# Patient Record
Sex: Male | Born: 1945
Health system: Southern US, Community
[De-identification: ages and names within clinical notes are randomized; demographics above are authoritative.]

## PROBLEM LIST (undated history)

## (undated) DIAGNOSIS — J189 Pneumonia, unspecified organism: Secondary | ICD-10-CM

## (undated) DIAGNOSIS — J302 Other seasonal allergic rhinitis: Secondary | ICD-10-CM

## (undated) DIAGNOSIS — E785 Hyperlipidemia, unspecified: Secondary | ICD-10-CM

## (undated) DIAGNOSIS — R7303 Prediabetes: Secondary | ICD-10-CM

## (undated) DIAGNOSIS — E041 Nontoxic single thyroid nodule: Secondary | ICD-10-CM

## (undated) DIAGNOSIS — F1010 Alcohol abuse, uncomplicated: Secondary | ICD-10-CM

## (undated) DIAGNOSIS — E669 Obesity, unspecified: Secondary | ICD-10-CM

## (undated) DIAGNOSIS — I499 Cardiac arrhythmia, unspecified: Secondary | ICD-10-CM

## (undated) DIAGNOSIS — H9190 Unspecified hearing loss, unspecified ear: Secondary | ICD-10-CM

## (undated) DIAGNOSIS — M1990 Unspecified osteoarthritis, unspecified site: Secondary | ICD-10-CM

## (undated) DIAGNOSIS — T7840XA Allergy, unspecified, initial encounter: Secondary | ICD-10-CM

## (undated) DIAGNOSIS — H269 Unspecified cataract: Secondary | ICD-10-CM

## (undated) DIAGNOSIS — G4730 Sleep apnea, unspecified: Secondary | ICD-10-CM

## (undated) DIAGNOSIS — C801 Malignant (primary) neoplasm, unspecified: Secondary | ICD-10-CM

## (undated) DIAGNOSIS — R600 Localized edema: Secondary | ICD-10-CM

## (undated) DIAGNOSIS — K219 Gastro-esophageal reflux disease without esophagitis: Secondary | ICD-10-CM

## (undated) DIAGNOSIS — I10 Essential (primary) hypertension: Secondary | ICD-10-CM

## (undated) DIAGNOSIS — N400 Enlarged prostate without lower urinary tract symptoms: Secondary | ICD-10-CM

## (undated) DIAGNOSIS — Z9289 Personal history of other medical treatment: Secondary | ICD-10-CM

## (undated) DIAGNOSIS — K648 Other hemorrhoids: Secondary | ICD-10-CM

## (undated) DIAGNOSIS — M19011 Primary osteoarthritis, right shoulder: Secondary | ICD-10-CM

## (undated) DIAGNOSIS — D3613 Benign neoplasm of peripheral nerves and autonomic nervous system of lower limb, including hip: Secondary | ICD-10-CM

## (undated) DIAGNOSIS — E88819 Insulin resistance, unspecified: Secondary | ICD-10-CM

## (undated) DIAGNOSIS — M549 Dorsalgia, unspecified: Secondary | ICD-10-CM

## (undated) DIAGNOSIS — K5730 Diverticulosis of large intestine without perforation or abscess without bleeding: Secondary | ICD-10-CM

## (undated) DIAGNOSIS — Z8709 Personal history of other diseases of the respiratory system: Secondary | ICD-10-CM

## (undated) DIAGNOSIS — R634 Abnormal weight loss: Secondary | ICD-10-CM

## (undated) DIAGNOSIS — R251 Tremor, unspecified: Secondary | ICD-10-CM

## (undated) DIAGNOSIS — K635 Polyp of colon: Secondary | ICD-10-CM

## (undated) DIAGNOSIS — R002 Palpitations: Secondary | ICD-10-CM

## (undated) DIAGNOSIS — M169 Osteoarthritis of hip, unspecified: Secondary | ICD-10-CM

## (undated) DIAGNOSIS — M19112 Post-traumatic osteoarthritis, left shoulder: Secondary | ICD-10-CM

## (undated) DIAGNOSIS — E8881 Metabolic syndrome: Secondary | ICD-10-CM

## (undated) HISTORY — DX: Personal history of other medical treatment: Z92.89

## (undated) HISTORY — DX: Insulin resistance, unspecified: E88.819

## (undated) HISTORY — PX: COLONOSCOPY: SHX174

## (undated) HISTORY — DX: Obesity, unspecified: E66.9

## (undated) HISTORY — DX: Benign neoplasm of peripheral nerves and autonomic nervous system of lower limb, including hip: D36.13

## (undated) HISTORY — DX: Other hemorrhoids: K64.8

## (undated) HISTORY — DX: Alcohol abuse, uncomplicated: F10.10

## (undated) HISTORY — DX: Unspecified cataract: H26.9

## (undated) HISTORY — DX: Polyp of colon: K63.5

## (undated) HISTORY — DX: Dorsalgia, unspecified: M54.9

## (undated) HISTORY — DX: Allergy, unspecified, initial encounter: T78.40XA

## (undated) HISTORY — DX: Sleep apnea, unspecified: G47.30

## (undated) HISTORY — DX: Gastro-esophageal reflux disease without esophagitis: K21.9

## (undated) HISTORY — DX: Unspecified hearing loss, unspecified ear: H91.90

## (undated) HISTORY — DX: Metabolic syndrome: E88.81

## (undated) HISTORY — DX: Unspecified osteoarthritis, unspecified site: M19.90

## (undated) HISTORY — DX: Palpitations: R00.2

## (undated) HISTORY — DX: Hyperlipidemia, unspecified: E78.5

## (undated) HISTORY — DX: Diverticulosis of large intestine without perforation or abscess without bleeding: K57.30

## (undated) HISTORY — PX: JOINT REPLACEMENT: SHX530

## (undated) HISTORY — DX: Localized edema: R60.0

## (undated) HISTORY — PX: TONSILLECTOMY: SUR1361

## (undated) HISTORY — PX: POLYPECTOMY: SHX149

---

## 2004-09-10 ENCOUNTER — Encounter: Payer: Self-pay | Admitting: Family Medicine

## 2004-10-16 ENCOUNTER — Encounter: Payer: Self-pay | Admitting: Family Medicine

## 2004-10-29 ENCOUNTER — Encounter: Payer: Self-pay | Admitting: Family Medicine

## 2007-03-23 HISTORY — PX: SHOULDER SURGERY: SHX246

## 2007-12-18 ENCOUNTER — Encounter: Payer: Self-pay | Admitting: Family Medicine

## 2007-12-18 LAB — HM COLONOSCOPY

## 2008-02-07 LAB — HM COLONOSCOPY

## 2008-05-09 ENCOUNTER — Encounter: Payer: Self-pay | Admitting: Family Medicine

## 2009-09-04 ENCOUNTER — Ambulatory Visit: Payer: Self-pay | Admitting: Family Medicine

## 2009-09-04 DIAGNOSIS — H9190 Unspecified hearing loss, unspecified ear: Secondary | ICD-10-CM | POA: Insufficient documentation

## 2009-09-04 DIAGNOSIS — K648 Other hemorrhoids: Secondary | ICD-10-CM | POA: Insufficient documentation

## 2009-09-04 DIAGNOSIS — G4730 Sleep apnea, unspecified: Secondary | ICD-10-CM | POA: Insufficient documentation

## 2009-09-04 DIAGNOSIS — K5730 Diverticulosis of large intestine without perforation or abscess without bleeding: Secondary | ICD-10-CM | POA: Insufficient documentation

## 2009-09-04 DIAGNOSIS — M1990 Unspecified osteoarthritis, unspecified site: Secondary | ICD-10-CM | POA: Insufficient documentation

## 2009-09-04 DIAGNOSIS — J301 Allergic rhinitis due to pollen: Secondary | ICD-10-CM | POA: Insufficient documentation

## 2009-09-04 DIAGNOSIS — K219 Gastro-esophageal reflux disease without esophagitis: Secondary | ICD-10-CM | POA: Insufficient documentation

## 2009-09-04 LAB — CONVERTED CEMR LAB
Bilirubin Urine: NEGATIVE
Blood in Urine, dipstick: NEGATIVE
Glucose, Urine, Semiquant: NEGATIVE
Ketones, urine, test strip: NEGATIVE
Nitrite: NEGATIVE
Protein, U semiquant: NEGATIVE
Specific Gravity, Urine: <1.005
Urobilinogen, UA: NEGATIVE
WBC Urine, dipstick: NEGATIVE
pH: 7.5

## 2009-09-08 LAB — CONVERTED CEMR LAB
ALT: 17 units/L (ref 0–53)
AST: 17 units/L (ref 0–37)
Albumin: 4.5 g/dL (ref 3.5–5.2)
Alkaline Phosphatase: 72 units/L (ref 39–117)
BUN: 15 mg/dL (ref 6–23)
Basophils Absolute: 0 10*3/uL (ref 0.0–0.1)
Basophils Relative: 0.1 % (ref 0.0–3.0)
Bilirubin, Direct: 0.2 mg/dL (ref 0.0–0.3)
CO2: 30 meq/L (ref 19–32)
Calcium: 9.5 mg/dL (ref 8.4–10.5)
Chloride: 104 meq/L (ref 96–112)
Cholesterol: 126 mg/dL (ref 0–200)
Creatinine, Ser: 0.9 mg/dL (ref 0.4–1.5)
Eosinophils Absolute: 0.1 10*3/uL (ref 0.0–0.7)
Eosinophils Relative: 0.9 % (ref 0.0–5.0)
GFR calc non Af Amer: 87.12 mL/min (ref >60)
Glucose, Bld: 81 mg/dL (ref 70–99)
HCT: 45.1 % (ref 39.0–52.0)
HDL: 41.8 mg/dL (ref >39.00)
Hemoglobin: 15.3 g/dL (ref 13.0–17.0)
LDL Cholesterol: 72 mg/dL (ref 0–99)
Lymphocytes Relative: 26.5 % (ref 12.0–46.0)
Lymphs Abs: 1.9 10*3/uL (ref 0.7–4.0)
MCHC: 34 g/dL (ref 30.0–36.0)
MCV: 91.2 fL (ref 78.0–100.0)
Monocytes Absolute: 0.7 10*3/uL (ref 0.1–1.0)
Monocytes Relative: 9.1 % (ref 3.0–12.0)
Neutro Abs: 4.5 10*3/uL (ref 1.4–7.7)
Neutrophils Relative %: 63.4 % (ref 43.0–77.0)
PSA: 0.8 ng/mL (ref 0.10–4.00)
Platelets: 187 10*3/uL (ref 150.0–400.0)
Potassium: 4.1 meq/L (ref 3.5–5.1)
RBC: 4.95 M/uL (ref 4.22–5.81)
RDW: 14 % (ref 11.5–14.6)
Sodium: 142 meq/L (ref 135–145)
TSH: 0.53 microintl units/mL (ref 0.35–5.50)
Total Bilirubin: 1.1 mg/dL (ref 0.3–1.2)
Total CHOL/HDL Ratio: 3
Total Protein: 7.2 g/dL (ref 6.0–8.3)
Triglycerides: 61 mg/dL (ref 0.0–149.0)
VLDL: 12.2 mg/dL (ref 0.0–40.0)
WBC: 7.1 10*3/uL (ref 4.5–10.5)

## 2009-10-23 ENCOUNTER — Telehealth: Payer: Self-pay | Admitting: Family Medicine

## 2009-10-24 ENCOUNTER — Telehealth (INDEPENDENT_AMBULATORY_CARE_PROVIDER_SITE_OTHER): Payer: Self-pay | Admitting: *Deleted

## 2009-10-27 ENCOUNTER — Telehealth (INDEPENDENT_AMBULATORY_CARE_PROVIDER_SITE_OTHER): Payer: Self-pay | Admitting: *Deleted

## 2009-10-31 ENCOUNTER — Encounter: Payer: Self-pay | Admitting: Family Medicine

## 2010-04-21 NOTE — Progress Notes (Signed)
Summary: REFILL REQUEST  Phone Note Refill Request Call back at [phone] Message from:  Pharmacy on October 23, 2009 3:26 PM  Refills Requested: Medication #1:  MOBIC 15 MG TABS 1 by mouth once daily.   Dosage confirmed as above?Dosage Confirmed   Supply Requested: 3 months   Last Refilled: 06/18/2009  Medication #2:  LIPITOR 40 MG TABS 1 by mouth daily   Dosage confirmed as above?Dosage Confirmed   Supply Requested: 3 months   Last Refilled: 06/18/2009  Medication #3:  NEXIUM 40 MG CPDR 1 by mouth daily   Dosage confirmed as above?Dosage Confirmed   Supply Requested: 3 months   Last Refilled: 07/19/2009 [facility] [address] [place]   Next Appointment Scheduled: NONE Initial call taken by: Lavell Islam,  October 23, 2009 3:30 PM    Prescriptions: MOBIC 15 MG TABS (MELOXICAM) 1 by mouth once daily  #30 x 5   Entered by:   Shonna Chock CMA   Authorized by:   Loreen Freud DO   Signed by:   Shonna Chock CMA on 10/24/2009   Method used:   Electronically to        [facility]* (retail)       [address]       [place], [place]  [address]       Ph: [phone]       Fax: [phone]   RxID:   [id number] NEXIUM 40 MG CPDR (ESOMEPRAZOLE MAGNESIUM) 1 by mouth daily  #30 x 11   Entered by:   Shonna Chock CMA   Authorized by:   Loreen Freud DO   Signed by:   Shonna Chock CMA on 10/24/2009   Method used:   Electronically to        [facility]* (retail)       [address]       [place], [place]  [address]       Ph: [phone]       Fax: [phone]   RxID:   [id number] LIPITOR 40 MG TABS (ATORVASTATIN CALCIUM) 1 by mouth daily  #30 x 11   Entered by:   Shonna Chock CMA   Authorized by:   Loreen Freud DO   Signed by:   Shonna Chock CMA on 10/24/2009   Method used:   Electronically to        [facility]* (retail)       [address]       [place], [place]  [address]       Ph: [phone]       Fax: [phone]   RxID:    [id number]

## 2010-04-21 NOTE — Procedures (Signed)
Summary: Colonoscopy/[facility]  [facility]   Imported By: Lanelle Bal 09/18/2009 13:13:22  _____________________________________________________________________  External Attachment:    Type:   Image     Comment:   External Document

## 2010-04-21 NOTE — Progress Notes (Signed)
Summary: Prior Auth APPROVED NEXIUM  Phone Note Refill Request Message from:  Pharmacy on October 27, 2009 9:02 AM  Refills Requested: Medication #1:  NEXIUM 40 MG CPDR 1 by mouth daily   Dosage confirmed as above?Dosage Confirmed   Supply Requested: 3 months PRIOR AUTH NEEDED: [phone]  Initial call taken by: Lavell Islam,  October 27, 2009 9:15 AM  Follow-up for Phone Call        Before I initiate prior authorization, has the patient tried and failed Omeprazole? Lucious Groves CMA  October 27, 2009 4:35 PM   Additional Follow-up for Phone Call Additional follow up Details #1::        probably not---he was on nexium only---but double check with pt Additional Follow-up by: Loreen Freud DO,  October 27, 2009 4:46 PM    Additional Follow-up for Phone Call Additional follow up Details #2::    Left message on machine to call back to office. Lucious Groves CMA  October 28, 2009 11:07 AM   pt return call has tried several other med such as zantac, prilosec, prevacid.................Marland KitchenFelecia Deloach CMA  October 28, 2009 11:32 AM   PRIOR AUTH APPROVED 10-24-09 UNTIL 10-25-10. LEFT DETAIL MESSAGE WITH PHARMACY [place] PROCESS CLAIM.......................Marland KitchenFelecia Deloach CMA  October 29, 2009 8:25 AM

## 2010-04-21 NOTE — Medication Information (Signed)
Summary: Approval for Nexium/BCBS  Approval for Nexium/BCBS   Imported By: Lanelle Bal 11/14/2009 08:18:37  _____________________________________________________________________  External Attachment:    Type:   Image     Comment:   External Document

## 2010-04-21 NOTE — Letter (Signed)
Summary: Glendora Score MD  Glendora Score MD   Imported By: Lanelle Bal 09/18/2009 13:11:54  _____________________________________________________________________  External Attachment:    Type:   Image     Comment:   External Document

## 2010-04-21 NOTE — Assessment & Plan Note (Signed)
Summary: NEW TO ESTABLIS--BCBS/TRICARE--PH   Vital Signs:  Patient profile:   65 year old male Height:      70 inches Weight:      261.13 pounds BMI:     37.60 Pulse rate:   75 / minute Pulse rhythm:   regular BP sitting:   126 / 84  (left arm) Cuff size:   large  Vitals Entered By: Army Fossa CMA (September 04, 2009 10:01 AM)  History of Present Illness: Pt here to establish and have cpe and labs.  No complaints.   Preventive Screening-Counseling & Management  Alcohol-Tobacco     Alcohol drinks/day: <1     Alcohol type: beer     Smoking Status: never  Caffeine-Diet-Exercise     Caffeine use/day: 2-3     Diet Comments: needs work     Does Patient Exercise: no     Exercise Counseling: to improve exercise regimen  Hep-HIV-STD-Contraception     Dental Visit-last 6 months yes     Dental Care Counseling: not indicated; dental care within six months      Sexual History:  currently monogamous and married.        Drug Use:  no.    Current Medications (verified): 1)  Lipitor 40 Mg Tabs (Atorvastatin Calcium) .[name].. 1 By Mouth Daily 2)  Nexium 40 Mg Cpdr (Esomeprazole Magnesium) .[name].. 1 By Mouth Daily 3)  Aspir-Low 81 Mg Tbec (Aspirin) .[name].. 1 By Mouth Daily 4)  Mvi 5)  Eql Coq10 100 Mg Caps (Coenzyme Q10) .[name].. 1 By Mouth Daily 6)  Mobic 15 Mg Tabs (Meloxicam) .[name].. 1 By Mouth Once Daily  Allergies (verified): No Known Drug Allergies  Past History:  Family History: Last updated: 09/04/2009 Heart Disease Family History of Arthritis Family History Breast cancer 1st degree relative <50 Family History High cholesterol Family History Hypertension Family History of Prostate CA 1st degree relative 64 yo  Social History: Last updated: 09/04/2009 Retired-- [employer], [place] Married Never Smoked Alcohol use-yes Drug use-no Regular exercise-no  Risk Factors: Alcohol Use: <1 (09/04/2009) Caffeine Use: 2-3 (09/04/2009) Diet: needs work (09/04/2009) Exercise: no  (09/04/2009)  Risk Factors: Smoking Status: never (09/04/2009)  Past Medical History: Current Problems:  INTERNAL HEMORRHOIDS WITHOUT MENTION COMP (ICD-455.0) OSTEOARTHRITIS (ICD-715.90) FAMILY HISTORY BREAST CANCER 1ST DEGREE RELATIVE <50 (ICD-V16.3) GERD High Cholesterol  Polpys in Colon Osteoarthritis Diverticulosis, colon  Past Surgical History: Tonsillectomy Shoulder surgery, right  2009  Family History: Reviewed history and no changes required. Heart Disease Family History of Arthritis Family History Breast cancer 1st degree relative <50 Family History High cholesterol Family History Hypertension Family History of Prostate CA 1st degree relative 64 yo  Social History: Reviewed history and no changes required. 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 Eyes:  Denies blurring, discharge, double vision, eye irritation, eye pain, halos, itching, light sensitivity, red eye, vision loss-1 eye, and vision loss-both eyes;  + glasses -- optho-- q1y. ENT:  Denies decreased hearing, difficulty swallowing, ear discharge, earache, hoarseness, nasal congestion, nosebleeds, postnasal drainage, ringing in ears, sinus pressure, and sore throat. CV:  Denies bluish discoloration of lips or nails, chest pain  or discomfort, difficulty breathing at night, difficulty breathing while lying down, fainting, fatigue, leg cramps with exertion, lightheadness, near fainting, palpitations, shortness of breath with exertion, swelling of feet, swelling of hands, and weight gain. Resp:  Denies chest discomfort, chest pain with inspiration,  cough, coughing up blood, excessive snoring, hypersomnolence, morning headaches, pleuritic, shortness of breath, sputum productive, and wheezing. GU:  Denies decreased libido, discharge, dysuria, erectile dysfunction, genital sores, hematuria, incontinence, nocturia, urinary frequency, and urinary hesitancy. MS:  Complains of joint pain; denies joint redness, joint swelling, loss of strength, low back pain, mid back pain, muscle aches, muscle , cramps, muscle weakness, stiffness, and thoracic pain. Derm:  Denies changes in color of skin, changes in nail beds, dryness, excessive perspiration, flushing, hair loss, insect bite(s), itching, lesion(s), poor wound healing, and rash. Neuro:  Denies brief paralysis, difficulty with concentration, disturbances in coordination, falling down, headaches, inability to speak, memory loss, numbness, poor balance, seizures, sensation of room spinning, tingling, tremors, visual disturbances, and weakness. Psych:  Denies alternate hallucination ( auditory/visual), anxiety, depression, easily angered, easily tearful, irritability, mental problems, panic attacks, sense of great danger, suicidal thoughts/plans, thoughts of violence, unusual visions or sounds, and thoughts /plans of harming others. Endo:  Denies cold intolerance, excessive hunger, excessive thirst, excessive urination, heat intolerance, polyuria, and weight change. Heme:  Denies abnormal bruising, bleeding, enlarge lymph nodes, fevers, pallor, and skin discoloration. Allergy:  Denies hives or rash, itching eyes, persistent infections, seasonal allergies, and sneezing.  Physical Exam  General:  Well-developed,well-nourished,in no acute distress; alert,appropriate and cooperative throughout examination Head:  Normocephalic and atraumatic without obvious abnormalities. No apparent alopecia or balding. Eyes:  pupils equal, pupils round, pupils reactive to light, and no injection.   Ears:  External ear exam  shows no significant lesions or deformities.  Otoscopic examination reveals clear canals, tympanic membranes are intact bilaterally without bulging, retraction, inflammation or discharge. Hearing is grossly normal bilaterally. Nose:  External nasal examination shows no deformity or inflammation. Nasal mucosa are pink and moist without lesions or exudates. Mouth:  Oral mucosa and oropharynx without lesions or exudates.  Teeth in good repair. Neck:  No deformities, masses, or tenderness noted.no carotid bruits.   Chest Wall:  No deformities, masses, tenderness or gynecomastia noted. Lungs:  Normal respiratory effort, chest expands symmetrically. Lungs are clear to auscultation, no crackles or wheezes. Heart:  normal rate and no murmur.   Abdomen:  Bowel sounds positive,abdomen soft and non-tender without masses, organomegaly or hernias noted. Rectal:  No external abnormalities noted. Normal sphincter tone. No rectal masses or tenderness. heme neg brown stool Genitalia:  Testes bilaterally descended without nodularity, tenderness or masses. No scrotal masses or lesions. No penis lesions or urethral discharge. Prostate:  no nodules, no asymmetry, no induration, and 1+ enlarged.   Msk:  dec rom R shoulder othewise rom other ext normal Pulses:  R posterior tibial normal, R dorsalis pedis normal, R carotid normal, L popliteal normal, L posterior tibial normal, and L dorsalis pedis normal.   Extremities:  No clubbing, cyanosis, edema, or deformity noted with normal full range of motion of all joints.   Neurologic:  alert & oriented X3, cranial nerves II-XII intact, and gait normal.   Skin:  Intact without suspicious lesions or rashes Cervical Nodes:  No lymphadenopathy noted Psych:  Oriented X3, normally interactive, good eye contact, not anxious appearing, and not depressed appearing.     Impression & Recommendations:  Problem # 1:  PREVENTIVE HEALTH CARE (ICD-V70.0)  Orders: Venipuncture  (83151) TLB-Lipid Panel (80061-LIPID) TLB-BMP (Basic Metabolic Panel-BMET) (80048-METABOL) TLB-CBC Platelet - w/Differential (85025-CBCD) TLB-Hepatic/Liver Function Pnl (80076-HEPATIC) TLB-TSH (Thyroid Stimulating Hormone) (84443-TSH) TLB-PSA (Prostate Specific Antigen) (84153-PSA) UA Dipstick w/o Micro (manual) (76160) EKG w/ Interpretation (93000) UA Dipstick w/o Micro (manual) (73710)  Reviewed preventive care protocols, scheduled due services, and updated immunizations.  Problem # 2:  GERD (ICD-530.81)  His updated medication list for this problem includes:    Nexium 40 Mg Cpdr (Esomeprazole magnesium) .[name]... 1 by mouth daily  Orders: Venipuncture (62694) TLB-Lipid Panel (80061-LIPID) TLB-BMP (Basic Metabolic Panel-BMET) (80048-METABOL) TLB-CBC Platelet - w/Differential (85025-CBCD) TLB-Hepatic/Liver Function Pnl (80076-HEPATIC) TLB-TSH (Thyroid Stimulating Hormone) (84443-TSH) TLB-PSA (Prostate Specific Antigen) (84153-PSA)  Diagnostics Reviewed:  Discussed lifestyle modifications, diet, antacids/medications, and preventive measures. Handout provided.   Problem # 3:  SLEEP APNEA (ICD-780.57)  Orders: Venipuncture (85462) TLB-Lipid Panel (80061-LIPID) TLB-BMP (Basic Metabolic Panel-BMET) (80048-METABOL) TLB-CBC Platelet - w/Differential (85025-CBCD) TLB-Hepatic/Liver Function Pnl (80076-HEPATIC) TLB-TSH (Thyroid Stimulating Hormone) (84443-TSH) TLB-PSA (Prostate Specific Antigen) (84153-PSA)  CPAP (cm H20):   Mask Type:   Mask Size:   Problem # 4:  OSTEOARTHRITIS (ICD-715.90) offered PT ---pt wanted to wait His updated medication list for this problem includes:    Aspir-low 81 Mg Tbec (Aspirin) .[name]... 1 by mouth daily    Mobic 15 Mg Tabs (Meloxicam) .[name]... 1 by mouth once daily  Orders: Venipuncture (70350) TLB-Lipid Panel (80061-LIPID) TLB-BMP (Basic Metabolic Panel-BMET) (80048-METABOL) TLB-CBC Platelet - w/Differential (85025-CBCD) TLB-Hepatic/Liver Function  Pnl (80076-HEPATIC) TLB-TSH (Thyroid Stimulating Hormone) (84443-TSH) TLB-PSA (Prostate Specific Antigen) (84153-PSA)  Discussed use of medications, application of heat or cold, and exercises.   Complete Medication List: 1)  Lipitor 40 Mg Tabs (Atorvastatin calcium) .[name].. 1 by mouth daily 2)  Nexium 40 Mg Cpdr (Esomeprazole magnesium) .[name].. 1 by mouth daily 3)  Aspir-low 81 Mg Tbec (Aspirin) .[name].. 1 by mouth daily 4)  Mvi  5)  Eql Coq10 100 Mg Caps (Coenzyme q10) .[name].. 1 by mouth daily 6)  Mobic 15 Mg Tabs (Meloxicam) .[name].. 1 by mouth once daily   EKG  Procedure date:  09/04/2009  Findings:      Sinus bradycardia with rate of:  57bpm    Flu Vaccine Result Date:  01/01/2009 Flu Vaccine Result:  given Flu Vaccine Next Due:  1 yr TD Result Date:  05/01/2008 TD Result:  given TD Next Due:  10 yr Colonoscopy Result Date:  02/07/2008 Colonoscopy Result:  diverticulosis, hemrrhoids Colonoscopy Next Due:  5 yr  Laboratory Results   Urine Tests   Date/Time Reported: September 04, 2009 12:44 PM   Routine Urinalysis   Color: yellow Appearance: Clear Glucose: negative   (Normal Range: Negative) Bilirubin: negative   (Normal Range: Negative) Ketone: negative   (Normal Range: Negative) Spec. Gravity: <1.005   (Normal Range: 1.003-1.035) Blood: negative   (Normal Range: Negative) pH: 7.5   (Normal Range: 5.0-8.0) Protein: negative   (Normal Range: Negative) Urobilinogen: negative   (Normal Range: 0-1) Nitrite: negative   (Normal Range: Negative) Leukocyte Esterace: negative   (Normal Range: Negative)    Comments: Floydene Flock  September 04, 2009 12:44 PM'

## 2010-04-21 NOTE — Letter (Signed)
Summary: Records Dated 12-05-95 thru 11-19-07/[facility]  Records Dated 12-05-95 thru 11-19-07/[facility]   Imported By: Lanelle Bal 09/17/2009 11:10:54  _____________________________________________________________________  External Attachment:    Type:   Image     Comment:   External Document

## 2010-04-21 NOTE — Progress Notes (Signed)
Summary: refill for 90 day supply  Phone Note Refill Request Message from:  Fax from Pharmacy  Refills Requested: Medication #1:  MOBIC 15 MG TABS 1 by mouth once daily.  Medication #2:  LIPITOR 40 MG TABS 1 by mouth daily  Medication #3:  NEXIUM 40 MG CPDR 1 by mouth daily refill sent to [facility] - fax [phone] for 30 day supply ---- - patient wants 90 day supply  Initial call taken by: Okey Regal [place],  October 24, 2009 3:52 PM    Prescriptions: MOBIC 15 MG TABS (MELOXICAM) 1 by mouth once daily  #90 x 1   Entered by:   Jeremy Johann CMA   Authorized by:   Loreen Freud DO   Signed by:   Jeremy Johann CMA on 10/24/2009   Method used:   Re-Faxed to ...       [facility]* (retail)       [address]       [place], [place]  [address]       Ph: [phone]       Fax: [phone]   RxID:   [id number] NEXIUM 40 MG CPDR (ESOMEPRAZOLE MAGNESIUM) 1 by mouth daily  #90 x 4   Entered by:   Jeremy Johann CMA   Authorized by:   Loreen Freud DO   Signed by:   Jeremy Johann CMA on 10/24/2009   Method used:   Re-Faxed to ...       [facility]* (retail)       [address]       [place], [place]  [address]       Ph: [phone]       Fax: [phone]   RxID:   [id number] LIPITOR 40 MG TABS (ATORVASTATIN CALCIUM) 1 by mouth daily  #90 x 4   Entered by:   Jeremy Johann CMA   Authorized by:   Loreen Freud DO   Signed by:   Jeremy Johann CMA on 10/24/2009   Method used:   Re-Faxed to ...       [facility]* (retail)       [address]       [place], [place]  [address]       Ph: [phone]       Fax: [phone]   RxID:   [id number]

## 2010-04-21 NOTE — Procedures (Signed)
Summary: Colonoscopy/[facility]  Colonoscopy/[facility]   Imported By: Lanelle Bal 09/18/2009 13:15:43  _____________________________________________________________________  External Attachment:    Type:   Image     Comment:   External Document

## 2010-04-21 NOTE — Letter (Signed)
Summary: Glendora Score MD  Glendora Score MD   Imported By: Lanelle Bal 09/18/2009 13:12:33  _____________________________________________________________________  External Attachment:    Type:   Image     Comment:   External Document

## 2010-04-21 NOTE — Progress Notes (Signed)
Summary: Med & Problem List Brought by Patient  Med & Problem List Brought by Patient   Imported By: Lanelle Bal 09/09/2009 08:47:44  _____________________________________________________________________  External Attachment:    Type:   Image     Comment:   External Document

## 2010-07-22 ENCOUNTER — Other Ambulatory Visit: Payer: Self-pay

## 2010-07-22 NOTE — Telephone Encounter (Signed)
Last seen 09/04/2009 and filled  04/20/10 please advise    KP

## 2010-07-23 MED ORDER — MELOXICAM 15 MG PO TABS: 15.0000 mg | ORAL_TABLET | Freq: Every day | ORAL | Status: DC

## 2010-07-23 NOTE — Telephone Encounter (Signed)
Rx faxed.    KP 

## 2010-08-27 ENCOUNTER — Other Ambulatory Visit: Payer: Self-pay | Admitting: *Deleted

## 2010-08-27 NOTE — Telephone Encounter (Signed)
Verbal ok given and advised receptionist paperwork has not been received as of yet and we still need to have paperwork faxed as well so that Dr lowne can sign off on orders. 

## 2010-08-27 NOTE — Telephone Encounter (Signed)
Ok to give verbal 

## 2010-08-27 NOTE — Telephone Encounter (Signed)
Verbal Ok to fill med today, otherwise will wait until fax forms are completed and faxed back for Pt CMN- CPAP equipment.

## 2010-10-07 ENCOUNTER — Encounter: Payer: Self-pay | Admitting: Family Medicine

## 2010-10-08 ENCOUNTER — Encounter: Payer: Self-pay | Admitting: *Deleted

## 2010-10-08 ENCOUNTER — Ambulatory Visit (INDEPENDENT_AMBULATORY_CARE_PROVIDER_SITE_OTHER): Payer: Federal, State, Local not specified - PPO | Admitting: Family Medicine

## 2010-10-08 ENCOUNTER — Encounter: Payer: Self-pay | Admitting: Family Medicine

## 2010-10-08 VITALS — BP 124/82 | HR 54 | Temp 97.3°F | Ht 70.0 in | Wt 280.0 lb

## 2010-10-08 DIAGNOSIS — E785 Hyperlipidemia, unspecified: Secondary | ICD-10-CM

## 2010-10-08 DIAGNOSIS — Z0000 Encounter for general adult medical examination without abnormal findings: Secondary | ICD-10-CM

## 2010-10-08 DIAGNOSIS — Z01818 Encounter for other preprocedural examination: Secondary | ICD-10-CM | POA: Insufficient documentation

## 2010-10-08 DIAGNOSIS — M1990 Unspecified osteoarthritis, unspecified site: Secondary | ICD-10-CM

## 2010-10-08 DIAGNOSIS — K219 Gastro-esophageal reflux disease without esophagitis: Secondary | ICD-10-CM

## 2010-10-08 DIAGNOSIS — Z2911 Encounter for prophylactic immunotherapy for respiratory syncytial virus (RSV): Secondary | ICD-10-CM

## 2010-10-08 LAB — POCT URINALYSIS DIPSTICK
Bilirubin, UA: NEGATIVE
Blood, UA: NEGATIVE
Glucose, UA: NEGATIVE
Ketones, UA: NEGATIVE
Leukocytes, UA: NEGATIVE
Nitrite, UA: NEGATIVE
Protein, UA: NEGATIVE
Spec Grav, UA: 1.01
Urobilinogen, UA: 0.2
pH, UA: 6

## 2010-10-08 LAB — LIPID PANEL
Cholesterol: 131 mg/dL (ref 0–200)
HDL: 49.8 mg/dL (ref >39.00)
LDL Cholesterol: 63 mg/dL (ref 0–99)
Total CHOL/HDL Ratio: 3
Triglycerides: 93 mg/dL (ref 0.0–149.0)
VLDL: 18.6 mg/dL (ref 0.0–40.0)

## 2010-10-08 LAB — HEPATIC FUNCTION PANEL
ALT: 21 U/L (ref 0–53)
AST: 19 U/L (ref 0–37)
Albumin: 4.4 g/dL (ref 3.5–5.2)
Alkaline Phosphatase: 63 U/L (ref 39–117)
Bilirubin, Direct: 0.1 mg/dL (ref 0.0–0.3)
Total Bilirubin: 0.8 mg/dL (ref 0.3–1.2)
Total Protein: 7.3 g/dL (ref 6.0–8.3)

## 2010-10-08 LAB — CBC WITH DIFFERENTIAL/PLATELET
Basophils Absolute: 0 10*3/uL (ref 0.0–0.1)
Basophils Relative: 0.4 % (ref 0.0–3.0)
Eosinophils Absolute: 0.1 10*3/uL (ref 0.0–0.7)
Eosinophils Relative: 0.9 % (ref 0.0–5.0)
HCT: 44.1 % (ref 39.0–52.0)
Hemoglobin: 15 g/dL (ref 13.0–17.0)
Lymphocytes Relative: 27.1 % (ref 12.0–46.0)
Lymphs Abs: 1.9 10*3/uL (ref 0.7–4.0)
MCHC: 33.9 g/dL (ref 30.0–36.0)
MCV: 91.4 fl (ref 78.0–100.0)
Monocytes Absolute: 0.5 10*3/uL (ref 0.1–1.0)
Monocytes Relative: 7.9 % (ref 3.0–12.0)
Neutro Abs: 4.4 10*3/uL (ref 1.4–7.7)
Neutrophils Relative %: 63.7 % (ref 43.0–77.0)
Platelets: 185 10*3/uL (ref 150.0–400.0)
RBC: 4.83 Mil/uL (ref 4.22–5.81)
RDW: 13.7 % (ref 11.5–14.6)
WBC: 6.8 10*3/uL (ref 4.5–10.5)

## 2010-10-08 LAB — BASIC METABOLIC PANEL
BUN: 19 mg/dL (ref 6–23)
CO2: 30 mEq/L (ref 19–32)
Calcium: 9.3 mg/dL (ref 8.4–10.5)
Chloride: 103 mEq/L (ref 96–112)
Creatinine, Ser: 1.1 mg/dL (ref 0.4–1.5)
GFR: 75.47 mL/min (ref >60.00)
Glucose, Bld: 103 mg/dL — ABNORMAL HIGH (ref 70–99)
Potassium: 4.4 mEq/L (ref 3.5–5.1)
Sodium: 139 mEq/L (ref 135–145)

## 2010-10-08 LAB — TSH: TSH: 0.51 u[IU]/mL (ref 0.35–5.50)

## 2010-10-08 LAB — PSA: PSA: 0.76 ng/mL (ref 0.10–4.00)

## 2010-10-08 NOTE — Assessment & Plan Note (Signed)
con't mobic 

## 2010-10-08 NOTE — Assessment & Plan Note (Signed)
con't med Check labs 

## 2010-10-08 NOTE — Assessment & Plan Note (Signed)
ghm utd Check labs  

## 2010-10-08 NOTE — Progress Notes (Signed)
  Subjective:    Patient ID: Stone Spirito, male    DOB: 07-05-1945, 65 y.o.   MRN: [id number]  HPI Pt here for cpe and labs.  No complaints.     Review of Systems  Review of Systems  Constitutional: Negative for activity change, appetite change and fatigue.  HENT: Negative for hearing loss, congestion, tinnitus and ear discharge.   Eyes: Negative for visual disturbance (see optho q2y -- vision corrected to 20/20 with glasses).  Respiratory: Negative for cough, chest tightness and shortness of breath.   Cardiovascular: Negative for chest pain, palpitations and leg swelling.  Gastrointestinal: Negative for abdominal pain, diarrhea, constipation and abdominal distention.  Genitourinary: Negative for urgency, frequency, decreased urine volume and difficulty urinating.  Musculoskeletal: Negative for back pain, arthralgias and gait problem.  Skin: Negative for color change, pallor and rash.  Neurological: Negative for dizziness, light-headedness, numbness and headaches.  Hematological: Negative for adenopathy. Does not bruise/bleed easily.  Psychiatric/Behavioral: Negative for suicidal ideas, confusion, sleep disturbance, self-injury, dysphoric mood, decreased concentration and agitation.      Objective:   Physical Exam    BP 124/82  Pulse 54  Temp(Src) 97.3 F (36.3 C) (Oral)  Ht 5\' 10"  (1.778 m)  Wt 280 lb (127.007 kg)  BMI 40.18 kg/m2  SpO2 97% General appearance: alert, cooperative, appears stated age and no distress Head: Normocephalic, without obvious abnormality, atraumatic Eyes: conjunctivae/corneas clear. PERRL, EOM's intact. Fundi benign. Ears: normal TM's and external ear canals both ears Nose: Nares normal. Septum midline. Mucosa normal. No drainage or sinus tenderness. Throat: lips, mucosa, and tongue normal; teeth and gums normal Neck: no adenopathy, no carotid bruit, no JVD, supple, symmetrical, trachea midline and thyroid not enlarged, symmetric, no  tenderness/mass/nodules Lungs: clear to auscultation bilaterally Chest wall: no tenderness Heart: regular rate and rhythm, S1, S2 normal, no murmur, click, rub or gallop Abdomen: soft, non-tender; bowel sounds normal; no masses,  no organomegaly Male genitalia: normal, penis: no lesions or discharge. testes: no masses or tenderness. no hernias Rectal: normal tone, normal prostate, no masses or tenderness Extremities: extremities normal, atraumatic, no cyanosis or edema Pulses: 2+ and symmetric Skin: Skin color, texture, turgor normal. No rashes or lesions Lymph nodes: Cervical, supraclavicular, and axillary nodes normal. Neurologic: Alert and oriented X 3, normal strength and tone. Normal symmetric reflexes. Normal coordination and gait Psych--- no depression, no anxiety    Assessment & Plan:

## 2010-10-08 NOTE — Assessment & Plan Note (Signed)
Cont nexium Symptoms controlled

## 2010-10-08 NOTE — Patient Instructions (Signed)
Preventative Care for Adults, Male   MAINTAIN REGULAR HEALTH EXAMS   A routine yearly physical is a good way to check in with your primary care provider about your health and preventive screening. It is also an opportunity to share updates about your health and any concerns you have and receive a thorough all-over exam.   If you smoke or chew tobacco, find out from your caregiver how to quit. It can literally save your life, no matter how long you have been a tobacco user. If you do not use tobacco, never begin.   Maintain a healthy diet and normal weight. Increased weight leads to problems with blood pressure and diabetes. Decrease saturated fat in the diet and increase regular exercise. Get information about proper diet from your caregiver if necessary. Eat a variety of foods, including fruit, vegetables, animal or vegetable protein, such as meat, fish, chicken, and eggs, or beans, lentils, tofu, and grains, such as rice.   High blood pressure causes heart and blood vessel problems. Fat leaves deposits in your arteries that can block them. This causes heart disease and vessel disease elsewhere in your body. Check your blood pressure regularly and keep it within normal limits. Men over age 50 or those who have a family history of high blood pressure should have it checked at least every year.   Aerobic exercise helps maintain good heart health. 30 minutes of moderate-intensity exercise is recommended. For example, a brisk walk that increases your heart rate and breathing. This walk should be done on most days of the week. Persistent high blood pressure should be treated with medicine if weight loss and exercise do not work.   For many men aged 20 and older, having a cholesterol test of the blood every 5 years is recommended. If your cholesterol is found to be borderline high, or if you have heart disease or certain other medical conditions, then you may need to have it monitored more frequently.   Avoid smoking,  drinking alcohol in excess (more than two drinks per day) or use of street drugs. Do not share needles with anyone. Ask for professional help if you need assistance or instructions on stopping the use of alcohol, cigarettes, and/or drugs.   Maintain normal blood lipids and cholesterol by minimizing your intake of saturated fat. Eat a well rounded diet otherwise, with plenty of fruit and vegetables. The National Institutes of Health encourage men to eat 5-9 servings of fruit and vegetables each day. Your caregiver can help you keep your risk of heart disease or stroke at a lower level.   Ask your caregiver if you are in need of earlier testing because of: a strong family history of heart disease, or you have signs of elevated testosterone (male sex hormone) levels. This can predispose you to earlier heart disease. Ask if you should have a stress test if your history suggests this. A stress test is a test done on a treadmill that looks for heart disease. This test can find disease prior to there being a problem.   Diabetes screening assesses your blood sugar level after a fasting once every 3 years after age 45 if previous tests were normal.   Most routine colon cancer screening begins at the age of 50. On a yearly basis, doctors may provide special easy to use take-home tests to check for hidden blood in the stool. Sigmoidoscopy or colonoscopy can detect the earliest forms of colon cancer and is life saving. These test use a   small camera at the end of a tube to directly examine the colon. Speak to your caregiver about this at age 50, when routine screening begins (and is repeated every 5 years unless early forms of pre-cancerous polyps or small growths are found).   At the age of 50 men usually start screening for prostate cancer every year. Screening may begin at a younger age for those with higher risk. Those at higher risk include African-Americans or having a family history of prostate cancer. There are two types  of tests for prostate cancer:   Prostate-specific antigen (PSA) testing. Recent studies raise questions about prostate cancer using PSA and you should discuss this with your caregiver.   Digital rectal exam (in which your doctor’s lubricated and gloved finger feels for enlargement of the prostate through the anus).   Practice safe sex. Use condoms. Condoms are used for birth control and to help reduce the spread of sexually transmitted infections (or STIs). Unsafe sex is having an unprotected physical relationship with someone who is bisexual, homosexual, uses intravenous street drugs, or going with someone who has sexual relations with high-risk groups. Practicing safe sex helps you avoid getting an STI. Some of the STIs are gonorrhea (the clap), chlamydia, syphilis, trichimonas, herpes, HPV (human papiloma virus) and HIV (human immunodeficiency virus) which causes AIDS. The herpes, HIV and HPV are viral illnesses that have no cure. These can result in disability, cancer and death.   It is not safe for someone who has AIDS or is HIV positive to have unprotected sex with someone else who is positive. The reason for this is the fact that there are many different strains of HIV. If you have a strain that is readily treated with medications and then suddenly introduce a strain from a partner that has no further treatment options, you may suddenly have a strain of HIV that is untreatable. Even if you are both positive for HIV, it is still necessary to practice safe sex.   Use sunscreen with a SPF (or skin protection factor) of 15 or greater. Apply sunscreen liberally and repeatedly throughout the day. Being outside in the sun when your shadow caused by the sun is shorter than you are, means you are being exposed to sun at greater intensity. Lighter skinned people are at a greater risk of skin cancer. Don’t forget to also wear sunglasses in order to protect your eyes from too much damaging sunlight. Damaging sunlight can  accelerate cataract formation.   Once a month do a whole body skin exam or review, using a mirror to look at your back. Notify your caregivers of changes in moles, especially if there are changes in shapes, colors, a size larger than a pencil eraser, an irregular border, or development of new moles.   Keep carbon monoxide and smoke detectors in your home functioning at all times. Change the batteries every 6 months or use a model that plugs into the wall.   Do a monthly exam of your testicles. Gently roll each testicle between your thumb and fingers, feeling for any abnormal lumps. The best time to do this is after a hot shower or bath when the tissues are looser. Notify your caregivers of any lumps, tenderness or changes in size or shape immediately.   Stay up to date with your tetanus shots and other required immunizations. You should have a booster for tetanus every 10 years. Be sure to get your flu shot every year, since 5%-20% of the [place] population   comes down with the flu. The flu vaccine changes each year, so being vaccinated once is not enough. Get your shot in the fall, before the flu season peaks. The table below lists important vaccines to get. Other vaccines to consider include:   Hepatitis A virus (to prevent a form of infection of the liver by a virus acquired from food), Varicella Zoster (a virus that causes shingles).   Meningococcal (against bacteria which cause a form of meningitis).   Brush your teeth twice a day with fluoride toothpaste, and floss once a day. Good oral hygiene prevents tooth decay and gum disease. The problems can be painful, unattractive, and can cause other health problems. Visit your dentist for a routine oral and dental check up and preventive care every 6-12 months.   The Body Mass Index or BMI is a way of measuring how much of your body is fat. Having a BMI above 27 increases the risk of heart disease, diabetes, hypertension, stroke and other problems related to obesity.  Your caregiver can help determine your BMI and based on it develop an exercise and dietary program to help you achieve or maintain this important measurement at a healthful level.   Wear seat belts whenever in a vehicle, whether a passenger or driver, and even for very short drives of a few minutes.   If you bicycle, wear a helmet at all times.   Below is a summary of the most important preventative healthcare services that adult males should seek on a regular basis throughout their lives:   Preventative Care for Adult Males    Preventative Service  Ages 19-39  Ages 40-64  Ages 65 and over    Schedule of medical visits  Every 5 years  Every 5 years     Schedule of dental visits  Every 6-12 months  Every 6-12 months     Health risk assessment and lifestyle counseling  Every 3-5 years  Every 3-5 years  Every 3-5 years    Blood pressure check**  Every 2 years  Every 2 years  Every 2 years    Total cholesterol check including HDL**  Every 5 years beginning at age 35  Every 5 years  Every 5 years through age 75, then optional.    Flexible sigmoidoscopy or colonoscopy**   Every 5 years beginning at age 50  Every 5 years through age 80, then optional.    Prostate screening   Every year beginning at age 50  Every Year    Testicular exam  Monthly  Monthly  Monthly    FOBT (fecal occult blood test)   Every year beginning at age 50  Every year until 80, then optional.    Skin self-exam  Monthly  Monthly  Monthly    Tetanus-diphtheria (Td) immunization  Every 10 years  Every 10 years  Every 10 years    Influenza immunization**  Every year  Every year  Every year    Pneumococcal immunization**  Optional  Optional  Every 5 years    Hepatitis B immunization**  Series of 3 immunizations   (if not done previously, usually given at 0, 1 to 2, and 4 to 6 months)  Check with your caregiver if vaccination not previously given.  Check with your caregiver if vaccination not previously given.    **Family history and personal history of  risk and conditions may change your physician's recommendations.    Document Released: 05/04/2001 Document Re-Released: 06/02/2009   [employer]® Patient Information ©  2011 [employer].

## 2010-11-02 ENCOUNTER — Telehealth: Payer: Self-pay | Admitting: Family Medicine

## 2010-11-02 MED ORDER — ATORVASTATIN CALCIUM 40 MG PO TABS: 40.0000 mg | ORAL_TABLET | Freq: Every day | ORAL | Status: DC

## 2010-11-02 MED ORDER — MELOXICAM 15 MG PO TABS: 15.0000 mg | ORAL_TABLET | Freq: Every day | ORAL | Status: DC

## 2010-11-02 MED ORDER — ESOMEPRAZOLE MAGNESIUM 40 MG PO CPDR: 40.0000 mg | DELAYED_RELEASE_CAPSULE | Freq: Every day | ORAL | Status: DC

## 2010-11-02 NOTE — Telephone Encounter (Signed)
LOV 10/08/10 Last refill on mobic 07/22/10

## 2010-11-02 NOTE — Telephone Encounter (Signed)
Ok to refill mobic x1 Refill nexium for a year and lipitor for 6 months

## 2010-11-03 ENCOUNTER — Other Ambulatory Visit: Payer: Self-pay | Admitting: Family Medicine

## 2010-11-03 MED ORDER — ESOMEPRAZOLE MAGNESIUM 40 MG PO CPDR: 40.0000 mg | DELAYED_RELEASE_CAPSULE | Freq: Every day | ORAL | Status: DC

## 2010-11-03 MED ORDER — ATORVASTATIN CALCIUM 40 MG PO TABS: 40.0000 mg | ORAL_TABLET | Freq: Every day | ORAL | Status: DC

## 2010-11-03 NOTE — Telephone Encounter (Signed)
Rx resent to pharmacy

## 2010-11-04 NOTE — Telephone Encounter (Signed)
Candice from [facility] called to report that patient needs 90 day prescriptions for Lipitor and Nexium per his [employer] and these prescriptions were only for 30---patient requests quantity change to 90 and please resend prescription     thanks

## 2010-11-06 NOTE — Telephone Encounter (Signed)
Patient's wife called to check status of the requested 90 day prescriptions for Lipitor and Nexium--pt is out of meds--needs both prescriptions for 90 days to be called in to [facility]--please call wife=Angela to confirm that both prescriptions have been called in  Her cell is [phone]

## 2010-11-10 MED ORDER — ATORVASTATIN CALCIUM 40 MG PO TABS: 40.0000 mg | ORAL_TABLET | Freq: Every day | ORAL | Status: DC

## 2010-11-10 MED ORDER — ESOMEPRAZOLE MAGNESIUM 40 MG PO CPDR: 40.0000 mg | DELAYED_RELEASE_CAPSULE | Freq: Every day | ORAL | Status: DC

## 2010-11-10 NOTE — Telephone Encounter (Signed)
Addended by: Arnette Norris on: 11/10/2010 10:02 AM   Modules accepted: Orders

## 2010-11-10 NOTE — Telephone Encounter (Signed)
90 day supply faxed     KP

## 2010-11-10 NOTE — Telephone Encounter (Signed)
Patient and wife have been calling and checking on these 90 day prescriptions---[facility] has not received anything from us--please resend these two 90 day prescriptions  (have to be 90, not 30)

## 2011-02-02 ENCOUNTER — Other Ambulatory Visit: Payer: Self-pay | Admitting: Family Medicine

## 2011-02-02 NOTE — Telephone Encounter (Signed)
LAST OV 10-08-10, LAST FILLED 11-02-10,

## 2011-02-02 NOTE — Telephone Encounter (Signed)
Ok to refill x1  2 refills 

## 2011-02-03 MED ORDER — MELOXICAM 15 MG PO TABS: 15.0000 mg | ORAL_TABLET | Freq: Every day | ORAL | Status: DC

## 2011-02-03 NOTE — Telephone Encounter (Signed)
rx sent to pharmacy by e-script  

## 2011-02-04 ENCOUNTER — Telehealth: Payer: Self-pay | Admitting: *Deleted

## 2011-02-04 NOTE — Telephone Encounter (Signed)
Prior Auth Approved 12-05-10 until 02-04-12, pharmacy notified via fax

## 2011-04-14 ENCOUNTER — Other Ambulatory Visit: Payer: Self-pay | Admitting: Family Medicine

## 2011-04-14 DIAGNOSIS — E785 Hyperlipidemia, unspecified: Secondary | ICD-10-CM

## 2011-04-15 ENCOUNTER — Other Ambulatory Visit (INDEPENDENT_AMBULATORY_CARE_PROVIDER_SITE_OTHER): Payer: Federal, State, Local not specified - PPO

## 2011-04-15 DIAGNOSIS — E785 Hyperlipidemia, unspecified: Secondary | ICD-10-CM

## 2011-04-15 LAB — HEPATIC FUNCTION PANEL
ALT: 21 U/L (ref 0–53)
AST: 18 U/L (ref 0–37)
Albumin: 4.2 g/dL (ref 3.5–5.2)
Alkaline Phosphatase: 57 U/L (ref 39–117)
Bilirubin, Direct: 0.1 mg/dL (ref 0.0–0.3)
Total Bilirubin: 1 mg/dL (ref 0.3–1.2)
Total Protein: 7 g/dL (ref 6.0–8.3)

## 2011-04-15 LAB — LIPID PANEL
Cholesterol: 119 mg/dL (ref 0–200)
HDL: 40.1 mg/dL (ref >39.00)
LDL Cholesterol: 63 mg/dL (ref 0–99)
Total CHOL/HDL Ratio: 3
Triglycerides: 81 mg/dL (ref 0.0–149.0)
VLDL: 16.2 mg/dL (ref 0.0–40.0)

## 2011-04-15 LAB — BASIC METABOLIC PANEL
BUN: 14 mg/dL (ref 6–23)
CO2: 29 mEq/L (ref 19–32)
Calcium: 9.2 mg/dL (ref 8.4–10.5)
Chloride: 104 mEq/L (ref 96–112)
Creatinine, Ser: 1 mg/dL (ref 0.4–1.5)
GFR: 77.04 mL/min (ref >60.00)
Glucose, Bld: 95 mg/dL (ref 70–99)
Potassium: 4.6 mEq/L (ref 3.5–5.1)
Sodium: 139 mEq/L (ref 135–145)

## 2011-04-30 ENCOUNTER — Other Ambulatory Visit: Payer: Self-pay | Admitting: Family Medicine

## 2011-04-30 MED ORDER — ATORVASTATIN CALCIUM 40 MG PO TABS: 40.0000 mg | ORAL_TABLET | Freq: Every day | ORAL | Status: DC

## 2011-04-30 NOTE — Telephone Encounter (Signed)
Labs current--Rx faxed     KP     

## 2011-10-28 ENCOUNTER — Telehealth: Payer: Self-pay | Admitting: Family Medicine

## 2011-10-28 MED ORDER — MELOXICAM 15 MG PO TABS: 15.0000 mg | ORAL_TABLET | Freq: Every day | ORAL | Status: DC

## 2011-10-28 MED ORDER — ATORVASTATIN CALCIUM 40 MG PO TABS: 40.0000 mg | ORAL_TABLET | Freq: Every day | ORAL | Status: DC

## 2011-10-28 MED ORDER — ESOMEPRAZOLE MAGNESIUM 40 MG PO CPDR: 40.0000 mg | DELAYED_RELEASE_CAPSULE | Freq: Every day | ORAL | Status: DC

## 2011-10-28 NOTE — Telephone Encounter (Signed)
Please schedule a lab apt.      KP 

## 2011-10-28 NOTE — Telephone Encounter (Signed)
Coming Tuesday 8.13  at 8am

## 2011-10-28 NOTE — Telephone Encounter (Signed)
Pt is due for labs this month----hep lipid 272.4 --refill only until 12/2011 ov--- and only when lab appointment made

## 2011-10-28 NOTE — Telephone Encounter (Signed)
last seen 10/08/10 and filled 07/26/11. Please advise    KP

## 2011-10-28 NOTE — Telephone Encounter (Signed)
Refill: Atorvastatin calcium 40mg  tab #90. Take 1 tablet by mouth once daily. Qty 90. Last fill 07-26-11 Meloxicam 15mg  tab. Take 1 tablet by mouth once daily. Qty 90. Last fill 07-26-11 Nexium 40mg  cap. Take 1 capsule by mouth once daily. Qty 90. Last fill 07-26-11

## 2011-11-02 ENCOUNTER — Other Ambulatory Visit (INDEPENDENT_AMBULATORY_CARE_PROVIDER_SITE_OTHER): Payer: Medicare Other

## 2011-11-02 ENCOUNTER — Encounter: Payer: Self-pay | Admitting: *Deleted

## 2011-11-02 DIAGNOSIS — E785 Hyperlipidemia, unspecified: Secondary | ICD-10-CM | POA: Diagnosis not present

## 2011-11-02 LAB — HEPATIC FUNCTION PANEL
ALT: 21 U/L (ref 0–53)
AST: 17 U/L (ref 0–37)
Albumin: 4.3 g/dL (ref 3.5–5.2)
Alkaline Phosphatase: 50 U/L (ref 39–117)
Bilirubin, Direct: 0.1 mg/dL (ref 0.0–0.3)
Total Bilirubin: 0.9 mg/dL (ref 0.3–1.2)
Total Protein: 7.2 g/dL (ref 6.0–8.3)

## 2011-11-02 LAB — LIPID PANEL
Cholesterol: 140 mg/dL (ref 0–200)
HDL: 51 mg/dL (ref >39.00)
LDL Cholesterol: 68 mg/dL (ref 0–99)
Total CHOL/HDL Ratio: 3
Triglycerides: 106 mg/dL (ref 0.0–149.0)
VLDL: 21.2 mg/dL (ref 0.0–40.0)

## 2011-11-02 NOTE — Progress Notes (Signed)
Labs only

## 2011-12-24 ENCOUNTER — Ambulatory Visit (INDEPENDENT_AMBULATORY_CARE_PROVIDER_SITE_OTHER): Payer: Medicare Other | Admitting: Family Medicine

## 2011-12-24 ENCOUNTER — Encounter: Payer: Self-pay | Admitting: Family Medicine

## 2011-12-24 VITALS — BP 126/70 | HR 65 | Temp 98.2°F | Ht 69.0 in | Wt 280.8 lb

## 2011-12-24 DIAGNOSIS — Z139 Encounter for screening, unspecified: Secondary | ICD-10-CM | POA: Diagnosis not present

## 2011-12-24 DIAGNOSIS — Z0000 Encounter for general adult medical examination without abnormal findings: Secondary | ICD-10-CM

## 2011-12-24 DIAGNOSIS — Z23 Encounter for immunization: Secondary | ICD-10-CM

## 2011-12-24 DIAGNOSIS — E785 Hyperlipidemia, unspecified: Secondary | ICD-10-CM

## 2011-12-24 DIAGNOSIS — H9190 Unspecified hearing loss, unspecified ear: Secondary | ICD-10-CM | POA: Diagnosis not present

## 2011-12-24 DIAGNOSIS — H9319 Tinnitus, unspecified ear: Secondary | ICD-10-CM | POA: Diagnosis not present

## 2011-12-24 DIAGNOSIS — G4730 Sleep apnea, unspecified: Secondary | ICD-10-CM

## 2011-12-24 DIAGNOSIS — Z125 Encounter for screening for malignant neoplasm of prostate: Secondary | ICD-10-CM

## 2011-12-24 DIAGNOSIS — I10 Essential (primary) hypertension: Secondary | ICD-10-CM | POA: Diagnosis not present

## 2011-12-24 DIAGNOSIS — R3915 Urgency of urination: Secondary | ICD-10-CM

## 2011-12-24 DIAGNOSIS — R350 Frequency of micturition: Secondary | ICD-10-CM

## 2011-12-24 LAB — BASIC METABOLIC PANEL
BUN: 20 mg/dL (ref 6–23)
CO2: 29 mEq/L (ref 19–32)
Calcium: 9.2 mg/dL (ref 8.4–10.5)
Chloride: 101 mEq/L (ref 96–112)
Creatinine, Ser: 1 mg/dL (ref 0.4–1.5)
GFR: 76.88 mL/min (ref >60.00)
Glucose, Bld: 86 mg/dL (ref 70–99)
Potassium: 4 mEq/L (ref 3.5–5.1)
Sodium: 138 mEq/L (ref 135–145)

## 2011-12-24 LAB — PSA: PSA: 0.72 ng/mL (ref 0.10–4.00)

## 2011-12-24 LAB — POCT URINALYSIS DIPSTICK
Blood, UA: NEGATIVE
Glucose, UA: NEGATIVE
Leukocytes, UA: NEGATIVE
Nitrite, UA: NEGATIVE
Protein, UA: NEGATIVE
Spec Grav, UA: 1.02
Urobilinogen, UA: 0.2
pH, UA: 6

## 2011-12-24 LAB — CBC WITH DIFFERENTIAL/PLATELET
Basophils Absolute: 0 10*3/uL (ref 0.0–0.1)
Basophils Relative: 0.3 % (ref 0.0–3.0)
Eosinophils Absolute: 0.1 10*3/uL (ref 0.0–0.7)
Eosinophils Relative: 1 % (ref 0.0–5.0)
HCT: 47.6 % (ref 39.0–52.0)
Hemoglobin: 15.6 g/dL (ref 13.0–17.0)
Lymphocytes Relative: 25.9 % (ref 12.0–46.0)
Lymphs Abs: 2 10*3/uL (ref 0.7–4.0)
MCHC: 32.8 g/dL (ref 30.0–36.0)
MCV: 91.9 fl (ref 78.0–100.0)
Monocytes Absolute: 0.8 10*3/uL (ref 0.1–1.0)
Monocytes Relative: 10.8 % (ref 3.0–12.0)
Neutro Abs: 4.9 10*3/uL (ref 1.4–7.7)
Neutrophils Relative %: 62 % (ref 43.0–77.0)
Platelets: 210 10*3/uL (ref 150.0–400.0)
RBC: 5.19 Mil/uL (ref 4.22–5.81)
RDW: 13.4 % (ref 11.5–14.6)
WBC: 7.9 10*3/uL (ref 4.5–10.5)

## 2011-12-24 NOTE — Assessment & Plan Note (Signed)
Check labs 

## 2011-12-24 NOTE — Patient Instructions (Signed)
Preventive Care for Adults, Male A healthy lifestyle and preventive care can promote health and wellness. Preventive health guidelines for men include the following key practices:  A routine yearly physical is a good way to check with your caregiver about your health and preventative screening. It is a chance to share any concerns and updates on your health, and to receive a thorough exam.  Visit your dentist for a routine exam and preventative care every 6 months. Brush your teeth twice a day and floss once a day. Good oral hygiene prevents tooth decay and gum disease.  The frequency of eye exams is based on your age, health, family medical history, use of contact lenses, and other factors. Follow your caregiver's recommendations for frequency of eye exams.  Eat a healthy diet. Foods like vegetables, fruits, whole grains, low-fat dairy products, and lean protein foods contain the nutrients you need without too many calories. Decrease your intake of foods high in solid fats, added sugars, and salt. Eat the right amount of calories for you.Get information about a proper diet from your caregiver, if necessary.  Regular physical exercise is one of the most important things you can do for your health. Most adults should get at least 150 minutes of moderate-intensity exercise (any activity that increases your heart rate and causes you to sweat) each week. In addition, most adults need muscle-strengthening exercises on 2 or more days a week.  Maintain a healthy weight. The body mass index (BMI) is a screening tool to identify possible weight problems. It provides an estimate of body fat based on height and weight. Your caregiver can help determine your BMI, and can help you achieve or maintain a healthy weight.For adults 20 years and older:  A BMI below 18.5 is considered underweight.  A BMI of 18.5 to 24.9 is normal.  A BMI of 25 to 29.9 is considered overweight.  A BMI of 30 and above is  considered obese.  Maintain normal blood lipids and cholesterol levels by exercising and minimizing your intake of saturated fat. Eat a balanced diet with plenty of fruit and vegetables. Blood tests for lipids and cholesterol should begin at age 20 and be repeated every 5 years. If your lipid or cholesterol levels are high, you are over 50, or you are a high risk for heart disease, you may need your cholesterol levels checked more frequently.Ongoing high lipid and cholesterol levels should be treated with medicines if diet and exercise are not effective.  If you smoke, find out from your caregiver how to quit. If you do not use tobacco, do not start.  If you choose to drink alcohol, do not exceed 2 drinks per day. One drink is considered to be 12 ounces (355 mL) of beer, 5 ounces (148 mL) of wine, or 1.5 ounces (44 mL) of liquor.  Avoid use of street drugs. Do not share needles with anyone. Ask for help if you need support or instructions about stopping the use of drugs.  High blood pressure causes heart disease and increases the risk of stroke. Your blood pressure should be checked at least every 1 to 2 years. Ongoing high blood pressure should be treated with medicines, if weight loss and exercise are not effective.  If you are 45 to 65 years old, ask your caregiver if you should take aspirin to prevent heart disease.  Diabetes screening involves taking a blood sample to check your fasting blood sugar level. This should be done once every 3 years,   after age 45, if you are within normal weight and without risk factors for diabetes. Testing should be considered at a younger age or be carried out more frequently if you are overweight and have at least 1 risk factor for diabetes.  Colorectal cancer can be detected and often prevented. Most routine colorectal cancer screening begins at the age of 50 and continues through age 75. However, your caregiver may recommend screening at an earlier age if you  have risk factors for colon cancer. On a yearly basis, your caregiver may provide home test kits to check for hidden blood in the stool. Use of a small camera at the end of a tube, to directly examine the colon (sigmoidoscopy or colonoscopy), can detect the earliest forms of colorectal cancer. Talk to your caregiver about this at age 50, when routine screening begins. Direct examination of the colon should be repeated every 5 to 10 years through age 75, unless early forms of pre-cancerous polyps or small growths are found.  Hepatitis C blood testing is recommended for all people born from 1945 through 1965 and any individual with known risks for hepatitis C.  Practice safe sex. Use condoms and avoid high-risk sexual practices to reduce the spread of sexually transmitted infections (STIs). STIs include gonorrhea, chlamydia, syphilis, trichomonas, herpes, HPV, and human immunodeficiency virus (HIV). Herpes, HIV, and HPV are viral illnesses that have no cure. They can result in disability, cancer, and death.  A one-time screening for abdominal aortic aneurysm (AAA) and surgical repair of large AAAs by sound wave imaging (ultrasonography) is recommended for ages 65 to 75 years who are current or former smokers.  Healthy men should no longer receive prostate-specific antigen (PSA) blood tests as part of routine cancer screening. Consult with your caregiver about prostate cancer screening.  Testicular cancer screening is not recommended for adult males who have no symptoms. Screening includes self-exam, caregiver exam, and other screening tests. Consult with your caregiver about any symptoms you have or any concerns you have about testicular cancer.  Use sunscreen with skin protection factor (SPF) of 30 or more. Apply sunscreen liberally and repeatedly throughout the day. You should seek shade when your shadow is shorter than you. Protect yourself by wearing long sleeves, pants, a wide-brimmed hat, and  sunglasses year round, whenever you are outdoors.  Once a month, do a whole body skin exam, using a mirror to look at the skin on your back. Notify your caregiver of new moles, moles that have irregular borders, moles that are larger than a pencil eraser, or moles that have changed in shape or color.  Stay current with required immunizations.  Influenza. You need a dose every fall (or winter). The composition of the flu vaccine changes each year, so being vaccinated once is not enough.  Pneumococcal polysaccharide. You need 1 to 2 doses if you smoke cigarettes or if you have certain chronic medical conditions. You need 1 dose at age 65 (or older) if you have never been vaccinated.  Tetanus, diphtheria, pertussis (Tdap, Td). Get 1 dose of Tdap vaccine if you are younger than age 65 years, are over 65 and have contact with an infant, are a healthcare worker, or simply want to be protected from whooping cough. After that, you need a Td booster dose every 10 years. Consult your caregiver if you have not had at least 3 tetanus and diphtheria-containing shots sometime in your life or have a deep or dirty wound.  HPV. This vaccine is recommended   for males 13 through 65 years of age. This vaccine may be given to men 22 through 65 years of age who have not completed the 3 dose series. It is recommended for men through age 26 who have sex with men or whose immune system is weakened because of HIV infection, other illness, or medications. The vaccine is given in 3 doses over 6 months.  Measles, mumps, rubella (MMR). You need at least 1 dose of MMR if you were born in 1957 or later. You may also need a 2nd dose.  Meningococcal. If you are age 19 to 21 years and a first-year college student living in a residence hall, or have one of several medical conditions, you need to get vaccinated against meningococcal disease. You may also need additional booster doses.  Zoster (shingles). If you are age 60 years or  older, you should get this vaccine.  Varicella (chickenpox). If you have never had chickenpox or you were vaccinated but received only 1 dose, talk to your caregiver to find out if you need this vaccine.  Hepatitis A. You need this vaccine if you have a specific risk factor for hepatitis A virus infection, or you simply wish to be protected from this disease. The vaccine is usually given as 2 doses, 6 to 18 months apart.  Hepatitis B. You need this vaccine if you have a specific risk factor for hepatitis B virus infection or you simply wish to be protected from this disease. The vaccine is given in 3 doses, usually over 6 months. Preventative Service / Frequency Ages 19 to 39  Blood pressure check.** / Every 1 to 2 years.  Lipid and cholesterol check.** / Every 5 years beginning at age 20.  Hepatitis C blood test.** / For any individual with known risks for hepatitis C.  Skin self-exam. / Monthly.  Influenza immunization.** / Every year.  Pneumococcal polysaccharide immunization.** / 1 to 2 doses if you smoke cigarettes or if you have certain chronic medical conditions.  Tetanus, diphtheria, pertussis (Tdap,Td) immunization. / A one-time dose of Tdap vaccine. After that, you need a Td booster dose every 10 years.  HPV immunization. / 3 doses over 6 months, if 26 and younger.  Measles, mumps, rubella (MMR) immunization. / You need at least 1 dose of MMR if you were born in 1957 or later. You may also need a 2nd dose.  Meningococcal immunization. / 1 dose if you are age 19 to 21 years and a first-year college student living in a residence hall, or have one of several medical conditions, you need to get vaccinated against meningococcal disease. You may also need additional booster doses.  Varicella immunization.** / Consult your caregiver.  Hepatitis A immunization.** / Consult your caregiver. 2 doses, 6 to 18 months apart.  Hepatitis B immunization.** / Consult your caregiver. 3 doses  usually over 6 months. Ages 40 to 64  Blood pressure check.** / Every 1 to 2 years.  Lipid and cholesterol check.** / Every 5 years beginning at age 20.  Fecal occult blood test (FOBT) of stool. / Every year beginning at age 50 and continuing until age 75. You may not have to do this test if you get colonoscopy every 10 years.  Flexible sigmoidoscopy** or colonoscopy.** / Every 5 years for a flexible sigmoidoscopy or every 10 years for a colonoscopy beginning at age 50 and continuing until age 75.  Hepatitis C blood test.** / For all people born from 1945 through 1965 and any   individual with known risks for hepatitis C.  Skin self-exam. / Monthly.  Influenza immunization.** / Every year.  Pneumococcal polysaccharide immunization.** / 1 to 2 doses if you smoke cigarettes or if you have certain chronic medical conditions.  Tetanus, diphtheria, pertussis (Tdap/Td) immunization.** / A one-time dose of Tdap vaccine. After that, you need a Td booster dose every 10 years.  Measles, mumps, rubella (MMR) immunization. / You need at least 1 dose of MMR if you were born in 1957 or later. You may also need a 2nd dose.  Varicella immunization.**/ Consult your caregiver.  Meningococcal immunization.** / Consult your caregiver.  Hepatitis A immunization.** / Consult your caregiver. 2 doses, 6 to 18 months apart.  Hepatitis B immunization.** / Consult your caregiver. 3 doses, usually over 6 months. Ages 65 and over  Blood pressure check.** / Every 1 to 2 years.  Lipid and cholesterol check.**/ Every 5 years beginning at age 20.  Fecal occult blood test (FOBT) of stool. / Every year beginning at age 50 and continuing until age 75. You may not have to do this test if you get colonoscopy every 10 years.  Flexible sigmoidoscopy** or colonoscopy.** / Every 5 years for a flexible sigmoidoscopy or every 10 years for a colonoscopy beginning at age 50 and continuing until age 75.  Hepatitis C blood  test.** / For all people born from 1945 through 1965 and any individual with known risks for hepatitis C.  Abdominal aortic aneurysm (AAA) screening.** / A one-time screening for ages 65 to 75 years who are current or former smokers.  Skin self-exam. / Monthly.  Influenza immunization.** / Every year.  Pneumococcal polysaccharide immunization.** / 1 dose at age 65 (or older) if you have never been vaccinated.  Tetanus, diphtheria, pertussis (Tdap, Td) immunization. / A one-time dose of Tdap vaccine if you are over 65 and have contact with an infant, are a healthcare worker, or simply want to be protected from whooping cough. After that, you need a Td booster dose every 10 years.  Varicella immunization. ** / Consult your caregiver.  Meningococcal immunization.** / Consult your caregiver.  Hepatitis A immunization. ** / Consult your caregiver. 2 doses, 6 to 18 months apart.  Hepatitis B immunization.** / Check with your caregiver. 3 doses, usually over 6 months. **Family history and personal history of risk and conditions may change your caregiver's recommendations. Document Released: 05/04/2001 Document Revised: 05/31/2011 Document Reviewed: 08/03/2010 Exit[employer] Patient Information 2013 [employer].  

## 2011-12-24 NOTE — Assessment & Plan Note (Signed)
Will put referral in for [facility] to get supplies for cpap

## 2011-12-24 NOTE — Assessment & Plan Note (Signed)
Refer to hearing clinic 

## 2011-12-24 NOTE — Progress Notes (Signed)
Subjective:    Eric [name] is a 66 y.o. male who presents for a welcome to Medicare exam.   Cardiac risk factors: advanced age (older than 75 for men, 54 for women), dyslipidemia, male gender, obesity (BMI >= 30 kg/m2) and sedentary lifestyle.  Depression Screen (Note: if answer to either of the following is "Yes", a more complete depression screening is indicated)  Q1: Over the past two weeks, have you felt down, depressed or hopeless? no Q2: Over the past two weeks, have you felt little interest or pleasure in doing things? no  Activities of Daily Living In your present state of health, do you have any difficulty performing the following activities?:  Preparing food and eating?: No Bathing yourself: No Getting dressed: No Using the toilet:No Moving around from place to place: No In the past year have you fallen or had a near fall?:No  Current exercise habits: yard work 2-3 days a week  Dietary issues discussed: pt trying to lose weight  Hearing difficulties: Yes Safe in current home environment: yes  The following portions of the patient's history were reviewed and updated as appropriate:  He  has a past medical history of Internal hemorrhoids without mention of complication; Osteoarthrosis, unspecified whether generalized or localized, unspecified site; Family history of malignant neoplasm of breast; GERD (gastroesophageal reflux disease); Hyperlipemia; Colon polyps; and Diverticulosis of colon. He  does not have any pertinent problems on file. He  has past surgical history that includes Shoulder surgery (2009) and Tonsillectomy. His family history includes Arthritis in his mother; Breast cancer in an unspecified family member; Cancer (age of onset:50) in his sister; Cancer (age of onset:60) in his mother; Cancer (age of onset:70) in his brother; Colon polyps in his mother; Heart disease in his father; Hyperlipidemia in his father and mother; Hypertension in his father; and  Prostate cancer in an unspecified family member. He  reports that he has never smoked. He does not have any smokeless tobacco history on file. He reports that he drinks alcohol. He reports that he does not use illicit drugs. He has a current medication list which includes the following prescription(s): aspirin, atorvastatin, coq10, esomeprazole, fluocinonide ointment, meloxicam, multiple vitamins-minerals, mupirocin cream, and trimethobenzamide. Current Outpatient Prescriptions on File Prior to Visit  Medication Sig Dispense Refill  . aspirin 81 MG EC tablet Take 81 mg by mouth daily.        [name] atorvastatin (LIPITOR) 40 MG tablet Take 1 tablet (40 mg total) by mouth daily.  90 tablet  0  . Coenzyme Q10 (COQ10) 100 MG CAPS Take 1 capsule by mouth daily.        [name] esomeprazole (NEXIUM) 40 MG capsule Take 1 capsule (40 mg total) by mouth daily.  90 capsule  3  . meloxicam (MOBIC) 15 MG tablet Take 1 tablet (15 mg total) by mouth daily.  90 tablet  0  . Multiple Vitamin (MULTIVITAMIN PO)         He  has no known allergies.. Review of Systems  Review of Systems  Constitutional: Negative for activity change, appetite change and fatigue.  HENT: Negative for hearing loss, congestion, tinnitus and ear discharge.  dentist--q58m Eyes: Negative for visual disturbance (see optho q2y -- vision corrected to 20/20 with glasses).  Respiratory: Negative for cough, chest tightness and shortness of breath.   Cardiovascular: Negative for chest pain, palpitations and leg swelling.  Gastrointestinal: Negative for abdominal pain, diarrhea, constipation and abdominal distention.  Genitourinary: Negative for urgency, frequency, decreased  urine volume and difficulty urinating.  Musculoskeletal: Negative for back pain, arthralgias and gait problem.  Skin: Negative for color change, pallor and rash.  Neurological: Negative for dizziness, light-headedness, numbness and headaches.  Hematological: Negative for adenopathy.  Does not bruise/bleed easily.  Psychiatric/Behavioral: Negative for suicidal ideas, confusion, sleep disturbance, self-injury, dysphoric mood, decreased concentration and agitation.  Pt is able to read and write and can do all ADLs No risk for falling No abuse/ violence in home      Objective:     Vision by Snellen chart: optho  Blood pressure 126/70, pulse 65, temperature 98.2 F (36.8 C), temperature source Oral, height 5\' 9"  (1.753 m), weight 280 lb 12.8 oz (127.37 kg), SpO2 96.00%. Body mass index is 41.47 kg/(m^2). BP 126/70  Pulse 65  Temp 98.2 F (36.8 C) (Oral)  Ht 5\' 9"  (1.753 m)  Wt 280 lb 12.8 oz (127.37 kg)  BMI 41.47 kg/m2  SpO2 96% General appearance: alert, cooperative, appears stated age and no distress Head: Normocephalic, without obvious abnormality, atraumatic Eyes: conjunctivae/corneas clear. PERRL, EOM's intact. Fundi benign. Ears: normal TM's and external ear canals both ears Nose: Nares normal. Septum midline. Mucosa normal. No drainage or sinus tenderness. Throat: lips, mucosa, and tongue normal; teeth and gums normal Neck: no adenopathy, no carotid bruit, no JVD, supple, symmetrical, trachea midline and thyroid not enlarged, symmetric, no tenderness/mass/nodules Back: symmetric, no curvature. ROM normal. No CVA tenderness. Lungs: clear to auscultation bilaterally Chest wall: no tenderness Heart: regular rate and rhythm, S1, S2 normal, no murmur, click, rub or gallop Abdomen: soft, non-tender; bowel sounds normal; no masses,  no organomegaly Male genitalia: normal Rectal: soft brown guaiac negative stool noted and enlarged prostate Extremities: extremities normal, atraumatic, no cyanosis or edema Pulses: 2+ and symmetric Skin: Skin color, texture, turgor normal. No rashes or lesions Lymph nodes: Cervical, supraclavicular, and axillary nodes normal. Neurologic: Grossly normal psych-- no axiety / depression    Assessment:    cpe     Plan:       During the course of the visit the patient was educated and counseled about appropriate screening and preventive services including:   Pneumococcal vaccine   Influenza vaccine  Td vaccine  Screening electrocardiogram  Prostate cancer screening  Colorectal cancer screening  Diabetes screening  Advanced directives: has an advanced directive - a copy HAS NOT been provided.  Patient Instructions (the written plan) was given to the patient.

## 2011-12-28 ENCOUNTER — Encounter: Payer: Self-pay | Admitting: Family Medicine

## 2011-12-29 ENCOUNTER — Encounter: Payer: Self-pay | Admitting: Family Medicine

## 2012-01-05 ENCOUNTER — Telehealth: Payer: Self-pay

## 2012-01-05 NOTE — Telephone Encounter (Signed)
Called left message pt called in yesterday checking on referral for [facility] for CPAP machine. I called just to advise pt I see it was faxed 12/29/11 and I need to call them to F/U on referral and I will call him back when I know something.   MW

## 2012-01-06 NOTE — Telephone Encounter (Signed)
information faxed 12/29/11-- I will refax         KP

## 2012-01-26 ENCOUNTER — Telehealth: Payer: Self-pay | Admitting: Family Medicine

## 2012-01-26 MED ORDER — MELOXICAM 15 MG PO TABS: 15.0000 mg | ORAL_TABLET | Freq: Every day | ORAL | Status: DC

## 2012-01-26 MED ORDER — ATORVASTATIN CALCIUM 40 MG PO TABS: 40.0000 mg | ORAL_TABLET | Freq: Every day | ORAL | Status: DC

## 2012-01-26 NOTE — Telephone Encounter (Signed)
Refill: Meloxicam 15 mg tab #90. Take 1 tablet by mouth once daily. Last fill 8.8.13  Atorvastatin calcium 40 mg tab #90. Take 1 tablet by mouth once daily. Last fill 10-28-11

## 2012-01-26 NOTE — Telephone Encounter (Signed)
Refill for 6 months. 

## 2012-01-26 NOTE — Telephone Encounter (Signed)
Last seen 12/24/11 and filled 10/28/11 # 90. Please advise     KP

## 2012-02-10 DIAGNOSIS — H903 Sensorineural hearing loss, bilateral: Secondary | ICD-10-CM | POA: Diagnosis not present

## 2012-02-10 DIAGNOSIS — H9319 Tinnitus, unspecified ear: Secondary | ICD-10-CM | POA: Diagnosis not present

## 2012-03-01 ENCOUNTER — Encounter: Payer: Self-pay | Admitting: Family Medicine

## 2012-03-14 ENCOUNTER — Telehealth: Payer: Self-pay | Admitting: Family Medicine

## 2012-03-14 MED ORDER — ESOMEPRAZOLE MAGNESIUM 40 MG PO CPDR: 40.0000 mg | DELAYED_RELEASE_CAPSULE | Freq: Every day | ORAL | Status: DC

## 2012-03-14 MED ORDER — ATORVASTATIN CALCIUM 40 MG PO TABS: 40.0000 mg | ORAL_TABLET | Freq: Every day | ORAL | Status: DC

## 2012-03-14 NOTE — Telephone Encounter (Signed)
nexium 40mg  caps  Atorvastatin 40mg  tabs

## 2012-03-14 NOTE — Telephone Encounter (Signed)
Rx sent 

## 2012-03-14 NOTE — Telephone Encounter (Signed)
New prescription request  meloxicam 15mg  tabs

## 2012-03-21 ENCOUNTER — Telehealth: Payer: Self-pay | Admitting: Family Medicine

## 2012-03-21 MED ORDER — MELOXICAM 15 MG PO TABS: 15.0000 mg | ORAL_TABLET | Freq: Every day | ORAL | Status: DC

## 2012-03-21 NOTE — Telephone Encounter (Signed)
Refill: Meloxicam 15 mg tabs. 90 day supply

## 2012-03-21 NOTE — Telephone Encounter (Signed)
Refill x1  3 refills 

## 2012-03-21 NOTE — Telephone Encounter (Signed)
Last seen 12/24/11 and 01/26/12 #90 with 1 refill. Please advise     KP

## 2012-03-21 NOTE — Telephone Encounter (Signed)
Faxed.   KP 

## 2012-04-10 ENCOUNTER — Other Ambulatory Visit: Payer: Self-pay | Admitting: *Deleted

## 2012-04-10 MED ORDER — MELOXICAM 15 MG PO TABS: 15.0000 mg | ORAL_TABLET | Freq: Every day | ORAL | Status: DC

## 2012-04-10 MED ORDER — ATORVASTATIN CALCIUM 40 MG PO TABS: 40.0000 mg | ORAL_TABLET | Freq: Every day | ORAL | Status: DC

## 2012-04-10 MED ORDER — ESOMEPRAZOLE MAGNESIUM 40 MG PO CPDR: 40.0000 mg | DELAYED_RELEASE_CAPSULE | Freq: Every day | ORAL | Status: DC

## 2012-06-01 ENCOUNTER — Other Ambulatory Visit: Payer: Self-pay | Admitting: Family Medicine

## 2012-06-01 DIAGNOSIS — G4730 Sleep apnea, unspecified: Secondary | ICD-10-CM

## 2012-06-13 ENCOUNTER — Other Ambulatory Visit: Payer: Self-pay | Admitting: Family Medicine

## 2012-06-13 DIAGNOSIS — E7800 Pure hypercholesterolemia, unspecified: Secondary | ICD-10-CM

## 2012-06-13 NOTE — Telephone Encounter (Signed)
Refill for lipitor sent to [facility] 

## 2012-06-26 ENCOUNTER — Other Ambulatory Visit: Payer: Self-pay | Admitting: *Deleted

## 2012-06-26 MED ORDER — MELOXICAM 15 MG PO TABS: 15.0000 mg | ORAL_TABLET | Freq: Every day | ORAL | Status: AC

## 2012-06-26 NOTE — Progress Notes (Signed)
[facility] that [facility] not have med. mobic is currently out of stock, Pt request Rx sent to local pharmacy. .Rx sent Pt aware

## 2012-06-29 ENCOUNTER — Other Ambulatory Visit (INDEPENDENT_AMBULATORY_CARE_PROVIDER_SITE_OTHER): Payer: Medicare Other

## 2012-06-29 DIAGNOSIS — E785 Hyperlipidemia, unspecified: Secondary | ICD-10-CM | POA: Diagnosis not present

## 2012-06-29 LAB — HEPATIC FUNCTION PANEL
ALT: 36 U/L (ref 0–53)
AST: 22 U/L (ref 0–37)
Albumin: 4 g/dL (ref 3.5–5.2)
Alkaline Phosphatase: 57 U/L (ref 39–117)
Bilirubin, Direct: 0.1 mg/dL (ref 0.0–0.3)
Total Bilirubin: 0.9 mg/dL (ref 0.3–1.2)
Total Protein: 7 g/dL (ref 6.0–8.3)

## 2012-06-29 LAB — LIPID PANEL
Cholesterol: 144 mg/dL (ref 0–200)
HDL: 40.2 mg/dL (ref >39.00)
LDL Cholesterol: 81 mg/dL (ref 0–99)
Total CHOL/HDL Ratio: 4
Triglycerides: 113 mg/dL (ref 0.0–149.0)
VLDL: 22.6 mg/dL (ref 0.0–40.0)

## 2012-07-10 DIAGNOSIS — H524 Presbyopia: Secondary | ICD-10-CM | POA: Diagnosis not present

## 2012-07-10 DIAGNOSIS — H5200 Hypermetropia, unspecified eye: Secondary | ICD-10-CM | POA: Diagnosis not present

## 2012-07-10 DIAGNOSIS — H2510 Age-related nuclear cataract, unspecified eye: Secondary | ICD-10-CM | POA: Diagnosis not present

## 2012-08-20 ENCOUNTER — Other Ambulatory Visit: Payer: Self-pay | Admitting: Family Medicine

## 2012-12-22 ENCOUNTER — Telehealth: Payer: Self-pay

## 2012-12-22 NOTE — Addendum Note (Signed)
Addended by: Wende Mott on: 12/22/2012 09:56 AM   Modules accepted: Orders

## 2012-12-22 NOTE — Telephone Encounter (Signed)
HM UTD WE flu vaccine and 2nd PNA if applicable  Heart palpations x 3-4 months/interested in halter monitor (referrral). Neg for chest pain or SOB. Chest pain protocol given Last ekg 12/2011 Last  PSA 12/2011 Left hip pain x 3 months. Would like referral for x-ray Meds reconciled, pharmacy and allergies verified

## 2012-12-25 ENCOUNTER — Encounter: Payer: Self-pay | Admitting: Family Medicine

## 2012-12-25 ENCOUNTER — Ambulatory Visit (INDEPENDENT_AMBULATORY_CARE_PROVIDER_SITE_OTHER): Payer: Medicare Other | Admitting: Family Medicine

## 2012-12-25 VITALS — BP 140/82 | HR 58 | Temp 99.0°F | Ht 69.0 in | Wt 291.2 lb

## 2012-12-25 DIAGNOSIS — Z860100 Personal history of colon polyps, unspecified: Secondary | ICD-10-CM

## 2012-12-25 DIAGNOSIS — R002 Palpitations: Secondary | ICD-10-CM | POA: Diagnosis not present

## 2012-12-25 DIAGNOSIS — M25559 Pain in unspecified hip: Secondary | ICD-10-CM

## 2012-12-25 DIAGNOSIS — E669 Obesity, unspecified: Secondary | ICD-10-CM | POA: Insufficient documentation

## 2012-12-25 DIAGNOSIS — E66813 Obesity, class 3: Secondary | ICD-10-CM | POA: Insufficient documentation

## 2012-12-25 DIAGNOSIS — R350 Frequency of micturition: Secondary | ICD-10-CM | POA: Diagnosis not present

## 2012-12-25 DIAGNOSIS — Z0000 Encounter for general adult medical examination without abnormal findings: Secondary | ICD-10-CM

## 2012-12-25 DIAGNOSIS — M25552 Pain in left hip: Secondary | ICD-10-CM | POA: Insufficient documentation

## 2012-12-25 DIAGNOSIS — J209 Acute bronchitis, unspecified: Secondary | ICD-10-CM | POA: Diagnosis not present

## 2012-12-25 DIAGNOSIS — E785 Hyperlipidemia, unspecified: Secondary | ICD-10-CM

## 2012-12-25 LAB — LIPID PANEL
Cholesterol: 130 mg/dL (ref 0–200)
HDL: 43.9 mg/dL (ref >39.00)
LDL Cholesterol: 68 mg/dL (ref 0–99)
Total CHOL/HDL Ratio: 3
Triglycerides: 92 mg/dL (ref 0.0–149.0)
VLDL: 18.4 mg/dL (ref 0.0–40.0)

## 2012-12-25 LAB — CBC WITH DIFFERENTIAL/PLATELET
Basophils Absolute: 0 10*3/uL (ref 0.0–0.1)
Basophils Relative: 0.5 % (ref 0.0–3.0)
Eosinophils Absolute: 0.2 10*3/uL (ref 0.0–0.7)
Eosinophils Relative: 2.6 % (ref 0.0–5.0)
HCT: 44.3 % (ref 39.0–52.0)
Hemoglobin: 15 g/dL (ref 13.0–17.0)
Lymphocytes Relative: 22.4 % (ref 12.0–46.0)
Lymphs Abs: 1.3 10*3/uL (ref 0.7–4.0)
MCHC: 33.7 g/dL (ref 30.0–36.0)
MCV: 89.5 fl (ref 78.0–100.0)
Monocytes Absolute: 0.8 10*3/uL (ref 0.1–1.0)
Monocytes Relative: 12.7 % — ABNORMAL HIGH (ref 3.0–12.0)
Neutro Abs: 3.7 10*3/uL (ref 1.4–7.7)
Neutrophils Relative %: 61.8 % (ref 43.0–77.0)
Platelets: 188 10*3/uL (ref 150.0–400.0)
RBC: 4.96 Mil/uL (ref 4.22–5.81)
RDW: 13.7 % (ref 11.5–14.6)
WBC: 6 10*3/uL (ref 4.5–10.5)

## 2012-12-25 LAB — PSA: PSA: 0.82 ng/mL (ref 0.10–4.00)

## 2012-12-25 LAB — POCT URINALYSIS DIPSTICK
Bilirubin, UA: NEGATIVE
Blood, UA: NEGATIVE
Glucose, UA: NEGATIVE
Ketones, UA: NEGATIVE
Leukocytes, UA: NEGATIVE
Nitrite, UA: NEGATIVE
Protein, UA: NEGATIVE
Spec Grav, UA: 1.01
Urobilinogen, UA: 0.2
pH, UA: 6.5

## 2012-12-25 LAB — HEPATIC FUNCTION PANEL
ALT: 21 U/L (ref 0–53)
AST: 19 U/L (ref 0–37)
Albumin: 3.9 g/dL (ref 3.5–5.2)
Alkaline Phosphatase: 46 U/L (ref 39–117)
Bilirubin, Direct: 0.1 mg/dL (ref 0.0–0.3)
Total Bilirubin: 0.6 mg/dL (ref 0.3–1.2)
Total Protein: 7.1 g/dL (ref 6.0–8.3)

## 2012-12-25 LAB — MICROALBUMIN / CREATININE URINE RATIO
Creatinine,U: 129.8 mg/dL
Microalb Creat Ratio: 1.2 mg/g (ref 0.0–30.0)
Microalb, Ur: 1.5 mg/dL (ref 0.0–1.9)

## 2012-12-25 LAB — BASIC METABOLIC PANEL
BUN: 13 mg/dL (ref 6–23)
CO2: 26 mEq/L (ref 19–32)
Calcium: 8.9 mg/dL (ref 8.4–10.5)
Chloride: 104 mEq/L (ref 96–112)
Creatinine, Ser: 1 mg/dL (ref 0.4–1.5)
GFR: 75.79 mL/min (ref >60.00)
Glucose, Bld: 92 mg/dL (ref 70–99)
Potassium: 4.1 mEq/L (ref 3.5–5.1)
Sodium: 137 mEq/L (ref 135–145)

## 2012-12-25 MED ORDER — AZITHROMYCIN 250 MG PO TABS
ORAL_TABLET | ORAL | Status: DC
Start: 2012-12-25 — End: 2013-02-01

## 2012-12-25 NOTE — Assessment & Plan Note (Signed)
Check xray 

## 2012-12-25 NOTE — Assessment & Plan Note (Signed)
z pack per orders otc cough meds prn rto prn

## 2012-12-25 NOTE — Progress Notes (Signed)
Subjective:    Patient ID: Eric [name], male    DOB: 07-02-1945, 67 y.o.   MRN: [id number]  HPI    Review of Systems     Objective:   Physical Exam        Assessment & Plan:   Subjective:    Eric [name] is a 67 y.o. male who presents for Medicare Initial preventive examination.   Preventive Screening-Counseling & Management  Tobacco History  Smoking status  . Never Smoker   Smokeless tobacco  . Not on file    Problems Prior to Visit 1. L hip pain-- x several months-- improved slightly 2 palpitations worsening-- had event monitor in 80s   Current Problems (verified) Patient Active Problem List   Diagnosis Date Noted  . Urgency of urination 12/24/2011  . Urinary frequency 12/24/2011  . Preventative health care 10/08/2010  . Hyperlipidemia 10/08/2010  . DECREASED HEARING 09/04/2009  . INTERNAL HEMORRHOIDS WITHOUT MENTION COMP 09/04/2009  . ALLERGIC RHINITIS, SEASONAL 09/04/2009  . GERD 09/04/2009  . DIVERTICULOSIS, COLON 09/04/2009  . OSTEOARTHRITIS 09/04/2009  . SLEEP APNEA 09/04/2009    Medications Prior to Visit Current Outpatient Prescriptions on File Prior to Visit  Medication Sig Dispense Refill  . aspirin 81 MG EC tablet Take 81 mg by mouth daily.        [name] atorvastatin (LIPITOR) 40 MG tablet TAKE 1 TABLET DAILY  90 tablet  2  . Coenzyme Q-10 200 MG CAPS Take 1 each by mouth daily.      . fluocinonide ointment (LIDEX) 0.05 % Apply 1 application topically 2 (two) times daily.      . meloxicam (MOBIC) 15 MG tablet Take 1 tablet (15 mg total) by mouth daily.  90 tablet  1  . Multiple Vitamin (MULTIVITAMIN PO)        . mupirocin cream (BACTROBAN) 2 % Apply topically 3 (three) times daily.      [name] NEXIUM 40 MG capsule TAKE 1 CAPSULE DAILY  90 capsule  1  . trimethobenzamide (TIGAN) 300 MG capsule Take 300 mg by mouth 3 (three) times daily.       No current facility-administered medications on file prior to visit.    Current Medications  (verified) Current Outpatient Prescriptions  Medication Sig Dispense Refill  . aspirin 81 MG EC tablet Take 81 mg by mouth daily.        [name] atorvastatin (LIPITOR) 40 MG tablet TAKE 1 TABLET DAILY  90 tablet  2  . Coenzyme Q-10 200 MG CAPS Take 1 each by mouth daily.      . fluocinonide ointment (LIDEX) 0.05 % Apply 1 application topically 2 (two) times daily.      . meloxicam (MOBIC) 15 MG tablet Take 1 tablet (15 mg total) by mouth daily.  90 tablet  1  . Multiple Vitamin (MULTIVITAMIN PO)        . mupirocin cream (BACTROBAN) 2 % Apply topically 3 (three) times daily.      [name] NEXIUM 40 MG capsule TAKE 1 CAPSULE DAILY  90 capsule  1  . trimethobenzamide (TIGAN) 300 MG capsule Take 300 mg by mouth 3 (three) times daily.       No current facility-administered medications for this visit.     Allergies (verified) Review of patient's allergies indicates no known allergies.   PAST HISTORY  Family History Family History  Problem Relation Age of Onset  . Breast cancer    . Prostate cancer    . Arthritis Mother   .  Cancer Mother 87    breast  . Hyperlipidemia Mother   . Colon polyps Mother   . Heart disease Father     CAD--passed away 2011/08/17  . Hyperlipidemia Father   . Hypertension Father   . Cancer Sister 65    breast  . Cancer Brother 55    prostate    Social History History  Substance Use Topics  . Smoking status: Never Smoker   . Smokeless tobacco: Not on file  . Alcohol Use: 0.0 oz/week     Comment: rare----12 a year    Are there smokers in your home (other than you)?  No  Risk Factors Current exercise habits: The patient does not participate in regular exercise at present.  Dietary issues discussed: na   Cardiac risk factors: advanced age (older than 28 for men, 18 for women), dyslipidemia, male gender, obesity (BMI >= 30 kg/m2) and sedentary lifestyle.  Depression Screen (Note: if answer to either of the following is "Yes", a more complete depression screening is  indicated)   Q1: Over the past two weeks, have you felt down, depressed or hopeless? No  Q2: Over the past two weeks, have you felt little interest or pleasure in doing things? No  Have you lost interest or pleasure in daily life? No  Do you often feel hopeless? No  Do you cry easily over simple problems? No  Activities of Daily Living In your present state of health, do you have any difficulty performing the following activities?:  Driving? No Managing money?  No Feeding yourself? No Getting from bed to chair? No Climbing a flight of stairs? No Preparing food and eating?: No Bathing or showering? No Getting dressed: No Getting to the toilet? No Using the toilet:No Moving around from place to place: No In the past year have you fallen or had a near fall?:No   Are you sexually active?  Yes  Do you have more than one partner?  No  Hearing Difficulties: No Do you often ask people to speak up or repeat themselves? No Do you experience ringing or noises in your ears? No Do you have difficulty understanding soft or whispered voices? No   Do you feel that you have a problem with memory? No  Do you often misplace items? No  Do you feel safe at home?  No  Cognitive Testing  Alert? Yes  Normal Appearance?Yes  Oriented to person? Yes  Place? Yes   Time? Yes  Recall of three objects?  Yes  Can perform simple calculations? Yes  Displays appropriate judgment?Yes  Can read the correct time from a watch face?Yes   Advanced Directives have been discussed with the patient? Yes   List the Names of Other Physician/Practitioners you currently use: 1.  opth--[facility] 2.  Dentist--[facility]  Indicate any recent Medical Services you may have received from other than [facility] providers in the past year (date may be approximate).  Immunization History  Administered Date(s) Administered  . Influenza Split 12/24/2011  . Influenza Whole 01/01/2009  . Pneumococcal Polysaccharide 12/24/2011  . Td 05/01/2008   . Zoster 10/08/2010    Screening Tests Health Maintenance  Topic Date Due  . Influenza Vaccine  10/20/2012  . Colonoscopy  02/06/2018  . Tetanus/tdap  05/01/2018  . Pneumococcal Polysaccharide Vaccine Age 20 And Over  Completed  . Zostavax  Completed    All answers were reviewed with the patient and necessary referrals were made:  Loreen Freud, DO   12/25/2012  History reviewed:  He  has a past medical history of Internal hemorrhoids without mention of complication; Osteoarthrosis, unspecified whether generalized or localized, unspecified site; Family history of malignant neoplasm of breast; GERD (gastroesophageal reflux disease); Hyperlipemia; Colon polyps; and Diverticulosis of colon. He  does not have any pertinent problems on file. He  has past surgical history that includes Shoulder surgery (2009) and Tonsillectomy. His family history includes Arthritis in his mother; Breast cancer in an other family member; Cancer (age of onset: 85) in his sister; Cancer (age of onset: 87) in his mother; Cancer (age of onset: 61) in his brother; Colon polyps in his mother; Heart disease in his father; Hyperlipidemia in his father and mother; Hypertension in his father; Prostate cancer in an other family member. He  reports that he has never smoked. He does not have any smokeless tobacco history on file. He reports that  drinks alcohol. He reports that he does not use illicit drugs. He has a current medication list which includes the following prescription(s): aspirin, atorvastatin, coenzyme q-10, fluocinonide ointment, meloxicam, multiple vitamins-minerals, mupirocin cream, nexium, and trimethobenzamide. Current Outpatient Prescriptions on File Prior to Visit  Medication Sig Dispense Refill  . aspirin 81 MG EC tablet Take 81 mg by mouth daily.        [name] atorvastatin (LIPITOR) 40 MG tablet TAKE 1 TABLET DAILY  90 tablet  2  . Coenzyme Q-10 200 MG CAPS Take 1 each by mouth daily.      . fluocinonide  ointment (LIDEX) 0.05 % Apply 1 application topically 2 (two) times daily.      . meloxicam (MOBIC) 15 MG tablet Take 1 tablet (15 mg total) by mouth daily.  90 tablet  1  . Multiple Vitamin (MULTIVITAMIN PO)        . mupirocin cream (BACTROBAN) 2 % Apply topically 3 (three) times daily.      [name] NEXIUM 40 MG capsule TAKE 1 CAPSULE DAILY  90 capsule  1  . trimethobenzamide (TIGAN) 300 MG capsule Take 300 mg by mouth 3 (three) times daily.       No current facility-administered medications on file prior to visit.   He has No Known Allergies.   Review of Systems  Review of Systems  Constitutional: Negative for activity change, appetite change and fatigue.  HENT: Negative for congestion, tinnitus and ear discharge.  + hearing loss---b/l hearing aids Eyes: Negative for visual disturbance (see optho q1y -- vision corrected to 20/20 with glasses).  Respiratory: Negative for cough, chest tightness and shortness of breath.   Cardiovascular: Negative for chest pain, palpitations and leg swelling.  Gastrointestinal: Negative for abdominal pain, diarrhea, constipation and abdominal distention.  Genitourinary: Negative for urgency, frequency, decreased urine volume and difficulty urinating.  Musculoskeletal: Negative for back pain, arthralgias and gait problem.  Skin: Negative for color change, pallor and rash.  Neurological: Negative for dizziness, light-headedness, numbness and headaches.  Hematological: Negative for adenopathy. Does not bruise/bleed easily.  Psychiatric/Behavioral: Negative for suicidal ideas, confusion, sleep disturbance, self-injury, dysphoric mood, decreased concentration and agitation.  Pt is able to read and write and can do all ADLs No risk for falling No abuse/ violence in home     Objective:     Vision by Snellen chart: opth Blood pressure 140/82, pulse 58, temperature 99 F (37.2 C), temperature source Oral, height 5\' 9"  (1.753 m), weight 291 lb 3.2 oz (132.087  kg), SpO2 97.00%. Body mass index is 42.98 kg/(m^2).  BP 140/82  Pulse  58  Temp(Src) 99 F (37.2 C) (Oral)  Ht 5\' 9"  (1.753 m)  Wt 291 lb 3.2 oz (132.087 kg)  BMI 42.98 kg/m2  SpO2 97% General appearance: alert, cooperative, appears stated age, no distress and morbidly obese Head: Normocephalic, without obvious abnormality, atraumatic Eyes: negative findings: lids and lashes normal, conjunctivae and sclerae normal and pupils equal, round, reactive to light and accomodation Ears: normal TM's and external ear canals both ears Nose: Nares normal. Septum midline. Mucosa normal. No drainage or sinus tenderness. Throat: lips, mucosa, and tongue normal; teeth and gums normal Neck: no adenopathy, no carotid bruit, no JVD, supple, symmetrical, trachea midline and thyroid not enlarged, symmetric, no tenderness/mass/nodules Back: symmetric, no curvature. ROM normal. No CVA tenderness. Lungs: rhonchi posterior - right Chest wall: no tenderness Heart: S1, S2 normal + ?murmur 1-2/6 Abdomen: soft, non-tender; bowel sounds normal; no masses,  no organomegaly Male genitalia: normal Rectal: normal tone, normal prostate, no masses or tenderness and soft brown guaiac negative stool noted Extremities: extremities normal, atraumatic, no cyanosis or edema Pulses: 2+ and symmetric Skin: Skin color, texture, turgor normal. No rashes or lesions Lymph nodes: Cervical, supraclavicular, and axillary nodes normal. Neurologic: Alert and oriented X 3, normal strength and tone. Normal symmetric reflexes. Normal coordination and gait Psych-- no depression, no anxiety    EKG-- nsr   Assessment:     cpe     Plan:     During the course of the visit the patient was educated and counseled about appropriate screening and preventive services including:    Pneumococcal vaccine   Influenza vaccine  Screening electrocardiogram  Prostate cancer screening  Colorectal cancer screening  Diabetes  screening  Glaucoma screening  Nutrition counseling   Advanced directives: has an advanced directive - a copy HAS NOT been provided.  Diet review for nutrition referral? Yes ____  Not Indicated _x___   Patient Instructions (the written plan) was given to the patient.  Medicare Attestation I have personally reviewed: The patient's medical and social history Their use of alcohol, tobacco or illicit drugs Their current medications and supplements The patient's functional ability including ADLs,fall risks, home safety risks, cognitive, and hearing and visual impairment Diet and physical activities Evidence for depression or mood disorders  The patient's weight, height, BMI, and visual acuity have been recorded in the chart.  I have made referrals, counseling, and provided education to the patient based on review of the above and I have provided the patient with a written personalized care plan for preventive services.     Loreen Freud, DO   12/25/2012

## 2012-12-25 NOTE — Assessment & Plan Note (Signed)
ekg normal Cardio referral Event monitor and 2d echo

## 2012-12-25 NOTE — Progress Notes (Signed)
  Subjective:    Patient ID: Eric [name], male    DOB: November 12, 1945, 67 y.o.   MRN: [id number]  HPI    Review of Systems [facility]ist-[facility]-- Tepedino     Objective:   Physical Exam        Assessment & Plan:

## 2012-12-25 NOTE — Assessment & Plan Note (Signed)
Check labs con't meds 

## 2012-12-25 NOTE — Patient Instructions (Addendum)
Preventive Care for Adults, Male  A healthy lifestyle and preventive care can promote health and wellness. Preventive health guidelines for men include the following key practices:  · A routine yearly physical is a good way to check with your caregiver about your health and preventative screening. It is a chance to share any concerns and updates on your health, and to receive a thorough exam.  · Visit your dentist for a routine exam and preventative care every 6 months. Brush your teeth twice a day and floss once a day. Good oral hygiene prevents tooth decay and gum disease.  · The frequency of eye exams is based on your age, health, family medical history, use of contact lenses, and other factors. Follow your caregiver's recommendations for frequency of eye exams.  · Eat a healthy diet. Foods like vegetables, fruits, whole grains, low-fat dairy products, and lean protein foods contain the nutrients you need without too many calories. Decrease your intake of foods high in solid fats, added sugars, and salt. Eat the right amount of calories for you. Get information about a proper diet from your caregiver, if necessary.  · Regular physical exercise is one of the most important things you can do for your health. Most adults should get at least 150 minutes of moderate-intensity exercise (any activity that increases your heart rate and causes you to sweat) each week. In addition, most adults need muscle-strengthening exercises on 2 or more days a week.  · Maintain a healthy weight. The body mass index (BMI) is a screening tool to identify possible weight problems. It provides an estimate of body fat based on height and weight. Your caregiver can help determine your BMI, and can help you achieve or maintain a healthy weight. For adults 20 years and older:  · A BMI below 18.5 is considered underweight.  · A BMI of 18.5 to 24.9 is normal.  · A BMI of 25 to 29.9 is considered overweight.  · A BMI of 30 and above is  considered obese.  · Maintain normal blood lipids and cholesterol levels by exercising and minimizing your intake of saturated fat. Eat a balanced diet with plenty of fruit and vegetables. Blood tests for lipids and cholesterol should begin at age 20 and be repeated every 5 years. If your lipid or cholesterol levels are high, you are over 50, or you are a high risk for heart disease, you may need your cholesterol levels checked more frequently. Ongoing high lipid and cholesterol levels should be treated with medicines if diet and exercise are not effective.  · If you smoke, find out from your caregiver how to quit. If you do not use tobacco, do not start.  · If you choose to drink alcohol, do not exceed 2 drinks per day. One drink is considered to be 12 ounces (355 mL) of beer, 5 ounces (148 mL) of wine, or 1.5 ounces (44 mL) of liquor.  · Avoid use of street drugs. Do not share needles with anyone. Ask for help if you need support or instructions about stopping the use of drugs.  · High blood pressure causes heart disease and increases the risk of stroke. Your blood pressure should be checked at least every 1 to 2 years. Ongoing high blood pressure should be treated with medicines, if weight loss and exercise are not effective.  · If you are 45 to 66 years old, ask your caregiver if you should take aspirin to prevent heart disease.  · Diabetes screening involves taking   a blood sample to check your fasting blood sugar level. This should be done once every 3 years, after age 45, if you are within normal weight and without risk factors for diabetes. Testing should be considered at a younger age or be carried out more frequently if you are overweight and have at least 1 risk factor for diabetes.  · Colorectal cancer can be detected and often prevented. Most routine colorectal cancer screening begins at the age of 50 and continues through age 75. However, your caregiver may recommend screening at an earlier age if you  have risk factors for colon cancer. On a yearly basis, your caregiver may provide home test kits to check for hidden blood in the stool. Use of a small camera at the end of a tube, to directly examine the colon (sigmoidoscopy or colonoscopy), can detect the earliest forms of colorectal cancer. Talk to your caregiver about this at age 50, when routine screening begins.  Direct examination of the colon should be repeated every 5 to 10 years through age 75, unless early forms of pre-cancerous polyps or small growths are found.  · Hepatitis C blood testing is recommended for all people born from 1945 through 1965 and any individual with known risks for hepatitis C.  · Practice safe sex. Use condoms and avoid high-risk sexual practices to reduce the spread of sexually transmitted infections (STIs). STIs include gonorrhea, chlamydia, syphilis, trichomonas, herpes, HPV, and human immunodeficiency virus (HIV). Herpes, HIV, and HPV are viral illnesses that have no cure. They can result in disability, cancer, and death.  · A one-time screening for abdominal aortic aneurysm (AAA) and surgical repair of large AAAs by sound wave imaging (ultrasonography) is recommended for ages 65 to 75 years who are current or former smokers.  · Healthy men should no longer receive prostate-specific antigen (PSA) blood tests as part of routine cancer screening. Consult with your caregiver about prostate cancer screening.  · Testicular cancer screening is not recommended for adult males who have no symptoms. Screening includes self-exam, caregiver exam, and other screening tests. Consult with your caregiver about any symptoms you have or any concerns you have about testicular cancer.  · Use sunscreen with skin protection factor (SPF) of 30 or more. Apply sunscreen liberally and repeatedly throughout the day. You should seek shade when your shadow is shorter than you. Protect yourself by wearing long sleeves, pants, a wide-brimmed hat, and  sunglasses year round, whenever you are outdoors.  · Once a month, do a whole body skin exam, using a mirror to look at the skin on your back. Notify your caregiver of new moles, moles that have irregular borders, moles that are larger than a pencil eraser, or moles that have changed in shape or color.  · Stay current with required immunizations.  · Influenza. You need a dose every fall (or winter). The composition of the flu vaccine changes each year, so being vaccinated once is not enough.  · Pneumococcal polysaccharide. You need 1 to 2 doses if you smoke cigarettes or if you have certain chronic medical conditions. You need 1 dose at age 65 (or older) if you have never been vaccinated.  · Tetanus, diphtheria, pertussis (Tdap, Td). Get 1 dose of Tdap vaccine if you are younger than age 65 years, are over 65 and have contact with an infant, are a healthcare worker, or simply want to be protected from whooping cough. After that, you need a Td booster dose every 10 years. Consult your   caregiver if you have not had at least 3 tetanus and diphtheria-containing shots sometime in your life or have a deep or dirty wound.  · HPV. This vaccine is recommended for males 13 through 66 years of age. This vaccine may be given to men 22 through 66 years of age who have not completed the 3 dose series. It is recommended for men through age 26 who have sex with men or whose immune system is weakened because of HIV infection, other illness, or medications. The vaccine is given in 3 doses over 6 months.  · Measles, mumps, rubella (MMR). You need at least 1 dose of MMR if you were born in 1957 or later. You may also need a 2nd dose.  · Meningococcal. If you are age 19 to 21 years and a first-year college student living in a residence hall, or have one of several medical conditions, you need to get vaccinated against meningococcal disease. You may also need additional booster doses.  · Zoster (shingles). If you are age 60 years or  older, you should get this vaccine.  · Varicella (chickenpox). If you have never had chickenpox or you were vaccinated but received only 1 dose, talk to your caregiver to find out if you need this vaccine.  · Hepatitis A. You need this vaccine if you have a specific risk factor for hepatitis A virus infection, or you simply wish to be protected from this disease. The vaccine is usually given as 2 doses, 6 to 18 months apart.  · Hepatitis B. You need this vaccine if you have a specific risk factor for hepatitis B virus infection or you simply wish to be protected from this disease. The vaccine is given in 3 doses, usually over 6 months.  Preventative Service / Frequency  Ages 19 to 39  · Blood pressure check.** / Every 1 to 2 years.  · Lipid and cholesterol check.** / Every 5 years beginning at age 20.  · Hepatitis C blood test.** / For any individual with known risks for hepatitis C.  · Skin self-exam. / Monthly.  · Influenza immunization.** / Every year.  · Pneumococcal polysaccharide immunization.** / 1 to 2 doses if you smoke cigarettes or if you have certain chronic medical conditions.  · Tetanus, diphtheria, pertussis (Tdap,Td) immunization. / A one-time dose of Tdap vaccine. After that, you need a Td booster dose every 10 years.  · HPV immunization. / 3 doses over 6 months, if 26 and younger.  · Measles, mumps, rubella (MMR) immunization. / You need at least 1 dose of MMR if you were born in 1957 or later. You may also need a 2nd dose.  · Meningococcal immunization. / 1 dose if you are age 19 to 21 years and a first-year college student living in a residence hall, or have one of several medical conditions, you need to get vaccinated against meningococcal disease. You may also need additional booster doses.  · Varicella immunization.** / Consult your caregiver.  · Hepatitis A immunization.** / Consult your caregiver. 2 doses, 6 to 18 months apart.  · Hepatitis B immunization.** / Consult your caregiver. 3 doses  usually over 6 months.  Ages 40 to 64  · Blood pressure check.** / Every 1 to 2 years.  · Lipid and cholesterol check.** / Every 5 years beginning at age 20.  · Fecal occult blood test (FOBT) of stool. / Every year beginning at age 50 and continuing until age 75. You may not have   to do this test if you get colonoscopy every 10 years.  · Flexible sigmoidoscopy** or colonoscopy.** / Every 5 years for a flexible sigmoidoscopy or every 10 years for a colonoscopy beginning at age 50 and continuing until age 75.  · Hepatitis C blood test.** / For all people born from 1945 through 1965 and any individual with known risks for hepatitis C.  · Skin self-exam. / Monthly.  · Influenza immunization.** / Every year.  · Pneumococcal polysaccharide immunization.** / 1 to 2 doses if you smoke cigarettes or if you have certain chronic medical conditions.  · Tetanus, diphtheria, pertussis (Tdap/Td) immunization.** / A one-time dose of Tdap vaccine. After that, you need a Td booster dose every 10 years.  · Measles, mumps, rubella (MMR) immunization.  / You need at least 1 dose of MMR if you were born in 1957 or later. You may also need a 2nd dose.  · Varicella immunization.**/ Consult your caregiver.  · Meningococcal immunization.** / Consult your caregiver.  · Hepatitis A immunization.** / Consult your caregiver. 2 doses, 6 to 18 months apart.  · Hepatitis B immunization.** / Consult your caregiver. 3 doses, usually over 6 months.  Ages 65 and over  · Blood pressure check.** / Every 1 to 2 years.  · Lipid and cholesterol check.**/ Every 5 years beginning at age 20.  · Fecal occult blood test (FOBT) of stool. / Every year beginning at age 50 and continuing until age 75. You may not have to do this test if you get colonoscopy every 10 years.  · Flexible sigmoidoscopy** or colonoscopy.** / Every 5 years for a flexible sigmoidoscopy or every 10 years for a colonoscopy beginning at age 50 and continuing until age 75.  · Hepatitis C blood  test.** / For all people born from 1945 through 1965 and any individual with known risks for hepatitis C.  · Abdominal aortic aneurysm (AAA) screening.** / A one-time screening for ages 65 to 75 years who are current or former smokers.  · Skin self-exam. / Monthly.  · Influenza immunization.** / Every year.  · Pneumococcal polysaccharide immunization.** / 1 dose at age 65 (or older) if you have never been vaccinated.  · Tetanus, diphtheria, pertussis (Tdap, Td) immunization. / A one-time dose of Tdap vaccine if you are over 65 and have contact with an infant, are a healthcare worker, or simply want to be protected from whooping cough. After that, you need a Td booster dose every 10 years.  · Varicella immunization. ** / Consult your caregiver.  · Meningococcal immunization.** / Consult your caregiver.  · Hepatitis A immunization. ** / Consult your caregiver. 2 doses, 6 to 18 months apart.  · Hepatitis B immunization.** / Check with your caregiver. 3 doses, usually over 6 months.  **Family history and personal history of risk and conditions may change your caregiver's recommendations.  Document Released: 05/04/2001 Document Revised: 05/31/2011 Document Reviewed: 08/03/2010  [employer]® Patient Information ©2014 [employer].

## 2012-12-28 ENCOUNTER — Encounter: Payer: Self-pay | Admitting: Family Medicine

## 2012-12-28 ENCOUNTER — Encounter: Payer: Self-pay | Admitting: Internal Medicine

## 2013-01-01 ENCOUNTER — Ambulatory Visit (HOSPITAL_COMMUNITY)
Admission: RE | Admit: 2013-01-01 | Discharge: 2013-01-01 | Disposition: A | Discharge status: Home / Self Care | Payer: Medicare Other | Source: Ambulatory Visit | Attending: Family Medicine | Admitting: Family Medicine

## 2013-01-01 DIAGNOSIS — I517 Cardiomegaly: Secondary | ICD-10-CM | POA: Insufficient documentation

## 2013-01-01 DIAGNOSIS — R002 Palpitations: Secondary | ICD-10-CM

## 2013-01-01 NOTE — Progress Notes (Signed)
*  PRELIMINARY RESULTS* Echocardiogram 2D Echocardiogram has been performed.  Eric [name] 01/01/2013, 9:40 AM

## 2013-01-03 ENCOUNTER — Encounter (INDEPENDENT_AMBULATORY_CARE_PROVIDER_SITE_OTHER): Payer: Medicare Other

## 2013-01-03 ENCOUNTER — Encounter: Payer: Self-pay | Admitting: *Deleted

## 2013-01-03 ENCOUNTER — Encounter: Payer: Self-pay | Admitting: Family Medicine

## 2013-01-03 DIAGNOSIS — R002 Palpitations: Secondary | ICD-10-CM

## 2013-01-03 NOTE — Progress Notes (Signed)
Patient ID: Eric [name], male   DOB: 1945/05/05, 67 y.o.   MRN: [id number] E-Cardio verite 21 day cardiac event monitor applied to patient.

## 2013-01-08 ENCOUNTER — Encounter: Payer: Self-pay | Admitting: Family Medicine

## 2013-01-08 ENCOUNTER — Other Ambulatory Visit: Payer: Self-pay | Admitting: Family Medicine

## 2013-01-08 ENCOUNTER — Ambulatory Visit (HOSPITAL_BASED_OUTPATIENT_CLINIC_OR_DEPARTMENT_OTHER)
Admission: RE | Admit: 2013-01-08 | Discharge: 2013-01-08 | Disposition: A | Discharge status: Home / Self Care | Payer: Medicare Other | Source: Ambulatory Visit | Attending: Family Medicine | Admitting: Family Medicine

## 2013-01-08 DIAGNOSIS — M48061 Spinal stenosis, lumbar region without neurogenic claudication: Secondary | ICD-10-CM

## 2013-01-08 DIAGNOSIS — M169 Osteoarthritis of hip, unspecified: Secondary | ICD-10-CM | POA: Insufficient documentation

## 2013-01-08 DIAGNOSIS — M1610 Unilateral primary osteoarthritis, unspecified hip: Secondary | ICD-10-CM | POA: Diagnosis not present

## 2013-01-08 DIAGNOSIS — M25552 Pain in left hip: Secondary | ICD-10-CM

## 2013-01-08 DIAGNOSIS — M545 Low back pain, unspecified: Secondary | ICD-10-CM | POA: Diagnosis not present

## 2013-01-08 DIAGNOSIS — M25559 Pain in unspecified hip: Secondary | ICD-10-CM | POA: Insufficient documentation

## 2013-01-11 ENCOUNTER — Ambulatory Visit: Payer: Medicare Other | Admitting: Cardiology

## 2013-01-12 ENCOUNTER — Other Ambulatory Visit: Payer: Self-pay | Admitting: Family Medicine

## 2013-01-16 DIAGNOSIS — Z23 Encounter for immunization: Secondary | ICD-10-CM | POA: Diagnosis not present

## 2013-01-25 ENCOUNTER — Encounter: Payer: Self-pay | Admitting: Family Medicine

## 2013-01-25 ENCOUNTER — Other Ambulatory Visit: Payer: Self-pay

## 2013-02-01 ENCOUNTER — Ambulatory Visit (INDEPENDENT_AMBULATORY_CARE_PROVIDER_SITE_OTHER): Payer: Medicare Other | Admitting: Cardiology

## 2013-02-01 ENCOUNTER — Encounter: Payer: Self-pay | Admitting: Cardiology

## 2013-02-01 VITALS — BP 166/82 | HR 86 | Ht 70.0 in | Wt 291.0 lb

## 2013-02-01 DIAGNOSIS — R002 Palpitations: Secondary | ICD-10-CM | POA: Diagnosis not present

## 2013-02-01 NOTE — Progress Notes (Signed)
HPI  The patient presents for evaluation of palpitations. He has no prior cardiac history. About 2 or 3 months ago he was having palpitations he thought any trigeminal pattern.  He thought it might be medication related as he was taking some Zyrtec. He stop this and his caffeine. However, the palpitations persisted until he eventually started to go away by themselves though he still having them sporadically. Is having a couple times per month but not in the same regularity or frequency. He did not describe sustained rapid heart rates. He hasn't been having any chest pressure, neck or arm discomfort. He hasn't had any associated presyncope or syncope. Describe any PND or orthopnea. He has an exercise but he's been able to do some hunting without bringing on any symptoms. He did have an echocardiogram which I reviewed. This demonstrated no significant abnormalities.  He had mild LVH. I reviewed an event monitor which demonstrated some rare PACs but no other arrhythmias.  No Known Allergies  Current Outpatient Prescriptions  Medication Sig Dispense Refill  . aspirin 81 MG EC tablet Take 81 mg by mouth daily.        [name] atorvastatin (LIPITOR) 40 MG tablet TAKE 1 TABLET DAILY  90 tablet  2  . Coenzyme Q-10 200 MG CAPS Take 1 each by mouth daily.      . fluocinonide ointment (LIDEX) 0.05 % Apply 1 application topically as needed.       . meloxicam (MOBIC) 15 MG tablet Take 1 tablet (15 mg total) by mouth daily.  90 tablet  1  . Multiple Vitamin (MULTIVITAMIN PO)        . mupirocin cream (BACTROBAN) 2 % Apply topically as needed.       [name] NEXIUM 40 MG capsule TAKE 1 CAPSULE DAILY  90 capsule  3  . trimethobenzamide (TIGAN) 300 MG capsule Take 300 mg by mouth as needed.        No current facility-administered medications for this visit.    Past Medical History  Diagnosis Date  . Internal hemorrhoids without mention of complication   . Osteoarthrosis, unspecified whether generalized or localized,  unspecified site   . Family history of malignant neoplasm of breast   . GERD (gastroesophageal reflux disease)   . Hyperlipemia   . Colon polyps   . Diverticulosis of colon     Past Surgical History  Procedure Laterality Date  . Shoulder surgery  08-03-2007    right  . Tonsillectomy      Family History  Problem Relation Age of Onset  . Breast cancer    . Prostate cancer    . Arthritis Mother   . Cancer Mother 95    breast  . Hyperlipidemia Mother   . Colon polyps Mother   . Heart disease Father     CAD--passed away 08/03/2011  . Hyperlipidemia Father   . Hypertension Father   . Cancer Sister 66    breast  . Cancer Brother 64    prostate    History   Social History  . Marital Status: Married    Spouse Name: N/A    Number of Children: N/A  . Years of Education: N/A   Occupational History  . retired--[employer]    Social History Main Topics  . Smoking status: Never Smoker   . Smokeless tobacco: Not on file  . Alcohol Use: 0.0 oz/week     Comment: rare----12 a year  . Drug Use: No  . Sexual  Activity: Yes    Partners: Female   Other Topics Concern  . Not on file   Social History Narrative  . No narrative on file    ROS: positive for decreased vision, hearing aids, cough, occasional sputum production, reflux, ankle swelling, joint pains. Otherwise as stated in the HPI and negative for all other systems.  PHYSICAL EXAM BP 166/82  Pulse 86  Ht 5\' 10"  (1.778 m)  Wt 291 lb (131.997 kg)  BMI 41.75 kg/m2  SpO2 94% GENERAL:  Well appearing HEENT:  Pupils equal round and reactive, fundi not visualized, oral mucosa unremarkable NECK:  No jugular venous distention, waveform within normal limits, carotid upstroke brisk and symmetric, no bruits, no thyromegaly LYMPHATICS:  No cervical, inguinal adenopathy LUNGS:  Clear to auscultation bilaterally BACK:  No CVA tenderness CHEST:  Unremarkable HEART:  PMI not displaced or sustained,S1 and S2 within normal  limits, no S3, no S4, no clicks, no rubs, NO murmurs ABD:  Flat, positive bowel sounds normal in frequency in pitch, no bruits, no rebound, no guarding, no midline pulsatile mass, no hepatomegaly, no splenomegaly, obese EXT:  2 plus pulses throughout, no edema, no cyanosis no clubbing SKIN:  No rashes no nodules NEURO:  Cranial nerves II through XII grossly intact, motor grossly intact throughout PSYCH:  Cognitively intact, oriented to person place and time  EKG:  Sinus rhythm, rate 60, axis within normal limits, intervals within normal limits, no acute ST-T wave changes.  12/25/12  ASSESSMENT AND PLAN  PACs:  We discussed these at length. There are no other sustained dysrhythmias. For now we will manage these without further medication changes. However they get worse we could consider calcium channel blocker or beta blocker. He let a know.  I would like to evaluate him with an exercise treadmill test to otherwise make sure he is a structurally normal heart. I will bring the patient back for a POET (Plain Old Exercise Test). This will allow me to screen for obstructive coronary disease, risk stratify and very importantly provide a prescription for exercise.  HTN:  The blood pressure is hight.  I have instructed the patient to record a blood pressure diary and recording this. This will be presented for my review and pending these results I will make further suggestions about changes in therapy for optimal blood pressure control.  OVERWEIGHT:  The patient understands the need to lose weight with diet and exercise. We have discussed specific strategies for this.

## 2013-02-01 NOTE — Patient Instructions (Signed)
The current medical regimen is effective;  continue present plan and medications.  Your physician has requested that you have an exercise tolerance test. For further information please visit [email or web address]. Please also follow instruction sheet, as given.   

## 2013-03-06 ENCOUNTER — Telehealth: Payer: Self-pay | Admitting: *Deleted

## 2013-03-06 NOTE — Telephone Encounter (Signed)
Dr Marina Goodell: pt scheduled for direct colonoscopy Monday 03/19/13.  Previous colonoscopy 2006 (tubular adenoma) and 2009 with Dr. Glendora Score in [place], [place].  Both reports are in EPIC under procedures.  Please review; pt okay for colon now?  Thanks, Olegario Messier

## 2013-03-06 NOTE — Telephone Encounter (Signed)
noted 

## 2013-03-06 NOTE — Telephone Encounter (Signed)
Keep followup as recommended. Thank you

## 2013-03-07 ENCOUNTER — Encounter: Payer: Self-pay | Admitting: Family Medicine

## 2013-03-07 ENCOUNTER — Ambulatory Visit (AMBULATORY_SURGERY_CENTER): Payer: Self-pay | Admitting: *Deleted

## 2013-03-07 VITALS — Ht 70.0 in | Wt 292.2 lb

## 2013-03-07 DIAGNOSIS — E7800 Pure hypercholesterolemia, unspecified: Secondary | ICD-10-CM

## 2013-03-07 DIAGNOSIS — Z86010 Personal history of colonic polyps: Secondary | ICD-10-CM

## 2013-03-07 MED ORDER — ATORVASTATIN CALCIUM 40 MG PO TABS: ORAL_TABLET | ORAL | Status: DC

## 2013-03-07 MED ORDER — MOVIPREP 100 G PO SOLR: ORAL | Status: DC

## 2013-03-07 NOTE — Progress Notes (Signed)
No allergies to eggs or soy. No problems with anesthesia.  

## 2013-03-13 ENCOUNTER — Ambulatory Visit (INDEPENDENT_AMBULATORY_CARE_PROVIDER_SITE_OTHER): Payer: Medicare Other | Admitting: Physician Assistant

## 2013-03-13 DIAGNOSIS — R002 Palpitations: Secondary | ICD-10-CM

## 2013-03-13 DIAGNOSIS — I491 Atrial premature depolarization: Secondary | ICD-10-CM

## 2013-03-13 DIAGNOSIS — R9439 Abnormal result of other cardiovascular function study: Secondary | ICD-10-CM

## 2013-03-13 NOTE — Progress Notes (Signed)
Exercise Treadmill Test  Pre-Exercise Testing Evaluation Rhythm: normal sinus  Rate: 62 bpm     Test  Exercise Tolerance Test Ordering MD: Angelina Sheriff, MD  Interpreting MD: Tereso Newcomer, PA-C  Unique Test No: 1  Treadmill:  1  Indication for ETT: palps  Contraindication to ETT: No   Stress Modality: exercise - treadmill  Cardiac Imaging Performed: non   Protocol: standard Bruce - maximal  Max BP:  198/47  Max MPHR (bpm):  154 85% MPR (bpm):  131  MPHR obtained (bpm):   % MPHR obtained:    Reached 85% MPHR (min:sec):  3:12 Total Exercise Time (min-sec):  5:00  Workload in METS:  7 Borg Scale: 14  Reason ETT Terminated:  patient's desire to stop    ST Segment Analysis At Rest: normal ST segments - no evidence of significant ST depression With Exercise: significant ischemic ST depression  Other Information Arrhythmia:  Yes Angina during ETT:  absent (0) Quality of ETT:  diagnostic  ETT Interpretation:  abnormal - evidence of ST depression consistent with ischemia  Comments: Fair exercise capacity. No chest pain. Normal BP response to exercise. There was 1-2 mm of down-sloping ST depression at peak exercise that persisted for 2-3 minutes into recovery. There was frequent PACs in recovery and 2-3 short runs of SVT. He had poor HR recovery in 1st minute post exercise at 2 mph on 2 % grade.   Recommendations: Will schedule Lexiscan Myoview. F/u with Dr. Rollene Rotunda as directed. Signed,  Tereso Newcomer, PA-C   03/13/2013 11:34 AM

## 2013-03-13 NOTE — Patient Instructions (Signed)
Your physician has requested that you have a lexiscan myoview. For further information please visit [email or web address]. Please follow instruction sheet, as given.   

## 2013-03-19 ENCOUNTER — Ambulatory Visit (AMBULATORY_SURGERY_CENTER): Payer: Medicare Other | Admitting: Internal Medicine

## 2013-03-19 ENCOUNTER — Encounter: Payer: Self-pay | Admitting: Internal Medicine

## 2013-03-19 VITALS — BP 123/75 | HR 48 | Temp 98.2°F | Resp 16 | Ht 70.0 in | Wt 292.0 lb

## 2013-03-19 DIAGNOSIS — Z86010 Personal history of colonic polyps: Secondary | ICD-10-CM

## 2013-03-19 DIAGNOSIS — D126 Benign neoplasm of colon, unspecified: Secondary | ICD-10-CM | POA: Diagnosis not present

## 2013-03-19 DIAGNOSIS — G4730 Sleep apnea, unspecified: Secondary | ICD-10-CM | POA: Diagnosis not present

## 2013-03-19 MED ORDER — SODIUM CHLORIDE 0.9 % IV SOLN: 500.0000 mL | INTRAVENOUS | Status: DC

## 2013-03-19 NOTE — Progress Notes (Signed)
Called to room to assist during endoscopic procedure.  Patient ID and intended procedure confirmed with present staff. Received instructions for my participation in the procedure from the performing physician.  

## 2013-03-19 NOTE — Progress Notes (Signed)
Procedure ends, to recovery, report given and VSS. 

## 2013-03-19 NOTE — Patient Instructions (Signed)
YOU HAD AN ENDOSCOPIC PROCEDURE TODAY AT THE [facility]: Refer to the procedure report that was given to you for any specific questions about what was found during the examination.  If the procedure report does not answer your questions, please call your gastroenterologist to clarify.  If you requested that your care partner not be given the details of your procedure findings, then the procedure report has been included in a sealed envelope for you to review at your convenience later.  YOU SHOULD EXPECT: Some feelings of bloating in the abdomen. Passage of more gas than usual.  Walking can help get rid of the air that was put into your GI tract during the procedure and reduce the bloating. If you had a lower endoscopy (such as a colonoscopy or flexible sigmoidoscopy) you may notice spotting of blood in your stool or on the toilet paper. If you underwent a bowel prep for your procedure, then you may not have a normal bowel movement for a few days.  DIET: Your first meal following the procedure should be a light meal and then it is ok to progress to your normal diet.  A half-sandwich or bowl of soup is an example of a good first meal.  Heavy or fried foods are harder to digest and may make you feel nauseous or bloated.  Likewise meals heavy in dairy and vegetables can cause extra gas to form and this can also increase the bloating.  Drink plenty of fluids but you should avoid alcoholic beverages for 24 hours.  ACTIVITY: Your care partner should take you home directly after the procedure.  You should plan to take it easy, moving slowly for the rest of the day.  You can resume normal activity the day after the procedure however you should NOT DRIVE or use heavy machinery for 24 hours (because of the sedation medicines used during the test).    SYMPTOMS TO REPORT IMMEDIATELY: A gastroenterologist can be reached at any hour.  During normal business hours, 8:30 AM to 5:00 PM Monday through Friday,  call [phone].  After hours and on weekends, please call the GI answering service at [phone] who will take a message and have the physician on call contact you.   Following lower endoscopy (colonoscopy or flexible sigmoidoscopy):  Excessive amounts of blood in the stool  Significant tenderness or worsening of abdominal pains  Swelling of the abdomen that is new, acute  Fever of 100F or higher    FOLLOW UP: If any biopsies were taken you will be contacted by phone or by letter within the next 1-3 weeks.  Call your gastroenterologist if you have not heard about the biopsies in 3 weeks.  Our staff will call the home number listed on your records the next business day following your procedure to check on you and address any questions or concerns that you may have at that time regarding the information given to you following your procedure. This is a courtesy call and so if there is no answer at the home number and we have not heard from you through the emergency physician on call, we will assume that you have returned to your regular daily activities without incident.  SIGNATURES/CONFIDENTIALITY: You and/or your care partner have signed paperwork which will be entered into your electronic medical record.  These signatures attest to the fact that that the information above on your After Visit Summary has been reviewed and is understood.  Full responsibility of the confidentiality   of this discharge information lies with you and/or your care-partner.     

## 2013-03-19 NOTE — Op Note (Signed)
[facility] [address] [place], [address]   COLONOSCOPY PROCEDURE REPORT  PATIENT: [name], Eric  MR#: [id number] BIRTHDATE: 06/21/1945 , 67  yrs. old GENDER: Male ENDOSCOPIST: Roxy Cedar, MD REFERRED [id number] Program Recall PROCEDURE DATE:  03/19/2013 PROCEDURE:   Colonoscopy with snare polypectomy x 1 First Screening Colonoscopy - Avg.  risk and is 50 yrs.  old or older - No.  Prior Negative Screening - Now for repeat screening. N/A  History of Adenoma - Now for follow-up colonoscopy & has been > or = to 3 yrs.  Yes hx of adenoma.  Has been 3 or more years since last colonoscopy.  Polyps Removed Today? Yes. ASA CLASS:   Class II INDICATIONS:Patient's personal history of adenomatous colon polyps. Prior exams elsewhere (2006 w/ TA; f/u 2009) MEDICATIONS: MAC sedation, administered by CRNA and propofol (Diprivan) 270mg  IV  DESCRIPTION OF PROCEDURE:   After the risks benefits and alternatives of the procedure were thoroughly explained, informed consent was obtained.  A digital rectal exam revealed no abnormalities of the rectum.   The LB [id number]  endoscope was introduced through the anus and advanced to the cecum, which was identified by both the appendix and ileocecal valve. No adverse events experienced.   The quality of the prep was excellent, using MoviPrep  The instrument was then slowly withdrawn as the colon was fully examined.  COLON FINDINGS: A single polyp, 7mm in size was found in the ascending colon.  A polypectomy was performed with a cold snare. The resection was complete and the polyp tissue was completely retrieved.   Moderate diverticulosis was noted in the sigmoid colon.   The colon mucosa was otherwise normal.  Retroflexed views revealed internal hemorrhoids. The time to cecum=3 minutes 18 seconds.  Withdrawal time=9 minutes 35 seconds.  The scope was withdrawn and the procedure completed. COMPLICATIONS: There  were no complications.  ENDOSCOPIC IMPRESSION: 1.   7mm Colon polyp was found; polypectomy was performed with a cold snare 2.   Moderate diverticulosis was noted in the sigmoid colon 3.   The colon mucosa was otherwise normal  RECOMMENDATIONS: 1. Follow up colonoscopy in 5 years   eSigned:  Roxy Cedar, MD 03/19/2013 8:45 AM   cc: Lelon Perla, DO and The Patient

## 2013-03-20 ENCOUNTER — Telehealth: Payer: Self-pay

## 2013-03-20 NOTE — Telephone Encounter (Signed)
  Follow up Call-  Call back number 03/19/2013  Post procedure Call Back phone  # [phone]  Permission to leave phone message Yes     Patient questions:  Do you have a fever, pain , or abdominal swelling? no Pain Score  0 *  Have you tolerated food without any problems? yes  Have you been able to return to your normal activities? yes  Do you have any questions about your discharge instructions: Diet   no Medications  no Follow up visit  no  Do you have questions or concerns about your Care? no  Actions: * If pain score is 4 or above: No action needed, pain <4.

## 2013-03-23 ENCOUNTER — Encounter: Payer: Self-pay | Admitting: Internal Medicine

## 2013-04-05 ENCOUNTER — Other Ambulatory Visit: Payer: Self-pay | Admitting: Family Medicine

## 2013-04-05 NOTE — Telephone Encounter (Signed)
Last seen 12/25/12 and filled 06/26/12 #90 with 1 refill. Please advise      KP

## 2013-04-23 ENCOUNTER — Encounter: Payer: Self-pay | Admitting: Cardiovascular Disease

## 2013-04-23 ENCOUNTER — Ambulatory Visit (HOSPITAL_COMMUNITY): Payer: Medicare Other | Attending: Cardiovascular Disease | Admitting: Radiology

## 2013-04-23 VITALS — BP 144/71 | HR 55 | Ht 70.0 in | Wt 286.0 lb

## 2013-04-23 DIAGNOSIS — E785 Hyperlipidemia, unspecified: Secondary | ICD-10-CM | POA: Diagnosis not present

## 2013-04-23 DIAGNOSIS — R0602 Shortness of breath: Secondary | ICD-10-CM

## 2013-04-23 DIAGNOSIS — R0609 Other forms of dyspnea: Secondary | ICD-10-CM | POA: Diagnosis not present

## 2013-04-23 DIAGNOSIS — R0989 Other specified symptoms and signs involving the circulatory and respiratory systems: Secondary | ICD-10-CM | POA: Diagnosis not present

## 2013-04-23 DIAGNOSIS — R002 Palpitations: Secondary | ICD-10-CM | POA: Insufficient documentation

## 2013-04-23 DIAGNOSIS — R9439 Abnormal result of other cardiovascular function study: Secondary | ICD-10-CM | POA: Diagnosis not present

## 2013-04-23 DIAGNOSIS — Z8249 Family history of ischemic heart disease and other diseases of the circulatory system: Secondary | ICD-10-CM | POA: Diagnosis not present

## 2013-04-23 MED ORDER — TECHNETIUM TC 99M SESTAMIBI GENERIC - CARDIOLITE
30.0000 | Freq: Once | INTRAVENOUS | Status: AC | PRN
  Administered 2013-04-23: 30 via INTRAVENOUS

## 2013-04-23 MED ORDER — REGADENOSON 0.4 MG/5ML IV SOLN
0.4000 mg | Freq: Once | INTRAVENOUS | Status: AC
  Administered 2013-04-23: 0.4 mg via INTRAVENOUS

## 2013-04-23 NOTE — Progress Notes (Signed)
[facility] [address] [place], [place] [address] [phone]    Cardiology Nuclear Med Study  Kham Zuckerman is a 68 y.o. male     MRN : [id number]     DOB: 09/18/1945  Procedure Date: 04/23/2013  Nuclear Med Background Indication for Stress Test:  Evaluation for Ischemia and 03-13-13 Abnormal ETT: 1-2 mm ST depression at peak exercise History:  No prior known history of CAD, 5-10 yrs ago (near [place], [place]): Myocardial Perfusion Imaging-Ok per patient, and 01-01-2013 Echo: EF=60% Cardiac Risk Factors: Family History - CAD and Lipids  Symptoms:  DOE and Palpitations   Nuclear Pre-Procedure Caffeine/Decaff Intake:  None NPO After: 7:00pm   Lungs:  clear O2 Sat: 98% on room air. IV 0.9% NS with Angio Cath:  22g  IV Site: R Wrist x 1, tolerated well IV Started by:  Irven Baltimore, RN  Chest Size (in):  56 Cup Size: n/a  Height: 5\' 10"  (1.778 m)  Weight:  286 lb (129.729 kg)  BMI:  Body mass index is 41.04 kg/(m^2). Tech Comments:  N/A    Nuclear Med Study 1 or 2 day study: 2 day  Stress Test Type:  Treadmill/Lexiscan  Reading MD: N/A  Order Authorizing Provider:  Minus Breeding, MD, and Richardson Dopp, [facility]  Resting Radionuclide: Technetium 3m Sestamibi  Resting Radionuclide Dose: 33.0 mCi 04/26/13  Stress Radionuclide:  Technetium 18m Sestamibi  Stress Radionuclide Dose: 33.0 mCi  04/23/13          Stress Protocol Rest HR: 55 Stress HR: 107  Rest BP: 144/71 Stress BP: 177/66  Exercise Time (min): 2:00 METS: n/a   Predicted Max HR: 154 bpm % Max HR: 69.48 bpm Rate Pressure Product: 18939   Dose of Adenosine (mg):  n/a Dose of Lexiscan: 0.4 mg  Dose of Atropine (mg): n/a Dose of Dobutamine: n/a mcg/kg/min (at max HR)  Stress Test Technologist: Irven Baltimore, RN  Nuclear Technologist:  Annye Rusk, CNMT     Rest Procedure:  Myocardial perfusion imaging was performed at rest 45 minutes following the intravenous administration of Technetium  43m Sestamibi. Rest ECG: Sinus bradycardia, rate 55bpm, no ST changes.   Stress Procedure:  The patient received IV Lexiscan 0.4 mg over 15-seconds with concurrent low level exercise and then Technetium 85m Sestamibi was injected at 30-seconds while the patient continued walking one more minute.  The patient complained of DOE, leg fatigue, but denied chest pain.Quantitative spect images were obtained after a 45-minute delay. Stress ECG: No significant change from baseline ECG  QPS Raw Data Images:  Moderate diaphragmatic and soft tissue attenuation noted.  Stress Images:  Normal homogeneous uptake in all areas of the myocardium. Rest Images:  Normal homogeneous uptake in all areas of the myocardium. Subtraction (SDS):  No evidence of ischemia. Transient Ischemic Dilatation (Normal <1.22):  1.11 Lung/Heart Ratio (Normal <0.45):  .31  Quantitative Gated Spect Images QGS EDV:  135 ml QGS ESV:  53 ml  Impression Exercise Capacity:  Lexiscan with low level exercise. BP Response:  Normal blood pressure response. Clinical Symptoms:  There is dyspnea. ECG Impression:  No significant ST segment change suggestive of ischemia. (No ST segment changes as seen previously according to history).  Comparison with Prior Nuclear Study: No images to compare  Overall Impression:  Low risk stress nuclear study no ischemia. .  LV Ejection Fraction: 61%.  LV Wall Motion:  NL LV Function; NL Wall Motion Upper normal LV chamber size (  157ml).  Sensitivity and specificity of study reduced by noted attenuation however this did not seem to greatly affect study interpretation.    Candee Furbish, MD

## 2013-04-26 ENCOUNTER — Ambulatory Visit (HOSPITAL_COMMUNITY): Payer: Medicare Other | Attending: Cardiology

## 2013-04-26 DIAGNOSIS — R0989 Other specified symptoms and signs involving the circulatory and respiratory systems: Secondary | ICD-10-CM

## 2013-04-26 MED ORDER — TECHNETIUM TC 99M SESTAMIBI GENERIC - CARDIOLITE
33.0000 | Freq: Once | INTRAVENOUS | Status: AC | PRN
  Administered 2013-04-26: 33 via INTRAVENOUS

## 2013-04-27 ENCOUNTER — Encounter: Payer: Self-pay | Admitting: Physician Assistant

## 2013-04-27 ENCOUNTER — Telehealth: Payer: Self-pay | Admitting: *Deleted

## 2013-04-27 NOTE — Telephone Encounter (Signed)
pt's wife notified about normal myoview and said she will let her husband know.

## 2013-04-28 ENCOUNTER — Encounter: Payer: Self-pay | Admitting: Family Medicine

## 2013-05-22 ENCOUNTER — Ambulatory Visit (HOSPITAL_BASED_OUTPATIENT_CLINIC_OR_DEPARTMENT_OTHER)
Admission: RE | Admit: 2013-05-22 | Discharge: 2013-05-22 | Disposition: A | Discharge status: Home / Self Care | Payer: Medicare Other | Source: Ambulatory Visit | Attending: Family Medicine | Admitting: Family Medicine

## 2013-05-22 DIAGNOSIS — M48061 Spinal stenosis, lumbar region without neurogenic claudication: Secondary | ICD-10-CM | POA: Diagnosis not present

## 2013-05-22 DIAGNOSIS — M47817 Spondylosis without myelopathy or radiculopathy, lumbosacral region: Secondary | ICD-10-CM | POA: Diagnosis not present

## 2013-05-22 DIAGNOSIS — M5137 Other intervertebral disc degeneration, lumbosacral region: Secondary | ICD-10-CM | POA: Diagnosis not present

## 2013-05-24 ENCOUNTER — Encounter: Payer: Self-pay | Admitting: *Deleted

## 2013-05-24 NOTE — Progress Notes (Signed)
Letter sent with MRI results and note from Dr. Etter Sjogren attached.

## 2013-07-01 ENCOUNTER — Encounter: Payer: Self-pay | Admitting: Family Medicine

## 2013-07-01 DIAGNOSIS — E7800 Pure hypercholesterolemia, unspecified: Secondary | ICD-10-CM

## 2013-07-01 DIAGNOSIS — E785 Hyperlipidemia, unspecified: Secondary | ICD-10-CM

## 2013-07-02 MED ORDER — ATORVASTATIN CALCIUM 40 MG PO TABS: ORAL_TABLET | ORAL | Status: DC

## 2013-07-11 ENCOUNTER — Other Ambulatory Visit (INDEPENDENT_AMBULATORY_CARE_PROVIDER_SITE_OTHER): Payer: Medicare Other

## 2013-07-11 DIAGNOSIS — E785 Hyperlipidemia, unspecified: Secondary | ICD-10-CM

## 2013-07-11 LAB — LIPID PANEL
Cholesterol: 133 mg/dL (ref 0–200)
HDL: 43.6 mg/dL (ref >39.00)
LDL Cholesterol: 72 mg/dL (ref 0–99)
Total CHOL/HDL Ratio: 3
Triglycerides: 88 mg/dL (ref 0.0–149.0)
VLDL: 17.6 mg/dL (ref 0.0–40.0)

## 2013-07-11 LAB — HEPATIC FUNCTION PANEL
ALT: 22 U/L (ref 0–53)
AST: 20 U/L (ref 0–37)
Albumin: 3.9 g/dL (ref 3.5–5.2)
Alkaline Phosphatase: 57 U/L (ref 39–117)
Bilirubin, Direct: 0.1 mg/dL (ref 0.0–0.3)
Total Bilirubin: 0.9 mg/dL (ref 0.3–1.2)
Total Protein: 6.8 g/dL (ref 6.0–8.3)

## 2013-07-24 DIAGNOSIS — H5200 Hypermetropia, unspecified eye: Secondary | ICD-10-CM | POA: Diagnosis not present

## 2013-07-24 DIAGNOSIS — H2510 Age-related nuclear cataract, unspecified eye: Secondary | ICD-10-CM | POA: Diagnosis not present

## 2013-07-24 DIAGNOSIS — H524 Presbyopia: Secondary | ICD-10-CM | POA: Diagnosis not present

## 2013-09-17 ENCOUNTER — Encounter: Payer: Self-pay | Admitting: Family Medicine

## 2013-11-22 ENCOUNTER — Other Ambulatory Visit: Payer: Self-pay | Admitting: Family Medicine

## 2013-11-26 DIAGNOSIS — Z23 Encounter for immunization: Secondary | ICD-10-CM | POA: Diagnosis not present

## 2013-11-29 ENCOUNTER — Encounter: Payer: Self-pay | Admitting: Family Medicine

## 2013-12-04 ENCOUNTER — Other Ambulatory Visit: Payer: Self-pay | Admitting: Family Medicine

## 2013-12-11 ENCOUNTER — Other Ambulatory Visit: Payer: Self-pay | Admitting: Family Medicine

## 2013-12-24 ENCOUNTER — Encounter: Payer: Self-pay | Admitting: Family Medicine

## 2013-12-26 ENCOUNTER — Encounter: Payer: Medicare Other | Admitting: Family Medicine

## 2014-02-03 ENCOUNTER — Other Ambulatory Visit: Payer: Self-pay | Admitting: Family Medicine

## 2014-02-11 ENCOUNTER — Ambulatory Visit (INDEPENDENT_AMBULATORY_CARE_PROVIDER_SITE_OTHER): Payer: Medicare Other | Admitting: Family Medicine

## 2014-02-11 ENCOUNTER — Other Ambulatory Visit (INDEPENDENT_AMBULATORY_CARE_PROVIDER_SITE_OTHER): Payer: Medicare Other

## 2014-02-11 ENCOUNTER — Encounter: Payer: Self-pay | Admitting: Family Medicine

## 2014-02-11 VITALS — BP 158/84 | HR 64 | Temp 98.3°F | Ht 70.0 in | Wt 293.4 lb

## 2014-02-11 DIAGNOSIS — B356 Tinea cruris: Secondary | ICD-10-CM | POA: Diagnosis not present

## 2014-02-11 DIAGNOSIS — M25511 Pain in right shoulder: Secondary | ICD-10-CM

## 2014-02-11 DIAGNOSIS — Z0000 Encounter for general adult medical examination without abnormal findings: Secondary | ICD-10-CM

## 2014-02-11 DIAGNOSIS — E785 Hyperlipidemia, unspecified: Secondary | ICD-10-CM

## 2014-02-11 DIAGNOSIS — K648 Other hemorrhoids: Secondary | ICD-10-CM | POA: Diagnosis not present

## 2014-02-11 DIAGNOSIS — R350 Frequency of micturition: Secondary | ICD-10-CM | POA: Diagnosis not present

## 2014-02-11 DIAGNOSIS — I10 Essential (primary) hypertension: Secondary | ICD-10-CM

## 2014-02-11 DIAGNOSIS — R110 Nausea: Secondary | ICD-10-CM | POA: Diagnosis not present

## 2014-02-11 DIAGNOSIS — Z23 Encounter for immunization: Secondary | ICD-10-CM | POA: Diagnosis not present

## 2014-02-11 LAB — LIPID PANEL
Cholesterol: 148 mg/dL (ref 0–200)
HDL: 45.2 mg/dL (ref >39.00)
LDL Cholesterol: 86 mg/dL (ref 0–99)
NonHDL: 102.8
Total CHOL/HDL Ratio: 3
Triglycerides: 86 mg/dL (ref 0.0–149.0)
VLDL: 17.2 mg/dL (ref 0.0–40.0)

## 2014-02-11 LAB — CBC WITH DIFFERENTIAL/PLATELET
Basophils Absolute: 0 10*3/uL (ref 0.0–0.1)
Basophils Relative: 0.5 % (ref 0.0–3.0)
Eosinophils Absolute: 0.1 10*3/uL (ref 0.0–0.7)
Eosinophils Relative: 0.8 % (ref 0.0–5.0)
HCT: 47.7 % (ref 39.0–52.0)
Hemoglobin: 15.4 g/dL (ref 13.0–17.0)
Lymphocytes Relative: 24.6 % (ref 12.0–46.0)
Lymphs Abs: 2 10*3/uL (ref 0.7–4.0)
MCHC: 32.4 g/dL (ref 30.0–36.0)
MCV: 91.9 fl (ref 78.0–100.0)
Monocytes Absolute: 0.8 10*3/uL (ref 0.1–1.0)
Monocytes Relative: 9.5 % (ref 3.0–12.0)
Neutro Abs: 5.2 10*3/uL (ref 1.4–7.7)
Neutrophils Relative %: 64.6 % (ref 43.0–77.0)
Platelets: 202 10*3/uL (ref 150.0–400.0)
RBC: 5.19 Mil/uL (ref 4.22–5.81)
RDW: 14 % (ref 11.5–15.5)
WBC: 8 10*3/uL (ref 4.0–10.5)

## 2014-02-11 LAB — POCT URINALYSIS DIPSTICK
Bilirubin, UA: NEGATIVE
Blood, UA: NEGATIVE
Glucose, UA: NEGATIVE
Ketones, UA: NEGATIVE
Leukocytes, UA: NEGATIVE
Nitrite, UA: NEGATIVE
Protein, UA: NEGATIVE
Spec Grav, UA: 1.025
Urobilinogen, UA: 2
pH, UA: 6

## 2014-02-11 LAB — BASIC METABOLIC PANEL
BUN: 17 mg/dL (ref 6–23)
CO2: 27 mEq/L (ref 19–32)
Calcium: 9.4 mg/dL (ref 8.4–10.5)
Chloride: 102 mEq/L (ref 96–112)
Creatinine, Ser: 1.1 mg/dL (ref 0.4–1.5)
GFR: 73.89 mL/min (ref >60.00)
Glucose, Bld: 96 mg/dL (ref 70–99)
Potassium: 4.1 mEq/L (ref 3.5–5.1)
Sodium: 142 mEq/L (ref 135–145)

## 2014-02-11 LAB — HEPATIC FUNCTION PANEL
ALT: 26 U/L (ref 0–53)
AST: 20 U/L (ref 0–37)
Albumin: 4.3 g/dL (ref 3.5–5.2)
Alkaline Phosphatase: 60 U/L (ref 39–117)
Bilirubin, Direct: 0.1 mg/dL (ref 0.0–0.3)
Total Bilirubin: 0.8 mg/dL (ref 0.2–1.2)
Total Protein: 6.9 g/dL (ref 6.0–8.3)

## 2014-02-11 LAB — PSA: PSA: 0.85 ng/mL (ref 0.10–4.00)

## 2014-02-11 MED ORDER — FLUOCINONIDE 0.05 % EX OINT: 1.0000 "application " | TOPICAL_OINTMENT | CUTANEOUS | Status: DC | PRN

## 2014-02-11 MED ORDER — FLUCONAZOLE 150 MG PO TABS: ORAL_TABLET | ORAL | Status: DC

## 2014-02-11 MED ORDER — TRIMETHOBENZAMIDE HCL 300 MG PO CAPS: 300.0000 mg | ORAL_CAPSULE | ORAL | Status: DC | PRN

## 2014-02-11 MED ORDER — LISINOPRIL 10 MG PO TABS: 10.0000 mg | ORAL_TABLET | Freq: Every day | ORAL | Status: DC

## 2014-02-11 MED ORDER — MUPIROCIN CALCIUM 2 % EX CREA: TOPICAL_CREAM | CUTANEOUS | Status: DC | PRN

## 2014-02-11 NOTE — Progress Notes (Signed)
Pre visit review using our clinic review tool, if applicable. No additional management support is needed unless otherwise documented below in the visit note. 

## 2014-02-11 NOTE — Progress Notes (Addendum)
Subjective:    Eric [name] is a 68 y.o. male who presents for Medicare Annual/Subsequent preventive examination.   Preventive Screening-Counseling & Management  Tobacco History  Smoking status  . Never Smoker   Smokeless tobacco  . Never Used    Problems Prior to Visit 1. none  Current Problems (verified) Patient Active Problem List   Diagnosis Date Noted  . Palpitations 12/25/2012  . Left hip pain 12/25/2012  . Severe obesity (BMI >= 40) 12/25/2012  . Acute bronchitis 12/25/2012  . Urgency of urination 12/24/2011  . Urinary frequency 12/24/2011  . Preventative health care 10/08/2010  . Hyperlipidemia 10/08/2010  . DECREASED HEARING 09/04/2009  . INTERNAL HEMORRHOIDS WITHOUT MENTION COMP 09/04/2009  . ALLERGIC RHINITIS, SEASONAL 09/04/2009  . GERD 09/04/2009  . DIVERTICULOSIS, COLON 09/04/2009  . OSTEOARTHRITIS 09/04/2009  . SLEEP APNEA 09/04/2009    Medications Prior to Visit Current Outpatient Prescriptions on File Prior to Visit  Medication Sig Dispense Refill  . aspirin 81 MG EC tablet Take 81 mg by mouth daily.      [name] atorvastatin (LIPITOR) 40 MG tablet TAKE 1 TABLET DAILY 90 tablet 1  . Coenzyme Q-10 200 MG CAPS Take 1 each by mouth daily.    . meloxicam (MOBIC) 15 MG tablet TAKE 1 TABLET DAILY 90 tablet 1  . Multiple Vitamin (MULTIVITAMIN PO)      . NEXIUM 40 MG capsule TAKE 1 CAPSULE DAILY 90 capsule 1   No current facility-administered medications on file prior to visit.    Current Medications (verified) Current Outpatient Prescriptions  Medication Sig Dispense Refill  . aspirin 81 MG EC tablet Take 81 mg by mouth daily.      [name] atorvastatin (LIPITOR) 40 MG tablet TAKE 1 TABLET DAILY 90 tablet 1  . Coenzyme Q-10 200 MG CAPS Take 1 each by mouth daily.    . fluocinonide ointment (LIDEX) 4.76 % Apply 1 application topically as needed. 30 g 1  . meloxicam (MOBIC) 15 MG tablet TAKE 1 TABLET DAILY 90 tablet 1  . Multiple Vitamin (MULTIVITAMIN PO)       . mupirocin cream (BACTROBAN) 2 % Apply topically as needed. 15 g 1  . NEXIUM 40 MG capsule TAKE 1 CAPSULE DAILY 90 capsule 1  . trimethobenzamide (TIGAN) 300 MG capsule Take 1 capsule (300 mg total) by mouth as needed. 20 capsule 0  . lisinopril (PRINIVIL,ZESTRIL) 10 MG tablet Take 1 tablet (10 mg total) by mouth daily. 90 tablet 3   No current facility-administered medications for this visit.     Allergies (verified) Review of patient's allergies indicates no known allergies.   PAST HISTORY  Family History Family History  Problem Relation Age of Onset  . Breast cancer    . Prostate cancer    . Arthritis Mother   . Cancer Mother 64    breast  . Hyperlipidemia Mother   . Colon polyps Mother 90  . Heart disease Father     CAD--passed away 07/14/2011 age 29  . Hyperlipidemia Father   . Hypertension Father   . Cancer Sister 50    breast  . Cancer Brother 43    prostate  . Colon cancer Neg Hx     Social History History  Substance Use Topics  . Smoking status: Never Smoker   . Smokeless tobacco: Never Used  . Alcohol Use: 0.0 oz/week     Comment: rare----12 a year    Are there smokers in your home (other than you)?  No  Risk Factors Current exercise habits: The patient does not participate in regular exercise at present.  Dietary issues discussed: yes-- secondary to weight gain   Cardiac risk factors: advanced age (older than 34 for men, 50 for women), dyslipidemia, hypertension, male gender, obesity (BMI >= 30 kg/m2) and sedentary lifestyle.  Depression Screen (Note: if answer to either of the following is "Yes", a more complete depression screening is indicated)   Q1: Over the past two weeks, have you felt down, depressed or hopeless? No  Q2: Over the past two weeks, have you felt little interest or pleasure in doing things? No  Have you lost interest or pleasure in daily life? No  Do you often feel hopeless? No  Do you cry easily over simple problems?  No  Activities of Daily Living In your present state of health, do you have any difficulty performing the following activities?:  Driving? No Managing money?  No Feeding yourself? No Getting from bed to chair? No Climbing a flight of stairs? No Preparing food and eating?: No Bathing or showering? No Getting dressed: No Getting to the toilet? No Using the toilet:No Moving around from place to place: No In the past year have you fallen or had a near fall?:No   Are you sexually active?  Yes  Do you have more than one partner?  No  Hearing Difficulties: No Do you often ask people to speak up or repeat themselves? No Do you experience ringing or noises in your ears? No Do you have difficulty understanding soft or whispered voices? No   Do you feel that you have a problem with memory? No  Do you often misplace items? No  Do you feel safe at home?  Yes  Cognitive Testing  Alert? Yes  Normal Appearance?Yes  Oriented to person? Yes  Place? Yes   Time? Yes  Recall of three objects?  Yes  Can perform simple calculations? Yes  Displays appropriate judgment?Yes  Can read the correct time from a watch face?Yes   Advanced Directives have been discussed with the patient? Yes   List the Names of Other Physician/Practitioners you currently use: 1.  GI-- [facility]  2  opth --  [facility] 3.  Dentist-- Chipper Oman any recent Medical Services you may have received from other than [facility] providers in the past year (date may be approximate).  Immunization History  Administered Date(s) Administered  . Influenza Split 12/24/2011  . Influenza Whole 01/01/2009  . Influenza, High Dose Seasonal PF 11/26/2013  . Influenza,inj,Quad PF,36+ Mos 01/15/2013  . Pneumococcal Polysaccharide-23 12/24/2011  . Td 05/01/2008  . Zoster 10/08/2010    Screening Tests Health Maintenance  Topic Date Due  . INFLUENZA VACCINE  10/21/2014  . COLONOSCOPY  03/19/2018  . TETANUS/TDAP  05/01/2018  .  PNEUMOCOCCAL POLYSACCHARIDE VACCINE AGE 29 AND OVER  Completed  . ZOSTAVAX  Completed    All answers were reviewed with the patient and necessary referrals were made:  Garnet Koyanagi, DO   02/11/2014   History reviewed:  He  has a past medical history of Internal hemorrhoids without mention of complication; Osteoarthrosis, unspecified whether generalized or localized, unspecified site; GERD (gastroesophageal reflux disease); Hyperlipemia; Colon polyps; Diverticulosis of colon; Sleep apnea; and cardiovascular stress test. He  does not have any pertinent problems on file. He  has past surgical history that includes Shoulder surgery (2009) and Tonsillectomy. His family history includes Arthritis in his mother; Breast cancer in an other family member; Cancer (age  of onset: 17) in his sister; Cancer (age of onset: 29) in his mother; Cancer (age of onset: 3) in his brother; Colon polyps (age of onset: 5) in his mother; Heart disease in his father; Hyperlipidemia in his father and mother; Hypertension in his father; Prostate cancer in an other family member. There is no history of Colon cancer. He  reports that he has never smoked. He has never used smokeless tobacco. He reports that he drinks alcohol. He reports that he does not use illicit drugs. He has a current medication list which includes the following prescription(s): aspirin, atorvastatin, coenzyme q-10, fluocinonide ointment, meloxicam, multiple vitamins-minerals, mupirocin cream, nexium, trimethobenzamide, and lisinopril. Current Outpatient Prescriptions on File Prior to Visit  Medication Sig Dispense Refill  . aspirin 81 MG EC tablet Take 81 mg by mouth daily.      [name] atorvastatin (LIPITOR) 40 MG tablet TAKE 1 TABLET DAILY 90 tablet 1  . Coenzyme Q-10 200 MG CAPS Take 1 each by mouth daily.    . meloxicam (MOBIC) 15 MG tablet TAKE 1 TABLET DAILY 90 tablet 1  . Multiple Vitamin (MULTIVITAMIN PO)      . NEXIUM 40 MG capsule TAKE 1 CAPSULE  DAILY 90 capsule 1   No current facility-administered medications on file prior to visit.   He has No Known Allergies.  Review of Systems Review of Systems  Constitutional: Negative for activity change, appetite change and fatigue.  HENT: Negative for hearing loss, congestion, tinnitus and ear discharge.  dentist q54m Eyes: Negative for visual disturbance (see optho q1y -- vision corrected to 20/20 with glasses).  Respiratory: Negative for cough, chest tightness and shortness of breath.   Cardiovascular: Negative for chest pain, palpitations and leg swelling.  Gastrointestinal: Negative for abdominal pain, diarrhea, constipation and abdominal distention.  Genitourinary: Negative for urgency, frequency, decreased urine volume and difficulty urinating.  Musculoskeletal: Negative for back pain, arthralgias and gait problem.  Skin: Negative for color change, pallor and rash.  Neurological: Negative for dizziness, light-headedness, numbness and headaches.  Hematological: Negative for adenopathy. Does not bruise/bleed easily.  Psychiatric/Behavioral: Negative for suicidal ideas, confusion, sleep disturbance, self-injury, dysphoric mood, decreased concentration and agitation.        Objective:     Vision by Snellen chart: opth Blood pressure 158/84, pulse 64, temperature 98.3 F (36.8 C), temperature source Oral, height 5\' 10"  (1.778 m), weight 293 lb 6.4 oz (133.085 kg), SpO2 97 %. Body mass index is 42.1 kg/(m^2).  BP 158/84 mmHg  Pulse 64  Temp(Src) 98.3 F (36.8 C) (Oral)  Ht 5\' 10"  (1.778 m)  Wt 293 lb 6.4 oz (133.085 kg)  BMI 42.10 kg/m2  SpO2 97% General appearance: alert, cooperative, appears stated age and no distress Head: Normocephalic, without obvious abnormality, atraumatic Eyes: negative findings: lids and lashes normal, conjunctivae and sclerae normal and pupils equal, round, reactive to light and accomodation Ears: normal TM's and external ear canals both  ears Nose: Nares normal. Septum midline. Mucosa normal. No drainage or sinus tenderness. Throat: lips, mucosa, and tongue normal; teeth and gums normal Neck: no adenopathy, no carotid bruit, no JVD, supple, symmetrical, trachea midline and thyroid not enlarged, symmetric, no tenderness/mass/nodules Back: symmetric, no curvature. ROM normal. No CVA tenderness. Lungs: clear to auscultation bilaterally Chest wall: no tenderness Heart: regular rate and rhythm, S1, S2 normal, no murmur, click, rub or gallop Abdomen: soft, non-tender; bowel sounds normal; no masses,  no organomegaly Male genitalia: normal, penis: no lesions or discharge. testes: no  masses or tenderness. no hernias Rectal: normal tone, normal prostate, no masses or tenderness and soft brown guaiac negative stool noted Extremities: extremities normal, atraumatic, no cyanosis or edema--- pain in R shoulder dec rom  Pulses: 2+ and symmetric Skin: Skin color, texture, turgor normal. No rashes or lesions Lymph nodes: Cervical, supraclavicular, and axillary nodes normal. Neurologic: Alert and oriented X 3, normal strength and tone. Normal symmetric reflexes. Normal coordination and gait Psych--no depression, no anxity      Assessment:     cpe      Plan:     During the course of the visit the patient was educated and counseled about appropriate screening and preventive services including:    Pneumococcal vaccine   Influenza vaccine  Prostate cancer screening  Colorectal cancer screening  Diabetes screening  Advanced directives: has an advanced directive - a copy HAS NOT been provided.  Diet review for nutrition referral? Yes ____  Not Indicated __x__   Patient Instructions (the written plan) was given to the patient.  Medicare Attestation I have personally reviewed: The patient's medical and social history Their use of alcohol, tobacco or illicit drugs Their current medications and supplements The patient's  functional ability including ADLs,fall risks, home safety risks, cognitive, and hearing and visual impairment Diet and physical activities Evidence for depression or mood disorders  The patient's weight, height, BMI, and visual acuity have been recorded in the chart.  I have made referrals, counseling, and provided education to the patient based on review of the above and I have provided the patient with a written personalized care plan for preventive services.     1. Hyperlipidemia Check labs, con't meds - Basic metabolic panel - CBC with Differential - Hepatic function panel - Lipid panel  2. Urinary frequency   - POCT urinalysis dipstick - PSA  3. Nausea without vomiting   - trimethobenzamide (TIGAN) 300 MG capsule; Take 1 capsule (300 mg total) by mouth as needed.  Dispense: 20 capsule; Refill: 0  4. Essential hypertension Start meds-- recheck 2-3 weeks - lisinopril (PRINIVIL,ZESTRIL) 10 MG tablet; Take 1 tablet (10 mg total) by mouth daily.  Dispense: 90 tablet; Refill: 3  5. Other hemorrhoids  - fluocinonide ointment (LIDEX) 0.05 %; Apply 1 application topically as needed.  Dispense: 30 g; Refill: 1  6. Tinea cruris  - fluconazole (DIFLUCAN) 150 MG tablet; 1 po qd , may repeat in 3 days prn  Dispense: 2 tablet; Refill: 2  7. Pain in joint, shoulder region, right  - Ambulatory referral to Sports Medicine  8. Medicare annual wellness visit, subsequent  9. Need for vaccination with 13-polyvalent pneumococcal conjugate vaccine  - Pneumococcal conjugate vaccine 13-valent IM   Garnet Koyanagi, DO   02/11/2014

## 2014-02-11 NOTE — Patient Instructions (Signed)
Preventive Care for Adults A healthy lifestyle and preventive care can promote health and wellness. Preventive health guidelines for men include the following key practices:  A routine yearly physical is a good way to check with your health care provider about your health and preventative screening. It is a chance to share any concerns and updates on your health and to receive a thorough exam.  Visit your dentist for a routine exam and preventative care every 6 months. Brush your teeth twice a day and floss once a day. Good oral hygiene prevents tooth decay and gum disease.  The frequency of eye exams is based on your age, health, family medical history, use of contact lenses, and other factors. Follow your health care provider's recommendations for frequency of eye exams.  Eat a healthy diet. Foods such as vegetables, fruits, whole grains, low-fat dairy products, and lean protein foods contain the nutrients you need without too many calories. Decrease your intake of foods high in solid fats, added sugars, and salt. Eat the right amount of calories for you.Get information about a proper diet from your health care provider, if necessary.  Regular physical exercise is one of the most important things you can do for your health. Most adults should get at least 150 minutes of moderate-intensity exercise (any activity that increases your heart rate and causes you to sweat) each week. In addition, most adults need muscle-strengthening exercises on 2 or more days a week.  Maintain a healthy weight. The body mass index (BMI) is a screening tool to identify possible weight problems. It provides an estimate of body fat based on height and weight. Your health care provider can find your BMI and can help you achieve or maintain a healthy weight.For adults 20 years and older:  A BMI below 18.5 is considered underweight.  A BMI of 18.5 to 24.9 is normal.  A BMI of 25 to 29.9 is considered overweight.  A BMI  of 30 and above is considered obese.  Maintain normal blood lipids and cholesterol levels by exercising and minimizing your intake of saturated fat. Eat a balanced diet with plenty of fruit and vegetables. Blood tests for lipids and cholesterol should begin at age 50 and be repeated every 5 years. If your lipid or cholesterol levels are high, you are over 50, or you are at high risk for heart disease, you may need your cholesterol levels checked more frequently.Ongoing high lipid and cholesterol levels should be treated with medicines if diet and exercise are not working.  If you smoke, find out from your health care provider how to quit. If you do not use tobacco, do not start.  Lung cancer screening is recommended for adults aged 73-80 years who are at high risk for developing lung cancer because of a history of smoking. A yearly low-dose CT scan of the lungs is recommended for people who have at least a 30-pack-year history of smoking and are a current smoker or have quit within the past 15 years. A pack year of smoking is smoking an average of 1 pack of cigarettes a day for 1 year (for example: 1 pack a day for 30 years or 2 packs a day for 15 years). Yearly screening should continue until the smoker has stopped smoking for at least 15 years. Yearly screening should be stopped for people who develop a health problem that would prevent them from having lung cancer treatment.  If you choose to drink alcohol, do not have more than  2 drinks per day. One drink is considered to be 12 ounces (355 mL) of beer, 5 ounces (148 mL) of wine, or 1.5 ounces (44 mL) of liquor.  Avoid use of street drugs. Do not share needles with anyone. Ask for help if you need support or instructions about stopping the use of drugs.  High blood pressure causes heart disease and increases the risk of stroke. Your blood pressure should be checked at least every 1-2 years. Ongoing high blood pressure should be treated with  medicines, if weight loss and exercise are not effective.  If you are 45-79 years old, ask your health care provider if you should take aspirin to prevent heart disease.  Diabetes screening involves taking a blood sample to check your fasting blood sugar level. This should be done once every 3 years, after age 45, if you are within normal weight and without risk factors for diabetes. Testing should be considered at a younger age or be carried out more frequently if you are overweight and have at least 1 risk factor for diabetes.  Colorectal cancer can be detected and often prevented. Most routine colorectal cancer screening begins at the age of 50 and continues through age 75. However, your health care provider may recommend screening at an earlier age if you have risk factors for colon cancer. On a yearly basis, your health care provider may provide home test kits to check for hidden blood in the stool. Use of a small camera at the end of a tube to directly examine the colon (sigmoidoscopy or colonoscopy) can detect the earliest forms of colorectal cancer. Talk to your health care provider about this at age 50, when routine screening begins. Direct exam of the colon should be repeated every 5-10 years through age 75, unless early forms of precancerous polyps or small growths are found.  People who are at an increased risk for hepatitis B should be screened for this virus. You are considered at high risk for hepatitis B if:  You were born in a country where hepatitis B occurs often. Talk with your health care provider about which countries are considered high risk.  Your parents were born in a high-risk country and you have not received a shot to protect against hepatitis B (hepatitis B vaccine).  You have HIV or AIDS.  You use needles to inject street drugs.  You live with, or have sex with, someone who has hepatitis B.  You are a man who has sex with other men (MSM).  You get hemodialysis  treatment.  You take certain medicines for conditions such as cancer, organ transplantation, and autoimmune conditions.  Hepatitis C blood testing is recommended for all people born from 1945 through 1965 and any individual with known risks for hepatitis C.  Practice safe sex. Use condoms and avoid high-risk sexual practices to reduce the spread of sexually transmitted infections (STIs). STIs include gonorrhea, chlamydia, syphilis, trichomonas, herpes, HPV, and human immunodeficiency virus (HIV). Herpes, HIV, and HPV are viral illnesses that have no cure. They can result in disability, cancer, and death.  If you are at risk of being infected with HIV, it is recommended that you take a prescription medicine daily to prevent HIV infection. This is called preexposure prophylaxis (PrEP). You are considered at risk if:  You are a man who has sex with other men (MSM) and have other risk factors.  You are a heterosexual man, are sexually active, and are at increased risk for HIV infection.    You take drugs by injection.  You are sexually active with a partner who has HIV.  Talk with your health care provider about whether you are at high risk of being infected with HIV. If you choose to begin PrEP, you should first be tested for HIV. You should then be tested every 3 months for as long as you are taking PrEP.  A one-time screening for abdominal aortic aneurysm (AAA) and surgical repair of large AAAs by ultrasound are recommended for men ages 32 to 67 years who are current or former smokers.  Healthy men should no longer receive prostate-specific antigen (PSA) blood tests as part of routine cancer screening. Talk with your health care provider about prostate cancer screening.  Testicular cancer screening is not recommended for adult males who have no symptoms. Screening includes self-exam, a health care provider exam, and other screening tests. Consult with your health care provider about any symptoms  you have or any concerns you have about testicular cancer.  Use sunscreen. Apply sunscreen liberally and repeatedly throughout the day. You should seek shade when your shadow is shorter than you. Protect yourself by wearing long sleeves, pants, a wide-brimmed hat, and sunglasses year round, whenever you are outdoors.  Once a month, do a whole-body skin exam, using a mirror to look at the skin on your back. Tell your health care provider about new moles, moles that have irregular borders, moles that are larger than a pencil eraser, or moles that have changed in shape or color.  Stay current with required vaccines (immunizations).  Influenza vaccine. All adults should be immunized every year.  Tetanus, diphtheria, and acellular pertussis (Td, Tdap) vaccine. An adult who has not previously received Tdap or who does not know his vaccine status should receive 1 dose of Tdap. This initial dose should be followed by tetanus and diphtheria toxoids (Td) booster doses every 10 years. Adults with an unknown or incomplete history of completing a 3-dose immunization series with Td-containing vaccines should begin or complete a primary immunization series including a Tdap dose. Adults should receive a Td booster every 10 years.  Varicella vaccine. An adult without evidence of immunity to varicella should receive 2 doses or a second dose if he has previously received 1 dose.  Human papillomavirus (HPV) vaccine. Males aged 68-21 years who have not received the vaccine previously should receive the 3-dose series. Males aged 22-26 years may be immunized. Immunization is recommended through the age of 6 years for any male who has sex with males and did not get any or all doses earlier. Immunization is recommended for any person with an immunocompromised condition through the age of 49 years if he did not get any or all doses earlier. During the 3-dose series, the second dose should be obtained 4-8 weeks after the first  dose. The third dose should be obtained 24 weeks after the first dose and 16 weeks after the second dose.  Zoster vaccine. One dose is recommended for adults aged 50 years or older unless certain conditions are present.  Measles, mumps, and rubella (MMR) vaccine. Adults born before [birth year] generally are considered immune to measles and mumps. Adults born in [birth year] or later should have 1 or more doses of MMR vaccine unless there is a contraindication to the vaccine or there is laboratory evidence of immunity to each of the three diseases. A routine second dose of MMR vaccine should be obtained at least 28 days after the first dose for students attending postsecondary  schools, health care workers, or international travelers. People who received inactivated measles vaccine or an unknown type of measles vaccine during 1963-1967 should receive 2 doses of MMR vaccine. People who received inactivated mumps vaccine or an unknown type of mumps vaccine before 1979 and are at high risk for mumps infection should consider immunization with 2 doses of MMR vaccine. Unvaccinated health care workers born before 1957 who lack laboratory evidence of measles, mumps, or rubella immunity or laboratory confirmation of disease should consider measles and mumps immunization with 2 doses of MMR vaccine or rubella immunization with 1 dose of MMR vaccine.  Pneumococcal 13-valent conjugate (PCV13) vaccine. When indicated, a person who is uncertain of his immunization history and has no record of immunization should receive the PCV13 vaccine. An adult aged 19 years or older who has certain medical conditions and has not been previously immunized should receive 1 dose of PCV13 vaccine. This PCV13 should be followed with a dose of pneumococcal polysaccharide (PPSV23) vaccine. The PPSV23 vaccine dose should be obtained at least 8 weeks after the dose of PCV13 vaccine. An adult aged 19 years or older who has certain medical conditions and  previously received 1 or more doses of PPSV23 vaccine should receive 1 dose of PCV13. The PCV13 vaccine dose should be obtained 1 or more years after the last PPSV23 vaccine dose.  Pneumococcal polysaccharide (PPSV23) vaccine. When PCV13 is also indicated, PCV13 should be obtained first. All adults aged 65 years and older should be immunized. An adult younger than age 65 years who has certain medical conditions should be immunized. Any person who resides in a nursing home or long-term care facility should be immunized. An adult smoker should be immunized. People with an immunocompromised condition and certain other conditions should receive both PCV13 and PPSV23 vaccines. People with human immunodeficiency virus (HIV) infection should be immunized as soon as possible after diagnosis. Immunization during chemotherapy or radiation therapy should be avoided. Routine use of PPSV23 vaccine is not recommended for American Indians, [place] Natives, or people younger than 65 years unless there are medical conditions that require PPSV23 vaccine. When indicated, people who have unknown immunization and have no record of immunization should receive PPSV23 vaccine. One-time revaccination 5 years after the first dose of PPSV23 is recommended for people aged 19-64 years who have chronic kidney failure, nephrotic syndrome, asplenia, or immunocompromised conditions. People who received 1-2 doses of PPSV23 before age 65 years should receive another dose of PPSV23 vaccine at age 65 years or later if at least 5 years have passed since the previous dose. Doses of PPSV23 are not needed for people immunized with PPSV23 at or after age 65 years.  Meningococcal vaccine. Adults with asplenia or persistent complement component deficiencies should receive 2 doses of quadrivalent meningococcal conjugate (MenACWY-D) vaccine. The doses should be obtained at least 2 months apart. Microbiologists working with certain meningococcal bacteria,  military recruits, people at risk during an outbreak, and people who travel to or live in countries with a high rate of meningitis should be immunized. A first-year college student up through age 21 years who is living in a residence hall should receive a dose if he did not receive a dose on or after his 16th birthday. Adults who have certain high-risk conditions should receive one or more doses of vaccine.  Hepatitis A vaccine. Adults who wish to be protected from this disease, have certain high-risk conditions, work with hepatitis A-infected animals, work in hepatitis A research labs, or   travel to or work in countries with a high rate of hepatitis A should be immunized. Adults who were previously unvaccinated and who anticipate close contact with an international adoptee during the first 60 days after arrival in the [place] from a country with a high rate of hepatitis A should be immunized.  Hepatitis B vaccine. Adults should be immunized if they wish to be protected from this disease, have certain high-risk conditions, may be exposed to blood or other infectious body fluids, are household contacts or sex partners of hepatitis B positive people, are clients or workers in certain care facilities, or travel to or work in countries with a high rate of hepatitis B.  Haemophilus influenzae type b (Hib) vaccine. A previously unvaccinated person with asplenia or sickle cell disease or having a scheduled splenectomy should receive 1 dose of Hib vaccine. Regardless of previous immunization, a recipient of a hematopoietic stem cell transplant should receive a 3-dose series 6-12 months after his successful transplant. Hib vaccine is not recommended for adults with HIV infection. Preventive Service / Frequency Ages 52 to 17  Blood pressure check.** / Every 1 to 2 years.  Lipid and cholesterol check.** / Every 5 years beginning at age 69.  Hepatitis C blood test.** / For any individual with known risks for  hepatitis C.  Skin self-exam. / Monthly.  Influenza vaccine. / Every year.  Tetanus, diphtheria, and acellular pertussis (Tdap, Td) vaccine.** / Consult your health care provider. 1 dose of Td every 10 years.  Varicella vaccine.** / Consult your health care provider.  HPV vaccine. / 3 doses over 6 months, if 72 or younger.  Measles, mumps, rubella (MMR) vaccine.** / You need at least 1 dose of MMR if you were born in 1957 or later. You may also need a second dose.  Pneumococcal 13-valent conjugate (PCV13) vaccine.** / Consult your health care provider.  Pneumococcal polysaccharide (PPSV23) vaccine.** / 1 to 2 doses if you smoke cigarettes or if you have certain conditions.  Meningococcal vaccine.** / 1 dose if you are age 35 to 60 years and a Market researcher living in a residence hall, or have one of several medical conditions. You may also need additional booster doses.  Hepatitis A vaccine.** / Consult your health care provider.  Hepatitis B vaccine.** / Consult your health care provider.  Haemophilus influenzae type b (Hib) vaccine.** / Consult your health care provider. Ages 35 to 8  Blood pressure check.** / Every 1 to 2 years.  Lipid and cholesterol check.** / Every 5 years beginning at age 57.  Lung cancer screening. / Every year if you are aged 44-80 years and have a 30-pack-year history of smoking and currently smoke or have quit within the past 15 years. Yearly screening is stopped once you have quit smoking for at least 15 years or develop a health problem that would prevent you from having lung cancer treatment.  Fecal occult blood test (FOBT) of stool. / Every year beginning at age 55 and continuing until age 73. You may not have to do this test if you get a colonoscopy every 10 years.  Flexible sigmoidoscopy** or colonoscopy.** / Every 5 years for a flexible sigmoidoscopy or every 10 years for a colonoscopy beginning at age 28 and continuing until age  1.  Hepatitis C blood test.** / For all people born from [birth year] through 1965 and any individual with known risks for hepatitis C.  Skin self-exam. / Monthly.  Influenza vaccine. / Every  year.  Tetanus, diphtheria, and acellular pertussis (Tdap/Td) vaccine.** / Consult your health care provider. 1 dose of Td every 10 years.  Varicella vaccine.** / Consult your health care provider.  Zoster vaccine.** / 1 dose for adults aged 53 years or older.  Measles, mumps, rubella (MMR) vaccine.** / You need at least 1 dose of MMR if you were born in 1957 or later. You may also need a second dose.  Pneumococcal 13-valent conjugate (PCV13) vaccine.** / Consult your health care provider.  Pneumococcal polysaccharide (PPSV23) vaccine.** / 1 to 2 doses if you smoke cigarettes or if you have certain conditions.  Meningococcal vaccine.** / Consult your health care provider.  Hepatitis A vaccine.** / Consult your health care provider.  Hepatitis B vaccine.** / Consult your health care provider.  Haemophilus influenzae type b (Hib) vaccine.** / Consult your health care provider. Ages 77 and over  Blood pressure check.** / Every 1 to 2 years.  Lipid and cholesterol check.**/ Every 5 years beginning at age 85.  Lung cancer screening. / Every year if you are aged 55-80 years and have a 30-pack-year history of smoking and currently smoke or have quit within the past 15 years. Yearly screening is stopped once you have quit smoking for at least 15 years or develop a health problem that would prevent you from having lung cancer treatment.  Fecal occult blood test (FOBT) of stool. / Every year beginning at age 33 and continuing until age 11. You may not have to do this test if you get a colonoscopy every 10 years.  Flexible sigmoidoscopy** or colonoscopy.** / Every 5 years for a flexible sigmoidoscopy or every 10 years for a colonoscopy beginning at age 28 and continuing until age 73.  Hepatitis C blood  test.** / For all people born from [birth year] through 1965 and any individual with known risks for hepatitis C.  Abdominal aortic aneurysm (AAA) screening.** / A one-time screening for ages 50 to 27 years who are current or former smokers.  Skin self-exam. / Monthly.  Influenza vaccine. / Every year.  Tetanus, diphtheria, and acellular pertussis (Tdap/Td) vaccine.** / 1 dose of Td every 10 years.  Varicella vaccine.** / Consult your health care provider.  Zoster vaccine.** / 1 dose for adults aged 34 years or older.  Pneumococcal 13-valent conjugate (PCV13) vaccine.** / Consult your health care provider.  Pneumococcal polysaccharide (PPSV23) vaccine.** / 1 dose for all adults aged 63 years and older.  Meningococcal vaccine.** / Consult your health care provider.  Hepatitis A vaccine.** / Consult your health care provider.  Hepatitis B vaccine.** / Consult your health care provider.  Haemophilus influenzae type b (Hib) vaccine.** / Consult your health care provider. **Family history and personal history of risk and conditions may change your health care provider's recommendations. Document Released: 05/04/2001 Document Revised: 03/13/2013 Document Reviewed: 08/03/2010 [facility] Patient Information 2015 [place], [place]. This information is not intended to replace advice given to you by your health care provider. Make sure you discuss any questions you have with your health care provider.

## 2014-02-19 ENCOUNTER — Ambulatory Visit (INDEPENDENT_AMBULATORY_CARE_PROVIDER_SITE_OTHER): Payer: Medicare Other | Admitting: Family Medicine

## 2014-02-19 ENCOUNTER — Encounter: Payer: Self-pay | Admitting: Family Medicine

## 2014-02-19 VITALS — BP 160/79 | HR 56 | Ht 70.0 in | Wt 295.0 lb

## 2014-02-19 DIAGNOSIS — M25512 Pain in left shoulder: Secondary | ICD-10-CM

## 2014-02-19 MED ORDER — METHYLPREDNISOLONE ACETATE 40 MG/ML IJ SUSP
40.0000 mg | Freq: Once | INTRAMUSCULAR | Status: AC
  Administered 2014-02-19: 40 mg via INTRA_ARTICULAR

## 2014-02-19 NOTE — Patient Instructions (Signed)
You have a frozen shoulder (adhesive capsulitis), a buildup of scar tissue that limits motion of the shoulder joint, likely underlying arthritis as well. Limit lifting and overhead activities as much as possible outside your usual range of motion exercises. Heat 15 minutes at a time 3-4 times a day may help with movement and stiffness. Tylenol and/or aleve as needed for pain and inflammation. Steroid injections in a series have been shown to help with pain and motion - can do 3 shots separated by 4-6 weeks. Codman exercises (pendulum, wall walking or table slides, arm circles) - do 3 sets of 10 daily of each of these (at most twice a day). Physical therapy for rotator cuff strengthening is a consideration once you are out of the painful phase Follow up in 6 weeks.

## 2014-02-20 DIAGNOSIS — M25512 Pain in left shoulder: Secondary | ICD-10-CM | POA: Insufficient documentation

## 2014-02-20 NOTE — Progress Notes (Signed)
PCP: Garnet Koyanagi, DO  Subjective:   HPI: Patient is a 68 y.o. male here for shoulder pain.  Patient reports he started to get left shoulder pain about 1 year ago. No new injuries or trauma he is aware of. In 1979 he recalls slipping on a mountain slope and subluxing this shoulder. Resolved then returned a year ago. Pain gets to 4/10 with activities. Pain superior and anterior. Popping with overhead motions also. Motion limited. Also has problems more with motion limitation and strength in right shoulder - is s/p rotator cuff surgery (?repair) and biceps tenodesis from his description 5 years ago - never completely recovered.  Past Medical History  Diagnosis Date  . Internal hemorrhoids without mention of complication   . Osteoarthrosis, unspecified whether generalized or localized, unspecified site   . GERD (gastroesophageal reflux disease)   . Hyperlipemia   . Colon polyps   . Diverticulosis of colon   . Sleep apnea     CPAP  . Hx of cardiovascular stress test     Lexiscan Myoview (04/2013):  No ischemia, EF 61%; low risk    Current Outpatient Prescriptions on File Prior to Visit  Medication Sig Dispense Refill  . aspirin 81 MG EC tablet Take 81 mg by mouth daily.      [name] atorvastatin (LIPITOR) 40 MG tablet TAKE 1 TABLET DAILY 90 tablet 1  . Coenzyme Q-10 200 MG CAPS Take 1 each by mouth daily.    . fluconazole (DIFLUCAN) 150 MG tablet 1 po qd , may repeat in 3 days prn 2 tablet 2  . fluocinonide ointment (LIDEX) 2.84 % Apply 1 application topically as needed. 30 g 1  . lisinopril (PRINIVIL,ZESTRIL) 10 MG tablet Take 1 tablet (10 mg total) by mouth daily. 90 tablet 3  . meloxicam (MOBIC) 15 MG tablet TAKE 1 TABLET DAILY 90 tablet 1  . Multiple Vitamin (MULTIVITAMIN PO)      . mupirocin cream (BACTROBAN) 2 % Apply topically as needed. 15 g 1  . NEXIUM 40 MG capsule TAKE 1 CAPSULE DAILY 90 capsule 1  . trimethobenzamide (TIGAN) 300 MG capsule Take 1 capsule (300 mg total) by  mouth as needed. 20 capsule 0   No current facility-administered medications on file prior to visit.    Past Surgical History  Procedure Laterality Date  . Shoulder surgery  2009    right  . Tonsillectomy      No Known Allergies  History   Social History  . Marital Status: Married    Spouse Name: N/A    Number of Children: 3  . Years of Education: N/A   Occupational History  . Retired--[employer], [employer]    Social History Main Topics  . Smoking status: Never Smoker   . Smokeless tobacco: Never Used  . Alcohol Use: 0.0 oz/week    0 Not specified per week     Comment: rare----12 a year  . Drug Use: No  . Sexual Activity:    Partners: Female   Other Topics Concern  . Not on file   Social History Narrative   Lives with wife and daughter and two grandchildren.     Family History  Problem Relation Age of Onset  . Breast cancer    . Prostate cancer    . Arthritis Mother   . Cancer Mother 103    breast  . Hyperlipidemia Mother   . Colon polyps Mother 81  . Heart disease Father     CAD--passed away  2013 age 31  . Hyperlipidemia Father   . Hypertension Father   . Cancer Sister 3    breast  . Cancer Brother 34    prostate  . Colon cancer Neg Hx     BP 160/79 mmHg  Pulse 56  Ht 5\' 10"  (1.778 m)  Wt 295 lb (133.811 kg)  BMI 42.33 kg/m2  Review of Systems: See HPI above.    Objective:  Physical Exam:  Gen: NAD  Left shoulder: No swelling, ecchymoses.  No gross deformity. Mild TTP supraspinatus, anterior shoulder.  No AC or other tenderness. ROM limited to 40 degrees ER, 100 degrees abduction and flexion. Mild pain with Wynonia Musty. Mild positive yergasons. Strength 5/5 with empty can and resisted internal/external rotation. Negative apprehension but limited motion. NV intact distally.  Right shoulder: No swelling, ecchymoses. No TTP. ROM limited to 40 degrees ER, 90 abduction and flexion but not painful. Negative [name],  Neers. Negative Speeds, Yergasons. Strength 4/5 with empty can and resisted internal/external rotation.  Minimal pain. Negative apprehension. NV intact distally.    Assessment & Plan:  1. Left shoulder pain - patient reports he had full motion a year ago and this has progressively worsened.  Suspect adhesive capsulitis with some underlying DJD.  Shown codman exercises to do daily.  Intraarticular injection given.  Consider PT in the future.  Heat to help with motion.  F/u in 6 weeks.  After informed written consent, patient was seated on exam table. Left shoulder was prepped with alcohol swab and utilizing posterior approach, patient's left glenohumeral space was injected with 3:1 marcaine: depomedrol. Patient tolerated the procedure well without immediate complications.

## 2014-02-20 NOTE — Assessment & Plan Note (Signed)
patient reports he had full motion a year ago and this has progressively worsened.  Suspect adhesive capsulitis with some underlying DJD.  Shown codman exercises to do daily.  Intraarticular injection given.  Consider PT in the future.  Heat to help with motion.  F/u in 6 weeks.  After informed written consent, patient was seated on exam table. Left shoulder was prepped with alcohol swab and utilizing posterior approach, patient's left glenohumeral space was injected with 3:1 marcaine: depomedrol. Patient tolerated the procedure well without immediate complications.

## 2014-02-25 ENCOUNTER — Encounter: Payer: Self-pay | Admitting: *Deleted

## 2014-03-08 ENCOUNTER — Ambulatory Visit (INDEPENDENT_AMBULATORY_CARE_PROVIDER_SITE_OTHER): Payer: Medicare Other | Admitting: Family Medicine

## 2014-03-08 ENCOUNTER — Encounter: Payer: Self-pay | Admitting: Family Medicine

## 2014-03-08 VITALS — BP 126/74 | HR 53 | Temp 98.2°F | Wt 294.4 lb

## 2014-03-08 DIAGNOSIS — B356 Tinea cruris: Secondary | ICD-10-CM | POA: Diagnosis not present

## 2014-03-08 DIAGNOSIS — I10 Essential (primary) hypertension: Secondary | ICD-10-CM

## 2014-03-08 MED ORDER — LISINOPRIL 10 MG PO TABS: 10.0000 mg | ORAL_TABLET | Freq: Every day | ORAL | Status: DC

## 2014-03-08 MED ORDER — NAFTIFINE HCL 1 % EX CREA: TOPICAL_CREAM | Freq: Every day | CUTANEOUS | Status: DC

## 2014-03-08 NOTE — Progress Notes (Signed)
   Subjective:    Patient here for follow-up of elevated blood pressure.  He is exercising and is adherent to a low-salt diet.  Blood pressure is well controlled at home. Cardiac symptoms: none. Patient denies: chest pain, chest pressure/discomfort, claudication, dyspnea, exertional chest pressure/discomfort, fatigue, irregular heart beat, lower extremity edema, near-syncope, orthopnea, palpitations, paroxysmal nocturnal dyspnea, syncope and tachypnea. Cardiovascular risk factors: advanced age (older than 19 for men, 50 for women), hypertension, male gender, obesity (BMI >= 30 kg/m2) and sedentary lifestyle. Use of agents associated with hypertension: none. History of target organ damage: none. Pt also c/o tinea rash in groin-- it improved with diflucan but came right back.    The following portions of the patient's history were reviewed and updated as appropriate: allergies, current medications, past family history, past medical history, past social history, past surgical history and problem list.  Review of Systems Pertinent items are noted in HPI.     Objective:    BP 126/74 mmHg  Pulse 53  Temp(Src) 98.2 F (36.8 C) (Oral)  Wt 294 lb 6.4 oz (133.539 kg)  SpO2 97% General appearance: alert, cooperative, appears stated age and no distress Throat: lips, mucosa, and tongue normal; teeth and gums normal Neck: no adenopathy, no carotid bruit, no JVD, supple, symmetrical, trachea midline and thyroid not enlarged, symmetric, no tenderness/mass/nodules Lungs: clear to auscultation bilaterally Heart: S1, S2 normal Extremities: extremities normal, atraumatic, no cyanosis or edema    Assessment:    Hypertension, normal blood pressure . Evidence of target organ damage: none.    Plan:    Medication: no change. Dietary sodium restriction. Regular aerobic exercise. Follow up: 6 months and as needed.    1. Tinea cruris  - naftifine (NAFTIN) 1 % cream; Apply topically daily.  Dispense: 90  g; Refill: 3  2. Essential hypertension  - lisinopril (PRINIVIL,ZESTRIL) 10 MG tablet; Take 1 tablet (10 mg total) by mouth daily.  Dispense: 90 tablet; Refill: 3

## 2014-03-08 NOTE — Progress Notes (Signed)
Pre visit review using our clinic review tool, if applicable. No additional management support is needed unless otherwise documented below in the visit note. 

## 2014-03-08 NOTE — Patient Instructions (Signed)

## 2014-04-02 ENCOUNTER — Ambulatory Visit (HOSPITAL_BASED_OUTPATIENT_CLINIC_OR_DEPARTMENT_OTHER)
Admission: RE | Admit: 2014-04-02 | Discharge: 2014-04-02 | Disposition: A | Discharge status: Home / Self Care | Payer: Medicare Other | Source: Ambulatory Visit | Attending: Family Medicine | Admitting: Family Medicine

## 2014-04-02 ENCOUNTER — Ambulatory Visit (INDEPENDENT_AMBULATORY_CARE_PROVIDER_SITE_OTHER): Payer: Medicare Other | Admitting: Family Medicine

## 2014-04-02 ENCOUNTER — Encounter: Payer: Self-pay | Admitting: Family Medicine

## 2014-04-02 VITALS — BP 149/74 | HR 63 | Ht 70.0 in | Wt 287.0 lb

## 2014-04-02 DIAGNOSIS — M79672 Pain in left foot: Secondary | ICD-10-CM

## 2014-04-02 DIAGNOSIS — G8929 Other chronic pain: Secondary | ICD-10-CM | POA: Insufficient documentation

## 2014-04-02 DIAGNOSIS — M25512 Pain in left shoulder: Secondary | ICD-10-CM | POA: Diagnosis not present

## 2014-04-02 DIAGNOSIS — M19012 Primary osteoarthritis, left shoulder: Secondary | ICD-10-CM | POA: Diagnosis not present

## 2014-04-02 NOTE — Patient Instructions (Addendum)
You have metatarsalgia of your left foot. Sports insoles with metatarsal pads are the primary treatment. You can try an injection between the metatarsal heads if pain worsens but I would generally avoid this. Icing as needed. Avoid barefoot walking, flat shoes as much as possible.  You have severe arthritis of the shoulder. Limit lifting and overhead activities as much as possible outside your usual range of motion exercises. Heat 15 minutes at a time 3-4 times a day may help with movement and stiffness. Continue your meloxicam. Consider repeat injection. Call me if you want to consult with the surgeon we were discussing. Follow up with me as needed.

## 2014-04-04 DIAGNOSIS — M79672 Pain in left foot: Secondary | ICD-10-CM | POA: Insufficient documentation

## 2014-04-04 NOTE — Assessment & Plan Note (Signed)
2/2 metatarsalgia.  Sports insoles provided today with metatarsal pad on left.  Consider injection if not improving.  Avoid barefoot walking, flat shoes.

## 2014-04-04 NOTE — Assessment & Plan Note (Signed)
Radiographs today unfortunately show advanced glenohumeral arthritis.  Doubt adhesive capsulitis is an issue with these findings.  No improvement with injection here - discussed possibly repeating this.  Continue meloxicam, heat, motion exercises.  Call me if he wants to consult with surgeon regarding possible replacement.

## 2014-04-04 NOTE — Progress Notes (Signed)
PCP: Garnet Koyanagi, DO  Subjective:   HPI: Patient is a 69 y.o. male here for shoulder pain.  02/19/14: Patient reports he started to get left shoulder pain about 1 year ago. No new injuries or trauma he is aware of. In 1979 he recalls slipping on a mountain slope and subluxing this shoulder. Resolved then returned a year ago. Pain gets to 4/10 with activities. Pain superior and anterior. Popping with overhead motions also. Motion limited. Also has problems more with motion limitation and strength in right shoulder - is s/p rotator cuff surgery (?repair) and biceps tenodesis from his description 5 years ago - never completely recovered.  04/02/14: Patient reports he's had very minimal improvement following last visit and injection. Still very limited motion. Home exercises have helped some. Pain worse when trying to reach up high. Also reporting pain in left foot around 2nd metatarsal head. Bothers mainly in his Sunday shoes.  Past Medical History  Diagnosis Date  . Internal hemorrhoids without mention of complication   . Osteoarthrosis, unspecified whether generalized or localized, unspecified site   . GERD (gastroesophageal reflux disease)   . Hyperlipemia   . Colon polyps   . Diverticulosis of colon   . Sleep apnea     CPAP  . Hx of cardiovascular stress test     Lexiscan Myoview (04/2013):  No ischemia, EF 61%; low risk    Current Outpatient Prescriptions on File Prior to Visit  Medication Sig Dispense Refill  . aspirin 81 MG EC tablet Take 81 mg by mouth daily.      [name] atorvastatin (LIPITOR) 40 MG tablet TAKE 1 TABLET DAILY 90 tablet 1  . Coenzyme Q-10 200 MG CAPS Take 1 each by mouth daily.    . fluocinonide ointment (LIDEX) 1.44 % Apply 1 application topically as needed. 30 g 1  . lisinopril (PRINIVIL,ZESTRIL) 10 MG tablet Take 1 tablet (10 mg total) by mouth daily. 90 tablet 3  . meloxicam (MOBIC) 15 MG tablet TAKE 1 TABLET DAILY 90 tablet 1  . Multiple Vitamin  (MULTIVITAMIN PO)      . mupirocin cream (BACTROBAN) 2 % Apply topically as needed. 15 g 1  . naftifine (NAFTIN) 1 % cream Apply topically daily. 90 g 3  . NEXIUM 40 MG capsule TAKE 1 CAPSULE DAILY 90 capsule 1  . trimethobenzamide (TIGAN) 300 MG capsule Take 1 capsule (300 mg total) by mouth as needed. 20 capsule 0   No current facility-administered medications on file prior to visit.    Past Surgical History  Procedure Laterality Date  . Shoulder surgery  2009    right  . Tonsillectomy      No Known Allergies  History   Social History  . Marital Status: Married    Spouse Name: N/A    Number of Children: 3  . Years of Education: N/A   Occupational History  . Retired--[employer], [employer]    Social History Main Topics  . Smoking status: Never Smoker   . Smokeless tobacco: Never Used  . Alcohol Use: 0.0 oz/week    0 Not specified per week     Comment: rare----12 a year  . Drug Use: No  . Sexual Activity:    Partners: Female   Other Topics Concern  . Not on file   Social History Narrative   Lives with wife and daughter and two grandchildren.     Family History  Problem Relation Age of Onset  . Breast cancer    .  Prostate cancer    . Arthritis Mother   . Cancer Mother 62    breast  . Hyperlipidemia Mother   . Colon polyps Mother 42  . Heart disease Father     CAD--passed away 07-20-2011 age 24  . Hyperlipidemia Father   . Hypertension Father   . Cancer Sister 34    breast  . Cancer Brother 24    prostate  . Colon cancer Neg Hx     BP 149/74 mmHg  Pulse 63  Ht 5\' 10"  (1.778 m)  Wt 287 lb (130.182 kg)  BMI 41.18 kg/m2  Review of Systems: See HPI above.    Objective:  Physical Exam:  Gen: NAD  Left shoulder: No swelling, ecchymoses.  No gross deformity. Mild TTP supraspinatus, anterior shoulder.  No AC or other tenderness. ROM limited to 40 degrees ER, 100 degrees abduction and flexion. Mild pain with Wynonia Musty. Mild positive  yergasons. Strength 5/5 with empty can and resisted internal/external rotation. Negative apprehension but limited motion. NV intact distally.  Right shoulder: No swelling, ecchymoses. No TTP. ROM limited to 40 degrees ER, 90 abduction and flexion but not painful. Negative [name], Neers. Negative Speeds, Yergasons. Strength 4/5 with empty can and resisted internal/external rotation.  Minimal pain. Negative apprehension. NV intact distally.  Left foot: Collapse of transverse arch but no callus. Pes planus. Mild tenderness plantar 2nd MT head.  No other tenderness of foot.    Assessment & Plan:  1. Left shoulder pain - Radiographs today unfortunately show advanced glenohumeral arthritis.  Doubt adhesive capsulitis is an issue with these findings.  No improvement with injection here - discussed possibly repeating this.  Continue meloxicam, heat, motion exercises.  Call me if he wants to consult with surgeon regarding possible replacement.    2. Left foot pain - 2/2 metatarsalgia.  Sports insoles provided today with metatarsal pad on left.  Consider injection if not improving.  Avoid barefoot walking, flat shoes.

## 2014-04-27 ENCOUNTER — Other Ambulatory Visit: Payer: Self-pay | Admitting: Family Medicine

## 2014-05-20 ENCOUNTER — Encounter: Payer: Self-pay | Admitting: Family Medicine

## 2014-05-20 ENCOUNTER — Other Ambulatory Visit: Payer: Self-pay

## 2014-05-20 MED ORDER — VALSARTAN 80 MG PO TABS: 80.0000 mg | ORAL_TABLET | Freq: Every day | ORAL | Status: DC

## 2014-05-20 NOTE — Telephone Encounter (Signed)
D/c lisinopril and start diovan 80 mg #30  1 po qd , 1 refills Ov 2-3 weeks

## 2014-05-20 NOTE — Telephone Encounter (Signed)
D/c lisinopril Start diovan 80 #30 1 po qd , 2 refills Ov 2-3 weeks

## 2014-06-07 ENCOUNTER — Ambulatory Visit (INDEPENDENT_AMBULATORY_CARE_PROVIDER_SITE_OTHER): Payer: Medicare Other | Admitting: Family Medicine

## 2014-06-07 ENCOUNTER — Encounter: Payer: Self-pay | Admitting: Family Medicine

## 2014-06-07 VITALS — BP 116/62 | HR 52 | Temp 98.2°F | Wt 287.0 lb

## 2014-06-07 DIAGNOSIS — E785 Hyperlipidemia, unspecified: Secondary | ICD-10-CM

## 2014-06-07 DIAGNOSIS — I10 Essential (primary) hypertension: Secondary | ICD-10-CM

## 2014-06-07 LAB — BASIC METABOLIC PANEL
BUN: 19 mg/dL (ref 6–23)
CO2: 30 mEq/L (ref 19–32)
Calcium: 8.9 mg/dL (ref 8.4–10.5)
Chloride: 105 mEq/L (ref 96–112)
Creatinine, Ser: 1.06 mg/dL (ref 0.40–1.50)
GFR: 73.82 mL/min (ref >60.00)
Glucose, Bld: 101 mg/dL — ABNORMAL HIGH (ref 70–99)
Potassium: 4.1 mEq/L (ref 3.5–5.1)
Sodium: 138 mEq/L (ref 135–145)

## 2014-06-07 LAB — HEPATIC FUNCTION PANEL
ALT: 28 U/L (ref 0–53)
AST: 24 U/L (ref 0–37)
Albumin: 4 g/dL (ref 3.5–5.2)
Alkaline Phosphatase: 55 U/L (ref 39–117)
Bilirubin, Direct: 0.2 mg/dL (ref 0.0–0.3)
Total Bilirubin: 0.7 mg/dL (ref 0.2–1.2)
Total Protein: 6.5 g/dL (ref 6.0–8.3)

## 2014-06-07 LAB — LIPID PANEL
Cholesterol: 125 mg/dL (ref 0–200)
HDL: 37.4 mg/dL — ABNORMAL LOW (ref >39.00)
LDL Cholesterol: 70 mg/dL (ref 0–99)
NonHDL: 87.6
Total CHOL/HDL Ratio: 3
Triglycerides: 89 mg/dL (ref 0.0–149.0)
VLDL: 17.8 mg/dL (ref 0.0–40.0)

## 2014-06-07 MED ORDER — VALSARTAN 80 MG PO TABS: 80.0000 mg | ORAL_TABLET | Freq: Every day | ORAL | Status: DC

## 2014-06-07 NOTE — Patient Instructions (Signed)

## 2014-06-07 NOTE — Progress Notes (Signed)
Pre visit review using our clinic review tool, if applicable. No additional management support is needed unless otherwise documented below in the visit note. 

## 2014-06-07 NOTE — Progress Notes (Signed)
Subjective:    Patient ID: Eric [name], male    DOB: 1946/01/06, 69 y.o.   MRN: [id number]  HPI  Patient here for f/u htn.    Past Medical History  Diagnosis Date  . Internal hemorrhoids without mention of complication   . Osteoarthrosis, unspecified whether generalized or localized, unspecified site   . GERD (gastroesophageal reflux disease)   . Hyperlipemia   . Colon polyps   . Diverticulosis of colon   . Sleep apnea     CPAP  . Hx of cardiovascular stress test     Lexiscan Myoview (04/2013):  No ischemia, EF 61%; low risk    Review of Systems  Constitutional: Negative.   HENT: Negative for congestion, ear pain, hearing loss, nosebleeds, postnasal drip, rhinorrhea, sinus pressure, sneezing and tinnitus.   Eyes: Negative for photophobia, discharge, itching and visual disturbance.  Respiratory: Negative.   Cardiovascular: Negative.   Gastrointestinal: Negative for abdominal pain, constipation, blood in stool, abdominal distention and anal bleeding.  Endocrine: Negative.   Genitourinary: Negative.   Musculoskeletal: Negative.   Skin: Negative.   Allergic/Immunologic: Negative.   Neurological: Negative for dizziness, weakness, light-headedness, numbness and headaches.  Psychiatric/Behavioral: Negative for suicidal ideas, confusion, sleep disturbance, dysphoric mood, decreased concentration and agitation. The patient is not nervous/anxious.     Current Outpatient Prescriptions on File Prior to Visit  Medication Sig Dispense Refill  . aspirin 81 MG EC tablet Take 81 mg by mouth daily.      [name] atorvastatin (LIPITOR) 40 MG tablet TAKE 1 TABLET DAILY 90 tablet 1  . Coenzyme Q-10 200 MG CAPS Take 1 each by mouth daily.    . fluocinonide ointment (LIDEX) 8.10 % Apply 1 application topically as needed. 30 g 1  . meloxicam (MOBIC) 15 MG tablet TAKE 1 TABLET DAILY 90 tablet 1  . Multiple Vitamin (MULTIVITAMIN PO)      . mupirocin cream (BACTROBAN) 2 % Apply topically as needed.  15 g 1  . naftifine (NAFTIN) 1 % cream Apply topically daily. 90 g 3  . NEXIUM 40 MG capsule TAKE 1 CAPSULE DAILY 90 capsule 3  . trimethobenzamide (TIGAN) 300 MG capsule Take 1 capsule (300 mg total) by mouth as needed. 20 capsule 0   No current facility-administered medications on file prior to visit.       Objective:    Physical Exam  Constitutional: He appears well-developed and well-nourished. No distress.  Cardiovascular: Normal rate, regular rhythm and normal heart sounds.   Pulmonary/Chest: Effort normal and breath sounds normal. No respiratory distress.  Psychiatric: He has a normal mood and affect. His behavior is normal. Judgment and thought content normal.    BP 116/62 mmHg  Pulse 52  Temp(Src) 98.2 F (36.8 C) (Oral)  Wt 287 lb (130.182 kg)  SpO2 99% Wt Readings from Last 3 Encounters:  06/07/14 287 lb (130.182 kg)  04/02/14 287 lb (130.182 kg)  03/08/14 294 lb 6.4 oz (133.539 kg)     Lab Results  Component Value Date   WBC 8.0 02/11/2014   HGB 15.4 02/11/2014   HCT 47.7 02/11/2014   PLT 202.0 02/11/2014   GLUCOSE 96 02/11/2014   CHOL 148 02/11/2014   TRIG 86.0 02/11/2014   HDL 45.20 02/11/2014   LDLCALC 86 02/11/2014   ALT 26 02/11/2014   AST 20 02/11/2014   NA 142 02/11/2014   K 4.1 02/11/2014   CL 102 02/11/2014   CREATININE 1.1 02/11/2014   BUN 17 02/11/2014  CO2 27 02/11/2014   TSH 0.51 10/08/2010   PSA 0.85 02/11/2014   MICROALBUR 1.5 12/25/2012       Assessment & Plan:   Problem List Items Addressed This Visit      Unprioritized   Hyperlipidemia   Relevant Medications   valsartan (DIOVAN) tablet   Other Relevant Orders   Hepatic function panel   Lipid panel    Other Visit Diagnoses    Essential hypertension    -  Primary    Relevant Medications    valsartan (DIOVAN) tablet    Other Relevant Orders    Basic metabolic panel       I am having Mr. Trautman maintain his aspirin, Multiple Vitamin (MULTIVITAMIN PO), Coenzyme  Q-10, meloxicam, atorvastatin, mupirocin cream, fluocinonide ointment, trimethobenzamide, naftifine, NEXIUM, and valsartan.  Meds ordered this encounter  Medications  . DISCONTD: valsartan (DIOVAN) 80 MG tablet    Sig: Take 1 tablet (80 mg total) by mouth daily.    Dispense:  30 tablet    Refill:  0  . valsartan (DIOVAN) 80 MG tablet    Sig: Take 1 tablet (80 mg total) by mouth daily.    Dispense:  90 tablet    Refill:  [place], DO

## 2014-06-26 ENCOUNTER — Other Ambulatory Visit: Payer: Self-pay | Admitting: Family Medicine

## 2014-07-30 DIAGNOSIS — H2513 Age-related nuclear cataract, bilateral: Secondary | ICD-10-CM | POA: Diagnosis not present

## 2014-07-30 DIAGNOSIS — H5310 Unspecified subjective visual disturbances: Secondary | ICD-10-CM | POA: Diagnosis not present

## 2014-09-02 ENCOUNTER — Encounter: Payer: Self-pay | Admitting: Family Medicine

## 2014-09-02 ENCOUNTER — Other Ambulatory Visit: Payer: Self-pay

## 2014-09-02 MED ORDER — MELOXICAM 15 MG PO TABS: 15.0000 mg | ORAL_TABLET | Freq: Every day | ORAL | Status: DC

## 2014-09-02 NOTE — Telephone Encounter (Signed)
Ok to refill mobic with 3 refills rto 6 months which is september

## 2014-09-09 ENCOUNTER — Ambulatory Visit: Payer: Medicare Other | Admitting: Family Medicine

## 2014-11-15 ENCOUNTER — Other Ambulatory Visit: Payer: Self-pay | Admitting: Family Medicine

## 2014-12-23 ENCOUNTER — Other Ambulatory Visit: Payer: Self-pay | Admitting: Family Medicine

## 2015-01-01 DIAGNOSIS — Z23 Encounter for immunization: Secondary | ICD-10-CM | POA: Diagnosis not present

## 2015-03-03 ENCOUNTER — Ambulatory Visit (INDEPENDENT_AMBULATORY_CARE_PROVIDER_SITE_OTHER): Payer: Medicare Other | Admitting: Family Medicine

## 2015-03-03 ENCOUNTER — Encounter: Payer: Self-pay | Admitting: Family Medicine

## 2015-03-03 VITALS — BP 108/68 | HR 52 | Temp 98.1°F | Ht 70.0 in | Wt 242.8 lb

## 2015-03-03 DIAGNOSIS — Z0000 Encounter for general adult medical examination without abnormal findings: Secondary | ICD-10-CM

## 2015-03-03 DIAGNOSIS — E785 Hyperlipidemia, unspecified: Secondary | ICD-10-CM | POA: Diagnosis not present

## 2015-03-03 DIAGNOSIS — B356 Tinea cruris: Secondary | ICD-10-CM

## 2015-03-03 DIAGNOSIS — I10 Essential (primary) hypertension: Secondary | ICD-10-CM

## 2015-03-03 DIAGNOSIS — N400 Enlarged prostate without lower urinary tract symptoms: Secondary | ICD-10-CM

## 2015-03-03 DIAGNOSIS — K648 Other hemorrhoids: Secondary | ICD-10-CM

## 2015-03-03 LAB — CBC WITH DIFFERENTIAL/PLATELET
Basophils Absolute: 0 10*3/uL (ref 0.0–0.1)
Basophils Relative: 0.5 % (ref 0.0–3.0)
Eosinophils Absolute: 0 10*3/uL (ref 0.0–0.7)
Eosinophils Relative: 0.4 % (ref 0.0–5.0)
HCT: 45.6 % (ref 39.0–52.0)
Hemoglobin: 15.2 g/dL (ref 13.0–17.0)
Lymphocytes Relative: 21.9 % (ref 12.0–46.0)
Lymphs Abs: 1.8 10*3/uL (ref 0.7–4.0)
MCHC: 33.4 g/dL (ref 30.0–36.0)
MCV: 91.3 fl (ref 78.0–100.0)
Monocytes Absolute: 0.6 10*3/uL (ref 0.1–1.0)
Monocytes Relative: 7.2 % (ref 3.0–12.0)
Neutro Abs: 5.8 10*3/uL (ref 1.4–7.7)
Neutrophils Relative %: 70 % (ref 43.0–77.0)
Platelets: 200 10*3/uL (ref 150.0–400.0)
RBC: 5 Mil/uL (ref 4.22–5.81)
RDW: 14.4 % (ref 11.5–15.5)
WBC: 8.3 10*3/uL (ref 4.0–10.5)

## 2015-03-03 LAB — LIPID PANEL
Cholesterol: 110 mg/dL (ref 0–200)
HDL: 37.9 mg/dL — ABNORMAL LOW (ref >39.00)
LDL Cholesterol: 58 mg/dL (ref 0–99)
NonHDL: 72.54
Total CHOL/HDL Ratio: 3
Triglycerides: 75 mg/dL (ref 0.0–149.0)
VLDL: 15 mg/dL (ref 0.0–40.0)

## 2015-03-03 LAB — COMPREHENSIVE METABOLIC PANEL
ALT: 18 U/L (ref 0–53)
AST: 13 U/L (ref 0–37)
Albumin: 4.4 g/dL (ref 3.5–5.2)
Alkaline Phosphatase: 68 U/L (ref 39–117)
BUN: 12 mg/dL (ref 6–23)
CO2: 30 mEq/L (ref 19–32)
Calcium: 9.4 mg/dL (ref 8.4–10.5)
Chloride: 104 mEq/L (ref 96–112)
Creatinine, Ser: 0.95 mg/dL (ref 0.40–1.50)
GFR: 83.58 mL/min (ref >60.00)
Glucose, Bld: 91 mg/dL (ref 70–99)
Potassium: 4 mEq/L (ref 3.5–5.1)
Sodium: 141 mEq/L (ref 135–145)
Total Bilirubin: 0.8 mg/dL (ref 0.2–1.2)
Total Protein: 6.5 g/dL (ref 6.0–8.3)

## 2015-03-03 LAB — PSA: PSA: 0.99 ng/mL (ref 0.10–4.00)

## 2015-03-03 MED ORDER — FLUOCINONIDE 0.05 % EX OINT: 1.0000 "application " | TOPICAL_OINTMENT | CUTANEOUS | Status: DC | PRN

## 2015-03-03 MED ORDER — VALSARTAN 80 MG PO TABS: 80.0000 mg | ORAL_TABLET | Freq: Every day | ORAL | Status: DC

## 2015-03-03 MED ORDER — ATORVASTATIN CALCIUM 40 MG PO TABS: 40.0000 mg | ORAL_TABLET | Freq: Every day | ORAL | Status: DC

## 2015-03-03 MED ORDER — MUPIROCIN CALCIUM 2 % EX CREA: TOPICAL_CREAM | CUTANEOUS | Status: DC | PRN

## 2015-03-03 NOTE — Progress Notes (Signed)
-[id number]  Subjective:   Eric [name] is a 69 y.o. male who presents for Medicare Annual/Subsequent preventive examination.  Review of Systems:   Review of Systems  Constitutional: Negative for activity change, appetite change and fatigue.  HENT: Negative for hearing loss, congestion, tinnitus and ear discharge.   Eyes: Negative for visual disturbance (see optho q1y -- vision corrected to 20/20 with glasses).  Respiratory: Negative for cough, chest tightness and shortness of breath.   Cardiovascular: Negative for chest pain, palpitations and leg swelling.  Gastrointestinal: Negative for abdominal pain, diarrhea, constipation and abdominal distention.  Genitourinary: Negative for urgency, frequency, decreased urine volume and difficulty urinating.  Musculoskeletal: Negative for back pain, arthralgias and gait problem.  Skin: Negative for color change, pallor and rash.  Neurological: Negative for dizziness, light-headedness, numbness and headaches.  Hematological: Negative for adenopathy. Does not bruise/bleed easily.  Psychiatric/Behavioral: Negative for suicidal ideas, confusion, sleep disturbance, self-injury, dysphoric mood, decreased concentration and agitation.  Pt is able to read and write and can do all ADLs No risk for falling No abuse/ violence in home          Objective:    Vitals: BP 108/68 mmHg  Pulse 52  Temp(Src) 98.1 F (36.7 C) (Oral)  Ht _0  (1.778 m)  Wt 242 lb 12.8 oz (110.133 kg)  BMI 34.84 kg/m2  SpO2 97% BP 108/68 mmHg  Pulse 52  Temp(Src) 98.1 F (36.7 C) (Oral)  Ht _1  (1.778 m)  Wt 242 lb 12.8 oz (110.133 kg)  BMI 34.84 kg/m2  SpO2 97% General appearance: alert, cooperative, appears stated age and no distress Head: Normocephalic, without obvious abnormality, atraumatic Eyes: conjunctivae/corneas clear. PERRL, EOM's intact. Fundi benign. Ears: normal TM's and external ear canals [name] ears Nose: Nares normal. Septum midline.  Mucosa normal. No drainage or sinus tenderness. Throat: lips, mucosa, and tongue normal; teeth and gums normal Neck: no adenopathy, no carotid bruit, no JVD, supple, symmetrical, trachea midline and thyroid not enlarged, symmetric, no tenderness/mass/nodules Back: symmetric, no curvature. ROM normal. No CVA tenderness. Lungs: clear to auscultation bilaterally Chest wall: no tenderness Heart: regular rate and rhythm, S1, S2 normal, no murmur, click, rub or gallop Abdomen: soft, non-tender; bowel sounds normal; no masses,  no organomegaly Male genitalia: normal Rectal: normal tone, normal prostate, no masses or tenderness Extremities: extremities normal, atraumatic, no cyanosis or edema Pulses: 2+ and symmetric Skin: Skin color, texture, turgor normal. No rashes or lesions Lymph nodes: Cervical, supraclavicular, and axillary nodes normal. Neurologic: Alert and oriented X 3, normal strength and tone. Normal symmetric reflexes. Normal coordination and gait Psych- no depression, no anxiety Tobacco History  Smoking status  . Never Smoker   Smokeless tobacco  . Never Used     Counseling given: Not Answered   Past Medical History  Diagnosis Date  . Internal hemorrhoids without mention of complication   . Osteoarthrosis, unspecified whether generalized or localized, unspecified site   . GERD (gastroesophageal reflux disease)   . Hyperlipemia   . Colon polyps   . Diverticulosis of colon   . Sleep apnea     CPAP  . Hx of cardiovascular stress test     Lexiscan Myoview (04/2013):  No ischemia, EF 61%; low risk   Past Surgical History  Procedure Laterality Date  . Shoulder surgery  2009    right  . Tonsillectomy     Family History  Problem Relation Age of Onset  . Breast cancer    . Prostate cancer    .  Arthritis Mother   . Cancer Mother 47    breast  . Hyperlipidemia Mother   . Colon polyps Mother 47  . Heart disease Father     CAD--passed away 06-03-2011 age 76  . Hyperlipidemia  Father   . Hypertension Father   . Cancer Sister 15    breast  . Cancer Brother 45    prostate  . Colon cancer Neg Hx    History  Sexual Activity  . Sexual Activity:  . Partners: Female    Outpatient Encounter Prescriptions as of 03/03/2015  Medication Sig  . aspirin 81 MG EC tablet Take 81 mg by mouth daily.    [name] atorvastatin (LIPITOR) 40 MG tablet Take 1 tablet (40 mg total) by mouth daily. Repeat labs are due now  . Coenzyme Q-10 200 MG CAPS Take 1 each by mouth daily.  . fluocinonide ointment (LIDEX) 2.59 % Apply 1 application topically as needed.  . meloxicam (MOBIC) 15 MG tablet Take 1 tablet (15 mg total) by mouth daily.  . Multiple Vitamin (MULTIVITAMIN PO)    . mupirocin cream (BACTROBAN) 2 % Apply topically as needed.  . naftifine (NAFTIN) 1 % cream Apply topically daily.  [name] NEXIUM 40 MG capsule TAKE 1 CAPSULE DAILY  . trimethobenzamide (TIGAN) 300 MG capsule Take 1 capsule (300 mg total) by mouth as needed.  . valsartan (DIOVAN) 80 MG tablet TAKE 1 TABLET DAILY  . [DISCONTINUED] fluocinonide ointment (LIDEX) 5.63 % Apply 1 application topically as needed.  . [DISCONTINUED] mupirocin cream (BACTROBAN) 2 % Apply topically as needed.   No facility-administered encounter medications on file as of 03/03/2015.    Activities of Daily Living In your present state of health, do you have any difficulty performing the following activities: 03/03/2015  Hearing? N  Vision? N  Difficulty concentrating or making decisions? N  Walking or climbing stairs? N  Dressing or bathing? N  Doing errands, shopping? N    Patient Care Team: Rosalita Chessman, DO as PCP - General   Assessment:    CPE Exercise Activities and Dietary recommendations--- walking 5-6 miles a week    Goals    None     Fall Risk Fall Risk  03/03/2015 02/11/2014 12/25/2012 12/24/2011  Falls in the past year? No No No -  Risk for fall due to : - - - Impaired balance/gait  Risk for fall due to (comments): -  - - Has Arthritis in his ankles using a cane sometime   Depression Screen PHQ 2/9 Scores 03/03/2015 02/11/2014 12/25/2012 12/24/2011  PHQ - 2 Score 0 0 0 0    Cognitive Testing MMSE 30/30  Immunization History  Administered Date(s) Administered  . Influenza Split 12/24/2011  . Influenza Whole 01/01/2009  . Influenza, High Dose Seasonal PF 11/26/2013, 01/01/2015  . Influenza,inj,Quad PF,36+ Mos 01/15/2013  . Pneumococcal Conjugate-13 02/11/2014  . Pneumococcal Polysaccharide-23 12/24/2011  . Td 05/01/2008  . Tdap 01/01/2015  . Zoster 10/08/2010   Screening Tests Health Maintenance  Topic Date Due  . Hepatitis C Screening  09-13-1945  . INFLUENZA VACCINE  10/21/2015  . COLONOSCOPY  03/19/2018  . TETANUS/TDAP  12/31/2024  . ZOSTAVAX  Completed  . PNA vac Low Risk Adult  Completed      Plan:    During the course of the visit the patient was educated and counseled about the following appropriate screening and preventive services:   Vaccines to include Pneumoccal, Influenza, Hepatitis B, Td, Zostavax, HCV  Electrocardiogram  Cardiovascular  Disease  Colorectal cancer screening  Diabetes screening  Prostate Cancer Screening  Glaucoma screening  Nutrition counseling   Smoking cessation counseling  Patient Instructions (the written plan) was given to the patient.  1. Tinea cruris  - mupirocin cream (BACTROBAN) 2 %; Apply topically as needed.  Dispense: 15 g; Refill: 1  2. Other hemorrhoids   - fluocinonide ointment (LIDEX) 0.05 %; Apply 1 application topically as needed.  Dispense: 30 g; Refill: 1  3. Hyperlipidemia Check labs - Comp Met (CMET) - CBC with Differential/Platelet - Lipid panel  4. Essential hypertension stable - Comp Met (CMET) - CBC with Differential/Platelet - Lipid panel - PSA  5. BPH (benign prostatic hypertrophy)  - PSA  6. Medicare annual wellness visit, subsequent   7. Routine history and physical examination of  adult    Eric Koyanagi, DO  03/03/2015

## 2015-03-03 NOTE — Progress Notes (Signed)
Pre visit review using our clinic review tool, if applicable. No additional management support is needed unless otherwise documented below in the visit note. 

## 2015-03-03 NOTE — Patient Instructions (Addendum)

## 2015-03-04 ENCOUNTER — Encounter: Payer: Self-pay | Admitting: Family Medicine

## 2015-03-05 ENCOUNTER — Other Ambulatory Visit (INDEPENDENT_AMBULATORY_CARE_PROVIDER_SITE_OTHER): Payer: Medicare Other

## 2015-03-05 DIAGNOSIS — Z8639 Personal history of other endocrine, nutritional and metabolic disease: Secondary | ICD-10-CM

## 2015-03-05 LAB — HEMOGLOBIN A1C: Hgb A1c MFr Bld: 5.5 % (ref 4.6–6.5)

## 2015-03-10 ENCOUNTER — Encounter: Payer: Self-pay | Admitting: Family Medicine

## 2015-03-19 ENCOUNTER — Encounter: Payer: Self-pay | Admitting: *Deleted

## 2015-03-21 ENCOUNTER — Other Ambulatory Visit: Payer: Self-pay | Admitting: Family Medicine

## 2015-04-04 ENCOUNTER — Other Ambulatory Visit: Payer: Self-pay | Admitting: Family Medicine

## 2015-05-12 ENCOUNTER — Other Ambulatory Visit: Payer: Self-pay | Admitting: Family Medicine

## 2015-08-07 DIAGNOSIS — H2513 Age-related nuclear cataract, bilateral: Secondary | ICD-10-CM | POA: Diagnosis not present

## 2015-08-07 DIAGNOSIS — H5203 Hypermetropia, bilateral: Secondary | ICD-10-CM | POA: Diagnosis not present

## 2015-08-24 ENCOUNTER — Encounter: Payer: Self-pay | Admitting: Family Medicine

## 2015-08-24 DIAGNOSIS — M545 Low back pain: Secondary | ICD-10-CM

## 2015-08-24 DIAGNOSIS — M25512 Pain in left shoulder: Principal | ICD-10-CM

## 2015-08-24 DIAGNOSIS — M25511 Pain in right shoulder: Secondary | ICD-10-CM

## 2015-08-25 NOTE — Telephone Encounter (Signed)
That's a lot for f/u office---  It would have to be a separated visit or we can go ahead and refer him to ortho for the 2 problems if he feels it may be needed ( or sports med)

## 2015-08-28 ENCOUNTER — Ambulatory Visit: Payer: Medicare Other | Admitting: Family Medicine

## 2015-09-01 ENCOUNTER — Ambulatory Visit (INDEPENDENT_AMBULATORY_CARE_PROVIDER_SITE_OTHER): Payer: Medicare Other | Admitting: Family Medicine

## 2015-09-01 ENCOUNTER — Encounter: Payer: Self-pay | Admitting: Family Medicine

## 2015-09-01 VITALS — BP 138/79 | HR 53 | Ht 70.0 in | Wt 210.0 lb

## 2015-09-01 VITALS — BP 108/68 | HR 46 | Temp 98.8°F | Wt 212.6 lb

## 2015-09-01 DIAGNOSIS — K219 Gastro-esophageal reflux disease without esophagitis: Secondary | ICD-10-CM

## 2015-09-01 DIAGNOSIS — N400 Enlarged prostate without lower urinary tract symptoms: Secondary | ICD-10-CM | POA: Diagnosis not present

## 2015-09-01 DIAGNOSIS — R350 Frequency of micturition: Secondary | ICD-10-CM

## 2015-09-01 DIAGNOSIS — I10 Essential (primary) hypertension: Secondary | ICD-10-CM

## 2015-09-01 DIAGNOSIS — M545 Low back pain, unspecified: Secondary | ICD-10-CM

## 2015-09-01 LAB — POC URINALSYSI DIPSTICK (AUTOMATED)
Bilirubin, UA: NEGATIVE
Blood, UA: NEGATIVE
Glucose, UA: NEGATIVE
Ketones, UA: NEGATIVE
Leukocytes, UA: NEGATIVE
Nitrite, UA: NEGATIVE
Protein, UA: NEGATIVE
Spec Grav, UA: 1.015
Urobilinogen, UA: 2
pH, UA: 6.5

## 2015-09-01 LAB — CBC WITH DIFFERENTIAL/PLATELET
Basophils Absolute: 0 10*3/uL (ref 0.0–0.1)
Basophils Relative: 0.3 % (ref 0.0–3.0)
Eosinophils Absolute: 0 10*3/uL (ref 0.0–0.7)
Eosinophils Relative: 0.6 % (ref 0.0–5.0)
HCT: 41 % (ref 39.0–52.0)
Hemoglobin: 13.6 g/dL (ref 13.0–17.0)
Lymphocytes Relative: 24.9 % (ref 12.0–46.0)
Lymphs Abs: 1.6 10*3/uL (ref 0.7–4.0)
MCHC: 33.3 g/dL (ref 30.0–36.0)
MCV: 92.8 fl (ref 78.0–100.0)
Monocytes Absolute: 0.6 10*3/uL (ref 0.1–1.0)
Monocytes Relative: 8.5 % (ref 3.0–12.0)
Neutro Abs: 4.3 10*3/uL (ref 1.4–7.7)
Neutrophils Relative %: 65.7 % (ref 43.0–77.0)
Platelets: 156 10*3/uL (ref 150.0–400.0)
RBC: 4.42 Mil/uL (ref 4.22–5.81)
RDW: 14.1 % (ref 11.5–15.5)
WBC: 6.5 10*3/uL (ref 4.0–10.5)

## 2015-09-01 LAB — COMPREHENSIVE METABOLIC PANEL
ALT: 15 U/L (ref 0–53)
AST: 13 U/L (ref 0–37)
Albumin: 4.3 g/dL (ref 3.5–5.2)
Alkaline Phosphatase: 62 U/L (ref 39–117)
BUN: 16 mg/dL (ref 6–23)
CO2: 28 mEq/L (ref 19–32)
Calcium: 9.4 mg/dL (ref 8.4–10.5)
Chloride: 105 mEq/L (ref 96–112)
Creatinine, Ser: 0.81 mg/dL (ref 0.40–1.50)
GFR: 100.32 mL/min (ref >60.00)
Glucose, Bld: 87 mg/dL (ref 70–99)
Potassium: 4.1 mEq/L (ref 3.5–5.1)
Sodium: 142 mEq/L (ref 135–145)
Total Bilirubin: 0.8 mg/dL (ref 0.2–1.2)
Total Protein: 6.5 g/dL (ref 6.0–8.3)

## 2015-09-01 LAB — LIPID PANEL
Cholesterol: 115 mg/dL (ref 0–200)
HDL: 42.8 mg/dL (ref >39.00)
LDL Cholesterol: 62 mg/dL (ref 0–99)
NonHDL: 72.62
Total CHOL/HDL Ratio: 3
Triglycerides: 53 mg/dL (ref 0.0–149.0)
VLDL: 10.6 mg/dL (ref 0.0–40.0)

## 2015-09-01 LAB — PSA: PSA: 0.79 ng/mL (ref 0.10–4.00)

## 2015-09-01 MED ORDER — TAMSULOSIN HCL 0.4 MG PO CAPS: 0.4000 mg | ORAL_CAPSULE | Freq: Every day | ORAL | Status: DC

## 2015-09-01 MED ORDER — VALSARTAN 80 MG PO TABS: 80.0000 mg | ORAL_TABLET | Freq: Every day | ORAL | Status: DC

## 2015-09-01 MED ORDER — PANTOPRAZOLE SODIUM 40 MG PO TBEC: 40.0000 mg | DELAYED_RELEASE_TABLET | Freq: Every day | ORAL | Status: DC

## 2015-09-01 NOTE — Patient Instructions (Signed)
Dr. Mardelle Matte is the shoulder specialist I would recommend. We will arrange for epidural injections for your low back at the L5-S1 levels bilaterally. Call me a week after this to let me know how you're doing. Consider doing physical therapy as well. Do home exercises as directed daily. Ibuprofen or tylenol just as needed. I don't think there's a need to repeat the imaging of your low back at this time.

## 2015-09-01 NOTE — Patient Instructions (Signed)
Hypertension Hypertension, commonly called high blood pressure, is when the force of blood pumping through your arteries is too strong. Your arteries are the blood vessels that carry blood from your heart throughout your body. A blood pressure reading consists of a higher number over a lower number, such as 110/72. The higher number (systolic) is the pressure inside your arteries when your heart pumps. The lower number (diastolic) is the pressure inside your arteries when your heart relaxes. Ideally you want your blood pressure below 120/80. Hypertension forces your heart to work harder to pump blood. Your arteries may become narrow or stiff. Having untreated or uncontrolled hypertension can cause heart attack, stroke, kidney disease, and other problems. RISK FACTORS Some risk factors for high blood pressure are controllable. Others are not.  Risk factors you cannot control include:   Race. You may be at higher risk if you are African American.  Age. Risk increases with age.  Gender. Men are at higher risk than women before age 45 years. After age 65, women are at higher risk than men. Risk factors you can control include:  Not getting enough exercise or physical activity.  Being overweight.  Getting too much fat, sugar, calories, or salt in your diet.  Drinking too much alcohol. SIGNS AND SYMPTOMS Hypertension does not usually cause signs or symptoms. Extremely high blood pressure (hypertensive crisis) may cause headache, anxiety, shortness of breath, and nosebleed. DIAGNOSIS To check if you have hypertension, your health care provider will measure your blood pressure while you are seated, with your arm held at the level of your heart. It should be measured at least twice using the same arm. Certain conditions can cause a difference in blood pressure between your right and left arms. A blood pressure reading that is higher than normal on one occasion does not mean that you need treatment. If  it is not clear whether you have high blood pressure, you may be asked to return on a different day to have your blood pressure checked again. Or, you may be asked to monitor your blood pressure at home for 1 or more weeks. TREATMENT Treating high blood pressure includes making lifestyle changes and possibly taking medicine. Living a healthy lifestyle can help lower high blood pressure. You may need to change some of your habits. Lifestyle changes may include:  Following the DASH diet. This diet is high in fruits, vegetables, and whole grains. It is low in salt, red meat, and added sugars.  Keep your sodium intake below 2,300 mg per day.  Getting at least 30-45 minutes of aerobic exercise at least 4 times per week.  Losing weight if necessary.  Not smoking.  Limiting alcoholic beverages.  Learning ways to reduce stress. Your health care provider may prescribe medicine if lifestyle changes are not enough to get your blood pressure under control, and if one of the following is true:  You are 18-59 years of age and your systolic blood pressure is above 140.  You are 60 years of age or older, and your systolic blood pressure is above 150.  Your diastolic blood pressure is above 90.  You have diabetes, and your systolic blood pressure is over 140 or your diastolic blood pressure is over 90.  You have kidney disease and your blood pressure is above 140/90.  You have heart disease and your blood pressure is above 140/90. Your personal target blood pressure may vary depending on your medical conditions, your age, and other factors. HOME CARE INSTRUCTIONS    Have your blood pressure rechecked as directed by your health care provider.   Take medicines only as directed by your health care provider. Follow the directions carefully. Blood pressure medicines must be taken as prescribed. The medicine does not work as well when you skip doses. Skipping doses also puts you at risk for  problems.  Do not smoke.   Monitor your blood pressure at home as directed by your health care provider. SEEK MEDICAL CARE IF:   You think you are having a reaction to medicines taken.  You have recurrent headaches or feel dizzy.  You have swelling in your ankles.  You have trouble with your vision. SEEK IMMEDIATE MEDICAL CARE IF:  You develop a severe headache or confusion.  You have unusual weakness, numbness, or feel faint.  You have severe chest or abdominal pain.  You vomit repeatedly.  You have trouble breathing. MAKE SURE YOU:   Understand these instructions.  Will watch your condition.  Will get help right away if you are not doing well or get worse.   This information is not intended to replace advice given to you by your health care provider. Make sure you discuss any questions you have with your health care provider.   Document Released: 03/08/2005 Document Revised: 07/23/2014 Document Reviewed: 12/29/2012 Elsevier Interactive Patient Education 2016 [employer]  

## 2015-09-01 NOTE — Progress Notes (Signed)
Pre visit review using our clinic review tool, if applicable. No additional management support is needed unless otherwise documented below in the visit note. 

## 2015-09-01 NOTE — Progress Notes (Signed)
Subjective:    Patient ID: Eric [name], male    DOB: 1945/12/18, 70 y.o.   MRN: [id number]  Chief Complaint  Patient presents with  . Hypertension    follow up  . Gastroesophageal Reflux    Needs to switch med's  . Urinary Frequency    and urgency x's several mos    HPI Patient is in today for f/u cholesterol, gerd and bph.   No complaints.  Except that he is ready to start something for bph---  See ov for cpe Past Medical History  Diagnosis Date  . Internal hemorrhoids without mention of complication   . Osteoarthrosis, unspecified whether generalized or localized, unspecified site   . GERD (gastroesophageal reflux disease)   . Hyperlipemia   . Colon polyps   . Diverticulosis of colon   . Sleep apnea     CPAP  . Hx of cardiovascular stress test     Lexiscan Myoview (04/2013):  No ischemia, EF 61%; low risk    Past Surgical History  Procedure Laterality Date  . Shoulder surgery  July 15, 2007    right  . Tonsillectomy      Family History  Problem Relation Age of Onset  . Breast cancer    . Prostate cancer    . Arthritis Mother   . Cancer Mother 39    breast  . Hyperlipidemia Mother   . Colon polyps Mother 49  . Heart disease Father     CAD--passed away 07/15/2011 age 65  . Hyperlipidemia Father   . Hypertension Father   . Cancer Sister 108    breast  . Cancer Brother 70    prostate  . Colon cancer Neg Hx     Social History   Social History  . Marital Status: Married    Spouse Name: N/A  . Number of Children: 3  . Years of Education: N/A   Occupational History  . Retired--[employer]    Social History Main Topics  . Smoking status: Never Smoker   . Smokeless tobacco: Never Used  . Alcohol Use: 0.0 oz/week    0 Standard drinks or equivalent per week     Comment: rare----12 a year  . Drug Use: No  . Sexual Activity:    Partners: Female   Other Topics Concern  . Not on file   Social History Narrative   Lives with wife and daughter and two  grandchildren.     Outpatient Prescriptions Prior to Visit  Medication Sig Dispense Refill  . aspirin 81 MG EC tablet Take 81 mg by mouth daily.      [name] atorvastatin (LIPITOR) 40 MG tablet Take 1 tablet (40 mg total) by mouth daily. 90 tablet 1  . Coenzyme Q-10 200 MG CAPS Take 1 each by mouth daily.    . fluocinonide ointment (LIDEX) [id number] % Apply 1 application topically as needed. 30 g 1  . meloxicam (MOBIC) 15 MG tablet TAKE 1 TABLET DAILY 90 tablet 1  . Multiple Vitamin (MULTIVITAMIN PO)      . mupirocin cream (BACTROBAN) 2 % Apply topically as needed. 15 g 1  . naftifine (NAFTIN) 1 % cream Apply topically daily. 90 g 3  . trimethobenzamide (TIGAN) 300 MG capsule Take 1 capsule (300 mg total) by mouth as needed. 20 capsule 0  . NEXIUM 40 MG capsule TAKE 1 CAPSULE DAILY 90 capsule 3  . valsartan (DIOVAN) 80 MG tablet TAKE 1 TABLET DAILY 90 tablet 1   No  facility-administered medications prior to visit.    Allergies  Allergen Reactions  . Lisinopril Cough    Review of Systems  Constitutional: Negative for fever, chills and malaise/fatigue.  HENT: Negative for congestion and hearing loss.   Eyes: Negative for discharge.  Respiratory: Negative for cough, sputum production and shortness of breath.   Cardiovascular: Negative for chest pain, palpitations and leg swelling.  Gastrointestinal: Negative for heartburn, nausea, vomiting, abdominal pain, diarrhea, constipation and blood in stool.  Genitourinary: Negative for dysuria, urgency, frequency and hematuria.  Musculoskeletal: Negative for myalgias, back pain and falls.  Skin: Negative for rash.  Neurological: Negative for dizziness, sensory change, loss of consciousness, weakness and headaches.  Endo/Heme/Allergies: Negative for environmental allergies. Does not bruise/bleed easily.  Psychiatric/Behavioral: Negative for depression and suicidal ideas. The patient is not nervous/anxious and does not have insomnia.          Objective:    Physical Exam  Constitutional: He is oriented to person, place, and time. Vital signs are normal. He appears well-developed and well-nourished. He is sleeping.  HENT:  Head: Normocephalic and atraumatic.  Mouth/Throat: Oropharynx is clear and moist.  Eyes: EOM are normal. Pupils are equal, round, and reactive to light.  Neck: Normal range of motion. Neck supple. No thyromegaly present.  Cardiovascular: Normal rate and regular rhythm.   No murmur heard. Pulmonary/Chest: Effort normal and breath sounds normal. No respiratory distress. He has no wheezes. He has no rales. He exhibits no tenderness.  Musculoskeletal: He exhibits no edema or tenderness.  Neurological: He is alert and oriented to person, place, and time.  Skin: Skin is warm and dry.  Psychiatric: He has a normal mood and affect. His behavior is normal. Judgment and thought content normal.  Nursing note and vitals reviewed.   BP 108/68 mmHg  Pulse 46  Temp(Src) 98.8 F (37.1 C) (Oral)  Wt 212 lb 9.6 oz (96.435 kg)  SpO2 99% Wt Readings from Last 3 Encounters:  09/01/15 210 lb (95.255 kg)  09/01/15 212 lb 9.6 oz (96.435 kg)  03/03/15 242 lb 12.8 oz (110.133 kg)     Lab Results  Component Value Date   WBC 8.3 03/03/2015   HGB 15.2 03/03/2015   HCT 45.6 03/03/2015   PLT 200.0 03/03/2015   GLUCOSE 91 03/03/2015   CHOL 110 03/03/2015   TRIG 75.0 03/03/2015   HDL 37.90* 03/03/2015   LDLCALC 58 03/03/2015   ALT 18 03/03/2015   AST 13 03/03/2015   NA 141 03/03/2015   K 4.0 03/03/2015   CL 104 03/03/2015   CREATININE 0.95 03/03/2015   BUN 12 03/03/2015   CO2 30 03/03/2015   TSH 0.51 10/08/2010   PSA 0.99 03/03/2015   HGBA1C 5.5 03/05/2015   MICROALBUR 1.5 12/25/2012    Lab Results  Component Value Date   TSH 0.51 10/08/2010   Lab Results  Component Value Date   WBC 8.3 03/03/2015   HGB 15.2 03/03/2015   HCT 45.6 03/03/2015   MCV 91.3 03/03/2015   PLT 200.0 03/03/2015   Lab Results   Component Value Date   NA 141 03/03/2015   K 4.0 03/03/2015   CO2 30 03/03/2015   GLUCOSE 91 03/03/2015   BUN 12 03/03/2015   CREATININE 0.95 03/03/2015   BILITOT 0.8 03/03/2015   ALKPHOS 68 03/03/2015   AST 13 03/03/2015   ALT 18 03/03/2015   PROT 6.5 03/03/2015   ALBUMIN 4.4 03/03/2015   CALCIUM 9.4 03/03/2015   GFR 83.58 03/03/2015  Lab Results  Component Value Date   CHOL 110 03/03/2015   Lab Results  Component Value Date   HDL 37.90* 03/03/2015   Lab Results  Component Value Date   LDLCALC 58 03/03/2015   Lab Results  Component Value Date   TRIG 75.0 03/03/2015   Lab Results  Component Value Date   CHOLHDL 3 03/03/2015   Lab Results  Component Value Date   HGBA1C 5.5 03/05/2015       Assessment & Plan:   Problem List Items Addressed This Visit    GERD   Relevant Medications   pantoprazole (PROTONIX) 40 MG tablet   Other Relevant Orders   POCT urinalysis dipstick   PSA   Lipid panel   CBC with Differential/Platelet   Comprehensive metabolic panel    Other Visit Diagnoses    BPH (benign prostatic hypertrophy)    -  Primary    Relevant Medications    tamsulosin (FLOMAX) 0.4 MG CAPS capsule    Other Relevant Orders    POCT urinalysis dipstick    PSA    Lipid panel    CBC with Differential/Platelet    Comprehensive metabolic panel    Urine frequency        Relevant Orders    POCT Urinalysis Dipstick (Automated) (Completed)    POCT urinalysis dipstick    Essential hypertension        Relevant Medications    valsartan (DIOVAN) 80 MG tablet    Other Relevant Orders    POCT urinalysis dipstick    PSA    Lipid panel    CBC with Differential/Platelet    Comprehensive metabolic panel       I have discontinued Mr. Rob Nidiffer. I have also changed his valsartan. Additionally, I am having him start on pantoprazole and tamsulosin. Lastly, I am having him maintain his aspirin, Multiple Vitamin (MULTIVITAMIN PO), Coenzyme Q-10,  trimethobenzamide, naftifine, mupirocin cream, fluocinonide ointment, atorvastatin, and meloxicam.  Meds ordered this encounter  Medications  . pantoprazole (PROTONIX) 40 MG tablet    Sig: Take 1 tablet (40 mg total) by mouth daily.    Dispense:  90 tablet    Refill:  3  . tamsulosin (FLOMAX) 0.4 MG CAPS capsule    Sig: Take 1 capsule (0.4 mg total) by mouth daily.    Dispense:  90 capsule    Refill:  3  . valsartan (DIOVAN) 80 MG tablet    Sig: Take 1 tablet (80 mg total) by mouth daily.    Dispense:  90 tablet    Refill:  1     [name], DO

## 2015-09-02 ENCOUNTER — Other Ambulatory Visit: Payer: Self-pay | Admitting: Family Medicine

## 2015-09-02 ENCOUNTER — Other Ambulatory Visit: Payer: Self-pay

## 2015-09-02 ENCOUNTER — Encounter: Payer: Self-pay | Admitting: Family Medicine

## 2015-09-02 DIAGNOSIS — G8929 Other chronic pain: Secondary | ICD-10-CM

## 2015-09-02 DIAGNOSIS — M545 Low back pain, unspecified: Secondary | ICD-10-CM

## 2015-09-02 MED ORDER — ATORVASTATIN CALCIUM 40 MG PO TABS: 20.0000 mg | ORAL_TABLET | Freq: Every day | ORAL | Status: DC

## 2015-09-03 DIAGNOSIS — M545 Low back pain, unspecified: Secondary | ICD-10-CM | POA: Insufficient documentation

## 2015-09-03 NOTE — Progress Notes (Signed)
PCP: [name], DO  Subjective:   HPI: Patient is a 70 y.o. male here for low back pain.  Patient reports his low back has been bothering him more recently. Pain is 3/10 level and dull but can be sharp with a lot of activities. Past year has been more constant and severe. Numbness into left toes. Pain primarily in center of low back. Worse with yardwork, specifically using the weedeater. MRI from 05/2013 showed mild-moderate L5-S1 foraminal stenosis bilaterally. No bowel/bladder dysfunction.  Past Medical History  Diagnosis Date  . Internal hemorrhoids without mention of complication   . Osteoarthrosis, unspecified whether generalized or localized, unspecified site   . GERD (gastroesophageal reflux disease)   . Hyperlipemia   . Colon polyps   . Diverticulosis of colon   . Sleep apnea     CPAP  . Hx of cardiovascular stress test     Lexiscan Myoview (04/2013):  No ischemia, EF 61%; low risk    Current Outpatient Prescriptions on File Prior to Visit  Medication Sig Dispense Refill  . aspirin 81 MG EC tablet Take 81 mg by mouth daily.      . Coenzyme Q-10 200 MG CAPS Take 1 each by mouth daily.    . fluocinonide ointment (LIDEX) [id number] % Apply 1 application topically as needed. 30 g 1  . meloxicam (MOBIC) 15 MG tablet TAKE 1 TABLET DAILY 90 tablet 1  . Multiple Vitamin (MULTIVITAMIN PO)      . mupirocin cream (BACTROBAN) 2 % Apply topically as needed. 15 g 1  . naftifine (NAFTIN) 1 % cream Apply topically daily. 90 g 3  . pantoprazole (PROTONIX) 40 MG tablet Take 1 tablet (40 mg total) by mouth daily. 90 tablet 3  . tamsulosin (FLOMAX) 0.4 MG CAPS capsule Take 1 capsule (0.4 mg total) by mouth daily. 90 capsule 3  . trimethobenzamide (TIGAN) 300 MG capsule Take 1 capsule (300 mg total) by mouth as needed. 20 capsule 0  . valsartan (DIOVAN) 80 MG tablet Take 1 tablet (80 mg total) by mouth daily. 90 tablet 1   No current facility-administered medications on file prior  to visit.    Past Surgical History  Procedure Laterality Date  . Shoulder surgery  2007/06/25    right  . Tonsillectomy      Allergies  Allergen Reactions  . Lisinopril Cough    Social History   Social History  . Marital Status: Married    Spouse Name: N/A  . Number of Children: 3  . Years of Education: N/A   Occupational History  . Retired--[employer]    Social History Main Topics  . Smoking status: Never Smoker   . Smokeless tobacco: Never Used  . Alcohol Use: 0.0 oz/week    0 Standard drinks or equivalent per week     Comment: rare----12 a year  . Drug Use: No  . Sexual Activity:    Partners: Female   Other Topics Concern  . Not on file   Social History Narrative   Lives with wife and daughter and two grandchildren.     Family History  Problem Relation Age of Onset  . Breast cancer    . Prostate cancer    . Arthritis Mother   . Cancer Mother 30    breast  . Hyperlipidemia Mother   . Colon polyps Mother 62  . Heart disease Father     CAD--passed away 06-25-2011 age 64  . Hyperlipidemia Father   .  Hypertension Father   . Cancer Sister 88    breast  . Cancer Brother 51    prostate  . Colon cancer Neg Hx     BP 138/79 mmHg  Pulse 53  Ht 5\' 10"  (1.778 m)  Wt 210 lb (95.255 kg)  BMI 30.13 kg/m2  Review of Systems: See HPI above.    Objective:  Physical Exam:  Gen: NAD, comfortable in exam room  Back: No gross deformity, scoliosis. TTP mild low lumbar in midline.  No other tenderness. FROM with pain on flexion > extension. Strength LEs 5/5 all muscle groups.   2+ MSRs in patellar and achilles tendons, equal bilaterally. Negative SLRs. Sensation intact to light touch bilaterally. Negative logroll bilateral hips Negative fabers and piriformis stretches.    Assessment & Plan:  1. Low back pain - symptoms consistent with his known degenerative disc disease, facet arthropathy with mild-mod foraminal stenosis at L5-S1.  We discussed  medications, physical therapy, ESIs.  He would like to try ESIs and call us in a week.  Ibuprofen, tylenol as needed.  Consider physical therapy.  Call us a week after injection to let us know how he's doing.

## 2015-09-03 NOTE — Assessment & Plan Note (Signed)
symptoms consistent with his known degenerative disc disease, facet arthropathy with mild-mod foraminal stenosis at L5-S1.  We discussed medications, physical therapy, ESIs.  He would like to try ESIs and call us in a week.  Ibuprofen, tylenol as needed.  Consider physical therapy.  Call us a week after injection to let us know how he's doing.

## 2015-09-10 ENCOUNTER — Other Ambulatory Visit: Payer: Self-pay | Admitting: Orthopedic Surgery

## 2015-09-10 DIAGNOSIS — M19012 Primary osteoarthritis, left shoulder: Secondary | ICD-10-CM | POA: Diagnosis not present

## 2015-09-10 DIAGNOSIS — M19011 Primary osteoarthritis, right shoulder: Secondary | ICD-10-CM

## 2015-09-15 ENCOUNTER — Ambulatory Visit
Admission: RE | Admit: 2015-09-15 | Discharge: 2015-09-15 | Disposition: A | Discharge status: Home / Self Care | Payer: Medicare Other | Source: Ambulatory Visit | Attending: Family Medicine | Admitting: Family Medicine

## 2015-09-15 DIAGNOSIS — G8929 Other chronic pain: Secondary | ICD-10-CM

## 2015-09-15 DIAGNOSIS — M545 Low back pain, unspecified: Secondary | ICD-10-CM

## 2015-09-15 DIAGNOSIS — M5126 Other intervertebral disc displacement, lumbar region: Secondary | ICD-10-CM | POA: Diagnosis not present

## 2015-09-15 MED ORDER — METHYLPREDNISOLONE ACETATE 40 MG/ML INJ SUSP (RADIOLOG
120.0000 mg | Freq: Once | INTRAMUSCULAR | Status: AC
  Administered 2015-09-15: 120 mg via EPIDURAL

## 2015-09-15 MED ORDER — IOPAMIDOL (ISOVUE-M 200) INJECTION 41%
1.0000 mL | Freq: Once | INTRAMUSCULAR | Status: AC
  Administered 2015-09-15: 1 mL via EPIDURAL

## 2015-09-15 NOTE — Discharge Instructions (Signed)

## 2015-09-17 ENCOUNTER — Other Ambulatory Visit: Payer: Self-pay | Admitting: Family Medicine

## 2015-09-17 ENCOUNTER — Other Ambulatory Visit: Payer: Self-pay | Admitting: Orthopedic Surgery

## 2015-09-18 ENCOUNTER — Ambulatory Visit
Admission: RE | Admit: 2015-09-18 | Discharge: 2015-09-18 | Disposition: A | Discharge status: Home / Self Care | Payer: Medicare Other | Source: Ambulatory Visit | Attending: Orthopedic Surgery | Admitting: Orthopedic Surgery

## 2015-09-18 DIAGNOSIS — M19012 Primary osteoarthritis, left shoulder: Principal | ICD-10-CM

## 2015-09-18 DIAGNOSIS — M19011 Primary osteoarthritis, right shoulder: Secondary | ICD-10-CM

## 2015-09-18 DIAGNOSIS — M25411 Effusion, right shoulder: Secondary | ICD-10-CM | POA: Diagnosis not present

## 2015-09-18 DIAGNOSIS — M25412 Effusion, left shoulder: Secondary | ICD-10-CM | POA: Diagnosis not present

## 2015-09-29 ENCOUNTER — Encounter: Payer: Self-pay | Admitting: Family Medicine

## 2015-09-30 ENCOUNTER — Encounter: Payer: Self-pay | Admitting: Family Medicine

## 2015-10-01 ENCOUNTER — Other Ambulatory Visit: Payer: Self-pay | Admitting: Family Medicine

## 2015-10-01 ENCOUNTER — Other Ambulatory Visit (HOSPITAL_COMMUNITY): Payer: Self-pay | Admitting: *Deleted

## 2015-10-01 DIAGNOSIS — E041 Nontoxic single thyroid nodule: Secondary | ICD-10-CM

## 2015-10-01 NOTE — Pre-Procedure Instructions (Addendum)
Eric [name]  10/01/2015      [facility] - [place], [place] - [address] [place] [address] Phone: [phone] Fax: [phone]  [facility] [place], [place] [address] [place] [address] Phone: [phone] Fax: [phone]    Your procedure is scheduled on Tuesday, October 14, 2015 at 7:30 AM.   Report to [facility] Entrance "A" Admitting Office at 5:30 AM.   Call this number if you have problems the morning of surgery: [phone]   Any questions prior to day of surgery, please call [phone] between 8 & 4 PM.   Remember:  Do not eat food or drink liquids after midnight Monday, 10/13/15.  Take these medicines the morning of surgery with A SIP OF WATER: Loratadine (Claritin), Tamsulosin (Flomax), Flonase - if needed  Stop Aspirin, NSAIDS (Ibuprofen, Aleve, etc.), Multivitamins and Fish Oil 7 days prior to surgery.   Do not wear jewelry.  Do not wear lotions, powders, or cologne.  You may wear deoderant.  Men may shave face and neck.  Do not bring valuables to the hospital.  [facility] is not responsible for any belongings or valuables.  Contacts, dentures or bridgework may not be worn into surgery.  Leave your suitcase in the car.  After surgery it may be brought to your room.  For patients admitted to the hospital, discharge time will be determined by your treatment team.  Special instructions:   [facility]- Preparing For Surgery  Before surgery, you can play an important role. Because skin is not sterile, your skin needs to be as free of germs as possible. You can reduce the number of germs on your skin by washing with CHG (chlorahexidine gluconate) Soap before surgery.  CHG is an antiseptic cleaner which kills germs and bonds with the skin to continue killing germs even after washing.  Please do not use if you have an allergy to CHG or antibacterial soaps.  If your skin becomes reddened/irritated stop using the CHG.  Do not shave (including legs and underarms) for at least 48 hours prior to first CHG shower. It is OK to shave your face.  Please follow these instructions carefully.   1. Shower the NIGHT BEFORE SURGERY and the MORNING OF SURGERY with CHG.   2. If you chose to wash your hair, wash your hair first as usual with your normal shampoo.  3. After you shampoo, rinse your hair and body thoroughly to remove the shampoo.  4. Use CHG as you would any other liquid soap. You can apply CHG directly to the skin and wash gently with a scrungie or a clean washcloth.   5. Apply the CHG Soap to your body ONLY FROM THE NECK DOWN.  Do not use on open wounds or open sores. Avoid contact with your eyes, ears, mouth and genitals (private parts). Wash genitals (private parts) with your normal soap.  6. Wash thoroughly, paying special attention to the area where your surgery will be performed.  7. Thoroughly rinse your body with warm water from the neck down.  8. DO NOT shower/wash with your normal soap after using and rinsing off the CHG Soap.  9. Pat yourself dry with a CLEAN TOWEL.   10. Wear CLEAN PAJAMAS   11. Place CLEAN SHEETS on your bed the night of your first shower and DO NOT SLEEP WITH PETS.  Day of Surgery: Do not apply any deodorants/lotions. Please wear clean clothes to the hospital/surgery center.       Please read over the following fact sheets that you were given. Pain Booklet, Coughing and Deep Breathing, MRSA Information and Surgical Site Infection Prevention

## 2015-10-02 ENCOUNTER — Encounter: Payer: Self-pay | Admitting: Family Medicine

## 2015-10-02 ENCOUNTER — Encounter (HOSPITAL_COMMUNITY): Payer: Self-pay

## 2015-10-02 ENCOUNTER — Encounter (HOSPITAL_COMMUNITY)
Admission: RE | Admit: 2015-10-02 | Discharge: 2015-10-02 | Disposition: A | Discharge status: Home / Self Care | Payer: Medicare Other | Source: Ambulatory Visit | Attending: Orthopedic Surgery | Admitting: Orthopedic Surgery

## 2015-10-02 ENCOUNTER — Other Ambulatory Visit: Payer: Self-pay | Admitting: Family Medicine

## 2015-10-02 DIAGNOSIS — K5790 Diverticulosis of intestine, part unspecified, without perforation or abscess without bleeding: Secondary | ICD-10-CM | POA: Diagnosis not present

## 2015-10-02 DIAGNOSIS — Z01812 Encounter for preprocedural laboratory examination: Secondary | ICD-10-CM | POA: Insufficient documentation

## 2015-10-02 DIAGNOSIS — E785 Hyperlipidemia, unspecified: Secondary | ICD-10-CM | POA: Insufficient documentation

## 2015-10-02 DIAGNOSIS — K219 Gastro-esophageal reflux disease without esophagitis: Secondary | ICD-10-CM | POA: Diagnosis not present

## 2015-10-02 DIAGNOSIS — Z01810 Encounter for preprocedural cardiovascular examination: Secondary | ICD-10-CM | POA: Insufficient documentation

## 2015-10-02 DIAGNOSIS — R002 Palpitations: Secondary | ICD-10-CM

## 2015-10-02 HISTORY — DX: Other seasonal allergic rhinitis: J30.2

## 2015-10-02 HISTORY — DX: Personal history of other diseases of the respiratory system: Z87.09

## 2015-10-02 HISTORY — DX: Cardiac arrhythmia, unspecified: I49.9

## 2015-10-02 HISTORY — DX: Abnormal weight loss: R63.4

## 2015-10-02 LAB — CBC
HCT: 43.6 % (ref 39.0–52.0)
Hemoglobin: 14 g/dL (ref 13.0–17.0)
MCH: 31 pg (ref 26.0–34.0)
MCHC: 32.1 g/dL (ref 30.0–36.0)
MCV: 96.5 fL (ref 78.0–100.0)
Platelets: 158 10*3/uL (ref 150–400)
RBC: 4.52 MIL/uL (ref 4.22–5.81)
RDW: 13.8 % (ref 11.5–15.5)
WBC: 6.8 10*3/uL (ref 4.0–10.5)

## 2015-10-02 LAB — BASIC METABOLIC PANEL
Anion gap: 8 (ref 5–15)
BUN: 14 mg/dL (ref 6–20)
CO2: 27 mmol/L (ref 22–32)
Calcium: 9.2 mg/dL (ref 8.9–10.3)
Chloride: 105 mmol/L (ref 101–111)
Creatinine, Ser: 0.92 mg/dL (ref 0.61–1.24)
GFR calc Af Amer: >60 mL/min (ref >60)
GFR calc non Af Amer: >60 mL/min (ref >60)
Glucose, Bld: 95 mg/dL (ref 65–99)
Potassium: 3.9 mmol/L (ref 3.5–5.1)
Sodium: 140 mmol/L (ref 135–145)

## 2015-10-02 LAB — SURGICAL PCR SCREEN
MRSA, PCR: NEGATIVE
Staphylococcus aureus: NEGATIVE

## 2015-10-02 NOTE — Progress Notes (Addendum)
PCP - Alvy Beal Cardiologist - Dr. Percival Spanish - saw him about 2+ years ago was told that he did not need to go back to see MD.    Chest x-ray - denies EKG - 10/02/15 Stress Test - 04/27/13 ECHO - 01/01/13 Cardiac Cath - denies  Patient reported he had palpations when he drank caffeine and was told to stop drinking it.  Will send to anesthesia for review for possible cardiology clearance.   Patient has sleep apnea wears CPAP settings at 15 instructed patient to bring mask and tubing with him day of surgery Patient denies shortness of breath, fever, cough and chest pain at PAT appointment

## 2015-10-03 ENCOUNTER — Ambulatory Visit: Payer: Medicare Other | Admitting: Family Medicine

## 2015-10-03 ENCOUNTER — Ambulatory Visit
Admission: RE | Admit: 2015-10-03 | Discharge: 2015-10-03 | Disposition: A | Discharge status: Home / Self Care | Payer: Medicare Other | Source: Ambulatory Visit | Attending: Family Medicine | Admitting: Family Medicine

## 2015-10-03 ENCOUNTER — Other Ambulatory Visit: Payer: Self-pay | Admitting: Family Medicine

## 2015-10-03 ENCOUNTER — Other Ambulatory Visit (INDEPENDENT_AMBULATORY_CARE_PROVIDER_SITE_OTHER): Payer: Medicare Other

## 2015-10-03 VITALS — HR 46

## 2015-10-03 DIAGNOSIS — R002 Palpitations: Secondary | ICD-10-CM

## 2015-10-03 DIAGNOSIS — E041 Nontoxic single thyroid nodule: Secondary | ICD-10-CM

## 2015-10-03 DIAGNOSIS — I10 Essential (primary) hypertension: Secondary | ICD-10-CM

## 2015-10-03 DIAGNOSIS — E042 Nontoxic multinodular goiter: Secondary | ICD-10-CM | POA: Diagnosis not present

## 2015-10-03 LAB — THYROID PANEL WITH TSH
Free Thyroxine Index: 2.3 (ref 1.4–3.8)
T3 Uptake: 33 % (ref 22–35)
T4, Total: 6.9 ug/dL (ref 4.5–12.0)
TSH: 0.62 mIU/L (ref 0.40–4.50)

## 2015-10-03 NOTE — Telephone Encounter (Signed)
I put an order in for labs

## 2015-10-03 NOTE — Progress Notes (Signed)
Pre visit review using our clinic review tool, if applicable. No additional management support is needed unless otherwise documented below in the visit note.  Pt here for lab appt and requested BP check, okay per Dr. Carollee Herter, but pt will need to schedule OV if BP is high.  Pt not taking valsartan, states he was instructed to stop this medication at last OV and to check BP periodically. BP today WNL at 130/78. Pt will follow-up w/ Dr. Etter Sjogren as scheduled.  Dorrene German, RN

## 2015-10-06 ENCOUNTER — Encounter: Payer: Self-pay | Admitting: Family Medicine

## 2015-10-06 ENCOUNTER — Telehealth: Payer: Self-pay | Admitting: *Deleted

## 2015-10-06 NOTE — Telephone Encounter (Signed)
Form along with most recent EKG forwarded to Dr. Carollee Herter

## 2015-10-06 NOTE — Progress Notes (Addendum)
Anesthesia Chart Review:  Patient is a 70 year old male scheduled for left shoulder total shoulder arthroplasty versus shoulder arthroplasty on 10/14/15 by Dr. Mardelle Matte.  History includes never smoker, GERD, HLD, OSA (CPAP), palpitations (PACs), diverticulosis, tonsillectomy. BMI is consistent with mild obesity.   PCP is Dr. Carollee Herter. He is not followed by a cardiologist routinely, but saw Dr. Percival Spanish in 01/2013 for evaluation of palpitations with improvement (but not resolution) off caffeine. 21 day event monitor showed PACs but no sustained arrhythmias. Echo was unremarkable. Nuclear stress test was low risk. No additional medication changes recommended unless palpitations become more bothersome and then would consider adding a calcium channel blocker or beta blocker.   Meds include ASA 81 mg, Lipitor, Nexium, Flonase, Claritin, Flomax, Tigan PRN.  PAT Vitals: BP 117/71, HR 51, RR 20, T 36.1C, O2 sat 100%.  10/02/15 EKG: SB at 56 bpm. Baseline wanderer.  04/23/13 Nuclear stress test: Overall Impression: Low risk stress nuclear study no ischemia. LV Ejection Fraction: 61%. LV Wall Motion: NL LV Function; NL Wall Motion Upper normal LV chamber size (146ml). Sensitivity and specificity of study reduced by noted attenuation however this did not seem to greatly affect study interpretation. (Ordered following 03/13/13 ETT that was abnormal.)  01/01/13 Echo: Study Conclusions - Left ventricle: Wall thickness was increased in a pattern of mild LVH. The estimated ejection fraction was 60%. Wall motion was normal; there were no regional wall motion abnormalities. - Left atrium: The atrium was mildly dilated. - Right ventricle: The cavity size was normal. Systolic function was normal. - Right atrium: The atrium was mildly dilated.  01/03/13-01/23/13 Event Monitor: 39-123 bpm. Average HR 56 bpm. SR with PACs.  Preoperative labs noted.    Patient with history of palpitations felt likely  related to PAC's. Cardiology work-up in 2014-2015 as outlined above. No additional testing ordered at that time. He denied SOB, fever, cough, CP at PAT. Vitals good, although mildly bradycardic (which is consistent with his average HR on event monitor in 2014). If no acute changes then I anticipate that he can proceed as planned.  George Hugh [facility] Short Stay Center/Anesthesiology Phone [phone] 10/07/2015 12:43 PM

## 2015-10-07 ENCOUNTER — Encounter: Payer: Self-pay | Admitting: Family Medicine

## 2015-10-07 NOTE — Telephone Encounter (Signed)
Patient would like to see Doctor C. Mammie Lorenzo?

## 2015-10-09 ENCOUNTER — Encounter: Payer: Self-pay | Admitting: Family Medicine

## 2015-10-09 ENCOUNTER — Ambulatory Visit (INDEPENDENT_AMBULATORY_CARE_PROVIDER_SITE_OTHER): Payer: Medicare Other | Admitting: Family Medicine

## 2015-10-09 VITALS — BP 123/76 | HR 59 | Temp 98.9°F | Ht 69.0 in | Wt 208.0 lb

## 2015-10-09 DIAGNOSIS — G4733 Obstructive sleep apnea (adult) (pediatric): Secondary | ICD-10-CM

## 2015-10-09 DIAGNOSIS — Z01818 Encounter for other preprocedural examination: Secondary | ICD-10-CM

## 2015-10-09 DIAGNOSIS — G4730 Sleep apnea, unspecified: Secondary | ICD-10-CM | POA: Diagnosis not present

## 2015-10-09 MED ORDER — NONFORMULARY OR COMPOUNDED ITEM: Status: DC

## 2015-10-09 NOTE — Progress Notes (Signed)
Pre visit review using our clinic review tool, if applicable. No additional management support is needed unless otherwise documented below in the visit note. 

## 2015-10-09 NOTE — Progress Notes (Signed)
Subjective:    Eric [name] is a 70 y.o. male who presents to the office today for a preoperative consultation at the request of surgeon DR Mardelle Matte who plans on performing L shoulder replacement on July 25. This consultation is requested for the specific conditions prompting preoperative evaluation (i.e. because of potential affect on operative risk): age, . Planned anesthesia: general. The patient has the following known anesthesia issues: none. Patients bleeding risk: no recent abnormal bleeding. Patient does not have objections to receiving blood products if needed.  The following portions of the patient's history were reviewed and updated as appropriate:  He  has a past medical history of Internal hemorrhoids without mention of complication; Osteoarthrosis, unspecified whether generalized or localized, unspecified site; GERD (gastroesophageal reflux disease); Hyperlipemia; Colon polyps; Diverticulosis of colon; Sleep apnea; cardiovascular stress test; History of hay fever; Seasonal allergies; Dysrhythmia; and Recent weight loss. He  does not have any pertinent problems on file. He  has past surgical history that includes Shoulder surgery (2009); Tonsillectomy; and Colonoscopy. His family history includes Arthritis in his mother; Cancer (age of onset: 79) in his sister; Cancer (age of onset: 80) in his mother; Cancer (age of onset: 31) in his brother; Colon polyps (age of onset: 65) in his mother; Heart disease in his father; Hyperlipidemia in his father and mother; Hypertension in his father. There is no history of Colon cancer. He  reports that he has never smoked. He has never used smokeless tobacco. He reports that he drinks alcohol. He reports that he does not use illicit drugs. He has a current medication list which includes the following prescription(s): aspirin, atorvastatin, coenzyme q-10, esomeprazole, fluticasone, loratadine, meloxicam, multiple vitamins-minerals, multivitamin,  tamsulosin, NONFORMULARY OR COMPOUNDED ITEM, pantoprazole, trimethobenzamide, and valsartan. Current Outpatient Prescriptions on File Prior to Visit  Medication Sig Dispense Refill  . aspirin 81 MG EC tablet Take 81 mg by mouth daily.      [name] atorvastatin (LIPITOR) 40 MG tablet TAKE 1 TABLET DAILY (Patient taking differently: TAKE 1/2 TABLET DAILY) 90 tablet 1  . Coenzyme Q-10 200 MG CAPS Take 1 each by mouth daily.    [name] esomeprazole (NEXIUM) 40 MG capsule Take 40 mg by mouth daily at 12 noon.    . fluticasone (FLONASE) 50 MCG/ACT nasal spray Place 1 spray into both nostrils daily as needed for allergies or rhinitis.    [name] loratadine (CLARITIN) 10 MG tablet Take 10 mg by mouth daily.    . meloxicam (MOBIC) 15 MG tablet TAKE 1 TABLET DAILY 90 tablet 1  . Multiple Vitamin (MULTIVITAMIN PO) Take 1 tablet by mouth daily.     . Multiple Vitamin (MULTIVITAMIN) tablet Take 1 tablet by mouth daily.    . tamsulosin (FLOMAX) 0.4 MG CAPS capsule Take 1 capsule (0.4 mg total) by mouth daily. 90 capsule 3  . pantoprazole (PROTONIX) 40 MG tablet Take 1 tablet (40 mg total) by mouth daily. (Patient not taking: Reported on 09/30/2015) 90 tablet 3  . trimethobenzamide (TIGAN) 300 MG capsule Take 1 capsule (300 mg total) by mouth as needed. (Patient not taking: Reported on 10/09/2015) 20 capsule 0  . valsartan (DIOVAN) 80 MG tablet Take 1 tablet (80 mg total) by mouth daily. (Patient not taking: Reported on 09/30/2015) 90 tablet 1   No current facility-administered medications on file prior to visit.   He is allergic to lisinopril..  Review of Systems Pertinent items are noted in HPI.    Objective:    BP 123/76  mmHg  Pulse 59  Temp(Src) 98.9 F (37.2 C) (Oral)  Ht 5\' 9"  (1.753 m)  Wt 208 lb (94.348 kg)  BMI 30.70 kg/m2  SpO2 96%   BP 123/76 mmHg  Pulse 59  Temp(Src) 98.9 F (37.2 C) (Oral)  Ht 5\' 9"  (1.753 m)  Wt 208 lb (94.348 kg)  BMI 30.70 kg/m2  SpO2 96% General appearance: alert,  cooperative, appears stated age and no distress Head: Normocephalic, without obvious abnormality, atraumatic Eyes: conjunctivae/corneas clear. PERRL, EOM's intact. Fundi benign. Ears: normal TM's and external ear canals both ears Nose: Nares normal. Septum midline. Mucosa normal. No drainage or sinus tenderness. Throat: lips, mucosa, and tongue normal; teeth and gums normal Neck: no adenopathy, no carotid bruit, no JVD, supple, symmetrical, trachea midline and thyroid not enlarged, symmetric, no tenderness/mass/nodules Back: symmetric, no curvature. ROM normal. No CVA tenderness. Lungs: clear to auscultation bilaterally Chest wall: no tenderness Heart: regular rate and rhythm, S1, S2 normal, no murmur, click, rub or gallop Abdomen: soft, non-tender; bowel sounds normal; no masses,  no organomegaly Extremities: extremities normal, atraumatic, no cyanosis or edema Pulses: 2+ and symmetric Skin: Skin color, texture, turgor normal. No rashes or lesions Lymph nodes: Cervical, supraclavicular, and axillary nodes normal. Neurologic: Alert and oriented X 3, normal strength and tone. Normal symmetric reflexes. Normal coordination and gait Predictors of intubation difficulty:  Morbid obesity? no  Anatomically abnormal facies? no  Prominent incisors? no  Receding mandible? no  Short, thick neck? no  Neck range of motion: normal  Cardiographics ECG: normal sinus rhythm, no blocks or conduction defects, no ischemic changes Echocardiogram: not done  Imaging Chest x-ray: not done   Lab Review  Lab on 10/03/2015  Component Date Value  . T4, Total 10/03/2015 6.9   . T3 Uptake 10/03/2015 33   . Free Thyroxine Index 10/03/2015 2.3   . TSH 10/03/2015 0.62   Hospital Outpatient Visit on 10/02/2015  Component Date Value  . MRSA, PCR 10/02/2015 NEGATIVE   . Staphylococcus aureus 10/02/2015 NEGATIVE   . Sodium 10/02/2015 140   . Potassium 10/02/2015 3.9   . Chloride 10/02/2015 105   . CO2  10/02/2015 27   . Glucose, Bld 10/02/2015 95   . BUN 10/02/2015 14   . Creatinine, Ser 10/02/2015 0.92   . Calcium 10/02/2015 9.2   . GFR calc non Af Amer 10/02/2015 >60   . GFR calc Af Amer 10/02/2015 >60   . Anion gap 10/02/2015 8   . WBC 10/02/2015 6.8   . RBC 10/02/2015 4.52   . Hemoglobin 10/02/2015 14.0   . HCT 10/02/2015 43.6   . MCV 10/02/2015 96.5   . [facility] 10/02/2015 31.0   . MCHC 10/02/2015 32.1   . RDW 10/02/2015 13.8   . Platelets 10/02/2015 158   Office Visit on 09/01/2015  Component Date Value  . Color, UA 09/01/2015 Yellow   . Clarity, UA 09/01/2015 Clear   . Glucose, UA 09/01/2015 Neg   . Bilirubin, UA 09/01/2015 Neg   . Ketones, UA 09/01/2015 Neg   . Spec Grav, UA 09/01/2015 1.015   . Blood, UA 09/01/2015 Neg   . pH, UA 09/01/2015 6.5   . Protein, UA 09/01/2015 Neg   . Urobilinogen, UA 09/01/2015 2.0   . Nitrite, UA 09/01/2015 Neg   . Leukocytes, UA 09/01/2015 Negative   . PSA 09/01/2015 0.79   . Cholesterol 09/01/2015 115   . Triglycerides 09/01/2015 53.0   . HDL 09/01/2015 42.80   . VLDL  09/01/2015 10.6   . LDL Cholesterol 09/01/2015 62   . Total CHOL/HDL Ratio 09/01/2015 3   . NonHDL 09/01/2015 72.62   . WBC 09/01/2015 6.5   . RBC 09/01/2015 4.42   . Hemoglobin 09/01/2015 13.6   . HCT 09/01/2015 41.0   . MCV 09/01/2015 92.8   . MCHC 09/01/2015 33.3   . RDW 09/01/2015 14.1   . Platelets 09/01/2015 156.0   . Neutrophils Relative % 09/01/2015 65.7   . Lymphocytes Relative 09/01/2015 24.9   . Monocytes Relative 09/01/2015 8.5   . Eosinophils Relative 09/01/2015 0.6   . Basophils Relative 09/01/2015 0.3   . Neutro Abs 09/01/2015 4.3   . Lymphs Abs 09/01/2015 1.6   . Monocytes Absolute 09/01/2015 0.6   . Eosinophils Absolute 09/01/2015 0.0   . Basophils Absolute 09/01/2015 0.0   . Sodium 09/01/2015 142   . Potassium 09/01/2015 4.1   . Chloride 09/01/2015 105   . CO2 09/01/2015 28   . Glucose, Bld 09/01/2015 87   . BUN 09/01/2015 16   .  Creatinine, Ser 09/01/2015 0.81   . Total Bilirubin 09/01/2015 0.8   . Alkaline Phosphatase 09/01/2015 62   . AST 09/01/2015 13   . ALT 09/01/2015 15   . Total Protein 09/01/2015 6.5   . Albumin 09/01/2015 4.3   . Calcium 09/01/2015 9.4   . GFR 09/01/2015 100.32       Assessment:      70 y.o. male with planned surgery as above.   Known risk factors for perioperative complications: None   Difficulty with intubation is not anticipated.  Cardiac Risk Estimation: low     Plan:    1. Preoperative workup as follows ECG, labs etc per surgical team. 2. Change in medication regimen before surgery: pt has already been given instructions . 3. Deep vein thrombosis prophylaxis postoperatively:regimen to be chosen by surgical team. 6. Surveillance for postoperative MI with ECG immediately postoperatively and on postoperative days 1 and 2 AND troponin levels 24 hours postoperatively and on day 4 or hospital discharge (whichever comes first): at the discretion of anesthesiologist. 7. Other measures: consult [facility] if needed

## 2015-10-10 ENCOUNTER — Telehealth: Payer: Self-pay

## 2015-10-10 NOTE — Telephone Encounter (Signed)
Please change order  

## 2015-10-10 NOTE — Telephone Encounter (Signed)
Tiffany with [employer] called to say if patient needs new CPAP machine they will have to have order that reads: CPAP and supplies. Will need pressure level, office notes with diagnosis. Says they also need demographics and insurance information.

## 2015-10-13 MED ORDER — CEFAZOLIN SODIUM-DEXTROSE 2-4 GM/100ML-% IV SOLN
2.0000 g | INTRAVENOUS | Status: AC
  Administered 2015-10-14: 2 g via INTRAVENOUS
  Filled 2015-10-13: qty 100

## 2015-10-13 NOTE — Anesthesia Preprocedure Evaluation (Signed)
Anesthesia Evaluation  Patient identified by MRN, date of birth, ID band Patient awake    Reviewed: Allergy & Precautions, NPO status , Patient's Chart, lab work & pertinent test results  History of Anesthesia Complications Negative for: history of anesthetic complications  Airway Mallampati: II  TM Distance: >3 FB Neck ROM: Full    Dental  (+) Dental Advisory Given   Pulmonary sleep apnea and Continuous Positive Airway Pressure Ventilation ,    breath sounds clear to auscultation       Cardiovascular hypertension, Pt. on medications (-) angina Rhythm:Regular Rate:Normal  '15 Myoview: EF 61%, no ischemia '14 ECHO: EF 60%, valves OK   Neuro/Psych negative neurological ROS  negative psych ROS   GI/Hepatic Neg liver ROS, GERD  Medicated and Controlled,  Endo/Other  Morbid obesity  Renal/GU negative Renal ROS     Musculoskeletal  (+) Arthritis , Osteoarthritis,    Abdominal (+) + obese,   Peds  Hematology negative hematology ROS (+)   Anesthesia Other Findings   Reproductive/Obstetrics                            Anesthesia Physical Anesthesia Plan  ASA: III  Anesthesia Plan: General   Post-op Pain Management: GA combined w/ Regional for post-op pain   Induction: Intravenous  Airway Management Planned: Oral ETT  Additional Equipment:   Intra-op Plan:   Post-operative Plan: Extubation in OR  Informed Consent: I have reviewed the patients History and Physical, chart, labs and discussed the procedure including the risks, benefits and alternatives for the proposed anesthesia with the patient or authorized representative who has indicated his/her understanding and acceptance.   Dental advisory given  Plan Discussed with: CRNA and Surgeon  Anesthesia Plan Comments: (Plan routine monitors, GETA with interscalene block for post op analgesia)        Anesthesia Quick  Evaluation

## 2015-10-14 ENCOUNTER — Encounter (HOSPITAL_COMMUNITY): Payer: Self-pay | Admitting: *Deleted

## 2015-10-14 ENCOUNTER — Inpatient Hospital Stay (HOSPITAL_COMMUNITY)
Admission: RE | Admit: 2015-10-14 | Discharge: 2015-10-15 | DRG: 483 | Disposition: A | Discharge status: Home / Self Care | Payer: Medicare Other | Source: Ambulatory Visit | Attending: Orthopedic Surgery | Admitting: Orthopedic Surgery

## 2015-10-14 ENCOUNTER — Inpatient Hospital Stay (HOSPITAL_COMMUNITY): Payer: Medicare Other

## 2015-10-14 ENCOUNTER — Inpatient Hospital Stay (HOSPITAL_COMMUNITY): Payer: Medicare Other | Admitting: Emergency Medicine

## 2015-10-14 ENCOUNTER — Encounter (HOSPITAL_COMMUNITY)
Admission: RE | Disposition: A | Discharge status: Home / Self Care | Payer: Self-pay | Source: Ambulatory Visit | Attending: Orthopedic Surgery

## 2015-10-14 ENCOUNTER — Inpatient Hospital Stay (HOSPITAL_COMMUNITY): Payer: Medicare Other | Admitting: Anesthesiology

## 2015-10-14 DIAGNOSIS — Z96619 Presence of unspecified artificial shoulder joint: Secondary | ICD-10-CM

## 2015-10-14 DIAGNOSIS — Z96612 Presence of left artificial shoulder joint: Secondary | ICD-10-CM

## 2015-10-14 DIAGNOSIS — G4730 Sleep apnea, unspecified: Secondary | ICD-10-CM | POA: Diagnosis not present

## 2015-10-14 DIAGNOSIS — Z79899 Other long term (current) drug therapy: Secondary | ICD-10-CM

## 2015-10-14 DIAGNOSIS — K219 Gastro-esophageal reflux disease without esophagitis: Secondary | ICD-10-CM | POA: Diagnosis present

## 2015-10-14 DIAGNOSIS — Z7982 Long term (current) use of aspirin: Secondary | ICD-10-CM | POA: Diagnosis not present

## 2015-10-14 DIAGNOSIS — G8918 Other acute postprocedural pain: Secondary | ICD-10-CM | POA: Diagnosis not present

## 2015-10-14 DIAGNOSIS — M19012 Primary osteoarthritis, left shoulder: Secondary | ICD-10-CM | POA: Diagnosis not present

## 2015-10-14 DIAGNOSIS — M25512 Pain in left shoulder: Secondary | ICD-10-CM | POA: Diagnosis not present

## 2015-10-14 DIAGNOSIS — Z471 Aftercare following joint replacement surgery: Secondary | ICD-10-CM | POA: Diagnosis not present

## 2015-10-14 DIAGNOSIS — I10 Essential (primary) hypertension: Secondary | ICD-10-CM | POA: Diagnosis not present

## 2015-10-14 DIAGNOSIS — E785 Hyperlipidemia, unspecified: Secondary | ICD-10-CM | POA: Diagnosis present

## 2015-10-14 DIAGNOSIS — M19112 Post-traumatic osteoarthritis, left shoulder: Principal | ICD-10-CM | POA: Diagnosis present

## 2015-10-14 DIAGNOSIS — Z6830 Body mass index (BMI) 30.0-30.9, adult: Secondary | ICD-10-CM | POA: Diagnosis not present

## 2015-10-14 HISTORY — DX: Post-traumatic osteoarthritis, left shoulder: M19.112

## 2015-10-14 HISTORY — PX: TOTAL SHOULDER ARTHROPLASTY: SHX126

## 2015-10-14 SURGERY — ARTHROPLASTY, SHOULDER, TOTAL
Anesthesia: General | Site: Shoulder | Laterality: Left

## 2015-10-14 MED ORDER — MULTIVITAMIN PO LIQD
Freq: Every day | ORAL | Status: DC
Start: 2015-10-14 — End: 2015-10-14

## 2015-10-14 MED ORDER — ONDANSETRON HCL 4 MG PO TABS: 4.0000 mg | ORAL_TABLET | Freq: Three times a day (TID) | ORAL | 0 refills | Status: DC | PRN

## 2015-10-14 MED ORDER — SENNA-DOCUSATE SODIUM 8.6-50 MG PO TABS: 2.0000 | ORAL_TABLET | Freq: Every day | ORAL | 1 refills | Status: DC

## 2015-10-14 MED ORDER — MAGNESIUM CITRATE PO SOLN: 1.0000 | Freq: Once | ORAL | Status: DC | PRN

## 2015-10-14 MED ORDER — SUGAMMADEX SODIUM 200 MG/2ML IV SOLN
INTRAVENOUS | Status: AC
  Filled 2015-10-14: qty 2

## 2015-10-14 MED ORDER — ONDANSETRON HCL 4 MG/2ML IJ SOLN
INTRAMUSCULAR | Status: DC | PRN
Start: 2015-10-14 — End: 2015-10-14
  Administered 2015-10-14: 4 mg via INTRAVENOUS

## 2015-10-14 MED ORDER — ROCURONIUM BROMIDE 50 MG/5ML IV SOLN
INTRAVENOUS | Status: AC
  Filled 2015-10-14: qty 1

## 2015-10-14 MED ORDER — PROPOFOL 10 MG/ML IV BOLUS
INTRAVENOUS | Status: AC
  Filled 2015-10-14: qty 20

## 2015-10-14 MED ORDER — COENZYME Q-10 200 MG PO CAPS: 1.0000 | ORAL_CAPSULE | Freq: Every day | ORAL | Status: DC

## 2015-10-14 MED ORDER — ALUM & MAG HYDROXIDE-SIMETH 200-200-20 MG/5ML PO SUSP: 30.0000 mL | ORAL | Status: DC | PRN

## 2015-10-14 MED ORDER — PHENOL 1.4 % MT LIQD: 1.0000 | OROMUCOSAL | Status: DC | PRN

## 2015-10-14 MED ORDER — MIDAZOLAM HCL 2 MG/2ML IJ SOLN
INTRAMUSCULAR | Status: AC
  Filled 2015-10-14: qty 2

## 2015-10-14 MED ORDER — LIDOCAINE 2% (20 MG/ML) 5 ML SYRINGE
INTRAMUSCULAR | Status: AC
  Filled 2015-10-14: qty 5

## 2015-10-14 MED ORDER — ACETAMINOPHEN 650 MG RE SUPP: 650.0000 mg | Freq: Four times a day (QID) | RECTAL | Status: DC | PRN

## 2015-10-14 MED ORDER — METHOCARBAMOL 500 MG PO TABS: 500.0000 mg | ORAL_TABLET | Freq: Four times a day (QID) | ORAL | Status: DC | PRN

## 2015-10-14 MED ORDER — PHENYLEPHRINE HCL 10 MG/ML IJ SOLN
INTRAMUSCULAR | Status: DC | PRN
  Administered 2015-10-14: 80 ug via INTRAVENOUS

## 2015-10-14 MED ORDER — HYDROCODONE-ACETAMINOPHEN 10-325 MG PO TABS: 1.0000 | ORAL_TABLET | Freq: Four times a day (QID) | ORAL | 0 refills | Status: DC | PRN

## 2015-10-14 MED ORDER — FENTANYL CITRATE (PF) 250 MCG/5ML IJ SOLN
INTRAMUSCULAR | Status: AC
  Filled 2015-10-14: qty 20

## 2015-10-14 MED ORDER — DOCUSATE SODIUM 100 MG PO CAPS
100.0000 mg | ORAL_CAPSULE | Freq: Two times a day (BID) | ORAL | Status: DC
  Administered 2015-10-14 – 2015-10-15 (×3): 100 mg via ORAL
  Filled 2015-10-14 (×3): qty 1

## 2015-10-14 MED ORDER — FENTANYL CITRATE (PF) 100 MCG/2ML IJ SOLN
INTRAMUSCULAR | Status: DC | PRN
  Administered 2015-10-14: 50 ug via INTRAVENOUS
  Administered 2015-10-14 (×2): 25 ug via INTRAVENOUS
  Administered 2015-10-14: 50 ug via INTRAVENOUS
  Administered 2015-10-14: 100 ug via INTRAVENOUS

## 2015-10-14 MED ORDER — MEPERIDINE HCL 25 MG/ML IJ SOLN: 6.2500 mg | INTRAMUSCULAR | Status: DC | PRN

## 2015-10-14 MED ORDER — PROPOFOL 10 MG/ML IV BOLUS
INTRAVENOUS | Status: DC | PRN
  Administered 2015-10-14: 200 mg via INTRAVENOUS

## 2015-10-14 MED ORDER — TRIMETHOBENZAMIDE HCL 300 MG PO CAPS
300.0000 mg | ORAL_CAPSULE | ORAL | Status: DC | PRN
Start: 2015-10-14 — End: 2015-10-15
  Filled 2015-10-14: qty 1

## 2015-10-14 MED ORDER — TAMSULOSIN HCL 0.4 MG PO CAPS
0.4000 mg | ORAL_CAPSULE | Freq: Every day | ORAL | Status: DC
  Administered 2015-10-14: 0.4 mg via ORAL

## 2015-10-14 MED ORDER — METHOCARBAMOL 1000 MG/10ML IJ SOLN
500.0000 mg | Freq: Four times a day (QID) | INTRAVENOUS | Status: DC | PRN
  Filled 2015-10-14: qty 5

## 2015-10-14 MED ORDER — 0.9 % SODIUM CHLORIDE (POUR BTL) OPTIME
TOPICAL | Status: DC | PRN
  Administered 2015-10-14: 1000 mL

## 2015-10-14 MED ORDER — ASPIRIN EC 81 MG PO TBEC
81.0000 mg | DELAYED_RELEASE_TABLET | Freq: Every day | ORAL | Status: DC
  Filled 2015-10-14 (×2): qty 1

## 2015-10-14 MED ORDER — PROMETHAZINE HCL 25 MG/ML IJ SOLN: 6.2500 mg | INTRAMUSCULAR | Status: DC | PRN

## 2015-10-14 MED ORDER — LORATADINE 10 MG PO TABS
10.0000 mg | ORAL_TABLET | Freq: Every day | ORAL | Status: DC
  Filled 2015-10-14 (×2): qty 1

## 2015-10-14 MED ORDER — HYDROMORPHONE HCL 1 MG/ML IJ SOLN
0.5000 mg | INTRAMUSCULAR | Status: DC | PRN
  Administered 2015-10-14: 0.5 mg via INTRAVENOUS
  Filled 2015-10-14: qty 1

## 2015-10-14 MED ORDER — LIDOCAINE HCL (CARDIAC) 20 MG/ML IV SOLN
INTRAVENOUS | Status: DC | PRN
  Administered 2015-10-14: 50 mg via INTRAVENOUS

## 2015-10-14 MED ORDER — BISACODYL 10 MG RE SUPP: 10.0000 mg | Freq: Every day | RECTAL | Status: DC | PRN

## 2015-10-14 MED ORDER — PANTOPRAZOLE SODIUM 40 MG PO TBEC: 40.0000 mg | DELAYED_RELEASE_TABLET | Freq: Every day | ORAL | Status: DC

## 2015-10-14 MED ORDER — ACETAMINOPHEN 325 MG PO TABS: 650.0000 mg | ORAL_TABLET | Freq: Four times a day (QID) | ORAL | Status: DC | PRN

## 2015-10-14 MED ORDER — ATORVASTATIN CALCIUM 40 MG PO TABS
40.0000 mg | ORAL_TABLET | Freq: Every day | ORAL | Status: DC
  Administered 2015-10-14: 40 mg via ORAL
  Filled 2015-10-14: qty 1

## 2015-10-14 MED ORDER — ROCURONIUM BROMIDE 100 MG/10ML IV SOLN
INTRAVENOUS | Status: DC | PRN
  Administered 2015-10-14: 10 mg via INTRAVENOUS
  Administered 2015-10-14: 50 mg via INTRAVENOUS
  Administered 2015-10-14: 20 mg via INTRAVENOUS

## 2015-10-14 MED ORDER — HYDROMORPHONE HCL 1 MG/ML IJ SOLN: 0.2500 mg | INTRAMUSCULAR | Status: DC | PRN

## 2015-10-14 MED ORDER — FLUTICASONE PROPIONATE 50 MCG/ACT NA SUSP
1.0000 | Freq: Every day | NASAL | Status: DC | PRN
  Filled 2015-10-14: qty 16

## 2015-10-14 MED ORDER — POLYETHYLENE GLYCOL 3350 17 G PO PACK: 17.0000 g | PACK | Freq: Every day | ORAL | Status: DC | PRN

## 2015-10-14 MED ORDER — ONDANSETRON HCL 4 MG/2ML IJ SOLN: 4.0000 mg | Freq: Four times a day (QID) | INTRAMUSCULAR | Status: DC | PRN

## 2015-10-14 MED ORDER — ONDANSETRON HCL 4 MG PO TABS: 4.0000 mg | ORAL_TABLET | Freq: Four times a day (QID) | ORAL | Status: DC | PRN

## 2015-10-14 MED ORDER — MIDAZOLAM HCL 2 MG/2ML IJ SOLN: 0.5000 mg | Freq: Once | INTRAMUSCULAR | Status: DC | PRN

## 2015-10-14 MED ORDER — ADULT MULTIVITAMIN W/MINERALS CH
1.0000 | ORAL_TABLET | Freq: Every day | ORAL | Status: DC
  Administered 2015-10-14: 1 via ORAL
  Filled 2015-10-14 (×3): qty 1

## 2015-10-14 MED ORDER — MENTHOL 3 MG MT LOZG: 1.0000 | LOZENGE | OROMUCOSAL | Status: DC | PRN

## 2015-10-14 MED ORDER — MIDAZOLAM HCL 5 MG/5ML IJ SOLN
INTRAMUSCULAR | Status: DC | PRN
  Administered 2015-10-14 (×2): 1 mg via INTRAVENOUS

## 2015-10-14 MED ORDER — PANTOPRAZOLE SODIUM 40 MG PO TBEC: 80.0000 mg | DELAYED_RELEASE_TABLET | Freq: Every day | ORAL | Status: DC

## 2015-10-14 MED ORDER — LACTATED RINGERS IV SOLN
INTRAVENOUS | Status: DC | PRN
  Administered 2015-10-14 (×2): via INTRAVENOUS

## 2015-10-14 MED ORDER — TAMSULOSIN HCL 0.4 MG PO CAPS
0.4000 mg | ORAL_CAPSULE | Freq: Every day | ORAL | Status: DC
  Administered 2015-10-14: 0.4 mg via ORAL
  Filled 2015-10-14 (×2): qty 1

## 2015-10-14 MED ORDER — DIPHENHYDRAMINE HCL 12.5 MG/5ML PO ELIX: 12.5000 mg | ORAL_SOLUTION | ORAL | Status: DC | PRN

## 2015-10-14 MED ORDER — SUGAMMADEX SODIUM 200 MG/2ML IV SOLN
INTRAVENOUS | Status: DC | PRN
  Administered 2015-10-14: 200 mg via INTRAVENOUS

## 2015-10-14 MED ORDER — POTASSIUM CHLORIDE IN NACL 20-0.45 MEQ/L-% IV SOLN
INTRAVENOUS | Status: DC
  Administered 2015-10-14: 14:00:00 via INTRAVENOUS
  Filled 2015-10-14 (×2): qty 1000

## 2015-10-14 MED ORDER — SENNA 8.6 MG PO TABS
1.0000 | ORAL_TABLET | Freq: Two times a day (BID) | ORAL | Status: DC
  Administered 2015-10-14 – 2015-10-15 (×3): 8.6 mg via ORAL
  Filled 2015-10-14 (×4): qty 1

## 2015-10-14 MED ORDER — METOCLOPRAMIDE HCL 5 MG/ML IJ SOLN: 5.0000 mg | Freq: Three times a day (TID) | INTRAMUSCULAR | Status: DC | PRN

## 2015-10-14 MED ORDER — CEFAZOLIN IN D5W 1 GM/50ML IV SOLN
1.0000 g | Freq: Four times a day (QID) | INTRAVENOUS | Status: AC
  Administered 2015-10-14 – 2015-10-15 (×3): 1 g via INTRAVENOUS
  Filled 2015-10-14 (×4): qty 50

## 2015-10-14 MED ORDER — PHENYLEPHRINE HCL 10 MG/ML IJ SOLN
INTRAVENOUS | Status: DC | PRN
  Administered 2015-10-14: 20 ug/min via INTRAVENOUS

## 2015-10-14 MED ORDER — BUPIVACAINE-EPINEPHRINE (PF) 0.5% -1:200000 IJ SOLN
INTRAMUSCULAR | Status: DC | PRN
  Administered 2015-10-14: 30 mL via PERINEURAL

## 2015-10-14 MED ORDER — BACLOFEN 10 MG PO TABS: 10.0000 mg | ORAL_TABLET | Freq: Three times a day (TID) | ORAL | 0 refills | Status: DC

## 2015-10-14 MED ORDER — ONDANSETRON HCL 4 MG/2ML IJ SOLN
INTRAMUSCULAR | Status: AC
  Filled 2015-10-14: qty 2

## 2015-10-14 MED ORDER — METOCLOPRAMIDE HCL 5 MG PO TABS
5.0000 mg | ORAL_TABLET | Freq: Three times a day (TID) | ORAL | Status: DC | PRN
  Administered 2015-10-15: 10 mg via ORAL
  Filled 2015-10-14: qty 2

## 2015-10-14 MED ORDER — HYDROCODONE-ACETAMINOPHEN 10-325 MG PO TABS
1.0000 | ORAL_TABLET | ORAL | Status: DC | PRN
  Administered 2015-10-14 – 2015-10-15 (×4): 2 via ORAL
  Filled 2015-10-14 (×4): qty 2

## 2015-10-14 SURGICAL SUPPLY — 62 items
BIT DRILL 5/64X5 DISP (BIT) ×2 IMPLANT
BLADE SAW SGTL MED 73X18.5 STR (BLADE) ×2 IMPLANT
CAPT SHLDR TOTAL 2 ×2 IMPLANT
CEMENT BONE DEPUY (Cement) ×2 IMPLANT
CLSR STERI-STRIP ANTIMIC 1/2X4 (GAUZE/BANDAGES/DRESSINGS) ×2 IMPLANT
COVER SURGICAL LIGHT HANDLE (MISCELLANEOUS) ×2 IMPLANT
DRAPE ORTHO SPLIT 77X108 STRL (DRAPES) ×2
DRAPE PROXIMA HALF (DRAPES) ×2 IMPLANT
DRAPE SURG ORHT 6 SPLT 77X108 (DRAPES) ×2 IMPLANT
DRAPE U-SHAPE 47X51 STRL (DRAPES) ×2 IMPLANT
DRSG MEPILEX BORDER 4X8 (GAUZE/BANDAGES/DRESSINGS) ×2 IMPLANT
DURAPREP 26ML APPLICATOR (WOUND CARE) ×4 IMPLANT
ELECT REM PT RETURN 9FT ADLT (ELECTROSURGICAL) ×2
ELECTRODE REM PT RTRN 9FT ADLT (ELECTROSURGICAL) ×1 IMPLANT
EVACUATOR 1/8 PVC DRAIN (DRAIN) IMPLANT
GLOVE BIOGEL PI IND STRL 8 (GLOVE) ×1 IMPLANT
GLOVE BIOGEL PI INDICATOR 8 (GLOVE) ×1
GLOVE BIOGEL PI ORTHO PRO SZ8 (GLOVE) ×1
GLOVE ORTHO TXT STRL SZ7.5 (GLOVE) ×2 IMPLANT
GLOVE PI ORTHO PRO STRL SZ8 (GLOVE) ×1 IMPLANT
GLOVE SURG ORTHO 8.0 STRL STRW (GLOVE) ×4 IMPLANT
GOWN STRL REUS W/ TWL LRG LVL3 (GOWN DISPOSABLE) ×1 IMPLANT
GOWN STRL REUS W/ TWL XL LVL3 (GOWN DISPOSABLE) ×1 IMPLANT
GOWN STRL REUS W/TWL 2XL LVL3 (GOWN DISPOSABLE) ×2 IMPLANT
GOWN STRL REUS W/TWL LRG LVL3 (GOWN DISPOSABLE) ×1
GOWN STRL REUS W/TWL XL LVL3 (GOWN DISPOSABLE) ×1
HANDPIECE INTERPULSE COAX TIP (DISPOSABLE)
HOOD PEEL AWAY FACE SHEILD DIS (HOOD) ×6 IMPLANT
KIT BASIN OR (CUSTOM PROCEDURE TRAY) ×2 IMPLANT
KIT ROOM TURNOVER OR (KITS) ×2 IMPLANT
MANIFOLD NEPTUNE II (INSTRUMENTS) ×2 IMPLANT
NEEDLE 1/2 CIR CATGUT .05X1.09 (NEEDLE) ×2 IMPLANT
NEEDLE HYPO 25GX1X1/2 BEV (NEEDLE) IMPLANT
NS IRRIG 1000ML POUR BTL (IV SOLUTION) ×2 IMPLANT
PACK SHOULDER (CUSTOM PROCEDURE TRAY) ×2 IMPLANT
PAD ARMBOARD 7.5X6 YLW CONV (MISCELLANEOUS) ×4 IMPLANT
PIN HUMERAL STMN 3.2MMX9IN (INSTRUMENTS) ×2 IMPLANT
PIN STEINMANN THREADED TIP (PIN) ×2 IMPLANT
SET HNDPC FAN SPRY TIP SCT (DISPOSABLE) IMPLANT
SLING ARM IMMOBILIZER LRG (SOFTGOODS) IMPLANT
SLING ARM IMMOBILIZER MED (SOFTGOODS) IMPLANT
SMARTMIX MINI TOWER (MISCELLANEOUS) ×2
SPONGE LAP 18X18 X RAY DECT (DISPOSABLE) ×2 IMPLANT
SUCTION FRAZIER HANDLE 10FR (MISCELLANEOUS) ×1
SUCTION TUBE FRAZIER 10FR DISP (MISCELLANEOUS) ×1 IMPLANT
SUPPORT WRAP ARM LG (MISCELLANEOUS) ×2 IMPLANT
SUT FIBERWIRE #2 38 REV NDL BL (SUTURE) ×12
SUT MAXBRAID (SUTURE) IMPLANT
SUT MNCRL AB 4-0 PS2 18 (SUTURE) IMPLANT
SUT VIC AB 0 CT1 27 (SUTURE) ×1
SUT VIC AB 0 CT1 27XBRD ANBCTR (SUTURE) ×1 IMPLANT
SUT VIC AB 2-0 CT1 27 (SUTURE) ×1
SUT VIC AB 2-0 CT1 TAPERPNT 27 (SUTURE) ×1 IMPLANT
SUT VIC AB 3-0 SH 8-18 (SUTURE) ×2 IMPLANT
SUTURE FIBERWR#2 38 REV NDL BL (SUTURE) ×6 IMPLANT
SYR CONTROL 10ML LL (SYRINGE) IMPLANT
TOWEL OR 17X24 6PK STRL BLUE (TOWEL DISPOSABLE) ×2 IMPLANT
TOWEL OR 17X26 10 PK STRL BLUE (TOWEL DISPOSABLE) ×2 IMPLANT
TOWER SMARTMIX MINI (MISCELLANEOUS) ×1 IMPLANT
TUBE CONNECTING 12X1/4 (SUCTIONS) ×2 IMPLANT
WATER STERILE IRR 1000ML POUR (IV SOLUTION) IMPLANT
YANKAUER SUCT BULB TIP NO VENT (SUCTIONS) ×2 IMPLANT

## 2015-10-14 NOTE — Anesthesia Procedure Notes (Addendum)
Anesthesia Regional Block:  Interscalene brachial plexus block  Pre-Anesthetic Checklist: ,, timeout performed, Correct Patient, Correct Site, Correct Laterality, Correct Procedure, Correct Position, site marked, Risks and benefits discussed,  Surgical consent,  Pre-op evaluation,  At surgeon's request and post-op pain management  Laterality: Left and Upper  Prep: chloraprep       Needles:   Needle Type: Stimulator Needle - 40     Needle Length: 5cm 5 cm Needle Gauge: 22 and 22 G    Additional Needles:  Procedures: nerve stimulator Interscalene brachial plexus block  Nerve Stimulator or Paresthesia:  Response: forearm twitch, 0.48 mA, 0.1 ms,   Additional Responses:   Narrative:  Start time: 10/14/2015 7:02 AM End time: 10/14/2015 7:08 AM Injection made incrementally with aspirations every 5 mL.  Performed by: Personally  Anesthesiologist: Glennon Mac, Lamonta Cypress  Additional Notes: Pt identified in Holding room.  Monitors applied. Working IV access confirmed. Sterile prep L neck.  #22ga PNS to forearm twitch at 0.65mA threshold.  30cc 0.5% Bupivacaine with 1:200k epi injected incrementally after negative test dose.  Patient asymptomatic, VSS, no heme aspirated, tolerated well.

## 2015-10-14 NOTE — Progress Notes (Signed)
Orthopedic Tech Progress Note Patient Details:  Eric [name] 01/02/1946 [id number]  Ortho Devices Type of Ortho Device: Shoulder immobilizer Ortho Device/Splint Interventions: Application   [place] Pink 10/14/2015, 12:08 PM

## 2015-10-14 NOTE — H&P (Signed)
PREOPERATIVE H&P  Chief Complaint: djd left shoulder  HPI: Eric [name] is a 70 y.o. male who presents for preoperative history and physical with a diagnosis of djd left shoulder. Symptoms are rated as moderate to severe, and have been worsening.  This is significantly impairing activities of daily living.  He has elected for surgical management. History of remote dislocations.    He has failed injections, activity modification, anti-inflammatories, and assistive devices.  Preoperative X-rays demonstrate end stage degenerative changes with osteophyte formation, loss of joint space, subchondral sclerosis.   Past Medical History:  Diagnosis Date  . Colon polyps   . Diverticulosis of colon   . Dysrhythmia    Palpations from caffiene  . GERD (gastroesophageal reflux disease)   . History of hay fever   . Hx of cardiovascular stress test    Lexiscan Myoview (04/2013):  No ischemia, EF 61%; low risk  . Hyperlipemia   . Internal hemorrhoids without mention of complication   . Osteoarthrosis, unspecified whether generalized or localized, unspecified site   . Recent weight loss    75lbs  . Seasonal allergies   . Sleep apnea    CPAP   Past Surgical History:  Procedure Laterality Date  . COLONOSCOPY    . SHOULDER SURGERY  07-03-2007   right  . TONSILLECTOMY     Social History   Social History  . Marital status: Married    Spouse name: N/A  . Number of children: 3  . Years of education: N/A   Occupational History  . Retired--[employer]    Social History Main Topics  . Smoking status: Never Smoker  . Smokeless tobacco: Never Used  . Alcohol use 0.0 oz/week     Comment: rare----12 a year  . Drug use: No  . Sexual activity: Yes    Partners: Female   Other Topics Concern  . None   Social History Narrative   Lives with wife and daughter and two grandchildren.    Family History  Problem Relation Age of Onset  . Arthritis Mother   . Cancer Mother 47    breast   . Hyperlipidemia Mother   . Colon polyps Mother 63  . Heart disease Father     CAD--passed away 2011/07/03 age 75  . Hyperlipidemia Father   . Hypertension Father   . Cancer Sister 37    breast  . Cancer Brother 27    prostate  . Breast cancer    . Prostate cancer    . Colon cancer Neg Hx    Allergies  Allergen Reactions  . Lisinopril Cough   Prior to Admission medications   Medication Sig Start Date End Date Taking? Authorizing Provider  aspirin 81 MG EC tablet Take 81 mg by mouth daily.     Yes Historical Provider, MD  atorvastatin (LIPITOR) 40 MG tablet TAKE 1 TABLET DAILY Patient taking differently: TAKE 1/2 TABLET DAILY 09/17/15  Yes Yvonne R Lowne Chase, DO  Coenzyme Q-10 200 MG CAPS Take 1 each by mouth daily.   Yes Historical Provider, MD  esomeprazole (NEXIUM) 40 MG capsule Take 40 mg by mouth daily at 12 noon.   Yes Historical Provider, MD  fluticasone (FLONASE) 50 MCG/ACT nasal spray Place 1 spray into both nostrils daily as needed for allergies or rhinitis.   Yes Historical Provider, MD  loratadine (CLARITIN) 10 MG tablet Take 10 mg by mouth daily.   Yes Historical Provider, MD  meloxicam (MOBIC) 15 MG tablet TAKE 1  TABLET DAILY 05/12/15  Yes Rosalita Chessman Chase, DO  Multiple Vitamin (MULTIVITAMIN PO) Take 1 tablet by mouth daily.    Yes Historical Provider, MD  Multiple Vitamin (MULTIVITAMIN) tablet Take 1 tablet by mouth daily.   Yes Historical Provider, MD  tamsulosin (FLOMAX) 0.4 MG CAPS capsule Take 1 capsule (0.4 mg total) by mouth daily. 09/01/15  Yes Rosalita Chessman Chase, DO  NONFORMULARY OR COMPOUNDED ITEM apap cpap maching--auto adjusting pressure  dx sleep apnea 10/09/15   Alferd Apa Lowne Chase, DO  pantoprazole (PROTONIX) 40 MG tablet Take 1 tablet (40 mg total) by mouth daily. Patient not taking: Reported on 09/30/2015 09/01/15   Rosalita Chessman Chase, DO  trimethobenzamide (TIGAN) 300 MG capsule Take 1 capsule (300 mg total) by mouth as needed. Patient not taking:  Reported on 10/09/2015 02/11/14   Alferd Apa Lowne Chase, DO  valsartan (DIOVAN) 80 MG tablet Take 1 tablet (80 mg total) by mouth daily. Patient not taking: Reported on 09/30/2015 09/01/15   Rosalita Chessman Chase, DO     Positive ROS: All other systems have been reviewed and were otherwise negative with the exception of those mentioned in the HPI and as above.  Physical Exam: General: Alert, no acute distress Cardiovascular: No pedal edema Respiratory: No cyanosis, no use of accessory musculature GI: No organomegaly, abdomen is soft and non-tender Skin: No lesions in the area of chief complaint Neurologic: Sensation intact distally Psychiatric: Patient is competent for consent with normal mood and affect Lymphatic: No axillary or cervical lymphadenopathy  MUSCULOSKELETAL: right shoulder AROM 0-110 with ER to 0 cuff intact.  Left shoulder AROM 0-80 degrees with crepitance and loss of ER to 0 at best.  Fair cuff strength.  Assessment: djd left shoulder   Plan: Plan for Procedure(s): LEFT TOTAL SHOULDER ARTHROPLASTY  The risks benefits and alternatives were discussed with the patient including but not limited to the risks of nonoperative treatment, versus surgical intervention including infection, bleeding, nerve injury,  blood clots, cardiopulmonary complications, morbidity, mortality, among others, and they were willing to proceed.   Johnny Bridge, MD Cell [phone]   10/14/2015 7:11 AM

## 2015-10-14 NOTE — Discharge Instructions (Signed)

## 2015-10-14 NOTE — Transfer of Care (Signed)
Immediate Anesthesia Transfer of Care Note  Patient: Lekendrick Dippold  Procedure(s) Performed: Procedure(s): LEFT TOTAL SHOULDER ARTHROPLASTY (Left)  Patient Location: PACU  Anesthesia Type:GA combined with regional for post-op pain  Level of Consciousness: awake, alert , oriented and patient cooperative  Airway & Oxygen Therapy: Patient Spontanous Breathing and Patient connected to nasal cannula oxygen  Post-op Assessment: Report given to RN and Post -op Vital signs reviewed and stable  Post vital signs: Reviewed and stable  Last Vitals:  Vitals:   10/14/15 0602 10/14/15 0954  BP: (!) 148/67 (P) 124/65  Pulse: (!) 50   Resp: 20   Temp: 36.7 C (P) 36.5 C    Last Pain:  Vitals:   10/14/15 0602  TempSrc: Oral  PainSc: 5          Complications: No apparent anesthesia complications

## 2015-10-14 NOTE — Anesthesia Procedure Notes (Signed)
Procedure Name: Intubation Date/Time: 10/14/2015 7:36 AM Performed by: Salli Quarry Jamylah Marinaccio Pre-anesthesia Checklist: Patient identified, Emergency Drugs available, Suction available and Patient being monitored Patient Re-evaluated:Patient Re-evaluated prior to inductionOxygen Delivery Method: Circle System Utilized Preoxygenation: Pre-oxygenation with 100% oxygen Intubation Type: IV induction Ventilation: Mask ventilation without difficulty Laryngoscope Size: Mac and 3 Grade View: Grade III Tube type: Oral Tube size: 7.5 mm Number of attempts: 1 Airway Equipment and Method: Stylet Placement Confirmation: ETT inserted through vocal cords under direct vision,  positive ETCO2 and breath sounds checked- equal and bilateral Secured at: 23 cm Tube secured with: Tape Dental Injury: Teeth and Oropharynx as per pre-operative assessment

## 2015-10-14 NOTE — Telephone Encounter (Signed)
Pt is currently admitted into the hospital for surgery.  It is likely that patient will need an appt.

## 2015-10-14 NOTE — Op Note (Addendum)
10/14/2015  9:26 AM  PATIENT:  Eric [name]    PRE-OPERATIVE DIAGNOSIS:  Left shoulder posttraumatic osteoarthrosis  POST-OPERATIVE DIAGNOSIS:  Same  PROCEDURE:  LEFT TOTAL SHOULDER ARTHROPLASTY  SURGEON:  Johnny Bridge, MD  PHYSICIAN ASSISTANT: Joya Gaskins, OPA-C, present and scrubbed throughout the case, critical for completion in a timely fashion, and for retraction, instrumentation, and closure.  ANESTHESIA:   General  PREOPERATIVE INDICATIONS:  Eric [name] is a  70 y.o. male with a diagnosis of left shoulder posttraumatic osteoarthrosis after multiple episodes of what sounds like dislocations self reduced, who failed conservative measures and elected for surgical management.    The risks benefits and alternatives were discussed with the patient preoperatively including but not limited to the risks of infection, bleeding, nerve injury, cardiopulmonary complications, the need for revision surgery, dislocation, loosening, incomplete relief of pain, among others, and the patient was willing to proceed.   OPERATIVE IMPLANTS: Biomet size 11 mini press-fit humeral stem, size 46+18 Versa-dial humeral head, set in the E position with increased coverage posteriorly, with a small cemented glenoid polyethylene 3 peg implant with a central regenerex noncemented post.   OPERATIVE FINDINGS: Advanced glenohumeral osteoarthritis involving the glenoid and the humeral head with substantial osteophyte formation inferiorly. The glenoid was extremely recessed with a fairly substantial amount of bone loss, although I was able to get a direct line with the central post, and in fact had containment within the vault with essentially all 3 pegs, although the central peg was bicortical just barely. The overall bone quality was relatively poor, and reaming was fairly soft. I was able to pass the FiberWire sutures through the lesser tuberosity without using any preparation.  The rotator cuff was  remarkably intact both superiorly, and anteriorly and posteriorly. The tendon may be somewhat thinned, but overall still intact.   OPERATIVE PROCEDURE: The patient was brought to the operating room and placed in the supine position. General anesthesia was administered. IV antibiotics were given.  The upper extremity was prepped and draped in usual sterile fashion. The patient was in a beachchair position with all bony prominences padded.   Time out was performed and a deltopectoral approach was carried out. The biceps tendon was tenodesed to the pectoralis tendon. The subscapularis was released, tagging it with a #2 FiberWire, leaving a cuff of tendon for repair.   The inferior osteophyte was removed, and release of the capsule off of the humeral side was completed. The head was dislocated, and I reamed sequentially. I placed the humeral cutting guide at 30 of retroversion, and then pinned this into place, and made my humeral neck cut. This was at the appropriate level.   I then placed deep retractors and exposed the glenoid. I excised the labrum circumferentially, taking care to protect the axillary nerve inferiorly. There was a fairly substantial anterior osteophytic rim that was labral calcification that I excised.  I then placed a guidewire into the center position, controlling appropriate version and inclination. I then reamed over the guidewire with the small reamer, and was satisfied with the preparation. I preserved the subchondral bone in order to maximize the strength and minimize the risk for subsequent subsidence.   I then drilled the central hole for the regenerex peg, and then placed the guide, and then drilled the 3 peripheral peg holes. I had excellent bony circumferential contact.   I then cleaned the glenoid, irrigated it copiously, and then dried it and cemented the prosthesis into place. Excellent seating was achieved. I  had full exposure. The cement cured, and then I turned my  attention to the humeral side.   I sequentially broached, up to the selected size, with the broach set at 30 of retroversion. I then placed the real stem. I trialed with multiple heads, and the above-named component was selected. Increased posterior coverage improved the coverage. The soft tissue tension was appropriate.   I then impacted the real humeral head into place, reduced the head, and irrigated copiously. Excellent stability and range of motion was achieved. I repaired the subscapularis with 4 #2 FiberWire, as well as the rotator interval, and irrigated copiously once more. The subcutaneous tissue was closed with Vicryl including the deltopectoral fascia.   The skin was closed with Steri-Strips and sterile gauze was applied. He had a preoperative nerve block. He tolerated the procedure well and there were no complications.

## 2015-10-15 ENCOUNTER — Encounter (HOSPITAL_COMMUNITY): Payer: Self-pay | Admitting: Orthopedic Surgery

## 2015-10-15 LAB — CBC
HCT: 38.6 % — ABNORMAL LOW (ref 39.0–52.0)
Hemoglobin: 12.7 g/dL — ABNORMAL LOW (ref 13.0–17.0)
MCH: 31.7 pg (ref 26.0–34.0)
MCHC: 32.9 g/dL (ref 30.0–36.0)
MCV: 96.3 fL (ref 78.0–100.0)
Platelets: 129 10*3/uL — ABNORMAL LOW (ref 150–400)
RBC: 4.01 MIL/uL — ABNORMAL LOW (ref 4.22–5.81)
RDW: 13.8 % (ref 11.5–15.5)
WBC: 8 10*3/uL (ref 4.0–10.5)

## 2015-10-15 LAB — BASIC METABOLIC PANEL
Anion gap: 8 (ref 5–15)
BUN: 12 mg/dL (ref 6–20)
CO2: 27 mmol/L (ref 22–32)
Calcium: 9 mg/dL (ref 8.9–10.3)
Chloride: 105 mmol/L (ref 101–111)
Creatinine, Ser: 0.9 mg/dL (ref 0.61–1.24)
GFR calc Af Amer: >60 mL/min (ref >60)
GFR calc non Af Amer: >60 mL/min (ref >60)
Glucose, Bld: 98 mg/dL (ref 65–99)
Potassium: 4.3 mmol/L (ref 3.5–5.1)
Sodium: 140 mmol/L (ref 135–145)

## 2015-10-15 NOTE — Care Management Important Message (Signed)
Important Message  Patient Details  Name: Eric [name] MRN: [id number] Date of Birth: November 23, 1945   Medicare Important Message Given:  Yes    Loann Quill 10/15/2015, 1:17 PM

## 2015-10-15 NOTE — Progress Notes (Signed)
PT Cancellation and Discharge Note  Patient Details Name: Eric [name] MRN: [id number] DOB: [birth year]   Cancelled Treatment:    Reason Eval/Treat Not Completed: PT screened, no needs identified, will sign off.  Spoke with OT who indicates pt is independent with mobility and that pt does not have any PT needs at this time.  Will sign off.     Catarina Hartshorn, [place] 10/15/2015, 11:50 AM

## 2015-10-15 NOTE — Evaluation (Signed)
Occupational Therapy Evaluation Patient Details Name: Eric [name] MRN: [id number] DOB: 08/17/1945 Today's Date: 10/15/2015    History of Present Illness L TSA   Clinical Impression   Pt at min A level with ADLs and Independent with mobility. Pt and his wife educated on sling wear, ADL techniques, positioning, L elbow/wrist/hand ROM exercises allowed at this time per MD. Written instructions left with pt and his wife. Pt's wife will be assisting him at home 24/7. All education completed and no further acute OT indicated at this time    Follow Up Recommendations  No OT follow up (progress rehab of L shoulder per MD when appropriate)    Equipment Recommendations  None recommended by OT    Recommendations for Other Services       Precautions / Restrictions Precautions Precautions: Shoulder Shoulder Interventions: Shoulder sling/immobilizer;Off for dressing/bathing/exercises Precaution Booklet Issued: Yes (comment) Precaution Comments: sling protocol education Required Braces or Orthoses: Sling Restrictions Weight Bearing Restrictions: Yes LUE Weight Bearing: Non weight bearing      Mobility Bed Mobility               General bed mobility comments: pt up in recliner  Transfers Overall transfer level: Independent                    Balance Overall balance assessment: No apparent balance deficits (not formally assessed)                                          ADL Overall ADL's : Needs assistance/impaired     Grooming: Wash/dry hands;Wash/dry face;Supervision/safety;Standing;With caregiver independent assisting   Upper Body Bathing: Minimal assitance;With caregiver independent assisting   Lower Body Bathing: Minimal assistance;With caregiver independent assisting   Upper Body Dressing : Minimal assistance;With caregiver independent assisting   Lower Body Dressing: Minimal assistance;With caregiver independent assisting   Toilet  Transfer: Independent;Ambulation   Toileting- Clothing Manipulation and Hygiene: Supervision/safety;With caregiver independent assisting   Tub/ Shower Transfer: 3 in Risk manager Details (indicate cue type and reason): pt has built in shower seat at home Functional mobility during ADLs: Independent General ADL Comments: Pt and wife educated on bathing and dressing techniques     Vision  no change from baseline              Pertinent Vitals/Pain Pain Assessment: 0-10 Pain Score: 6  Pain Location: R UE Pain Descriptors / Indicators: Burning Pain Intervention(s): Monitored during session;RN gave pain meds during session     Hand Dominance Right   Extremity/Trunk Assessment Upper Extremity Assessment Upper Extremity Assessment: LUE deficits/detail;Overall WFL for tasks assessed LUE Deficits / Details: L UE immobilized in sling LUE: Unable to fully assess due to immobilization       Cervical / Trunk Assessment Cervical / Trunk Assessment: Normal   Communication Communication Communication: No difficulties   Cognition Arousal/Alertness: Awake/alert Behavior During Therapy: WFL for tasks assessed/performed Overall Cognitive Status: Within Functional Limits for tasks assessed                     General Comments   Pt pleasant and cooperative    Exercises  L elbow/wris/hand ROM to tolerance 5 reps, 3 sets      Shoulder Instructions Shoulder Instructions Donning/doffing shirt without moving shoulder: Caregiver independent with task;Minimal assistance Method for sponge bathing under operated UE: Supervision/safety;Caregiver independent with  task Donning/doffing sling/immobilizer: Minimal assistance;Caregiver independent with task Correct positioning of sling/immobilizer: Supervision/safety;Caregiver independent with task ROM for elbow, wrist and digits of operated UE: Supervision/safety;Caregiver independent with task Sling wearing schedule  (on at all times/off for ADL's): Supervision/safety Proper positioning of operated UE when showering: Supervision/safety;Caregiver independent with task Positioning of UE while sleeping: Supervision/safety;Caregiver independent with task    Home Living Family/patient expects to be discharged to:: Private residence Living Arrangements: Spouse/significant other Available Help at Discharge: Family Type of Home: House Home Access: Stairs to enter Technical brewer of Steps: 1   Home Layout: One level     Bathroom Shower/Tub: Teacher, early years/pre: Handicapped height     Home Equipment: Shower seat - built in;Grab bars - toilet;Grab bars - tub/shower          Prior Functioning/Environment Level of Independence: Independent             OT Diagnosis: Acute pain   OT Problem List: Pain;Impaired UE functional use;Decreased range of motion   OT Treatment/Interventions:      OT Goals(Current goals can be found in the care plan section) Acute Rehab OT Goals Patient Stated Goal: go home today OT Goal Formulation: With patient/family       Barriers to D/C:  none                        End of Session Equipment Utilized During Treatment: Other (comment) (L UE sling)  Activity Tolerance: Patient tolerated treatment well Patient left: in chair;with call bell/phone within reach;with family/visitor present   Time: [id number] OT Time Calculation (min): 44 min Charges:  OT General Charges $OT Visit: 1 Procedure OT Evaluation $OT Eval Moderate Complexity: 1 Procedure OT Treatments $Self Care/Home Management : 8-22 mins $Therapeutic Activity: 8-22 mins $Therapeutic Exercise: 8-22 mins G-Codes:    Britt Bottom 10/15/2015, 10:30 AM

## 2015-10-15 NOTE — Progress Notes (Signed)
RT was called by charge RT at 0150 stating patient in 5N23 was very angry because he had been waiting hours for a CPAP and hadn't received one yet. RT called RN to verify and she stated she had called several times and no one came to place on a CPAP. RT never received a call from RN to place the patient on CPAP. RT checked worklist and order was not showing. Order start time was not until July 26th at 2200. RT set up CPAP for patient and explained to patient the mishap. Patient tolerating CPAP well at this time.

## 2015-10-15 NOTE — Progress Notes (Signed)
Discharge instructions given. Pt verbalized understanding and all questions were answered.  

## 2015-10-15 NOTE — Discharge Summary (Signed)
Physician Discharge Summary  Patient ID: Eric [name] MRN: [id number] DOB/AGE: 1946/01/31 70 y.o.  Admit date: 10/14/2015 Discharge date: 10/15/2015  Admission Diagnoses:  Post-traumatic arthrosis of left shoulder  Discharge Diagnoses:  Principal Problem:   Post-traumatic arthrosis of left shoulder Active Problems:   S/P shoulder replacement   Past Medical History:  Diagnosis Date  . Colon polyps   . Diverticulosis of colon   . Dysrhythmia    Palpations from caffiene  . GERD (gastroesophageal reflux disease)   . History of hay fever   . Hx of cardiovascular stress test    Lexiscan Myoview (04/2013):  No ischemia, EF 61%; low risk  . Hyperlipemia   . Internal hemorrhoids without mention of complication   . Osteoarthrosis, unspecified whether generalized or localized, unspecified site   . 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 Recent weight loss    75lbs  . Seasonal allergies   . Sleep apnea    CPAP    Surgeries: Procedure(s): LEFT TOTAL SHOULDER ARTHROPLASTY on 10/14/2015   Consultants (if any):   Discharged Condition: Improved  Hospital Course: Eric [name] is an 70 y.o. male who was admitted 10/14/2015 with a diagnosis of Post-traumatic arthrosis of left shoulder and went to the operating room on 10/14/2015 and underwent the above named procedures.    He was given perioperative antibiotics:  Anti-infectives    Start     Dose/Rate Route Frequency Ordered Stop   10/14/15 1400  ceFAZolin (ANCEF) IVPB 1 g/50 mL premix     1 g 100 mL/hr over 30 Minutes Intravenous Every 6 hours 10/14/15 1055 10/15/15 0430   10/14/15 0630  ceFAZolin (ANCEF) IVPB 2g/100 mL premix     2 g 200 mL/hr over 30 Minutes Intravenous To Short Stay 10/13/15 0945 10/14/15 0753    .  He was given sequential compression devices, early ambulation, for DVT prophylaxis.  He benefited maximally from the hospital stay and there were no complications.    Recent vital signs:   Vitals:   10/15/15 0200 10/15/15 0445  BP:  (!) 100/57  Pulse: 61 66  Resp: 16 17  Temp:  98.2 F (36.8 C)    Recent laboratory studies:  Lab Results  Component Value Date   HGB 12.7 (L) 10/15/2015   HGB 14.0 10/02/2015   HGB 13.6 09/01/2015   Lab Results  Component Value Date   WBC 8.0 10/15/2015   PLT 129 (L) 10/15/2015   No results found for: INR Lab Results  Component Value Date   NA 140 10/15/2015   K 4.3 10/15/2015   CL 105 10/15/2015   CO2 27 10/15/2015   BUN 12 10/15/2015   CREATININE 0.90 10/15/2015   GLUCOSE 98 10/15/2015    Discharge Medications:     Medication List    STOP taking these medications   meloxicam 15 MG tablet Commonly known as:  MOBIC     TAKE these medications   aspirin 81 MG EC tablet Take 81 mg by mouth daily.   atorvastatin 40 MG tablet Commonly known as:  LIPITOR TAKE 1 TABLET DAILY What changed:  See the new instructions.   baclofen 10 MG tablet Commonly known as:  LIORESAL Take 1 tablet (10 mg total) by mouth 3 (three) times daily. As needed for muscle spasm   Coenzyme Q-10 200 MG Caps Take 1 each by mouth daily.   esomeprazole 40 MG capsule Commonly known as:  NEXIUM Take 40 mg by mouth daily at 12 noon.  fluticasone 50 MCG/ACT nasal spray Commonly known as:  FLONASE Place 1 spray into both nostrils daily as needed for allergies or rhinitis.   HYDROcodone-acetaminophen 10-325 MG tablet Commonly known as:  NORCO Take 1-2 tablets by mouth every 6 (six) hours as needed.   loratadine 10 MG tablet Commonly known as:  CLARITIN Take 10 mg by mouth daily.   MULTIVITAMIN PO Take 1 tablet by mouth daily.   multivitamin tablet Take 1 tablet by mouth daily.   NONFORMULARY OR COMPOUNDED ITEM apap cpap maching--auto adjusting pressure  dx sleep apnea   ondansetron 4 MG tablet Commonly known as:  ZOFRAN Take 1 tablet (4 mg total) by mouth every 8 (eight) hours as needed for nausea or vomiting.   pantoprazole  40 MG tablet Commonly known as:  PROTONIX Take 1 tablet (40 mg total) by mouth daily.   sennosides-docusate sodium 8.6-50 MG tablet Commonly known as:  SENOKOT-S Take 2 tablets by mouth daily.   tamsulosin 0.4 MG Caps capsule Commonly known as:  FLOMAX Take 1 capsule (0.4 mg total) by mouth daily.   trimethobenzamide 300 MG capsule Commonly known as:  TIGAN Take 1 capsule (300 mg total) by mouth as needed.   valsartan 80 MG tablet Commonly known as:  DIOVAN Take 1 tablet (80 mg total) by mouth daily.       Diagnostic Studies: US Soft Tissue Head/neck  Result Date: 10/03/2015 CLINICAL DATA:  Thyroid nodules noted on CT EXAM: THYROID ULTRASOUND TECHNIQUE: Ultrasound examination of the thyroid gland and adjacent soft tissues was performed. COMPARISON:  None. FINDINGS: Right thyroid lobe Measurements: 5.5 x 2.7 x 2.5 cm. 2.2 x 3.0 x 2.0 cm heterogeneous mass in the lower pole. Adjacent medial 1.2 x 1.2 x 1.5 cm nodule. 0.9 cm upper pole complex cyst. Left thyroid lobe Measurements: 5.6 x 1.9 x 2.5 cm. 1.5 x 1.2 x 1.5 cm solid nodule with calcification. Smaller upper pole nodules measure 1.1 cm and 1.1 cm. Isthmus Thickness: 1.6 cm.  Complex lower isthmic nodule measures 1.1 cm new Lymphadenopathy None visualized. IMPRESSION: Multiple bilateral nodules are present. The dominant nodule is in the lower pole of the right lobe measuring 3.0 cm. Findings meet consensus criteria for biopsy. Ultrasound-guided fine needle aspiration should be considered, as per the consensus statement: Management of Thyroid Nodules Detected at [place]: Society of Radiologists in Ultrasound Consensus Conference Statement. Radiology 2005; [id number]. Electronically Signed   By: Jolaine Click M.D.   On: 10/03/2015 13:08   Dg Epidurography  Result Date: 09/15/2015 CLINICAL DATA:  Chronic low back pain. Bilateral L5 pars defects. Anterolisthesis and displacement of the L5-S1 lumbar disc. FLUOROSCOPY TIME:  87.67 uGy*m2  PROCEDURE: LUMBAR EPIDURAL INJECTION: An interlaminar approach was performed on the left at L5-S1. The overlying skin was cleansed and anesthetized. A 20 gauge spinal needle was advanced using loss-of-resistance technique. Injection of 2cc of Isovue-M 200 confirmed epidural placement. There was no evidence for intravascular or intrathecal spread of contrast. I then injected 120 mg of Depo-Medrol and 3ml of 1% lidocaine. The patient tolerated the procedure without evidence for complication. The patient was observed for 20 minutes prior to discharge in stable neurologic condition. IMPRESSION: Technically successful first interlaminar epidural steroid injection on the left at L5-S1. Electronically Signed   By: Marin Roberts M.D.   On: 09/15/2015 11:45   Ct Shoulder Left Wo Contrast  Result Date: 09/18/2015 CLINICAL DATA:  Bilateral shoulder pain with decreased range of motion for several years. Remote injury falling from  a mountain in 1978. History of AC joint separation on the right in 2009. EXAM: CT OF THE LEFT SHOULDER WITHOUT CONTRAST TECHNIQUE: Multidetector CT imaging was performed according to the standard protocol. Multiplanar CT image reconstructions were also generated. COMPARISON:  Radiographs 04/02/2014. FINDINGS: Bones: There is no evidence of acute fracture or dislocation. There is no evidence of humeral head avascular necrosis. Joint/cartilage: There is severe glenohumeral arthropathy with joint space loss, osteophytes and subchondral cyst formation. There is a small shoulder joint effusion. Several small loose bodies are present within the joint, primarily within the superior subscapularis recess. The acromion is type 1. There are mild acromioclavicular degenerative changes. Ligaments: Not applicable for exam/indication. Tendons/muscles: The subacromial space is moderately narrowed to 3.6 mm. There is generalized muscular atrophy surrounding the shoulder. No focal rotator cuff muscular  atrophy or gross rotator cuff tear is seen. Neurovascular/other soft tissues: No significant periarticular soft tissue abnormalities are seen. There is a 1.5 cm calcified thyroid nodule inferiorly on the left. Coronary artery atherosclerosis noted. IMPRESSION: 1. Severe osteoarthritis of the left shoulder with a small joint effusion and small intra-articular loose bodies. 2. The subacromial space of the left shoulder is moderately narrowed, predisposing the patient to rotator cuff impingement. However, no gross rotator cuff tear or focal muscular atrophy identified by CT. Electronically Signed   By: Richardean Sale M.D.   On: 09/18/2015 11:27   Ct Shoulder Right Wo Contrast  Result Date: 09/18/2015 CLINICAL DATA:  Bilateral shoulder pain with decreased range of motion for several years. Remote injury falling [place] a [place] in 1978. History of AC joint separation on the right in 2009. EXAM: CT OF THE RIGHT SHOULDER WITHOUT CONTRAST TECHNIQUE: Multidetector CT imaging was performed according to the standard protocol. Multiplanar CT image reconstructions were also generated. COMPARISON:  None. FINDINGS: Bones: There is no evidence of acute fracture or dislocation. There is flattening of the articular surface of the humeral head, probably related to long-standing arthropathy. No definite findings of avascular necrosis. Joint/cartilage: There is severe glenohumeral arthropathy with joint space loss, osteophytes and subchondral cyst formation. There is a focal osteophyte projecting superiorly from the humeral head, contributing to marked narrowing of the subacromial space and probable rotator cuff impingement. There is a small shoulder joint effusion. Several loose bodies are present both anteriorly and posteriorly. The acromion is type 1. There are mild acromioclavicular degenerative changes. Ligaments: Not applicable for exam/indication. Tendons/muscles: There is mild generalized muscular atrophy surrounding the  shoulder. No focal rotator cuff muscular atrophy or gross full-thickness tendon tear identified. As above, there is significant narrowing of the subacromial space which is exacerbated by superiorly projecting osteophytes from the humeral head. These likely contribute to rotator cuff impingement. Neurovascular/other soft tissues: No focal inflammatory changes are seen surrounding the shoulder. 1.5 cm calcified left thyroid nodule and 2.0 cm noncalcified right thyroid nodule are noted. There is coronary artery atherosclerosis. IMPRESSION: 1. Severe glenohumeral arthropathy with small joint effusion and multiple loose bodies. Given the advanced fairly symmetric nature of these findings in both shoulders, CPPD arthropathy is favored. 2. Significant narrowing of the subacromial space likely contributing to rotator cuff impingement. No focal rotator cuff muscular atrophy identified. 3. Bilateral thyroid nodules. Correlation with thyroid ultrasound recommended. 4. Coronary artery atherosclerosis. Electronically Signed   By: Richardean Sale M.D.   On: 09/18/2015 11:35   Dg Shoulder Left Port  Result Date: 10/14/2015 CLINICAL DATA:  Status poles left shoulder replacement. EXAM: LEFT SHOULDER - 1 VIEW COMPARISON:  Radiographs of April 02, 2014. FINDINGS: Status post left shoulder arthroplasty. Prosthesis appears to be well situated. Expected postoperative findings are seen in the surrounding soft tissues. No fracture or dislocation is noted. IMPRESSION: Status post left shoulder arthroplasty. Electronically Signed   By: Marijo Conception, M.D.   On: 10/14/2015 11:32   Disposition: home.    Follow-up Information    Rushawn Capshaw P, MD. Schedule an appointment as soon as possible for a visit in 2 weeks.   Specialty:  Orthopedic Surgery Contact information: [place] [address] [phone]            Signed: Johnny Bridge 10/15/2015, 8:58 AM

## 2015-10-16 ENCOUNTER — Encounter: Payer: Self-pay | Admitting: Family Medicine

## 2015-10-16 NOTE — Anesthesia Postprocedure Evaluation (Signed)
Anesthesia Post Note  Patient: Eric [name]  Procedure(s) Performed: Procedure(s) (LRB): LEFT TOTAL SHOULDER ARTHROPLASTY (Left)  Patient location during evaluation: PACU Anesthesia Type: General and Regional Level of consciousness: awake Pain management: pain level controlled Vital Signs Assessment: post-procedure vital signs reviewed and stable Respiratory status: spontaneous breathing Cardiovascular status: stable Postop Assessment: no signs of nausea or vomiting Anesthetic complications: no    Last Vitals:  Vitals:   10/15/15 0200 10/15/15 0445  BP:  (!) 100/57  Pulse: 61 66  Resp: 16 17  Temp:  36.8 C    Last Pain:  Vitals:   10/15/15 1116  TempSrc:   PainSc: 3                  Safir Michalec

## 2015-10-27 DIAGNOSIS — M19012 Primary osteoarthritis, left shoulder: Secondary | ICD-10-CM | POA: Diagnosis not present

## 2015-10-28 ENCOUNTER — Encounter: Payer: Self-pay | Admitting: Family Medicine

## 2015-10-28 DIAGNOSIS — E041 Nontoxic single thyroid nodule: Secondary | ICD-10-CM | POA: Diagnosis not present

## 2015-10-28 NOTE — Assessment & Plan Note (Signed)
Pt is on cpap and has been for years  Pt requesting rx for cpap supplies  15 cmh2o

## 2015-10-28 NOTE — Telephone Encounter (Signed)
I am working on the CPAP, the rest is a Pharmacist, hospital.    KP

## 2015-10-30 ENCOUNTER — Encounter: Payer: Self-pay | Admitting: Family Medicine

## 2015-10-30 DIAGNOSIS — E041 Nontoxic single thyroid nodule: Secondary | ICD-10-CM | POA: Diagnosis not present

## 2015-10-30 DIAGNOSIS — E042 Nontoxic multinodular goiter: Secondary | ICD-10-CM | POA: Diagnosis not present

## 2015-10-30 NOTE — Telephone Encounter (Signed)
Ok to send order. 

## 2015-11-04 NOTE — Telephone Encounter (Signed)
Was this taken care of?

## 2015-11-04 NOTE — Telephone Encounter (Signed)
No, I am asking you is it ok to change the orders, the sleep study in 2013 did not reflect what the patient is asking for. I can not change the orders w/o your approval.    KP

## 2015-11-04 NOTE — Telephone Encounter (Signed)
This was not specified on his last Sleep study, please advise if it ok to change.   KP

## 2015-11-04 NOTE — Telephone Encounter (Signed)
I think he needs the new machine and they are auto adjust---- if that is all hes asking for -- ok to do order

## 2015-11-04 NOTE — Telephone Encounter (Signed)
Is this taken care of now?

## 2015-11-08 ENCOUNTER — Other Ambulatory Visit: Payer: Self-pay | Admitting: Family Medicine

## 2015-11-10 ENCOUNTER — Telehealth: Payer: Self-pay | Admitting: *Deleted

## 2015-11-10 NOTE — Telephone Encounter (Signed)
Received Physician's Certification for Positive Airway Pressure Devices paperwork via fax from [facility].  Placed in folder for Dr. Carollee Herter to complete and sign.//AB/CMA

## 2015-11-17 NOTE — Telephone Encounter (Signed)
Received completed and signed [id number] Certification for Positive Airway Pressure Devices from Dr. Carollee Herter.  All forms faxed to [facility] at [phone]).  Confirmation received.//AB/CMA

## 2015-11-28 DIAGNOSIS — M19011 Primary osteoarthritis, right shoulder: Secondary | ICD-10-CM | POA: Diagnosis not present

## 2015-11-28 DIAGNOSIS — M19012 Primary osteoarthritis, left shoulder: Secondary | ICD-10-CM | POA: Diagnosis not present

## 2015-12-02 DIAGNOSIS — R293 Abnormal posture: Secondary | ICD-10-CM | POA: Diagnosis not present

## 2015-12-02 DIAGNOSIS — Z96612 Presence of left artificial shoulder joint: Secondary | ICD-10-CM | POA: Diagnosis not present

## 2015-12-02 DIAGNOSIS — M25612 Stiffness of left shoulder, not elsewhere classified: Secondary | ICD-10-CM | POA: Diagnosis not present

## 2015-12-02 DIAGNOSIS — Z7409 Other reduced mobility: Secondary | ICD-10-CM | POA: Diagnosis not present

## 2015-12-02 DIAGNOSIS — M25512 Pain in left shoulder: Secondary | ICD-10-CM | POA: Diagnosis not present

## 2015-12-02 DIAGNOSIS — Z471 Aftercare following joint replacement surgery: Secondary | ICD-10-CM | POA: Diagnosis not present

## 2015-12-03 DIAGNOSIS — M25512 Pain in left shoulder: Secondary | ICD-10-CM | POA: Diagnosis not present

## 2015-12-03 DIAGNOSIS — Z96612 Presence of left artificial shoulder joint: Secondary | ICD-10-CM | POA: Diagnosis not present

## 2015-12-03 DIAGNOSIS — Z471 Aftercare following joint replacement surgery: Secondary | ICD-10-CM | POA: Diagnosis not present

## 2015-12-03 DIAGNOSIS — Z7409 Other reduced mobility: Secondary | ICD-10-CM | POA: Diagnosis not present

## 2015-12-03 DIAGNOSIS — R293 Abnormal posture: Secondary | ICD-10-CM | POA: Diagnosis not present

## 2015-12-03 DIAGNOSIS — M25612 Stiffness of left shoulder, not elsewhere classified: Secondary | ICD-10-CM | POA: Diagnosis not present

## 2015-12-10 DIAGNOSIS — Z96612 Presence of left artificial shoulder joint: Secondary | ICD-10-CM | POA: Diagnosis not present

## 2015-12-10 DIAGNOSIS — R293 Abnormal posture: Secondary | ICD-10-CM | POA: Diagnosis not present

## 2015-12-10 DIAGNOSIS — M25612 Stiffness of left shoulder, not elsewhere classified: Secondary | ICD-10-CM | POA: Diagnosis not present

## 2015-12-10 DIAGNOSIS — M25512 Pain in left shoulder: Secondary | ICD-10-CM | POA: Diagnosis not present

## 2015-12-10 DIAGNOSIS — Z471 Aftercare following joint replacement surgery: Secondary | ICD-10-CM | POA: Diagnosis not present

## 2015-12-10 DIAGNOSIS — Z7409 Other reduced mobility: Secondary | ICD-10-CM | POA: Diagnosis not present

## 2015-12-15 ENCOUNTER — Encounter: Payer: Self-pay | Admitting: Family Medicine

## 2015-12-15 ENCOUNTER — Telehealth: Payer: Self-pay

## 2015-12-15 NOTE — Telephone Encounter (Signed)
66 Certification for Positive Airway Pressure Devices (PAP) Obstructive Sleep Apnea.  Ordered completed and signed by provider, then faxed back.  Fax confirmation received.

## 2015-12-16 DIAGNOSIS — M25512 Pain in left shoulder: Secondary | ICD-10-CM | POA: Diagnosis not present

## 2015-12-16 DIAGNOSIS — Z96612 Presence of left artificial shoulder joint: Secondary | ICD-10-CM | POA: Diagnosis not present

## 2015-12-16 DIAGNOSIS — Z7409 Other reduced mobility: Secondary | ICD-10-CM | POA: Diagnosis not present

## 2015-12-16 DIAGNOSIS — R293 Abnormal posture: Secondary | ICD-10-CM | POA: Diagnosis not present

## 2015-12-16 DIAGNOSIS — Z471 Aftercare following joint replacement surgery: Secondary | ICD-10-CM | POA: Diagnosis not present

## 2015-12-16 DIAGNOSIS — M25612 Stiffness of left shoulder, not elsewhere classified: Secondary | ICD-10-CM | POA: Diagnosis not present

## 2015-12-18 DIAGNOSIS — Z7409 Other reduced mobility: Secondary | ICD-10-CM | POA: Diagnosis not present

## 2015-12-18 DIAGNOSIS — Z471 Aftercare following joint replacement surgery: Secondary | ICD-10-CM | POA: Diagnosis not present

## 2015-12-18 DIAGNOSIS — M25612 Stiffness of left shoulder, not elsewhere classified: Secondary | ICD-10-CM | POA: Diagnosis not present

## 2015-12-18 DIAGNOSIS — M25512 Pain in left shoulder: Secondary | ICD-10-CM | POA: Diagnosis not present

## 2015-12-18 DIAGNOSIS — R293 Abnormal posture: Secondary | ICD-10-CM | POA: Diagnosis not present

## 2015-12-18 DIAGNOSIS — Z96612 Presence of left artificial shoulder joint: Secondary | ICD-10-CM | POA: Diagnosis not present

## 2015-12-22 DIAGNOSIS — Z471 Aftercare following joint replacement surgery: Secondary | ICD-10-CM | POA: Diagnosis not present

## 2015-12-22 DIAGNOSIS — M25512 Pain in left shoulder: Secondary | ICD-10-CM | POA: Diagnosis not present

## 2015-12-22 DIAGNOSIS — Z7409 Other reduced mobility: Secondary | ICD-10-CM | POA: Diagnosis not present

## 2015-12-22 DIAGNOSIS — M25612 Stiffness of left shoulder, not elsewhere classified: Secondary | ICD-10-CM | POA: Diagnosis not present

## 2015-12-22 DIAGNOSIS — Z96612 Presence of left artificial shoulder joint: Secondary | ICD-10-CM | POA: Diagnosis not present

## 2015-12-22 DIAGNOSIS — R293 Abnormal posture: Secondary | ICD-10-CM | POA: Diagnosis not present

## 2015-12-24 DIAGNOSIS — Z96612 Presence of left artificial shoulder joint: Secondary | ICD-10-CM | POA: Diagnosis not present

## 2015-12-24 DIAGNOSIS — M25612 Stiffness of left shoulder, not elsewhere classified: Secondary | ICD-10-CM | POA: Diagnosis not present

## 2015-12-24 DIAGNOSIS — Z471 Aftercare following joint replacement surgery: Secondary | ICD-10-CM | POA: Diagnosis not present

## 2015-12-24 DIAGNOSIS — Z7409 Other reduced mobility: Secondary | ICD-10-CM | POA: Diagnosis not present

## 2015-12-24 DIAGNOSIS — R293 Abnormal posture: Secondary | ICD-10-CM | POA: Diagnosis not present

## 2015-12-24 DIAGNOSIS — M25512 Pain in left shoulder: Secondary | ICD-10-CM | POA: Diagnosis not present

## 2015-12-29 DIAGNOSIS — M19011 Primary osteoarthritis, right shoulder: Secondary | ICD-10-CM | POA: Diagnosis not present

## 2015-12-30 DIAGNOSIS — M25612 Stiffness of left shoulder, not elsewhere classified: Secondary | ICD-10-CM | POA: Diagnosis not present

## 2015-12-30 DIAGNOSIS — Z471 Aftercare following joint replacement surgery: Secondary | ICD-10-CM | POA: Diagnosis not present

## 2015-12-30 DIAGNOSIS — Z96612 Presence of left artificial shoulder joint: Secondary | ICD-10-CM | POA: Diagnosis not present

## 2015-12-30 DIAGNOSIS — Z7409 Other reduced mobility: Secondary | ICD-10-CM | POA: Diagnosis not present

## 2015-12-30 DIAGNOSIS — M25512 Pain in left shoulder: Secondary | ICD-10-CM | POA: Diagnosis not present

## 2015-12-30 DIAGNOSIS — R293 Abnormal posture: Secondary | ICD-10-CM | POA: Diagnosis not present

## 2016-01-01 DIAGNOSIS — Z7409 Other reduced mobility: Secondary | ICD-10-CM | POA: Diagnosis not present

## 2016-01-01 DIAGNOSIS — Z96612 Presence of left artificial shoulder joint: Secondary | ICD-10-CM | POA: Diagnosis not present

## 2016-01-01 DIAGNOSIS — M25612 Stiffness of left shoulder, not elsewhere classified: Secondary | ICD-10-CM | POA: Diagnosis not present

## 2016-01-05 DIAGNOSIS — M25612 Stiffness of left shoulder, not elsewhere classified: Secondary | ICD-10-CM | POA: Diagnosis not present

## 2016-01-05 DIAGNOSIS — Z96612 Presence of left artificial shoulder joint: Secondary | ICD-10-CM | POA: Diagnosis not present

## 2016-01-05 DIAGNOSIS — Z7409 Other reduced mobility: Secondary | ICD-10-CM | POA: Diagnosis not present

## 2016-01-07 DIAGNOSIS — Z7409 Other reduced mobility: Secondary | ICD-10-CM | POA: Diagnosis not present

## 2016-01-07 DIAGNOSIS — M25612 Stiffness of left shoulder, not elsewhere classified: Secondary | ICD-10-CM | POA: Diagnosis not present

## 2016-01-07 DIAGNOSIS — Z96612 Presence of left artificial shoulder joint: Secondary | ICD-10-CM | POA: Diagnosis not present

## 2016-01-12 DIAGNOSIS — M25612 Stiffness of left shoulder, not elsewhere classified: Secondary | ICD-10-CM | POA: Diagnosis not present

## 2016-01-12 DIAGNOSIS — Z96612 Presence of left artificial shoulder joint: Secondary | ICD-10-CM | POA: Diagnosis not present

## 2016-01-12 DIAGNOSIS — Z7409 Other reduced mobility: Secondary | ICD-10-CM | POA: Diagnosis not present

## 2016-01-16 DIAGNOSIS — Z7409 Other reduced mobility: Secondary | ICD-10-CM | POA: Diagnosis not present

## 2016-01-16 DIAGNOSIS — M25612 Stiffness of left shoulder, not elsewhere classified: Secondary | ICD-10-CM | POA: Diagnosis not present

## 2016-01-16 DIAGNOSIS — Z96612 Presence of left artificial shoulder joint: Secondary | ICD-10-CM | POA: Diagnosis not present

## 2016-01-19 DIAGNOSIS — Z7409 Other reduced mobility: Secondary | ICD-10-CM | POA: Diagnosis not present

## 2016-01-19 DIAGNOSIS — Z96612 Presence of left artificial shoulder joint: Secondary | ICD-10-CM | POA: Diagnosis not present

## 2016-01-19 DIAGNOSIS — M25612 Stiffness of left shoulder, not elsewhere classified: Secondary | ICD-10-CM | POA: Diagnosis not present

## 2016-01-23 DIAGNOSIS — Z96612 Presence of left artificial shoulder joint: Secondary | ICD-10-CM | POA: Diagnosis not present

## 2016-01-23 DIAGNOSIS — M25612 Stiffness of left shoulder, not elsewhere classified: Secondary | ICD-10-CM | POA: Diagnosis not present

## 2016-01-23 DIAGNOSIS — Z7409 Other reduced mobility: Secondary | ICD-10-CM | POA: Diagnosis not present

## 2016-01-27 DIAGNOSIS — Z96612 Presence of left artificial shoulder joint: Secondary | ICD-10-CM | POA: Diagnosis not present

## 2016-01-27 DIAGNOSIS — Z7409 Other reduced mobility: Secondary | ICD-10-CM | POA: Diagnosis not present

## 2016-01-27 DIAGNOSIS — M25612 Stiffness of left shoulder, not elsewhere classified: Secondary | ICD-10-CM | POA: Diagnosis not present

## 2016-01-29 DIAGNOSIS — M25612 Stiffness of left shoulder, not elsewhere classified: Secondary | ICD-10-CM | POA: Diagnosis not present

## 2016-01-29 DIAGNOSIS — Z7409 Other reduced mobility: Secondary | ICD-10-CM | POA: Diagnosis not present

## 2016-01-29 DIAGNOSIS — Z96612 Presence of left artificial shoulder joint: Secondary | ICD-10-CM | POA: Diagnosis not present

## 2016-02-03 DIAGNOSIS — Z96612 Presence of left artificial shoulder joint: Secondary | ICD-10-CM | POA: Diagnosis not present

## 2016-02-03 DIAGNOSIS — M25612 Stiffness of left shoulder, not elsewhere classified: Secondary | ICD-10-CM | POA: Diagnosis not present

## 2016-02-03 DIAGNOSIS — Z471 Aftercare following joint replacement surgery: Secondary | ICD-10-CM | POA: Diagnosis not present

## 2016-02-03 DIAGNOSIS — Z7409 Other reduced mobility: Secondary | ICD-10-CM | POA: Diagnosis not present

## 2016-02-05 DIAGNOSIS — M25612 Stiffness of left shoulder, not elsewhere classified: Secondary | ICD-10-CM | POA: Diagnosis not present

## 2016-02-05 DIAGNOSIS — Z96612 Presence of left artificial shoulder joint: Secondary | ICD-10-CM | POA: Diagnosis not present

## 2016-02-05 DIAGNOSIS — Z471 Aftercare following joint replacement surgery: Secondary | ICD-10-CM | POA: Diagnosis not present

## 2016-02-05 DIAGNOSIS — Z7409 Other reduced mobility: Secondary | ICD-10-CM | POA: Diagnosis not present

## 2016-02-09 ENCOUNTER — Other Ambulatory Visit: Payer: Self-pay | Admitting: Family Medicine

## 2016-02-10 DIAGNOSIS — Z7409 Other reduced mobility: Secondary | ICD-10-CM | POA: Diagnosis not present

## 2016-02-10 DIAGNOSIS — Z471 Aftercare following joint replacement surgery: Secondary | ICD-10-CM | POA: Diagnosis not present

## 2016-02-10 DIAGNOSIS — Z96612 Presence of left artificial shoulder joint: Secondary | ICD-10-CM | POA: Diagnosis not present

## 2016-02-10 DIAGNOSIS — M25612 Stiffness of left shoulder, not elsewhere classified: Secondary | ICD-10-CM | POA: Diagnosis not present

## 2016-02-25 ENCOUNTER — Encounter: Payer: Self-pay | Admitting: Family Medicine

## 2016-02-25 NOTE — Telephone Encounter (Signed)
Denise--please see pt's mychart message and call him to schedule cpe. Thanks!

## 2016-03-04 ENCOUNTER — Encounter: Payer: Medicare Other | Admitting: Family Medicine

## 2016-03-31 DIAGNOSIS — M19012 Primary osteoarthritis, left shoulder: Secondary | ICD-10-CM | POA: Diagnosis not present

## 2016-03-31 DIAGNOSIS — M19011 Primary osteoarthritis, right shoulder: Secondary | ICD-10-CM | POA: Diagnosis not present

## 2016-04-13 ENCOUNTER — Encounter: Payer: Self-pay | Admitting: Family Medicine

## 2016-04-16 ENCOUNTER — Telehealth: Payer: Self-pay | Admitting: Family Medicine

## 2016-04-16 NOTE — Telephone Encounter (Signed)
Caller Name: Loma Messing Relation to pt: [employer]  Call back number: [phone] ext [phone] fax [phone]  Reason for call:  Checking on the status C-Pap form re fax to [phone]

## 2016-04-21 ENCOUNTER — Encounter: Payer: Self-pay | Admitting: Family Medicine

## 2016-04-22 NOTE — Progress Notes (Signed)
Pre visit review using our clinic review tool, if applicable. No additional management support is needed unless otherwise documented below in the visit note. 

## 2016-04-22 NOTE — Progress Notes (Signed)
Subjective:   Eric [name] is a 71 y.o. male who presents for Medicare Annual/Subsequent preventive examination.  Review of Systems:  No ROS.  Medicare Wellness Visit.  Sleep patterns: Sleeps 8 hrs per night. Feels "amazing". Uses cpap. Home Safety/Smoke Alarms:  Feels safe in home. Smoke alarms in place.  Living environment; residence and Firearm Safety: Lives in 1 story home with wife. Seat Belt Safety/Bike Helmet: Wears seat belt.   Counseling:   Eye Exam- Eye Doctor annually. Dental- [facility] [place] [facility] every 6 months.  Male:   CCS-  Last 03/19/13: Pre-cancerous polyp removed. Recall 5 yrs per report. PSA-  Lab Results  Component Value Date   PSA 0.79 09/01/2015   PSA 0.99 03/03/2015   PSA 0.85 02/11/2014        Objective:    Vitals: BP 118/70 (BP Location: Left Arm, Patient Position: Sitting, Cuff Size: Large)   Pulse (!) 52   Temp 97.9 F (36.6 C) (Oral)   Resp 16   Ht 5\' 9"  (1.753 m)   Wt 222 lb 12.8 oz (101.1 kg)   SpO2 97%   BMI 32.90 kg/m   Body mass index is 32.9 kg/m.  Tobacco History  Smoking Status  . Never Smoker  Smokeless Tobacco  . Never Used     Counseling given: Not Answered   Past Medical History:  Diagnosis Date  . Colon polyps   . Diverticulosis of colon   . Dysrhythmia    Palpations from caffiene  . GERD (gastroesophageal reflux disease)   . History of hay fever   . Hx of cardiovascular stress test    Lexiscan Myoview (04/2013):  No ischemia, EF 61%; low risk  . Hyperlipemia   . Internal hemorrhoids without mention of complication   . Osteoarthrosis, unspecified whether generalized or localized, unspecified site   . 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 Recent weight loss    75lbs  . Seasonal allergies   . Sleep apnea    CPAP   Past Surgical History:  Procedure Laterality Date  . COLONOSCOPY    . SHOULDER SURGERY  07-12-2007   right  . TONSILLECTOMY    . TOTAL SHOULDER ARTHROPLASTY Left 10/14/2015     Procedure: LEFT TOTAL SHOULDER ARTHROPLASTY;  Surgeon: Marchia Bond, MD;  Location: [facility];  Service: Orthopedics;  Laterality: Left;   Family History  Problem Relation Age of Onset  . Arthritis Mother   . Cancer Mother 53    breast  . Hyperlipidemia Mother   . Colon polyps Mother 21  . Heart disease Father     CAD--passed away 07/12/2011 age 62  . Hyperlipidemia Father   . Hypertension Father   . Cancer Sister 80    breast  . Cancer Brother 7    prostate  . Breast cancer    . Prostate cancer    . Colon cancer Neg Hx    History  Sexual Activity  . Sexual activity: Yes  . Partners: Female    Outpatient Encounter Prescriptions as of 04/23/2016  Medication Sig  . aspirin 81 MG EC tablet Take 81 mg by mouth daily.    [name] atorvastatin (LIPITOR) 40 MG tablet TAKE 1 TABLET DAILY (Patient taking differently: TAKE 1/2 TABLET DAILY)  . Coenzyme Q-10 200 MG CAPS Take 1 each by mouth daily.  . fluticasone (FLONASE) 50 MCG/ACT nasal spray Place 1 spray into both nostrils daily as needed for allergies or rhinitis.  [name] loratadine (CLARITIN) 10  MG tablet Take 10 mg by mouth daily.  . meloxicam (MOBIC) 15 MG tablet TAKE 1 TABLET DAILY  . Multiple Vitamin (MULTIVITAMIN PO) Take 1 tablet by mouth daily.   . Multiple Vitamin (MULTIVITAMIN) tablet Take 1 tablet by mouth daily.  . NONFORMULARY OR COMPOUNDED ITEM apap cpap maching--auto adjusting pressure  dx sleep apnea  . pantoprazole (PROTONIX) 40 MG tablet Take 1 tablet (40 mg total) by mouth daily.  . sennosides-docusate sodium (SENOKOT-S) 8.6-50 MG tablet Take 2 tablets by mouth daily.  . tamsulosin (FLOMAX) 0.4 MG CAPS capsule Take 1 capsule (0.4 mg total) by mouth daily.  [name] trimethobenzamide (TIGAN) 300 MG capsule Take 1 capsule (300 mg total) by mouth as needed.  . [DISCONTINUED] valsartan (DIOVAN) 80 MG tablet Take 1 tablet (80 mg total) by mouth daily.  . baclofen (LIORESAL) 10 MG tablet Take 1 tablet (10 mg total) by mouth 3 (three) times  daily. As needed for muscle spasm (Patient not taking: Reported on 04/23/2016)  . esomeprazole (NEXIUM) 40 MG capsule Take 40 mg by mouth daily at 12 noon.  [name] HYDROcodone-acetaminophen (NORCO) 10-325 MG tablet Take 1-2 tablets by mouth every 6 (six) hours as needed. (Patient not taking: Reported on 04/23/2016)  . ondansetron (ZOFRAN) 4 MG tablet Take 1 tablet (4 mg total) by mouth every 8 (eight) hours as needed for nausea or vomiting. (Patient not taking: Reported on 04/23/2016)   No facility-administered encounter medications on file as of 04/23/2016.     Activities of Daily Living In your present state of health, do you have any difficulty performing the following activities: 04/23/2016 04/23/2016  Hearing? Tempie Donning  Vision? N N  Difficulty concentrating or making decisions? N N  Walking or climbing stairs? N N  Dressing or bathing? N N  Doing errands, shopping? N N  Preparing Food and eating ? N -  Using the Toilet? N -  In the past six months, have you accidently leaked urine? N -  Do you have problems with loss of bowel control? N -  Managing your Medications? N -  Managing your Finances? N -  Housekeeping or managing your Housekeeping? N -  Some recent data might be hidden    Patient Care Team: [name], DO as PCP - General   Assessment:    Physical assessment deferred to PCP.  Exercise Activities and Dietary recommendations Current Exercise Habits: Home exercise routine, Type of exercise: walking, Time (Minutes): 30, Frequency (Times/Week): 5, Weekly Exercise (Minutes/Week): 150, Intensity: Moderate    Diet (meal preparation, eat out, water intake, caffeinated beverages, dairy products, fruits and vegetables): in general, a "healthy" diet  , on average, 3 meals per day     Goals    . Weight (lb) < 200 lb (90.7 kg)      Fall Risk Fall Risk  04/23/2016 04/23/2016 03/03/2015 02/11/2014 12/25/2012  Falls in the past year? No No No No No  Risk for fall due to : - - - - -  Risk  for fall due to (comments): - - - - -   Depression Screen PHQ 2/9 Scores 04/23/2016 04/23/2016 03/03/2015 02/11/2014  PHQ - 2 Score 0 0 0 0    Cognitive Function MMSE - Mini Mental State Exam 04/23/2016  Orientation to time 5  Orientation to Place 5  Registration 3  Attention/ Calculation 5  Recall 3  Language- name 2 objects 2  Language- repeat 1  Language- follow 3 step command 3  Language- read & follow direction 1  Write a sentence 1  Copy design 1  Total score 30        Immunization History  Administered Date(s) Administered  . Influenza Split 12/24/2011  . Influenza Whole 01/01/2009  . Influenza, High Dose Seasonal PF 11/26/2013, 01/01/2015  . Influenza,inj,Quad PF,36+ Mos 01/15/2013  . Influenza-Unspecified 12/15/2015  . Pneumococcal Conjugate-13 02/11/2014  . Pneumococcal Polysaccharide-23 12/24/2011  . Td 05/01/2008  . Tdap 01/01/2015  . Zoster 10/08/2010   Screening Tests Health Maintenance  Topic Date Due  . COLONOSCOPY  03/19/2018  . TETANUS/TDAP  12/31/2024  . INFLUENZA VACCINE  Completed  . ZOSTAVAX  Completed  . Hepatitis C Screening  Addressed  . PNA vac Low Risk Adult  Completed      Plan:      Continue to eat heart healthy diet (full of fruits, vegetables, whole grains, lean protein, water--limit salt, fat, and sugar intake) and increase physical activity as tolerated.  Continue doing brain stimulating activities (puzzles, reading, adult coloring books, staying active) to keep memory sharp.   During the course of the visit the patient was educated and counseled about the following appropriate screening and preventive services:   Vaccines to include Pneumoccal, Influenza, Hepatitis B, Td, Zostavax, HCV  Cardiovascular Disease  Colorectal cancer screening  Diabetes screening  Prostate Cancer Screening  Glaucoma screening  Nutrition counseling    Patient Instructions (the written plan) was given to the patient.    Naaman Plummer  [place], [place]  04/23/2016

## 2016-04-22 NOTE — Telephone Encounter (Signed)
Needs surgical clearance----  whats pt coming in for tomorrow?

## 2016-04-23 ENCOUNTER — Ambulatory Visit (INDEPENDENT_AMBULATORY_CARE_PROVIDER_SITE_OTHER): Payer: Medicare Other | Admitting: Family Medicine

## 2016-04-23 ENCOUNTER — Encounter: Payer: Self-pay | Admitting: Family Medicine

## 2016-04-23 ENCOUNTER — Telehealth: Payer: Self-pay

## 2016-04-23 VITALS — BP 118/70 | HR 52 | Temp 97.9°F | Resp 16 | Ht 69.0 in | Wt 222.8 lb

## 2016-04-23 DIAGNOSIS — Z125 Encounter for screening for malignant neoplasm of prostate: Secondary | ICD-10-CM

## 2016-04-23 DIAGNOSIS — Z01818 Encounter for other preprocedural examination: Secondary | ICD-10-CM

## 2016-04-23 DIAGNOSIS — E785 Hyperlipidemia, unspecified: Secondary | ICD-10-CM

## 2016-04-23 DIAGNOSIS — I10 Essential (primary) hypertension: Secondary | ICD-10-CM

## 2016-04-23 DIAGNOSIS — Z0000 Encounter for general adult medical examination without abnormal findings: Secondary | ICD-10-CM

## 2016-04-23 LAB — CBC
HCT: 46.8 % (ref 38.5–50.0)
Hemoglobin: 15.7 g/dL (ref 13.2–17.1)
MCH: 30.8 pg (ref 27.0–33.0)
MCHC: 33.5 g/dL (ref 32.0–36.0)
MCV: 91.9 fL (ref 80.0–100.0)
MPV: 10.2 fL (ref 7.5–12.5)
Platelets: 183 10*3/uL (ref 140–400)
RBC: 5.09 MIL/uL (ref 4.20–5.80)
RDW: 13.5 % (ref 11.0–15.0)
WBC: 6.1 10*3/uL (ref 3.8–10.8)

## 2016-04-23 LAB — LIPID PANEL
Cholesterol: 130 mg/dL (ref <200)
HDL: 57 mg/dL (ref >40)
LDL Cholesterol: 62 mg/dL (ref <100)
Total CHOL/HDL Ratio: 2.3 Ratio (ref <5.0)
Triglycerides: 56 mg/dL (ref <150)
VLDL: 11 mg/dL (ref <30)

## 2016-04-23 LAB — COMPREHENSIVE METABOLIC PANEL
ALT: 15 U/L (ref 9–46)
AST: 16 U/L (ref 10–35)
Albumin: 4.4 g/dL (ref 3.6–5.1)
Alkaline Phosphatase: 60 U/L (ref 40–115)
BUN: 15 mg/dL (ref 7–25)
CO2: 27 mmol/L (ref 20–31)
Calcium: 9.5 mg/dL (ref 8.6–10.3)
Chloride: 103 mmol/L (ref 98–110)
Creat: 0.93 mg/dL (ref 0.70–1.25)
Glucose, Bld: 81 mg/dL (ref 65–99)
Potassium: 4.7 mmol/L (ref 3.5–5.3)
Sodium: 140 mmol/L (ref 135–146)
Total Bilirubin: 1 mg/dL (ref 0.2–1.2)
Total Protein: 6.7 g/dL (ref 6.1–8.1)

## 2016-04-23 NOTE — Telephone Encounter (Signed)
Faxed completed Pre Operative Clearance, signed by provider. Confirmation received. LB

## 2016-04-23 NOTE — Patient Instructions (Addendum)
Go to Lab  Continue to eat heart healthy diet (full of fruits, vegetables, whole grains, lean protein, water--limit salt, fat, and sugar intake) and increase physical activity as tolerated.  Continue doing brain stimulating activities (puzzles, reading, adult coloring books, staying active) to keep memory sharp.   Preventive Care 15 Years and Older, Male Preventive care refers to lifestyle choices and visits with your health care provider that can promote health and wellness. What does preventive care include?  A yearly physical exam. This is also called an annual well check.  Dental exams once or twice a year.  Routine eye exams. Ask your health care provider how often you should have your eyes checked.  Personal lifestyle choices, including:  Daily care of your teeth and gums.  Regular physical activity.  Eating a healthy diet.  Avoiding tobacco and drug use.  Limiting alcohol use.  Practicing safe sex.  Taking low doses of aspirin every day.  Taking vitamin and mineral supplements as recommended by your health care provider. What happens during an annual well check? The services and screenings done by your health care provider during your annual well check will depend on your age, overall health, lifestyle risk factors, and family history of disease. Counseling  Your health care provider may ask you questions about your:  Alcohol use.  Tobacco use.  Drug use.  Emotional well-being.  Home and relationship well-being.  Sexual activity.  Eating habits.  History of falls.  Memory and ability to understand (cognition).  Work and work Statistician. Screening  You may have the following tests or measurements:  Height, weight, and BMI.  Blood pressure.  Lipid and cholesterol levels. These may be checked every 5 years, or more frequently if you are over 28 years old.  Skin check.  Lung cancer screening. You may have this screening every year starting at  age 11 if you have a 30-pack-year history of smoking and currently smoke or have quit within the past 15 years.  Fecal occult blood test (FOBT) of the stool. You may have this test every year starting at age 69.  Flexible sigmoidoscopy or colonoscopy. You may have a sigmoidoscopy every 5 years or a colonoscopy every 10 years starting at age 59.  Prostate cancer screening. Recommendations will vary depending on your family history and other risks.  Hepatitis C blood test.  Hepatitis B blood test.  Sexually transmitted disease (STD) testing.  Diabetes screening. This is done by checking your blood sugar (glucose) after you have not eaten for a while (fasting). You may have this done every 1-3 years.  Abdominal aortic aneurysm (AAA) screening. You may need this if you are a current or former smoker.  Osteoporosis. You may be screened starting at age 24 if you are at high risk. Talk with your health care provider about your test results, treatment options, and if necessary, the need for more tests. Vaccines  Your health care provider may recommend certain vaccines, such as:  Influenza vaccine. This is recommended every year.  Tetanus, diphtheria, and acellular pertussis (Tdap, Td) vaccine. You may need a Td booster every 10 years.  Varicella vaccine. You may need this if you have not been vaccinated.  Zoster vaccine. You may need this after age 69.  Measles, mumps, and rubella (MMR) vaccine. You may need at least one dose of MMR if you were born in 1957 or later. You may also need a second dose.  Pneumococcal 13-valent conjugate (PCV13) vaccine. One dose is recommended  after age 42.  Pneumococcal polysaccharide (PPSV23) vaccine. One dose is recommended after age 8.  Meningococcal vaccine. You may need this if you have certain conditions.  Hepatitis A vaccine. You may need this if you have certain conditions or if you travel or work in places where you may be exposed to hepatitis  A.  Hepatitis B vaccine. You may need this if you have certain conditions or if you travel or work in places where you may be exposed to hepatitis B.  Haemophilus influenzae type b (Hib) vaccine. You may need this if you have certain risk factors. Talk to your health care provider about which screenings and vaccines you need and how often you need them. This information is not intended to replace advice given to you by your health care provider. Make sure you discuss any questions you have with your health care provider. Document Released: 04/04/2015 Document Revised: 11/26/2015 Document Reviewed: 01/07/2015 Elsevier Interactive Patient Education  2017 [employer].

## 2016-04-23 NOTE — Progress Notes (Addendum)
Subjective:    Patient ID: Eric [name], male    DOB: 04-Mar-1946, 71 y.o.   MRN: [id number]   I acted as a Education administrator for Dr. Royden Purl, LPN   Chief Complaint  Patient presents with  . surgical clearance    pt report he is having surgery on right shoulder.    HPI Patient is in today for Pre Op Clearance.  He is having the R shoulder operated on by [facility].    Past Medical History:  Diagnosis Date  . Colon polyps   . Diverticulosis of colon   . Dysrhythmia    Palpations from caffiene  . GERD (gastroesophageal reflux disease)   . History of hay fever   . Hx of cardiovascular stress test    Lexiscan Myoview (04/2013):  No ischemia, EF 61%; low risk  . Hyperlipemia   . Internal hemorrhoids without mention of complication   . Osteoarthrosis, unspecified whether generalized or localized, unspecified site   . 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 Recent weight loss    75lbs  . Seasonal allergies   . Sleep apnea    CPAP    Past Surgical History:  Procedure Laterality Date  . COLONOSCOPY    . SHOULDER SURGERY  2007-06-29   right  . TONSILLECTOMY    . TOTAL SHOULDER ARTHROPLASTY Left 10/14/2015   Procedure: LEFT TOTAL SHOULDER ARTHROPLASTY;  Surgeon: Marchia Bond, MD;  Location: [facility];  Service: Orthopedics;  Laterality: Left;    Family History  Problem Relation Age of Onset  . Arthritis Mother   . Cancer Mother 67    breast  . Hyperlipidemia Mother   . Colon polyps Mother 36  . Heart disease Father     CAD--passed away 29-Jun-2011 age 77  . Hyperlipidemia Father   . Hypertension Father   . Cancer Sister 43    breast  . Cancer Brother 41    prostate  . Breast cancer    . Prostate cancer    . Colon cancer Neg Hx     Social History   Social History  . Marital status: Married    Spouse name: N/A  . Number of children: 3  . Years of education: N/A   Occupational History  . Retired--[employer]    Social History Main Topics  .  Smoking status: Never Smoker  . Smokeless tobacco: Never Used  . Alcohol use 0.0 oz/week     Comment: rare----12 a year  . Drug use: No  . Sexual activity: Yes    Partners: Female   Other Topics Concern  . Not on file   Social History Narrative   Lives with wife and daughter and two grandchildren.     Outpatient Medications Prior to Visit  Medication Sig Dispense Refill  . aspirin 81 MG EC tablet Take 81 mg by mouth daily.      [name] atorvastatin (LIPITOR) 40 MG tablet TAKE 1 TABLET DAILY (Patient taking differently: TAKE 1/2 TABLET DAILY) 90 tablet 1  . Coenzyme Q-10 200 MG CAPS Take 1 each by mouth daily.    . fluticasone (FLONASE) 50 MCG/ACT nasal spray Place 1 spray into both nostrils daily as needed for allergies or rhinitis.    [name] loratadine (CLARITIN) 10 MG tablet Take 10 mg by mouth daily.    . meloxicam (MOBIC) 15 MG tablet TAKE 1 TABLET DAILY 90 tablet 0  . Multiple Vitamin (MULTIVITAMIN PO) Take 1 tablet by mouth daily.     [name]  Multiple Vitamin (MULTIVITAMIN) tablet Take 1 tablet by mouth daily.    . NONFORMULARY OR COMPOUNDED ITEM apap cpap maching--auto adjusting pressure  dx sleep apnea 1 each 0  . pantoprazole (PROTONIX) 40 MG tablet Take 1 tablet (40 mg total) by mouth daily. 90 tablet 3  . sennosides-docusate sodium (SENOKOT-S) 8.6-50 MG tablet Take 2 tablets by mouth daily. 30 tablet 1  . tamsulosin (FLOMAX) 0.4 MG CAPS capsule Take 1 capsule (0.4 mg total) by mouth daily. 90 capsule 3  . trimethobenzamide (TIGAN) 300 MG capsule Take 1 capsule (300 mg total) by mouth as needed. 20 capsule 0  . valsartan (DIOVAN) 80 MG tablet Take 1 tablet (80 mg total) by mouth daily. 90 tablet 1  . baclofen (LIORESAL) 10 MG tablet Take 1 tablet (10 mg total) by mouth 3 (three) times daily. As needed for muscle spasm (Patient not taking: Reported on 04/23/2016) 50 tablet 0  . esomeprazole (NEXIUM) 40 MG capsule Take 40 mg by mouth daily at 12 noon.    [name] HYDROcodone-acetaminophen (NORCO)  10-325 MG tablet Take 1-2 tablets by mouth every 6 (six) hours as needed. (Patient not taking: Reported on 04/23/2016) 50 tablet 0  . ondansetron (ZOFRAN) 4 MG tablet Take 1 tablet (4 mg total) by mouth every 8 (eight) hours as needed for nausea or vomiting. (Patient not taking: Reported on 04/23/2016) 30 tablet 0   No facility-administered medications prior to visit.     Allergies  Allergen Reactions  . Lisinopril Cough    Review of Systems  Constitutional: Negative for fever and malaise/fatigue.  HENT: Negative for congestion.   Eyes: Negative for blurred vision.  Respiratory: Negative for cough and shortness of breath.   Cardiovascular: Negative for chest pain, palpitations and leg swelling.  Gastrointestinal: Negative for vomiting.  Genitourinary: Negative for dysuria, frequency and urgency.  Musculoskeletal: Negative for back pain.  Skin: Negative for rash.  Neurological: Negative for loss of consciousness and headaches.  Endo/Heme/Allergies: Negative for environmental allergies. Does not bruise/bleed easily.  Psychiatric/Behavioral: Negative for depression, memory loss, substance abuse and suicidal ideas.       Objective:    Physical Exam  Constitutional: He is oriented to person, place, and time. He appears well-developed and well-nourished. No distress.  HENT:  Head: Normocephalic and atraumatic.  Right Ear: External ear normal.  Left Ear: External ear normal.  Nose: Nose normal.  Mouth/Throat: Oropharynx is clear and moist. No oropharyngeal exudate.  Eyes: Conjunctivae and EOM are normal. Pupils are equal, round, and reactive to light. Right eye exhibits no discharge. Left eye exhibits no discharge.  Neck: Normal range of motion. Neck supple. No JVD present. No thyromegaly present.  Cardiovascular: Normal rate, regular rhythm and intact distal pulses.   No murmur heard. Pulmonary/Chest: Effort normal and breath sounds normal. No respiratory distress. He has no wheezes.  He has no rales. He exhibits no tenderness.  Abdominal: Soft. Bowel sounds are normal. He exhibits no distension and no mass. There is no tenderness. There is no rebound and no guarding.  Genitourinary: Prostate normal and penis normal.  Musculoskeletal: Normal range of motion. He exhibits no edema or tenderness.  Lymphadenopathy:    He has no cervical adenopathy.  Neurological: He is alert and oriented to person, place, and time. He displays normal reflexes. No cranial nerve deficit. He exhibits normal muscle tone.  Skin: Skin is warm and dry. No rash noted. He is not diaphoretic. No erythema.  Psychiatric: He has a normal mood and  affect. His behavior is normal. Judgment and thought content normal.  Nursing note and vitals reviewed.   BP 118/70 (BP Location: Left Arm, Patient Position: Sitting, Cuff Size: Large)   Pulse (!) 52   Temp 97.9 F (36.6 C) (Oral)   Resp 16   Ht 5\' 9"  (1.753 m)   Wt 222 lb 12.8 oz (101.1 kg)   SpO2 97%   BMI 32.90 kg/m  Wt Readings from Last 3 Encounters:  04/23/16 222 lb 12.8 oz (101.1 kg)  10/14/15 208 lb (94.3 kg)  10/09/15 208 lb (94.3 kg)     Lab Results  Component Value Date   WBC 6.1 04/23/2016   HGB 15.7 04/23/2016   HCT 46.8 04/23/2016   PLT 183 04/23/2016   GLUCOSE 98 10/15/2015   CHOL 115 09/01/2015   TRIG 53.0 09/01/2015   HDL 42.80 09/01/2015   LDLCALC 62 09/01/2015   ALT 15 09/01/2015   AST 13 09/01/2015   NA 140 10/15/2015   K 4.3 10/15/2015   CL 105 10/15/2015   CREATININE 0.90 10/15/2015   BUN 12 10/15/2015   CO2 27 10/15/2015   TSH 0.62 10/03/2015   PSA 0.79 09/01/2015   HGBA1C 5.5 03/05/2015   MICROALBUR 1.5 12/25/2012    Lab Results  Component Value Date   TSH 0.62 10/03/2015   Lab Results  Component Value Date   WBC 6.1 04/23/2016   HGB 15.7 04/23/2016   HCT 46.8 04/23/2016   MCV 91.9 04/23/2016   PLT 183 04/23/2016   Lab Results  Component Value Date   NA 140 10/15/2015   K 4.3 10/15/2015   CO2 27  10/15/2015   GLUCOSE 98 10/15/2015   BUN 12 10/15/2015   CREATININE 0.90 10/15/2015   BILITOT 0.8 09/01/2015   ALKPHOS 62 09/01/2015   AST 13 09/01/2015   ALT 15 09/01/2015   PROT 6.5 09/01/2015   ALBUMIN 4.3 09/01/2015   CALCIUM 9.0 10/15/2015   ANIONGAP 8 10/15/2015   GFR 100.32 09/01/2015   Lab Results  Component Value Date   CHOL 115 09/01/2015   Lab Results  Component Value Date   HDL 42.80 09/01/2015   Lab Results  Component Value Date   LDLCALC 62 09/01/2015   Lab Results  Component Value Date   TRIG 53.0 09/01/2015   Lab Results  Component Value Date   CHOLHDL 3 09/01/2015   Lab Results  Component Value Date   HGBA1C 5.5 03/05/2015       Assessment & Plan:   Problem List Items Addressed This Visit      Unprioritized   Hyperlipidemia   Relevant Orders   Lipid panel (Completed)   PSA   Pre-op evaluation - Primary    Pt cleared for surgery Form filled out and sent to ortho EKG-- no change Check labs Instructions per surgical team      Relevant Orders   EKG 12-Lead   PSA    Other Visit Diagnoses    Essential hypertension       Relevant Orders   Comprehensive metabolic panel (Completed)   CBC (Completed)   PSA   Prostate cancer screening       Relevant Orders   PSA   Encounter for Medicare annual wellness exam       Relevant Orders   PSA      I have discontinued Mr. Kostoff valsartan. I am also having him maintain his aspirin, Multiple Vitamin (MULTIVITAMIN PO), Coenzyme Q-10, trimethobenzamide, pantoprazole, tamsulosin, atorvastatin,  esomeprazole, loratadine, fluticasone, multivitamin, NONFORMULARY OR COMPOUNDED ITEM, ondansetron, baclofen, sennosides-docusate sodium, HYDROcodone-acetaminophen, and meloxicam.  No orders of the defined types were placed in this encounter.   CMA served as Education administrator during this visit. History, Physical and Plan performed by medical provider. Documentation and orders reviewed and attested to.   [name], DO

## 2016-04-23 NOTE — Progress Notes (Signed)
reviewed

## 2016-04-23 NOTE — Assessment & Plan Note (Addendum)
Pt cleared for surgery Form filled out and sent to ortho EKG-- no change Check labs Instructions per surgical team

## 2016-04-24 LAB — PSA: PSA: 0.9 ng/mL (ref <=4.0)

## 2016-04-28 NOTE — Telephone Encounter (Signed)
Advised pt at last ov for surgical clearance that provider recommend he has a sleep study done first. Pt state he understand and had no concerns at this time. LB

## 2016-04-30 DIAGNOSIS — E042 Nontoxic multinodular goiter: Secondary | ICD-10-CM | POA: Diagnosis not present

## 2016-04-30 DIAGNOSIS — E041 Nontoxic single thyroid nodule: Secondary | ICD-10-CM | POA: Diagnosis not present

## 2016-05-06 DIAGNOSIS — L570 Actinic keratosis: Secondary | ICD-10-CM | POA: Diagnosis not present

## 2016-05-07 DIAGNOSIS — E042 Nontoxic multinodular goiter: Secondary | ICD-10-CM | POA: Diagnosis not present

## 2016-05-10 ENCOUNTER — Other Ambulatory Visit: Payer: Self-pay | Admitting: Family Medicine

## 2016-05-13 NOTE — Pre-Procedure Instructions (Signed)
Grayer Szafran  05/13/2016      [facility] [place], [place] - [address] [place] [address] Phone: [phone] Fax: [phone]  [facility] DELIVERY - [place], [place] [address] [place] [address] Phone: [phone] Fax: [phone]    Your procedure is scheduled on  Tuesday  05/25/16  Report to [facility] Admitting at 530 A.M.  Call this number if you have problems the morning of surgery:  [phone]   Remember:  Do not eat food or drink liquids after midnight.  Take these medicines the morning of surgery with A SIP OF WATER  EYE DROPS, PATOPRAZOLE(PROTONIX), TAMSULOSIN (FLOMAX)    (STOP 7 DAYS PRIOR TO SURGERY-  ASPIRIN OR ASPIRIN PRODUCTS, IBUPROFEN/ ADVIL/ MOTRIN, CO Q 10, MELOXICAM/ MOBIC, MULTIVITAMIN, GOODY POWDERS, BC'S, HERBAL MEDICINES)    Do not wear jewelry, make-up or nail polish.  Do not wear lotions, powders, or perfumes, or deoderant.  Do not shave 48 hours prior to surgery.  Men may shave face and neck.  Do not bring valuables to the hospital.  [facility] is not responsible for any belongings or valuables.  Contacts, dentures or bridgework may not be worn into surgery.  Leave your suitcase in the car.  After surgery it may be brought to your room.  For patients admitted to the hospital, discharge time will be determined by your treatment team.  Patients discharged the day of surgery will not be allowed to drive home.   Name and phone number of your driver:    Special instructions:  [facility] - Preparing for Surgery  Before surgery, you can play an important role.  Because skin is not sterile, your skin needs to be as free of germs as possible.  You can reduce the number of germs on you skin by washing with CHG (chlorahexidine gluconate) soap before surgery.  CHG is an antiseptic cleaner which kills germs and bonds with the skin to continue killing germs  even after washing.  Please DO NOT use if you have an allergy to CHG or antibacterial soaps.  If your skin becomes reddened/irritated stop using the CHG and inform your nurse when you arrive at Short Stay.  Do not shave (including legs and underarms) for at least 48 hours prior to the first CHG shower.  You may shave your face.  Please follow these instructions carefully:   1.  Shower with CHG Soap the night before surgery and the                                morning of Surgery.  2.  If you choose to wash your hair, wash your hair first as usual with your       normal shampoo.  3.  After you shampoo, rinse your hair and body thoroughly to remove the                      Shampoo.  4.  Use CHG as you would any other liquid soap.  You can apply chg directly       to the skin and wash gently with scrungie or a clean washcloth.  5.  Apply the CHG Soap to your body ONLY FROM THE NECK DOWN.        Do not use on open  wounds or open sores.  Avoid contact with your eyes,       ears, mouth and genitals (private parts).  Wash genitals (private parts)       with your normal soap.  6.  Wash thoroughly, paying special attention to the area where your surgery        will be performed.  7.  Thoroughly rinse your body with warm water from the neck down.  8.  DO NOT shower/wash with your normal soap after using and rinsing off       the CHG Soap.  9.  Pat yourself dry with a clean towel.            10.  Wear clean pajamas.            11.  Place clean sheets on your bed the night of your first shower and do not        sleep with pets.  Day of Surgery  Do not apply any lotions/deoderants the morning of surgery.  Please wear clean clothes to the hospital/surgery center.    Please read over the following fact sheets that you were given. MRSA Information and Surgical Site Infection Prevention

## 2016-05-14 ENCOUNTER — Encounter (HOSPITAL_COMMUNITY)
Admission: RE | Admit: 2016-05-14 | Discharge: 2016-05-14 | Disposition: A | Discharge status: Home / Self Care | Payer: Medicare Other | Source: Ambulatory Visit | Attending: Orthopedic Surgery | Admitting: Orthopedic Surgery

## 2016-05-14 ENCOUNTER — Other Ambulatory Visit: Payer: Self-pay | Admitting: Orthopedic Surgery

## 2016-05-14 ENCOUNTER — Encounter (HOSPITAL_COMMUNITY): Payer: Self-pay

## 2016-05-14 DIAGNOSIS — Z01818 Encounter for other preprocedural examination: Secondary | ICD-10-CM | POA: Insufficient documentation

## 2016-05-14 HISTORY — DX: Essential (primary) hypertension: I10

## 2016-05-14 HISTORY — DX: Nontoxic single thyroid nodule: E04.1

## 2016-05-14 HISTORY — DX: Tremor, unspecified: R25.1

## 2016-05-14 LAB — BASIC METABOLIC PANEL
Anion gap: 7 (ref 5–15)
BUN: 12 mg/dL (ref 6–20)
CO2: 24 mmol/L (ref 22–32)
Calcium: 9.2 mg/dL (ref 8.9–10.3)
Chloride: 110 mmol/L (ref 101–111)
Creatinine, Ser: 0.91 mg/dL (ref 0.61–1.24)
GFR calc Af Amer: >60 mL/min (ref >60)
GFR calc non Af Amer: >60 mL/min (ref >60)
Glucose, Bld: 89 mg/dL (ref 65–99)
Potassium: 4.3 mmol/L (ref 3.5–5.1)
Sodium: 141 mmol/L (ref 135–145)

## 2016-05-14 LAB — CBC
HCT: 43.3 % (ref 39.0–52.0)
Hemoglobin: 14.5 g/dL (ref 13.0–17.0)
MCH: 30.6 pg (ref 26.0–34.0)
MCHC: 33.5 g/dL (ref 30.0–36.0)
MCV: 91.4 fL (ref 78.0–100.0)
Platelets: 159 10*3/uL (ref 150–400)
RBC: 4.74 MIL/uL (ref 4.22–5.81)
RDW: 12.9 % (ref 11.5–15.5)
WBC: 6.5 10*3/uL (ref 4.0–10.5)

## 2016-05-14 LAB — SURGICAL PCR SCREEN
MRSA, PCR: NEGATIVE
Staphylococcus aureus: NEGATIVE

## 2016-05-25 ENCOUNTER — Inpatient Hospital Stay (HOSPITAL_COMMUNITY)
Admission: RE | Admit: 2016-05-25 | Discharge: 2016-05-26 | DRG: 483 | Disposition: A | Discharge status: Home / Self Care | Payer: Medicare Other | Source: Ambulatory Visit | Attending: Orthopedic Surgery | Admitting: Orthopedic Surgery

## 2016-05-25 ENCOUNTER — Encounter (HOSPITAL_COMMUNITY)
Admission: RE | Disposition: A | Discharge status: Home / Self Care | Payer: Self-pay | Source: Ambulatory Visit | Attending: Orthopedic Surgery

## 2016-05-25 ENCOUNTER — Ambulatory Visit (HOSPITAL_COMMUNITY): Payer: Medicare Other | Admitting: Anesthesiology

## 2016-05-25 ENCOUNTER — Ambulatory Visit (HOSPITAL_COMMUNITY): Payer: Medicare Other

## 2016-05-25 ENCOUNTER — Encounter (HOSPITAL_COMMUNITY): Payer: Self-pay | Admitting: *Deleted

## 2016-05-25 DIAGNOSIS — Z7982 Long term (current) use of aspirin: Secondary | ICD-10-CM

## 2016-05-25 DIAGNOSIS — I10 Essential (primary) hypertension: Secondary | ICD-10-CM | POA: Diagnosis present

## 2016-05-25 DIAGNOSIS — E785 Hyperlipidemia, unspecified: Secondary | ICD-10-CM | POA: Diagnosis present

## 2016-05-25 DIAGNOSIS — K219 Gastro-esophageal reflux disease without esophagitis: Secondary | ICD-10-CM | POA: Diagnosis not present

## 2016-05-25 DIAGNOSIS — M19011 Primary osteoarthritis, right shoulder: Principal | ICD-10-CM | POA: Diagnosis present

## 2016-05-25 DIAGNOSIS — Z888 Allergy status to other drugs, medicaments and biological substances status: Secondary | ICD-10-CM

## 2016-05-25 DIAGNOSIS — G8918 Other acute postprocedural pain: Secondary | ICD-10-CM | POA: Diagnosis not present

## 2016-05-25 DIAGNOSIS — M24511 Contracture, right shoulder: Secondary | ICD-10-CM | POA: Diagnosis present

## 2016-05-25 DIAGNOSIS — Z8249 Family history of ischemic heart disease and other diseases of the circulatory system: Secondary | ICD-10-CM

## 2016-05-25 DIAGNOSIS — Z803 Family history of malignant neoplasm of breast: Secondary | ICD-10-CM

## 2016-05-25 DIAGNOSIS — Z8261 Family history of arthritis: Secondary | ICD-10-CM | POA: Diagnosis not present

## 2016-05-25 DIAGNOSIS — Z96611 Presence of right artificial shoulder joint: Secondary | ICD-10-CM | POA: Insufficient documentation

## 2016-05-25 DIAGNOSIS — G4730 Sleep apnea, unspecified: Secondary | ICD-10-CM | POA: Diagnosis present

## 2016-05-25 DIAGNOSIS — Z96619 Presence of unspecified artificial shoulder joint: Secondary | ICD-10-CM

## 2016-05-25 DIAGNOSIS — Z8042 Family history of malignant neoplasm of prostate: Secondary | ICD-10-CM | POA: Diagnosis not present

## 2016-05-25 HISTORY — PX: TOTAL SHOULDER ARTHROPLASTY: SHX126

## 2016-05-25 HISTORY — DX: Primary osteoarthritis, right shoulder: M19.011

## 2016-05-25 SURGERY — ARTHROPLASTY, SHOULDER, TOTAL
Anesthesia: Regional | Site: Shoulder | Laterality: Right

## 2016-05-25 MED ORDER — ADULT MULTIVITAMIN W/MINERALS CH
1.0000 | ORAL_TABLET | Freq: Every day | ORAL | Status: DC
  Administered 2016-05-25: 1 via ORAL
  Filled 2016-05-25 (×2): qty 1

## 2016-05-25 MED ORDER — HYDROMORPHONE HCL 1 MG/ML IJ SOLN: 0.2500 mg | INTRAMUSCULAR | Status: DC | PRN

## 2016-05-25 MED ORDER — ACETAMINOPHEN 650 MG RE SUPP: 650.0000 mg | Freq: Four times a day (QID) | RECTAL | Status: DC | PRN

## 2016-05-25 MED ORDER — NAPHAZOLINE-PHENIRAMINE 0.025-0.3 % OP SOLN
2.0000 [drp] | Freq: Three times a day (TID) | OPHTHALMIC | Status: DC | PRN
  Filled 2016-05-25: qty 5

## 2016-05-25 MED ORDER — ACETAMINOPHEN 325 MG PO TABS
650.0000 mg | ORAL_TABLET | Freq: Four times a day (QID) | ORAL | Status: DC | PRN
  Administered 2016-05-25: 650 mg via ORAL
  Filled 2016-05-25: qty 2

## 2016-05-25 MED ORDER — PROPOFOL 10 MG/ML IV BOLUS
INTRAVENOUS | Status: DC | PRN
Start: 2016-05-25 — End: 2016-05-25
  Administered 2016-05-25: 140 mg via INTRAVENOUS

## 2016-05-25 MED ORDER — ONDANSETRON HCL 4 MG PO TABS: 4.0000 mg | ORAL_TABLET | Freq: Three times a day (TID) | ORAL | 0 refills | Status: DC | PRN

## 2016-05-25 MED ORDER — SUGAMMADEX SODIUM 200 MG/2ML IV SOLN
INTRAVENOUS | Status: AC
  Filled 2016-05-25: qty 2

## 2016-05-25 MED ORDER — SENNA-DOCUSATE SODIUM 8.6-50 MG PO TABS: 2.0000 | ORAL_TABLET | Freq: Every day | ORAL | 1 refills | Status: DC

## 2016-05-25 MED ORDER — EPHEDRINE 5 MG/ML INJ
INTRAVENOUS | Status: AC
  Filled 2016-05-25: qty 10

## 2016-05-25 MED ORDER — COQ10 200 MG PO CAPS: 200.0000 mg | ORAL_CAPSULE | Freq: Every day | ORAL | Status: DC

## 2016-05-25 MED ORDER — ZOLPIDEM TARTRATE 5 MG PO TABS: 5.0000 mg | ORAL_TABLET | Freq: Every evening | ORAL | Status: DC | PRN

## 2016-05-25 MED ORDER — ATORVASTATIN CALCIUM 40 MG PO TABS
40.0000 mg | ORAL_TABLET | Freq: Every day | ORAL | Status: DC
  Administered 2016-05-25: 40 mg via ORAL
  Filled 2016-05-25 (×2): qty 1

## 2016-05-25 MED ORDER — FENTANYL CITRATE (PF) 100 MCG/2ML IJ SOLN
INTRAMUSCULAR | Status: AC
  Filled 2016-05-25: qty 2

## 2016-05-25 MED ORDER — MUPIROCIN 2 % EX OINT
1.0000 "application " | TOPICAL_OINTMENT | Freq: Two times a day (BID) | CUTANEOUS | Status: DC | PRN
  Filled 2016-05-25: qty 22

## 2016-05-25 MED ORDER — PROMETHAZINE HCL 25 MG/ML IJ SOLN: 6.2500 mg | INTRAMUSCULAR | Status: DC | PRN

## 2016-05-25 MED ORDER — ROCURONIUM BROMIDE 100 MG/10ML IV SOLN
INTRAVENOUS | Status: DC | PRN
  Administered 2016-05-25: 50 mg via INTRAVENOUS

## 2016-05-25 MED ORDER — BACLOFEN 10 MG PO TABS: 10.0000 mg | ORAL_TABLET | Freq: Three times a day (TID) | ORAL | 0 refills | Status: DC

## 2016-05-25 MED ORDER — ROCURONIUM BROMIDE 50 MG/5ML IV SOSY
PREFILLED_SYRINGE | INTRAVENOUS | Status: AC
  Filled 2016-05-25: qty 5

## 2016-05-25 MED ORDER — MEPERIDINE HCL 25 MG/ML IJ SOLN: 6.2500 mg | INTRAMUSCULAR | Status: DC | PRN

## 2016-05-25 MED ORDER — BISACODYL 10 MG RE SUPP: 10.0000 mg | Freq: Every day | RECTAL | Status: DC | PRN

## 2016-05-25 MED ORDER — HYDROCODONE-ACETAMINOPHEN 10-325 MG PO TABS: 1.0000 | ORAL_TABLET | Freq: Four times a day (QID) | ORAL | 0 refills | Status: DC | PRN

## 2016-05-25 MED ORDER — 0.9 % SODIUM CHLORIDE (POUR BTL) OPTIME
TOPICAL | Status: DC | PRN
  Administered 2016-05-25: 1000 mL

## 2016-05-25 MED ORDER — MAGNESIUM CITRATE PO SOLN: 1.0000 | Freq: Once | ORAL | Status: DC | PRN

## 2016-05-25 MED ORDER — PHENYLEPHRINE HCL 10 MG/ML IJ SOLN
INTRAVENOUS | Status: DC | PRN
  Administered 2016-05-25: 25 ug/min via INTRAVENOUS

## 2016-05-25 MED ORDER — MENTHOL 3 MG MT LOZG: 1.0000 | LOZENGE | OROMUCOSAL | Status: DC | PRN

## 2016-05-25 MED ORDER — FLUTICASONE PROPIONATE 50 MCG/ACT NA SUSP
1.0000 | Freq: Every day | NASAL | Status: DC | PRN
  Filled 2016-05-25: qty 16

## 2016-05-25 MED ORDER — POLYETHYLENE GLYCOL 3350 17 G PO PACK: 17.0000 g | PACK | Freq: Every day | ORAL | Status: DC | PRN

## 2016-05-25 MED ORDER — LACTATED RINGERS IV SOLN
INTRAVENOUS | Status: DC | PRN
  Administered 2016-05-25 (×2): via INTRAVENOUS

## 2016-05-25 MED ORDER — METOCLOPRAMIDE HCL 5 MG/ML IJ SOLN: 5.0000 mg | Freq: Three times a day (TID) | INTRAMUSCULAR | Status: DC | PRN

## 2016-05-25 MED ORDER — ONDANSETRON HCL 4 MG/2ML IJ SOLN
INTRAMUSCULAR | Status: AC
  Filled 2016-05-25: qty 2

## 2016-05-25 MED ORDER — LIDOCAINE HCL (CARDIAC) 20 MG/ML IV SOLN
INTRAVENOUS | Status: DC | PRN
Start: 2016-05-25 — End: 2016-05-25
  Administered 2016-05-25: 80 mg via INTRAVENOUS

## 2016-05-25 MED ORDER — ONDANSETRON HCL 4 MG PO TABS: 4.0000 mg | ORAL_TABLET | Freq: Four times a day (QID) | ORAL | Status: DC | PRN

## 2016-05-25 MED ORDER — PHENOL 1.4 % MT LIQD: 1.0000 | OROMUCOSAL | Status: DC | PRN

## 2016-05-25 MED ORDER — METOCLOPRAMIDE HCL 5 MG PO TABS: 5.0000 mg | ORAL_TABLET | Freq: Three times a day (TID) | ORAL | Status: DC | PRN

## 2016-05-25 MED ORDER — METHOCARBAMOL 1000 MG/10ML IJ SOLN
500.0000 mg | Freq: Four times a day (QID) | INTRAVENOUS | Status: DC | PRN
  Filled 2016-05-25: qty 5

## 2016-05-25 MED ORDER — METHOCARBAMOL 500 MG PO TABS
500.0000 mg | ORAL_TABLET | Freq: Four times a day (QID) | ORAL | Status: DC | PRN
  Administered 2016-05-25 – 2016-05-26 (×3): 500 mg via ORAL
  Filled 2016-05-25 (×3): qty 1

## 2016-05-25 MED ORDER — HYDROMORPHONE HCL 2 MG/ML IJ SOLN
0.5000 mg | INTRAMUSCULAR | Status: DC | PRN
  Administered 2016-05-26: 0.5 mg via INTRAVENOUS
  Filled 2016-05-25: qty 1

## 2016-05-25 MED ORDER — ONDANSETRON HCL 4 MG/2ML IJ SOLN: 4.0000 mg | Freq: Four times a day (QID) | INTRAMUSCULAR | Status: DC | PRN

## 2016-05-25 MED ORDER — PROPOFOL 10 MG/ML IV BOLUS
INTRAVENOUS | Status: AC
  Filled 2016-05-25: qty 20

## 2016-05-25 MED ORDER — TAMSULOSIN HCL 0.4 MG PO CAPS
0.4000 mg | ORAL_CAPSULE | Freq: Every day | ORAL | Status: DC
  Administered 2016-05-25: 0.4 mg via ORAL
  Filled 2016-05-25 (×2): qty 1

## 2016-05-25 MED ORDER — MIDAZOLAM HCL 5 MG/5ML IJ SOLN
INTRAMUSCULAR | Status: DC | PRN
  Administered 2016-05-25: 2 mg via INTRAVENOUS

## 2016-05-25 MED ORDER — SUGAMMADEX SODIUM 200 MG/2ML IV SOLN
INTRAVENOUS | Status: DC | PRN
  Administered 2016-05-25: 200 mg via INTRAVENOUS

## 2016-05-25 MED ORDER — SENNA 8.6 MG PO TABS
1.0000 | ORAL_TABLET | Freq: Two times a day (BID) | ORAL | Status: DC
  Administered 2016-05-25 – 2016-05-26 (×3): 8.6 mg via ORAL
  Filled 2016-05-25 (×3): qty 1

## 2016-05-25 MED ORDER — CEFAZOLIN SODIUM-DEXTROSE 2-4 GM/100ML-% IV SOLN
2.0000 g | INTRAVENOUS | Status: AC
  Administered 2016-05-25: 2 g via INTRAVENOUS
  Filled 2016-05-25: qty 100

## 2016-05-25 MED ORDER — ONDANSETRON HCL 4 MG/2ML IJ SOLN
INTRAMUSCULAR | Status: DC | PRN
Start: 2016-05-25 — End: 2016-05-25
  Administered 2016-05-25: 4 mg via INTRAVENOUS

## 2016-05-25 MED ORDER — MIDAZOLAM HCL 2 MG/2ML IJ SOLN
INTRAMUSCULAR | Status: AC
  Filled 2016-05-25: qty 2

## 2016-05-25 MED ORDER — LIDOCAINE 2% (20 MG/ML) 5 ML SYRINGE
INTRAMUSCULAR | Status: AC
  Filled 2016-05-25: qty 5

## 2016-05-25 MED ORDER — DIPHENHYDRAMINE HCL 12.5 MG/5ML PO ELIX: 12.5000 mg | ORAL_SOLUTION | ORAL | Status: DC | PRN

## 2016-05-25 MED ORDER — HYDROCODONE-ACETAMINOPHEN 10-325 MG PO TABS
1.0000 | ORAL_TABLET | Freq: Four times a day (QID) | ORAL | Status: DC | PRN
  Administered 2016-05-25 – 2016-05-26 (×2): 2 via ORAL
  Filled 2016-05-25 (×3): qty 2

## 2016-05-25 MED ORDER — OXYCODONE HCL 5 MG PO TABS: 5.0000 mg | ORAL_TABLET | Freq: Once | ORAL | Status: DC | PRN

## 2016-05-25 MED ORDER — GLYCOPYRROLATE 0.2 MG/ML IJ SOLN
INTRAMUSCULAR | Status: DC | PRN
  Administered 2016-05-25: 0.2 mg via INTRAVENOUS

## 2016-05-25 MED ORDER — FENTANYL CITRATE (PF) 100 MCG/2ML IJ SOLN
INTRAMUSCULAR | Status: DC | PRN
  Administered 2016-05-25 (×2): 50 ug via INTRAVENOUS

## 2016-05-25 MED ORDER — EPHEDRINE SULFATE-NACL 50-0.9 MG/10ML-% IV SOSY
PREFILLED_SYRINGE | INTRAVENOUS | Status: DC | PRN
  Administered 2016-05-25: 10 mg via INTRAVENOUS
  Administered 2016-05-25: 5 mg via INTRAVENOUS
  Administered 2016-05-25: 10 mg via INTRAVENOUS
  Administered 2016-05-25 (×2): 5 mg via INTRAVENOUS

## 2016-05-25 MED ORDER — ASPIRIN EC 81 MG PO TBEC
81.0000 mg | DELAYED_RELEASE_TABLET | Freq: Every day | ORAL | Status: DC
  Filled 2016-05-25: qty 1

## 2016-05-25 MED ORDER — OXYCODONE HCL 5 MG/5ML PO SOLN: 5.0000 mg | Freq: Once | ORAL | Status: DC | PRN

## 2016-05-25 MED ORDER — DOCUSATE SODIUM 100 MG PO CAPS
100.0000 mg | ORAL_CAPSULE | Freq: Two times a day (BID) | ORAL | Status: DC
  Administered 2016-05-25 – 2016-05-26 (×3): 100 mg via ORAL
  Filled 2016-05-25 (×3): qty 1

## 2016-05-25 MED ORDER — TRIMETHOBENZAMIDE HCL 300 MG PO CAPS: 300.0000 mg | ORAL_CAPSULE | Freq: Every day | ORAL | Status: DC | PRN

## 2016-05-25 MED ORDER — PANTOPRAZOLE SODIUM 40 MG PO TBEC
40.0000 mg | DELAYED_RELEASE_TABLET | Freq: Every day | ORAL | Status: DC
  Filled 2016-05-25 (×2): qty 1

## 2016-05-25 MED ORDER — POTASSIUM CHLORIDE IN NACL 20-0.45 MEQ/L-% IV SOLN
INTRAVENOUS | Status: DC
  Administered 2016-05-25: 75 mL/h via INTRAVENOUS
  Filled 2016-05-25 (×2): qty 1000

## 2016-05-25 MED ORDER — CEFAZOLIN IN D5W 1 GM/50ML IV SOLN
1.0000 g | Freq: Four times a day (QID) | INTRAVENOUS | Status: AC
  Administered 2016-05-25 – 2016-05-26 (×3): 1 g via INTRAVENOUS
  Filled 2016-05-25 (×3): qty 50

## 2016-05-25 MED ORDER — ALUM & MAG HYDROXIDE-SIMETH 200-200-20 MG/5ML PO SUSP: 30.0000 mL | ORAL | Status: DC | PRN

## 2016-05-25 MED ORDER — LORATADINE 10 MG PO TABS
10.0000 mg | ORAL_TABLET | Freq: Every day | ORAL | Status: DC
  Filled 2016-05-25 (×2): qty 1

## 2016-05-25 SURGICAL SUPPLY — 49 items
BLADE SAW SGTL MED 73X18.5 STR (BLADE) ×2 IMPLANT
CAPT SHLDR TOTAL 2 ×2 IMPLANT
CEMENT BONE DEPUY (Cement) ×2 IMPLANT
CLSR STERI-STRIP ANTIMIC 1/2X4 (GAUZE/BANDAGES/DRESSINGS) ×2 IMPLANT
COVER SURGICAL LIGHT HANDLE (MISCELLANEOUS) ×2 IMPLANT
DRAPE ORTHO SPLIT 77X108 STRL (DRAPES) ×2
DRAPE PROXIMA HALF (DRAPES) ×2 IMPLANT
DRAPE SURG ORHT 6 SPLT 77X108 (DRAPES) ×2 IMPLANT
DRAPE U-SHAPE 47X51 STRL (DRAPES) ×2 IMPLANT
DRSG MEPILEX BORDER 4X8 (GAUZE/BANDAGES/DRESSINGS) ×2 IMPLANT
DURAPREP 26ML APPLICATOR (WOUND CARE) ×2 IMPLANT
ELECT REM PT RETURN 9FT ADLT (ELECTROSURGICAL) ×2
ELECTRODE REM PT RTRN 9FT ADLT (ELECTROSURGICAL) ×1 IMPLANT
GLOVE BIOGEL PI ORTHO PRO SZ8 (GLOVE) ×2
GLOVE ORTHO TXT STRL SZ7.5 (GLOVE) ×2 IMPLANT
GLOVE PI ORTHO PRO STRL SZ8 (GLOVE) ×2 IMPLANT
GLOVE SURG ORTHO 8.0 STRL STRW (GLOVE) ×2 IMPLANT
GOWN STRL REUS W/ TWL XL LVL3 (GOWN DISPOSABLE) ×1 IMPLANT
GOWN STRL REUS W/TWL 2XL LVL3 (GOWN DISPOSABLE) ×2 IMPLANT
GOWN STRL REUS W/TWL XL LVL3 (GOWN DISPOSABLE) ×1
HANDPIECE INTERPULSE COAX TIP (DISPOSABLE) ×1
HOOD PEEL AWAY FACE SHEILD DIS (HOOD) ×4 IMPLANT
KIT BASIN OR (CUSTOM PROCEDURE TRAY) ×2 IMPLANT
KIT ROOM TURNOVER OR (KITS) ×2 IMPLANT
MANIFOLD NEPTUNE II (INSTRUMENTS) ×2 IMPLANT
NS IRRIG 1000ML POUR BTL (IV SOLUTION) ×2 IMPLANT
PACK SHOULDER (CUSTOM PROCEDURE TRAY) ×2 IMPLANT
PAD ARMBOARD 7.5X6 YLW CONV (MISCELLANEOUS) ×4 IMPLANT
PIN HUMERAL STMN 3.2MMX9IN (INSTRUMENTS) ×2 IMPLANT
SET HNDPC FAN SPRY TIP SCT (DISPOSABLE) ×1 IMPLANT
SLING ARM IMMOBILIZER LRG (SOFTGOODS) IMPLANT
SLING ARM IMMOBILIZER MED (SOFTGOODS) IMPLANT
SMARTMIX MINI TOWER (MISCELLANEOUS)
SUCTION FRAZIER HANDLE 10FR (MISCELLANEOUS) ×1
SUCTION TUBE FRAZIER 10FR DISP (MISCELLANEOUS) ×1 IMPLANT
SUPPORT WRAP ARM LG (MISCELLANEOUS) ×2 IMPLANT
SUT FIBERWIRE #2 38 REV NDL BL (SUTURE) ×14
SUT MNCRL AB 4-0 PS2 18 (SUTURE) IMPLANT
SUT VIC AB 0 CT1 27 (SUTURE) ×1
SUT VIC AB 0 CT1 27XBRD ANBCTR (SUTURE) ×1 IMPLANT
SUT VIC AB 2-0 CT1 27 (SUTURE) ×1
SUT VIC AB 2-0 CT1 TAPERPNT 27 (SUTURE) ×1 IMPLANT
SUT VIC AB 3-0 SH 8-18 (SUTURE) ×2 IMPLANT
SUTURE FIBERWR#2 38 REV NDL BL (SUTURE) ×7 IMPLANT
TOWEL OR 17X24 6PK STRL BLUE (TOWEL DISPOSABLE) ×2 IMPLANT
TOWEL OR 17X26 10 PK STRL BLUE (TOWEL DISPOSABLE) ×2 IMPLANT
TOWER SMARTMIX MINI (MISCELLANEOUS) IMPLANT
TUBE CONNECTING 12X1/4 (SUCTIONS) IMPLANT
YANKAUER SUCT BULB TIP NO VENT (SUCTIONS) ×2 IMPLANT

## 2016-05-25 NOTE — Anesthesia Procedure Notes (Signed)
Anesthesia Regional Block: Interscalene brachial plexus block   Pre-Anesthetic Checklist: ,, timeout performed, Correct Patient, Correct Site, Correct Laterality, Correct Procedure, Correct Position, site marked, Risks and benefits discussed, pre-op evaluation,  At surgeon's request and post-op pain management  Laterality: Right  Prep: Dura Prep       Needles:  Injection technique: Single-shot  Needle Type: Echogenic Needle     Needle Length: 9cm  Needle Gauge: 21     Additional Needles:   Procedures: ultrasound guided,,,,,,,,  Narrative:  Start time: 05/25/2016 7:10 AM End time: 05/25/2016 7:14 AM Injection made incrementally with aspirations every 5 mL. Anesthesiologist: Candida Peeling RAY

## 2016-05-25 NOTE — Discharge Instructions (Signed)

## 2016-05-25 NOTE — Anesthesia Postprocedure Evaluation (Signed)
Anesthesia Post Note  Patient: Eric [name]  Procedure(s) Performed: Procedure(s) (LRB): TOTAL SHOULDER ARTHROPLASTY (Right)  Patient location during evaluation: PACU Anesthesia Type: Regional and General Level of consciousness: awake and alert Pain management: pain level controlled Vital Signs Assessment: post-procedure vital signs reviewed and stable Respiratory status: spontaneous breathing, nonlabored ventilation, respiratory function stable and patient connected to nasal cannula oxygen Cardiovascular status: blood pressure returned to baseline and stable Postop Assessment: no signs of nausea or vomiting Anesthetic complications: no       Last Vitals:  Vitals:   05/25/16 1109 05/25/16 1143  BP: 134/69 (!) 148/85  Pulse: 81 93  Resp: 18   Temp:  36.3 C    Last Pain:  Vitals:   05/25/16 1143  TempSrc: Axillary  PainSc:                  Lynda Rainwater

## 2016-05-25 NOTE — Anesthesia Procedure Notes (Signed)
Procedure Name: Intubation Date/Time: 05/25/2016 7:42 AM Performed by: Kyung Rudd Pre-anesthesia Checklist: Patient identified, Emergency Drugs available, Suction available and Patient being monitored Patient Re-evaluated:Patient Re-evaluated prior to inductionOxygen Delivery Method: Circle system utilized Preoxygenation: Pre-oxygenation with 100% oxygen Intubation Type: IV induction Ventilation: Mask ventilation without difficulty Laryngoscope Size: Mac and 3 Grade View: Grade III Tube type: Oral Tube size: 7.5 mm Number of attempts: 1 Airway Equipment and Method: Stylet Placement Confirmation: ETT inserted through vocal cords under direct vision,  positive ETCO2 and breath sounds checked- equal and bilateral Secured at: 22 cm Tube secured with: Tape Dental Injury: Teeth and Oropharynx as per pre-operative assessment

## 2016-05-25 NOTE — H&P (Signed)
PREOPERATIVE H&P  Chief Complaint: OA RIGHT SHOULDER  HPI: Eric [name] is a 71 y.o. male who presents for preoperative history and physical with a diagnosis of OA RIGHT SHOULDER. Symptoms are rated as moderate to severe, and have been worsening.  This is significantly impairing activities of daily living.  He has elected for surgical management.   He has failed injections, activity modification, anti-inflammatories, and assistive devices.  Preoperative X-rays demonstrate end stage degenerative changes with osteophyte formation, loss of joint space, subchondral sclerosis.  Had the other side done and is happy with the results.    Past Medical History:  Diagnosis Date  . Colon polyps   . Diverticulosis of colon   . Dysrhythmia    Palpations from caffiene  . GERD (gastroesophageal reflux disease)   . History of hay fever   . Hx of cardiovascular stress test    Lexiscan Myoview (04/2013):  No ischemia, EF 61%; low risk  . Hyperlipemia   . Hypertension   . Internal hemorrhoids without mention of complication   . Osteoarthrosis, unspecified whether generalized or localized, unspecified site   . 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 Recent weight loss    75lbs  . Seasonal allergies   . Sleep apnea    CPAP  . Thyroid nodule   . Tremor of both hands    Past Surgical History:  Procedure Laterality Date  . COLONOSCOPY    . SHOULDER SURGERY  Jul 06, 2007   right  . TONSILLECTOMY    . TOTAL SHOULDER ARTHROPLASTY Left 10/14/2015   Procedure: LEFT TOTAL SHOULDER ARTHROPLASTY;  Surgeon: Marchia Bond, MD;  Location: [place];  Service: Orthopedics;  Laterality: Left;   Social History   Social History  . Marital status: Married    Spouse name: N/A  . Number of children: 3  . Years of education: N/A   Occupational History  . Retired--[employer]    Social History Main Topics  . Smoking status: Never Smoker  . Smokeless tobacco: Never Used  . Alcohol use  0.0 oz/week     Comment: rare----12 a year  . Drug use: No  . Sexual activity: Yes    Partners: Female   Other Topics Concern  . None   Social History Narrative   Lives with wife and daughter and two grandchildren.    Family History  Problem Relation Age of Onset  . Arthritis Mother   . Cancer Mother 98    breast  . Hyperlipidemia Mother   . Colon polyps Mother 20  . Heart disease Father     CAD--passed away Jul 06, 2011 age 41  . Hyperlipidemia Father   . Hypertension Father   . Cancer Sister 54    breast  . Cancer Brother 91    prostate  . Breast cancer    . Prostate cancer    . Colon cancer Neg Hx    Allergies  Allergen Reactions  . Lisinopril Cough   Prior to Admission medications   Medication Sig Start Date End Date Taking? Authorizing Provider  acetaminophen (TYLENOL) 500 MG tablet Take 1,000 mg by mouth every 6 (six) hours as needed (for pain.).   Yes Historical Provider, MD  aspirin 81 MG EC tablet Take 81 mg by mouth at bedtime.    Yes Historical Provider, MD  atorvastatin (LIPITOR) 40 MG tablet TAKE 1 TABLET DAILY Patient taking differently: TAKE 1 TABLET (40 MG ) BY MOUTH AT BEDTIME. 09/17/15  Yes [name],  DO  Coenzyme Q10 (COQ10) 200 MG CAPS Take 200 mg by mouth at bedtime.   Yes Historical Provider, MD  fluticasone (FLONASE) 50 MCG/ACT nasal spray Place 1 spray into both nostrils daily as needed for allergies or rhinitis.   Yes Historical Provider, MD  loratadine (CLARITIN) 10 MG tablet Take 10 mg by mouth daily.   Yes Historical Provider, MD  meloxicam (MOBIC) 15 MG tablet TAKE 1 TABLET DAILY 05/10/16  Yes Rosalita Chessman Chase, DO  Multiple Vitamin (MULTIVITAMIN WITH MINERALS) TABS tablet Take 1 tablet by mouth daily.   Yes Historical Provider, MD  Naphazoline-Pheniramine (ALLERGY EYE OP) Place 1-2 drops into both eyes 3 (three) times daily as needed (for dry/itching).   Yes Historical Provider, MD  NONFORMULARY OR COMPOUNDED ITEM apap cpap  maching--auto adjusting pressure  dx sleep apnea 10/09/15  Yes Yvonne R Lowne Chase, DO  pantoprazole (PROTONIX) 40 MG tablet Take 1 tablet (40 mg total) by mouth daily. 09/01/15  Yes Yvonne R Lowne Chase, DO  tamsulosin (FLOMAX) 0.4 MG CAPS capsule Take 1 capsule (0.4 mg total) by mouth daily. Patient taking differently: Take 0.4 mg by mouth at bedtime.  09/01/15  Yes Yvonne R Lowne Chase, DO  mupirocin ointment (BACTROBAN) 2 % Place 1 application into the nose 2 (two) times daily as needed (for skin tear/irritation.).    Historical Provider, MD  trimethobenzamide (TIGAN) 300 MG capsule Take 1 capsule (300 mg total) by mouth as needed. Patient taking differently: Take 300 mg by mouth daily as needed for nausea/vomiting.  02/11/14   Rosalita Chessman Chase, DO     Positive ROS: All other systems have been reviewed and were otherwise negative with the exception of those mentioned in the HPI and as above.  Physical Exam: General: Alert, no acute distress Cardiovascular: No pedal edema Respiratory: No cyanosis, no use of accessory musculature GI: No organomegaly, abdomen is soft and non-tender Skin: No lesions in the area of chief complaint Neurologic: Sensation intact distally Psychiatric: Patient is competent for consent with normal mood and affect Lymphatic: No axillary or cervical lymphadenopathy  MUSCULOSKELETAL: right shoulder AROM 0-120, ER 0, positive crepitance and painful arc.  Assessment: OA RIGHT SHOULDER   Plan: Plan for Procedure(s): TOTAL SHOULDER ARTHROPLASTY  The risks benefits and alternatives were discussed with the patient including but not limited to the risks of nonoperative treatment, versus surgical intervention including infection, bleeding, nerve injury,  blood clots, cardiopulmonary complications, morbidity, mortality, among others, and they were willing to proceed.   Johnny Bridge, MD Cell [phone]   05/25/2016 7:23 AM

## 2016-05-25 NOTE — Transfer of Care (Signed)
Immediate Anesthesia Transfer of Care Note  Patient: Eric [name]  Procedure(s) Performed: Procedure(s): TOTAL SHOULDER ARTHROPLASTY (Right)  Patient Location: PACU  Anesthesia Type:GA combined with regional for post-op pain  Level of Consciousness: awake, alert  and oriented  Airway & Oxygen Therapy: Patient Spontanous Breathing and Patient connected to nasal cannula oxygen  Post-op Assessment: Report given to RN, Post -op Vital signs reviewed and stable and Patient moving all extremities  Post vital signs: Reviewed and stable  Last Vitals:  Vitals:   05/25/16 0609 05/25/16 1025  BP: 137/66 (!) 118/48  Pulse: (!) 52 79  Resp: 20 (!) 24  Temp: 36.8 C     Last Pain:  Vitals:   05/25/16 0649  TempSrc:   PainSc: 3       Patients Stated Pain Goal: 3 ([id number])  Complications: No apparent anesthesia complications

## 2016-05-25 NOTE — Anesthesia Preprocedure Evaluation (Addendum)
Anesthesia Evaluation  Patient identified by MRN, date of birth, ID band Patient awake    Reviewed: Allergy & Precautions, NPO status , Patient's Chart, lab work & pertinent test results  Airway Mallampati: II  TM Distance: >3 FB Neck ROM: Full    Dental no notable dental hx. (+) Teeth Intact   Pulmonary neg pulmonary ROS,    Pulmonary exam normal breath sounds clear to auscultation       Cardiovascular hypertension, Pt. on medications negative cardio ROS Normal cardiovascular exam Rhythm:Regular Rate:Normal     Neuro/Psych negative neurological ROS  negative psych ROS   GI/Hepatic negative GI ROS, Neg liver ROS, GERD  Medicated and Controlled,  Endo/Other  negative endocrine ROS  Renal/GU negative Renal ROS  negative genitourinary   Musculoskeletal negative musculoskeletal ROS (+)   Abdominal   Peds negative pediatric ROS (+)  Hematology negative hematology ROS (+)   Anesthesia Other Findings   Reproductive/Obstetrics negative OB ROS                            Anesthesia Physical Anesthesia Plan  ASA: II  Anesthesia Plan: General and Regional   Post-op Pain Management: GA combined w/ Regional for post-op pain   Induction: Intravenous  Airway Management Planned: Oral ETT  Additional Equipment:   Intra-op Plan:   Post-operative Plan: Extubation in OR  Informed Consent: I have reviewed the patients History and Physical, chart, labs and discussed the procedure including the risks, benefits and alternatives for the proposed anesthesia with the patient or authorized representative who has indicated his/her understanding and acceptance.   Dental advisory given  Plan Discussed with: CRNA and Surgeon  Anesthesia Plan Comments:         Anesthesia Quick Evaluation

## 2016-05-25 NOTE — Progress Notes (Signed)
Orthopedic Tech Progress Note Patient Details:  Rashi Prevot [birth year] [id number] Patient already has sling. Patient ID: Quention Scura, male   DOB: April 07, 1945, 71 y.o.   MRN: [id number]   Braulio Bosch 05/25/2016, 2:25 PM

## 2016-05-25 NOTE — Progress Notes (Signed)
Pt has been awake and alert since arriving on unit. Sling in place to right shoulder with ice applied. Pt has taken meds without difficulty and tolerated meds well. No indication of discomfort, no verbalization of pain. Call bell at side while in chair.

## 2016-05-25 NOTE — Op Note (Signed)
05/25/2016  9:56 AM  PATIENT:  Eric [name]    PRE-OPERATIVE DIAGNOSIS:  RIGHT SHOULDER PRIMARY LOCALIZED OSTEOARTHRITIS  POST-OPERATIVE DIAGNOSIS:  Same  PROCEDURE:  TOTAL SHOULDER ARTHROPLASTY  SURGEON:  Johnny Bridge, MD  PHYSICIAN ASSISTANT: Joya Gaskins, OPA-C, present and scrubbed throughout the case, critical for completion in a timely fashion, and for retraction, instrumentation, and closure.  ANESTHESIA:   General with regional block  Estimated blood loss 200 mL:  PREOPERATIVE INDICATIONS:  Eric [name] is a  71 y.o. male with a diagnosis of OA RIGHT SHOULDER who failed conservative measures and elected for surgical management.  He had a previous contralateral total shoulder replacement and did very well with the results.  The risks benefits and alternatives were discussed with the patient preoperatively including but not limited to the risks of infection, bleeding, nerve injury, cardiopulmonary complications, the need for revision surgery, dislocation, loosening, incomplete relief of pain, among others, and the patient was willing to proceed.   OPERATIVE IMPLANTS: Biomet size 14 micro- press-fit humeral stem, size 46+18 Versa-dial humeral head, set in the D position with increased coverage posteriorly, with a medium cemented glenoid polyethylene 3 peg implant with a central regenerex noncemented post.   OPERATIVE FINDINGS: Advanced glenohumeral osteoarthritis involving the glenoid and the humeral head with substantial osteophyte formation inferiorly. He also had substantial osteophyte formation in the front of the glenoid as well as posteriorly.  The biceps tendon was not located within the groove, and never encountered during the operation. It was probably torn chronically, and had atrophied. He has had a previous shoulder operation and it is possible that was also removed at that time.  There was substantial internal rotation contracture, and the subscapularis was  extremely tight. This was both before and after the subscapularis tenotomy. I was able to get it repaired back to its original position, however the shoulder architecture was extremely contracted.  I never encountered the cephalic vein although I was clearly in the deltopectoral interval, again it's possible that he may have had some scarring from previous surgery.  The glenoid was extremely flat, and shallow, and the entire girdle was medialized well underneath the coracoid. I did in fact have endpoints on all of the pegs as well as the central regenerate exposed, all of which were contained within the vault remarkably.  The rotator cuff tendons were present, and I did not appreciate rotator cuff deficiency.   OPERATIVE PROCEDURE: The patient was brought to the operating room and placed in the supine position. General anesthesia was administered after regional block. IV antibiotics were given.  The upper extremity was prepped and draped in usual sterile fashion. The patient was in a beachchair position with all bony prominences padded.   Time out was performed and a deltopectoral approach was carried out.  The subscapularis was released, tagging it with a #2 FiberWire, leaving a cuff of tendon for repair.   The inferior osteophyte was removed, and release of the capsule off of the humeral side was completed. The head was extremely flat. The head was dislocated, and I reamed sequentially. I placed the humeral cutting guide at 30 of retroversion, and then pinned this into place, and made my humeral neck cut. This was at the appropriate level.   I then placed deep retractors and exposed the glenoid. I excised the labrum circumferentially, although most of the labrum was already gone. I did take care to protect the axillary nerve inferiorly.   I then placed a guidewire into the  center position, controlling appropriate version and inclination. I then reamed over the guidewire with the small reamer, and  was satisfied with the preparation. I removed a little bit more bone anteriorly, and tried to accommodate for the superior remaining bone which was causing an inferior tilt of the glenoid. I preserved the subchondral bone in order to maximize the strength and minimize the risk for subsequent subsidence. During the reaming process, an anterior osteophyte came loose, which was removed with a Coker. I still had good anterior coverage, with the trial prosthesis reaching directly to the edge of the anterior bone. I suspect there was also a large posterior ledge of osteophyte, which I left intact posteriorly, but thus made the face of the vault look like my center hole was more anterior, one in fact I suspect that was directly centered given the fact I had an endpoint, I was just dealing with large ledges of osteophyte on the front and back.  I then drilled the central hole for the regenerex peg, and then placed the guide, and then drilled the 3 peripheral peg holes. I had excellent bony circumferential contact.   I then cleaned the glenoid, irrigated it copiously, and then dried it and cemented the prosthesis into place. Excellent seating was achieved. I had full exposure. The cement cured, and then I turned my attention to the humeral side.   I sequentially broached, up to the selected size, with the broach set at 30 of retroversion. I then placed the real stem. I trialed with multiple heads, and the above-named component was selected. Increased posterior coverage improved the coverage. The soft tissue tension was appropriate.   I then impacted the real humeral head into place, reduced the head, and irrigated copiously. Excellent stability and range of motion was achieved. I repaired the subscapularis with 4 #2 FiberWire, as well as the rotator interval, and irrigated copiously once more. The subcutaneous tissue was closed with Vicryl including the deltopectoral fascia.   The skin was closed with Steri-Strips  and sterile gauze was applied. He had a preoperative nerve block. He tolerated the procedure well and there were no complications.

## 2016-05-25 NOTE — Progress Notes (Signed)
Alert and oriented 71 y.o. Male admitted to unit with left shoulder repair for osteoarthritis.  Hydrocolloid dressing to incision, Sling to shoulder for immobilization, ice to site for comfort. Pt oriented to room and call bell. Denies pain at this time. Wife present.

## 2016-05-26 ENCOUNTER — Encounter (HOSPITAL_COMMUNITY): Payer: Self-pay | Admitting: Orthopedic Surgery

## 2016-05-26 LAB — CBC
HCT: 41.4 % (ref 39.0–52.0)
Hemoglobin: 13.5 g/dL (ref 13.0–17.0)
MCH: 30.5 pg (ref 26.0–34.0)
MCHC: 32.6 g/dL (ref 30.0–36.0)
MCV: 93.5 fL (ref 78.0–100.0)
Platelets: 157 10*3/uL (ref 150–400)
RBC: 4.43 MIL/uL (ref 4.22–5.81)
RDW: 13.5 % (ref 11.5–15.5)
WBC: 9.5 10*3/uL (ref 4.0–10.5)

## 2016-05-26 LAB — BASIC METABOLIC PANEL
Anion gap: 8 (ref 5–15)
BUN: 17 mg/dL (ref 6–20)
CO2: 26 mmol/L (ref 22–32)
Calcium: 9 mg/dL (ref 8.9–10.3)
Chloride: 105 mmol/L (ref 101–111)
Creatinine, Ser: 0.89 mg/dL (ref 0.61–1.24)
GFR calc Af Amer: >60 mL/min (ref >60)
GFR calc non Af Amer: >60 mL/min (ref >60)
Glucose, Bld: 103 mg/dL — ABNORMAL HIGH (ref 65–99)
Potassium: 4.1 mmol/L (ref 3.5–5.1)
Sodium: 139 mmol/L (ref 135–145)

## 2016-05-26 NOTE — Progress Notes (Signed)
PT Cancellation Note  Patient Details Name: Eric [name] MRN: [id number] DOB: 18-Nov-1945   Cancelled Treatment:    Reason Eval/Treat Not Completed: PT screened, no needs identified, will sign off.  Per OT, pt without PT needs.    [place] 05/26/2016, 10:32 AM

## 2016-05-26 NOTE — Evaluation (Signed)
Occupational Therapy Evaluation and Discharge Patient Details Name: Eric [name] MRN: [id number] DOB: March 05, 1946 Today's Date: 05/26/2016    History of Present Illness Pt is s/p right total shoulder arthroplasty. Pt has a past medical history including Colon polyps; Dysrhythmia; Hyperlipemia; Hypertension; Osteoarthrosis, Post-traumatic arthrosis of left shoulder (10/14/2015); and Tremor of both hands. Pt  has a past surgical history that includes Shoulder surgery (2009); Tonsillectomy; Colonoscopy; and Total shoulder arthroplasty (Left, 10/14/2015).   Clinical Impression   PTA Pt independent in ADL and mobility (used a cane PRN). Pt currently mod A for ADL and mod I for mobility. Pt with recent L shoulder replacement in July of 2017 with good recall of precautions. Pt and wife fully assessed and education complete. No questions or concerns from Pt or wife at the end of session. Progress rehab of shoulder as ordered by the MD at follow up. OT to sign off thank you for this referral.     Follow Up Recommendations  No OT follow up;Supervision/Assistance - 24 hour (initially)    Equipment Recommendations  None recommended by OT    Recommendations for Other Services       Precautions / Restrictions Precautions Precautions: Shoulder Type of Shoulder Precautions: Conservative Protocol Shoulder Interventions: Shoulder sling/immobilizer;At all times;Off for dressing/bathing/exercises Precaution Booklet Issued: Yes (comment) Precaution Comments: OT shoulder Protocol Handout Reviewed in full Required Braces or Orthoses: Sling Restrictions Weight Bearing Restrictions: Yes RUE Weight Bearing: Non weight bearing Other Position/Activity Restrictions: No PROM or AROM at shoulder      Mobility Bed Mobility               General bed mobility comments: Pt sitting OOB in chair upon arrival  Transfers Overall transfer level: Modified independent                    Balance  Overall balance assessment: Modified Independent                                          ADL Overall ADL's : Needs assistance/impaired Eating/Feeding: Modified independent   Grooming: Supervision/safety;Standing   Upper Body Bathing: Supervision/ safety;Adhering to UE precautions Upper Body Bathing Details (indicate cue type and reason): Pt educated in method of how to wash under arm  Lower Body Bathing: Supervison/ safety   Upper Body Dressing : Maximal assistance;With caregiver independent assisting;Sitting Upper Body Dressing Details (indicate cue type and reason): educated in dressing operated arm first with no shoulder movement Lower Body Dressing: Minimal assistance;With caregiver independent assisting   Toilet Transfer: Modified Programmer, applications Details (indicate cue type and reason): no attempt to use RUE in transfer     Tub/ Shower Transfer: Walk-in shower;Supervision/safety;With caregiver independent assisting   Functional mobility during ADLs: Modified independent General ADL Comments: Please see shoulder section below for more details     Vision Baseline Vision/History: Wears glasses Wears Glasses: At all times Patient Visual Report: No change from baseline Vision Assessment?: No apparent visual deficits     Perception     Praxis      Pertinent Vitals/Pain Pain Assessment: 0-10 Pain Score: 2  Pain Location: R shoulder Pain Descriptors / Indicators: Discomfort;Sore Pain Intervention(s): Monitored during session;Premedicated before session;Repositioned     Hand Dominance Right   Extremity/Trunk Assessment Upper Extremity Assessment Upper Extremity Assessment: RUE deficits/detail RUE Deficits / Details: deficits in ROM and strength post op  RUE: Unable to fully assess due to immobilization   Lower Extremity Assessment Lower Extremity Assessment: Overall WFL for tasks assessed   Cervical / Trunk Assessment Cervical / Trunk  Assessment: Normal   Communication Communication Communication: No difficulties   Cognition Arousal/Alertness: Awake/alert Behavior During Therapy: WFL for tasks assessed/performed Overall Cognitive Status: Within Functional Limits for tasks assessed                     General Comments  Pt's wife in room during session    Exercises Exercises: Shoulder     Shoulder Instructions Shoulder Instructions Donning/doffing shirt without moving shoulder: Moderate assistance;Patient able to independently direct caregiver;Caregiver independent with task Method for sponge bathing under operated UE: Modified independent Donning/doffing sling/immobilizer: Moderate assistance;Caregiver independent with task;Patient able to independently direct caregiver Correct positioning of sling/immobilizer: Minimal assistance;Caregiver independent with task;Patient able to independently direct caregiver ROM for elbow, wrist and digits of operated UE: Supervision/safety Sling wearing schedule (on at all times/off for ADL's): Independent Proper positioning of operated UE when showering: Modified independent Positioning of UE while sleeping: Modified independent    Home Living Family/patient expects to be discharged to:: Private residence Living Arrangements: Spouse/significant other Available Help at Discharge: Family Type of Home: House Home Access: Stairs to enter Technical brewer of Steps: 1   Home Layout: One level     Bathroom Shower/Tub: Occupational psychologist: Handicapped height Bathroom Accessibility: Yes How Accessible: Accessible via wheelchair Home Equipment: Shower seat - built in;Grab bars - toilet;Grab bars - tub/shower;Hand held shower head   Additional Comments: Pt had L shoulder replaced in Fall 2017      Prior Functioning/Environment Level of Independence: Independent        Comments: driving, very active, retired PA, 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  Problem List: Decreased strength;Decreased activity tolerance;Decreased range of motion;Pain;Impaired UE functional use      OT Treatment/Interventions:      OT Goals(Current goals can be found in the care plan section) Acute Rehab OT Goals Patient Stated Goal: to be able to shoot his gun for deer hunting season OT Goal Formulation: With patient/family Time For Goal Achievement: 06/09/16 Potential to Achieve Goals: Good  OT Frequency:     Barriers to D/C:            Co-evaluation              End of Session Nurse Communication: Mobility status;Precautions;Weight bearing status  Activity Tolerance: Patient tolerated treatment well Patient left: in chair;with call bell/phone within reach;with family/visitor present (with PA in room)  OT Visit Diagnosis: Pain Pain - Right/Left: Right Pain - part of body: Shoulder                ADL either performed or assessed with clinical judgement  Time: 0831-0904 OT Time Calculation (min): 33 min Charges:  OT General Charges $OT Visit: 1 Procedure OT Evaluation $OT Eval Moderate Complexity: 1 Procedure OT Treatments $Therapeutic Exercise: 8-22 mins G-Codes:     Hulda Humphrey OTR/L [place] Prajna Vanderpool 05/26/2016, 9:27 AM

## 2016-05-26 NOTE — Discharge Summary (Signed)
Physician Discharge Summary  Patient ID: Eric [name] MRN: [id number] DOB/AGE: 06/19/1945 71 y.o.  Admit date: 05/25/2016 Discharge date: 05/26/2016  Admission Diagnoses:  Primary localized osteoarthrosis of right shoulder  Discharge Diagnoses:  Principal Problem:   Primary localized osteoarthrosis of right shoulder Active Problems:   S/P shoulder replacement   Past Medical History:  Diagnosis Date  . Colon polyps   . Diverticulosis of colon   . Dysrhythmia    Palpations from caffiene  . GERD (gastroesophageal reflux disease)   . History of hay fever   . Hx of cardiovascular stress test    Lexiscan Myoview (04/2013):  No ischemia, EF 61%; low risk  . Hyperlipemia   . Hypertension   . Internal hemorrhoids without mention of complication   . Osteoarthrosis, unspecified whether generalized or localized, unspecified site   . 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 Primary localized osteoarthrosis of right shoulder 05/25/2016  . Recent weight loss    75lbs  . Seasonal allergies   . Sleep apnea    CPAP  . Thyroid nodule   . Tremor of both hands     Surgeries: Procedure(s): TOTAL SHOULDER ARTHROPLASTY on 05/25/2016   Consultants (if any):   Discharged Condition: Improved  Hospital Course: Eric [name] is an 71 y.o. male who was admitted 05/25/2016 with a diagnosis of Primary localized osteoarthrosis of right shoulder and went to the operating room on 05/25/2016 and underwent the above named procedures.    He was given perioperative antibiotics:  Anti-infectives    Start     Dose/Rate Route Frequency Ordered Stop   05/25/16 1300  ceFAZolin (ANCEF) IVPB 1 g/50 mL premix     1 g 100 mL/hr over 30 Minutes Intravenous Every 6 hours 05/25/16 1136 05/26/16 0101   05/25/16 0520  ceFAZolin (ANCEF) IVPB 2g/100 mL premix     2 g 200 mL/hr over 30 Minutes Intravenous On call to O.R. 05/25/16 4098 05/25/16 0755    .  He was given sequential compression devices,  early ambulation  for DVT prophylaxis.  He benefited maximally from the hospital stay and there were no complications.    Recent vital signs:  Vitals:   05/26/16 0039 05/26/16 0412  BP: 112/60 128/60  Pulse: 61 (!) 57  Resp: 18 16  Temp: 99.4 F (37.4 C) 98.5 F (36.9 C)    Recent laboratory studies:  Lab Results  Component Value Date   HGB 14.5 05/14/2016   HGB 15.7 04/23/2016   HGB 12.7 (L) 10/15/2015   Lab Results  Component Value Date   WBC 6.5 05/14/2016   PLT 159 05/14/2016   No results found for: INR Lab Results  Component Value Date   NA 141 05/14/2016   K 4.3 05/14/2016   CL 110 05/14/2016   CO2 24 05/14/2016   BUN 12 05/14/2016   CREATININE 0.91 05/14/2016   GLUCOSE 89 05/14/2016    Discharge Medications:   Allergies as of 05/26/2016      Reactions   Lisinopril Cough      Medication List    STOP taking these medications   aspirin 81 MG EC tablet   meloxicam 15 MG tablet Commonly known as:  MOBIC     TAKE these medications   acetaminophen 500 MG tablet Commonly known as:  TYLENOL Take 1,000 mg by mouth every 6 (six) hours as needed (for pain.).   ALLERGY EYE OP Place 1-2 drops into both eyes 3 (three) times daily as needed (  for dry/itching).   atorvastatin 40 MG tablet Commonly known as:  LIPITOR TAKE 1 TABLET DAILY What changed:  See the new instructions.   baclofen 10 MG tablet Commonly known as:  LIORESAL Take 1 tablet (10 mg total) by mouth 3 (three) times daily. As needed for muscle spasm   CoQ10 200 MG Caps Take 200 mg by mouth at bedtime.   fluticasone 50 MCG/ACT nasal spray Commonly known as:  FLONASE Place 1 spray into both nostrils daily as needed for allergies or rhinitis.   HYDROcodone-acetaminophen 10-325 MG tablet Commonly known as:  NORCO Take 1-2 tablets by mouth every 6 (six) hours as needed.   loratadine 10 MG tablet Commonly known as:  CLARITIN Take 10 mg by mouth daily.   multivitamin with minerals Tabs  tablet Take 1 tablet by mouth daily.   mupirocin ointment 2 % Commonly known as:  BACTROBAN Place 1 application into the nose 2 (two) times daily as needed (for skin tear/irritation.).   NONFORMULARY OR COMPOUNDED ITEM apap cpap maching--auto adjusting pressure  dx sleep apnea   ondansetron 4 MG tablet Commonly known as:  ZOFRAN Take 1 tablet (4 mg total) by mouth every 8 (eight) hours as needed for nausea or vomiting.   pantoprazole 40 MG tablet Commonly known as:  PROTONIX Take 1 tablet (40 mg total) by mouth daily.   sennosides-docusate sodium 8.6-50 MG tablet Commonly known as:  SENOKOT-S Take 2 tablets by mouth daily.   tamsulosin 0.4 MG Caps capsule Commonly known as:  FLOMAX Take 1 capsule (0.4 mg total) by mouth daily. What changed:  when to take this       Diagnostic Studies: Dg Shoulder Right Port  Result Date: 05/25/2016 CLINICAL DATA:  Shoulder place EXAM: PORTABLE RIGHT SHOULDER COMPARISON:  CT 09/18/2015 FINDINGS: RIGHT shoulder arthroplasty.  No fracture or dislocation. IMPRESSION: No complication following shoulder arthroplasty. Electronically Signed   By: Suzy Bouchard M.D.   On: 05/25/2016 10:48    Disposition: 01-Home or Self Care    Follow-up Information    Sascha Palma P, MD. Schedule an appointment as soon as possible for a visit in 2 weeks.   Specialty:  Orthopedic Surgery Contact information: [place] [address] [phone]            Signed: Johnny Bridge 05/26/2016, 8:08 AM

## 2016-05-26 NOTE — Progress Notes (Signed)
Patient and patient's spouse both verbalized understanding of discharge instructions. Patient able to teach information back to nurse. Patient provided with copy of discharge paperwork. Patient's wife stated that ortho PA had already provided her with patient's paper prescription for pain medicine yesterday and that she had already dropped it off at the pharmacy. Patient's IV removed. Patient assisted in getting dressed and sling in place. Patient taken down in wheelchair to discharge home.

## 2016-06-07 DIAGNOSIS — M19011 Primary osteoarthritis, right shoulder: Secondary | ICD-10-CM | POA: Diagnosis not present

## 2016-06-20 ENCOUNTER — Other Ambulatory Visit: Payer: Self-pay | Admitting: Family Medicine

## 2016-07-05 DIAGNOSIS — M19011 Primary osteoarthritis, right shoulder: Secondary | ICD-10-CM | POA: Diagnosis not present

## 2016-07-06 DIAGNOSIS — R293 Abnormal posture: Secondary | ICD-10-CM | POA: Diagnosis not present

## 2016-07-06 DIAGNOSIS — Z96611 Presence of right artificial shoulder joint: Secondary | ICD-10-CM | POA: Diagnosis not present

## 2016-07-06 DIAGNOSIS — Z471 Aftercare following joint replacement surgery: Secondary | ICD-10-CM | POA: Diagnosis not present

## 2016-07-12 DIAGNOSIS — R293 Abnormal posture: Secondary | ICD-10-CM | POA: Diagnosis not present

## 2016-07-12 DIAGNOSIS — Z96611 Presence of right artificial shoulder joint: Secondary | ICD-10-CM | POA: Diagnosis not present

## 2016-07-12 DIAGNOSIS — Z471 Aftercare following joint replacement surgery: Secondary | ICD-10-CM | POA: Diagnosis not present

## 2016-07-14 DIAGNOSIS — Z471 Aftercare following joint replacement surgery: Secondary | ICD-10-CM | POA: Diagnosis not present

## 2016-07-14 DIAGNOSIS — R293 Abnormal posture: Secondary | ICD-10-CM | POA: Diagnosis not present

## 2016-07-14 DIAGNOSIS — Z96611 Presence of right artificial shoulder joint: Secondary | ICD-10-CM | POA: Diagnosis not present

## 2016-07-16 DIAGNOSIS — R293 Abnormal posture: Secondary | ICD-10-CM | POA: Diagnosis not present

## 2016-07-16 DIAGNOSIS — Z471 Aftercare following joint replacement surgery: Secondary | ICD-10-CM | POA: Diagnosis not present

## 2016-07-16 DIAGNOSIS — Z96611 Presence of right artificial shoulder joint: Secondary | ICD-10-CM | POA: Diagnosis not present

## 2016-07-19 DIAGNOSIS — R293 Abnormal posture: Secondary | ICD-10-CM | POA: Diagnosis not present

## 2016-07-19 DIAGNOSIS — Z96611 Presence of right artificial shoulder joint: Secondary | ICD-10-CM | POA: Diagnosis not present

## 2016-07-19 DIAGNOSIS — Z471 Aftercare following joint replacement surgery: Secondary | ICD-10-CM | POA: Diagnosis not present

## 2016-07-21 DIAGNOSIS — Z96611 Presence of right artificial shoulder joint: Secondary | ICD-10-CM | POA: Diagnosis not present

## 2016-07-21 DIAGNOSIS — R293 Abnormal posture: Secondary | ICD-10-CM | POA: Diagnosis not present

## 2016-07-21 DIAGNOSIS — Z471 Aftercare following joint replacement surgery: Secondary | ICD-10-CM | POA: Diagnosis not present

## 2016-07-23 DIAGNOSIS — Z96611 Presence of right artificial shoulder joint: Secondary | ICD-10-CM | POA: Diagnosis not present

## 2016-07-23 DIAGNOSIS — Z471 Aftercare following joint replacement surgery: Secondary | ICD-10-CM | POA: Diagnosis not present

## 2016-07-23 DIAGNOSIS — R293 Abnormal posture: Secondary | ICD-10-CM | POA: Diagnosis not present

## 2016-07-28 DIAGNOSIS — R293 Abnormal posture: Secondary | ICD-10-CM | POA: Diagnosis not present

## 2016-07-28 DIAGNOSIS — Z471 Aftercare following joint replacement surgery: Secondary | ICD-10-CM | POA: Diagnosis not present

## 2016-07-28 DIAGNOSIS — Z96611 Presence of right artificial shoulder joint: Secondary | ICD-10-CM | POA: Diagnosis not present

## 2016-07-30 DIAGNOSIS — Z96611 Presence of right artificial shoulder joint: Secondary | ICD-10-CM | POA: Diagnosis not present

## 2016-07-30 DIAGNOSIS — Z471 Aftercare following joint replacement surgery: Secondary | ICD-10-CM | POA: Diagnosis not present

## 2016-07-30 DIAGNOSIS — R293 Abnormal posture: Secondary | ICD-10-CM | POA: Diagnosis not present

## 2016-08-02 DIAGNOSIS — M19011 Primary osteoarthritis, right shoulder: Secondary | ICD-10-CM | POA: Diagnosis not present

## 2016-08-04 DIAGNOSIS — Z96611 Presence of right artificial shoulder joint: Secondary | ICD-10-CM | POA: Diagnosis not present

## 2016-08-04 DIAGNOSIS — R293 Abnormal posture: Secondary | ICD-10-CM | POA: Diagnosis not present

## 2016-08-04 DIAGNOSIS — Z471 Aftercare following joint replacement surgery: Secondary | ICD-10-CM | POA: Diagnosis not present

## 2016-08-06 DIAGNOSIS — R279 Unspecified lack of coordination: Secondary | ICD-10-CM | POA: Diagnosis not present

## 2016-08-06 DIAGNOSIS — Z96611 Presence of right artificial shoulder joint: Secondary | ICD-10-CM | POA: Diagnosis not present

## 2016-08-06 DIAGNOSIS — R6889 Other general symptoms and signs: Secondary | ICD-10-CM | POA: Diagnosis not present

## 2016-08-09 ENCOUNTER — Other Ambulatory Visit: Payer: Self-pay | Admitting: Family Medicine

## 2016-08-09 DIAGNOSIS — N400 Enlarged prostate without lower urinary tract symptoms: Secondary | ICD-10-CM

## 2016-08-11 DIAGNOSIS — Z96611 Presence of right artificial shoulder joint: Secondary | ICD-10-CM | POA: Diagnosis not present

## 2016-08-11 DIAGNOSIS — R279 Unspecified lack of coordination: Secondary | ICD-10-CM | POA: Diagnosis not present

## 2016-08-11 DIAGNOSIS — R6889 Other general symptoms and signs: Secondary | ICD-10-CM | POA: Diagnosis not present

## 2016-08-13 DIAGNOSIS — R6889 Other general symptoms and signs: Secondary | ICD-10-CM | POA: Diagnosis not present

## 2016-08-13 DIAGNOSIS — R279 Unspecified lack of coordination: Secondary | ICD-10-CM | POA: Diagnosis not present

## 2016-08-13 DIAGNOSIS — Z96611 Presence of right artificial shoulder joint: Secondary | ICD-10-CM | POA: Diagnosis not present

## 2016-08-17 DIAGNOSIS — H524 Presbyopia: Secondary | ICD-10-CM | POA: Diagnosis not present

## 2016-08-17 DIAGNOSIS — H5203 Hypermetropia, bilateral: Secondary | ICD-10-CM | POA: Diagnosis not present

## 2016-08-17 DIAGNOSIS — H2513 Age-related nuclear cataract, bilateral: Secondary | ICD-10-CM | POA: Diagnosis not present

## 2016-08-18 DIAGNOSIS — R279 Unspecified lack of coordination: Secondary | ICD-10-CM | POA: Diagnosis not present

## 2016-08-18 DIAGNOSIS — Z96611 Presence of right artificial shoulder joint: Secondary | ICD-10-CM | POA: Diagnosis not present

## 2016-08-18 DIAGNOSIS — R6889 Other general symptoms and signs: Secondary | ICD-10-CM | POA: Diagnosis not present

## 2016-08-20 DIAGNOSIS — Z96611 Presence of right artificial shoulder joint: Secondary | ICD-10-CM | POA: Diagnosis not present

## 2016-08-20 DIAGNOSIS — R279 Unspecified lack of coordination: Secondary | ICD-10-CM | POA: Diagnosis not present

## 2016-08-20 DIAGNOSIS — R6889 Other general symptoms and signs: Secondary | ICD-10-CM | POA: Diagnosis not present

## 2016-08-23 DIAGNOSIS — R279 Unspecified lack of coordination: Secondary | ICD-10-CM | POA: Diagnosis not present

## 2016-08-23 DIAGNOSIS — R6889 Other general symptoms and signs: Secondary | ICD-10-CM | POA: Diagnosis not present

## 2016-08-23 DIAGNOSIS — Z96611 Presence of right artificial shoulder joint: Secondary | ICD-10-CM | POA: Diagnosis not present

## 2016-08-27 DIAGNOSIS — R6889 Other general symptoms and signs: Secondary | ICD-10-CM | POA: Diagnosis not present

## 2016-08-27 DIAGNOSIS — Z96611 Presence of right artificial shoulder joint: Secondary | ICD-10-CM | POA: Diagnosis not present

## 2016-08-27 DIAGNOSIS — R279 Unspecified lack of coordination: Secondary | ICD-10-CM | POA: Diagnosis not present

## 2016-08-30 DIAGNOSIS — R279 Unspecified lack of coordination: Secondary | ICD-10-CM | POA: Diagnosis not present

## 2016-08-30 DIAGNOSIS — R6889 Other general symptoms and signs: Secondary | ICD-10-CM | POA: Diagnosis not present

## 2016-08-30 DIAGNOSIS — Z96611 Presence of right artificial shoulder joint: Secondary | ICD-10-CM | POA: Diagnosis not present

## 2016-08-31 ENCOUNTER — Telehealth: Payer: Self-pay | Admitting: *Deleted

## 2016-08-31 NOTE — Telephone Encounter (Signed)
Received Physician Orders from [facility] [x2, CPAP and Equipment], forwarded to provider/SLS 06/12

## 2016-09-01 ENCOUNTER — Telehealth: Payer: Self-pay | Admitting: *Deleted

## 2016-09-01 DIAGNOSIS — R6889 Other general symptoms and signs: Secondary | ICD-10-CM | POA: Diagnosis not present

## 2016-09-01 DIAGNOSIS — Z96611 Presence of right artificial shoulder joint: Secondary | ICD-10-CM | POA: Diagnosis not present

## 2016-09-01 DIAGNOSIS — R279 Unspecified lack of coordination: Secondary | ICD-10-CM | POA: Diagnosis not present

## 2016-09-01 NOTE — Telephone Encounter (Signed)
Received Physician Orders from [facility] Equipment], forwarded to provider/SLS 06/13

## 2016-09-06 DIAGNOSIS — R6889 Other general symptoms and signs: Secondary | ICD-10-CM | POA: Diagnosis not present

## 2016-09-06 DIAGNOSIS — Z471 Aftercare following joint replacement surgery: Secondary | ICD-10-CM | POA: Diagnosis not present

## 2016-09-06 DIAGNOSIS — Z96611 Presence of right artificial shoulder joint: Secondary | ICD-10-CM | POA: Diagnosis not present

## 2016-09-06 DIAGNOSIS — R279 Unspecified lack of coordination: Secondary | ICD-10-CM | POA: Diagnosis not present

## 2016-09-10 DIAGNOSIS — R6889 Other general symptoms and signs: Secondary | ICD-10-CM | POA: Diagnosis not present

## 2016-09-10 DIAGNOSIS — Z471 Aftercare following joint replacement surgery: Secondary | ICD-10-CM | POA: Diagnosis not present

## 2016-09-10 DIAGNOSIS — Z96611 Presence of right artificial shoulder joint: Secondary | ICD-10-CM | POA: Diagnosis not present

## 2016-09-10 DIAGNOSIS — R279 Unspecified lack of coordination: Secondary | ICD-10-CM | POA: Diagnosis not present

## 2016-09-28 ENCOUNTER — Other Ambulatory Visit: Payer: Self-pay | Admitting: Family Medicine

## 2016-09-28 DIAGNOSIS — K219 Gastro-esophageal reflux disease without esophagitis: Secondary | ICD-10-CM

## 2016-10-21 ENCOUNTER — Ambulatory Visit: Payer: Medicare Other | Admitting: Family Medicine

## 2016-11-09 ENCOUNTER — Other Ambulatory Visit: Payer: Self-pay | Admitting: Family Medicine

## 2016-11-09 NOTE — Telephone Encounter (Signed)
Sent rx to pharmacy. LB 

## 2016-11-11 ENCOUNTER — Ambulatory Visit: Payer: Medicare Other | Admitting: Family Medicine

## 2016-11-18 ENCOUNTER — Ambulatory Visit (INDEPENDENT_AMBULATORY_CARE_PROVIDER_SITE_OTHER): Payer: Medicare Other | Admitting: Family Medicine

## 2016-11-18 ENCOUNTER — Encounter: Payer: Self-pay | Admitting: Family Medicine

## 2016-11-18 VITALS — BP 124/78 | HR 56 | Ht 69.5 in | Wt 238.6 lb

## 2016-11-18 DIAGNOSIS — G8929 Other chronic pain: Secondary | ICD-10-CM | POA: Diagnosis not present

## 2016-11-18 DIAGNOSIS — I10 Essential (primary) hypertension: Secondary | ICD-10-CM | POA: Insufficient documentation

## 2016-11-18 DIAGNOSIS — R739 Hyperglycemia, unspecified: Secondary | ICD-10-CM | POA: Diagnosis not present

## 2016-11-18 DIAGNOSIS — M25552 Pain in left hip: Secondary | ICD-10-CM | POA: Diagnosis not present

## 2016-11-18 DIAGNOSIS — M545 Low back pain, unspecified: Secondary | ICD-10-CM

## 2016-11-18 DIAGNOSIS — Z23 Encounter for immunization: Secondary | ICD-10-CM

## 2016-11-18 DIAGNOSIS — E785 Hyperlipidemia, unspecified: Secondary | ICD-10-CM | POA: Diagnosis not present

## 2016-11-18 LAB — COMPREHENSIVE METABOLIC PANEL
ALT: 16 U/L (ref 0–53)
AST: 16 U/L (ref 0–37)
Albumin: 4.6 g/dL (ref 3.5–5.2)
Alkaline Phosphatase: 61 U/L (ref 39–117)
BUN: 21 mg/dL (ref 6–23)
CO2: 30 mEq/L (ref 19–32)
Calcium: 9.9 mg/dL (ref 8.4–10.5)
Chloride: 101 mEq/L (ref 96–112)
Creatinine, Ser: 0.98 mg/dL (ref 0.40–1.50)
GFR: 80.24 mL/min (ref >60.00)
Glucose, Bld: 94 mg/dL (ref 70–99)
Potassium: 4.4 mEq/L (ref 3.5–5.1)
Sodium: 138 mEq/L (ref 135–145)
Total Bilirubin: 1.1 mg/dL (ref 0.2–1.2)
Total Protein: 7.2 g/dL (ref 6.0–8.3)

## 2016-11-18 LAB — LIPID PANEL
Cholesterol: 137 mg/dL (ref 0–200)
HDL: 50.1 mg/dL (ref >39.00)
LDL Cholesterol: 74 mg/dL (ref 0–99)
NonHDL: 87.32
Total CHOL/HDL Ratio: 3
Triglycerides: 69 mg/dL (ref 0.0–149.0)
VLDL: 13.8 mg/dL (ref 0.0–40.0)

## 2016-11-18 LAB — HEMOGLOBIN A1C: Hgb A1c MFr Bld: 5.5 % (ref 4.6–6.5)

## 2016-11-18 NOTE — Progress Notes (Signed)
Patient ID: Eric [name], male    DOB: 1945/06/14  Age: 71 y.o. MRN: [id number]    Subjective:  Subjective  HPI Eric [name] presents for f/u cholesterol and bp.  Pt is recovering well from shoulder surgery.  He is seeing sport med today for hip pain.  No other complaints.   Review of Systems  Constitutional: Negative for appetite change, diaphoresis, fatigue and unexpected weight change.  Eyes: Negative for pain, redness and visual disturbance.  Respiratory: Negative for cough, chest tightness, shortness of breath and wheezing.   Cardiovascular: Negative for chest pain, palpitations and leg swelling.  Endocrine: Negative for cold intolerance, heat intolerance, polydipsia, polyphagia and polyuria.  Genitourinary: Negative for difficulty urinating, dysuria and frequency.  Neurological: Negative for dizziness, light-headedness, numbness and headaches.    History Past Medical History:  Diagnosis Date  . Colon polyps   . Diverticulosis of colon   . Dysrhythmia    Palpations from caffiene  . GERD (gastroesophageal reflux disease)   . History of hay fever   . Hx of cardiovascular stress test    Lexiscan Myoview (04/2013):  No ischemia, EF 61%; low risk  . Hyperlipemia   . Hypertension   . Internal hemorrhoids without mention of complication   . Osteoarthrosis, unspecified whether generalized or localized, unspecified site   . 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 Primary localized osteoarthrosis of right shoulder 05/25/2016  . Recent weight loss    75lbs  . Seasonal allergies   . Sleep apnea    CPAP  . Thyroid nodule   . Tremor of both hands     He has a past surgical history that includes Shoulder surgery (2009); Tonsillectomy; Colonoscopy; Total shoulder arthroplasty (Left, 10/14/2015); and Total shoulder arthroplasty (Right, 05/25/2016).   His family history includes Arthritis in his mother; Breast cancer in his unknown relative; Cancer (age of onset: 27) in  his sister; Cancer (age of onset: 59) in his mother; Cancer (age of onset: 77) in his brother; Colon polyps (age of onset: 3) in his mother; Heart disease in his father; Hyperlipidemia in his father and mother; Hypertension in his father; Prostate cancer in his unknown relative.He reports that he has never smoked. He has never used smokeless tobacco. He reports that he drinks alcohol. He reports that he does not use drugs.  Current Outpatient Prescriptions on File Prior to Visit  Medication Sig Dispense Refill  . acetaminophen (TYLENOL) 500 MG tablet Take 1,000 mg by mouth every 6 (six) hours as needed (for pain.).    [name] atorvastatin (LIPITOR) 40 MG tablet TAKE 1 TABLET DAILY 90 tablet 1  . baclofen (LIORESAL) 10 MG tablet Take 1 tablet (10 mg total) by mouth 3 (three) times daily. As needed for muscle spasm 50 tablet 0  . Coenzyme Q10 (COQ10) 200 MG CAPS Take 200 mg by mouth at bedtime.    . fluticasone (FLONASE) 50 MCG/ACT nasal spray Place 1 spray into both nostrils daily as needed for allergies or rhinitis.    [name] HYDROcodone-acetaminophen (NORCO) 10-325 MG tablet Take 1-2 tablets by mouth every 6 (six) hours as needed. 50 tablet 0  . loratadine (CLARITIN) 10 MG tablet Take 10 mg by mouth daily.    . meloxicam (MOBIC) 15 MG tablet TAKE 1 TABLET DAILY 90 tablet 0  . Multiple Vitamin (MULTIVITAMIN WITH MINERALS) TABS tablet Take 1 tablet by mouth daily.    . mupirocin ointment (BACTROBAN) 2 % Place 1 application into the nose 2 (two) times  daily as needed (for skin tear/irritation.).    Mable Fill (ALLERGY EYE OP) Place 1-2 drops into both eyes 3 (three) times daily as needed (for dry/itching).    . NONFORMULARY OR COMPOUNDED ITEM apap cpap maching--auto adjusting pressure  dx sleep apnea 1 each 0  . ondansetron (ZOFRAN) 4 MG tablet Take 1 tablet (4 mg total) by mouth every 8 (eight) hours as needed for nausea or vomiting. 30 tablet 0  . pantoprazole (PROTONIX) 40 MG tablet TAKE 1  TABLET DAILY 90 tablet 3  . sennosides-docusate sodium (SENOKOT-S) 8.6-50 MG tablet Take 2 tablets by mouth daily. 30 tablet 1  . tamsulosin (FLOMAX) 0.4 MG CAPS capsule TAKE 1 CAPSULE DAILY 90 capsule 3   No current facility-administered medications on file prior to visit.      Objective:  Objective  Physical Exam  Constitutional: He is oriented to person, place, and time. Vital signs are normal. He appears well-developed and well-nourished. He is sleeping. No distress.  HENT:  Head: Normocephalic and atraumatic.  Mouth/Throat: Oropharynx is clear and moist.  Eyes: Pupils are equal, round, and reactive to light. EOM are normal.  Neck: Normal range of motion. Neck supple. No thyromegaly present.  Cardiovascular: Normal rate, regular rhythm and normal heart sounds.   No murmur heard. Pulmonary/Chest: Effort normal and breath sounds normal. No respiratory distress. He has no wheezes. He has no rales. He exhibits no tenderness.  Musculoskeletal: He exhibits no edema or tenderness.  Neurological: He is alert and oriented to person, place, and time.  Skin: Skin is warm and dry.  Psychiatric: He has a normal mood and affect. His behavior is normal. Judgment and thought content normal.  Nursing note and vitals reviewed.  BP 124/78 (BP Location: Left Arm, Patient Position: Sitting, Cuff Size: Normal)   Pulse (!) 56   Ht 5' 9.5" (1.765 m)   Wt 238 lb 9.6 oz (108.2 kg)   SpO2 97%   BMI 34.73 kg/m  Wt Readings from Last 3 Encounters:  11/18/16 238 lb (108 kg)  11/18/16 238 lb 9.6 oz (108.2 kg)  05/25/16 229 lb (103.9 kg)     Lab Results  Component Value Date   WBC 9.5 05/26/2016   HGB 13.5 05/26/2016   HCT 41.4 05/26/2016   PLT 157 05/26/2016   GLUCOSE 94 11/18/2016   CHOL 137 11/18/2016   TRIG 69.0 11/18/2016   HDL 50.10 11/18/2016   LDLCALC 74 11/18/2016   ALT 16 11/18/2016   AST 16 11/18/2016   NA 138 11/18/2016   K 4.4 11/18/2016   CL 101 11/18/2016   CREATININE 0.98  11/18/2016   BUN 21 11/18/2016   CO2 30 11/18/2016   TSH 0.62 10/03/2015   PSA 0.9 04/23/2016   HGBA1C 5.5 11/18/2016   MICROALBUR 1.5 12/25/2012    No results found.   Assessment & Plan:  Plan  I am having Mr. Kray maintain his loratadine, fluticasone, NONFORMULARY OR COMPOUNDED ITEM, multivitamin with minerals, CoQ10, mupirocin ointment, Naphazoline-Pheniramine (ALLERGY EYE OP), acetaminophen, ondansetron, baclofen, sennosides-docusate sodium, HYDROcodone-acetaminophen, atorvastatin, tamsulosin, pantoprazole, and meloxicam.  No orders of the defined types were placed in this encounter.   Problem List Items Addressed This Visit      Unprioritized   Need for prophylactic vaccination and inoculation against influenza   Relevant Orders   Flu vaccine HIGH DOSE PF (Fluzone High dose) (Completed)   Essential hypertension - Primary    Well controlled, no changes to meds. Encouraged heart healthy diet such  as the DASH diet and exercise as tolerated.        Relevant Orders   Lipid panel (Completed)   Comprehensive metabolic panel (Completed)   Hyperglycemia    Watch simple sugars and starches  Check labs      Relevant Orders   Hemoglobin A1c (Completed)   Hyperlipidemia LDL goal <100    Encouraged heart healthy diet, increase exercise, avoid trans fats, consider a krill oil cap daily      Relevant Orders   Lipid panel (Completed)   Comprehensive metabolic panel (Completed)      Follow-up: Return in about 6 months (around 05/19/2017) for hypertension, hyperlipidemia.  [name], DO

## 2016-11-18 NOTE — Patient Instructions (Signed)
We will set you up for an injection of your left hip and an epidural - we will see if they can do both in the same visit or if they have to separate them - if they're separate we will arrange the hip first. We'll also check on the hip replacement physicians re: the questions we talked about. Your pain is due to arthritis. These are the different medications you can take for this: Tylenol 500mg  1-2 tabs three times a day for pain. Capsaicin, aspercreme, or biofreeze topically up to four times a day may also help with pain. Some supplements that may help for arthritis: Boswellia extract, curcumin, pycnogenol Mobic or aleve as needed. It's important that you continue to stay active. Consider physical therapy to strengthen muscles around the joint that hurts to take pressure off of the joint itself. Shoe inserts with good arch support may be helpful. Heat or ice 15 minutes at a time 3-4 times a day as needed to help with pain. Water aerobics and cycling with low resistance are the best two types of exercise for arthritis though any exercise is ok as long as it doesn't worsen the pain. Follow up with me in 1 month.

## 2016-11-18 NOTE — Patient Instructions (Signed)

## 2016-11-19 NOTE — Assessment & Plan Note (Signed)
believe he has two separate issues currently.  Flare up of known degenerative disc disease, facet arthropathy with mild-mod foraminal stenosis at L5-S1.  Also with flare of left hip arthropathy.  Reviewed mobic or aleve, tylenol, topical medications, supplements that may help.  He would like to try ESI for back and intraarticular cortisone injection for left hip.  F/u in 1 month.

## 2016-11-19 NOTE — Progress Notes (Signed)
PCP: [name], DO  Subjective:   HPI: Patient is a 71 y.o. male here for low back pain.  09/01/15: Patient reports his low back has been bothering him more recently. Pain is 3/10 level and dull but can be sharp with a lot of activities. Past year has been more constant and severe. Numbness into left toes. Pain primarily in center of low back. Worse with yardwork, specifically using the weedeater. MRI from 05/2013 showed mild-moderate L5-S1 foraminal stenosis bilaterally. No bowel/bladder dysfunction.  11/18/16: Patient reports he's had worsening back but also left hip/groin pain over past several months. Groin pain worse over past 6 months to 4/10, sharp. Causing him to limp with walking. Better with rest. Worse when he first gets up. Also reports low back pain radiating down left leg at 3/10 level. ESI had helped him though recalls felt sharp sciatica when needle was inserted to the area prior to shot. No bowel/bladder dysfunction. No skin changes.  Past Medical History:  Diagnosis Date  . Colon polyps   . Diverticulosis of colon   . Dysrhythmia    Palpations from caffiene  . GERD (gastroesophageal reflux disease)   . History of hay fever   . Hx of cardiovascular stress test    Lexiscan Myoview (04/2013):  No ischemia, EF 61%; low risk  . Hyperlipemia   . Hypertension   . Internal hemorrhoids without mention of complication   . Osteoarthrosis, unspecified whether generalized or localized, unspecified site   . 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 Primary localized osteoarthrosis of right shoulder 05/25/2016  . Recent weight loss    75lbs  . Seasonal allergies   . Sleep apnea    CPAP  . Thyroid nodule   . Tremor of both hands     Current Outpatient Prescriptions on File Prior to Visit  Medication Sig Dispense Refill  . acetaminophen (TYLENOL) 500 MG tablet Take 1,000 mg by mouth every 6 (six) hours as needed (for pain.).    [name]  atorvastatin (LIPITOR) 40 MG tablet TAKE 1 TABLET DAILY 90 tablet 1  . baclofen (LIORESAL) 10 MG tablet Take 1 tablet (10 mg total) by mouth 3 (three) times daily. As needed for muscle spasm 50 tablet 0  . Coenzyme Q10 (COQ10) 200 MG CAPS Take 200 mg by mouth at bedtime.    . fluticasone (FLONASE) 50 MCG/ACT nasal spray Place 1 spray into both nostrils daily as needed for allergies or rhinitis.    [name] HYDROcodone-acetaminophen (NORCO) 10-325 MG tablet Take 1-2 tablets by mouth every 6 (six) hours as needed. 50 tablet 0  . loratadine (CLARITIN) 10 MG tablet Take 10 mg by mouth daily.    . meloxicam (MOBIC) 15 MG tablet TAKE 1 TABLET DAILY 90 tablet 0  . Multiple Vitamin (MULTIVITAMIN WITH MINERALS) TABS tablet Take 1 tablet by mouth daily.    . mupirocin ointment (BACTROBAN) 2 % Place 1 application into the nose 2 (two) times daily as needed (for skin tear/irritation.).    Mable Fill (ALLERGY EYE OP) Place 1-2 drops into both eyes 3 (three) times daily as needed (for dry/itching).    . NONFORMULARY OR COMPOUNDED ITEM apap cpap maching--auto adjusting pressure  dx sleep apnea 1 each 0  . ondansetron (ZOFRAN) 4 MG tablet Take 1 tablet (4 mg total) by mouth every 8 (eight) hours as needed for nausea or vomiting. 30 tablet 0  . pantoprazole (PROTONIX) 40 MG tablet TAKE 1 TABLET DAILY 90 tablet 3  .  sennosides-docusate sodium (SENOKOT-S) 8.6-50 MG tablet Take 2 tablets by mouth daily. 30 tablet 1  . tamsulosin (FLOMAX) 0.4 MG CAPS capsule TAKE 1 CAPSULE DAILY 90 capsule 3   No current facility-administered medications on file prior to visit.     Past Surgical History:  Procedure Laterality Date  . COLONOSCOPY    . SHOULDER SURGERY  June 24, 2007   right  . TONSILLECTOMY    . TOTAL SHOULDER ARTHROPLASTY Left 10/14/2015   Procedure: LEFT TOTAL SHOULDER ARTHROPLASTY;  Surgeon: Marchia Bond, MD;  Location: [facility];  Service: Orthopedics;  Laterality: Left;  . TOTAL SHOULDER ARTHROPLASTY Right  05/25/2016   Procedure: TOTAL SHOULDER ARTHROPLASTY;  Surgeon: Marchia Bond, MD;  Location: [facility];  Service: Orthopedics;  Laterality: Right;    Allergies  Allergen Reactions  . Lisinopril Cough    Social History   Social History  . Marital status: Married    Spouse name: N/A  . Number of children: 3  . Years of education: N/A   Occupational History  . Retired--[employer]    Social History Main Topics  . Smoking status: Never Smoker  . Smokeless tobacco: Never Used  . Alcohol use 0.0 oz/week     Comment: rare----12 a year  . Drug use: No  . Sexual activity: Yes    Partners: Female   Other Topics Concern  . Not on file   Social History Narrative   Lives with wife and daughter and two grandchildren.     Family History  Problem Relation Age of Onset  . Arthritis Mother   . Cancer Mother 68       breast  . Hyperlipidemia Mother   . Colon polyps Mother 42  . Heart disease Father        CAD--passed away 06-24-2011 age 81  . Hyperlipidemia Father   . Hypertension Father   . Cancer Sister 51       breast  . Cancer Brother 43       prostate  . Breast cancer Unknown   . Prostate cancer Unknown   . Colon cancer Neg Hx     BP 134/76   Pulse (!) 51   Ht 5\' 10"  (1.778 m)   Wt 238 lb (108 kg)   BMI 34.15 kg/m   Review of Systems: See HPI above.    Objective:  Physical Exam:  Gen: NAD, comfortable in exam room  Back/left hip: No gross deformity, scoliosis. TTP mild low lumbar in midline.  No other tenderness. FROM with pain on flexion > extension. Strength LEs 5/5 all muscle groups.   2+ MSRs in patellar and achilles tendons, equal bilaterally. Negative SLRs. Sensation intact to light touch bilaterally. Limited IR on both hips but excellent ER - positive logroll on left. Negative fabers and piriformis stretches. Pain in groin on left fabers.    Assessment & Plan:  1. Low back pain/left hip pain - believe he has two separate issues currently.   Flare up of known degenerative disc disease, facet arthropathy with mild-mod foraminal stenosis at L5-S1.  Also with flare of left hip arthropathy.  Reviewed mobic or aleve, tylenol, topical medications, supplements that may help.  He would like to try ESI for back and intraarticular cortisone injection for left hip.  F/u in 1 month.

## 2016-11-21 NOTE — Assessment & Plan Note (Signed)
Encouraged heart healthy diet, increase exercise, avoid trans fats, consider a krill oil cap daily 

## 2016-11-21 NOTE — Assessment & Plan Note (Signed)
Well controlled, no changes to meds. Encouraged heart healthy diet such as the DASH diet and exercise as tolerated.  °

## 2016-11-21 NOTE — Assessment & Plan Note (Signed)
Watch simple sugars and starches Check labs  

## 2016-11-24 ENCOUNTER — Other Ambulatory Visit: Payer: Self-pay | Admitting: Family Medicine

## 2016-11-24 DIAGNOSIS — G8929 Other chronic pain: Secondary | ICD-10-CM

## 2016-11-24 DIAGNOSIS — M545 Low back pain: Principal | ICD-10-CM

## 2016-11-24 DIAGNOSIS — M25552 Pain in left hip: Secondary | ICD-10-CM

## 2016-11-30 ENCOUNTER — Other Ambulatory Visit: Payer: Self-pay | Admitting: Family Medicine

## 2016-12-02 ENCOUNTER — Other Ambulatory Visit: Payer: Medicare Other

## 2016-12-02 ENCOUNTER — Ambulatory Visit
Admission: RE | Admit: 2016-12-02 | Discharge: 2016-12-02 | Disposition: A | Discharge status: Home / Self Care | Payer: Medicare Other | Source: Ambulatory Visit | Attending: Family Medicine | Admitting: Family Medicine

## 2016-12-02 DIAGNOSIS — M25552 Pain in left hip: Secondary | ICD-10-CM

## 2016-12-02 MED ORDER — IOPAMIDOL (ISOVUE-M 200) INJECTION 41%
1.0000 mL | Freq: Once | INTRAMUSCULAR | Status: AC
  Administered 2016-12-02: 1 mL via INTRA_ARTICULAR

## 2016-12-02 MED ORDER — METHYLPREDNISOLONE ACETATE 40 MG/ML INJ SUSP (RADIOLOG
120.0000 mg | Freq: Once | INTRAMUSCULAR | Status: AC
  Administered 2016-12-02: 120 mg via INTRA_ARTICULAR

## 2016-12-16 ENCOUNTER — Other Ambulatory Visit: Payer: Self-pay | Admitting: Family Medicine

## 2016-12-16 ENCOUNTER — Ambulatory Visit
Admission: RE | Admit: 2016-12-16 | Discharge: 2016-12-16 | Disposition: A | Discharge status: Home / Self Care | Payer: Medicare Other | Source: Ambulatory Visit | Attending: Family Medicine | Admitting: Family Medicine

## 2016-12-16 DIAGNOSIS — M545 Low back pain: Principal | ICD-10-CM

## 2016-12-16 DIAGNOSIS — G8929 Other chronic pain: Secondary | ICD-10-CM

## 2016-12-16 MED ORDER — METHYLPREDNISOLONE ACETATE 40 MG/ML INJ SUSP (RADIOLOG
120.0000 mg | Freq: Once | INTRAMUSCULAR | Status: AC
  Administered 2016-12-16: 120 mg via EPIDURAL

## 2016-12-16 MED ORDER — IOPAMIDOL (ISOVUE-M 200) INJECTION 41%
1.0000 mL | Freq: Once | INTRAMUSCULAR | Status: AC
  Administered 2016-12-16: 1 mL via EPIDURAL

## 2016-12-20 ENCOUNTER — Ambulatory Visit (INDEPENDENT_AMBULATORY_CARE_PROVIDER_SITE_OTHER): Payer: Medicare Other | Admitting: Family Medicine

## 2016-12-20 ENCOUNTER — Encounter: Payer: Self-pay | Admitting: Family Medicine

## 2016-12-20 DIAGNOSIS — M25552 Pain in left hip: Secondary | ICD-10-CM | POA: Diagnosis not present

## 2016-12-20 DIAGNOSIS — G8929 Other chronic pain: Secondary | ICD-10-CM

## 2016-12-20 DIAGNOSIS — M545 Low back pain, unspecified: Secondary | ICD-10-CM

## 2016-12-20 NOTE — Assessment & Plan Note (Signed)
2/2 flare of DDD and facet arthropathy of low back with mild-mod stenosis at L5-S1, also with hip arthritis.  No improvement with hip injection though low back injection helped him.  Declined physical therapy for now.  Tylenol, topical medications, supplements, aleve or mobic.  Heat/ice.  F/u prn.

## 2016-12-20 NOTE — Patient Instructions (Signed)
I wouldn't repeat the hip injection again though in the future if you wanted we could set up the back injection(s). Your pain is due to arthritis. These are the different medications you can take for this: Tylenol 500mg  1-2 tabs three times a day for pain. Capsaicin, aspercreme, or biofreeze topically up to four times a day may also help with pain. Some supplements that may help for arthritis: Boswellia extract, curcumin, pycnogenol Mobic or aleve as needed. It's important that you continue to stay active. Consider physical therapy to strengthen muscles around the joint that hurts to take pressure off of the joint itself - let me know if you want to do this in the future. Shoe inserts with good arch support may be helpful. Heat or ice 15 minutes at a time 3-4 times a day as needed to help with pain. Water aerobics and cycling with low resistance are the best two types of exercise for arthritis though any exercise is ok as long as it doesn't worsen the pain. Follow up with me as needed.

## 2016-12-20 NOTE — Progress Notes (Signed)
PCP: [name], DO  Subjective:   HPI: Patient is a 71 y.o. male here for low back pain.  09/01/15: Patient reports his low back has been bothering him more recently. Pain is 3/10 level and dull but can be sharp with a lot of activities. Past year has been more constant and severe. Numbness into left toes. Pain primarily in center of low back. Worse with yardwork, specifically using the weedeater. MRI from 05/2013 showed mild-moderate L5-S1 foraminal stenosis bilaterally. No bowel/bladder dysfunction.  11/18/16: Patient reports he's had worsening back but also left hip/groin pain over past several months. Groin pain worse over past 6 months to 4/10, sharp. Causing him to limp with walking. Better with rest. Worse when he first gets up. Also reports low back pain radiating down left leg at 3/10 level. ESI had helped him though recalls felt sharp sciatica when needle was inserted to the area prior to shot. No bowel/bladder dysfunction. No skin changes.  10/1: Patient reports his back is improved compared to last visit. Pain up to 2/10, more dull. Hard to get started walking and getting up but eases after this. Feels hip did not improve with injection this time though, pain 3/10 level. No bowel/bladder dysfunction. No skin changes, numbness.  Past Medical History:  Diagnosis Date  . Colon polyps   . Diverticulosis of colon   . Dysrhythmia    Palpations from caffiene  . GERD (gastroesophageal reflux disease)   . History of hay fever   . Hx of cardiovascular stress test    Lexiscan Myoview (04/2013):  No ischemia, EF 61%; low risk  . Hyperlipemia   . Hypertension   . Internal hemorrhoids without mention of complication   . Osteoarthrosis, unspecified whether generalized or localized, unspecified site   . 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 Primary localized osteoarthrosis of right shoulder 05/25/2016  . Recent weight loss    75lbs  . Seasonal  allergies   . Sleep apnea    CPAP  . Thyroid nodule   . Tremor of both hands     Current Outpatient Prescriptions on File Prior to Visit  Medication Sig Dispense Refill  . acetaminophen (TYLENOL) 500 MG tablet Take 1,000 mg by mouth every 6 (six) hours as needed (for pain.).    [name] atorvastatin (LIPITOR) 40 MG tablet TAKE 1 TABLET DAILY 90 tablet 1  . baclofen (LIORESAL) 10 MG tablet Take 1 tablet (10 mg total) by mouth 3 (three) times daily. As needed for muscle spasm 50 tablet 0  . Coenzyme Q10 (COQ10) 200 MG CAPS Take 200 mg by mouth at bedtime.    . fluticasone (FLONASE) 50 MCG/ACT nasal spray Place 1 spray into both nostrils daily as needed for allergies or rhinitis.    [name] HYDROcodone-acetaminophen (NORCO) 10-325 MG tablet Take 1-2 tablets by mouth every 6 (six) hours as needed. 50 tablet 0  . loratadine (CLARITIN) 10 MG tablet Take 10 mg by mouth daily.    . meloxicam (MOBIC) 15 MG tablet TAKE 1 TABLET DAILY 90 tablet 0  . Multiple Vitamin (MULTIVITAMIN WITH MINERALS) TABS tablet Take 1 tablet by mouth daily.    . mupirocin ointment (BACTROBAN) 2 % Place 1 application into the nose 2 (two) times daily as needed (for skin tear/irritation.).    Mable Fill (ALLERGY EYE OP) Place 1-2 drops into both eyes 3 (three) times daily as needed (for dry/itching).    . NONFORMULARY OR COMPOUNDED ITEM apap cpap maching--auto adjusting  pressure  dx sleep apnea 1 each 0  . ondansetron (ZOFRAN) 4 MG tablet Take 1 tablet (4 mg total) by mouth every 8 (eight) hours as needed for nausea or vomiting. 30 tablet 0  . pantoprazole (PROTONIX) 40 MG tablet TAKE 1 TABLET DAILY 90 tablet 3  . sennosides-docusate sodium (SENOKOT-S) 8.6-50 MG tablet Take 2 tablets by mouth daily. 30 tablet 1  . tamsulosin (FLOMAX) 0.4 MG CAPS capsule TAKE 1 CAPSULE DAILY 90 capsule 3   No current facility-administered medications on file prior to visit.     Past Surgical History:  Procedure Laterality Date  .  COLONOSCOPY    . SHOULDER SURGERY  July 12, 2007   right  . TONSILLECTOMY    . TOTAL SHOULDER ARTHROPLASTY Left 10/14/2015   Procedure: LEFT TOTAL SHOULDER ARTHROPLASTY;  Surgeon: Marchia Bond, MD;  Location: [place];  Service: Orthopedics;  Laterality: Left;  . TOTAL SHOULDER ARTHROPLASTY Right 05/25/2016   Procedure: TOTAL SHOULDER ARTHROPLASTY;  Surgeon: Marchia Bond, MD;  Location: [facility];  Service: Orthopedics;  Laterality: Right;    Allergies  Allergen Reactions  . Lisinopril Cough    Social History   Social History  . Marital status: Married    Spouse name: N/A  . Number of children: 3  . Years of education: N/A   Occupational History  . Retired--[employer], [employer]    Social History Main Topics  . Smoking status: Never Smoker  . Smokeless tobacco: Never Used  . Alcohol use 0.0 oz/week     Comment: rare----12 a year  . Drug use: No  . Sexual activity: Yes    Partners: Female   Other Topics Concern  . Not on file   Social History Narrative   Lives with wife and daughter and two grandchildren.     Family History  Problem Relation Age of Onset  . Arthritis Mother   . Cancer Mother 43       breast  . Hyperlipidemia Mother   . Colon polyps Mother 78  . Heart disease Father        CAD--passed away 07-12-2011 age 37  . Hyperlipidemia Father   . Hypertension Father   . Cancer Sister 74       breast  . Cancer Brother 84       prostate  . Breast cancer Unknown   . Prostate cancer Unknown   . Colon cancer Neg Hx     BP 104/67   Pulse (!) 51   Ht 5\' 10"  (1.778 m)   Wt 235 lb (106.6 kg)   BMI 33.72 kg/m   Review of Systems: See HPI above.    Objective:  Physical Exam:  Gen: NAD, comfortable in exam room  Back/left hip: No gross deformity, scoliosis. TTP mildly low lumbar midline and left paraspinal region. FROM with mild pain on extension. Strength LEs 5/5 all muscle groups.   2+ MSRs in patellar and achilles tendons, equal bilaterally. Negative  SLRs. Sensation intact to light touch bilaterally. Positive logroll on left with 0 degree IR, 5 degrees IR on right and negative logroll.    Assessment & Plan:  1. Low back pain/left hip pain - 2/2 flare of DDD and facet arthropathy of low back with mild-mod stenosis at L5-S1, also with hip arthritis.  No improvement with hip injection though low back injection helped him.  Declined physical therapy for now.  Tylenol, topical medications, supplements, aleve or mobic.  Heat/ice.  F/u prn.

## 2017-02-08 ENCOUNTER — Other Ambulatory Visit: Payer: Self-pay | Admitting: Family Medicine

## 2017-02-15 DIAGNOSIS — M1612 Unilateral primary osteoarthritis, left hip: Secondary | ICD-10-CM | POA: Diagnosis not present

## 2017-03-01 ENCOUNTER — Encounter: Payer: Self-pay | Admitting: Family Medicine

## 2017-03-02 NOTE — Telephone Encounter (Signed)
Lets get hi on the schedule for clearance Merry Christmas!!

## 2017-03-08 ENCOUNTER — Encounter: Payer: Self-pay | Admitting: Family Medicine

## 2017-03-08 ENCOUNTER — Ambulatory Visit (INDEPENDENT_AMBULATORY_CARE_PROVIDER_SITE_OTHER): Payer: Medicare Other | Admitting: Family Medicine

## 2017-03-08 VITALS — BP 136/76 | HR 70 | Temp 97.8°F | Ht 68.0 in | Wt 257.0 lb

## 2017-03-08 DIAGNOSIS — I10 Essential (primary) hypertension: Secondary | ICD-10-CM

## 2017-03-08 DIAGNOSIS — E785 Hyperlipidemia, unspecified: Secondary | ICD-10-CM | POA: Diagnosis not present

## 2017-03-08 DIAGNOSIS — M25552 Pain in left hip: Secondary | ICD-10-CM

## 2017-03-08 DIAGNOSIS — Z23 Encounter for immunization: Secondary | ICD-10-CM | POA: Diagnosis not present

## 2017-03-08 DIAGNOSIS — R739 Hyperglycemia, unspecified: Secondary | ICD-10-CM

## 2017-03-08 LAB — COMPREHENSIVE METABOLIC PANEL
ALT: 19 U/L (ref 0–53)
AST: 15 U/L (ref 0–37)
Albumin: 4.3 g/dL (ref 3.5–5.2)
Alkaline Phosphatase: 56 U/L (ref 39–117)
BUN: 26 mg/dL — ABNORMAL HIGH (ref 6–23)
CO2: 31 mEq/L (ref 19–32)
Calcium: 9.3 mg/dL (ref 8.4–10.5)
Chloride: 106 mEq/L (ref 96–112)
Creatinine, Ser: 0.97 mg/dL (ref 0.40–1.50)
GFR: 81.12 mL/min (ref >60.00)
Glucose, Bld: 94 mg/dL (ref 70–99)
Potassium: 4.6 mEq/L (ref 3.5–5.1)
Sodium: 143 mEq/L (ref 135–145)
Total Bilirubin: 0.8 mg/dL (ref 0.2–1.2)
Total Protein: 6.8 g/dL (ref 6.0–8.3)

## 2017-03-08 LAB — LIPID PANEL
Cholesterol: 133 mg/dL (ref 0–200)
HDL: 58.9 mg/dL (ref >39.00)
LDL Cholesterol: 62 mg/dL (ref 0–99)
NonHDL: 73.63
Total CHOL/HDL Ratio: 2
Triglycerides: 56 mg/dL (ref 0.0–149.0)
VLDL: 11.2 mg/dL (ref 0.0–40.0)

## 2017-03-08 LAB — HEMOGLOBIN A1C: Hgb A1c MFr Bld: 5.6 % (ref 4.6–6.5)

## 2017-03-08 MED ORDER — ZOSTER VAC RECOMB ADJUVANTED 50 MCG/0.5ML IM SUSR: 0.5000 mL | Freq: Once | INTRAMUSCULAR | 0 refills | Status: AC

## 2017-03-08 NOTE — Assessment & Plan Note (Signed)
Hip replacement scheduled

## 2017-03-08 NOTE — Patient Instructions (Signed)
Hip Pain The hip is the joint between the upper legs and the lower pelvis. The bones, cartilage, tendons, and muscles of your hip joint support your body and allow you to move around. Hip pain can range from a minor ache to severe pain in one or both of your hips. The pain may be felt on the inside of the hip joint near the groin, or the outside near the buttocks and upper thigh. You may also have swelling or stiffness. Follow these instructions at home: Managing pain, stiffness, and swelling   If directed, apply ice to the injured area.  Put ice in a plastic bag.  Place a towel between your skin and the bag.  Leave the ice on for 20 minutes, 2-3 times a day  Sleep with a pillow between your legs on your most comfortable side.  Avoid any activities that cause pain. General instructions   Take over-the-counter and prescription medicines only as told by your health care provider.  Do any exercises as told by your health care provider.  Record the following:  How often you have hip pain.  The location of your pain.  What the pain feels like.  What makes the pain worse.  Keep all follow-up visits as told by your health care provider. This is important. Contact a health care provider if:  You cannot put weight on your leg.  Your pain or swelling continues or gets worse after one week.  It gets harder to walk.  You have a fever. Get help right away if:  You fall.  You have a sudden increase in pain and swelling in your hip.  Your hip is red or swollen or very tender to touch. Summary  Hip pain can range from a minor ache to severe pain in one or both of your hips.  The pain may be felt on the inside of the hip joint near the groin, or the outside near the buttocks and upper thigh.  Avoid any activities that cause pain.  Record how often you have hip pain, the location of the pain, what makes it worse and what it feels like. This information is not intended to  replace advice given to you by your health care provider. Make sure you discuss any questions you have with your health care provider. Document Released: 08/26/2009 Document Revised: 02/09/2016 Document Reviewed: 02/09/2016 Elsevier Interactive Patient Education  2017 Elsevier Inc.  

## 2017-03-08 NOTE — Assessment & Plan Note (Signed)
Well controlled, no changes to meds. Encouraged heart healthy diet such as the DASH diet and exercise as tolerated.  °

## 2017-03-08 NOTE — Progress Notes (Signed)
Subjective:  I acted as a Education administrator for [employer], [place]   Patient ID: Eric [name], male    DOB: 1946/02/27, 71 y.o.   MRN: [id number]  Chief Complaint  Patient presents with  . Follow-up    Pt needs a surgical clearance for surgery next month.   . Hip Pain    HPIL Patient is in today for pre  op clearance.  Pt is having R hip replacement with dr Mayer Camel in Jan.   No hx of problems with anesthesia  Patient Care Team: Carollee Herter, Alferd Apa, DO as PCP - General Barbaraann Barthel Sharyn Lull, MD as Consulting Physician (Sports Medicine) Marchia Bond, MD as Consulting Physician (Orthopedic Surgery)   Past Medical History:  Diagnosis Date  . Colon polyps   . Diverticulosis of colon   . Dysrhythmia    Palpations from caffiene  . GERD (gastroesophageal reflux disease)   . History of hay fever   . Hx of cardiovascular stress test    Lexiscan Myoview (04/2013):  No ischemia, EF 61%; low risk  . Hyperlipemia   . Hypertension   . Internal hemorrhoids without mention of complication   . Osteoarthrosis, unspecified whether generalized or localized, unspecified site   . 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 Primary localized osteoarthrosis of right shoulder 05/25/2016  . Recent weight loss    75lbs  . Seasonal allergies   . Sleep apnea    CPAP  . Thyroid nodule   . Tremor of both hands     Past Surgical History:  Procedure Laterality Date  . COLONOSCOPY    . SHOULDER SURGERY  Jun 29, 2007   right  . TONSILLECTOMY    . TOTAL SHOULDER ARTHROPLASTY Left 10/14/2015   Procedure: LEFT TOTAL SHOULDER ARTHROPLASTY;  Surgeon: Marchia Bond, MD;  Location: [facility];  Service: Orthopedics;  Laterality: Left;  . TOTAL SHOULDER ARTHROPLASTY Right 05/25/2016   Procedure: TOTAL SHOULDER ARTHROPLASTY;  Surgeon: Marchia Bond, MD;  Location: [facility];  Service: Orthopedics;  Laterality: Right;    Family History  Problem Relation Age of Onset  . Arthritis Mother   . Cancer Mother 17   breast  . Hyperlipidemia Mother   . Colon polyps Mother 48  . Heart disease Father        CAD--passed away 06/29/11 age 39  . Hyperlipidemia Father   . Hypertension Father   . Cancer Sister 68       breast  . Cancer Brother 53       prostate  . Breast cancer Unknown   . Prostate cancer Unknown   . Colon cancer Neg Hx     Social History   Socioeconomic History  . Marital status: Married    Spouse name: Not on file  . Number of children: 3  . Years of education: Not on file  . Highest education level: Not on file  Social Needs  . Financial resource strain: Not on file  . Food insecurity - worry: Not on file  . Food insecurity - inability: Not on file  . Transportation needs - medical: Not on file  . Transportation needs - non-medical: Not on file  Occupational History  . Occupation: Retired--[employer], [employer]  Tobacco Use  . Smoking status: Never Smoker  . Smokeless tobacco: Never Used  Substance and Sexual Activity  . Alcohol use: Yes    Alcohol/week: 0.0 oz    Comment: rare----12 a year  . Drug use: No  . Sexual activity:  Yes    Partners: Female  Other Topics Concern  . Not on file  Social History Narrative   Lives with wife and daughter and two grandchildren.     Outpatient Medications Prior to Visit  Medication Sig Dispense Refill  . acetaminophen (TYLENOL) 500 MG tablet Take 1,000 mg by mouth every 6 (six) hours as needed (for pain.).    [name] atorvastatin (LIPITOR) 40 MG tablet TAKE 1 TABLET DAILY 90 tablet 1  . baclofen (LIORESAL) 10 MG tablet Take 1 tablet (10 mg total) by mouth 3 (three) times daily. As needed for muscle spasm 50 tablet 0  . Coenzyme Q10 (COQ10) 200 MG CAPS Take 200 mg by mouth at bedtime.    . fluticasone (FLONASE) 50 MCG/ACT nasal spray Place 1 spray into both nostrils daily as needed for allergies or rhinitis.    [name] HYDROcodone-acetaminophen (NORCO) 10-325 MG tablet Take 1-2 tablets by mouth every 6 (six) hours as needed. 50 tablet 0  .  loratadine (CLARITIN) 10 MG tablet Take 10 mg by mouth daily.    . meloxicam (MOBIC) 15 MG tablet TAKE 1 TABLET DAILY 90 tablet 0  . Multiple Vitamin (MULTIVITAMIN WITH MINERALS) TABS tablet Take 1 tablet by mouth daily.    . mupirocin ointment (BACTROBAN) 2 % Place 1 application into the nose 2 (two) times daily as needed (for skin tear/irritation.).    Mable Fill (ALLERGY EYE OP) Place 1-2 drops into both eyes 3 (three) times daily as needed (for dry/itching).    . NONFORMULARY OR COMPOUNDED ITEM apap cpap maching--auto adjusting pressure  dx sleep apnea 1 each 0  . ondansetron (ZOFRAN) 4 MG tablet Take 1 tablet (4 mg total) by mouth every 8 (eight) hours as needed for nausea or vomiting. 30 tablet 0  . pantoprazole (PROTONIX) 40 MG tablet TAKE 1 TABLET DAILY 90 tablet 3  . sennosides-docusate sodium (SENOKOT-S) 8.6-50 MG tablet Take 2 tablets by mouth daily. 30 tablet 1  . tamsulosin (FLOMAX) 0.4 MG CAPS capsule TAKE 1 CAPSULE DAILY 90 capsule 3   No facility-administered medications prior to visit.     Allergies  Allergen Reactions  . Lisinopril Cough    Review of Systems  Constitutional: Negative for chills, fever and malaise/fatigue.  HENT: Negative for congestion and hearing loss.   Eyes: Negative for discharge.  Respiratory: Negative for cough, sputum production and shortness of breath.   Cardiovascular: Negative for chest pain, palpitations and leg swelling.  Gastrointestinal: Negative for abdominal pain, blood in stool, constipation, diarrhea, heartburn, nausea and vomiting.  Genitourinary: Negative for dysuria, frequency, hematuria and urgency.  Musculoskeletal: Positive for joint pain. Negative for back pain, falls and myalgias.  Skin: Negative for rash.  Neurological: Negative for dizziness, sensory change, loss of consciousness, weakness and headaches.  Endo/Heme/Allergies: Negative for environmental allergies. Does not bruise/bleed easily.    Psychiatric/Behavioral: Negative for depression and suicidal ideas. The patient is not nervous/anxious and does not have insomnia.        Objective:    Physical Exam  Constitutional: He is oriented to person, place, and time. Vital signs are normal. He appears well-developed and well-nourished. He is sleeping.  HENT:  Head: Normocephalic and atraumatic.  Mouth/Throat: Oropharynx is clear and moist.  Eyes: EOM are normal. Pupils are equal, round, and reactive to light.  Neck: Normal range of motion. Neck supple. No thyromegaly present.  Cardiovascular: Normal rate and regular rhythm.  No murmur heard. Pulmonary/Chest: Effort normal and breath sounds normal. No respiratory  distress. He has no wheezes. He has no rales. He exhibits no tenderness.  Musculoskeletal: He exhibits tenderness. He exhibits no edema.       Left hip: He exhibits decreased range of motion and tenderness.  Neurological: He is alert and oriented to person, place, and time.  Skin: Skin is warm and dry.  Psychiatric: He has a normal mood and affect. His behavior is normal. Judgment and thought content normal.  Nursing note and vitals reviewed.   BP 136/76   Pulse 70   Temp 97.8 F (36.6 C) (Oral)   Ht 5\' 8"  (1.727 m)   Wt 257 lb (116.6 kg)   SpO2 97%   BMI 39.08 kg/m  Wt Readings from Last 3 Encounters:  03/08/17 257 lb (116.6 kg)  12/20/16 235 lb (106.6 kg)  11/18/16 238 lb (108 kg)   BP Readings from Last 3 Encounters:  03/08/17 136/76  12/20/16 104/67  12/16/16 (!) 179/87     Immunization History  Administered Date(s) Administered  . Influenza Split 12/24/2011  . Influenza Whole 01/01/2009  . Influenza, High Dose Seasonal PF 11/26/2013, 01/01/2015, 11/18/2016  . Influenza,inj,Quad PF,6+ Mos 01/15/2013  . Influenza-Unspecified 12/15/2015  . Pneumococcal Conjugate-13 02/11/2014  . Pneumococcal Polysaccharide-23 12/24/2011  . Td 05/01/2008  . Tdap 01/01/2015  . Zoster 10/08/2010  . Zoster  Recombinat (Shingrix) 11/24/2016    Health Maintenance  Topic Date Due  . COLONOSCOPY  03/19/2018  . TETANUS/TDAP  12/31/2024  . INFLUENZA VACCINE  Completed  . Hepatitis C Screening  Completed  . PNA vac Low Risk Adult  Completed    Lab Results  Component Value Date   WBC 9.5 05/26/2016   HGB 13.5 05/26/2016   HCT 41.4 05/26/2016   PLT 157 05/26/2016   GLUCOSE 94 11/18/2016   CHOL 137 11/18/2016   TRIG 69.0 11/18/2016   HDL 50.10 11/18/2016   LDLCALC 74 11/18/2016   ALT 16 11/18/2016   AST 16 11/18/2016   NA 138 11/18/2016   K 4.4 11/18/2016   CL 101 11/18/2016   CREATININE 0.98 11/18/2016   BUN 21 11/18/2016   CO2 30 11/18/2016   TSH 0.62 10/03/2015   PSA 0.9 04/23/2016   HGBA1C 5.5 11/18/2016   MICROALBUR 1.5 12/25/2012    Lab Results  Component Value Date   TSH 0.62 10/03/2015   Lab Results  Component Value Date   WBC 9.5 05/26/2016   HGB 13.5 05/26/2016   HCT 41.4 05/26/2016   MCV 93.5 05/26/2016   PLT 157 05/26/2016   Lab Results  Component Value Date   NA 138 11/18/2016   K 4.4 11/18/2016   CO2 30 11/18/2016   GLUCOSE 94 11/18/2016   BUN 21 11/18/2016   CREATININE 0.98 11/18/2016   BILITOT 1.1 11/18/2016   ALKPHOS 61 11/18/2016   AST 16 11/18/2016   ALT 16 11/18/2016   PROT 7.2 11/18/2016   ALBUMIN 4.6 11/18/2016   CALCIUM 9.9 11/18/2016   ANIONGAP 8 05/26/2016   GFR 80.24 11/18/2016   Lab Results  Component Value Date   CHOL 137 11/18/2016   Lab Results  Component Value Date   HDL 50.10 11/18/2016   Lab Results  Component Value Date   LDLCALC 74 11/18/2016   Lab Results  Component Value Date   TRIG 69.0 11/18/2016   Lab Results  Component Value Date   CHOLHDL 3 11/18/2016   Lab Results  Component Value Date   HGBA1C 5.5 11/18/2016  Assessment & Plan:   Problem List Items Addressed This Visit      Unprioritized   Essential hypertension    Well controlled, no changes to meds. Encouraged heart healthy diet  such as the DASH diet and exercise as tolerated.       Relevant Orders   Lipid panel   Hemoglobin A1c   Comprehensive metabolic panel   Hyperglycemia    Check labs       Relevant Orders   Hemoglobin A1c   Comprehensive metabolic panel   Hyperlipidemia LDL goal <100    Tolerating statin, encouraged heart healthy diet, avoid trans fats, minimize simple carbs and saturated fats. Increase exercise as tolerated      Relevant Orders   Lipid panel   Hemoglobin A1c   Comprehensive metabolic panel   Left hip pain - Primary    Hip replacement scheduled        Other Visit Diagnoses    Need for shingles vaccine       Relevant Medications   Zoster Vaccine Adjuvanted [facility]) injection      I am having Toney Rakes start on Zoster Vaccine Adjuvanted. I am also having him maintain his loratadine, fluticasone, NONFORMULARY OR COMPOUNDED ITEM, multivitamin with minerals, CoQ10, mupirocin ointment, Naphazoline-Pheniramine (ALLERGY EYE OP), acetaminophen, ondansetron, baclofen, sennosides-docusate sodium, HYDROcodone-acetaminophen, tamsulosin, pantoprazole, atorvastatin, and meloxicam.  Meds ordered this encounter  Medications  . Zoster Vaccine Adjuvanted [facility]) injection    Sig: Inject 0.5 mLs into the muscle once for 1 dose.    Dispense:  0.5 mL    Refill:  0    CMA served as scribe during this visit. History, Physical and Plan performed by medical provider. Documentation and orders reviewed and attested to.   [name], DO

## 2017-03-08 NOTE — Assessment & Plan Note (Signed)
Check labs 

## 2017-03-08 NOTE — Assessment & Plan Note (Signed)
Tolerating statin, encouraged heart healthy diet, avoid trans fats, minimize simple carbs and saturated fats. Increase exercise as tolerated 

## 2017-03-14 ENCOUNTER — Encounter (HOSPITAL_COMMUNITY): Payer: Self-pay

## 2017-03-14 NOTE — Pre-Procedure Instructions (Addendum)
Eric [name]  03/14/2017      [facility], [place] - [address] [place] [address] Phone: [phone] Fax: [phone]    Your procedure is scheduled on Jan. 7  Report to [facility] Admitting at 8:20 A.M.  Call this number if you have problems the morning of surgery:  [phone]   Remember:  Do not eat food or drink liquids after midnight on Jan. 6   Take these medicines the morning of surgery with A SIP OF WATER : tylenol if needed, baclofen if needed, flonase if needed,hydrocodone if needed,clatitin if needed, eye drops, pantoprazole (protonix),flomas (tamsulosin)                           7 days prior to surgery STOP taking any Aspirin(unless otherwise instructed by your surgeon), Aleve, Naproxen, Ibuprofen, Motrin, Advil, Goody's, BC's, all herbal medications, fish oil, and all vitamins   Do not wear jewelry, make-up or nail polish.  Do not wear lotions, powders, or perfumes, or deodorant.  Do not shave 48 hours prior to surgery.  Men may shave face and neck.  Do not bring valuables to the hospital.  [facility] is not responsible for any belongings or valuables.  Contacts, dentures or bridgework may not be worn into surgery.  Leave your suitcase in the car.  After surgery it may be brought to your room.  For patients admitted to the hospital, discharge time will be determined by your treatment team.  Patients discharged the day of surgery will not be allowed to drive home.    Special instructions:  [place]- Preparing For Surgery  Before surgery, you can play an important role. Because skin is not sterile, your skin needs to be as free of germs as possible. You can reduce the number of germs on your skin by washing with CHG (chlorahexidine gluconate) Soap before surgery.  CHG is an antiseptic cleaner which kills germs and bonds with the skin to continue killing germs even after  washing.  Please do not use if you have an allergy to CHG or antibacterial soaps. If your skin becomes reddened/irritated stop using the CHG.  Do not shave (including legs and underarms) for at least 48 hours prior to first CHG shower. It is OK to shave your face.  Please follow these instructions carefully.   1. Shower the NIGHT BEFORE SURGERY and the MORNING OF SURGERY with CHG.   2. If you chose to wash your hair, wash your hair first as usual with your normal shampoo.  3. After you shampoo, rinse your hair and body thoroughly to remove the shampoo.  4. Use CHG as you would any other liquid soap. You can apply CHG directly to the skin and wash gently with a scrungie or a clean washcloth.   5. Apply the CHG Soap to your body ONLY FROM THE NECK DOWN.  Do not use on open wounds or open sores. Avoid contact with your eyes, ears, mouth and genitals (private parts). Wash Face and genitals (private parts)  with your normal soap.  6. Wash thoroughly, paying special attention to the area where your surgery will be performed.  7. Thoroughly rinse your body with warm water from the neck down.  8. DO NOT shower/wash with your normal soap after using and rinsing off the CHG Soap.  9. Pat yourself dry with a  CLEAN TOWEL.  10. Wear CLEAN PAJAMAS to bed the night before surgery, wear comfortable clothes the morning of surgery  11. Place CLEAN SHEETS on your bed the night of your first shower and DO NOT SLEEP WITH PETS.    Day of Surgery: Do not apply any deodorants/lotions. Please wear clean clothes to the hospital/surgery center.      Please read over the following fact sheets that you were given. Coughing and Deep Breathing, Total Joint Packet, MRSA Information and Surgical Site Infection Prevention

## 2017-03-14 NOTE — Pre-Procedure Instructions (Signed)
GIVANNI STARON  03/14/2017      [facility], [place] - [address] [place] [address] [place]  [address] Phone: [phone] Fax: [phone]    Your procedure is scheduled on Jan. 7  Report to [facility] Admitting at 8:20 A.M.  Call this number if you have problems the morning of surgery:  [phone]   Remember:  Do not eat food or drink liquids after midnight on Jan. 6   Take these medicines the morning of surgery with A SIP OF WATER : tylenol if needed, baclofen if needed, flonase if needed,hydrocodone if needed,clatitin if needed, eye drops, pantoprazole (protonix),flomas (tamsulosin)                           7 days prior to surgery STOP taking any Aspirin(unless otherwise instructed by your surgeon), Aleve, Naproxen, Ibuprofen, Motrin, Advil, Goody's, BC's, all herbal medications, fish oil, and all vitamins   Do not wear jewelry, make-up or nail polish.  Do not wear lotions, powders, or perfumes, or deodorant.  Do not shave 48 hours prior to surgery.  Men may shave face and neck.  Do not bring valuables to the hospital.  [facility] is not responsible for any belongings or valuables.  Contacts, dentures or bridgework may not be worn into surgery.  Leave your suitcase in the car.  After surgery it may be brought to your room.  For patients admitted to the hospital, discharge time will be determined by your treatment team.  Patients discharged the day of surgery will not be allowed to drive home.    Special instructions:  [facility]- Preparing For Surgery  Before surgery, you can play an important role. Because skin is not sterile, your skin needs to be as free of germs as possible. You can reduce the number of germs on your skin by washing with CHG (chlorahexidine gluconate) Soap before surgery.  CHG is an antiseptic cleaner which kills germs and bonds with the skin to continue killing germs even after  washing.  Please do not use if you have an allergy to CHG or antibacterial soaps. If your skin becomes reddened/irritated stop using the CHG.  Do not shave (including legs and underarms) for at least 48 hours prior to first CHG shower. It is OK to shave your face.  Please follow these instructions carefully.   1. Shower the NIGHT BEFORE SURGERY and the MORNING OF SURGERY with CHG.   2. If you chose to wash your hair, wash your hair first as usual with your normal shampoo.  3. After you shampoo, rinse your hair and body thoroughly to remove the shampoo.  4. Use CHG as you would any other liquid soap. You can apply CHG directly to the skin and wash gently with a scrungie or a clean washcloth.   5. Apply the CHG Soap to your body ONLY FROM THE NECK DOWN.  Do not use on open wounds or open sores. Avoid contact with your eyes, ears, mouth and genitals (private parts). Wash Face and genitals (private parts)  with your normal soap.  6. Wash thoroughly, paying special attention to the area where your surgery will be performed.  7. Thoroughly rinse your body with warm water from the neck down.  8. DO NOT shower/wash with your normal soap after using and rinsing off the CHG Soap.  9. Pat yourself dry with a  CLEAN TOWEL.  10. Wear CLEAN PAJAMAS to bed the night before surgery, wear comfortable clothes the morning of surgery  11. Place CLEAN SHEETS on your bed the night of your first shower and DO NOT SLEEP WITH PETS.    Day of Surgery: Do not apply any deodorants/lotions. Please wear clean clothes to the hospital/surgery center.      Please read over the following fact sheets that you were given. Coughing and Deep Breathing, Total Joint Packet, MRSA Information and Surgical Site Infection Prevention

## 2017-03-16 ENCOUNTER — Encounter (HOSPITAL_COMMUNITY)
Admission: RE | Admit: 2017-03-16 | Discharge: 2017-03-16 | Disposition: A | Discharge status: Home / Self Care | Payer: Medicare Other | Source: Ambulatory Visit | Attending: Orthopedic Surgery | Admitting: Orthopedic Surgery

## 2017-03-16 ENCOUNTER — Ambulatory Visit (HOSPITAL_COMMUNITY)
Admission: RE | Admit: 2017-03-16 | Discharge: 2017-03-16 | Disposition: A | Discharge status: Home / Self Care | Payer: Medicare Other | Source: Ambulatory Visit | Attending: Orthopedic Surgery | Admitting: Orthopedic Surgery

## 2017-03-16 ENCOUNTER — Encounter (HOSPITAL_COMMUNITY): Payer: Self-pay

## 2017-03-16 ENCOUNTER — Other Ambulatory Visit: Payer: Self-pay

## 2017-03-16 DIAGNOSIS — R739 Hyperglycemia, unspecified: Secondary | ICD-10-CM | POA: Diagnosis not present

## 2017-03-16 DIAGNOSIS — E785 Hyperlipidemia, unspecified: Secondary | ICD-10-CM | POA: Insufficient documentation

## 2017-03-16 DIAGNOSIS — Z79899 Other long term (current) drug therapy: Secondary | ICD-10-CM | POA: Insufficient documentation

## 2017-03-16 DIAGNOSIS — Z01818 Encounter for other preprocedural examination: Secondary | ICD-10-CM | POA: Diagnosis not present

## 2017-03-16 DIAGNOSIS — M25552 Pain in left hip: Secondary | ICD-10-CM | POA: Insufficient documentation

## 2017-03-16 DIAGNOSIS — I10 Essential (primary) hypertension: Secondary | ICD-10-CM | POA: Diagnosis not present

## 2017-03-16 DIAGNOSIS — Z7982 Long term (current) use of aspirin: Secondary | ICD-10-CM | POA: Diagnosis not present

## 2017-03-16 DIAGNOSIS — G4730 Sleep apnea, unspecified: Secondary | ICD-10-CM | POA: Diagnosis not present

## 2017-03-16 DIAGNOSIS — K219 Gastro-esophageal reflux disease without esophagitis: Secondary | ICD-10-CM | POA: Insufficient documentation

## 2017-03-16 LAB — PROTIME-INR
INR: 0.96
Prothrombin Time: 12.7 seconds (ref 11.4–15.2)

## 2017-03-16 LAB — CBC WITH DIFFERENTIAL/PLATELET
Basophils Absolute: 0 10*3/uL (ref 0.0–0.1)
Basophils Relative: 0 %
Eosinophils Absolute: 0.1 10*3/uL (ref 0.0–0.7)
Eosinophils Relative: 2 %
HCT: 45.8 % (ref 39.0–52.0)
Hemoglobin: 15.3 g/dL (ref 13.0–17.0)
Lymphocytes Relative: 26 %
Lymphs Abs: 1.9 10*3/uL (ref 0.7–4.0)
MCH: 31.4 pg (ref 26.0–34.0)
MCHC: 33.4 g/dL (ref 30.0–36.0)
MCV: 94 fL (ref 78.0–100.0)
Monocytes Absolute: 0.5 10*3/uL (ref 0.1–1.0)
Monocytes Relative: 7 %
Neutro Abs: 4.8 10*3/uL (ref 1.7–7.7)
Neutrophils Relative %: 65 %
Platelets: 177 10*3/uL (ref 150–400)
RBC: 4.87 MIL/uL (ref 4.22–5.81)
RDW: 12.9 % (ref 11.5–15.5)
WBC: 7.2 10*3/uL (ref 4.0–10.5)

## 2017-03-16 LAB — URINALYSIS, ROUTINE W REFLEX MICROSCOPIC
Bilirubin Urine: NEGATIVE
Glucose, UA: NEGATIVE mg/dL
Hgb urine dipstick: NEGATIVE
Ketones, ur: NEGATIVE mg/dL
Leukocytes, UA: NEGATIVE
Nitrite: NEGATIVE
Protein, ur: NEGATIVE mg/dL
Specific Gravity, Urine: 1.011 (ref 1.005–1.030)
pH: 7 (ref 5.0–8.0)

## 2017-03-16 LAB — BASIC METABOLIC PANEL
Anion gap: 9 (ref 5–15)
BUN: 21 mg/dL — ABNORMAL HIGH (ref 6–20)
CO2: 27 mmol/L (ref 22–32)
Calcium: 9.4 mg/dL (ref 8.9–10.3)
Chloride: 103 mmol/L (ref 101–111)
Creatinine, Ser: 1.04 mg/dL (ref 0.61–1.24)
GFR calc Af Amer: >60 mL/min (ref >60)
GFR calc non Af Amer: >60 mL/min (ref >60)
Glucose, Bld: 103 mg/dL — ABNORMAL HIGH (ref 65–99)
Potassium: 4.7 mmol/L (ref 3.5–5.1)
Sodium: 139 mmol/L (ref 135–145)

## 2017-03-16 LAB — ABO/RH: ABO/RH(D): A NEG

## 2017-03-16 LAB — APTT: aPTT: 30 seconds (ref 24–36)

## 2017-03-16 LAB — SURGICAL PCR SCREEN
MRSA, PCR: NEGATIVE
Staphylococcus aureus: NEGATIVE

## 2017-03-16 LAB — TYPE AND SCREEN
ABO/RH(D): A NEG
Antibody Screen: NEGATIVE

## 2017-03-16 NOTE — Progress Notes (Addendum)
PCP: Dr.Lown  Saw a cardiologist several yrs. Ago for palpitations,doesn't remember who, stated it was caffeine related.had stress test 2015, echo 2014 ( in epic under notes Dr. Marlou Porch  Ordered stress test)  Last sleep study >5 yrs.

## 2017-03-17 NOTE — Progress Notes (Signed)
Anesthesia Chart Review:  Pt is a 71 year old male scheduled for L total hip arthroplasty anterior approach on 03/28/2016 with Frederik Pear, M.D.  - PCP is Roma Schanz, DO  - Saw cardiologist Minus Breeding in 2014 for evaluation of palpitations with improvement (but not resolution) off caffeine. 21 day event monitor showed PACs but no sustained arrhythmias. Echo was unremarkable. Nuclear stress test was low risk. No additional medication changes recommended unless palpitations become more bothersome and then would consider adding a calcium channel blocker or beta blocker. Pt does not routinely see cardiology  - ENT is Valorie Roosevelt, MD who follows pt for nontoxic multinodular goiter (needle biopsy benign). Last office visit 05/07/16; 1 year f/u recommended (notes in care everywhere)    PMH includes: HTN, hyperlipidemia, palpitations, thyroid nodule, GERD. Never smoker. BMI 39. S/p R shoulder arthroplasty 05/25/16. S/p L shoulder arthroplasty 10/14/15  Medications include: ASA 81 mg, Lipitor, Protonix  BP (!) 156/77   Pulse (!) 52   Temp 36.6 C (Oral)   Resp 18   Ht 5\' 9"  (1.753 m)   Wt 264 lb (119.7 kg)   SpO2 98%   BMI 38.99 kg/m   Preoperative labs reviewed.    CXR 03/16/17: There is no active cardiopulmonary disease.  EKG 04/23/16: Sinus bradycardia (55 bpm)   04/23/13 Nuclear stress test: Overall Impression: Low risk stress nuclear study no ischemia. LV Ejection Fraction: 61%. LV Wall Motion: NL LV Function; NL Wall Motion Upper normal LV chamber size (173ml). Sensitivity and specificity of study reduced by noted attenuation however this did not seem to greatly affect study interpretation. (Ordered following 03/13/13 ETT that was abnormal.)  01/01/13 Echo: Study Conclusions - Left ventricle: Wall thickness was increased in a pattern of mild LVH. The estimated ejection fraction was 60%. Wall motion was normal; there were no regional wall motion abnormalities. - Left  atrium: The atrium was mildly dilated. - Right ventricle: The cavity size was normal. Systolic function was normal. - Right atrium: The atrium was mildly dilated.  01/03/13-01/23/13 Event Monitor: 39-123 bpm. Average HR 56 bpm. SR with PACs.  If no changes, I anticipate pt can proceed with surgery as scheduled.   Willeen Cass, FNP-BC [facility]/Anesthesiology Phone: [phone] 03/17/2017 9:45 AM

## 2017-03-25 DIAGNOSIS — M1612 Unilateral primary osteoarthritis, left hip: Secondary | ICD-10-CM | POA: Diagnosis present

## 2017-03-25 MED ORDER — TRANEXAMIC ACID 1000 MG/10ML IV SOLN
2000.0000 mg | INTRAVENOUS | Status: AC
  Administered 2017-03-28: 2000 mg via TOPICAL
  Filled 2017-03-25: qty 20

## 2017-03-25 MED ORDER — LACTATED RINGERS IV SOLN
INTRAVENOUS | Status: DC
  Administered 2017-03-28: 08:00:00 via INTRAVENOUS

## 2017-03-25 MED ORDER — DEXTROSE 5 % IV SOLN
3.0000 g | INTRAVENOUS | Status: AC
  Administered 2017-03-28: 3 g via INTRAVENOUS
  Administered 2017-03-28: 2 g via INTRAVENOUS
  Filled 2017-03-25: qty 3000

## 2017-03-25 NOTE — H&P (Signed)
TOTAL HIP ADMISSION H&P  Patient is admitted for left total hip arthroplasty.  Subjective:  Chief Complaint: left hip pain  HPI: Eric [name], 72 y.o. male, has a history of pain and functional disability in the left hip(s) due to arthritis and patient has failed non-surgical conservative treatments for greater than 12 weeks to include NSAID's and/or analgesics, flexibility and strengthening excercises, weight reduction as appropriate and activity modification.  Onset of symptoms was gradual starting 1 years ago with gradually worsening course since that time.The patient noted no past surgery on the left hip(s).  Patient currently rates pain in the left hip at 10 out of 10 with activity. Patient has night pain, worsening of pain with activity and weight bearing, trendelenberg gait, pain that interfers with activities of daily living and pain with passive range of motion. Patient has evidence of subchondral cysts, periarticular osteophytes and joint space narrowing by imaging studies. This condition presents safety issues increasing the risk of falls.  There is no current active infection.  Patient Active Problem List   Diagnosis Date Noted  . Essential hypertension 11/18/2016  . Hyperlipidemia LDL goal <100 11/18/2016  . Hyperglycemia 11/18/2016  . Need for prophylactic vaccination and inoculation against influenza 11/18/2016  . Primary localized osteoarthrosis of right shoulder 05/25/2016  . 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 S/P shoulder replacement 10/14/2015  . Low back pain 09/03/2015  . Left foot pain 04/04/2014  . Left shoulder pain 02/20/2014  . Palpitations 12/25/2012  . Left hip pain 12/25/2012  . Severe obesity (BMI >= 40) ([place]) 12/25/2012  . Acute bronchitis 12/25/2012  . Urgency of urination 12/24/2011  . Urinary frequency 12/24/2011  . Pre-op evaluation 10/08/2010  . Hyperlipidemia 10/08/2010  . DECREASED HEARING 09/04/2009  . INTERNAL  HEMORRHOIDS WITHOUT MENTION COMP 09/04/2009  . ALLERGIC RHINITIS, SEASONAL 09/04/2009  . GERD 09/04/2009  . DIVERTICULOSIS, COLON 09/04/2009  . OSTEOARTHRITIS 09/04/2009  . Sleep apnea 09/04/2009   Past Medical History:  Diagnosis Date  . Colon polyps   . Diverticulosis of colon   . Dysrhythmia    Palpations from caffiene  . GERD (gastroesophageal reflux disease)   . History of hay fever   . Hx of cardiovascular stress test    Lexiscan Myoview (04/2013):  No ischemia, EF 61%; low risk  . Hyperlipemia   . Hypertension   . Internal hemorrhoids without mention of complication   . Osteoarthrosis, unspecified whether generalized or localized, unspecified site   . 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 Primary localized osteoarthrosis of right shoulder 05/25/2016  . Recent weight loss    75lbs  . Seasonal allergies   . Sleep apnea    CPAP  . Thyroid nodule   . Tremor of both hands     Past Surgical History:  Procedure Laterality Date  . COLONOSCOPY    . SHOULDER SURGERY  2009   right  . TONSILLECTOMY    . TOTAL SHOULDER ARTHROPLASTY Left 10/14/2015   Procedure: LEFT TOTAL SHOULDER ARTHROPLASTY;  Surgeon: Marchia Bond, MD;  Location: [place];  Service: Orthopedics;  Laterality: Left;  . TOTAL SHOULDER ARTHROPLASTY Right 05/25/2016   Procedure: TOTAL SHOULDER ARTHROPLASTY;  Surgeon: Marchia Bond, MD;  Location: [place];  Service: Orthopedics;  Laterality: Right;    Current Facility-Administered Medications  Medication Dose Route Frequency Provider Last Rate Last Dose  . [START ON 03/28/2017] ceFAZolin (ANCEF) 3 g in dextrose 5 % 50 mL IVPB  3 g Intravenous To SS-Surg  Frederik Pear, MD      . Derrill Memo ON 03/28/2017] lactated ringers infusion   Intravenous Continuous Frederik Pear, MD      . Derrill Memo ON 03/28/2017] tranexamic acid (CYKLOKAPRON) 2,000 mg in sodium chloride 0.9 % 50 mL Topical Application  5,573 mg Topical To OR Frederik Pear, MD       Current Outpatient Medications   Medication Sig Dispense Refill Last Dose  . acetaminophen (TYLENOL) 500 MG tablet Take 1,000 mg by mouth every 6 (six) hours as needed for moderate pain or headache.    Taking  . aspirin EC 81 MG tablet Take 81 mg by mouth at bedtime.     [name] atorvastatin (LIPITOR) 40 MG tablet TAKE 1 TABLET DAILY (Patient taking differently: Take 40 mg by mouth daily) 90 tablet 1   . baclofen (LIORESAL) 10 MG tablet Take 1 tablet (10 mg total) by mouth 3 (three) times daily. As needed for muscle spasm (Patient taking differently: Take 10 mg by mouth 3 (three) times daily as needed for muscle spasms. ) 50 tablet 0 Taking  . capsaicin (ZOSTRIX) 0.025 % cream Apply 1 application topically daily as needed (for hip pain).     . Coenzyme Q10 (COQ10) 200 MG CAPS Take 200 mg by mouth at bedtime.   Taking  . fluocinonide ointment (LIDEX) 2.20 % Apply 1 application topically daily as needed (for bites).     . fluticasone (FLONASE) 50 MCG/ACT nasal spray Place 1 spray into both nostrils daily as needed for allergies or rhinitis.   Taking  . HYDROcodone-acetaminophen (NORCO) 10-325 MG tablet Take 1-2 tablets by mouth every 6 (six) hours as needed. (Patient taking differently: Take 1-2 tablets by mouth every 6 (six) hours as needed for moderate pain. ) 50 tablet 0 Taking  . loratadine (CLARITIN) 10 MG tablet Take 10 mg by mouth daily as needed for allergies.    Taking  . meloxicam (MOBIC) 15 MG tablet TAKE 1 TABLET DAILY (Patient taking differently: Take 15 mg by mouth daily) 90 tablet 0   . Multiple Vitamin (MULTIVITAMIN WITH MINERALS) TABS tablet Take 1 tablet by mouth daily.   Taking  . mupirocin ointment (BACTROBAN) 2 % Apply 1 application topically 2 (two) times daily as needed (for skin tear/irritation.).    Taking  . Naphazoline-Pheniramine (ALLERGY EYE OP) Place 1-2 drops into both eyes 3 (three) times daily as needed (for dry/itching).   Taking  . ondansetron (ZOFRAN) 4 MG tablet Take 1 tablet (4 mg total) by mouth every  8 (eight) hours as needed for nausea or vomiting. 30 tablet 0 Taking  . pantoprazole (PROTONIX) 40 MG tablet TAKE 1 TABLET DAILY (Patient taking differently: Take 40 mg by mouth daily) 90 tablet 3 Taking  . polyethylene glycol (MIRALAX / GLYCOLAX) packet Take 17 g by mouth daily.     . tamsulosin (FLOMAX) 0.4 MG CAPS capsule TAKE 1 CAPSULE DAILY (Patient taking differently: Take 0.4 mg by mouth daily) 90 capsule 3 Taking  . NONFORMULARY OR COMPOUNDED ITEM apap cpap maching--auto adjusting pressure  dx sleep apnea (Patient not taking: Reported on 03/09/2017) 1 each 0 Not Taking at Unknown time  . sennosides-docusate sodium (SENOKOT-S) 8.6-50 MG tablet Take 2 tablets by mouth daily. (Patient not taking: Reported on 03/09/2017) 30 tablet 1 Not Taking at Unknown time   Allergies  Allergen Reactions  . Lisinopril Cough    Social History   Tobacco Use  . Smoking status: Never Smoker  . Smokeless tobacco: Never Used  Substance Use Topics  . Alcohol use: Yes    Alcohol/week: 0.0 oz    Comment: rare----12 a year    Family History  Problem Relation Age of Onset  . Arthritis Mother   . Cancer Mother 83       breast  . Hyperlipidemia Mother   . Colon polyps Mother 31  . Heart disease Father        CAD--passed away 20-Jun-2011 age 76  . Hyperlipidemia Father   . Hypertension Father   . Cancer Sister 35       breast  . Cancer Brother 2       prostate  . Breast cancer Unknown   . Prostate cancer Unknown   . Colon cancer Neg Hx      Review of Systems  Constitutional: Negative.   HENT: Positive for hearing loss and tinnitus.   Eyes: Negative.   Respiratory: Negative.   Cardiovascular:       HTN  Gastrointestinal: Positive for heartburn.  Genitourinary: Positive for frequency.  Musculoskeletal: Positive for joint pain and myalgias.  Skin: Negative.   Neurological: Positive for tremors.  Endo/Heme/Allergies: Bruises/bleeds easily.  Psychiatric/Behavioral: Negative.      Objective:  Physical Exam  Constitutional: He is oriented to person, place, and time. He appears well-developed and well-nourished.  HENT:  Head: Normocephalic and atraumatic.  Eyes: Pupils are equal, round, and reactive to light.  Neck: Normal range of motion. Neck supple.  Cardiovascular: Intact distal pulses.  Respiratory: Effort normal.  Musculoskeletal: He exhibits tenderness.  Patient walks with a significant left-sided antalgic gait and uses a cane for ambulation.  He has significant discomfort with any attempts at internal rotation on the left.  On the right he has minimal discomfort with internal and external rotation.  He is neurovascularly intact distally.  No scrapes or abrasions on the skin  Neurological: He is alert and oriented to person, place, and time.  Skin: Skin is warm and dry.  Psychiatric: He has a normal mood and affect. His behavior is normal. Judgment and thought content normal.    Vital signs in last 24 hours:    Labs:   Estimated body mass index is 38.99 kg/m as calculated from the following:   Height as of 03/16/17: 5\' 9"  (1.753 m).   Weight as of 03/16/17: 119.7 kg (264 lb).   Imaging Review Plain radiographs demonstrate  AP pelvis and crosstable lateral show erosive end-stage arthritis on the left, bone-on-bone, subchondral cysts peripheral osteophytes.  On the right there is 1 mm of cartilage left.  Assessment/Plan:  End stage arthritis, left hip(s)  The patient history, physical examination, clinical judgement of the provider and imaging studies are consistent with end stage degenerative joint disease of the left hip(s) and total hip arthroplasty is deemed medically necessary. The treatment options including medical management, injection therapy, arthroscopy and arthroplasty were discussed at length. The risks and benefits of total hip arthroplasty were presented and reviewed. The risks due to aseptic loosening, infection, stiffness,  dislocation/subluxation,  thromboembolic complications and other imponderables were discussed.  The patient acknowledged the explanation, agreed to proceed with the plan and consent was signed. Patient is being admitted for inpatient treatment for surgery, pain control, PT, OT, prophylactic antibiotics, VTE prophylaxis, progressive ambulation and ADL's and discharge planning.The patient is planning to be discharged home with home health services

## 2017-03-28 ENCOUNTER — Inpatient Hospital Stay (HOSPITAL_COMMUNITY): Payer: Medicare Other | Admitting: Certified Registered Nurse Anesthetist

## 2017-03-28 ENCOUNTER — Encounter (HOSPITAL_COMMUNITY): Payer: Self-pay

## 2017-03-28 ENCOUNTER — Inpatient Hospital Stay (HOSPITAL_COMMUNITY): Payer: Medicare Other | Admitting: Emergency Medicine

## 2017-03-28 ENCOUNTER — Encounter (HOSPITAL_COMMUNITY)
Admission: RE | Disposition: A | Discharge status: Home / Self Care | Payer: Self-pay | Source: Ambulatory Visit | Attending: Orthopedic Surgery

## 2017-03-28 ENCOUNTER — Other Ambulatory Visit: Payer: Self-pay

## 2017-03-28 ENCOUNTER — Inpatient Hospital Stay (HOSPITAL_COMMUNITY): Payer: Medicare Other

## 2017-03-28 ENCOUNTER — Inpatient Hospital Stay (HOSPITAL_COMMUNITY)
Admission: RE | Admit: 2017-03-28 | Discharge: 2017-03-29 | DRG: 470 | Disposition: A | Discharge status: Home / Self Care | Payer: Medicare Other | Source: Ambulatory Visit | Attending: Orthopedic Surgery | Admitting: Orthopedic Surgery

## 2017-03-28 DIAGNOSIS — Z6838 Body mass index (BMI) 38.0-38.9, adult: Secondary | ICD-10-CM

## 2017-03-28 DIAGNOSIS — I10 Essential (primary) hypertension: Secondary | ICD-10-CM | POA: Diagnosis present

## 2017-03-28 DIAGNOSIS — N401 Enlarged prostate with lower urinary tract symptoms: Secondary | ICD-10-CM | POA: Diagnosis not present

## 2017-03-28 DIAGNOSIS — Z8371 Family history of colonic polyps: Secondary | ICD-10-CM | POA: Diagnosis not present

## 2017-03-28 DIAGNOSIS — H9319 Tinnitus, unspecified ear: Secondary | ICD-10-CM | POA: Diagnosis present

## 2017-03-28 DIAGNOSIS — Z8249 Family history of ischemic heart disease and other diseases of the circulatory system: Secondary | ICD-10-CM

## 2017-03-28 DIAGNOSIS — Z419 Encounter for procedure for purposes other than remedying health state, unspecified: Secondary | ICD-10-CM

## 2017-03-28 DIAGNOSIS — Z8042 Family history of malignant neoplasm of prostate: Secondary | ICD-10-CM

## 2017-03-28 DIAGNOSIS — Z79891 Long term (current) use of opiate analgesic: Secondary | ICD-10-CM

## 2017-03-28 DIAGNOSIS — G4730 Sleep apnea, unspecified: Secondary | ICD-10-CM | POA: Diagnosis present

## 2017-03-28 DIAGNOSIS — Z7982 Long term (current) use of aspirin: Secondary | ICD-10-CM | POA: Diagnosis not present

## 2017-03-28 DIAGNOSIS — M1612 Unilateral primary osteoarthritis, left hip: Secondary | ICD-10-CM | POA: Diagnosis not present

## 2017-03-28 DIAGNOSIS — R3915 Urgency of urination: Secondary | ICD-10-CM | POA: Diagnosis present

## 2017-03-28 DIAGNOSIS — Z8261 Family history of arthritis: Secondary | ICD-10-CM

## 2017-03-28 DIAGNOSIS — H9190 Unspecified hearing loss, unspecified ear: Secondary | ICD-10-CM | POA: Diagnosis present

## 2017-03-28 DIAGNOSIS — Z791 Long term (current) use of non-steroidal anti-inflammatories (NSAID): Secondary | ICD-10-CM

## 2017-03-28 DIAGNOSIS — Z803 Family history of malignant neoplasm of breast: Secondary | ICD-10-CM | POA: Diagnosis not present

## 2017-03-28 DIAGNOSIS — Z79899 Other long term (current) drug therapy: Secondary | ICD-10-CM | POA: Diagnosis not present

## 2017-03-28 DIAGNOSIS — K219 Gastro-esophageal reflux disease without esophagitis: Secondary | ICD-10-CM | POA: Diagnosis not present

## 2017-03-28 DIAGNOSIS — Z8349 Family history of other endocrine, nutritional and metabolic diseases: Secondary | ICD-10-CM | POA: Diagnosis not present

## 2017-03-28 DIAGNOSIS — Z888 Allergy status to other drugs, medicaments and biological substances status: Secondary | ICD-10-CM

## 2017-03-28 DIAGNOSIS — M25752 Osteophyte, left hip: Secondary | ICD-10-CM | POA: Diagnosis present

## 2017-03-28 DIAGNOSIS — Z96611 Presence of right artificial shoulder joint: Secondary | ICD-10-CM | POA: Diagnosis not present

## 2017-03-28 DIAGNOSIS — Z86010 Personal history of colonic polyps: Secondary | ICD-10-CM | POA: Diagnosis not present

## 2017-03-28 DIAGNOSIS — Z96612 Presence of left artificial shoulder joint: Secondary | ICD-10-CM | POA: Diagnosis not present

## 2017-03-28 DIAGNOSIS — E785 Hyperlipidemia, unspecified: Secondary | ICD-10-CM | POA: Diagnosis present

## 2017-03-28 DIAGNOSIS — D62 Acute posthemorrhagic anemia: Secondary | ICD-10-CM | POA: Diagnosis not present

## 2017-03-28 DIAGNOSIS — Z471 Aftercare following joint replacement surgery: Secondary | ICD-10-CM | POA: Diagnosis not present

## 2017-03-28 DIAGNOSIS — Z96642 Presence of left artificial hip joint: Secondary | ICD-10-CM | POA: Insufficient documentation

## 2017-03-28 DIAGNOSIS — R251 Tremor, unspecified: Secondary | ICD-10-CM | POA: Diagnosis present

## 2017-03-28 HISTORY — DX: Osteoarthritis of hip, unspecified: M16.9

## 2017-03-28 HISTORY — DX: Benign prostatic hyperplasia without lower urinary tract symptoms: N40.0

## 2017-03-28 HISTORY — PX: TOTAL HIP ARTHROPLASTY: SHX124

## 2017-03-28 LAB — GLUCOSE, CAPILLARY: Glucose-Capillary: 113 mg/dL — ABNORMAL HIGH (ref 65–99)

## 2017-03-28 SURGERY — ARTHROPLASTY, HIP, TOTAL, ANTERIOR APPROACH
Anesthesia: Monitor Anesthesia Care | Site: Hip | Laterality: Left

## 2017-03-28 MED ORDER — POLYETHYLENE GLYCOL 3350 17 G PO PACK: 17.0000 g | PACK | Freq: Every day | ORAL | Status: DC | PRN

## 2017-03-28 MED ORDER — ACETAMINOPHEN 325 MG PO TABS: 650.0000 mg | ORAL_TABLET | ORAL | Status: DC | PRN

## 2017-03-28 MED ORDER — DOCUSATE SODIUM 100 MG PO CAPS
100.0000 mg | ORAL_CAPSULE | Freq: Two times a day (BID) | ORAL | Status: DC
  Administered 2017-03-28 – 2017-03-29 (×2): 100 mg via ORAL
  Filled 2017-03-28 (×2): qty 1

## 2017-03-28 MED ORDER — MUPIROCIN 2 % EX OINT: 1.0000 "application " | TOPICAL_OINTMENT | Freq: Two times a day (BID) | CUTANEOUS | Status: DC | PRN

## 2017-03-28 MED ORDER — PANTOPRAZOLE SODIUM 40 MG PO TBEC
40.0000 mg | DELAYED_RELEASE_TABLET | Freq: Every day | ORAL | Status: DC
  Administered 2017-03-29: 40 mg via ORAL
  Filled 2017-03-28: qty 1

## 2017-03-28 MED ORDER — KCL IN DEXTROSE-NACL 20-5-0.45 MEQ/L-%-% IV SOLN
INTRAVENOUS | Status: DC
  Administered 2017-03-28 – 2017-03-29 (×2): via INTRAVENOUS
  Filled 2017-03-28 (×2): qty 1000

## 2017-03-28 MED ORDER — METOCLOPRAMIDE HCL 5 MG PO TABS: 5.0000 mg | ORAL_TABLET | Freq: Three times a day (TID) | ORAL | Status: DC | PRN

## 2017-03-28 MED ORDER — FENTANYL CITRATE (PF) 100 MCG/2ML IJ SOLN: 25.0000 ug | INTRAMUSCULAR | Status: DC | PRN

## 2017-03-28 MED ORDER — OXYCODONE HCL 5 MG PO TABS: 5.0000 mg | ORAL_TABLET | ORAL | Status: DC | PRN

## 2017-03-28 MED ORDER — EPHEDRINE SULFATE 50 MG/ML IJ SOLN
INTRAMUSCULAR | Status: DC | PRN
  Administered 2017-03-28: 5 mg via INTRAVENOUS
  Administered 2017-03-28: 10 mg via INTRAVENOUS

## 2017-03-28 MED ORDER — TRANEXAMIC ACID 1000 MG/10ML IV SOLN
1000.0000 mg | INTRAVENOUS | Status: AC
  Administered 2017-03-28 (×2): 1000 mg via INTRAVENOUS
  Filled 2017-03-28: qty 10

## 2017-03-28 MED ORDER — METOCLOPRAMIDE HCL 5 MG/ML IJ SOLN: 5.0000 mg | Freq: Three times a day (TID) | INTRAMUSCULAR | Status: DC | PRN

## 2017-03-28 MED ORDER — FLEET ENEMA 7-19 GM/118ML RE ENEM: 1.0000 | ENEMA | Freq: Once | RECTAL | Status: DC | PRN

## 2017-03-28 MED ORDER — TAMSULOSIN HCL 0.4 MG PO CAPS
0.4000 mg | ORAL_CAPSULE | Freq: Every day | ORAL | Status: DC
  Administered 2017-03-28 – 2017-03-29 (×2): 0.4 mg via ORAL
  Filled 2017-03-28 (×2): qty 1

## 2017-03-28 MED ORDER — HYDROMORPHONE HCL 1 MG/ML IJ SOLN
0.5000 mg | INTRAMUSCULAR | Status: DC | PRN
  Administered 2017-03-28 – 2017-03-29 (×2): 0.5 mg via INTRAVENOUS
  Filled 2017-03-28 (×2): qty 1

## 2017-03-28 MED ORDER — OXYCODONE HCL 5 MG PO TABS: 5.0000 mg | ORAL_TABLET | Freq: Once | ORAL | Status: DC | PRN

## 2017-03-28 MED ORDER — BUPIVACAINE-EPINEPHRINE (PF) 0.5% -1:200000 IJ SOLN
INTRAMUSCULAR | Status: DC | PRN
  Administered 2017-03-28: 50 mL

## 2017-03-28 MED ORDER — PHENYLEPHRINE HCL 10 MG/ML IJ SOLN
INTRAVENOUS | Status: DC | PRN
  Administered 2017-03-28: 20 ug/min via INTRAVENOUS

## 2017-03-28 MED ORDER — ACETAMINOPHEN 325 MG PO TABS: 325.0000 mg | ORAL_TABLET | ORAL | Status: DC | PRN

## 2017-03-28 MED ORDER — MENTHOL 3 MG MT LOZG: 1.0000 | LOZENGE | OROMUCOSAL | Status: DC | PRN

## 2017-03-28 MED ORDER — ALUMINUM HYDROXIDE GEL 320 MG/5ML PO SUSP
15.0000 mL | ORAL | Status: DC | PRN
  Filled 2017-03-28: qty 30

## 2017-03-28 MED ORDER — OXYCODONE HCL 5 MG/5ML PO SOLN: 5.0000 mg | Freq: Once | ORAL | Status: DC | PRN

## 2017-03-28 MED ORDER — MIDAZOLAM HCL 5 MG/5ML IJ SOLN
INTRAMUSCULAR | Status: DC | PRN
  Administered 2017-03-28: 2 mg via INTRAVENOUS

## 2017-03-28 MED ORDER — NAPHAZOLINE-PHENIRAMINE 0.025-0.3 % OP SOLN: 1.0000 [drp] | Freq: Three times a day (TID) | OPHTHALMIC | Status: DC | PRN

## 2017-03-28 MED ORDER — BISACODYL 5 MG PO TBEC: 5.0000 mg | DELAYED_RELEASE_TABLET | Freq: Every day | ORAL | Status: DC | PRN

## 2017-03-28 MED ORDER — FLUOCINONIDE 0.05 % EX OINT: 1.0000 "application " | TOPICAL_OINTMENT | Freq: Every day | CUTANEOUS | Status: DC | PRN

## 2017-03-28 MED ORDER — METHOCARBAMOL 1000 MG/10ML IJ SOLN
500.0000 mg | Freq: Four times a day (QID) | INTRAMUSCULAR | Status: DC | PRN
  Filled 2017-03-28: qty 5

## 2017-03-28 MED ORDER — OXYCODONE-ACETAMINOPHEN 5-325 MG PO TABS: 1.0000 | ORAL_TABLET | ORAL | 0 refills | Status: DC | PRN

## 2017-03-28 MED ORDER — ASPIRIN EC 325 MG PO TBEC: 325.0000 mg | DELAYED_RELEASE_TABLET | Freq: Two times a day (BID) | ORAL | 0 refills | Status: DC

## 2017-03-28 MED ORDER — BUPIVACAINE LIPOSOME 1.3 % IJ SUSP
20.0000 mL | Freq: Once | INTRAMUSCULAR | Status: DC
  Filled 2017-03-28: qty 20

## 2017-03-28 MED ORDER — FENTANYL CITRATE (PF) 250 MCG/5ML IJ SOLN
INTRAMUSCULAR | Status: AC
  Filled 2017-03-28: qty 5

## 2017-03-28 MED ORDER — ONDANSETRON HCL 4 MG PO TABS: 4.0000 mg | ORAL_TABLET | Freq: Four times a day (QID) | ORAL | Status: DC | PRN

## 2017-03-28 MED ORDER — ONDANSETRON HCL 4 MG/2ML IJ SOLN
INTRAMUSCULAR | Status: DC | PRN
  Administered 2017-03-28: 4 mg via INTRAVENOUS

## 2017-03-28 MED ORDER — PROPOFOL 500 MG/50ML IV EMUL
INTRAVENOUS | Status: DC | PRN
  Administered 2017-03-28: 50 ug/kg/min via INTRAVENOUS

## 2017-03-28 MED ORDER — OXYCODONE HCL 5 MG PO TABS
10.0000 mg | ORAL_TABLET | ORAL | Status: DC | PRN
  Administered 2017-03-28 – 2017-03-29 (×4): 10 mg via ORAL
  Filled 2017-03-28 (×4): qty 2

## 2017-03-28 MED ORDER — GABAPENTIN 300 MG PO CAPS
300.0000 mg | ORAL_CAPSULE | Freq: Three times a day (TID) | ORAL | Status: DC
  Administered 2017-03-28 – 2017-03-29 (×3): 300 mg via ORAL
  Filled 2017-03-28 (×3): qty 1

## 2017-03-28 MED ORDER — ACETAMINOPHEN 160 MG/5ML PO SOLN: 325.0000 mg | ORAL | Status: DC | PRN

## 2017-03-28 MED ORDER — ADULT MULTIVITAMIN W/MINERALS CH
1.0000 | ORAL_TABLET | Freq: Every day | ORAL | Status: DC
  Administered 2017-03-29: 1 via ORAL
  Filled 2017-03-28: qty 1

## 2017-03-28 MED ORDER — TRANEXAMIC ACID 1000 MG/10ML IV SOLN
1000.0000 mg | Freq: Once | INTRAVENOUS | Status: AC
  Administered 2017-03-28: 1000 mg via INTRAVENOUS
  Filled 2017-03-28: qty 10

## 2017-03-28 MED ORDER — DIPHENHYDRAMINE HCL 12.5 MG/5ML PO ELIX: 12.5000 mg | ORAL_SOLUTION | ORAL | Status: DC | PRN

## 2017-03-28 MED ORDER — BACLOFEN 10 MG PO TABS: 10.0000 mg | ORAL_TABLET | Freq: Three times a day (TID) | ORAL | Status: DC | PRN

## 2017-03-28 MED ORDER — ATORVASTATIN CALCIUM 40 MG PO TABS
40.0000 mg | ORAL_TABLET | Freq: Every day | ORAL | Status: DC
  Administered 2017-03-28 – 2017-03-29 (×2): 40 mg via ORAL
  Filled 2017-03-28 (×2): qty 1

## 2017-03-28 MED ORDER — 0.9 % SODIUM CHLORIDE (POUR BTL) OPTIME
TOPICAL | Status: DC | PRN
  Administered 2017-03-28: 1000 mL

## 2017-03-28 MED ORDER — METHOCARBAMOL 500 MG PO TABS
500.0000 mg | ORAL_TABLET | Freq: Four times a day (QID) | ORAL | Status: DC | PRN
  Administered 2017-03-28 – 2017-03-29 (×2): 500 mg via ORAL
  Filled 2017-03-28 (×2): qty 1

## 2017-03-28 MED ORDER — FLUTICASONE PROPIONATE 50 MCG/ACT NA SUSP: 1.0000 | Freq: Every day | NASAL | Status: DC | PRN

## 2017-03-28 MED ORDER — ACETAMINOPHEN 650 MG RE SUPP
650.0000 mg | RECTAL | Status: DC | PRN
Start: 2017-03-28 — End: 2017-03-29

## 2017-03-28 MED ORDER — CHLORHEXIDINE GLUCONATE 4 % EX LIQD: 60.0000 mL | Freq: Once | CUTANEOUS | Status: DC

## 2017-03-28 MED ORDER — BUPIVACAINE-EPINEPHRINE (PF) 0.5% -1:200000 IJ SOLN
INTRAMUSCULAR | Status: AC
  Filled 2017-03-28: qty 60

## 2017-03-28 MED ORDER — BUPIVACAINE LIPOSOME 1.3 % IJ SUSP
INTRAMUSCULAR | Status: DC | PRN
  Administered 2017-03-28: 20 mL

## 2017-03-28 MED ORDER — BUPIVACAINE IN DEXTROSE 0.75-8.25 % IT SOLN
INTRATHECAL | Status: DC | PRN
  Administered 2017-03-28: 2 mL via INTRATHECAL

## 2017-03-28 MED ORDER — ONDANSETRON HCL 4 MG/2ML IJ SOLN: 4.0000 mg | Freq: Four times a day (QID) | INTRAMUSCULAR | Status: DC | PRN

## 2017-03-28 MED ORDER — LORATADINE 10 MG PO TABS: 10.0000 mg | ORAL_TABLET | Freq: Every day | ORAL | Status: DC | PRN

## 2017-03-28 MED ORDER — ASPIRIN EC 325 MG PO TBEC
325.0000 mg | DELAYED_RELEASE_TABLET | Freq: Every day | ORAL | Status: DC
  Administered 2017-03-29: 325 mg via ORAL
  Filled 2017-03-28: qty 1

## 2017-03-28 MED ORDER — DEXAMETHASONE SODIUM PHOSPHATE 10 MG/ML IJ SOLN
10.0000 mg | Freq: Once | INTRAMUSCULAR | Status: AC
  Administered 2017-03-29: 10 mg via INTRAVENOUS
  Filled 2017-03-28: qty 1

## 2017-03-28 MED ORDER — MIDAZOLAM HCL 2 MG/2ML IJ SOLN
INTRAMUSCULAR | Status: AC
  Filled 2017-03-28: qty 2

## 2017-03-28 MED ORDER — PHENOL 1.4 % MT LIQD: 1.0000 | OROMUCOSAL | Status: DC | PRN

## 2017-03-28 MED ORDER — CAPSAICIN 0.025 % EX CREA: 1.0000 "application " | TOPICAL_CREAM | Freq: Every day | CUTANEOUS | Status: DC | PRN

## 2017-03-28 MED ORDER — POLYETHYLENE GLYCOL 3350 17 G PO PACK
17.0000 g | PACK | Freq: Every day | ORAL | Status: DC
  Administered 2017-03-29: 17 g via ORAL
  Filled 2017-03-28: qty 1

## 2017-03-28 MED ORDER — CELECOXIB 200 MG PO CAPS
200.0000 mg | ORAL_CAPSULE | Freq: Two times a day (BID) | ORAL | Status: DC
  Administered 2017-03-28 – 2017-03-29 (×3): 200 mg via ORAL
  Filled 2017-03-28 (×3): qty 1

## 2017-03-28 SURGICAL SUPPLY — 42 items
BAG DECANTER FOR FLEXI CONT (MISCELLANEOUS) ×2 IMPLANT
BLADE SAW SGTL 18X1.27X75 (BLADE) ×2 IMPLANT
CAPT HIP TOTAL 2 ×2 IMPLANT
COVER PERINEAL POST (MISCELLANEOUS) ×2 IMPLANT
COVER SURGICAL LIGHT HANDLE (MISCELLANEOUS) ×2 IMPLANT
DRAPE C-ARM 42X72 X-RAY (DRAPES) ×2 IMPLANT
DRAPE STERI IOBAN 125X83 (DRAPES) ×2 IMPLANT
DRAPE U-SHAPE 47X51 STRL (DRAPES) ×4 IMPLANT
DRSG AQUACEL AG ADV 3.5X10 (GAUZE/BANDAGES/DRESSINGS) ×2 IMPLANT
DURAPREP 26ML APPLICATOR (WOUND CARE) ×2 IMPLANT
ELECT BLADE 4.0 EZ CLEAN MEGAD (MISCELLANEOUS) ×2
ELECT REM PT RETURN 9FT ADLT (ELECTROSURGICAL) ×2
ELECTRODE BLDE 4.0 EZ CLN MEGD (MISCELLANEOUS) ×1 IMPLANT
ELECTRODE REM PT RTRN 9FT ADLT (ELECTROSURGICAL) ×1 IMPLANT
FACESHIELD WRAPAROUND (MASK) ×4 IMPLANT
GLOVE BIO SURGEON STRL SZ7.5 (GLOVE) ×2 IMPLANT
GLOVE BIO SURGEON STRL SZ8.5 (GLOVE) ×2 IMPLANT
GLOVE BIOGEL PI IND STRL 8 (GLOVE) ×1 IMPLANT
GLOVE BIOGEL PI IND STRL 9 (GLOVE) ×1 IMPLANT
GLOVE BIOGEL PI INDICATOR 8 (GLOVE) ×1
GLOVE BIOGEL PI INDICATOR 9 (GLOVE) ×1
GOWN STRL REUS W/ TWL LRG LVL3 (GOWN DISPOSABLE) ×1 IMPLANT
GOWN STRL REUS W/ TWL XL LVL3 (GOWN DISPOSABLE) ×2 IMPLANT
GOWN STRL REUS W/TWL LRG LVL3 (GOWN DISPOSABLE) ×1
GOWN STRL REUS W/TWL XL LVL3 (GOWN DISPOSABLE) ×2
KIT BASIN OR (CUSTOM PROCEDURE TRAY) ×2 IMPLANT
KIT ROOM TURNOVER OR (KITS) ×2 IMPLANT
MANIFOLD NEPTUNE II (INSTRUMENTS) ×2 IMPLANT
NEEDLE HYPO 22GX1.5 SAFETY (NEEDLE) ×4 IMPLANT
NS IRRIG 1000ML POUR BTL (IV SOLUTION) ×2 IMPLANT
PACK TOTAL JOINT (CUSTOM PROCEDURE TRAY) ×2 IMPLANT
PAD ARMBOARD 7.5X6 YLW CONV (MISCELLANEOUS) ×4 IMPLANT
SUT VIC AB 1 CTX 36 (SUTURE) ×1
SUT VIC AB 1 CTX36XBRD ANBCTR (SUTURE) ×1 IMPLANT
SUT VIC AB 2-0 CT1 27 (SUTURE) ×1
SUT VIC AB 2-0 CT1 TAPERPNT 27 (SUTURE) ×1 IMPLANT
SUT VIC AB 3-0 CT1 27 (SUTURE) ×1
SUT VIC AB 3-0 CT1 TAPERPNT 27 (SUTURE) ×1 IMPLANT
SYR CONTROL 10ML LL (SYRINGE) ×4 IMPLANT
TOWEL OR 17X24 6PK STRL BLUE (TOWEL DISPOSABLE) ×2 IMPLANT
TOWEL OR 17X26 10 PK STRL BLUE (TOWEL DISPOSABLE) ×2 IMPLANT
TRAY CATH 16FR W/PLASTIC CATH (SET/KITS/TRAYS/PACK) IMPLANT

## 2017-03-28 NOTE — Op Note (Signed)
OPERATIVE REPORT    DATE OF PROCEDURE:  03/28/2017       PREOPERATIVE DIAGNOSIS:  LEFT HIP OSTEOARTHRITIS                                                          POSTOPERATIVE DIAGNOSIS:  LEFT HIP OSTEOARTHRITIS                                                           PROCEDURE: Anterior L total hip arthroplasty using a 52 mm DePuy Pinnacle  Cup, [employer], 0-degree polyethylene liner, a +1 36 mm ceramic head, a 6 hi Depuy Triloc stem   SURGEON: [name]    ASSISTANT:   Kerry Hough. [employer]  (present throughout entire procedure and necessary for timely completion of the procedure)   ANESTHESIA: Spinal BLOOD LOSS: 400cc FLUID REPLACEMENT: 1600 crystalloid Antibiotic: 3gm ancef Tranexamic Acid: 1gm IV, 2gm Topical COMPLICATIONS: none    INDICATIONS FOR PROCEDURE: A 72 y.o. year-old With  LEFT HIP OSTEOARTHRITIS   for 3 years, x-rays show bone-on-bone arthritic changes, and osteophytes. Despite conservative measures with observation, anti-inflammatory medicine, narcotics, use of a cane, has severe unremitting pain and can ambulate only a few blocks before resting. Patient desires elective L total hip arthroplasty to decrease pain and increase function. The risks, benefits, and alternatives were discussed at length including but not limited to the risks of infection, bleeding, nerve injury, stiffness, blood clots, the need for revision surgery, cardiopulmonary complications, among others, and they were willing to proceed. Questions answered     PROCEDURE IN DETAIL: The patient was identified by armband,  received preoperative IV antibiotics in the holding area at [facility], taken to the operating room , appropriate anesthetic monitors  were attached and  anesthesia was induced with the patienton the gurney. The HANA boots were applied to the feet and he was then transferred to the HANA table with a peroneal post and support underneath the non-operative le,  which was locked in 5 lb traction. Theoperative lower extremity was then prepped and draped in the usual sterile fashion from just above the iliac crest to the knee. And a timeout procedure was performed. We then made a 13 cm incision along the interval at the leading edge of the tensor fascia lata of starting at 2 cm lateral to and 2 cm distal to the ASIS. Small bleeders in the skin and subcutaneous tissue identified and cauterized we dissected down to the fascia and made an incision in the fascia allowing [place] to elevate the fascia of the tensor muscle and exploited the interval between the rectus and the tensor fascia lata. A Hohmann retractor was then placed along the superior neck of the femur and a Cobra retractor along the inferior neck of the femur we teed the capsule starting out at the superior anterior aspect of the acetabulum going distally and made the T along the neck both leaflets of the T were tagged with #2 Ethibond suture. Cobra retractors were then placed along the inferior and superior neck allowing [place] to perform a standard neck cut  and removed the femoral head with a power corkscrew. We then placed a right angle Hohmann retractor along the anterior aspect of the acetabulum a spiked Cobra in the cotyloid notch and posteriorly a Muelller retractor. We then sequentially reamed up to a 51 mm basket reamer obtaining good coverage in all quadrants, verified by C-arm imaging. Under C-arm control with and hammered into place a 52 mm Pinnacle cup in 45 of abduction and 15 of anteversion. The cup seated nicely and required no supplemental screws. We then placed a central hole Eliminator and a 0 polyethylene liner. The foot was then externally rotated to 110, the HANA elevator was placed around the flare of the greater trochanter and the limb was extended and abducted delivering the proximal femur up into the wound. A medium Hohmann retractor was placed over the greater trochanter and a Mueller retractor  along the posterior femoral neck completing the exposure. We then performed releases superiorly and and inferiorly of the capsule going back to the pirformis fossa superiorly and to the lesser trochanter inferiorly. We then entered the proximal femur with the box cutting offset chisel followed by, a canal sounder, the chili pepper and broaching up to a 6 broach. This seated nicely and we reamed the calcar. A trial reduction was performed with a 1 mm 36 mm head.The limb lengths were excellent the hip was stable in 90 of external rotation. At this point the trial components removed and we hammered into place a # 6 hi  Offset Tri-Lock stem with Gryption coating. A + 1 x36 mm ceramic ball was then hammered into place the hip was reduced and final C-arm images obtained. The wound was thoroughly irrigated with normal saline solution. We repaired the ant capsule and the tensor fascia lot a with running 0 vicryl suture. the subcutaneous tissue was closed with 2-0 and 3-0 Vicryl suture followed by an Aquacil dressing. At this point the patient was awaken and transferred to hospital gurney without difficulty. The subcutaneous tissue with 0 and 2-0 undyed Vicryl suture and the skin with running  3-0 vicryl subcuticular suture. Aquacil dressing was applied. The patient was then unclamped, rolled supine, awaken extubated and taken to recovery room without difficulty in stable condition.   [name] 03/28/2017, 11:59 AM

## 2017-03-28 NOTE — Discharge Instructions (Signed)

## 2017-03-28 NOTE — Anesthesia Preprocedure Evaluation (Signed)
Anesthesia Evaluation  Patient identified by MRN, date of birth, ID band Patient awake    Reviewed: Allergy & Precautions, NPO status , Patient's Chart, lab work & pertinent test results  History of Anesthesia Complications Negative for: history of anesthetic complications  Airway Mallampati: II  TM Distance: >3 FB Neck ROM: Full    Dental no notable dental hx. (+) Teeth Intact   Pulmonary neg shortness of breath, sleep apnea and Continuous Positive Airway Pressure Ventilation , neg COPD, neg recent URI,    breath sounds clear to auscultation       Cardiovascular hypertension, Pt. on medications (-) angina(-) Past MI and (-) CHF negative cardio ROS Normal cardiovascular exam+ dysrhythmias  Rhythm:Regular Rate:Normal     Neuro/Psych negative neurological ROS  negative psych ROS   GI/Hepatic negative GI ROS, Neg liver ROS, GERD  Medicated and Controlled,  Endo/Other  Morbid obesity  Renal/GU negative Renal ROS     Musculoskeletal  (+) Arthritis ,   Abdominal   Peds  Hematology negative hematology ROS (+)   Anesthesia Other Findings   Reproductive/Obstetrics                             Anesthesia Physical Anesthesia Plan  ASA: III  Anesthesia Plan: MAC and Spinal   Post-op Pain Management:    Induction:   PONV Risk Score and Plan: 1 and Treatment may vary due to age or medical condition  Airway Management Planned: Nasal Cannula  Additional Equipment:   Intra-op Plan:   Post-operative Plan:   Informed Consent: I have reviewed the patients History and Physical, chart, labs and discussed the procedure including the risks, benefits and alternatives for the proposed anesthesia with the patient or authorized representative who has indicated his/her understanding and acceptance.   Dental advisory given  Plan Discussed with: CRNA and Surgeon  Anesthesia Plan Comments:          Anesthesia Quick Evaluation

## 2017-03-28 NOTE — Progress Notes (Signed)
Orthopedic Tech Progress Note Patient Details:  Eric [name] 1945/12/25 [id number]  Patient ID: Eric [name], male   DOB: 29-Dec-1945, 72 y.o.   MRN: [id number] Pt cant have ohf due to age restrictions  Karolee Stamps 03/28/2017, 7:32 PM

## 2017-03-28 NOTE — Anesthesia Procedure Notes (Signed)
Spinal  Patient location during procedure: OR Start time: 03/28/2017 10:31 AM End time: 03/28/2017 10:36 AM Staffing Anesthesiologist: Oleta Mouse, MD Preanesthetic Checklist Completed: patient identified, surgical consent, pre-op evaluation, timeout performed, IV checked, risks and benefits discussed and monitors and equipment checked Spinal Block Patient position: sitting Prep: site prepped and draped and DuraPrep Patient monitoring: heart rate, cardiac monitor, continuous pulse ox and blood pressure Approach: midline Location: L3-4 Injection technique: single-shot Needle Needle type: Pencan  Needle gauge: 24 G Needle length: 10 cm Assessment Sensory level: T6

## 2017-03-28 NOTE — Transfer of Care (Signed)
Immediate Anesthesia Transfer of Care Note  Patient: Eric [name]  Procedure(s) Performed: TOTAL HIP ARTHROPLASTY ANTERIOR APPROACH (Left Hip)  Patient Location: PACU  Anesthesia Type:Spinal  Level of Consciousness: awake, oriented, drowsy, patient cooperative and responds to stimulation  Airway & Oxygen Therapy: Patient Spontanous Breathing and Patient connected to face mask oxygen  Post-op Assessment: Report given to RN and Post -op Vital signs reviewed and stable  Post vital signs: Reviewed and stable  Last Vitals:  Vitals:   03/28/17 0759  BP: (!) 166/77  Pulse: 65  Resp: 18  Temp: 36.6 C  SpO2: 98%    Last Pain:  Vitals:   03/28/17 0816  TempSrc:   PainSc: 0-No pain      Patients Stated Pain Goal: 4 (43/15/40 0867)  Complications: No apparent anesthesia complications

## 2017-03-28 NOTE — Interval H&P Note (Signed)
History and Physical Interval Note:  03/28/2017 10:03 AM  Eric [name]  has presented today for surgery, with the diagnosis of LEFT HIP OSTEOARTHRITIS  The various methods of treatment have been discussed with the patient and family. After consideration of risks, benefits and other options for treatment, the patient has consented to  Procedure(s): TOTAL HIP ARTHROPLASTY ANTERIOR APPROACH (Left) as a surgical intervention .  The patient's history has been reviewed, patient examined, no change in status, stable for surgery.  I have reviewed the patient's chart and labs.  Questions were answered to the patient's satisfaction.     [name]

## 2017-03-28 NOTE — Evaluation (Signed)
Physical Therapy Evaluation Patient Details Name: Eric [name] MRN: [id number] DOB: [birth year] Today's Date: 03/28/2017   History of Present Illness  72yo male s/p L anterior hip replacement done 03/28/17; PMH HTN, B shoulder replacements, LBP, obesity, GERD  Clinical Impression  Patient received in bed, pleasant and willing to participate with skilled PT services this afternoon. Educated provided on precautions and WBAT status anterior L hip, then proceeded with functional mobility with patient generally able to perform all mobility with min guard this session. Able to gait train approximately 108f in his room before he began to feel dizzy and lightheaded, became pale; PT immediately assisted patient back to sitting at EOB, RN paged and assisted in getting patient back into bed. Discussed exercise program with spouse, however recommend PT review this program tomorrow. Patient left in bed with RN/CNA attending, all needs otherwise met.      Follow Up Recommendations DC plan and follow up therapy as arranged by surgeon    Equipment Recommendations  Rolling walker with 5" wheels(patient appears to have all necessary DME otherwise )    Recommendations for Other Services       Precautions / Restrictions Precautions Precautions: Anterior Hip Precaution Booklet Issued: Yes (comment) Precaution Comments: no L LE extension, L LE ER, L LE ABD per MD order; sheet reviewed and given to patient and his wife  Restrictions Weight Bearing Restrictions: Yes LLE Weight Bearing: Weight bearing as tolerated      Mobility  Bed Mobility Overal bed mobility: Needs Assistance Bed Mobility: Supine to Sit;Sit to Supine     Supine to sit: Min guard Sit to supine: Mod assist   General bed mobility comments: min guard, VC for mobility out of bed; required Mod assist for return to bed due to possible hypoglycemic symptoms  Transfers Overall transfer level: Needs assistance Equipment used: Rolling  walker (2 wheeled) Transfers: Sit to/from Stand Sit to Stand: Min guard         General transfer comment: VC for form and safety   Ambulation/Gait Ambulation/Gait assistance: Min guard Ambulation Distance (Feet): 15 Feet Assistive device: Rolling walker (2 wheeled) Gait Pattern/deviations: Step-to pattern;Decreased step length - left;Decreased step length - right;Decreased stride length;Decreased weight shift to left;Trunk flexed     General Gait Details: gait distance limited by possible hypoglycemic symptoms- virtually walked around the bed and sat down on other side and RN paged due to symptoms   Stairs            Wheelchair Mobility    Modified Rankin (Stroke Patients Only)       Balance Overall balance assessment: No apparent balance deficits (not formally assessed)                                           Pertinent Vitals/Pain Pain Assessment: No/denies pain    Home Living Family/patient expects to be discharged to:: Private residence Living Arrangements: Spouse/significant other Available Help at Discharge: Family Type of Home: House Home Access: Stairs to enter   ETechnical brewerof Steps: 1 Home Layout: One level Home Equipment: Shower seat - built in;Grab bars - toilet;Grab bars - tub/shower;Hand held shower head      Prior Function Level of Independence: Independent         Comments: driving, very active, retired [employer] 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, commissioning  Hand: Right    Extremity/Trunk Assessment   Upper Extremity Assessment Upper Extremity Assessment: Overall WFL for tasks assessed    Lower Extremity Assessment Lower Extremity Assessment: Overall WFL for tasks assessed    Cervical / Trunk Assessment Cervical / Trunk Assessment: Normal  Communication   Communication: No difficulties  Cognition Arousal/Alertness: Awake/alert Behavior During Therapy: WFL for tasks assessed/performed Overall  Cognitive Status: Within Functional Limits for tasks assessed                                        General Comments      Exercises     Assessment/Plan    PT Assessment Patient needs continued PT services  PT Problem List Decreased strength;Decreased mobility;Decreased coordination;Decreased balance;Pain;Decreased knowledge of precautions       PT Treatment Interventions DME instruction;Therapeutic activities;Therapeutic exercise;Gait training;Patient/family education;Stair training;Balance training;Neuromuscular re-education;Functional mobility training    PT Goals (Current goals can be found in the Care Plan section)  Acute Rehab PT Goals Patient Stated Goal: to go home  PT Goal Formulation: With patient/family Time For Goal Achievement: 04/04/17 Potential to Achieve Goals: Good    Frequency 7X/week   Barriers to discharge        Co-evaluation               AM-PAC PT "6 Clicks" Daily Activity  Outcome Measure Difficulty turning over in bed (including adjusting bedclothes, sheets and blankets)?: Unable Difficulty moving from lying on back to sitting on the side of the bed? : Unable Difficulty sitting down on and standing up from a chair with arms (e.g., wheelchair, bedside commode, etc,.)?: Unable Help needed moving to and from a bed to chair (including a wheelchair)?: A Little Help needed walking in hospital room?: A Little Help needed climbing 3-5 steps with a railing? : A Little 6 Click Score: 12    End of Session Equipment Utilized During Treatment: Gait belt Activity Tolerance: Patient tolerated treatment well;Other (comment)(tolerated well but limited by possible hypoglycemic symptoms ) Patient left: in bed;with nursing/sitter in room;with call bell/phone within reach;with family/visitor present Nurse Communication: Mobility status;Other (comment)(possible hypoglycemic symptoms with gait ) PT Visit Diagnosis: Muscle weakness (generalized)  (M62.81);Difficulty in walking, not elsewhere classified (R26.2);Pain Pain - Right/Left: Left Pain - part of body: Hip    Time: 1510-1530 PT Time Calculation (min) (ACUTE ONLY): 20 min   Charges:   PT Evaluation $PT Eval Low Complexity: 1 Low     PT G Codes:   PT G-Codes **NOT FOR INPATIENT CLASS** Functional Assessment Tool Used: AM-PAC 6 Clicks Basic Mobility;Clinical judgement    Deniece Ree PT, DPT, CBIS  Supplemental Physical Therapist [place]   Pager [phone]

## 2017-03-28 NOTE — Care Management Note (Signed)
Case Management Note  Patient Details  Name: Eric [name] MRN: [id number] Date of Birth: 1946/01/15  Subjective/Objective:                    Action/Plan:  Spoke to patient and wife at bedside .   Orders for home health PT . Patient states originally he was going to have home health but "it changed to OP PT'. Patient has appointment for OP PT Wednesday at 1515. Patient has all DME at home already. 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  Expected Discharge Date:                  Expected Discharge Plan:  Home/Self Care  In-House Referral:     Discharge planning Services  CM Consult  Post Acute Care Choice:  Durable Medical Equipment, Home Health Choice offered to:  Patient, Spouse  DME Arranged:  N/A DME Agency:  NA  HH Arranged:  NA HH Agency:  NA  Status of Service:  Completed, signed off  If discussed at [place] of Stay Meetings, dates discussed:    Additional Comments:  Marilu Favre, RN 03/28/2017, 4:47 PM

## 2017-03-28 NOTE — Anesthesia Postprocedure Evaluation (Signed)
Anesthesia Post Note  Patient: Eric [name]  Procedure(s) Performed: TOTAL HIP ARTHROPLASTY ANTERIOR APPROACH (Left Hip)     Patient location during evaluation: PACU Anesthesia Type: MAC and Spinal Level of consciousness: awake and alert Pain management: pain level controlled Vital Signs Assessment: post-procedure vital signs reviewed and stable Respiratory status: spontaneous breathing, nonlabored ventilation, respiratory function stable and patient connected to nasal cannula oxygen Cardiovascular status: stable and blood pressure returned to baseline Postop Assessment: no apparent nausea or vomiting and spinal receding Anesthetic complications: no    Last Vitals:  Vitals:   03/28/17 1400 03/28/17 1420  BP: 120/74 117/62  Pulse: 64 (!) 55  Resp: 16 18  Temp: 36.6 C (!) 36.3 C  SpO2: 100% 100%    Last Pain:  Vitals:   03/28/17 1420  TempSrc: Oral  PainSc:                  James Senn

## 2017-03-29 ENCOUNTER — Encounter: Payer: Self-pay | Admitting: Family Medicine

## 2017-03-29 ENCOUNTER — Encounter (HOSPITAL_COMMUNITY): Payer: Self-pay | Admitting: Orthopedic Surgery

## 2017-03-29 LAB — BASIC METABOLIC PANEL
Anion gap: 7 (ref 5–15)
BUN: 12 mg/dL (ref 6–20)
CO2: 27 mmol/L (ref 22–32)
Calcium: 8.7 mg/dL — ABNORMAL LOW (ref 8.9–10.3)
Chloride: 103 mmol/L (ref 101–111)
Creatinine, Ser: 1.02 mg/dL (ref 0.61–1.24)
GFR calc Af Amer: >60 mL/min (ref >60)
GFR calc non Af Amer: >60 mL/min (ref >60)
Glucose, Bld: 108 mg/dL — ABNORMAL HIGH (ref 65–99)
Potassium: 4.4 mmol/L (ref 3.5–5.1)
Sodium: 137 mmol/L (ref 135–145)

## 2017-03-29 LAB — CBC
HCT: 42.6 % (ref 39.0–52.0)
Hemoglobin: 13.6 g/dL (ref 13.0–17.0)
MCH: 30.2 pg (ref 26.0–34.0)
MCHC: 31.9 g/dL (ref 30.0–36.0)
MCV: 94.7 fL (ref 78.0–100.0)
Platelets: 177 10*3/uL (ref 150–400)
RBC: 4.5 MIL/uL (ref 4.22–5.81)
RDW: 12.8 % (ref 11.5–15.5)
WBC: 10.9 10*3/uL — ABNORMAL HIGH (ref 4.0–10.5)

## 2017-03-29 NOTE — Discharge Summary (Signed)
Patient ID: Eric [name] MRN: [id number] DOB/AGE: Dec 16, 1945 72 y.o.  Admit date: 03/28/2017 Discharge date: 03/29/2017  Admission Diagnoses:  Principal Problem:   Osteoarthritis of left hip Active Problems:   Primary osteoarthritis of left hip   Discharge Diagnoses:  Same  Past Medical History:  Diagnosis Date  . Colon polyps   . Diverticulosis of colon   . Dysrhythmia    Palpations from caffiene  . Enlarged prostate   . GERD (gastroesophageal reflux disease)   . Hip osteoarthritis    Left  . History of hay fever   . Hx of cardiovascular stress test    Lexiscan Myoview (04/2013):  No ischemia, EF 61%; low risk  . Hyperlipemia   . Hypertension   . Internal hemorrhoids without mention of complication   . Osteoarthrosis, unspecified whether generalized or localized, unspecified site   . 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 Primary localized osteoarthrosis of right shoulder 05/25/2016  . Recent weight loss    75lbs  . Seasonal allergies   . Sleep apnea    CPAP  . Thyroid nodule   . Tremor of both hands     Surgeries: Procedure(s): TOTAL HIP ARTHROPLASTY ANTERIOR APPROACH on 03/28/2017   Consultants:   Discharged Condition: Improved  Hospital Course: Eric [name] is an 72 y.o. male who was admitted 03/28/2017 for operative treatment ofOsteoarthritis of left hip. Patient has severe unremitting pain that affects sleep, daily activities, and work/hobbies. After pre-op clearance the patient was taken to the operating room on 03/28/2017 and underwent  Procedure(s): TOTAL HIP ARTHROPLASTY ANTERIOR APPROACH.    Patient was given perioperative antibiotics:  Anti-infectives (From admission, onward)   Start     Dose/Rate Route Frequency Ordered Stop   03/28/17 1000  ceFAZolin (ANCEF) 3 g in dextrose 5 % 50 mL IVPB     3 g 130 mL/hr over 30 Minutes Intravenous To [facility] 03/25/17 0938 03/28/17 1145       Patient was given sequential  compression devices, early ambulation, and chemoprophylaxis to prevent DVT.  Patient benefited maximally from hospital stay and there were no complications.    Recent vital signs:  Patient Vitals for the past 24 hrs:  BP Temp Temp src Pulse Resp SpO2  03/29/17 1003 129/60 99.8 F (37.7 C) Oral 61 - 94 %  03/29/17 0444 (!) 113/58 98.4 F (36.9 C) - (!) 58 17 97 %  03/28/17 2234 122/64 98.8 F (37.1 C) Oral 67 18 97 %  03/28/17 2000 - - - - 18 -  03/28/17 1420 117/62 (!) 97.4 F (36.3 C) Oral (!) 55 18 100 %  03/28/17 1400 120/74 97.8 F (36.6 C) - 64 16 100 %  03/28/17 1345 114/68 - - (!) 56 19 100 %  03/28/17 1330 121/64 - - 61 12 100 %  03/28/17 1315 100/63 - - 61 12 100 %     Recent laboratory studies:  Recent Labs    03/29/17 0851  WBC 10.9*  HGB 13.6  HCT 42.6  PLT 177  NA 137  K 4.4  CL 103  CO2 27  BUN 12  CREATININE 1.02  GLUCOSE 108*  CALCIUM 8.7*     Discharge Medications:   Allergies as of 03/29/2017      Reactions   Lisinopril Cough      Medication List    STOP taking these medications   acetaminophen 500 MG tablet Commonly known as:  TYLENOL   HYDROcodone-acetaminophen 10-325 MG  tablet Commonly known as:  NORCO   meloxicam 15 MG tablet Commonly known as:  MOBIC     TAKE these medications   ALLERGY EYE OP Place 1-2 drops into both eyes 3 (three) times daily as needed (for dry/itching).   aspirin EC 325 MG tablet Take 1 tablet (325 mg total) by mouth 2 (two) times daily. What changed:    medication strength  how much to take  when to take this   atorvastatin 40 MG tablet Commonly known as:  LIPITOR TAKE 1 TABLET DAILY What changed:    how much to take  how to take this  when to take this   baclofen 10 MG tablet Commonly known as:  LIORESAL Take 1 tablet (10 mg total) by mouth 3 (three) times daily. As needed for muscle spasm What changed:    when to take this  reasons to take this  additional instructions    capsaicin 0.025 % cream Commonly known as:  ZOSTRIX Apply 1 application topically daily as needed (for hip pain).   CoQ10 200 MG Caps Take 200 mg by mouth at bedtime.   fluocinonide ointment 0.05 % Commonly known as:  LIDEX Apply 1 application topically daily as needed (for bites).   fluticasone 50 MCG/ACT nasal spray Commonly known as:  FLONASE Place 1 spray into both nostrils daily as needed for allergies or rhinitis.   loratadine 10 MG tablet Commonly known as:  CLARITIN Take 10 mg by mouth daily as needed for allergies.   multivitamin with minerals Tabs tablet Take 1 tablet by mouth daily.   mupirocin ointment 2 % Commonly known as:  BACTROBAN Apply 1 application topically 2 (two) times daily as needed (for skin tear/irritation.).   NONFORMULARY OR COMPOUNDED ITEM apap cpap maching--auto adjusting pressure  dx sleep apnea   ondansetron 4 MG tablet Commonly known as:  ZOFRAN Take 1 tablet (4 mg total) by mouth every 8 (eight) hours as needed for nausea or vomiting.   oxyCODONE-acetaminophen 5-325 MG tablet Commonly known as:  ROXICET Take 1 tablet by mouth every 4 (four) hours as needed.   pantoprazole 40 MG tablet Commonly known as:  PROTONIX TAKE 1 TABLET DAILY What changed:    how much to take  how to take this  when to take this   polyethylene glycol packet Commonly known as:  MIRALAX / GLYCOLAX Take 17 g by mouth daily.   sennosides-docusate sodium 8.6-50 MG tablet Commonly known as:  SENOKOT-S Take 2 tablets by mouth daily.   tamsulosin 0.4 MG Caps capsule Commonly known as:  FLOMAX TAKE 1 CAPSULE DAILY What changed:    how much to take  how to take this  when to take this            Durable Medical Equipment  (From admission, onward)        Start     Ordered   03/28/17 1437  DME Walker rolling  Once    Question:  Patient needs a walker to treat with the following condition  Answer:  Status post total hip replacement, left    03/28/17 1436   03/28/17 1437  DME 3 n 1  Once     03/28/17 1436   03/28/17 1437  DME Bedside commode  Once    Question:  Patient needs a bedside commode to treat with the following condition  Answer:  Status post total hip replacement, left   03/28/17 1436      Diagnostic Studies:  Dg Chest 2 View  Result Date: 03/16/2017 CLINICAL DATA:  Preoperative examination prior to anterior approach hip arthroplasty. EXAM: CHEST  2 VIEW COMPARISON:  No recent studies in [facility] FINDINGS: The lungs are well-expanded and clear. The heart and pulmonary vascularity are normal. The mediastinum is normal in width. The trachea is midline. There is no pleural effusion. There are prosthetic shoulder joints bilaterally. IMPRESSION: There is no active cardiopulmonary disease. Electronically Signed   By: David  Martinique M.D.   On: 03/16/2017 09:07   Dg C-arm 1-60 Min  Result Date: 03/28/2017 CLINICAL DATA:  Total left hip replacement EXAM: DG C-ARM 61-120 MIN; OPERATIVE LEFT HIP WITH PELVIS COMPARISON:  None. FINDINGS: Changes of left hip replacement. Normal AP alignment. No hardware bony complicating feature. IMPRESSION: Left hip replacement.  No visible complicating feature. Electronically Signed   By: Rolm Baptise M.D.   On: 03/28/2017 12:00   Dg Hip Operative Unilat W Or W/o Pelvis Left  Result Date: 03/28/2017 CLINICAL DATA:  Total left hip replacement EXAM: DG C-ARM 61-120 MIN; OPERATIVE LEFT HIP WITH PELVIS COMPARISON:  None. FINDINGS: Changes of left hip replacement. Normal AP alignment. No hardware bony complicating feature. IMPRESSION: Left hip replacement.  No visible complicating feature. Electronically Signed   By: Rolm Baptise M.D.   On: 03/28/2017 12:00    Disposition: 01-Home or Self Care  Discharge Instructions    Call MD / Call 911   Complete by:  As directed    If you experience chest pain or shortness of breath, CALL 911 and be transported to the hospital emergency room.  If you develope a fever  above 101 F, pus (white drainage) or increased drainage or redness at the wound, or calf pain, call your surgeon's office.   Constipation Prevention   Complete by:  As directed    Drink plenty of fluids.  Prune juice may be helpful.  You may use a stool softener, such as Colace (over the counter) 100 mg twice a day.  Use MiraLax (over the counter) for constipation as needed.   Diet - low sodium heart healthy   Complete by:  As directed    Driving restrictions   Complete by:  As directed    No driving for 2 weeks   Follow the hip precautions as taught in Physical Therapy   Complete by:  As directed    Increase activity slowly as tolerated   Complete by:  As directed    Patient may shower   Complete by:  As directed    You may shower without a dressing once there is no drainage.  Do not wash over the wound.  If drainage remains, cover wound with plastic wrap and then shower.      Follow-up Information    Frederik Pear, MD Follow up in 2 week(s).   Specialty:  Orthopedic Surgery Contact information: [place] [address] [phone]            Signed: Joanell Rising 03/29/2017, 1:04 PM

## 2017-03-29 NOTE — Progress Notes (Signed)
Physical Therapy Treatment Patient Details Name: Eric [name] MRN: [id number] DOB: [birth year] Today's Date: 03/29/2017    History of Present Illness 72yo male s/p L anterior hip replacement done 03/28/17; PMH HTN, B shoulder replacements, LBP, obesity, GERD    PT Comments    Patient is progressing well toward mobility goals. Pt ambulating in hallway with wife. Pt tolerated L LE exercises well and HEP handout and ant hip precautions reviewed with pt and wife. Pt able to recall 3/3 precautions without cues.  Current plan remains appropriate.   Follow Up Recommendations  DC plan and follow up therapy as arranged by surgeon     Equipment Recommendations  Rolling walker with 5" wheels(patient appears to have all necessary DME otherwise )    Recommendations for Other Services       Precautions / Restrictions Precautions Precautions: Anterior Hip Precaution Booklet Issued: Yes (comment) Precaution Comments: ant hip precautions reveiwed with pt and wife; pt able to recall 3/3 without cues Restrictions Weight Bearing Restrictions: Yes LLE Weight Bearing: Weight bearing as tolerated    Mobility  Bed Mobility               General bed mobility comments: NA; pt ambulating in hallway with wife   Transfers Overall transfer level: Needs assistance Equipment used: Rolling walker (2 wheeled) Transfers: Sit to/from Stand Sit to Stand: Supervision         General transfer comment: supervision for safety; safe hand placement demonstrated  Ambulation/Gait Ambulation/Gait assistance: Supervision Ambulation Distance (Feet): 100 Feet Assistive device: Rolling walker (2 wheeled) Gait Pattern/deviations: Decreased stride length;Decreased weight shift to left;Trunk flexed;Step-through pattern     General Gait Details: cues for posture, proximity to RW, and maintaining hip precautions when turning; pt with steady gait    Stairs         General stair comments: pt with single  step to enter home; sequencing and technique reviewed with pt and his wife  Wheelchair Mobility    Modified Rankin (Stroke Patients Only)       Balance Overall balance assessment: No apparent balance deficits (not formally assessed)                                          Cognition Arousal/Alertness: Awake/alert Behavior During Therapy: WFL for tasks assessed/performed Overall Cognitive Status: Within Functional Limits for tasks assessed                                        Exercises Total Joint Exercises Ankle Circles/Pumps: AROM;Both;10 reps Quad Sets: AROM;Both;10 reps Short Arc Quad: AROM;Left;10 reps Heel Slides: AROM;Left;10 reps Long Arc Quad: AROM;Left;10 reps Knee Flexion: AROM;Left;10 reps;Standing Marching in Standing: AROM;Left;10 reps;Standing    General Comments        Pertinent Vitals/Pain Pain Assessment: Faces Faces Pain Scale: Hurts little more Pain Location: L hip with therex Pain Descriptors / Indicators: Grimacing;Sore Pain Intervention(s): Limited activity within patient's tolerance;Monitored during session;Premedicated before session;Repositioned    Home Living                      Prior Function            PT Goals (current goals can now be found in the care plan section) Acute Rehab PT Goals Patient  Stated Goal: to go home  PT Goal Formulation: With patient/family Time For Goal Achievement: 04/04/17 Potential to Achieve Goals: Good Progress towards PT goals: Progressing toward goals    Frequency    7X/week      PT Plan Current plan remains appropriate    Co-evaluation              AM-PAC PT "6 Clicks" Daily Activity  Outcome Measure  Difficulty turning over in bed (including adjusting bedclothes, sheets and blankets)?: A Lot Difficulty moving from lying on back to sitting on the side of the bed? : A Lot Difficulty sitting down on and standing up from a chair with arms  (e.g., wheelchair, bedside commode, etc,.)?: A Lot Help needed moving to and from a bed to chair (including a wheelchair)?: A Little Help needed walking in hospital room?: A Little Help needed climbing 3-5 steps with a railing? : A Little 6 Click Score: 15    End of Session Equipment Utilized During Treatment: Gait belt Activity Tolerance: Patient tolerated treatment well Patient left: with call bell/phone within reach;with family/visitor present;in chair Nurse Communication: Mobility status PT Visit Diagnosis: Muscle weakness (generalized) (M62.81);Difficulty in walking, not elsewhere classified (R26.2);Pain Pain - Right/Left: Left Pain - part of body: Hip     Time: 1056-1130 PT Time Calculation (min) (ACUTE ONLY): 34 min  Charges:  $Gait Training: 8-22 mins $Therapeutic Exercise: 8-22 mins                    G Codes:       Earney Navy, PTA Pager: [phone]     Darliss Cheney 03/29/2017, 11:44 AM

## 2017-03-29 NOTE — Progress Notes (Signed)
PATIENT ID: Eric [name]  MRN: [id number]  DOB/AGE:  71-Aug-1947 / 71 y.o.  1 Day Post-Op Procedure(s) (LRB): TOTAL HIP ARTHROPLASTY ANTERIOR APPROACH (Left)    PROGRESS NOTE Subjective: Patient is alert, oriented, no Nausea, no Vomiting, yes passing gas, . Taking PO well. Denies SOB, Chest or Calf Pain. Using Incentive Spirometer, PAS in place. Ambulate Has been walking in room with no difficulty Patient reports pain as  3/10  .    Objective: Vital signs in last 24 hours: Vitals:   03/28/17 1420 03/28/17 2000 03/28/17 2234 03/29/17 0444  BP: 117/62  122/64 (!) 113/58  Pulse: (!) 55  67 (!) 58  Resp: 18 18 18 17   Temp: (!) 97.4 F (36.3 C)  98.8 F (37.1 C) 98.4 F (36.9 C)  TempSrc: Oral  Oral   SpO2: 100%  97% 97%  Weight:      Height:          Intake/Output from previous day: I/O last 3 completed shifts: In: 2740.8 [P.O.:120; I.V.:2545.8; Other:75] Out: 6387 [Urine:1250; Blood:300]   Intake/Output this shift: No intake/output data recorded.   LABORATORY DATA: Recent Labs    03/28/17 1545  GLUCAP 113*    Examination: Neurologically intact ABD soft Neurovascular intact Sensation intact distally Intact pulses distally Dorsiflexion/Plantar flexion intact Incision: no drainage No cellulitis present Compartment soft} XR AP&Lat of hip shows well placed\fixed THA  Assessment:   1 Day Post-Op Procedure(s) (LRB): TOTAL HIP ARTHROPLASTY ANTERIOR APPROACH (Left) ADDITIONAL DIAGNOSIS:  Expected Acute Blood Loss Anemia, Hypertension and Sleep Apnea, Obesity  Plan: PT/OT WBAT, THA  DVT Prophylaxis: SCDx72 hrs, ASA 325 mg BID x 2 weeks  DISCHARGE PLAN: HomeComment today.  If patient meets all physical therapy goals  DISCHARGE NEEDS: HHPT, Walker and 3-in-1 comode seat

## 2017-03-29 NOTE — Progress Notes (Signed)
Pt is anxious to go home. He was ambulating to the hall with walker, steady gait.

## 2017-03-30 ENCOUNTER — Telehealth: Payer: Self-pay

## 2017-03-30 DIAGNOSIS — Z7409 Other reduced mobility: Secondary | ICD-10-CM | POA: Diagnosis not present

## 2017-03-30 DIAGNOSIS — Z96642 Presence of left artificial hip joint: Secondary | ICD-10-CM | POA: Diagnosis not present

## 2017-03-30 NOTE — Telephone Encounter (Signed)
03/30/17   TCM Hospital Follow Up  Note: Wife states they do not feel patient needs Hospital Follow Up with provider because they will have follow up with surgeon. States they will keep appointment in March for Physical however.  Transition Care Management Follow-up Telephone Call  ADMISSION DATE: 02/01/14   DISCHARGE DATE: 02/02/14  Transition Care Management Follow-up Telephone Call  ADMISSION DATE: 03/28/17  DISCHARGE DATE: 03/29/17   How have you been since you were released from the hospital?  Per wife patient was able to walk with cane today.   Do you understand why you were in the hospital? Yes   Do you understand the discharge instrcutions? Yes    Items Reviewed:  Medications reviewed: Yes   Allergies reviewed: Yes, Lisinopril   Dietary changes reviewed: Low sodium heart healthy   Referrals reviewed: Appointment with Surgeon for follow up. Refused hospital follow up with provider per wife.   Functional Questionnaire:   Activities of Daily Living (ADLs): Per wife patient can as of today perform all independently.  Any patient concerns? None at this time per wife   Confirmed importance and date/time of follow-up visits scheduled: Refused to schedule follow up with provider.   Confirmed with patient if condition begins to worsen call PCP or go to the ER.  Yes    Patient was given the office number and encouragred to call back with questions or concerns. Yes

## 2017-04-05 DIAGNOSIS — Z7409 Other reduced mobility: Secondary | ICD-10-CM | POA: Diagnosis not present

## 2017-04-05 DIAGNOSIS — Z96642 Presence of left artificial hip joint: Secondary | ICD-10-CM | POA: Diagnosis not present

## 2017-04-07 DIAGNOSIS — Z96642 Presence of left artificial hip joint: Secondary | ICD-10-CM | POA: Diagnosis not present

## 2017-04-07 DIAGNOSIS — Z7409 Other reduced mobility: Secondary | ICD-10-CM | POA: Diagnosis not present

## 2017-04-12 DIAGNOSIS — Z471 Aftercare following joint replacement surgery: Secondary | ICD-10-CM | POA: Diagnosis not present

## 2017-04-12 DIAGNOSIS — Z96642 Presence of left artificial hip joint: Secondary | ICD-10-CM | POA: Diagnosis not present

## 2017-04-12 DIAGNOSIS — M1612 Unilateral primary osteoarthritis, left hip: Secondary | ICD-10-CM | POA: Diagnosis not present

## 2017-04-13 DIAGNOSIS — Z7409 Other reduced mobility: Secondary | ICD-10-CM | POA: Diagnosis not present

## 2017-04-13 DIAGNOSIS — Z96642 Presence of left artificial hip joint: Secondary | ICD-10-CM | POA: Diagnosis not present

## 2017-04-15 DIAGNOSIS — Z7409 Other reduced mobility: Secondary | ICD-10-CM | POA: Diagnosis not present

## 2017-04-15 DIAGNOSIS — Z96642 Presence of left artificial hip joint: Secondary | ICD-10-CM | POA: Diagnosis not present

## 2017-04-19 DIAGNOSIS — Z7409 Other reduced mobility: Secondary | ICD-10-CM | POA: Diagnosis not present

## 2017-04-19 DIAGNOSIS — Z96642 Presence of left artificial hip joint: Secondary | ICD-10-CM | POA: Diagnosis not present

## 2017-04-26 DIAGNOSIS — Z96642 Presence of left artificial hip joint: Secondary | ICD-10-CM | POA: Diagnosis not present

## 2017-04-26 DIAGNOSIS — Z7409 Other reduced mobility: Secondary | ICD-10-CM | POA: Diagnosis not present

## 2017-04-29 DIAGNOSIS — Z96642 Presence of left artificial hip joint: Secondary | ICD-10-CM | POA: Diagnosis not present

## 2017-04-29 DIAGNOSIS — Z7409 Other reduced mobility: Secondary | ICD-10-CM | POA: Diagnosis not present

## 2017-05-03 DIAGNOSIS — Z7409 Other reduced mobility: Secondary | ICD-10-CM | POA: Diagnosis not present

## 2017-05-03 DIAGNOSIS — Z96642 Presence of left artificial hip joint: Secondary | ICD-10-CM | POA: Diagnosis not present

## 2017-05-06 DIAGNOSIS — Z96642 Presence of left artificial hip joint: Secondary | ICD-10-CM | POA: Diagnosis not present

## 2017-05-06 DIAGNOSIS — Z7409 Other reduced mobility: Secondary | ICD-10-CM | POA: Diagnosis not present

## 2017-05-09 ENCOUNTER — Other Ambulatory Visit: Payer: Self-pay | Admitting: Family Medicine

## 2017-05-09 DIAGNOSIS — Z96642 Presence of left artificial hip joint: Secondary | ICD-10-CM | POA: Diagnosis not present

## 2017-05-09 DIAGNOSIS — Z7409 Other reduced mobility: Secondary | ICD-10-CM | POA: Diagnosis not present

## 2017-05-10 DIAGNOSIS — M1612 Unilateral primary osteoarthritis, left hip: Secondary | ICD-10-CM | POA: Diagnosis not present

## 2017-05-10 DIAGNOSIS — Z471 Aftercare following joint replacement surgery: Secondary | ICD-10-CM | POA: Diagnosis not present

## 2017-05-10 DIAGNOSIS — Z96642 Presence of left artificial hip joint: Secondary | ICD-10-CM | POA: Diagnosis not present

## 2017-05-12 DIAGNOSIS — E041 Nontoxic single thyroid nodule: Secondary | ICD-10-CM | POA: Diagnosis not present

## 2017-05-13 DIAGNOSIS — Z96642 Presence of left artificial hip joint: Secondary | ICD-10-CM | POA: Diagnosis not present

## 2017-05-13 DIAGNOSIS — Z7409 Other reduced mobility: Secondary | ICD-10-CM | POA: Diagnosis not present

## 2017-05-17 ENCOUNTER — Encounter: Payer: Self-pay | Admitting: Family Medicine

## 2017-05-17 ENCOUNTER — Ambulatory Visit (INDEPENDENT_AMBULATORY_CARE_PROVIDER_SITE_OTHER): Payer: Medicare Other | Admitting: Family Medicine

## 2017-05-17 VITALS — BP 120/70 | HR 74 | Temp 98.0°F | Resp 16 | Ht 68.0 in | Wt 265.8 lb

## 2017-05-17 DIAGNOSIS — E785 Hyperlipidemia, unspecified: Secondary | ICD-10-CM

## 2017-05-17 DIAGNOSIS — Z96642 Presence of left artificial hip joint: Secondary | ICD-10-CM | POA: Diagnosis not present

## 2017-05-17 DIAGNOSIS — Z7409 Other reduced mobility: Secondary | ICD-10-CM | POA: Diagnosis not present

## 2017-05-17 DIAGNOSIS — I10 Essential (primary) hypertension: Secondary | ICD-10-CM | POA: Diagnosis not present

## 2017-05-17 MED ORDER — VALSARTAN 40 MG PO TABS: 40.0000 mg | ORAL_TABLET | Freq: Every day | ORAL | 1 refills | Status: DC

## 2017-05-17 NOTE — Assessment & Plan Note (Signed)
Tolerating statin, encouraged heart healthy diet, avoid trans fats, minimize simple carbs and saturated fats. Increase exercise as tolerated 

## 2017-05-17 NOTE — Assessment & Plan Note (Signed)
Well controlled, no changes to meds. Encouraged heart healthy diet such as the DASH diet and exercise as tolerated.  °

## 2017-05-17 NOTE — Progress Notes (Signed)
Patient ID: Eric [name], male   DOB: 07-22-1945, 72 y.o.   MRN: [id number]    Subjective:  I acted as a Education administrator for Dr. Carollee Herter.  Eric [name], Eric [name]   Patient ID: Eric [name], male    DOB: [birth year], 72 y.o.   MRN: [id number]  Chief Complaint  Patient presents with  . Hypertension  . Hyperlipidemia    HPI  Patient is in today for follow up cholesterol and blood pressure.  He started taking his bp meds again because he gained weight  Patient Care Team: Carollee Herter, Alferd Apa, DO as PCP - General Barbaraann Barthel Sharyn Lull, MD as Consulting Physician (Sports Medicine) Marchia Bond, MD as Consulting Physician (Orthopedic Surgery)   Past Medical History:  Diagnosis Date  . Colon polyps   . Diverticulosis of colon   . Dysrhythmia    Palpations from caffiene  . Enlarged prostate   . GERD (gastroesophageal reflux disease)   . Hip osteoarthritis    Left  . History of hay fever   . Hx of cardiovascular stress test    Lexiscan Myoview (04/2013):  No ischemia, EF 61%; low risk  . Hyperlipemia   . Hypertension   . Internal hemorrhoids without mention of complication   . Osteoarthrosis, unspecified whether generalized or localized, unspecified site   . 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 Primary localized osteoarthrosis of right shoulder 05/25/2016  . Recent weight loss    75lbs  . Seasonal allergies   . Sleep apnea    CPAP  . Thyroid nodule   . Tremor of both hands     Past Surgical History:  Procedure Laterality Date  . COLONOSCOPY    . SHOULDER SURGERY  07-14-07   right  . TONSILLECTOMY    . TOTAL HIP ARTHROPLASTY Left 03/28/2017  . TOTAL HIP ARTHROPLASTY Left 03/28/2017   Procedure: TOTAL HIP ARTHROPLASTY ANTERIOR APPROACH;  Surgeon: Frederik Pear, MD;  Location: [facility];  Service: Orthopedics;  Laterality: Left;  . TOTAL SHOULDER ARTHROPLASTY Left 10/14/2015   Procedure: LEFT TOTAL SHOULDER ARTHROPLASTY;  Surgeon: Marchia Bond, MD;  Location: [place];  Service:  Orthopedics;  Laterality: Left;  . TOTAL SHOULDER ARTHROPLASTY Right 05/25/2016   Procedure: TOTAL SHOULDER ARTHROPLASTY;  Surgeon: Marchia Bond, MD;  Location: [place];  Service: Orthopedics;  Laterality: Right;    Family History  Problem Relation Age of Onset  . Arthritis Mother   . Cancer Mother 74       breast  . Hyperlipidemia Mother   . Colon polyps Mother 71  . Heart disease Father        CAD--passed away 2011-07-14 age 60  . Hyperlipidemia Father   . Hypertension Father   . Cancer Sister 36       breast  . Cancer Brother 52       prostate  . Breast cancer Unknown   . Prostate cancer Unknown   . Colon cancer Neg Hx     Social History   Socioeconomic History  . Marital status: Married    Spouse name: Not on file  . Number of children: 3  . Years of education: Not on file  . Highest education level: Not on file  Social Needs  . Financial resource strain: Not on file  . Food insecurity - worry: Not on file  . Food insecurity - inability: Not on file  . Transportation needs - medical: Not on file  . Transportation needs - non-medical: Not on file  Occupational History  . Occupation: Retired--[employer]  Tobacco Use  . Smoking status: Never Smoker  . Smokeless tobacco: Never Used  Substance and Sexual Activity  . Alcohol use: Yes    Alcohol/week: 0.0 oz    Comment: rare----12 a year  . Drug use: No  . Sexual activity: Yes    Partners: Female  Other Topics Concern  . Not on file  Social History Narrative   Lives with wife and daughter and two grandchildren.     Outpatient Medications Prior to Visit  Medication Sig Dispense Refill  . atorvastatin (LIPITOR) 40 MG tablet TAKE 1 TABLET DAILY (Patient taking differently: Take 40 mg by mouth daily) 90 tablet 1  . capsaicin (ZOSTRIX) 0.025 % cream Apply 1 application topically daily as needed (for hip pain).    . Coenzyme Q10 (COQ10) 200 MG CAPS Take 200 mg by mouth at bedtime.    . cyclobenzaprine  (FLEXERIL) 10 MG tablet     . fluocinonide ointment (LIDEX) 6.31 % Apply 1 application topically daily as needed (for bites).    . fluticasone (FLONASE) 50 MCG/ACT nasal spray Place 1 spray into both nostrils daily as needed for allergies or rhinitis.    [name] loratadine (CLARITIN) 10 MG tablet Take 10 mg by mouth daily as needed for allergies.     . meloxicam (MOBIC) 15 MG tablet TAKE 1 TABLET DAILY 90 tablet 0  . Multiple Vitamin (MULTIVITAMIN WITH MINERALS) TABS tablet Take 1 tablet by mouth daily.    . mupirocin ointment (BACTROBAN) 2 % Apply 1 application topically 2 (two) times daily as needed (for skin tear/irritation.).     Mable Fill (ALLERGY EYE OP) Place 1-2 drops into both eyes 3 (three) times daily as needed (for dry/itching).    . NONFORMULARY OR COMPOUNDED ITEM apap cpap maching--auto adjusting pressure  dx sleep apnea 1 each 0  . ondansetron (ZOFRAN) 4 MG tablet Take 1 tablet (4 mg total) by mouth every 8 (eight) hours as needed for nausea or vomiting. 30 tablet 0  . oxyCODONE-acetaminophen (ROXICET) 5-325 MG tablet Take 1 tablet by mouth every 4 (four) hours as needed. 30 tablet 0  . pantoprazole (PROTONIX) 40 MG tablet TAKE 1 TABLET DAILY (Patient taking differently: Take 40 mg by mouth daily) 90 tablet 3  . polyethylene glycol (MIRALAX / GLYCOLAX) packet Take 17 g by mouth daily.    . sennosides-docusate sodium (SENOKOT-S) 8.6-50 MG tablet Take 2 tablets by mouth daily. 30 tablet 1  . tamsulosin (FLOMAX) 0.4 MG CAPS capsule TAKE 1 CAPSULE DAILY (Patient taking differently: Take 0.4 mg by mouth daily) 90 capsule 3  . aspirin EC 325 MG tablet Take 1 tablet (325 mg total) by mouth 2 (two) times daily. 30 tablet 0  . baclofen (LIORESAL) 10 MG tablet Take 1 tablet (10 mg total) by mouth 3 (three) times daily. As needed for muscle spasm (Patient taking differently: Take 10 mg by mouth 3 (three) times daily as needed for muscle spasms. ) 50 tablet 0  . valsartan (DIOVAN) 40 MG  tablet Take 1 tablet by mouth daily.     No facility-administered medications prior to visit.     Allergies  Allergen Reactions  . Lisinopril Cough    Review of Systems  Constitutional: Negative for fever and malaise/fatigue.  HENT: Negative for congestion.   Eyes: Negative for blurred vision.  Respiratory: Negative for cough and shortness of breath.   Cardiovascular: Negative for chest pain, palpitations and leg swelling.  Gastrointestinal: Negative for vomiting.  Musculoskeletal: Negative for back pain.  Skin: Negative for rash.  Neurological: Negative for loss of consciousness and headaches.       Objective:    Physical Exam  Constitutional: He is oriented to person, place, and time. Vital signs are normal. He appears well-developed and well-nourished. He is sleeping.  HENT:  Head: Normocephalic and atraumatic.  Mouth/Throat: Oropharynx is clear and moist.  Eyes: EOM are normal. Pupils are equal, round, and reactive to light.  Neck: Normal range of motion. Neck supple. No thyromegaly present.  Cardiovascular: Normal rate and regular rhythm.  No murmur heard. Pulmonary/Chest: Effort normal and breath sounds normal. No respiratory distress. He has no wheezes. He has no rales. He exhibits no tenderness.  Musculoskeletal: He exhibits edema. He exhibits no tenderness.  Tr pitting edema R LE +1 pitting edema LLE  Neurological: He is alert and oriented to person, place, and time.  Skin: Skin is warm and dry.  Psychiatric: He has a normal mood and affect. His behavior is normal. Judgment and thought content normal.  Nursing note and vitals reviewed.   BP 120/70 (BP Location: Left Arm, Cuff Size: Large)   Pulse 74   Temp 98 F (36.7 C) (Oral)   Resp 16   Ht 5\' 8"  (1.727 m)   Wt 265 lb 12.8 oz (120.6 kg)   SpO2 97%   BMI 40.41 kg/m  Wt Readings from Last 3 Encounters:  05/17/17 265 lb 12.8 oz (120.6 kg)  03/28/17 264 lb (119.7 kg)  03/16/17 264 lb (119.7 kg)   BP  Readings from Last 3 Encounters:  05/17/17 120/70  03/29/17 129/60  03/16/17 (!) 156/77     Immunization History  Administered Date(s) Administered  . Influenza Split 12/24/2011  . Influenza Whole 01/01/2009  . Influenza, High Dose Seasonal PF 11/26/2013, 01/01/2015, 11/18/2016  . Influenza,inj,Quad PF,6+ Mos 01/15/2013  . Influenza-Unspecified 12/15/2015  . Pneumococcal Conjugate-13 02/11/2014  . Pneumococcal Polysaccharide-23 12/24/2011  . Td 05/01/2008  . Tdap 01/01/2015  . Zoster 10/08/2010  . Zoster Recombinat (Shingrix) 11/24/2016, 03/10/2017    Health Maintenance  Topic Date Due  . COLONOSCOPY  03/19/2018  . TETANUS/TDAP  12/31/2024  . INFLUENZA VACCINE  Completed  . Hepatitis C Screening  Completed  . PNA vac Low Risk Adult  Completed    Lab Results  Component Value Date   WBC 10.9 (H) 03/29/2017   HGB 13.6 03/29/2017   HCT 42.6 03/29/2017   PLT 177 03/29/2017   GLUCOSE 108 (H) 03/29/2017   CHOL 133 03/08/2017   TRIG 56.0 03/08/2017   HDL 58.90 03/08/2017   LDLCALC 62 03/08/2017   ALT 19 03/08/2017   AST 15 03/08/2017   NA 137 03/29/2017   K 4.4 03/29/2017   CL 103 03/29/2017   CREATININE 1.02 03/29/2017   BUN 12 03/29/2017   CO2 27 03/29/2017   TSH 0.62 10/03/2015   PSA 0.9 04/23/2016   INR 0.96 03/16/2017   HGBA1C 5.6 03/08/2017   MICROALBUR 1.5 12/25/2012    Lab Results  Component Value Date   TSH 0.62 10/03/2015   Lab Results  Component Value Date   WBC 10.9 (H) 03/29/2017   HGB 13.6 03/29/2017   HCT 42.6 03/29/2017   MCV 94.7 03/29/2017   PLT 177 03/29/2017   Lab Results  Component Value Date   NA 137 03/29/2017   K 4.4 03/29/2017   CO2 27 03/29/2017   GLUCOSE 108 (H) 03/29/2017   BUN  12 03/29/2017   CREATININE 1.02 03/29/2017   BILITOT 0.8 03/08/2017   ALKPHOS 56 03/08/2017   AST 15 03/08/2017   ALT 19 03/08/2017   PROT 6.8 03/08/2017   ALBUMIN 4.3 03/08/2017   CALCIUM 8.7 (L) 03/29/2017   ANIONGAP 7 03/29/2017   GFR  81.12 03/08/2017   Lab Results  Component Value Date   CHOL 133 03/08/2017   Lab Results  Component Value Date   HDL 58.90 03/08/2017   Lab Results  Component Value Date   LDLCALC 62 03/08/2017   Lab Results  Component Value Date   TRIG 56.0 03/08/2017   Lab Results  Component Value Date   CHOLHDL 2 03/08/2017   Lab Results  Component Value Date   HGBA1C 5.6 03/08/2017         Assessment & Plan:   Problem List Items Addressed This Visit      Unprioritized   Essential hypertension - Primary    Well controlled, no changes to meds. Encouraged heart healthy diet such as the DASH diet and exercise as tolerated.       Relevant Medications   valsartan (DIOVAN) 40 MG tablet   Hyperlipidemia    Tolerating statin, encouraged heart healthy diet, avoid trans fats, minimize simple carbs and saturated fats. Increase exercise as tolerated      Relevant Medications   valsartan (DIOVAN) 40 MG tablet      I have discontinued [facility] "Mike"'s baclofen and aspirin EC. I have also changed his valsartan. Additionally, I am having him maintain his loratadine, fluticasone, NONFORMULARY OR COMPOUNDED ITEM, multivitamin with minerals, CoQ10, mupirocin ointment, Naphazoline-Pheniramine (ALLERGY EYE OP), ondansetron, sennosides-docusate sodium, tamsulosin, pantoprazole, atorvastatin, capsaicin, fluocinonide ointment, polyethylene glycol, oxyCODONE-acetaminophen, meloxicam, and cyclobenzaprine.  Meds ordered this encounter  Medications  . valsartan (DIOVAN) 40 MG tablet    Sig: Take 1 tablet (40 mg total) by mouth daily.    Dispense:  90 tablet    Refill:  1    CMA served as Education administrator during this visit. History, Physical and Plan performed by medical provider. Documentation and orders reviewed and attested to.  [name], DO

## 2017-05-17 NOTE — Patient Instructions (Signed)

## 2017-05-20 DIAGNOSIS — Z471 Aftercare following joint replacement surgery: Secondary | ICD-10-CM | POA: Diagnosis not present

## 2017-05-20 DIAGNOSIS — Z7409 Other reduced mobility: Secondary | ICD-10-CM | POA: Diagnosis not present

## 2017-05-20 DIAGNOSIS — Z96642 Presence of left artificial hip joint: Secondary | ICD-10-CM | POA: Diagnosis not present

## 2017-05-24 ENCOUNTER — Other Ambulatory Visit: Payer: Self-pay | Admitting: Family Medicine

## 2017-05-24 ENCOUNTER — Encounter: Payer: Self-pay | Admitting: Family Medicine

## 2017-05-24 DIAGNOSIS — Z471 Aftercare following joint replacement surgery: Secondary | ICD-10-CM | POA: Diagnosis not present

## 2017-05-24 DIAGNOSIS — Z7409 Other reduced mobility: Secondary | ICD-10-CM | POA: Diagnosis not present

## 2017-05-24 DIAGNOSIS — I10 Essential (primary) hypertension: Secondary | ICD-10-CM

## 2017-05-24 DIAGNOSIS — Z96642 Presence of left artificial hip joint: Secondary | ICD-10-CM | POA: Diagnosis not present

## 2017-05-24 MED ORDER — VALSARTAN 80 MG PO TABS: 80.0000 mg | ORAL_TABLET | Freq: Every day | ORAL | 3 refills | Status: DC

## 2017-05-27 DIAGNOSIS — Z96642 Presence of left artificial hip joint: Secondary | ICD-10-CM | POA: Diagnosis not present

## 2017-05-27 DIAGNOSIS — Z7409 Other reduced mobility: Secondary | ICD-10-CM | POA: Diagnosis not present

## 2017-05-27 DIAGNOSIS — Z471 Aftercare following joint replacement surgery: Secondary | ICD-10-CM | POA: Diagnosis not present

## 2017-05-29 ENCOUNTER — Other Ambulatory Visit: Payer: Self-pay | Admitting: Family Medicine

## 2017-05-30 DIAGNOSIS — M19012 Primary osteoarthritis, left shoulder: Secondary | ICD-10-CM | POA: Diagnosis not present

## 2017-05-30 DIAGNOSIS — M19011 Primary osteoarthritis, right shoulder: Secondary | ICD-10-CM | POA: Diagnosis not present

## 2017-05-31 DIAGNOSIS — Z96642 Presence of left artificial hip joint: Secondary | ICD-10-CM | POA: Diagnosis not present

## 2017-05-31 DIAGNOSIS — Z7409 Other reduced mobility: Secondary | ICD-10-CM | POA: Diagnosis not present

## 2017-06-03 DIAGNOSIS — Z7409 Other reduced mobility: Secondary | ICD-10-CM | POA: Diagnosis not present

## 2017-06-03 DIAGNOSIS — Z96642 Presence of left artificial hip joint: Secondary | ICD-10-CM | POA: Diagnosis not present

## 2017-06-03 DIAGNOSIS — Z471 Aftercare following joint replacement surgery: Secondary | ICD-10-CM | POA: Diagnosis not present

## 2017-06-06 DIAGNOSIS — Z471 Aftercare following joint replacement surgery: Secondary | ICD-10-CM | POA: Diagnosis not present

## 2017-06-06 DIAGNOSIS — Z7409 Other reduced mobility: Secondary | ICD-10-CM | POA: Diagnosis not present

## 2017-06-06 DIAGNOSIS — Z96642 Presence of left artificial hip joint: Secondary | ICD-10-CM | POA: Diagnosis not present

## 2017-06-10 DIAGNOSIS — Z7409 Other reduced mobility: Secondary | ICD-10-CM | POA: Diagnosis not present

## 2017-06-10 DIAGNOSIS — Z471 Aftercare following joint replacement surgery: Secondary | ICD-10-CM | POA: Diagnosis not present

## 2017-06-10 DIAGNOSIS — Z96642 Presence of left artificial hip joint: Secondary | ICD-10-CM | POA: Diagnosis not present

## 2017-08-04 ENCOUNTER — Other Ambulatory Visit: Payer: Self-pay | Admitting: Family Medicine

## 2017-08-04 DIAGNOSIS — N400 Enlarged prostate without lower urinary tract symptoms: Secondary | ICD-10-CM

## 2017-08-08 ENCOUNTER — Other Ambulatory Visit: Payer: Self-pay | Admitting: Family Medicine

## 2017-08-19 DIAGNOSIS — H524 Presbyopia: Secondary | ICD-10-CM | POA: Diagnosis not present

## 2017-08-19 DIAGNOSIS — H2513 Age-related nuclear cataract, bilateral: Secondary | ICD-10-CM | POA: Diagnosis not present

## 2017-08-19 DIAGNOSIS — H5203 Hypermetropia, bilateral: Secondary | ICD-10-CM | POA: Diagnosis not present

## 2017-09-08 ENCOUNTER — Encounter: Payer: Self-pay | Admitting: Family Medicine

## 2017-09-08 ENCOUNTER — Ambulatory Visit (INDEPENDENT_AMBULATORY_CARE_PROVIDER_SITE_OTHER): Payer: Medicare Other | Admitting: Family Medicine

## 2017-09-08 VITALS — BP 138/68 | HR 74 | Temp 98.6°F | Resp 16 | Ht 68.0 in | Wt 276.6 lb

## 2017-09-08 DIAGNOSIS — N401 Enlarged prostate with lower urinary tract symptoms: Secondary | ICD-10-CM | POA: Diagnosis not present

## 2017-09-08 DIAGNOSIS — E785 Hyperlipidemia, unspecified: Secondary | ICD-10-CM | POA: Diagnosis not present

## 2017-09-08 DIAGNOSIS — R350 Frequency of micturition: Secondary | ICD-10-CM

## 2017-09-08 DIAGNOSIS — I10 Essential (primary) hypertension: Secondary | ICD-10-CM

## 2017-09-08 LAB — PSA: PSA: 1.12 ng/mL (ref 0.10–4.00)

## 2017-09-08 LAB — COMPREHENSIVE METABOLIC PANEL
ALT: 15 U/L (ref 0–53)
AST: 15 U/L (ref 0–37)
Albumin: 4.5 g/dL (ref 3.5–5.2)
Alkaline Phosphatase: 59 U/L (ref 39–117)
BUN: 17 mg/dL (ref 6–23)
CO2: 28 mEq/L (ref 19–32)
Calcium: 9.4 mg/dL (ref 8.4–10.5)
Chloride: 103 mEq/L (ref 96–112)
Creatinine, Ser: 1.02 mg/dL (ref 0.40–1.50)
GFR: 76.44 mL/min (ref >60.00)
Glucose, Bld: 90 mg/dL (ref 70–99)
Potassium: 4.5 mEq/L (ref 3.5–5.1)
Sodium: 140 mEq/L (ref 135–145)
Total Bilirubin: 0.9 mg/dL (ref 0.2–1.2)
Total Protein: 6.8 g/dL (ref 6.0–8.3)

## 2017-09-08 LAB — LIPID PANEL
Cholesterol: 151 mg/dL (ref 0–200)
HDL: 47.9 mg/dL (ref >39.00)
LDL Cholesterol: 74 mg/dL (ref 0–99)
NonHDL: 102.73
Total CHOL/HDL Ratio: 3
Triglycerides: 142 mg/dL (ref 0.0–149.0)
VLDL: 28.4 mg/dL (ref 0.0–40.0)

## 2017-09-08 NOTE — Patient Instructions (Signed)
DASH Eating Plan DASH stands for "Dietary Approaches to Stop Hypertension." The DASH eating plan is a healthy eating plan that has been shown to reduce high blood pressure (hypertension). It may also reduce your risk for type 2 diabetes, heart disease, and stroke. The DASH eating plan may also help with weight loss. What are tips for following this plan? General guidelines  Avoid eating more than 2,300 mg (milligrams) of salt (sodium) a day. If you have hypertension, you may need to reduce your sodium intake to 1,500 mg a day.  Limit alcohol intake to no more than 1 drink a day for nonpregnant women and 2 drinks a day for men. One drink equals 12 oz of beer, 5 oz of wine, or 1 oz of hard liquor.  Work with your health care provider to maintain a healthy body weight or to lose weight. Ask what an ideal weight is for you.  Get at least 30 minutes of exercise that causes your heart to beat faster (aerobic exercise) most days of the week. Activities may include walking, swimming, or biking.  Work with your health care provider or diet and nutrition specialist (dietitian) to adjust your eating plan to your individual calorie needs. Reading food labels  Check food labels for the amount of sodium per serving. Choose foods with less than 5 percent of the Daily Value of sodium. Generally, foods with less than 300 mg of sodium per serving fit into this eating plan.  To find whole grains, look for the word "whole" as the first word in the ingredient list. Shopping  Buy products labeled as "low-sodium" or "no salt added."  Buy fresh foods. Avoid canned foods and premade or frozen meals. Cooking  Avoid adding salt when cooking. Use salt-free seasonings or herbs instead of table salt or sea salt. Check with your health care provider or pharmacist before using salt substitutes.  Do not fry foods. Cook foods using healthy methods such as baking, boiling, grilling, and broiling instead.  Cook with  heart-healthy oils, such as olive, canola, soybean, or sunflower oil. Meal planning   Eat a balanced diet that includes: ? 5 or more servings of fruits and vegetables each day. At each meal, try to fill half of your plate with fruits and vegetables. ? Up to 6-8 servings of whole grains each day. ? Less than 6 oz of lean meat, poultry, or fish each day. A 3-oz serving of meat is about the same size as a deck of cards. One egg equals 1 oz. ? 2 servings of low-fat dairy each day. ? A serving of nuts, seeds, or beans 5 times each week. ? Heart-healthy fats. Healthy fats called Omega-3 fatty acids are found in foods such as flaxseeds and coldwater fish, like sardines, salmon, and mackerel.  Limit how much you eat of the following: ? Canned or prepackaged foods. ? Food that is high in trans fat, such as fried foods. ? Food that is high in saturated fat, such as fatty meat. ? Sweets, desserts, sugary drinks, and other foods with added sugar. ? Full-fat dairy products.  Do not salt foods before eating.  Try to eat at least 2 vegetarian meals each week.  Eat more home-cooked food and less restaurant, buffet, and fast food.  When eating at a restaurant, ask that your food be prepared with less salt or no salt, if possible. What foods are recommended? The items listed may not be a complete list. Talk with your dietitian about what   dietary choices are best for you. Grains Whole-grain or whole-wheat bread. Whole-grain or whole-wheat pasta. Brown rice. Oatmeal. Quinoa. Bulgur. Whole-grain and low-sodium cereals. Pita bread. Low-fat, low-sodium crackers. Whole-wheat flour tortillas. Vegetables Fresh or frozen vegetables (raw, steamed, roasted, or grilled). Low-sodium or reduced-sodium tomato and vegetable juice. Low-sodium or reduced-sodium tomato sauce and tomato paste. Low-sodium or reduced-sodium canned vegetables. Fruits All fresh, dried, or frozen fruit. Canned fruit in natural juice (without  added sugar). Meat and other protein foods Skinless chicken or turkey. Ground chicken or turkey. Pork with fat trimmed off. Fish and seafood. Egg whites. Dried beans, peas, or lentils. Unsalted nuts, nut butters, and seeds. Unsalted canned beans. Lean cuts of beef with fat trimmed off. Low-sodium, lean deli meat. Dairy Low-fat (1%) or fat-free (skim) milk. Fat-free, low-fat, or reduced-fat cheeses. Nonfat, low-sodium ricotta or cottage cheese. Low-fat or nonfat yogurt. Low-fat, low-sodium cheese. Fats and oils Soft margarine without trans fats. Vegetable oil. Low-fat, reduced-fat, or light mayonnaise and salad dressings (reduced-sodium). Canola, safflower, olive, soybean, and sunflower oils. Avocado. Seasoning and other foods Herbs. Spices. Seasoning mixes without salt. Unsalted popcorn and pretzels. Fat-free sweets. What foods are not recommended? The items listed may not be a complete list. Talk with your dietitian about what dietary choices are best for you. Grains Baked goods made with fat, such as croissants, muffins, or some breads. Dry pasta or rice meal packs. Vegetables Creamed or fried vegetables. Vegetables in a cheese sauce. Regular canned vegetables (not low-sodium or reduced-sodium). Regular canned tomato sauce and paste (not low-sodium or reduced-sodium). Regular tomato and vegetable juice (not low-sodium or reduced-sodium). Pickles. Olives. Fruits Canned fruit in a light or heavy syrup. Fried fruit. Fruit in cream or butter sauce. Meat and other protein foods Fatty cuts of meat. Ribs. Fried meat. Bacon. Sausage. Bologna and other processed lunch meats. Salami. Fatback. Hotdogs. Bratwurst. Salted nuts and seeds. Canned beans with added salt. Canned or smoked fish. Whole eggs or egg yolks. Chicken or turkey with skin. Dairy Whole or 2% milk, cream, and half-and-half. Whole or full-fat cream cheese. Whole-fat or sweetened yogurt. Full-fat cheese. Nondairy creamers. Whipped toppings.  Processed cheese and cheese spreads. Fats and oils Butter. Stick margarine. Lard. Shortening. Ghee. Bacon fat. Tropical oils, such as coconut, palm kernel, or palm oil. Seasoning and other foods Salted popcorn and pretzels. Onion salt, garlic salt, seasoned salt, table salt, and sea salt. Worcestershire sauce. Tartar sauce. Barbecue sauce. Teriyaki sauce. Soy sauce, including reduced-sodium. Steak sauce. Canned and packaged gravies. Fish sauce. Oyster sauce. Cocktail sauce. Horseradish that you find on the shelf. Ketchup. Mustard. Meat flavorings and tenderizers. Bouillon cubes. Hot sauce and Tabasco sauce. Premade or packaged marinades. Premade or packaged taco seasonings. Relishes. Regular salad dressings. Where to find more information:  National Heart, Lung, and Blood Institute: www.nhlbi.nih.gov  American Heart Association: www.heart.org Summary  The DASH eating plan is a healthy eating plan that has been shown to reduce high blood pressure (hypertension). It may also reduce your risk for type 2 diabetes, heart disease, and stroke.  With the DASH eating plan, you should limit salt (sodium) intake to 2,300 mg a day. If you have hypertension, you may need to reduce your sodium intake to 1,500 mg a day.  When on the DASH eating plan, aim to eat more fresh fruits and vegetables, whole grains, lean proteins, low-fat dairy, and heart-healthy fats.  Work with your health care provider or diet and nutrition specialist (dietitian) to adjust your eating plan to your individual   calorie needs. This information is not intended to replace advice given to you by your health care provider. Make sure you discuss any questions you have with your health care provider. Document Released: 02/25/2011 Document Revised: 03/01/2016 Document Reviewed: 03/01/2016 Elsevier Interactive Patient Education  2018 [employer]  

## 2017-09-08 NOTE — Assessment & Plan Note (Signed)
Tolerating statin, encouraged heart healthy diet, avoid trans fats, minimize simple carbs and saturated fats. Increase exercise as tolerated 

## 2017-09-08 NOTE — Assessment & Plan Note (Signed)
Well controlled, no changes to meds. Encouraged heart healthy diet such as the DASH diet and exercise as tolerated.  °

## 2017-09-08 NOTE — Progress Notes (Signed)
Patient ID: Eric [name], male   DOB: 13-Mar-1946, 72 y.o.   MRN: [id number]     Subjective:  I acted as a Education administrator for Dr. Carollee Herter.  Eric [name], Eric [name]   Patient ID: Eric [name], male    DOB: 03-05-1946, 72 y.o.   MRN: [id number]  Chief Complaint  Patient presents with  . Hypertension  . Hyperlipidemia    HPI  Patient is in today for follow up blood pressure and cholesterol.  No complaints.   Patient Care Team: Carollee Herter, Alferd Apa, DO as PCP - General Barbaraann Barthel Sharyn Lull, MD as Consulting Physician (Sports Medicine) Marchia Bond, MD as Consulting Physician (Orthopedic Surgery)   Past Medical History:  Diagnosis Date  . Colon polyps   . Diverticulosis of colon   . Dysrhythmia    Palpations from caffiene  . Enlarged prostate   . GERD (gastroesophageal reflux disease)   . Hip osteoarthritis    Left  . History of hay fever   . Hx of cardiovascular stress test    Lexiscan Myoview (04/2013):  No ischemia, EF 61%; low risk  . Hyperlipemia   . Hypertension   . Internal hemorrhoids without mention of complication   . Osteoarthrosis, unspecified whether generalized or localized, unspecified site   . 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 Primary localized osteoarthrosis of right shoulder 05/25/2016  . Recent weight loss    75lbs  . Seasonal allergies   . Sleep apnea    CPAP  . Thyroid nodule   . Tremor of both hands     Past Surgical History:  Procedure Laterality Date  . COLONOSCOPY    . SHOULDER SURGERY  July 13, 2007   right  . TONSILLECTOMY    . TOTAL HIP ARTHROPLASTY Left 03/28/2017  . TOTAL HIP ARTHROPLASTY Left 03/28/2017   Procedure: TOTAL HIP ARTHROPLASTY ANTERIOR APPROACH;  Surgeon: Frederik Pear, MD;  Location: [place];  Service: Orthopedics;  Laterality: Left;  . TOTAL SHOULDER ARTHROPLASTY Left 10/14/2015   Procedure: LEFT TOTAL SHOULDER ARTHROPLASTY;  Surgeon: Marchia Bond, MD;  Location: [place] [facility];  Service: Orthopedics;  Laterality: Left;  . TOTAL  SHOULDER ARTHROPLASTY Right 05/25/2016   Procedure: TOTAL SHOULDER ARTHROPLASTY;  Surgeon: Marchia Bond, MD;  Location: [place];  Service: Orthopedics;  Laterality: Right;    Family History  Problem Relation Age of Onset  . Arthritis Mother   . Cancer Mother 109       breast  . Hyperlipidemia Mother   . Colon polyps Mother 71  . Heart disease Father        CAD--passed away 2011-07-13 age 32  . Hyperlipidemia Father   . Hypertension Father   . Cancer Sister 68       breast  . Cancer Brother 40       prostate  . Breast cancer Unknown   . Prostate cancer Unknown   . Colon cancer Neg Hx     Social History   Socioeconomic History  . Marital status: Married    Spouse name: Not on file  . Number of children: 3  . Years of education: Not on file  . Highest education level: Not on file  Occupational History  . Occupation: Retired--[employer]  Social Needs  . Financial resource strain: Not on file  . Food insecurity:    Worry: Not on file    Inability: Not on file  . Transportation needs:    Medical: Not on file    Non-medical: Not  on file  Tobacco Use  . Smoking status: Never Smoker  . Smokeless tobacco: Never Used  Substance and Sexual Activity  . Alcohol use: Yes    Alcohol/week: 0.0 oz    Comment: rare----12 a year  . Drug use: No  . Sexual activity: Yes    Partners: Female  Lifestyle  . Physical activity:    Days per week: Not on file    Minutes per session: Not on file  . Stress: Not on file  Relationships  . Social connections:    Talks on phone: Not on file    Gets together: Not on file    Attends religious service: Not on file    Active member of club or organization: Not on file    Attends meetings of clubs or organizations: Not on file    Relationship status: Not on file  . Intimate partner violence:    Fear of current or ex partner: Not on file    Emotionally abused: Not on file    Physically abused: Not on file    Forced sexual activity: Not  on file  Other Topics Concern  . Not on file  Social History Narrative   Lives with wife and daughter and two grandchildren.     Outpatient Medications Prior to Visit  Medication Sig Dispense Refill  . atorvastatin (LIPITOR) 40 MG tablet TAKE 1 TABLET DAILY 90 tablet 1  . capsaicin (ZOSTRIX) 0.025 % cream Apply 1 application topically daily as needed (for hip pain).    . Coenzyme Q10 (COQ10) 200 MG CAPS Take 200 mg by mouth at bedtime.    . fluocinonide ointment (LIDEX) 8.82 % Apply 1 application topically daily as needed (for bites).    . fluticasone (FLONASE) 50 MCG/ACT nasal spray Place 1 spray into both nostrils daily as needed for allergies or rhinitis.    [name] loratadine (CLARITIN) 10 MG tablet Take 10 mg by mouth daily as needed for allergies.     . meloxicam (MOBIC) 15 MG tablet TAKE 1 TABLET DAILY 90 tablet 0  . Multiple Vitamin (MULTIVITAMIN WITH MINERALS) TABS tablet Take 1 tablet by mouth daily.    . mupirocin ointment (BACTROBAN) 2 % Apply 1 application topically 2 (two) times daily as needed (for skin tear/irritation.).     Mable Fill (ALLERGY EYE OP) Place 1-2 drops into both eyes 3 (three) times daily as needed (for dry/itching).    . NONFORMULARY OR COMPOUNDED ITEM apap cpap maching--auto adjusting pressure  dx sleep apnea 1 each 0  . ondansetron (ZOFRAN) 4 MG tablet Take 1 tablet (4 mg total) by mouth every 8 (eight) hours as needed for nausea or vomiting. 30 tablet 0  . oxyCODONE-acetaminophen (ROXICET) 5-325 MG tablet Take 1 tablet by mouth every 4 (four) hours as needed. 30 tablet 0  . pantoprazole (PROTONIX) 40 MG tablet TAKE 1 TABLET DAILY (Patient taking differently: Take 40 mg by mouth daily) 90 tablet 3  . polyethylene glycol (MIRALAX / GLYCOLAX) packet Take 17 g by mouth daily.    . sennosides-docusate sodium (SENOKOT-S) 8.6-50 MG tablet Take 2 tablets by mouth daily. 30 tablet 1  . tamsulosin (FLOMAX) 0.4 MG CAPS capsule Take 0.4 mg by mouth daily 90  capsule 3  . valsartan (DIOVAN) 80 MG tablet Take 1 tablet (80 mg total) by mouth daily. 90 tablet 3  . cyclobenzaprine (FLEXERIL) 10 MG tablet      No facility-administered medications prior to visit.     Allergies  Allergen Reactions  . Lisinopril Cough    Review of Systems  Constitutional: Negative for fever and malaise/fatigue.  HENT: Negative for congestion.   Eyes: Negative for blurred vision.  Respiratory: Negative for cough and shortness of breath.   Cardiovascular: Negative for chest pain, palpitations and leg swelling.  Gastrointestinal: Negative for vomiting.  Musculoskeletal: Negative for back pain.  Skin: Negative for rash.  Neurological: Negative for loss of consciousness and headaches.       Objective:    Physical Exam  Constitutional: He is oriented to person, place, and time. Vital signs are normal. He appears well-developed and well-nourished. He is sleeping.  HENT:  Head: Normocephalic and atraumatic.  Mouth/Throat: Oropharynx is clear and moist.  Eyes: Pupils are equal, round, and reactive to light. EOM are normal.  Neck: Normal range of motion. Neck supple. No thyromegaly present.  Cardiovascular: Normal rate and regular rhythm.  No murmur heard. Pulmonary/Chest: Effort normal and breath sounds normal. No respiratory distress. He has no wheezes. He has no rales. He exhibits no tenderness.  Musculoskeletal: He exhibits no edema or tenderness.  Neurological: He is alert and oriented to person, place, and time.  Skin: Skin is warm and dry.  Psychiatric: He has a normal mood and affect. His behavior is normal. Judgment and thought content normal.  Nursing note and vitals reviewed.   BP 138/68 (BP Location: Right Arm, Cuff Size: Large)   Pulse 74   Temp 98.6 F (37 C) (Oral)   Resp 16   Ht 5\' 8"  (1.727 m)   Wt 276 lb 9.6 oz (125.5 kg)   SpO2 97%   BMI 42.06 kg/m  Wt Readings from Last 3 Encounters:  09/08/17 276 lb 9.6 oz (125.5 kg)  05/17/17  265 lb 12.8 oz (120.6 kg)  03/28/17 264 lb (119.7 kg)   BP Readings from Last 3 Encounters:  09/08/17 138/68  05/17/17 120/70  03/29/17 129/60     Immunization History  Administered Date(s) Administered  . Influenza Split 12/24/2011  . Influenza Whole 01/01/2009  . Influenza, High Dose Seasonal PF 11/26/2013, 01/01/2015, 11/18/2016  . Influenza,inj,Quad PF,6+ Mos 01/15/2013  . Influenza-Unspecified 12/15/2015  . Pneumococcal Conjugate-13 02/11/2014  . Pneumococcal Polysaccharide-23 12/24/2011  . Td 05/01/2008  . Tdap 01/01/2015  . Zoster 10/08/2010  . Zoster Recombinat (Shingrix) 11/24/2016, 03/10/2017    Health Maintenance  Topic Date Due  . INFLUENZA VACCINE  10/20/2017  . COLONOSCOPY  03/19/2018  . TETANUS/TDAP  12/31/2024  . Hepatitis C Screening  Completed  . PNA vac Low Risk Adult  Completed    Lab Results  Component Value Date   WBC 10.9 (H) 03/29/2017   HGB 13.6 03/29/2017   HCT 42.6 03/29/2017   PLT 177 03/29/2017   GLUCOSE 90 09/08/2017   CHOL 151 09/08/2017   TRIG 142.0 09/08/2017   HDL 47.90 09/08/2017   LDLCALC 74 09/08/2017   ALT 15 09/08/2017   AST 15 09/08/2017   NA 140 09/08/2017   K 4.5 09/08/2017   CL 103 09/08/2017   CREATININE 1.02 09/08/2017   BUN 17 09/08/2017   CO2 28 09/08/2017   TSH 0.62 10/03/2015   PSA 1.12 09/08/2017   INR 0.96 03/16/2017   HGBA1C 5.6 03/08/2017   MICROALBUR 1.5 12/25/2012    Lab Results  Component Value Date   TSH 0.62 10/03/2015   Lab Results  Component Value Date   WBC 10.9 (H) 03/29/2017   HGB 13.6 03/29/2017   HCT 42.6 03/29/2017  MCV 94.7 03/29/2017   PLT 177 03/29/2017   Lab Results  Component Value Date   NA 140 09/08/2017   K 4.5 09/08/2017   CO2 28 09/08/2017   GLUCOSE 90 09/08/2017   BUN 17 09/08/2017   CREATININE 1.02 09/08/2017   BILITOT 0.9 09/08/2017   ALKPHOS 59 09/08/2017   AST 15 09/08/2017   ALT 15 09/08/2017   PROT 6.8 09/08/2017   ALBUMIN 4.5 09/08/2017   CALCIUM 9.4  09/08/2017   ANIONGAP 7 03/29/2017   GFR 76.44 09/08/2017   Lab Results  Component Value Date   CHOL 151 09/08/2017   Lab Results  Component Value Date   HDL 47.90 09/08/2017   Lab Results  Component Value Date   LDLCALC 74 09/08/2017   Lab Results  Component Value Date   TRIG 142.0 09/08/2017   Lab Results  Component Value Date   CHOLHDL 3 09/08/2017   Lab Results  Component Value Date   HGBA1C 5.6 03/08/2017         Assessment & Plan:   Problem List Items Addressed This Visit      Unprioritized   Essential hypertension    Well controlled, no changes to meds. Encouraged heart healthy diet such as the DASH diet and exercise as tolerated.       Relevant Orders   Comprehensive metabolic panel (Completed)   Lipid panel (Completed)   Hyperlipidemia LDL goal <100 - Primary    Tolerating statin, encouraged heart healthy diet, avoid trans fats, minimize simple carbs and saturated fats. Increase exercise as tolerated      Relevant Orders   Comprehensive metabolic panel (Completed)   Lipid panel (Completed)    Other Visit Diagnoses    Benign prostatic hyperplasia with urinary frequency       Relevant Orders   PSA (Completed)      I have discontinued Eric L. [place] "Mike"'s cyclobenzaprine. I am also having him maintain his loratadine, fluticasone, NONFORMULARY OR COMPOUNDED ITEM, multivitamin with minerals, CoQ10, mupirocin ointment, Naphazoline-Pheniramine (ALLERGY EYE OP), ondansetron, sennosides-docusate sodium, pantoprazole, capsaicin, fluocinonide ointment, polyethylene glycol, oxyCODONE-acetaminophen, valsartan, atorvastatin, tamsulosin, and meloxicam.  No orders of the defined types were placed in this encounter.   CMA served as Education administrator during this visit. History, Physical and Plan performed by medical provider. Documentation and orders reviewed and attested to.  [name], DO

## 2017-09-27 ENCOUNTER — Other Ambulatory Visit: Payer: Self-pay | Admitting: Family Medicine

## 2017-09-27 DIAGNOSIS — K219 Gastro-esophageal reflux disease without esophagitis: Secondary | ICD-10-CM

## 2017-10-20 ENCOUNTER — Other Ambulatory Visit: Payer: Self-pay

## 2017-11-03 DIAGNOSIS — M1612 Unilateral primary osteoarthritis, left hip: Secondary | ICD-10-CM | POA: Diagnosis not present

## 2017-11-06 ENCOUNTER — Other Ambulatory Visit: Payer: Self-pay | Admitting: Family Medicine

## 2017-11-23 IMAGING — CT CT SHOULDER*R* W/O CM
3 series · 8 of 14 positions shown, 9 images · non-contrast
Comparison: None.

CLINICAL DATA: Bilateral shoulder pain with decreased range of
motion for several years. Remote injury falling from a [place] in
9223. History of AC joint separation on the right in 5663.

EXAM:
CT OF THE RIGHT SHOULDER WITHOUT CONTRAST
TECHNIQUE: Multidetector CT imaging was performed according to the standard
protocol. Multiplanar CT image reconstructions were also generated.

[Series 5: shoulder soft · axial · 0.53mm/px · z∈[-158,-90]mm · 2 of 82 slices shown, 3 images]
[im 28/82  soft-tissue]
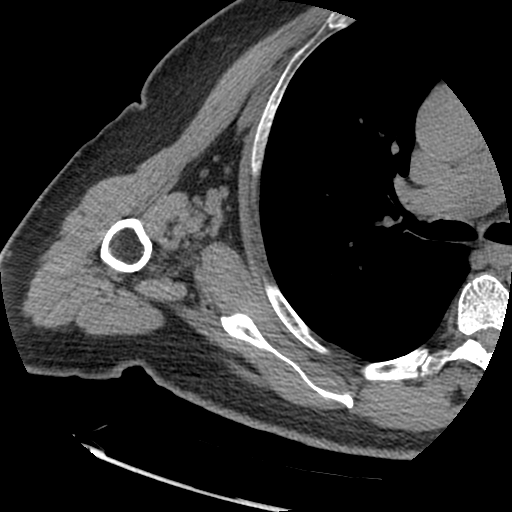
[im 28/82  bone]
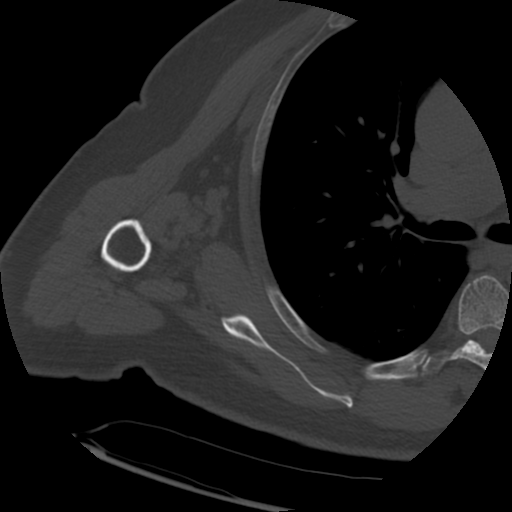
[im 55/82  bone]
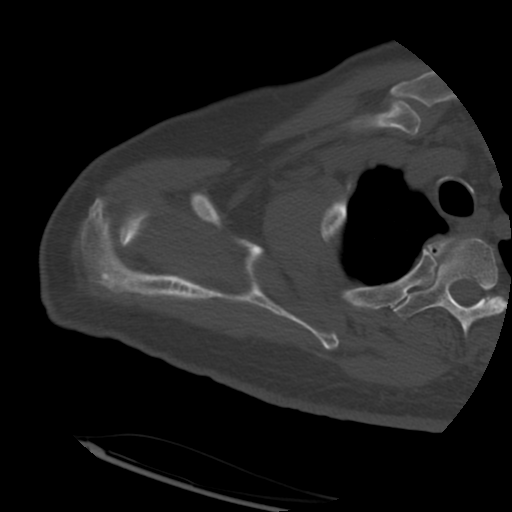

[Series 300: sag soft · sagittal · 0.53mm/px · 3 of 114 slices shown]
[im 29/114  soft-tissue]
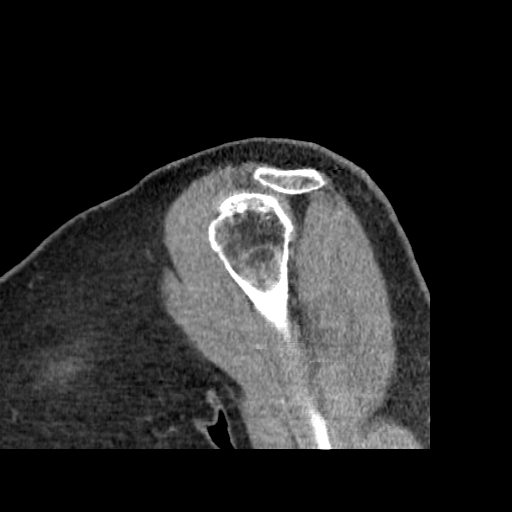
[im 57/114  soft-tissue]
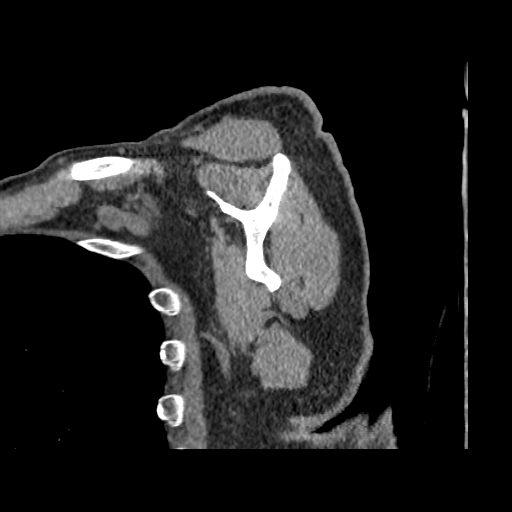
[im 85/114  soft-tissue]
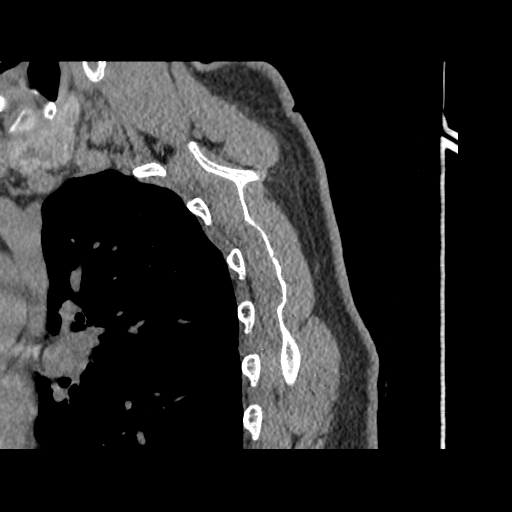

[Series 301: cor soft · oblique · 0.53mm/px · 3 of 100 slices shown]
[im 25/100  soft-tissue]
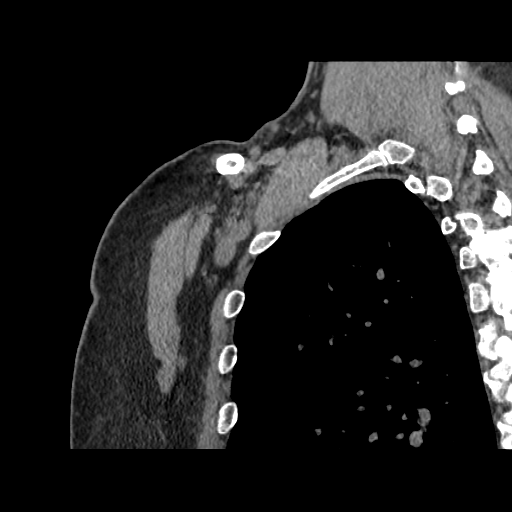
[im 50/100  soft-tissue]
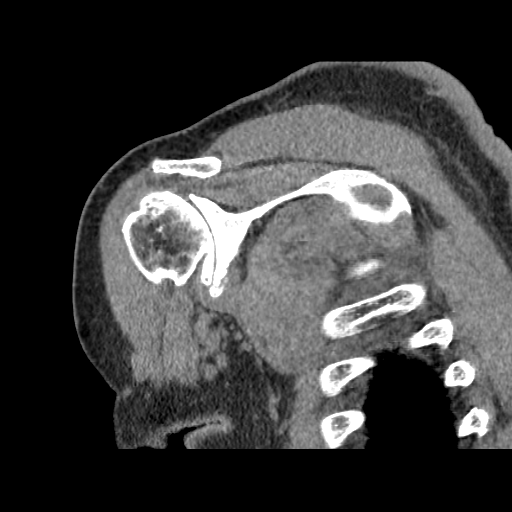
[im 75/100  soft-tissue]
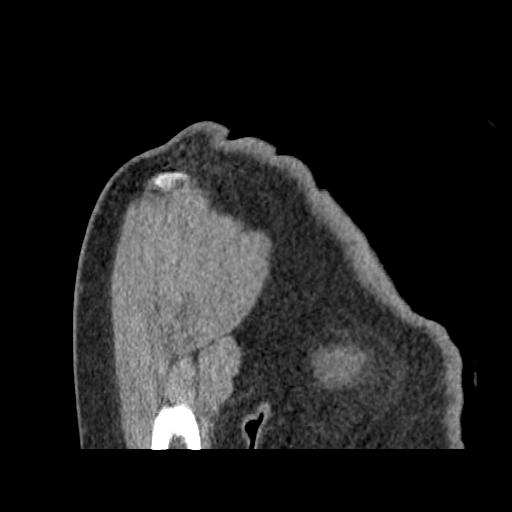

[8 of 14 positions shown; findings below may reference images not displayed]

FINDINGS: Bones: There is no evidence of acute fracture or dislocation. There
is flattening of the articular surface of the humeral head, probably
related to long-standing arthropathy. No definite findings of
avascular necrosis.

Joint/cartilage: There is severe glenohumeral arthropathy with joint
space loss, osteophytes and subchondral cyst formation. There is a
focal osteophyte projecting superiorly from the humeral head,
contributing to marked narrowing of the subacromial space and
probable rotator cuff impingement. There is a small shoulder joint
effusion. Several loose bodies are present both anteriorly and
posteriorly. The acromion is type 1. There are mild
acromioclavicular degenerative changes.

Ligaments: Not applicable for exam/indication.

Tendons/muscles: There is mild generalized muscular atrophy
surrounding the shoulder. No focal rotator cuff muscular atrophy or
gross full-thickness tendon tear identified. As above, there is
significant narrowing of the subacromial space which is exacerbated
by superiorly projecting osteophytes from the humeral head. These
likely contribute to rotator cuff impingement.

Neurovascular/other soft tissues: No focal inflammatory changes are
seen surrounding the shoulder. 1.5 cm calcified left thyroid nodule
and 2.0 cm noncalcified right thyroid nodule are noted. There is
coronary artery atherosclerosis.
IMPRESSION: 1. Severe glenohumeral arthropathy with small joint effusion and
multiple loose bodies. Given the advanced fairly symmetric nature of
these findings in both shoulders, CPPD arthropathy is favored.
2. Significant narrowing of the subacromial space likely
contributing to rotator cuff impingement. No focal rotator cuff
muscular atrophy identified.
3. Bilateral thyroid nodules. Correlation with thyroid ultrasound
recommended.
4. Coronary artery atherosclerosis.

## 2017-12-01 ENCOUNTER — Encounter (INDEPENDENT_AMBULATORY_CARE_PROVIDER_SITE_OTHER): Payer: Self-pay

## 2017-12-14 ENCOUNTER — Ambulatory Visit (INDEPENDENT_AMBULATORY_CARE_PROVIDER_SITE_OTHER): Payer: Medicare Other | Admitting: Family Medicine

## 2017-12-14 ENCOUNTER — Encounter (INDEPENDENT_AMBULATORY_CARE_PROVIDER_SITE_OTHER): Payer: Self-pay | Admitting: Family Medicine

## 2017-12-14 VITALS — BP 147/81 | HR 47 | Temp 97.8°F | Ht 68.0 in | Wt 288.0 lb

## 2017-12-14 DIAGNOSIS — Z0289 Encounter for other administrative examinations: Secondary | ICD-10-CM

## 2017-12-14 DIAGNOSIS — Z1331 Encounter for screening for depression: Secondary | ICD-10-CM

## 2017-12-14 DIAGNOSIS — R5383 Other fatigue: Secondary | ICD-10-CM | POA: Diagnosis not present

## 2017-12-14 DIAGNOSIS — R739 Hyperglycemia, unspecified: Secondary | ICD-10-CM | POA: Diagnosis not present

## 2017-12-14 DIAGNOSIS — E7849 Other hyperlipidemia: Secondary | ICD-10-CM | POA: Diagnosis not present

## 2017-12-14 DIAGNOSIS — Z6841 Body Mass Index (BMI) 40.0 and over, adult: Secondary | ICD-10-CM

## 2017-12-14 DIAGNOSIS — E559 Vitamin D deficiency, unspecified: Secondary | ICD-10-CM | POA: Diagnosis not present

## 2017-12-14 DIAGNOSIS — R0602 Shortness of breath: Secondary | ICD-10-CM

## 2017-12-14 DIAGNOSIS — I10 Essential (primary) hypertension: Secondary | ICD-10-CM

## 2017-12-14 NOTE — Progress Notes (Signed)
Office: [phone]  /  Fax: [phone]   Dear Dr. Carollee [name],   Thank you for referring Eric [name] to our clinic. The following note includes my evaluation and treatment recommendations.  HPI:   Chief Complaint: OBESITY    Eric [name] has been referred by Eric Held, DO for consultation regarding his obesity and obesity related comorbidities.    [name] is a 72 y.o. male who presents on 12/14/2017 for obesity evaluation and treatment. Current BMI is Body mass index is 43.79 kg/m.Eric [name] has been struggling with his weight for many years and has been unsuccessful in either losing weight, maintaining weight loss, or reaching his healthy weight goal. Eric [name] needs to lose weight, in order to have hip replacement surgery.     Eric [name] attended our information session and states he is currently in the action stage of change and ready to dedicate time achieving and maintaining a healthier weight. Eric [name] is interested in becoming our patient and working on intensive lifestyle modifications including (but not limited to) diet, exercise and weight loss.    Eric [name] states his family eats meals together he thinks his family will eat healthier with  him he struggles with family and or coworkers weight loss sabotage his desired weight loss is 105 lbs he has been heavy most of  his life he started gaining weight at age 73 his heaviest weight ever was 285 lbs. he has significant food cravings issues  he is frequently drinking liquids with calories he frequently eats larger portions than normal  he has binge eating behaviors he struggles with emotional eating    Fatigue Eric [name] feels his energy is lower than it should be. This has worsened with weight gain and has not worsened recently. Eric [name] admits to daytime somnolence and denies waking up still tired. Patient is at risk for obstructive sleep apnea. Patent has a history of symptoms of  daytime fatigue and hypertension. Patient generally gets 6 or 7 hours of sleep per night, and states they generally have restful sleep. Snoring is present. Apneic episodes are present. Epworth Sleepiness Score is 13  Dyspnea on exertion Eric [name] notes increasing shortness of breath with exercising and seems to be worsening over time with weight gain. He notes getting out of breath sooner with activity than he used to. This has not gotten worse recently. Eric [name] denies orthopnea.  Hypertension Eric [name] is a 72 y.o. male with hypertension. His blood pressure is elevated today at 147/81 and he is currently on medications.  Eric [name] denies chest pain. He is working weight loss to help control his blood pressure with the goal of decreasing his risk of heart attack and stroke. Eric [name] blood pressure is not currently controlled.  Hyperlipidemia Eric [name] has hyperlipidemia and is on statin currently. He is attempting to improve his cholesterol levels with intensive lifestyle modification including a low saturated fat diet, exercise and weight loss. He denies any chest pain or myalgias.  Vitamin D deficiency Eric [name] has a diagnosis of vitamin D deficiency. Eric [name] is not currently taking vit D and he admits to fatigue, but he denies nausea, vomiting or muscle weakness.  Hyperglycemia Eric [name] has a history of elevated fasting blood sugars in Epic. He admits to polyphagia.  Depression Screen Eric [name]'s Food and Mood (modified PHQ-9) score was  Depression screen PHQ 2/9 12/14/2017  Decreased Interest 3  Down, Depressed, Hopeless 3  PHQ - 2 Score 6  Altered sleeping 3  Tired, decreased energy  3  Change in appetite 3  Feeling bad or failure about yourself  3  Trouble concentrating 0  Moving slowly or fidgety/restless 1  Suicidal thoughts 0  PHQ-9 Score 19  Difficult doing work/chores Somewhat difficult    ALLERGIES: Allergies  Allergen Reactions  . Lisinopril Cough     MEDICATIONS: Current Outpatient Medications on File Prior to Visit  Medication Sig Dispense Refill  . aspirin EC 81 MG tablet Take 81 mg by mouth daily.    [name] atorvastatin (LIPITOR) 40 MG tablet TAKE 1 TABLET DAILY 90 tablet 1  . Coenzyme Q10 (COQ10) 200 MG CAPS Take 200 mg by mouth at bedtime.    [name] loratadine (CLARITIN) 10 MG tablet Take 10 mg by mouth daily as needed for allergies.     . meloxicam (MOBIC) 15 MG tablet TAKE 1 TABLET DAILY 90 tablet 4  . Multiple Vitamin (MULTIVITAMIN WITH MINERALS) TABS tablet Take 1 tablet by mouth daily.    . NONFORMULARY OR COMPOUNDED ITEM apap cpap maching--auto adjusting pressure  dx sleep apnea 1 each 0  . pantoprazole (PROTONIX) 40 MG tablet TAKE 1 TABLET DAILY 90 tablet 3  . tamsulosin (FLOMAX) 0.4 MG CAPS capsule Take 0.4 mg by mouth daily 90 capsule 3  . valsartan (DIOVAN) 80 MG tablet Take 1 tablet (80 mg total) by mouth daily. 90 tablet 3   No current facility-administered medications on file prior to visit.     PAST MEDICAL HISTORY: Past Medical History:  Diagnosis Date  . Alcohol abuse   . Back pain   . Colon polyps   . Diverticulosis of colon   . DJD (degenerative joint disease)   . Dysrhythmia    Palpations from caffiene  . Enlarged prostate   . GERD (gastroesophageal reflux disease)   . Hearing loss   . Hip osteoarthritis    Left  . History of hay fever   . Hx of cardiovascular stress test    Lexiscan Myoview (04/2013):  No ischemia, EF 61%; low risk  . Hyperlipemia   . Hypertension   . Internal hemorrhoids without mention of complication   . Leg edema   . Neuroma of foot    left  . Obesity   . Osteoarthrosis, unspecified whether generalized or localized, unspecified site   . Palpitations   . 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 Primary localized osteoarthrosis of right shoulder 05/25/2016  . Recent weight loss    75lbs  . Seasonal allergies   . Sleep apnea    CPAP  . Thyroid nodule   . Tremor  of both hands     PAST SURGICAL HISTORY: Past Surgical History:  Procedure Laterality Date  . COLONOSCOPY    . SHOULDER SURGERY  2009   right  . TONSILLECTOMY    . TOTAL HIP ARTHROPLASTY Left 03/28/2017  . TOTAL HIP ARTHROPLASTY Left 03/28/2017   Procedure: TOTAL HIP ARTHROPLASTY ANTERIOR APPROACH;  Surgeon: Frederik Pear, MD;  Location: [facility];  Service: Orthopedics;  Laterality: Left;  . TOTAL SHOULDER ARTHROPLASTY Left 10/14/2015   Procedure: LEFT TOTAL SHOULDER ARTHROPLASTY;  Surgeon: Marchia Bond, MD;  Location: [facility];  Service: Orthopedics;  Laterality: Left;  . TOTAL SHOULDER ARTHROPLASTY Right 05/25/2016   Procedure: TOTAL SHOULDER ARTHROPLASTY;  Surgeon: Marchia Bond, MD;  Location: [facility];  Service: Orthopedics;  Laterality: Right;    SOCIAL HISTORY: Social History   Tobacco Use  . Smoking status: Never Smoker  . Smokeless tobacco: Never Used  Substance  Use Topics  . Alcohol use: Yes    Alcohol/week: 0.0 standard drinks    Comment: rare----12 a year  . Drug use: No    FAMILY HISTORY: Family History  Problem Relation Age of Onset  . Arthritis Mother   . Cancer Mother 71       breast  . Hyperlipidemia Mother   . Colon polyps Mother 70  . Hypertension Mother   . Heart disease Mother   . Depression Mother   . Anxiety disorder Mother   . Obesity Mother   . Heart disease Father        CAD--passed away 07/11/11 age 17  . Hyperlipidemia Father   . Hypertension Father   . Obesity Father   . Diabetes Father   . Depression Father   . Sleep apnea Father   . Cancer Sister 58       breast  . Cancer Brother 37       prostate  . Breast cancer Unknown   . Prostate cancer Unknown   . Colon cancer Neg Hx     ROS: Review of Systems  Constitutional: Positive for malaise/fatigue.  HENT: Positive for hearing loss and tinnitus.        + Hay Fever  Eyes:       + Wear Glasses or Contacts  Respiratory: Positive for shortness of breath and wheezing.   Cardiovascular:  Negative for chest pain and orthopnea.       + Shortness of Breath with Activity + Leg Cramping  Gastrointestinal: Positive for heartburn. Negative for nausea and vomiting.  Genitourinary: Positive for frequency.  Musculoskeletal: Positive for back pain. Negative for myalgias.       + Muscle or Joint Pain Negative for muscle weakness  Skin: Positive for itching and rash.       + Dryness   Neurological: Positive for tremors and weakness.  Endo/Heme/Allergies: Bruises/bleeds easily.       Positive for polyphagia  Psychiatric/Behavioral: Positive for depression.    PHYSICAL EXAM: Blood pressure (!) 147/81, pulse (!) 47, temperature 97.8 F (36.6 C), temperature source Oral, height 5\' 8"  (1.727 m), weight 288 lb (130.6 kg), SpO2 98 %. Body mass index is 43.79 kg/m. Physical Exam  Constitutional: He is oriented to person, place, and time. He appears well-developed and well-nourished.  HENT:  Head: Normocephalic and atraumatic.  Nose: Nose normal.  Eyes: EOM are normal. No scleral icterus.  Neck: Normal range of motion. Neck supple. No thyromegaly present.  Cardiovascular: Regular rhythm. Bradycardia present.  Pulmonary/Chest: Effort normal. No respiratory distress.  Abdominal: Soft. There is no tenderness.  + Obesity  Musculoskeletal: Normal range of motion.  Range of Motion normal in all 4 extremities  Neurological: He is alert and oriented to person, place, and time. Coordination normal.  Skin: Skin is warm and dry.  + brown discoloration left anterior lower leg  Psychiatric: He has a normal mood and affect. His behavior is normal.  Vitals reviewed.   RECENT LABS AND TESTS: BMET    Component Value Date/Time   NA 140 09/08/2017 1040   K 4.5 09/08/2017 1040   CL 103 09/08/2017 1040   CO2 28 09/08/2017 1040   GLUCOSE 90 09/08/2017 1040   BUN 17 09/08/2017 1040   CREATININE 1.02 09/08/2017 1040   CREATININE 0.93 04/23/2016 1633   CALCIUM 9.4 09/08/2017 1040   GFRNONAA  >60 03/29/2017 0851   GFRAA >60 03/29/2017 0851   Lab Results  Component Value Date  HGBA1C 5.6 03/08/2017   No results found for: INSULIN CBC    Component Value Date/Time   WBC 10.9 (H) 03/29/2017 0851   RBC 4.50 03/29/2017 0851   HGB 13.6 03/29/2017 0851   HCT 42.6 03/29/2017 0851   PLT 177 03/29/2017 0851   MCV 94.7 03/29/2017 0851   MCH 30.2 03/29/2017 0851   MCHC 31.9 03/29/2017 0851   RDW 12.8 03/29/2017 0851   LYMPHSABS 1.9 03/16/2017 0839   MONOABS 0.5 03/16/2017 0839   EOSABS 0.1 03/16/2017 0839   BASOSABS 0.0 03/16/2017 0839   Iron/TIBC/Ferritin/ %Sat No results found for: IRON, TIBC, FERRITIN, IRONPCTSAT Lipid Panel     Component Value Date/Time   CHOL 151 09/08/2017 1040   TRIG 142.0 09/08/2017 1040   HDL 47.90 09/08/2017 1040   CHOLHDL 3 09/08/2017 1040   VLDL 28.4 09/08/2017 1040   LDLCALC 74 09/08/2017 1040   Hepatic Function Panel     Component Value Date/Time   PROT 6.8 09/08/2017 1040   ALBUMIN 4.5 09/08/2017 1040   AST 15 09/08/2017 1040   ALT 15 09/08/2017 1040   ALKPHOS 59 09/08/2017 1040   BILITOT 0.9 09/08/2017 1040   BILIDIR 0.2 06/07/2014 0909      Component Value Date/Time   TSH 0.62 10/03/2015 1230   TSH 0.51 10/08/2010 0859   TSH 0.53 09/04/2009 1047    ECG  shows NSR with a rate of 51 BPM INDIRECT CALORIMETER done today shows a VO2 of 235 and a REE of 1632.  His calculated basal metabolic rate is 1610 thus his basal metabolic rate is worse than expected.    ASSESSMENT AND PLAN: Other fatigue - Plan: EKG 12-Lead, CBC With Differential, T3, T4, free, TSH  Shortness of breath on exertion - Plan: CBC With Differential  Essential hypertension - Plan: Comprehensive metabolic panel  Other hyperlipidemia - Plan: Lipid Panel With LDL/HDL Ratio  Vitamin D deficiency - Plan: VITAMIN D 25 Hydroxy (Vit-D Deficiency, Fractures)  Hyperglycemia - Plan: Comprehensive metabolic panel, Hemoglobin A1c, Insulin, random  Depression  screening  Class 3 severe obesity with serious comorbidity and body mass index (BMI) of 40.0 to 44.9 in adult, unspecified obesity type (HCC)  PLAN: Fatigue Eric [name] was informed that his fatigue may be related to obesity, depression or many other causes. Labs will be ordered, and in the meanwhile Hillis has agreed to work on diet, exercise and weight loss to help with fatigue. Proper sleep hygiene was discussed including the need for 7-8 hours of quality sleep each night. A sleep study was not ordered based on symptoms and Epworth score.  Dyspnea on exertion Eric [name]'s shortness of breath appears to be obesity related and exercise induced. He has agreed to work on weight loss and gradually increase exercise to treat his exercise induced shortness of breath. If Duriel follows our instructions and loses weight without improvement of his shortness of breath, we will plan to refer to pulmonology. We will monitor this condition regularly. Hulon agrees to this plan.  Hypertension We discussed sodium restriction, working on healthy weight loss, and a regular exercise program as the means to achieve improved blood pressure control. Eric [name] agreed with this plan and agreed to follow up as directed. We will recheck his blood pressure in 2 weeks and will continue to monitor his blood pressure as well as his progress with the above lifestyle modifications. He will continue his medications as prescribed and will watch for signs of hypotension as he continues his lifestyle modifications. We  will check labs today.  Hyperlipidemia Eric [name] was informed of the American Heart Association Guidelines emphasizing intensive lifestyle modifications as the first line treatment for hyperlipidemia. We discussed many lifestyle modifications today in depth. Dover will start diet and work on decreasing saturated fats such as fatty red meat, butter and many fried foods. He will also increase vegetables and lean protein in his  diet and work on exercise and weight loss efforts. We will check labs and Riot will continue his medications as prescribed.  Vitamin D Deficiency Eric [name] was informed that low vitamin D levels contributes to fatigue and are associated with obesity, breast, and colon cancer. We will check labs and will follow up for routine testing of vitamin D, at least 2-3 times per year. Eric [name] agrees to follow up with our clinic in 2 weeks.  Hyperglycemia Fasting labs will be obtained and results with be discussed with Eric [name] in 2 weeks at his follow up visit. In the meanwhile Eric [name] was started on a lower simple carbohydrate diet and will work on weight loss efforts.  Depression Screen Eric [name] had a strongly positive depression screening. Depression is commonly associated with obesity and often results in emotional eating behaviors. We will monitor this closely and work on CBT to help improve the non-hunger eating patterns. Referral to Psychology may be required if no improvement is seen as he continues in our clinic.  Obesity Eric [name] is currently in the action stage of change and his goal is to continue with weight loss efforts. I recommend Eric [name] begin the structured treatment plan as follows:  He has agreed to follow the Category 3 plan Eric [name] has been instructed to eventually work up to a goal of 150 minutes of combined cardio and strengthening exercise per week for weight loss and overall health benefits. We discussed the following Behavioral Modification Strategies today: increasing lean protein intake, decreasing simple carbohydrates , decrease eating out and work on meal planning and easy cooking plans   He was informed of the importance of frequent follow up visits to maximize his success with intensive lifestyle modifications for his multiple health conditions. He was informed we would discuss his lab results at his next visit unless there is a critical issue that needs to be addressed  sooner. Benjiman agreed to keep his next visit at the agreed upon time to discuss these results.    OBESITY BEHAVIORAL INTERVENTION VISIT  Today's visit was # 1   Starting weight: 288 lbs Starting date: 12/14/17 Today's weight : 288 lbs  Today's date: 12/14/2017 Total lbs lost to date: 0 At least 15 minutes were spent on discussing the following behavioral intervention visit.   ASK: We discussed the diagnosis of obesity with Eric [name] today and Gerik agreed to give [place] permission to discuss obesity behavioral modification therapy today.  ASSESS: Quantel has the diagnosis of obesity and his BMI today is 52.8 Bach is in the action stage of change   ADVISE: Makhai was educated on the multiple health risks of obesity as well as the benefit of weight loss to improve his health. He was advised of the need for long term treatment and the importance of lifestyle modifications to improve his current health and to decrease his risk of future health problems.  AGREE: Multiple dietary modification options and treatment options were discussed and  Jaime agreed to follow the recommendations documented in the above note.  ARRANGE: Thuan was educated on the importance of frequent visits to treat obesity as outlined per  CMS and USPSTF guidelines and agreed to schedule his next follow up appointment today.  I, Doreene Nest, am acting as transcriptionist for [name], MD  I have reviewed the above documentation for accuracy and completeness, and I agree with the above. -[name], MD

## 2017-12-15 ENCOUNTER — Encounter (INDEPENDENT_AMBULATORY_CARE_PROVIDER_SITE_OTHER): Payer: Self-pay | Admitting: Family Medicine

## 2017-12-15 DIAGNOSIS — L940 Localized scleroderma [morphea]: Secondary | ICD-10-CM | POA: Diagnosis not present

## 2017-12-15 DIAGNOSIS — L821 Other seborrheic keratosis: Secondary | ICD-10-CM | POA: Diagnosis not present

## 2017-12-15 DIAGNOSIS — L738 Other specified follicular disorders: Secondary | ICD-10-CM | POA: Diagnosis not present

## 2017-12-15 DIAGNOSIS — D485 Neoplasm of uncertain behavior of skin: Secondary | ICD-10-CM | POA: Diagnosis not present

## 2017-12-15 DIAGNOSIS — L570 Actinic keratosis: Secondary | ICD-10-CM | POA: Diagnosis not present

## 2017-12-15 LAB — LIPID PANEL WITH LDL/HDL RATIO
Cholesterol, Total: 150 mg/dL (ref 100–199)
HDL: 52 mg/dL (ref >39)
LDL Calculated: 78 mg/dL (ref 0–99)
LDl/HDL Ratio: 1.5 ratio (ref 0.0–3.6)
Triglycerides: 98 mg/dL (ref 0–149)
VLDL Cholesterol Cal: 20 mg/dL (ref 5–40)

## 2017-12-15 LAB — CBC WITH DIFFERENTIAL
Basophils Absolute: 0 10*3/uL (ref 0.0–0.2)
Basos: 0 %
EOS (ABSOLUTE): 0.1 10*3/uL (ref 0.0–0.4)
Eos: 1 %
Hematocrit: 45.1 % (ref 37.5–51.0)
Hemoglobin: 15.5 g/dL (ref 13.0–17.7)
Immature Grans (Abs): 0 10*3/uL (ref 0.0–0.1)
Immature Granulocytes: 0 %
Lymphocytes Absolute: 1.7 10*3/uL (ref 0.7–3.1)
Lymphs: 25 %
MCH: 30.8 pg (ref 26.6–33.0)
MCHC: 34.4 g/dL (ref 31.5–35.7)
MCV: 90 fL (ref 79–97)
Monocytes Absolute: 0.7 10*3/uL (ref 0.1–0.9)
Monocytes: 10 %
Neutrophils Absolute: 4.3 10*3/uL (ref 1.4–7.0)
Neutrophils: 64 %
RBC: 5.04 x10E6/uL (ref 4.14–5.80)
RDW: 13 % (ref 12.3–15.4)
WBC: 6.8 10*3/uL (ref 3.4–10.8)

## 2017-12-15 LAB — T3: T3, Total: 127 ng/dL (ref 71–180)

## 2017-12-15 LAB — COMPREHENSIVE METABOLIC PANEL
ALT: 16 IU/L (ref 0–44)
AST: 14 IU/L (ref 0–40)
Albumin/Globulin Ratio: 2.3 — ABNORMAL HIGH (ref 1.2–2.2)
Albumin: 4.5 g/dL (ref 3.5–4.8)
Alkaline Phosphatase: 63 IU/L (ref 39–117)
BUN/Creatinine Ratio: 18 (ref 10–24)
BUN: 19 mg/dL (ref 8–27)
Bilirubin Total: 0.6 mg/dL (ref 0.0–1.2)
CO2: 22 mmol/L (ref 20–29)
Calcium: 9.4 mg/dL (ref 8.6–10.2)
Chloride: 103 mmol/L (ref 96–106)
Creatinine, Ser: 1.06 mg/dL (ref 0.76–1.27)
GFR calc Af Amer: 81 mL/min/{1.73_m2} (ref >59)
GFR calc non Af Amer: 70 mL/min/{1.73_m2} (ref >59)
Globulin, Total: 2 g/dL (ref 1.5–4.5)
Glucose: 87 mg/dL (ref 65–99)
Potassium: 4.5 mmol/L (ref 3.5–5.2)
Sodium: 141 mmol/L (ref 134–144)
Total Protein: 6.5 g/dL (ref 6.0–8.5)

## 2017-12-15 LAB — TSH: TSH: 1.06 u[IU]/mL (ref 0.450–4.500)

## 2017-12-15 LAB — HEMOGLOBIN A1C
Est. average glucose Bld gHb Est-mCnc: 117 mg/dL
Hgb A1c MFr Bld: 5.7 % — ABNORMAL HIGH (ref 4.8–5.6)

## 2017-12-15 LAB — VITAMIN D 25 HYDROXY (VIT D DEFICIENCY, FRACTURES): Vit D, 25-Hydroxy: 23.2 ng/mL — ABNORMAL LOW (ref 30.0–100.0)

## 2017-12-15 LAB — INSULIN, RANDOM: INSULIN: 21.3 u[IU]/mL (ref 2.6–24.9)

## 2017-12-15 LAB — T4, FREE: Free T4: 1.53 ng/dL (ref 0.82–1.77)

## 2017-12-19 ENCOUNTER — Encounter: Payer: Self-pay | Admitting: Family Medicine

## 2017-12-20 DIAGNOSIS — L940 Localized scleroderma [morphea]: Secondary | ICD-10-CM | POA: Diagnosis not present

## 2017-12-25 ENCOUNTER — Other Ambulatory Visit: Payer: Self-pay | Admitting: Family Medicine

## 2017-12-28 ENCOUNTER — Encounter (INDEPENDENT_AMBULATORY_CARE_PROVIDER_SITE_OTHER): Payer: Self-pay | Admitting: Family Medicine

## 2017-12-28 ENCOUNTER — Ambulatory Visit (INDEPENDENT_AMBULATORY_CARE_PROVIDER_SITE_OTHER): Payer: Medicare Other | Admitting: Family Medicine

## 2017-12-28 VITALS — BP 120/82 | HR 54 | Temp 98.4°F | Ht 68.0 in | Wt 277.0 lb

## 2017-12-28 DIAGNOSIS — E559 Vitamin D deficiency, unspecified: Secondary | ICD-10-CM

## 2017-12-28 DIAGNOSIS — R7303 Prediabetes: Secondary | ICD-10-CM

## 2017-12-28 DIAGNOSIS — Z6841 Body Mass Index (BMI) 40.0 and over, adult: Secondary | ICD-10-CM | POA: Diagnosis not present

## 2017-12-28 MED ORDER — VITAMIN D (ERGOCALCIFEROL) 1.25 MG (50000 UNIT) PO CAPS: 50000.0000 [IU] | ORAL_CAPSULE | ORAL | 0 refills | Status: DC

## 2017-12-28 MED ORDER — METFORMIN HCL 500 MG PO TABS: 500.0000 mg | ORAL_TABLET | Freq: Every day | ORAL | 0 refills | Status: DC

## 2018-01-02 NOTE — Progress Notes (Signed)
Office: [phone]  /  Fax: [phone]   HPI:   Chief Complaint: OBESITY Eric [name] is here to discuss his progress with his obesity treatment plan. He is on the Category 3 plan and is following his eating plan approximately 95 % of the time. He states he is exercising 0 minutes 0 times per week. Eric [name] did very well with diet and weight loss. He was bored at times, but hunger improved with diet. He is going on a hunting trip with his friend and would like to discuss strategies to do better with diet.  His weight is 277 lb (125.6 kg) today and has had a weight loss of 11 pounds over a period of 2 weeks since his last visit. He has lost 11 lbs since starting treatment with [place].  Vitamin D Deficiency Eric [name] has a new diagnosis of vitamin D deficiency. He is not on Vit D, he notes fatigue and denies nausea, vomiting or muscle weakness.  Pre-Diabetes Eric [name] has a new diagnosis of pre-diabetes based on his elevated Hgb A1c at 5.7 and was informed this puts him at greater risk of developing diabetes. He is not on metformin and notes polyphagia but this has improved with diet and exercise.   ALLERGIES: Allergies  Allergen Reactions  . Lisinopril Cough    MEDICATIONS: Current Outpatient Medications on File Prior to Visit  Medication Sig Dispense Refill  . aspirin EC 81 MG tablet Take 81 mg by mouth daily.    [name] atorvastatin (LIPITOR) 40 MG tablet TAKE 1 TABLET DAILY 90 tablet 4  . Coenzyme Q10 (COQ10) 200 MG CAPS Take 200 mg by mouth at bedtime.    [name] loratadine (CLARITIN) 10 MG tablet Take 10 mg by mouth daily as needed for allergies.     . meloxicam (MOBIC) 15 MG tablet TAKE 1 TABLET DAILY 90 tablet 4  . Multiple Vitamin (MULTIVITAMIN WITH MINERALS) TABS tablet Take 1 tablet by mouth daily.    . NONFORMULARY OR COMPOUNDED ITEM apap cpap maching--auto adjusting pressure  dx sleep apnea 1 each 0  . pantoprazole (PROTONIX) 40 MG tablet TAKE 1 TABLET DAILY 90 tablet 3  . tamsulosin  (FLOMAX) 0.4 MG CAPS capsule Take 0.4 mg by mouth daily 90 capsule 3  . valsartan (DIOVAN) 80 MG tablet Take 1 tablet (80 mg total) by mouth daily. 90 tablet 3   No current facility-administered medications on file prior to visit.     PAST MEDICAL HISTORY: Past Medical History:  Diagnosis Date  . Alcohol abuse   . Back pain   . Colon polyps   . Diverticulosis of colon   . DJD (degenerative joint disease)   . Dysrhythmia    Palpations from caffiene  . Enlarged prostate   . GERD (gastroesophageal reflux disease)   . Hearing loss   . Hip osteoarthritis    Left  . History of hay fever   . Hx of cardiovascular stress test    Lexiscan Myoview (04/2013):  No ischemia, EF 61%; low risk  . Hyperlipemia   . Hypertension   . Internal hemorrhoids without mention of complication   . Leg edema   . Neuroma of foot    left  . Obesity   . Osteoarthrosis, unspecified whether generalized or localized, unspecified site   . Palpitations   . 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 Primary localized osteoarthrosis of right shoulder 05/25/2016  . Recent weight loss    75lbs  . Seasonal allergies   .  Sleep apnea    CPAP  . Thyroid nodule   . Tremor of both hands     PAST SURGICAL HISTORY: Past Surgical History:  Procedure Laterality Date  . COLONOSCOPY    . SHOULDER SURGERY  2007-07-08   right  . TONSILLECTOMY    . TOTAL HIP ARTHROPLASTY Left 03/28/2017  . TOTAL HIP ARTHROPLASTY Left 03/28/2017   Procedure: TOTAL HIP ARTHROPLASTY ANTERIOR APPROACH;  Surgeon: Frederik Pear, MD;  Location: [facility];  Service: Orthopedics;  Laterality: Left;  . TOTAL SHOULDER ARTHROPLASTY Left 10/14/2015   Procedure: LEFT TOTAL SHOULDER ARTHROPLASTY;  Surgeon: Marchia Bond, MD;  Location: [place];  Service: Orthopedics;  Laterality: Left;  . TOTAL SHOULDER ARTHROPLASTY Right 05/25/2016   Procedure: TOTAL SHOULDER ARTHROPLASTY;  Surgeon: Marchia Bond, MD;  Location: [place];  Service: Orthopedics;  Laterality:  Right;    SOCIAL HISTORY: Social History   Tobacco Use  . Smoking status: Never Smoker  . Smokeless tobacco: Never Used  Substance Use Topics  . Alcohol use: Yes    Alcohol/week: 0.0 standard drinks    Comment: rare----12 a year  . Drug use: No    FAMILY HISTORY: Family History  Problem Relation Age of Onset  . Arthritis Mother   . Cancer Mother 44       breast  . Hyperlipidemia Mother   . Colon polyps Mother 38  . Hypertension Mother   . Heart disease Mother   . Depression Mother   . Anxiety disorder Mother   . Obesity Mother   . Heart disease Father        CAD--passed away 07/08/2011 age 14  . Hyperlipidemia Father   . Hypertension Father   . Obesity Father   . Diabetes Father   . Depression Father   . Sleep apnea Father   . Cancer Sister 37       breast  . Cancer Brother 49       prostate  . Breast cancer Unknown   . Prostate cancer Unknown   . Colon cancer Neg Hx     ROS: Review of Systems  Constitutional: Positive for malaise/fatigue and weight loss.  Gastrointestinal: Negative for nausea and vomiting.  Musculoskeletal:       Negative muscle weakness  Endo/Heme/Allergies:       Positive polyphagia    PHYSICAL EXAM: Blood pressure 120/82, pulse (!) 54, temperature 98.4 F (36.9 C), temperature source Oral, height 5\' 8"  (1.727 m), weight 277 lb (125.6 kg), SpO2 98 %. Body mass index is 42.12 kg/m. Physical Exam  Constitutional: He is oriented to person, place, and time. He appears well-developed and well-nourished.  Cardiovascular: Normal rate.  Pulmonary/Chest: Effort normal.  Musculoskeletal: Normal range of motion.  Neurological: He is oriented to person, place, and time.  Skin: Skin is warm and dry.  Psychiatric: He has a normal mood and affect. His behavior is normal.  Vitals reviewed.   RECENT LABS AND TESTS: BMET    Component Value Date/Time   NA 141 12/14/2017 1030   K 4.5 12/14/2017 1030   CL 103 12/14/2017 1030   CO2 22 12/14/2017  1030   GLUCOSE 87 12/14/2017 1030   GLUCOSE 90 09/08/2017 1040   BUN 19 12/14/2017 1030   CREATININE 1.06 12/14/2017 1030   CREATININE 0.93 04/23/2016 1633   CALCIUM 9.4 12/14/2017 1030   GFRNONAA 70 12/14/2017 1030   GFRAA 81 12/14/2017 1030   Lab Results  Component Value Date   HGBA1C 5.7 (H) 12/14/2017  HGBA1C 5.6 03/08/2017   HGBA1C 5.5 11/18/2016   HGBA1C 5.5 03/05/2015   Lab Results  Component Value Date   INSULIN 21.3 12/14/2017   CBC    Component Value Date/Time   WBC 6.8 12/14/2017 1030   WBC 10.9 (H) 03/29/2017 0851   RBC 5.04 12/14/2017 1030   RBC 4.50 03/29/2017 0851   HGB 15.5 12/14/2017 1030   HCT 45.1 12/14/2017 1030   PLT 177 03/29/2017 0851   MCV 90 12/14/2017 1030   MCH 30.8 12/14/2017 1030   MCH 30.2 03/29/2017 0851   MCHC 34.4 12/14/2017 1030   MCHC 31.9 03/29/2017 0851   RDW 13.0 12/14/2017 1030   LYMPHSABS 1.7 12/14/2017 1030   MONOABS 0.5 03/16/2017 0839   EOSABS 0.1 12/14/2017 1030   BASOSABS 0.0 12/14/2017 1030   Iron/TIBC/Ferritin/ %Sat No results found for: IRON, TIBC, FERRITIN, IRONPCTSAT Lipid Panel     Component Value Date/Time   CHOL 150 12/14/2017 1030   TRIG 98 12/14/2017 1030   HDL 52 12/14/2017 1030   CHOLHDL 3 09/08/2017 1040   VLDL 28.4 09/08/2017 1040   LDLCALC 78 12/14/2017 1030   Hepatic Function Panel     Component Value Date/Time   PROT 6.5 12/14/2017 1030   ALBUMIN 4.5 12/14/2017 1030   AST 14 12/14/2017 1030   ALT 16 12/14/2017 1030   ALKPHOS 63 12/14/2017 1030   BILITOT 0.6 12/14/2017 1030   BILIDIR 0.2 06/07/2014 0909      Component Value Date/Time   TSH 1.060 12/14/2017 1030   TSH 0.62 10/03/2015 1230   TSH 0.51 10/08/2010 0859  Results for Eric [name], Eric [name] "Eric [name]" (MRN [id number]) as of 01/02/2018 10:13  Ref. Range 12/14/2017 10:30  Vitamin D, 25-Hydroxy Latest Ref Range: 30.0 - 100.0 ng/mL 23.2 ([name])    ASSESSMENT AND PLAN: Vitamin D deficiency - Plan: Vitamin D, Ergocalciferol, (DRISDOL) 50000  units CAPS capsule  Prediabetes - Plan: metFORMIN (GLUCOPHAGE) 500 MG tablet  Class 3 severe obesity with serious comorbidity and body mass index (BMI) of 40.0 to 44.9 in adult, unspecified obesity type ([place])  PLAN:  Vitamin D Deficiency Eric [name] was informed that low vitamin D levels contributes to fatigue and are associated with obesity, breast, and colon cancer. Eric [name] agrees to start prescription Vit D @50 ,000 IU every week #4 with no refills. He will follow up for routine testing of vitamin D, at least 2-3 times per year. He was informed of the risk of over-replacement of vitamin D and agrees to not increase his dose unless he discusses this with [place] first. Eric [name] agrees to follow up with our clinic in 2 weeks.  Pre-Diabetes Eric [name] will continue to work on weight loss, exercise, and decreasing simple carbohydrates in his diet to help decrease the risk of diabetes. We dicussed metformin including benefits and risks. He was informed that eating too many simple carbohydrates or too many calories at one sitting increases the likelihood of GI side effects. Eric [name] agrees to start metformin 500 mg q AM #30 with no refills. Eric [name] agrees to follow up with our clinic in 2 weeks as directed to monitor his progress.  Obesity Eric [name] is currently in the action stage of change. As such, his goal is to continue with weight loss efforts He has agreed to follow the Category 3 plan Eric [name] has been instructed to work up to a goal of 150 minutes of combined cardio and strengthening exercise per week for weight loss and overall health benefits. We discussed the following Behavioral  Modification Strategies today: increasing lean protein intake, decreasing simple carbohydrates, travel eating strategies, ways to avoid boredom eating, keeping healthy foods in the home, and better snacking choices   Eric [name] has agreed to follow up with our clinic in 2 weeks. He was informed of the importance of frequent follow  up visits to maximize his success with intensive lifestyle modifications for his multiple health conditions.   OBESITY BEHAVIORAL INTERVENTION VISIT  Today's visit was # 2   Starting weight: 288 lbs Starting date: 12/14/17 Today's weight : 277 lbs Today's date: 12/28/2017 Total lbs lost to date: 11 At least 15 minutes were spent on discussing the following behavioral intervention visit.   ASK: We discussed the diagnosis of obesity with Eric [name] today and Eric [name] agreed to give [place] permission to discuss obesity behavioral modification therapy today.  ASSESS: Eric [name] has the diagnosis of obesity and his BMI today is 42.13 Eric [name] is in the action stage of change   ADVISE: Eric [name] was educated on the multiple health risks of obesity as well as the benefit of weight loss to improve his health. He was advised of the need for long term treatment and the importance of lifestyle modifications to improve his current health and to decrease his risk of future health problems.  AGREE: Multiple dietary modification options and treatment options were discussed and  Eric [name] agreed to follow the recommendations documented in the above note.  ARRANGE: Eric [name] was educated on the importance of frequent visits to treat obesity as outlined per CMS and USPSTF guidelines and agreed to schedule his next follow up appointment today.  I, Eric [name], am acting as transcriptionist for [name], MD  I have reviewed the above documentation for accuracy and completeness, and I agree with the above. -[name], MD

## 2018-01-19 ENCOUNTER — Ambulatory Visit (INDEPENDENT_AMBULATORY_CARE_PROVIDER_SITE_OTHER): Payer: Medicare Other | Admitting: Family Medicine

## 2018-01-19 VITALS — BP 119/76 | HR 60 | Temp 98.1°F | Ht 68.0 in | Wt 270.0 lb

## 2018-01-19 DIAGNOSIS — R7303 Prediabetes: Secondary | ICD-10-CM

## 2018-01-19 DIAGNOSIS — E559 Vitamin D deficiency, unspecified: Secondary | ICD-10-CM

## 2018-01-19 DIAGNOSIS — Z6841 Body Mass Index (BMI) 40.0 and over, adult: Secondary | ICD-10-CM | POA: Diagnosis not present

## 2018-01-19 MED ORDER — METFORMIN HCL 500 MG PO TABS: 500.0000 mg | ORAL_TABLET | Freq: Every day | ORAL | 0 refills | Status: DC

## 2018-01-19 MED ORDER — VITAMIN D (ERGOCALCIFEROL) 1.25 MG (50000 UNIT) PO CAPS: 50000.0000 [IU] | ORAL_CAPSULE | ORAL | 0 refills | Status: DC

## 2018-01-23 ENCOUNTER — Encounter (INDEPENDENT_AMBULATORY_CARE_PROVIDER_SITE_OTHER): Payer: Self-pay | Admitting: Family Medicine

## 2018-01-23 NOTE — Progress Notes (Signed)
Office: [phone]  /  Fax: [phone]   HPI:   Chief Complaint: OBESITY Eric [name] is here to discuss his progress with his obesity treatment plan. He is on the Category 3 plan and is following his eating plan approximately 98 % of the time. He states he is exercising 0 minutes 0 times per week. Eric [name] continues to do well with weight loss. He sometimes misses his fruit at lunch, but eats all his other food well including vegetables.  His weight is 270 lb (122.5 kg) today and has had a weight loss of 7 pounds over a period of 3 weeks since his last visit. He has lost 18 lbs since starting treatment with [place].  Pre-Diabetes Eric [name] has a diagnosis of pre-diabetes based on his elevated Hgb A1c and was informed this puts him at greater risk of developing diabetes. He is stable on metformin and doing well with diet and exercise to decrease risk of diabetes. He denies nausea, vomiting, or hypoglycemia.  Vitamin D Deficiency Eric [name] has a diagnosis of vitamin D deficiency. He is stable on prescription Vit D, but not yet at goal. He denies nausea, vomiting or muscle weakness.  ALLERGIES: Allergies  Allergen Reactions  . Lisinopril Cough    MEDICATIONS: Current Outpatient Medications on File Prior to Visit  Medication Sig Dispense Refill  . aspirin EC 81 MG tablet Take 81 mg by mouth daily.    [name] atorvastatin (LIPITOR) 40 MG tablet TAKE 1 TABLET DAILY 90 tablet 4  . Coenzyme Q10 (COQ10) 200 MG CAPS Take 200 mg by mouth at bedtime.    [name] loratadine (CLARITIN) 10 MG tablet Take 10 mg by mouth daily as needed for allergies.     . meloxicam (MOBIC) 15 MG tablet TAKE 1 TABLET DAILY 90 tablet 4  . Multiple Vitamin (MULTIVITAMIN WITH MINERALS) TABS tablet Take 1 tablet by mouth daily.    . NONFORMULARY OR COMPOUNDED ITEM apap cpap maching--auto adjusting pressure  dx sleep apnea 1 each 0  . pantoprazole (PROTONIX) 40 MG tablet TAKE 1 TABLET DAILY 90 tablet 3  . tamsulosin (FLOMAX) 0.4 MG CAPS  capsule Take 0.4 mg by mouth daily 90 capsule 3  . valsartan (DIOVAN) 80 MG tablet Take 1 tablet (80 mg total) by mouth daily. 90 tablet 3   No current facility-administered medications on file prior to visit.     PAST MEDICAL HISTORY: Past Medical History:  Diagnosis Date  . Alcohol abuse   . Back pain   . Colon polyps   . Diverticulosis of colon   . DJD (degenerative joint disease)   . Dysrhythmia    Palpations from caffiene  . Enlarged prostate   . GERD (gastroesophageal reflux disease)   . Hearing loss   . Hip osteoarthritis    Left  . History of hay fever   . Hx of cardiovascular stress test    Lexiscan Myoview (04/2013):  No ischemia, EF 61%; low risk  . Hyperlipemia   . Hypertension   . Internal hemorrhoids without mention of complication   . Leg edema   . Neuroma of foot    left  . Obesity   . Osteoarthrosis, unspecified whether generalized or localized, unspecified site   . Palpitations   . 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 Primary localized osteoarthrosis of right shoulder 05/25/2016  . Recent weight loss    75lbs  . Seasonal allergies   . Sleep apnea    CPAP  . Thyroid nodule   .  Tremor of both hands     PAST SURGICAL HISTORY: Past Surgical History:  Procedure Laterality Date  . COLONOSCOPY    . SHOULDER SURGERY  07-13-2007   right  . TONSILLECTOMY    . TOTAL HIP ARTHROPLASTY Left 03/28/2017  . TOTAL HIP ARTHROPLASTY Left 03/28/2017   Procedure: TOTAL HIP ARTHROPLASTY ANTERIOR APPROACH;  Surgeon: Frederik Pear, MD;  Location: [facility];  Service: Orthopedics;  Laterality: Left;  . TOTAL SHOULDER ARTHROPLASTY Left 10/14/2015   Procedure: LEFT TOTAL SHOULDER ARTHROPLASTY;  Surgeon: Marchia Bond, MD;  Location: [facility];  Service: Orthopedics;  Laterality: Left;  . TOTAL SHOULDER ARTHROPLASTY Right 05/25/2016   Procedure: TOTAL SHOULDER ARTHROPLASTY;  Surgeon: Marchia Bond, MD;  Location: [facility];  Service: Orthopedics;  Laterality: Right;    SOCIAL  HISTORY: Social History   Tobacco Use  . Smoking status: Never Smoker  . Smokeless tobacco: Never Used  Substance Use Topics  . Alcohol use: Yes    Alcohol/week: 0.0 standard drinks    Comment: rare----12 a year  . Drug use: No    FAMILY HISTORY: Family History  Problem Relation Age of Onset  . Arthritis Mother   . Cancer Mother 39       breast  . Hyperlipidemia Mother   . Colon polyps Mother 59  . Hypertension Mother   . Heart disease Mother   . Depression Mother   . Anxiety disorder Mother   . Obesity Mother   . Heart disease Father        CAD--passed away 07/13/11 age 86  . Hyperlipidemia Father   . Hypertension Father   . Obesity Father   . Diabetes Father   . Depression Father   . Sleep apnea Father   . Cancer Sister 57       breast  . Cancer Brother 63       prostate  . Breast cancer Unknown   . Prostate cancer Unknown   . Colon cancer Neg Hx     ROS: Review of Systems  Constitutional: Positive for weight loss.  Gastrointestinal: Negative for nausea and vomiting.  Musculoskeletal:       Negative muscle weakness  Endo/Heme/Allergies:       Negative hypoglycemia    PHYSICAL EXAM: Blood pressure 119/76, pulse 60, temperature 98.1 F (36.7 C), temperature source Oral, height 5\' 8"  (1.727 m), weight 270 lb (122.5 kg), SpO2 98 %. Body mass index is 41.05 kg/m. Physical Exam  Constitutional: He is oriented to person, place, and time. He appears well-developed and well-nourished.  Cardiovascular: Normal rate.  Pulmonary/Chest: Effort normal.  Musculoskeletal: Normal range of motion.  Neurological: He is oriented to person, place, and time.  Skin: Skin is warm and dry.  Psychiatric: He has a normal mood and affect. His behavior is normal.  Vitals reviewed.   RECENT LABS AND TESTS: BMET    Component Value Date/Time   NA 141 12/14/2017 1030   K 4.5 12/14/2017 1030   CL 103 12/14/2017 1030   CO2 22 12/14/2017 1030   GLUCOSE 87 12/14/2017 1030    GLUCOSE 90 09/08/2017 1040   BUN 19 12/14/2017 1030   CREATININE 1.06 12/14/2017 1030   CREATININE 0.93 04/23/2016 1633   CALCIUM 9.4 12/14/2017 1030   GFRNONAA 70 12/14/2017 1030   GFRAA 81 12/14/2017 1030   Lab Results  Component Value Date   HGBA1C 5.7 (H) 12/14/2017   HGBA1C 5.6 03/08/2017   HGBA1C 5.5 11/18/2016   HGBA1C 5.5 03/05/2015  Lab Results  Component Value Date   INSULIN 21.3 12/14/2017   CBC    Component Value Date/Time   WBC 6.8 12/14/2017 1030   WBC 10.9 (H) 03/29/2017 0851   RBC 5.04 12/14/2017 1030   RBC 4.50 03/29/2017 0851   HGB 15.5 12/14/2017 1030   HCT 45.1 12/14/2017 1030   PLT 177 03/29/2017 0851   MCV 90 12/14/2017 1030   MCH 30.8 12/14/2017 1030   MCH 30.2 03/29/2017 0851   MCHC 34.4 12/14/2017 1030   MCHC 31.9 03/29/2017 0851   RDW 13.0 12/14/2017 1030   LYMPHSABS 1.7 12/14/2017 1030   MONOABS 0.5 03/16/2017 0839   EOSABS 0.1 12/14/2017 1030   BASOSABS 0.0 12/14/2017 1030   Iron/TIBC/Ferritin/ %Sat No results found for: IRON, TIBC, FERRITIN, IRONPCTSAT Lipid Panel     Component Value Date/Time   CHOL 150 12/14/2017 1030   TRIG 98 12/14/2017 1030   HDL 52 12/14/2017 1030   CHOLHDL 3 09/08/2017 1040   VLDL 28.4 09/08/2017 1040   LDLCALC 78 12/14/2017 1030   Hepatic Function Panel     Component Value Date/Time   PROT 6.5 12/14/2017 1030   ALBUMIN 4.5 12/14/2017 1030   AST 14 12/14/2017 1030   ALT 16 12/14/2017 1030   ALKPHOS 63 12/14/2017 1030   BILITOT 0.6 12/14/2017 1030   BILIDIR 0.2 06/07/2014 0909      Component Value Date/Time   TSH 1.060 12/14/2017 1030   TSH 0.62 10/03/2015 1230   TSH 0.51 10/08/2010 0859  Results for Brownley, Shahzaib L "MIKE" (MRN [id number]) as of 01/23/2018 12:35  Ref. Range 12/14/2017 10:30  Vitamin D, 25-Hydroxy Latest Ref Range: 30.0 - 100.0 ng/mL 23.2 (L)    ASSESSMENT AND PLAN: Prediabetes - Plan: metFORMIN (GLUCOPHAGE) 500 MG tablet  Vitamin D deficiency - Plan: Vitamin D,  Ergocalciferol, (DRISDOL) 50000 units CAPS capsule  Class 3 severe obesity with serious comorbidity and body mass index (BMI) of 40.0 to 44.9 in adult, unspecified obesity type Eric [name])  PLAN:  Pre-Diabetes Eric [name] will continue to work on weight loss, diet, exercise, and decreasing simple carbohydrates in his diet to help decrease the risk of diabetes. We dicussed metformin including benefits and risks. He was informed that eating too many simple carbohydrates or too many calories at one sitting increases the likelihood of GI side effects. Nikoli agrees to continue taking metformin 500 mg q AM #30 and we will refill for 1 month. Addam agrees to follow up with our clinic in 3 weeks as directed to monitor his progress.  Vitamin D Deficiency Eric [name] was informed that low vitamin D levels contributes to fatigue and are associated with obesity, breast, and colon cancer. Eric [name] agrees to continue taking prescription Vit D @50 ,000 IU every week #4 and we will refill for 1 month. He will follow up for routine testing of vitamin D, at least 2-3 times per year. He was informed of the risk of over-replacement of vitamin D and agrees to not increase his dose unless he discusses this with [place] first. Eric [name] agrees to follow up with our clinic in 3 weeks.  Obesity Eric [name] is currently in the action stage of change. As such, his goal is to continue with weight loss efforts He has agreed to follow the Category 3 plan Eric [name] has been instructed to work up to a goal of 150 minutes of combined cardio and strengthening exercise per week for weight loss and overall health benefits. We discussed the following Behavioral Modification Strategies today: increase  H20 intake   Eric [name] has agreed to follow up with our clinic in 3 weeks. He was informed of the importance of frequent follow up visits to maximize his success with intensive lifestyle modifications for his multiple health conditions.   OBESITY BEHAVIORAL  INTERVENTION VISIT  Today's visit was # 3   Starting weight: 288 lbs Starting date: 12/14/17 Today's weight : 270 lbs  Today's date: 01/19/2018 Total lbs lost to date: 18 At least 15 minutes were spent on discussing the following behavioral intervention visit.   ASK: We discussed the diagnosis of obesity with Eric [name] today and Eric [name] agreed to give [place] permission to discuss obesity behavioral modification therapy today.  ASSESS: Eric [name] has the diagnosis of obesity and his BMI today is 41.06 Eric [name] is in the action stage of change   ADVISE: Eric [name] was educated on the multiple health risks of obesity as well as the benefit of weight loss to improve his health. He was advised of the need for long term treatment and the importance of lifestyle modifications to improve his current health and to decrease his risk of future health problems.  AGREE: Multiple dietary modification options and treatment options were discussed and  Eric [name] agreed to follow the recommendations documented in the above note.  ARRANGE: Fines was educated on the importance of frequent visits to treat obesity as outlined per CMS and USPSTF guidelines and agreed to schedule his next follow up appointment today.  I, Trixie Dredge, am acting as transcriptionist for Eric Nip, MD  I have reviewed the above documentation for accuracy and completeness, and I agree with the above. -Eric Nip, MD

## 2018-02-09 ENCOUNTER — Ambulatory Visit (INDEPENDENT_AMBULATORY_CARE_PROVIDER_SITE_OTHER): Payer: Medicare Other | Admitting: Family Medicine

## 2018-02-09 VITALS — BP 123/79 | HR 52 | Temp 97.8°F | Ht 68.0 in | Wt 264.0 lb

## 2018-02-09 DIAGNOSIS — R7303 Prediabetes: Secondary | ICD-10-CM

## 2018-02-09 DIAGNOSIS — Z6841 Body Mass Index (BMI) 40.0 and over, adult: Secondary | ICD-10-CM | POA: Diagnosis not present

## 2018-02-09 DIAGNOSIS — E559 Vitamin D deficiency, unspecified: Secondary | ICD-10-CM | POA: Diagnosis not present

## 2018-02-09 MED ORDER — VITAMIN D (ERGOCALCIFEROL) 1.25 MG (50000 UNIT) PO CAPS: 50000.0000 [IU] | ORAL_CAPSULE | ORAL | 0 refills | Status: DC

## 2018-02-09 MED ORDER — METFORMIN HCL 500 MG PO TABS: 500.0000 mg | ORAL_TABLET | Freq: Every day | ORAL | 0 refills | Status: DC

## 2018-02-14 NOTE — Progress Notes (Signed)
Office: [phone]  /  Fax: [phone]   HPI:   Chief Complaint: OBESITY Eric [name] is here to discuss his progress with his obesity treatment plan. He is on the Category 3 plan and is following his eating plan approximately 95 % of the time. He states he is walking for 20-30 minutes 1 time per week. Eric [name] continues to do well with diet prescription. He states his hunger is controlled. He is doing well avoiding temptations.  His weight is 264 lb (119.7 kg) today and has had a weight loss of 6 pounds over a period of 3 weeks since his last visit. He has lost 24 lbs since starting treatment with [place].  Pre-Diabetes Eric [name] has a diagnosis of pre-diabetes based on his elevated Hgb A1c and was informed this puts him at greater risk of developing diabetes. He is stable on metformin and continues to work on diet and exercise to decrease risk of diabetes. He denies nausea, vomiting, or hypoglycemia.  Vitamin D Deficiency Eric [name] has a diagnosis of vitamin D deficiency. He is stable on prescription Vit D and denies nausea, vomiting or muscle weakness.  ALLERGIES: Allergies  Allergen Reactions  . Lisinopril Cough    MEDICATIONS: Current Outpatient Medications on File Prior to Visit  Medication Sig Dispense Refill  . aspirin EC 81 MG tablet Take 81 mg by mouth daily.    [name] atorvastatin (LIPITOR) 40 MG tablet TAKE 1 TABLET DAILY 90 tablet 4  . Coenzyme Q10 (COQ10) 200 MG CAPS Take 200 mg by mouth at bedtime.    [name] loratadine (CLARITIN) 10 MG tablet Take 10 mg by mouth daily as needed for allergies.     . meloxicam (MOBIC) 15 MG tablet TAKE 1 TABLET DAILY 90 tablet 4  . Multiple Vitamin (MULTIVITAMIN WITH MINERALS) TABS tablet Take 1 tablet by mouth daily.    . NONFORMULARY OR COMPOUNDED ITEM apap cpap maching--auto adjusting pressure  dx sleep apnea 1 each 0  . pantoprazole (PROTONIX) 40 MG tablet TAKE 1 TABLET DAILY 90 tablet 3  . tamsulosin (FLOMAX) 0.4 MG CAPS capsule Take 0.4 mg by mouth  daily 90 capsule 3  . valsartan (DIOVAN) 80 MG tablet Take 1 tablet (80 mg total) by mouth daily. 90 tablet 3   No current facility-administered medications on file prior to visit.     PAST MEDICAL HISTORY: Past Medical History:  Diagnosis Date  . Alcohol abuse   . Back pain   . Colon polyps   . Diverticulosis of colon   . DJD (degenerative joint disease)   . Dysrhythmia    Palpations from caffiene  . Enlarged prostate   . GERD (gastroesophageal reflux disease)   . Hearing loss   . Hip osteoarthritis    Left  . History of hay fever   . Hx of cardiovascular stress test    Lexiscan Myoview (04/2013):  No ischemia, EF 61%; low risk  . Hyperlipemia   . Hypertension   . Internal hemorrhoids without mention of complication   . Leg edema   . Neuroma of foot    left  . Obesity   . Osteoarthrosis, unspecified whether generalized or localized, unspecified site   . Palpitations   . 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 Primary localized osteoarthrosis of right shoulder 05/25/2016  . Recent weight loss    75lbs  . Seasonal allergies   . Sleep apnea    CPAP  . Thyroid nodule   . Tremor of both hands  PAST SURGICAL HISTORY: Past Surgical History:  Procedure Laterality Date  . COLONOSCOPY    . SHOULDER SURGERY  2007-07-13   right  . TONSILLECTOMY    . TOTAL HIP ARTHROPLASTY Left 03/28/2017  . TOTAL HIP ARTHROPLASTY Left 03/28/2017   Procedure: TOTAL HIP ARTHROPLASTY ANTERIOR APPROACH;  Surgeon: Frederik Pear, MD;  Location: [place];  Service: Orthopedics;  Laterality: Left;  . TOTAL SHOULDER ARTHROPLASTY Left 10/14/2015   Procedure: LEFT TOTAL SHOULDER ARTHROPLASTY;  Surgeon: Marchia Bond, MD;  Location: [place];  Service: Orthopedics;  Laterality: Left;  . TOTAL SHOULDER ARTHROPLASTY Right 05/25/2016   Procedure: TOTAL SHOULDER ARTHROPLASTY;  Surgeon: Marchia Bond, MD;  Location: [place];  Service: Orthopedics;  Laterality: Right;    SOCIAL HISTORY: Social History    Tobacco Use  . Smoking status: Never Smoker  . Smokeless tobacco: Never Used  Substance Use Topics  . Alcohol use: Yes    Alcohol/week: 0.0 standard drinks    Comment: rare----12 a year  . Drug use: No    FAMILY HISTORY: Family History  Problem Relation Age of Onset  . Arthritis Mother   . Cancer Mother 58       breast  . Hyperlipidemia Mother   . Colon polyps Mother 65  . Hypertension Mother   . Heart disease Mother   . Depression Mother   . Anxiety disorder Mother   . Obesity Mother   . Heart disease Father        CAD--passed away 07/13/11 age 80  . Hyperlipidemia Father   . Hypertension Father   . Obesity Father   . Diabetes Father   . Depression Father   . Sleep apnea Father   . Cancer Sister 25       breast  . Cancer Brother 38       prostate  . Breast cancer Unknown   . Prostate cancer Unknown   . Colon cancer Neg Hx     ROS: Review of Systems  Constitutional: Positive for weight loss.  Gastrointestinal: Negative for nausea and vomiting.  Musculoskeletal:       Negative muscle weakness  Endo/Heme/Allergies:       Negative hypoglycemia    PHYSICAL EXAM: Blood pressure 123/79, pulse (!) 52, temperature 97.8 F (36.6 C), temperature source Oral, height 5\' 8"  (1.727 m), weight 264 lb (119.7 kg), SpO2 97 %. Body mass index is 40.14 kg/m. Physical Exam  Constitutional: He is oriented to person, place, and time. He appears well-developed and well-nourished.  Cardiovascular: Normal rate.  Pulmonary/Chest: Effort normal.  Musculoskeletal: Normal range of motion.  Neurological: He is oriented to person, place, and time.  Skin: Skin is warm and dry.  Psychiatric: He has a normal mood and affect. His behavior is normal.  Vitals reviewed.   RECENT LABS AND TESTS: BMET    Component Value Date/Time   NA 141 12/14/2017 1030   K 4.5 12/14/2017 1030   CL 103 12/14/2017 1030   CO2 22 12/14/2017 1030   GLUCOSE 87 12/14/2017 1030   GLUCOSE 90 09/08/2017  1040   BUN 19 12/14/2017 1030   CREATININE 1.06 12/14/2017 1030   CREATININE 0.93 04/23/2016 1633   CALCIUM 9.4 12/14/2017 1030   GFRNONAA 70 12/14/2017 1030   GFRAA 81 12/14/2017 1030   Lab Results  Component Value Date   HGBA1C 5.7 (H) 12/14/2017   HGBA1C 5.6 03/08/2017   HGBA1C 5.5 11/18/2016   HGBA1C 5.5 03/05/2015   Lab Results  Component Value Date  INSULIN 21.3 12/14/2017   CBC    Component Value Date/Time   WBC 6.8 12/14/2017 1030   WBC 10.9 (H) 03/29/2017 0851   RBC 5.04 12/14/2017 1030   RBC 4.50 03/29/2017 0851   HGB 15.5 12/14/2017 1030   HCT 45.1 12/14/2017 1030   PLT 177 03/29/2017 0851   MCV 90 12/14/2017 1030   MCH 30.8 12/14/2017 1030   MCH 30.2 03/29/2017 0851   MCHC 34.4 12/14/2017 1030   MCHC 31.9 03/29/2017 0851   RDW 13.0 12/14/2017 1030   LYMPHSABS 1.7 12/14/2017 1030   MONOABS 0.5 03/16/2017 0839   EOSABS 0.1 12/14/2017 1030   BASOSABS 0.0 12/14/2017 1030   Iron/TIBC/Ferritin/ %Sat No results found for: IRON, TIBC, FERRITIN, IRONPCTSAT Lipid Panel     Component Value Date/Time   CHOL 150 12/14/2017 1030   TRIG 98 12/14/2017 1030   HDL 52 12/14/2017 1030   CHOLHDL 3 09/08/2017 1040   VLDL 28.4 09/08/2017 1040   LDLCALC 78 12/14/2017 1030   Hepatic Function Panel     Component Value Date/Time   PROT 6.5 12/14/2017 1030   ALBUMIN 4.5 12/14/2017 1030   AST 14 12/14/2017 1030   ALT 16 12/14/2017 1030   ALKPHOS 63 12/14/2017 1030   BILITOT 0.6 12/14/2017 1030   BILIDIR 0.2 06/07/2014 0909      Component Value Date/Time   TSH 1.060 12/14/2017 1030   TSH 0.62 10/03/2015 1230   TSH 0.51 10/08/2010 0859  Results for Karnik, Aston L "MIKE" (MRN [id number]) as of 02/14/2018 12:25  Ref. Range 12/14/2017 10:30  Vitamin D, 25-Hydroxy Latest Ref Range: 30.0 - 100.0 ng/mL 23.2 (L)    ASSESSMENT AND PLAN: Prediabetes - Plan: metFORMIN (GLUCOPHAGE) 500 MG tablet  Vitamin D deficiency - Plan: Vitamin D, Ergocalciferol, (DRISDOL) 1.25  MG (50000 UT) CAPS capsule  Class 3 severe obesity with serious comorbidity and body mass index (BMI) of 40.0 to 44.9 in adult, unspecified obesity type [facility])  PLAN:  Pre-Diabetes Eric [name] will continue to work on weight loss, exercise, and decreasing simple carbohydrates in his diet to help decrease the risk of diabetes. We dicussed metformin including benefits and risks. He was informed that eating too many simple carbohydrates or too many calories at one sitting increases the likelihood of GI side effects. Eric [name] agrees to continue taking metformin 500 mg q AM #30 and we will refill for 1 month. Eric [name] agrees to follow up with our clinic in 3 weeks as directed to monitor his progress.  Vitamin D Deficiency Eric [name] was informed that low vitamin D levels contributes to fatigue and are associated with obesity, breast, and colon cancer. Eric [name] agrees to continue taking prescription Vit D @50 ,000 IU every week #4 and we will refill for 1 month. He will follow up for routine testing of vitamin D, at least 2-3 times per year. He was informed of the risk of over-replacement of vitamin D and agrees to not increase his dose unless he discusses this with [place] first. Eric [name] agrees to follow up with our clinic in 3 weeks.  Obesity Eric [name] is currently in the action stage of change. As such, his goal is to continue with weight loss efforts He has agreed to follow the Category 3 plan Eric [name] has been instructed to work up to a goal of 150 minutes of combined cardio and strengthening exercise per week for weight loss and overall health benefits. We discussed the following Behavioral Modification Strategies today: increasing lean protein intake, decreasing simple carbohydrates ,  work on meal planning and easy cooking plans and holiday eating strategies    Eric [name] has agreed to follow up with our clinic in 3 weeks. He was informed of the importance of frequent follow up visits to maximize his success with  intensive lifestyle modifications for his multiple health conditions.   OBESITY BEHAVIORAL INTERVENTION VISIT  Today's visit was # 4   Starting weight: 288 lbs Starting date: 12/14/17 Today's weight : 264 lbs Today's date: 02/09/2018 Total lbs lost to date: 24 At least 15 minutes were spent on discussing the following behavioral intervention visit.   ASK: We discussed the diagnosis of obesity with Eric [name] today and Eric [name] agreed to give [place] permission to discuss obesity behavioral modification therapy today.  ASSESS: Eric [name] has the diagnosis of obesity and his BMI today is 40.15 Eric [name] is in the action stage of change   ADVISE: Eric [name] was educated on the multiple health risks of obesity as well as the benefit of weight loss to improve his health. He was advised of the need for long term treatment and the importance of lifestyle modifications to improve his current health and to decrease his risk of future health problems.  AGREE: Multiple dietary modification options and treatment options were discussed and  Eric [name] agreed to follow the recommendations documented in the above note.  ARRANGE: Eric [name] was educated on the importance of frequent visits to treat obesity as outlined per CMS and USPSTF guidelines and agreed to schedule his next follow up appointment today.  I, Trixie Dredge, am acting as transcriptionist for [name], MD  I have reviewed the above documentation for accuracy and completeness, and I agree with the above. -[name], MD

## 2018-02-23 ENCOUNTER — Encounter (INDEPENDENT_AMBULATORY_CARE_PROVIDER_SITE_OTHER): Payer: Self-pay | Admitting: Family Medicine

## 2018-02-27 ENCOUNTER — Encounter: Payer: Self-pay | Admitting: Internal Medicine

## 2018-03-02 ENCOUNTER — Ambulatory Visit (INDEPENDENT_AMBULATORY_CARE_PROVIDER_SITE_OTHER): Payer: Medicare Other | Admitting: Family Medicine

## 2018-03-02 ENCOUNTER — Encounter (INDEPENDENT_AMBULATORY_CARE_PROVIDER_SITE_OTHER): Payer: Self-pay | Admitting: Family Medicine

## 2018-03-02 VITALS — BP 125/84 | HR 57 | Temp 98.1°F | Ht 68.0 in | Wt 261.0 lb

## 2018-03-02 DIAGNOSIS — E559 Vitamin D deficiency, unspecified: Secondary | ICD-10-CM | POA: Diagnosis not present

## 2018-03-02 DIAGNOSIS — Z6839 Body mass index (BMI) 39.0-39.9, adult: Secondary | ICD-10-CM | POA: Diagnosis not present

## 2018-03-02 DIAGNOSIS — R7303 Prediabetes: Secondary | ICD-10-CM

## 2018-03-02 MED ORDER — METFORMIN HCL 500 MG PO TABS: 500.0000 mg | ORAL_TABLET | Freq: Every day | ORAL | 0 refills | Status: DC

## 2018-03-02 MED ORDER — VITAMIN D (ERGOCALCIFEROL) 1.25 MG (50000 UNIT) PO CAPS: 50000.0000 [IU] | ORAL_CAPSULE | ORAL | 0 refills | Status: DC

## 2018-03-06 NOTE — Progress Notes (Signed)
Office: [phone]  /  Fax: [phone]   HPI:   Chief Complaint: OBESITY Eric [name] is here to discuss his progress with his obesity treatment plan. He is on the Category 3 plan and is following his eating plan approximately 85 % of the time. He states he is walking around the neighborhood. Eric [name] continues to do well with weight loss even over Thanksgiving. He did well with portion control to celebration foods and notes hunger is controlled. He is getting some support from his wife.  His weight is 261 lb (118.4 kg) today and has had a weight loss of 3 pounds over a period of 3 weeks since his last visit. He has lost 27 lbs since starting treatment with [place].  Vitamin D deficiency Eric [name] has a diagnosis of vitamin D deficiency. He is stable taking vit D, but is not yet at goal. He will be getting labs done soon with Dr. Etter Sjogren. Husein denies nausea, vomiting, or muscle weakness.  Pre-Diabetes Eric [name] has a diagnosis of pre-diabetes based on his elevated Hgb A1c and was informed this puts him at greater risk of developing diabetes. He is stable on metformin and doing well with his diet prescription. He continues to work on diet and exercise to decrease risk of diabetes. He denies nausea, vomiting, or hypoglycemia.  ALLERGIES: Allergies  Allergen Reactions  . Lisinopril Cough    MEDICATIONS: Current Outpatient Medications on File Prior to Visit  Medication Sig Dispense Refill  . aspirin EC 81 MG tablet Take 81 mg by mouth daily.    [name] atorvastatin (LIPITOR) 40 MG tablet TAKE 1 TABLET DAILY 90 tablet 4  . Coenzyme Q10 (COQ10) 200 MG CAPS Take 200 mg by mouth at bedtime.    [name] loratadine (CLARITIN) 10 MG tablet Take 10 mg by mouth daily as needed for allergies.     . meloxicam (MOBIC) 15 MG tablet TAKE 1 TABLET DAILY 90 tablet 4  . Multiple Vitamin (MULTIVITAMIN WITH MINERALS) TABS tablet Take 1 tablet by mouth daily.    . NONFORMULARY OR COMPOUNDED ITEM apap cpap maching--auto  adjusting pressure  dx sleep apnea 1 each 0  . pantoprazole (PROTONIX) 40 MG tablet TAKE 1 TABLET DAILY 90 tablet 3  . tamsulosin (FLOMAX) 0.4 MG CAPS capsule Take 0.4 mg by mouth daily 90 capsule 3  . valsartan (DIOVAN) 80 MG tablet Take 1 tablet (80 mg total) by mouth daily. 90 tablet 3   No current facility-administered medications on file prior to visit.     PAST MEDICAL HISTORY: Past Medical History:  Diagnosis Date  . Alcohol abuse   . Back pain   . Colon polyps   . Diverticulosis of colon   . DJD (degenerative joint disease)   . Dysrhythmia    Palpations from caffiene  . Enlarged prostate   . GERD (gastroesophageal reflux disease)   . Hearing loss   . Hip osteoarthritis    Left  . History of hay fever   . Hx of cardiovascular stress test    Lexiscan Myoview (04/2013):  No ischemia, EF 61%; low risk  . Hyperlipemia   . Hypertension   . Internal hemorrhoids without mention of complication   . Leg edema   . Neuroma of foot    left  . Obesity   . Osteoarthrosis, unspecified whether generalized or localized, unspecified site   . Palpitations   . 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 Primary localized osteoarthrosis of right shoulder 05/25/2016  . Recent  weight loss    75lbs  . Seasonal allergies   . Sleep apnea    CPAP  . Thyroid nodule   . Tremor of both hands     PAST SURGICAL HISTORY: Past Surgical History:  Procedure Laterality Date  . COLONOSCOPY    . SHOULDER SURGERY  22-Jun-2007   right  . TONSILLECTOMY    . TOTAL HIP ARTHROPLASTY Left 03/28/2017  . TOTAL HIP ARTHROPLASTY Left 03/28/2017   Procedure: TOTAL HIP ARTHROPLASTY ANTERIOR APPROACH;  Surgeon: Frederik Pear, MD;  Location: [place];  Service: Orthopedics;  Laterality: Left;  . TOTAL SHOULDER ARTHROPLASTY Left 10/14/2015   Procedure: LEFT TOTAL SHOULDER ARTHROPLASTY;  Surgeon: Marchia Bond, MD;  Location: [place];  Service: Orthopedics;  Laterality: Left;  . TOTAL SHOULDER ARTHROPLASTY Right  05/25/2016   Procedure: TOTAL SHOULDER ARTHROPLASTY;  Surgeon: Marchia Bond, MD;  Location: [place];  Service: Orthopedics;  Laterality: Right;    SOCIAL HISTORY: Social History   Tobacco Use  . Smoking status: Never Smoker  . Smokeless tobacco: Never Used  Substance Use Topics  . Alcohol use: Yes    Alcohol/week: 0.0 standard drinks    Comment: rare----12 a year  . Drug use: No    FAMILY HISTORY: Family History  Problem Relation Age of Onset  . Arthritis Mother   . Cancer Mother 96       breast  . Hyperlipidemia Mother   . Colon polyps Mother 12  . Hypertension Mother   . Heart disease Mother   . Depression Mother   . Anxiety disorder Mother   . Obesity Mother   . Heart disease Father        CAD--passed away 2011-06-22 age 73  . Hyperlipidemia Father   . Hypertension Father   . Obesity Father   . Diabetes Father   . Depression Father   . Sleep apnea Father   . Cancer Sister 31       breast  . Cancer Brother 55       prostate  . Breast cancer Unknown   . Prostate cancer Unknown   . Colon cancer Neg Hx    ROS: Review of Systems  Gastrointestinal: Negative for nausea and vomiting.  Musculoskeletal:       Negative for muscle weakness.  Endo/Heme/Allergies:       Negative for hypoglycemia.   PHYSICAL EXAM: Blood pressure 125/84, pulse (!) 57, temperature 98.1 F (36.7 C), temperature source Oral, height 5\' 8"  (1.727 m), weight 261 lb (118.4 kg), SpO2 97 %. Body mass index is 39.68 kg/m. Physical Exam Vitals signs reviewed.  Constitutional:      Appearance: Normal appearance. He is obese.  Cardiovascular:     Rate and Rhythm: Normal rate.  Pulmonary:     Effort: Pulmonary effort is normal.  Musculoskeletal: Normal range of motion.  Skin:    General: Skin is warm and dry.  Neurological:     Mental Status: He is alert and oriented to person, place, and time.  Psychiatric:        Mood and Affect: Mood normal.        Behavior: Behavior normal.    RECENT LABS  AND TESTS: BMET    Component Value Date/Time   NA 141 12/14/2017 1030   K 4.5 12/14/2017 1030   CL 103 12/14/2017 1030   CO2 22 12/14/2017 1030   GLUCOSE 87 12/14/2017 1030   GLUCOSE 90 09/08/2017 1040   BUN 19 12/14/2017 1030   CREATININE  1.06 12/14/2017 1030   CREATININE 0.93 04/23/2016 1633   CALCIUM 9.4 12/14/2017 1030   GFRNONAA 70 12/14/2017 1030   GFRAA 81 12/14/2017 1030   Lab Results  Component Value Date   HGBA1C 5.7 (H) 12/14/2017   HGBA1C 5.6 03/08/2017   HGBA1C 5.5 11/18/2016   HGBA1C 5.5 03/05/2015   Lab Results  Component Value Date   INSULIN 21.3 12/14/2017   CBC    Component Value Date/Time   WBC 6.8 12/14/2017 1030   WBC 10.9 (H) 03/29/2017 0851   RBC 5.04 12/14/2017 1030   RBC 4.50 03/29/2017 0851   HGB 15.5 12/14/2017 1030   HCT 45.1 12/14/2017 1030   PLT 177 03/29/2017 0851   MCV 90 12/14/2017 1030   MCH 30.8 12/14/2017 1030   MCH 30.2 03/29/2017 0851   MCHC 34.4 12/14/2017 1030   MCHC 31.9 03/29/2017 0851   RDW 13.0 12/14/2017 1030   LYMPHSABS 1.7 12/14/2017 1030   MONOABS 0.5 03/16/2017 0839   EOSABS 0.1 12/14/2017 1030   BASOSABS 0.0 12/14/2017 1030   Iron/TIBC/Ferritin/ %Sat No results found for: IRON, TIBC, FERRITIN, IRONPCTSAT Lipid Panel     Component Value Date/Time   CHOL 150 12/14/2017 1030   TRIG 98 12/14/2017 1030   HDL 52 12/14/2017 1030   CHOLHDL 3 09/08/2017 1040   VLDL 28.4 09/08/2017 1040   LDLCALC 78 12/14/2017 1030   Hepatic Function Panel     Component Value Date/Time   PROT 6.5 12/14/2017 1030   ALBUMIN 4.5 12/14/2017 1030   AST 14 12/14/2017 1030   ALT 16 12/14/2017 1030   ALKPHOS 63 12/14/2017 1030   BILITOT 0.6 12/14/2017 1030   BILIDIR 0.2 06/07/2014 0909      Component Value Date/Time   TSH 1.060 12/14/2017 1030   TSH 0.62 10/03/2015 1230   TSH 0.51 10/08/2010 0859   Results for Eric [name], Kay L "MIKE" (MRN [id number]) as of 03/06/2018 09:35  Ref. Range 12/14/2017 10:30  Vitamin D,  25-Hydroxy Latest Ref Range: 30.0 - 100.0 ng/mL 23.2 (L)   ASSESSMENT AND PLAN: Vitamin D deficiency - Plan: Vitamin D, Ergocalciferol, (DRISDOL) 1.25 MG (50000 UT) CAPS capsule  Prediabetes - Plan: metFORMIN (GLUCOPHAGE) 500 MG tablet  Class 2 severe obesity with serious comorbidity and body mass index (BMI) of 39.0 to 39.9 in adult, unspecified obesity type [facility])   Results for Eric [name], General L "MIKE" (MRN [id number]) as of 03/06/2018 09:35  Ref. Range 12/14/2017 10:30  Vitamin D, 25-Hydroxy Latest Ref Range: 30.0 - 100.0 ng/mL 23.2 (L)   PLAN:  Vitamin D Deficiency Eric [name] was informed that low vitamin D levels contributes to fatigue and are associated with obesity, breast, and colon cancer. He agrees to continue to take prescription Vit D @50 ,000 IU every week #4 with no refills and will follow up for routine testing of vitamin D, at least 2-3 times per year. He was informed of the risk of over-replacement of vitamin D and agrees to not increase his dose unless he discusses this with [place] first. Eric [name] agrees to follow up in 4 weeks.  Pre-Diabetes Eric [name] will continue to work on weight loss, exercise, and decreasing simple carbohydrates in his diet to help decrease the risk of diabetes. He was informed that eating too many simple carbohydrates or too many calories at one sitting increases the likelihood of GI side effects. Eric [name] agreed to continue taking metformin 500mg  qAM #30 with no refills and a prescription was written today. Eric [name] agreed to follow up with  [place] as directed to monitor his progress.  Obesity Eric [name] is currently in the action stage of change. As such, his goal is to continue with weight loss efforts. He has agreed to follow the Category 3 plan. Eric [name] has been instructed to work up to a goal of 150 minutes of combined cardio and strengthening exercise per week for weight loss and overall health benefits. We discussed the following Behavioral Modification  Strategies today: increasing lean protein intake, decreasing simple carbohydrates, work on meal planning and easy cooking plans, holiday eating strategies, and celebration eating strategies.  Eric [name] has agreed to follow up with our clinic in 4 weeks. He was informed of the importance of frequent follow up visits to maximize his success with intensive lifestyle modifications for his multiple health conditions.   OBESITY BEHAVIORAL INTERVENTION VISIT  Today's visit was # 5   Starting weight: 288 lbs Starting date: 12/14/17 Today's weight : Weight: 261 lb (118.4 kg)  Today's date: 03/02/2018 Total lbs lost to date: 27 At least 15 minutes were spent on discussing the following behavioral intervention visit.  ASK: We discussed the diagnosis of obesity with Eric [name] today and Eric [name] agreed to give [place] permission to discuss obesity behavioral modification therapy today.  ASSESS: Eric [name] has the diagnosis of obesity and his BMI today is 39.6. Eric [name] is in the action stage of change.   ADVISE: Eric [name] was educated on the multiple health risks of obesity as well as the benefit of weight loss to improve his health. He was advised of the need for long term treatment and the importance of lifestyle modifications to improve his current health and to decrease his risk of future health problems.  AGREE: Multiple dietary modification options and treatment options were discussed and Eric [name] agreed to follow the recommendations documented in the above note.  ARRANGE: Eric [name] was educated on the importance of frequent visits to treat obesity as outlined per CMS and USPSTF guidelines and agreed to schedule his next follow up appointment today.  I, Marcille Blanco, am acting as transcriptionist for Starlyn Skeans, MD  I have reviewed the above documentation for accuracy and completeness, and I agree with the above. -[name], MD

## 2018-03-09 ENCOUNTER — Encounter: Payer: Self-pay | Admitting: Family Medicine

## 2018-03-09 ENCOUNTER — Ambulatory Visit (INDEPENDENT_AMBULATORY_CARE_PROVIDER_SITE_OTHER): Payer: Medicare Other | Admitting: Family Medicine

## 2018-03-09 VITALS — BP 108/60 | HR 50 | Temp 98.0°F | Resp 16 | Ht 68.0 in | Wt 264.6 lb

## 2018-03-09 DIAGNOSIS — E8881 Metabolic syndrome: Secondary | ICD-10-CM | POA: Diagnosis not present

## 2018-03-09 DIAGNOSIS — R972 Elevated prostate specific antigen [PSA]: Secondary | ICD-10-CM | POA: Diagnosis not present

## 2018-03-09 DIAGNOSIS — E559 Vitamin D deficiency, unspecified: Secondary | ICD-10-CM

## 2018-03-09 DIAGNOSIS — E785 Hyperlipidemia, unspecified: Secondary | ICD-10-CM

## 2018-03-09 DIAGNOSIS — R739 Hyperglycemia, unspecified: Secondary | ICD-10-CM

## 2018-03-09 DIAGNOSIS — I10 Essential (primary) hypertension: Secondary | ICD-10-CM | POA: Diagnosis not present

## 2018-03-09 LAB — COMPREHENSIVE METABOLIC PANEL
ALT: 13 U/L (ref 0–53)
AST: 12 U/L (ref 0–37)
Albumin: 4.3 g/dL (ref 3.5–5.2)
Alkaline Phosphatase: 58 U/L (ref 39–117)
BUN: 26 mg/dL — ABNORMAL HIGH (ref 6–23)
CO2: 27 mEq/L (ref 19–32)
Calcium: 9.4 mg/dL (ref 8.4–10.5)
Chloride: 105 mEq/L (ref 96–112)
Creatinine, Ser: 1.06 mg/dL (ref 0.40–1.50)
GFR: 73.02 mL/min (ref >60.00)
Glucose, Bld: 97 mg/dL (ref 70–99)
Potassium: 4.6 mEq/L (ref 3.5–5.1)
Sodium: 141 mEq/L (ref 135–145)
Total Bilirubin: 0.7 mg/dL (ref 0.2–1.2)
Total Protein: 6.6 g/dL (ref 6.0–8.3)

## 2018-03-09 LAB — LIPID PANEL
Cholesterol: 115 mg/dL (ref 0–200)
HDL: 36.3 mg/dL — ABNORMAL LOW (ref >39.00)
LDL Cholesterol: 62 mg/dL (ref 0–99)
NonHDL: 79.07
Total CHOL/HDL Ratio: 3
Triglycerides: 85 mg/dL (ref 0.0–149.0)
VLDL: 17 mg/dL (ref 0.0–40.0)

## 2018-03-09 LAB — VITAMIN D 25 HYDROXY (VIT D DEFICIENCY, FRACTURES): VITD: 46.86 ng/mL (ref 30.00–100.00)

## 2018-03-09 LAB — HEMOGLOBIN A1C: Hgb A1c MFr Bld: 5.4 % (ref 4.6–6.5)

## 2018-03-09 LAB — PSA: PSA: 1.08 ng/mL (ref 0.10–4.00)

## 2018-03-09 NOTE — Assessment & Plan Note (Signed)
Well controlled, no changes to meds. Encouraged heart healthy diet such as the DASH diet and exercise as tolerated.  °

## 2018-03-09 NOTE — Patient Instructions (Signed)
DASH Eating Plan  DASH stands for "Dietary Approaches to Stop Hypertension." The DASH eating plan is a healthy eating plan that has been shown to reduce high blood pressure (hypertension). It may also reduce your risk for type 2 diabetes, heart disease, and stroke. The DASH eating plan may also help with weight loss.  What are tips for following this plan?    General guidelines   Avoid eating more than 2,300 mg (milligrams) of salt (sodium) a day. If you have hypertension, you may need to reduce your sodium intake to 1,500 mg a day.   Limit alcohol intake to no more than 1 drink a day for nonpregnant women and 2 drinks a day for men. One drink equals 12 oz of beer, 5 oz of wine, or 1 oz of hard liquor.   Work with your health care provider to maintain a healthy body weight or to lose weight. Ask what an ideal weight is for you.   Get at least 30 minutes of exercise that causes your heart to beat faster (aerobic exercise) most days of the week. Activities may include walking, swimming, or biking.   Work with your health care provider or diet and nutrition specialist (dietitian) to adjust your eating plan to your individual calorie needs.  Reading food labels     Check food labels for the amount of sodium per serving. Choose foods with less than 5 percent of the Daily Value of sodium. Generally, foods with less than 300 mg of sodium per serving fit into this eating plan.   To find whole grains, look for the word "whole" as the first word in the ingredient list.  Shopping   Buy products labeled as "low-sodium" or "no salt added."   Buy fresh foods. Avoid canned foods and premade or frozen meals.  Cooking   Avoid adding salt when cooking. Use salt-free seasonings or herbs instead of table salt or sea salt. Check with your health care provider or pharmacist before using salt substitutes.   Do not fry foods. Cook foods using healthy methods such as baking, boiling, grilling, and broiling instead.   Cook with  heart-healthy oils, such as olive, canola, soybean, or sunflower oil.  Meal planning   Eat a balanced diet that includes:  ? 5 or more servings of fruits and vegetables each day. At each meal, try to fill half of your plate with fruits and vegetables.  ? Up to 6-8 servings of whole grains each day.  ? Less than 6 oz of lean meat, poultry, or fish each day. A 3-oz serving of meat is about the same size as a deck of cards. One egg equals 1 oz.  ? 2 servings of low-fat dairy each day.  ? A serving of nuts, seeds, or beans 5 times each week.  ? Heart-healthy fats. Healthy fats called Omega-3 fatty acids are found in foods such as flaxseeds and coldwater fish, like sardines, salmon, and mackerel.   Limit how much you eat of the following:  ? Canned or prepackaged foods.  ? Food that is high in trans fat, such as fried foods.  ? Food that is high in saturated fat, such as fatty meat.  ? Sweets, desserts, sugary drinks, and other foods with added sugar.  ? Full-fat dairy products.   Do not salt foods before eating.   Try to eat at least 2 vegetarian meals each week.   Eat more home-cooked food and less restaurant, buffet, and fast food.     When eating at a restaurant, ask that your food be prepared with less salt or no salt, if possible.  What foods are recommended?  The items listed may not be a complete list. Talk with your dietitian about what dietary choices are best for you.  Grains  Whole-grain or whole-wheat bread. Whole-grain or whole-wheat pasta. Brown rice. Oatmeal. Quinoa. Bulgur. Whole-grain and low-sodium cereals. Pita bread. Low-fat, low-sodium crackers. Whole-wheat flour tortillas.  Vegetables  Fresh or frozen vegetables (raw, steamed, roasted, or grilled). Low-sodium or reduced-sodium tomato and vegetable juice. Low-sodium or reduced-sodium tomato sauce and tomato paste. Low-sodium or reduced-sodium canned vegetables.  Fruits  All fresh, dried, or frozen fruit. Canned fruit in natural juice (without  added sugar).  Meat and other protein foods  Skinless chicken or turkey. Ground chicken or turkey. Pork with fat trimmed off. Fish and seafood. Egg whites. Dried beans, peas, or lentils. Unsalted nuts, nut butters, and seeds. Unsalted canned beans. Lean cuts of beef with fat trimmed off. Low-sodium, lean deli meat.  Dairy  Low-fat (1%) or fat-free (skim) milk. Fat-free, low-fat, or reduced-fat cheeses. Nonfat, low-sodium ricotta or cottage cheese. Low-fat or nonfat yogurt. Low-fat, low-sodium cheese.  Fats and oils  Soft margarine without trans fats. Vegetable oil. Low-fat, reduced-fat, or light mayonnaise and salad dressings (reduced-sodium). Canola, safflower, olive, soybean, and sunflower oils. Avocado.  Seasoning and other foods  Herbs. Spices. Seasoning mixes without salt. Unsalted popcorn and pretzels. Fat-free sweets.  What foods are not recommended?  The items listed may not be a complete list. Talk with your dietitian about what dietary choices are best for you.  Grains  Baked goods made with fat, such as croissants, muffins, or some breads. Dry pasta or rice meal packs.  Vegetables  Creamed or fried vegetables. Vegetables in a cheese sauce. Regular canned vegetables (not low-sodium or reduced-sodium). Regular canned tomato sauce and paste (not low-sodium or reduced-sodium). Regular tomato and vegetable juice (not low-sodium or reduced-sodium). Pickles. Olives.  Fruits  Canned fruit in a light or heavy syrup. Fried fruit. Fruit in cream or butter sauce.  Meat and other protein foods  Fatty cuts of meat. Ribs. Fried meat. Bacon. Sausage. Bologna and other processed lunch meats. Salami. Fatback. Hotdogs. Bratwurst. Salted nuts and seeds. Canned beans with added salt. Canned or smoked fish. Whole eggs or egg yolks. Chicken or turkey with skin.  Dairy  Whole or 2% milk, cream, and half-and-half. Whole or full-fat cream cheese. Whole-fat or sweetened yogurt. Full-fat cheese. Nondairy creamers. Whipped toppings.  Processed cheese and cheese spreads.  Fats and oils  Butter. Stick margarine. Lard. Shortening. Ghee. Bacon fat. Tropical oils, such as coconut, palm kernel, or palm oil.  Seasoning and other foods  Salted popcorn and pretzels. Onion salt, garlic salt, seasoned salt, table salt, and sea salt. Worcestershire sauce. Tartar sauce. Barbecue sauce. Teriyaki sauce. Soy sauce, including reduced-sodium. Steak sauce. Canned and packaged gravies. Fish sauce. Oyster sauce. Cocktail sauce. Horseradish that you find on the shelf. Ketchup. Mustard. Meat flavorings and tenderizers. Bouillon cubes. Hot sauce and Tabasco sauce. Premade or packaged marinades. Premade or packaged taco seasonings. Relishes. Regular salad dressings.  Where to find more information:   National Heart, Lung, and Blood Institute: www.nhlbi.nih.gov   American Heart Association: www.heart.org  Summary   The DASH eating plan is a healthy eating plan that has been shown to reduce high blood pressure (hypertension). It may also reduce your risk for type 2 diabetes, heart disease, and stroke.   With the   DASH eating plan, you should limit salt (sodium) intake to 2,300 mg a day. If you have hypertension, you may need to reduce your sodium intake to 1,500 mg a day.   When on the DASH eating plan, aim to eat more fresh fruits and vegetables, whole grains, lean proteins, low-fat dairy, and heart-healthy fats.   Work with your health care provider or diet and nutrition specialist (dietitian) to adjust your eating plan to your individual calorie needs.  This information is not intended to replace advice given to you by your health care provider. Make sure you discuss any questions you have with your health care provider.  Document Released: 02/25/2011 Document Revised: 03/01/2016 Document Reviewed: 03/01/2016  Elsevier Interactive Patient Education  2019 [employer] Inc[employer]

## 2018-03-09 NOTE — Progress Notes (Signed)
Patient ID: KYLIE GROS, male    DOB: [birth year]  Age: 72 y.o. MRN: [id number]    Subjective:  Subjective  HPI ALANDO COLLERAN presents for f/u bp and cholesterol     No complaints .  Review of Systems  Constitutional: Negative for appetite change, diaphoresis, fatigue and unexpected weight change.  Eyes: Negative for pain, redness and visual disturbance.  Respiratory: Negative for cough, chest tightness, shortness of breath and wheezing.   Cardiovascular: Negative for chest pain, palpitations and leg swelling.  Endocrine: Negative for cold intolerance, heat intolerance, polydipsia, polyphagia and polyuria.  Genitourinary: Negative for difficulty urinating, dysuria and frequency.  Neurological: Negative for dizziness, light-headedness, numbness and headaches.    History Past Medical History:  Diagnosis Date  . Alcohol abuse   . Back pain   . Colon polyps   . Diverticulosis of colon   . DJD (degenerative joint disease)   . Dysrhythmia    Palpations from caffiene  . Enlarged prostate   . GERD (gastroesophageal reflux disease)   . Hearing loss   . Hip osteoarthritis    Left  . History of hay fever   . Hx of cardiovascular stress test    Lexiscan Myoview (04/2013):  No ischemia, EF 61%; low risk  . Hyperlipemia   . Hypertension   . Internal hemorrhoids without mention of complication   . Leg edema   . Neuroma of foot    left  . Obesity   . Osteoarthrosis, unspecified whether generalized or localized, unspecified site   . Palpitations   . 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 Primary localized osteoarthrosis of right shoulder 05/25/2016  . Recent weight loss    75lbs  . Seasonal allergies   . Sleep apnea    CPAP  . Thyroid nodule   . Tremor of both hands     He has a past surgical history that includes Shoulder surgery (2009); Tonsillectomy; Colonoscopy; Total shoulder arthroplasty (Left, 10/14/2015); Total shoulder arthroplasty (Right,  05/25/2016); Total hip arthroplasty (Left, 03/28/2017); and Total hip arthroplasty (Left, 03/28/2017).   His family history includes Anxiety disorder in his mother; Arthritis in his mother; Breast cancer in his unknown relative; Cancer (age of onset: 59) in his sister; Cancer (age of onset: 74) in his mother; Cancer (age of onset: 57) in his brother; Colon polyps (age of onset: 28) in his mother; Depression in his father and mother; Diabetes in his father; Heart disease in his father and mother; Hyperlipidemia in his father and mother; Hypertension in his father and mother; Obesity in his father and mother; Prostate cancer in his unknown relative; Sleep apnea in his father.He reports that he has never smoked. He has never used smokeless tobacco. He reports current alcohol use. He reports that he does not use drugs.  Current Outpatient Medications on File Prior to Visit  Medication Sig Dispense Refill  . aspirin EC 81 MG tablet Take 81 mg by mouth daily.    [name] atorvastatin (LIPITOR) 40 MG tablet TAKE 1 TABLET DAILY 90 tablet 4  . Coenzyme Q10 (COQ10) 200 MG CAPS Take 200 mg by mouth at bedtime.    [name] loratadine (CLARITIN) 10 MG tablet Take 10 mg by mouth daily as needed for allergies.     . meloxicam (MOBIC) 15 MG tablet TAKE 1 TABLET DAILY 90 tablet 4  . metFORMIN (GLUCOPHAGE) 500 MG tablet Take 1 tablet (500 mg total) by mouth daily with breakfast. 30 tablet 0  . Multiple  Vitamin (MULTIVITAMIN WITH MINERALS) TABS tablet Take 1 tablet by mouth daily.    . NONFORMULARY OR COMPOUNDED ITEM apap cpap maching--auto adjusting pressure  dx sleep apnea 1 each 0  . pantoprazole (PROTONIX) 40 MG tablet TAKE 1 TABLET DAILY 90 tablet 3  . tamsulosin (FLOMAX) 0.4 MG CAPS capsule Take 0.4 mg by mouth daily 90 capsule 3  . valsartan (DIOVAN) 80 MG tablet Take 1 tablet (80 mg total) by mouth daily. 90 tablet 3  . Vitamin D, Ergocalciferol, (DRISDOL) 1.25 MG (50000 UT) CAPS capsule Take 1 capsule (50,000 Units total) by  mouth every 7 (seven) days. 4 capsule 0   No current facility-administered medications on file prior to visit.      Objective:  Objective  Physical Exam Vitals signs and nursing note reviewed.  Constitutional:      General: He is sleeping.     Appearance: He is well-developed.  HENT:     Head: Normocephalic and atraumatic.  Eyes:     Pupils: Pupils are equal, round, and reactive to light.  Neck:     Musculoskeletal: Normal range of motion and neck supple.     Thyroid: No thyromegaly.  Cardiovascular:     Rate and Rhythm: Normal rate and regular rhythm.     Heart sounds: No murmur.  Pulmonary:     Effort: Pulmonary effort is normal. No respiratory distress.     Breath sounds: Normal breath sounds. No wheezing or rales.  Chest:     Chest wall: No tenderness.  Musculoskeletal:        General: No tenderness.  Skin:    General: Skin is warm and dry.  Neurological:     Mental Status: He is oriented to person, place, and time.  Psychiatric:        Behavior: Behavior normal.        Thought Content: Thought content normal.        Judgment: Judgment normal.    BP 108/60 (BP Location: Left Arm, Cuff Size: Large)   Pulse (!) 50   Temp 98 F (36.7 C) (Oral)   Resp 16   Ht 5\' 8"  (1.727 m)   Wt 264 lb 9.6 oz (120 kg)   SpO2 96%   BMI 40.23 kg/m  Wt Readings from Last 3 Encounters:  03/09/18 264 lb 9.6 oz (120 kg)  03/02/18 261 lb (118.4 kg)  02/09/18 264 lb (119.7 kg)     Lab Results  Component Value Date   WBC 6.8 12/14/2017   HGB 15.5 12/14/2017   HCT 45.1 12/14/2017   PLT 177 03/29/2017   GLUCOSE 87 12/14/2017   CHOL 150 12/14/2017   TRIG 98 12/14/2017   HDL 52 12/14/2017   LDLCALC 78 12/14/2017   ALT 16 12/14/2017   AST 14 12/14/2017   NA 141 12/14/2017   K 4.5 12/14/2017   CL 103 12/14/2017   CREATININE 1.06 12/14/2017   BUN 19 12/14/2017   CO2 22 12/14/2017   TSH 1.060 12/14/2017   PSA 1.12 09/08/2017   INR 0.96 03/16/2017   HGBA1C 5.7 (H)  12/14/2017   MICROALBUR 1.5 12/25/2012    Dg Chest 2 View  Result Date: 03/16/2017 CLINICAL DATA:  Preoperative examination prior to anterior approach hip arthroplasty. EXAM: CHEST  2 VIEW COMPARISON:  No recent studies in [facility] FINDINGS: The lungs are well-expanded and clear. The heart and pulmonary vascularity are normal. The mediastinum is normal in width. The trachea is midline. There is no pleural  effusion. There are prosthetic shoulder joints bilaterally. IMPRESSION: There is no active cardiopulmonary disease. Electronically Signed   By: David  Martinique M.D.   On: 03/16/2017 09:07     Assessment & Plan:  Plan  I am having Harvel L. Pennwyn "Ronalee Belts" maintain his loratadine, NONFORMULARY OR COMPOUNDED ITEM, multivitamin with minerals, CoQ10, valsartan, tamsulosin, pantoprazole, meloxicam, aspirin EC, atorvastatin, Vitamin D (Ergocalciferol), and metFORMIN.  No orders of the defined types were placed in this encounter.   Problem List Items Addressed This Visit      Unprioritized   Essential hypertension    Well controlled, no changes to meds. Encouraged heart healthy diet such as the DASH diet and exercise as tolerated.       Relevant Orders   Comprehensive metabolic panel   Hyperlipidemia - Primary   Relevant Orders   Lipid panel   Comprehensive metabolic panel   Hyperlipidemia LDL goal <100    Tolerating statin, encouraged heart healthy diet, avoid trans fats, minimize simple carbs and saturated fats. Increase exercise as tolerated       Other Visit Diagnoses    Vitamin D deficiency       Relevant Orders   Comprehensive metabolic panel   Vitamin D (25 hydroxy)   Insulin resistance       Relevant Orders   Insulin, random   Hemoglobin A1c   Elevated PSA       Relevant Orders   PSA   Hyperglycemia       Relevant Orders   Hemoglobin A1c      Follow-up: No follow-ups on file.  [name], DO

## 2018-03-09 NOTE — Assessment & Plan Note (Signed)
Tolerating statin, encouraged heart healthy diet, avoid trans fats, minimize simple carbs and saturated fats. Increase exercise as tolerated 

## 2018-03-10 LAB — INSULIN, RANDOM: Insulin: 14.4 u[IU]/mL (ref 2.0–19.6)

## 2018-03-23 ENCOUNTER — Ambulatory Visit (AMBULATORY_SURGERY_CENTER): Payer: Self-pay | Admitting: *Deleted

## 2018-03-23 VITALS — Ht 68.0 in | Wt 264.0 lb

## 2018-03-23 DIAGNOSIS — Z86010 Personal history of colonic polyps: Secondary | ICD-10-CM

## 2018-03-23 MED ORDER — PEG-KCL-NACL-NASULF-NA ASC-C 140 G PO SOLR: 1.0000 | ORAL | 0 refills | Status: DC

## 2018-03-23 NOTE — Progress Notes (Signed)
No egg or soy allergy known to patient  No issues with past sedation with any surgeries  or procedures, no intubation problems  No diet pills per patient No home 02 use per patient  No blood thinners per patient  Pt denies issues with constipation except on diet or pain meds for surgery  not chronic at all  No A fib or A flutter  EMMI video sent to pt's e mail -- pt declined  Plenvu sample- Lot [id number]  Exp 09/2018 as directed

## 2018-03-24 ENCOUNTER — Telehealth: Payer: Self-pay | Admitting: Internal Medicine

## 2018-03-24 NOTE — Telephone Encounter (Signed)
Pt was seen for pre opt 03/23/18.  Pt has questions regarding his antibiotics before procedure.  Please CB.

## 2018-03-24 NOTE — Telephone Encounter (Signed)
Pt asking if he needs an antibiotic before his colon as he has joint replacements- Informed colon is low risk, and no longer requires antibiotics pre colon. Pt verbalized understanding

## 2018-04-03 ENCOUNTER — Ambulatory Visit (AMBULATORY_SURGERY_CENTER): Payer: Medicare Other | Admitting: Internal Medicine

## 2018-04-03 ENCOUNTER — Encounter: Payer: Self-pay | Admitting: Family Medicine

## 2018-04-03 ENCOUNTER — Encounter: Payer: Self-pay | Admitting: Internal Medicine

## 2018-04-03 VITALS — BP 126/68 | HR 64 | Temp 97.5°F | Resp 14 | Ht 68.0 in | Wt 264.0 lb

## 2018-04-03 DIAGNOSIS — Z86010 Personal history of colonic polyps: Secondary | ICD-10-CM

## 2018-04-03 DIAGNOSIS — R002 Palpitations: Secondary | ICD-10-CM | POA: Diagnosis not present

## 2018-04-03 DIAGNOSIS — I10 Essential (primary) hypertension: Secondary | ICD-10-CM | POA: Diagnosis not present

## 2018-04-03 DIAGNOSIS — G4733 Obstructive sleep apnea (adult) (pediatric): Secondary | ICD-10-CM | POA: Diagnosis not present

## 2018-04-03 DIAGNOSIS — E669 Obesity, unspecified: Secondary | ICD-10-CM | POA: Diagnosis not present

## 2018-04-03 MED ORDER — SODIUM CHLORIDE 0.9 % IV SOLN: 500.0000 mL | Freq: Once | INTRAVENOUS | Status: DC

## 2018-04-03 NOTE — Op Note (Signed)
[place] Patient Name: Eric [name] Procedure Date: 04/03/2018 8:01 AM MRN: [id number] Endoscopist: Docia Chuck. Henrene Pastor , MD Age: 73 Referring MD:  Date of Birth: May 08, 1945 Gender: Male Account #: [id number] Procedure:                Colonoscopy Indications:              High risk colon cancer surveillance: Personal                            history of multiple (3 or more) adenomas. Previous                            examinations elsewhere and here 2006, 2009, 2014 Medicines:                Monitored Anesthesia Care Procedure:                Pre-Anesthesia Assessment:                           - Prior to the procedure, a History and Physical                            was performed, and patient medications and                            allergies were reviewed. The patient's tolerance of                            previous anesthesia was also reviewed. The risks                            and benefits of the procedure and the sedation                            options and risks were discussed with the patient.                            All questions were answered, and informed consent                            was obtained. Prior Anticoagulants: The patient has                            taken no previous anticoagulant or antiplatelet                            agents. ASA Grade Assessment: II - A patient with                            mild systemic disease. After reviewing the risks                            and benefits, the patient was deemed in  satisfactory condition to undergo the procedure.                           After obtaining informed consent, the colonoscope                            was passed under direct vision. Throughout the                            procedure, the patient's blood pressure, pulse, and                            oxygen saturations were monitored continuously. The                            Colonoscope was  introduced through the anus and                            advanced to the the cecum, identified by                            appendiceal orifice and ileocecal valve. The                            ileocecal valve, appendiceal orifice, and rectum                            were photographed. The quality of the bowel                            preparation was good. The colonoscopy was performed                            without difficulty. The patient tolerated the                            procedure well. The bowel preparation used was                            SUPREP. Scope In: 8:16:07 AM Scope Out: 8:32:48 AM Scope Withdrawal Time: 0 hours 12 minutes 9 seconds  Total Procedure Duration: 0 hours 16 minutes 41 seconds  Findings:                 Multiple diverticula were found in the left colon.                           Internal hemorrhoids were found during retroflexion.                           The exam was otherwise without abnormality on                            direct and retroflexion views. Complications:            No immediate  complications. Estimated blood loss:                            None. Estimated Blood Loss:     Estimated blood loss: none. Impression:               - Diverticulosis in the left colon.                           - Internal hemorrhoids.                           - The examination was otherwise normal on direct                            and retroflexion views.                           - No specimens collected. Recommendation:           - Repeat colonoscopy in 5 years for surveillance.                           - Patient has a contact number available for                            emergencies. The signs and symptoms of potential                            delayed complications were discussed with the                            patient. Return to normal activities tomorrow.                            Written discharge instructions were provided to  the                            patient.                           - Resume previous diet.                           - Continue present medications. Docia Chuck. Henrene Pastor, MD 04/03/2018 8:39:09 AM This report has been signed electronically.

## 2018-04-03 NOTE — Progress Notes (Signed)
Pt's states no medical or surgical changes since previsit or office visit. 

## 2018-04-03 NOTE — Patient Instructions (Signed)
Impression/Recommendations:  Diverticulosis handout given to patient. Hemorrhoid handout given to patient.  Repeat colonoscopy in 5 years for surveillance.  Resume previous diet. Continue present medications.  YOU HAD AN ENDOSCOPIC PROCEDURE TODAY AT [facility]:   Refer to the procedure report that was given to you for any specific questions about what was found during the examination.  If the procedure report does not answer your questions, please call your gastroenterologist to clarify.  If you requested that your care partner not be given the details of your procedure findings, then the procedure report has been included in a sealed envelope for you to review at your convenience later.  YOU SHOULD EXPECT: Some feelings of bloating in the abdomen. Passage of more gas than usual.  Walking can help get rid of the air that was put into your GI tract during the procedure and reduce the bloating. If you had a lower endoscopy (such as a colonoscopy or flexible sigmoidoscopy) you may notice spotting of blood in your stool or on the toilet paper. If you underwent a bowel prep for your procedure, you may not have a normal bowel movement for a few days.  Please Note:  You might notice some irritation and congestion in your nose or some drainage.  This is from the oxygen used during your procedure.  There is no need for concern and it should clear up in a day or so.  SYMPTOMS TO REPORT IMMEDIATELY:   Following lower endoscopy (colonoscopy or flexible sigmoidoscopy):  Excessive amounts of blood in the stool  Significant tenderness or worsening of abdominal pains  Swelling of the abdomen that is new, acute  Fever of 100F or higher  For urgent or emergent issues, a gastroenterologist can be reached at any hour by calling [phone].   DIET:  We do recommend a small meal at first, but then you may proceed to your regular diet.  Drink plenty of fluids but you should avoid  alcoholic beverages for 24 hours.  ACTIVITY:  You should plan to take it easy for the rest of today and you should NOT DRIVE or use heavy machinery until tomorrow (because of the sedation medicines used during the test).    FOLLOW UP: Our staff will call the number listed on your records the next business day following your procedure to check on you and address any questions or concerns that you may have regarding the information given to you following your procedure. If we do not reach you, we will leave a message.  However, if you are feeling well and you are not experiencing any problems, there is no need to return our call.  We will assume that you have returned to your regular daily activities without incident.  If any biopsies were taken you will be contacted by phone or by letter within the next 1-3 weeks.  Please call us at [phone] if you have not heard about the biopsies in 3 weeks.    SIGNATURES/CONFIDENTIALITY: You and/or your care partner have signed paperwork which will be entered into your electronic medical record.  These signatures attest to the fact that that the information above on your After Visit Summary has been reviewed and is understood.  Full responsibility of the confidentiality of this discharge information lies with you and/or your care-partner.

## 2018-04-03 NOTE — Progress Notes (Signed)
PT taken to PACU. Monitors in place. VSS. Report given to RN. 

## 2018-04-04 ENCOUNTER — Telehealth: Payer: Self-pay | Admitting: *Deleted

## 2018-04-04 NOTE — Telephone Encounter (Signed)
  Follow up Call-  Call back number 04/03/2018  Post procedure Call Back phone  # [phone]  Permission to leave phone message Yes  Some recent data might be hidden     Patient questions:  Do you have a fever, pain , or abdominal swelling? No. Pain Score  0 *  Have you tolerated food without any problems? Yes.    Have you been able to return to your normal activities? Yes.    Do you have any questions about your discharge instructions: Diet   No. Medications  No. Follow up visit  No.  Do you have questions or concerns about your Care? No.  Actions: * If pain score is 4 or above: No action needed, pain <4.

## 2018-04-05 DIAGNOSIS — M1611 Unilateral primary osteoarthritis, right hip: Secondary | ICD-10-CM | POA: Diagnosis not present

## 2018-04-06 ENCOUNTER — Encounter (INDEPENDENT_AMBULATORY_CARE_PROVIDER_SITE_OTHER): Payer: Self-pay | Admitting: Family Medicine

## 2018-04-06 ENCOUNTER — Ambulatory Visit (INDEPENDENT_AMBULATORY_CARE_PROVIDER_SITE_OTHER): Payer: Medicare Other | Admitting: Family Medicine

## 2018-04-06 VITALS — BP 113/72 | HR 50 | Temp 97.8°F | Ht 68.0 in | Wt 255.0 lb

## 2018-04-06 DIAGNOSIS — R7303 Prediabetes: Secondary | ICD-10-CM

## 2018-04-06 DIAGNOSIS — Z6838 Body mass index (BMI) 38.0-38.9, adult: Secondary | ICD-10-CM | POA: Diagnosis not present

## 2018-04-06 DIAGNOSIS — E559 Vitamin D deficiency, unspecified: Secondary | ICD-10-CM | POA: Diagnosis not present

## 2018-04-06 MED ORDER — VITAMIN D (ERGOCALCIFEROL) 1.25 MG (50000 UNIT) PO CAPS: 50000.0000 [IU] | ORAL_CAPSULE | ORAL | 0 refills | Status: DC

## 2018-04-10 NOTE — Progress Notes (Signed)
Office: [phone]  /  Fax: [phone]   HPI:   Chief Complaint: OBESITY Eric [name] is here to discuss his progress with his obesity treatment plan. He is on the Category 3 plan and is following his eating plan approximately 90 % of the time. He states he walked a mile last week. Eric [name] states he indulged over the holidays but got right back on track and is doing very well. His hunger is controlled. He is happy with his plan but his wife is getting bored and she would like to look at other dinner options.  His weight is 255 lb (115.7 kg) today and has had a weight loss of 6 pounds over a period of 5 weeks since his last visit. He has lost 33 lbs since starting treatment with [place].  Vitamin D Deficiency Eric [name] has a diagnosis of vitamin D deficiency. He is stable on prescription Vit D and level is almost at goal. He denies nausea, vomiting or muscle weakness.  Pre-Diabetes Eric [name] has a diagnosis of pre-diabetes based on his elevated Hgb A1c and was informed this puts him at greater risk of developing diabetes. His A1c and fasting insulin have greatly improved with diet and metformin. He denies nausea, vomiting, or hypoglycemia, and he notes decreased polyphagia. He continues to work on diet and exercise to decrease risk of diabetes.  ASSESSMENT AND PLAN:  Vitamin D deficiency - Plan: Vitamin D, Ergocalciferol, (DRISDOL) 1.25 MG (50000 UT) CAPS capsule  Prediabetes  Class 2 severe obesity with serious comorbidity and body mass index (BMI) of 38.0 to 38.9 in adult, unspecified obesity type (Eric [name])  PLAN:  Vitamin D Deficiency Eric [name] was informed that low vitamin D levels contributes to fatigue and are associated with obesity, breast, and colon cancer. Eric [name] agrees to continue taking prescription Vit D @50 ,000 IU every week #4 and we will refill for 1 month. He will follow up for routine testing of vitamin D, at least 2-3 times per year. He was informed of the risk of over-replacement  of vitamin D and agrees to not increase his dose unless he discusses this with [place] first. We will recheck labs in 2 months. Eric [name] agrees to follow up with our clinic in 3 to 4 weeks.  Pre-Diabetes Eric [name] will continue to work on weight loss, diet, exercise, and decreasing simple carbohydrates in his diet to help decrease the risk of diabetes. We dicussed metformin including benefits and risks. He was informed that eating too many simple carbohydrates or too many calories at one sitting increases the likelihood of GI side effects. Eric [name] agrees to continue taking metformin, and he agrees to follow up with our clinic in 3 to 4 weeks as directed to monitor his progress.  Obesity Eric [name] is currently in the action stage of change. As such, his goal is to continue with weight loss efforts He has agreed to keep a food journal with 400-600 calories and 40 grams of protein at supper daily and follow the Category 3 plan Eric [name] has been instructed to work up to a goal of 150 minutes of combined cardio and strengthening exercise per week for weight loss and overall health benefits. We discussed the following Behavioral Modification Strategies today: increasing lean protein intake, decreasing simple carbohydrates  and work on meal planning and easy cooking plans   Eric [name] has agreed to follow up with our clinic in 3 to 4 weeks. He was informed of the importance of frequent follow up visits to maximize his success with intensive lifestyle  modifications for his multiple health conditions.  ALLERGIES: Allergies  Allergen Reactions  . Lisinopril Cough    MEDICATIONS: Current Outpatient Medications on File Prior to Visit  Medication Sig Dispense Refill  . aspirin EC 81 MG tablet Take 81 mg by mouth daily.    [name] atorvastatin (LIPITOR) 40 MG tablet TAKE 1 TABLET DAILY 90 tablet 4  . clobetasol cream (TEMOVATE) 0.05 %     . Coenzyme Q10 (COQ10) 200 MG CAPS Take 200 mg by mouth at bedtime.    .  fluocinonide ointment (LIDEX) 8.46 % Apply 1 application topically daily as needed (for bites).    . fluticasone (FLONASE) 50 MCG/ACT nasal spray Place into the nose.    . loratadine (CLARITIN) 10 MG tablet Take 10 mg by mouth daily as needed for allergies.     . meloxicam (MOBIC) 15 MG tablet TAKE 1 TABLET DAILY 90 tablet 4  . metFORMIN (GLUCOPHAGE) 500 MG tablet Take 1 tablet (500 mg total) by mouth daily with breakfast. 30 tablet 0  . mupirocin ointment (BACTROBAN) 2 % Apply 1 application topically 2 (two) times daily as needed (for skin tear/irritation.).    . Naphazoline-Pheniramine 0.027-0.315 % SOLN Apply to eye.    . NONFORMULARY OR COMPOUNDED ITEM apap cpap maching--auto adjusting pressure  dx sleep apnea 1 each 0  . ondansetron (ZOFRAN) 4 MG tablet Take by mouth.    . pantoprazole (PROTONIX) 40 MG tablet TAKE 1 TABLET DAILY 90 tablet 3  . senna-docusate (SENOKOT-S) 8.6-50 MG tablet Take by mouth.    . tamsulosin (FLOMAX) 0.4 MG CAPS capsule Take 0.4 mg by mouth daily 90 capsule 3  . valsartan (DIOVAN) 80 MG tablet Take 1 tablet (80 mg total) by mouth daily. 90 tablet 3   No current facility-administered medications on file prior to visit.     PAST MEDICAL HISTORY: Past Medical History:  Diagnosis Date  . Alcohol abuse   . Allergy   . Back pain   . Cataract    forming  . Colon polyps   . Diverticulosis of colon   . DJD (degenerative joint disease)   . Dysrhythmia    Palpations from caffiene  . Enlarged prostate   . GERD (gastroesophageal reflux disease)   . Hearing loss   . Hip osteoarthritis    Left  . History of hay fever   . Hx of cardiovascular stress test    Lexiscan Myoview (04/2013):  No ischemia, EF 61%; low risk  . Hyperlipemia   . Hypertension   . Insulin resistance    om metfromin   . Internal hemorrhoids without mention of complication   . Leg edema   . Neuroma of foot    left  . Obesity   . Osteoarthrosis, unspecified whether generalized or  localized, unspecified site   . Palpitations   . 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 Primary localized osteoarthrosis of right shoulder 05/25/2016  . Recent weight loss    75lbs  . Seasonal allergies   . Sleep apnea    CPAP  . Thyroid nodule    calcified thyroid nodules- bx negative   . Tremor of both hands     PAST SURGICAL HISTORY: Past Surgical History:  Procedure Laterality Date  . COLONOSCOPY    . POLYPECTOMY    . SHOULDER SURGERY  2009   right  . TONSILLECTOMY    . TOTAL HIP ARTHROPLASTY Left 03/28/2017   Procedure: TOTAL HIP ARTHROPLASTY ANTERIOR APPROACH;  Surgeon:  Frederik Pear, MD;  Location: [place] [facility];  Service: Orthopedics;  Laterality: Left;  . TOTAL SHOULDER ARTHROPLASTY Left 10/14/2015   Procedure: LEFT TOTAL SHOULDER ARTHROPLASTY;  Surgeon: Marchia Bond, MD;  Location: [facility];  Service: Orthopedics;  Laterality: Left;  . TOTAL SHOULDER ARTHROPLASTY Right 05/25/2016   Procedure: TOTAL SHOULDER ARTHROPLASTY;  Surgeon: Marchia Bond, MD;  Location: [facility];  Service: Orthopedics;  Laterality: Right;    SOCIAL HISTORY: Social History   Tobacco Use  . Smoking status: Never Smoker  . Smokeless tobacco: Never Used  Substance Use Topics  . Alcohol use: Yes    Alcohol/week: 0.0 standard drinks    Comment: rare----12 a year  . Drug use: No    FAMILY HISTORY: Family History  Problem Relation Age of Onset  . Arthritis Mother   . Cancer Mother 39       breast  . Hyperlipidemia Mother   . Colon polyps Mother 60  . Hypertension Mother   . Heart disease Mother   . Depression Mother   . Anxiety disorder Mother   . Obesity Mother   . Breast cancer Mother   . Heart disease Father        CAD--passed away Jun 19, 2011 age 36  . Hyperlipidemia Father   . Hypertension Father   . Obesity Father   . Diabetes Father   . Depression Father   . Sleep apnea Father   . Cancer Sister 76       breast  . Breast cancer Sister   . Cancer Brother 64       prostate  .  Prostate cancer Brother   . Breast cancer Other   . Prostate cancer Other   . Colon cancer Neg Hx   . Esophageal cancer Neg Hx   . Rectal cancer Neg Hx   . Stomach cancer Neg Hx     ROS: Review of Systems  Constitutional: Positive for weight loss.  Gastrointestinal: Negative for nausea and vomiting.  Musculoskeletal:       Negative muscle weakness  Endo/Heme/Allergies:       Negative hypoglycemia Negative polyphagia    PHYSICAL EXAM: Blood pressure 113/72, pulse (!) 50, temperature 97.8 F (36.6 C), temperature source Oral, height 5\' 8"  (1.727 m), weight 255 lb (115.7 kg), SpO2 98 %. Body mass index is 38.77 kg/m. Physical Exam Vitals signs reviewed.  Constitutional:      Appearance: Normal appearance. He is obese.  Cardiovascular:     Rate and Rhythm: Normal rate.     Pulses: Normal pulses.  Pulmonary:     Effort: Pulmonary effort is normal.     Breath sounds: Normal breath sounds.  Musculoskeletal: Normal range of motion.  Skin:    General: Skin is warm and dry.  Neurological:     Mental Status: He is alert and oriented to person, place, and time.  Psychiatric:        Mood and Affect: Mood normal.        Behavior: Behavior normal.     RECENT LABS AND TESTS: BMET    Component Value Date/Time   NA 141 03/09/2018 0859   NA 141 12/14/2017 1030   K 4.6 03/09/2018 0859   CL 105 03/09/2018 0859   CO2 27 03/09/2018 0859   GLUCOSE 97 03/09/2018 0859   BUN 26 (H) 03/09/2018 0859   BUN 19 12/14/2017 1030   CREATININE 1.06 03/09/2018 0859   CREATININE 0.93 04/23/2016 1633   CALCIUM 9.4 03/09/2018 0859  GFRNONAA 70 12/14/2017 1030   GFRAA 81 12/14/2017 1030   Lab Results  Component Value Date   HGBA1C 5.4 03/09/2018   HGBA1C 5.7 (H) 12/14/2017   HGBA1C 5.6 03/08/2017   HGBA1C 5.5 11/18/2016   HGBA1C 5.5 03/05/2015   Lab Results  Component Value Date   INSULIN 21.3 12/14/2017   CBC    Component Value Date/Time   WBC 6.8 12/14/2017 1030   WBC 10.9  (H) 03/29/2017 0851   RBC 5.04 12/14/2017 1030   RBC 4.50 03/29/2017 0851   HGB 15.5 12/14/2017 1030   HCT 45.1 12/14/2017 1030   PLT 177 03/29/2017 0851   MCV 90 12/14/2017 1030   MCH 30.8 12/14/2017 1030   MCH 30.2 03/29/2017 0851   MCHC 34.4 12/14/2017 1030   MCHC 31.9 03/29/2017 0851   RDW 13.0 12/14/2017 1030   LYMPHSABS 1.7 12/14/2017 1030   MONOABS 0.5 03/16/2017 0839   EOSABS 0.1 12/14/2017 1030   BASOSABS 0.0 12/14/2017 1030   Iron/TIBC/Ferritin/ %Sat No results found for: IRON, TIBC, FERRITIN, IRONPCTSAT Lipid Panel     Component Value Date/Time   CHOL 115 03/09/2018 0859   CHOL 150 12/14/2017 1030   TRIG 85.0 03/09/2018 0859   HDL 36.30 (L) 03/09/2018 0859   HDL 52 12/14/2017 1030   CHOLHDL 3 03/09/2018 0859   VLDL 17.0 03/09/2018 0859   LDLCALC 62 03/09/2018 0859   LDLCALC 78 12/14/2017 1030   Hepatic Function Panel     Component Value Date/Time   PROT 6.6 03/09/2018 0859   PROT 6.5 12/14/2017 1030   ALBUMIN 4.3 03/09/2018 0859   ALBUMIN 4.5 12/14/2017 1030   AST 12 03/09/2018 0859   ALT 13 03/09/2018 0859   ALKPHOS 58 03/09/2018 0859   BILITOT 0.7 03/09/2018 0859   BILITOT 0.6 12/14/2017 1030   BILIDIR 0.2 06/07/2014 0909      Component Value Date/Time   TSH 1.060 12/14/2017 1030   TSH 0.62 10/03/2015 1230   TSH 0.51 10/08/2010 0859      OBESITY BEHAVIORAL INTERVENTION VISIT  Today's visit was # 6   Starting weight: 288 lbs Starting date: 12/14/17 Today's weight : 255 lbs  Today's date: 04/06/2018 Total lbs lost to date: 33 At least 15 minutes were spent on discussing the following behavioral intervention visit.   ASK: We discussed the diagnosis of obesity with Eric [name] today and Eric [name] agreed to give [place] permission to discuss obesity behavioral modification therapy today.  ASSESS: Eric [name] has the diagnosis of obesity and his BMI today is 38.78 Eric [name] is in the action stage of change   ADVISE: Eric [name] was educated on the  multiple health risks of obesity as well as the benefit of weight loss to improve his health. He was advised of the need for long term treatment and the importance of lifestyle modifications to improve his current health and to decrease his risk of future health problems.  AGREE: Multiple dietary modification options and treatment options were discussed and  Eric [name] agreed to follow the recommendations documented in the above note.  ARRANGE: Eric [name] was educated on the importance of frequent visits to treat obesity as outlined per CMS and USPSTF guidelines and agreed to schedule his next follow up appointment today.  I, Trixie Dredge, am acting as transcriptionist for [name], MD  I have reviewed the above documentation for accuracy and completeness, and I agree with the above. -[name], MD

## 2018-05-04 ENCOUNTER — Encounter (INDEPENDENT_AMBULATORY_CARE_PROVIDER_SITE_OTHER): Payer: Self-pay | Admitting: Physician Assistant

## 2018-05-04 ENCOUNTER — Ambulatory Visit (INDEPENDENT_AMBULATORY_CARE_PROVIDER_SITE_OTHER): Payer: Medicare Other | Admitting: Physician Assistant

## 2018-05-04 VITALS — BP 134/76 | HR 70 | Ht 68.0 in | Wt 252.0 lb

## 2018-05-04 DIAGNOSIS — R7303 Prediabetes: Secondary | ICD-10-CM

## 2018-05-04 DIAGNOSIS — E559 Vitamin D deficiency, unspecified: Secondary | ICD-10-CM

## 2018-05-04 DIAGNOSIS — Z6838 Body mass index (BMI) 38.0-38.9, adult: Secondary | ICD-10-CM | POA: Diagnosis not present

## 2018-05-04 MED ORDER — METFORMIN HCL 500 MG PO TABS: 500.0000 mg | ORAL_TABLET | Freq: Every day | ORAL | 0 refills | Status: DC

## 2018-05-04 MED ORDER — VITAMIN D (ERGOCALCIFEROL) 1.25 MG (50000 UNIT) PO CAPS: 50000.0000 [IU] | ORAL_CAPSULE | ORAL | 0 refills | Status: DC

## 2018-05-04 NOTE — Progress Notes (Signed)
Office: [phone]  /  Fax: [phone]   HPI:   Chief Complaint: OBESITY Eric [name] is here to discuss his progress with his obesity treatment plan. He is on the Category 3 plan and is following his eating plan approximately 90-95% of the time. He states he is walking 16-30 minutes 2 times per week. Eric [name] did very well with weight loss. He reports that he is slightly bored with lunch. He is traveling to [place] for a fishing trip in 2 weeks. His weight is 252 lb (114.3 kg) today and has had a weight loss of 3 pounds over a period of 4 weeks since his last visit. He has lost 36 lbs since starting treatment with [place].  Vitamin D deficiency Eric [name] has a diagnosis of Vitamin D deficiency. He is currently taking prescription Vit D and denies nausea, vomiting or muscle weakness.  Pre-Diabetes Eric [name] has a diagnosis of prediabetes based on his elevated Hgb A1c and was informed this puts him at greater risk of developing diabetes. He is taking metformin currently and continues to work on diet and exercise to decrease risk of diabetes. He denies nausea, vomiting, diarrhea, polyphagia, or hypoglycemia.  ASSESSMENT AND PLAN:  Vitamin D deficiency - Plan: Vitamin D, Ergocalciferol, (DRISDOL) 1.25 MG (50000 UT) CAPS capsule  Prediabetes - Plan: metFORMIN (GLUCOPHAGE) 500 MG tablet  Class 2 severe obesity with serious comorbidity and body mass index (BMI) of 38.0 to 38.9 in adult, unspecified obesity type ([place])  PLAN:  Vitamin D Deficiency Eric [name] was informed that low Vitamin D levels contributes to fatigue and are associated with obesity, breast, and colon cancer. He agrees to continue to take prescription Vit D @ 50,000 IU every week #4 with no refills and will follow-up for routine testing of Vitamin D, at least 2-3 times per year. He was informed of the risk of over-replacement of Vitamin D and agrees to not increase his dose unless he discusses this with [place] first. Eric [name] agrees to  follow-up with our clinic in 4 weeks.  Pre-Diabetes Eric [name] will continue to work on weight loss, exercise, and decreasing simple carbohydrates in his diet to help decrease the risk of diabetes. We dicussed metformin including benefits and risks. He was informed that eating too many simple carbohydrates or too many calories at one sitting increases the likelihood of GI side effects. Eric [name] is on metformin for now and a prescription was written today for #30 with no refills. Eric [name] agreed to follow-up with [place] as directed to monitor his progress. Eric [name] agrees to follow-up with our clinic in 4 weeks.  Obesity Eric [name] is currently in the action stage of change. As such, his goal is to continue with weight loss efforts. He has agreed to follow the Category 3 plan. Eric [name] has been instructed to work up to a goal of 150 minutes of combined cardio and strengthening exercise per week for weight loss and overall health benefits. We discussed the following Behavioral Modification Strategies today: work on meal planning and easy cooking plans and keeping healthy foods in the home.  Eric [name] has agreed to follow-up with our clinic in 4 weeks. He was informed of the importance of frequent follow up visits to maximize his success with intensive lifestyle modifications for his multiple health conditions.  ALLERGIES: Allergies  Allergen Reactions  . Lisinopril Cough    MEDICATIONS: Current Outpatient Medications on File Prior to Visit  Medication Sig Dispense Refill  . aspirin EC 81 MG tablet Take 81 mg by mouth daily.    [name]  atorvastatin (LIPITOR) 40 MG tablet TAKE 1 TABLET DAILY 90 tablet 4  . clobetasol cream (TEMOVATE) 0.05 %     . Coenzyme Q10 (COQ10) 200 MG CAPS Take 200 mg by mouth at bedtime.    . fluocinonide ointment (LIDEX) 0.25 % Apply 1 application topically daily as needed (for bites).    . fluticasone (FLONASE) 50 MCG/ACT nasal spray Place into the nose.    . loratadine (CLARITIN) 10 MG  tablet Take 10 mg by mouth daily as needed for allergies.     . meloxicam (MOBIC) 15 MG tablet TAKE 1 TABLET DAILY 90 tablet 4  . mupirocin ointment (BACTROBAN) 2 % Apply 1 application topically 2 (two) times daily as needed (for skin tear/irritation.).    . Naphazoline-Pheniramine 0.027-0.315 % SOLN Apply to eye.    . NONFORMULARY OR COMPOUNDED ITEM apap cpap maching--auto adjusting pressure  dx sleep apnea 1 each 0  . ondansetron (ZOFRAN) 4 MG tablet Take by mouth.    . pantoprazole (PROTONIX) 40 MG tablet TAKE 1 TABLET DAILY 90 tablet 3  . senna-docusate (SENOKOT-S) 8.6-50 MG tablet Take by mouth.    . tamsulosin (FLOMAX) 0.4 MG CAPS capsule Take 0.4 mg by mouth daily 90 capsule 3  . valsartan (DIOVAN) 80 MG tablet Take 1 tablet (80 mg total) by mouth daily. 90 tablet 3   No current facility-administered medications on file prior to visit.     PAST MEDICAL HISTORY: Past Medical History:  Diagnosis Date  . Alcohol abuse   . Allergy   . Back pain   . Cataract    forming  . Colon polyps   . Diverticulosis of colon   . DJD (degenerative joint disease)   . Dysrhythmia    Palpations from caffiene  . Enlarged prostate   . GERD (gastroesophageal reflux disease)   . Hearing loss   . Hip osteoarthritis    Left  . History of hay fever   . Hx of cardiovascular stress test    Lexiscan Myoview (04/2013):  No ischemia, EF 61%; low risk  . Hyperlipemia   . Hypertension   . Insulin resistance    om metfromin   . Internal hemorrhoids without mention of complication   . Leg edema   . Neuroma of foot    left  . Obesity   . Osteoarthrosis, unspecified whether generalized or localized, unspecified site   . Palpitations   . 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 Primary localized osteoarthrosis of right shoulder 05/25/2016  . Recent weight loss    75lbs  . Seasonal allergies   . Sleep apnea    CPAP  . Thyroid nodule    calcified thyroid nodules- bx negative   . Tremor  of both hands     PAST SURGICAL HISTORY: Past Surgical History:  Procedure Laterality Date  . COLONOSCOPY    . POLYPECTOMY    . SHOULDER SURGERY  2009   right  . TONSILLECTOMY    . TOTAL HIP ARTHROPLASTY Left 03/28/2017   Procedure: TOTAL HIP ARTHROPLASTY ANTERIOR APPROACH;  Surgeon: Frederik Pear, MD;  Location: [facility];  Service: Orthopedics;  Laterality: Left;  . TOTAL SHOULDER ARTHROPLASTY Left 10/14/2015   Procedure: LEFT TOTAL SHOULDER ARTHROPLASTY;  Surgeon: Marchia Bond, MD;  Location: [facility];  Service: Orthopedics;  Laterality: Left;  . TOTAL SHOULDER ARTHROPLASTY Right 05/25/2016   Procedure: TOTAL SHOULDER ARTHROPLASTY;  Surgeon: Marchia Bond, MD;  Location: [facility];  Service: Orthopedics;  Laterality: Right;  SOCIAL HISTORY: Social History   Tobacco Use  . Smoking status: Never Smoker  . Smokeless tobacco: Never Used  Substance Use Topics  . Alcohol use: Yes    Alcohol/week: 0.0 standard drinks    Comment: rare----12 a year  . Drug use: No    FAMILY HISTORY: Family History  Problem Relation Age of Onset  . Arthritis Mother   . Cancer Mother 44       breast  . Hyperlipidemia Mother   . Colon polyps Mother 58  . Hypertension Mother   . Heart disease Mother   . Depression Mother   . Anxiety disorder Mother   . Obesity Mother   . Breast cancer Mother   . Heart disease Father        CAD--passed away Jul 06, 2011 age 11  . Hyperlipidemia Father   . Hypertension Father   . Obesity Father   . Diabetes Father   . Depression Father   . Sleep apnea Father   . Cancer Sister 80       breast  . Breast cancer Sister   . Cancer Brother 7       prostate  . Prostate cancer Brother   . Breast cancer Other   . Prostate cancer Other   . Colon cancer Neg Hx   . Esophageal cancer Neg Hx   . Rectal cancer Neg Hx   . Stomach cancer Neg Hx    ROS: Review of Systems  Constitutional: Positive for weight loss.  Gastrointestinal: Negative for diarrhea, nausea and vomiting.    Musculoskeletal:       Negative for muscle weakness.  Endo/Heme/Allergies:       Negative for polyphagia. Negative for hypoglycemia.   PHYSICAL EXAM: Blood pressure 134/76, pulse 70, height 5\' 8"  (1.727 m), weight 252 lb (114.3 kg), SpO2 98 %. Body mass index is 38.32 kg/m. Physical Exam Vitals signs reviewed.  Constitutional:      Appearance: Normal appearance. He is obese.  Cardiovascular:     Rate and Rhythm: Normal rate.     Pulses: Normal pulses.  Pulmonary:     Effort: Pulmonary effort is normal.     Breath sounds: Normal breath sounds.  Musculoskeletal: Normal range of motion.  Skin:    General: Skin is warm and dry.  Neurological:     Mental Status: He is alert and oriented to person, place, and time.  Psychiatric:        Behavior: Behavior normal.   RECENT LABS AND TESTS: BMET    Component Value Date/Time   NA 141 03/09/2018 0859   NA 141 12/14/2017 1030   K 4.6 03/09/2018 0859   CL 105 03/09/2018 0859   CO2 27 03/09/2018 0859   GLUCOSE 97 03/09/2018 0859   BUN 26 (H) 03/09/2018 0859   BUN 19 12/14/2017 1030   CREATININE 1.06 03/09/2018 0859   CREATININE 0.93 04/23/2016 1633   CALCIUM 9.4 03/09/2018 0859   GFRNONAA 70 12/14/2017 1030   GFRAA 81 12/14/2017 1030   Lab Results  Component Value Date   HGBA1C 5.4 03/09/2018   HGBA1C 5.7 (H) 12/14/2017   HGBA1C 5.6 03/08/2017   HGBA1C 5.5 11/18/2016   HGBA1C 5.5 03/05/2015   Lab Results  Component Value Date   INSULIN 21.3 12/14/2017   CBC    Component Value Date/Time   WBC 6.8 12/14/2017 1030   WBC 10.9 (H) 03/29/2017 0851   RBC 5.04 12/14/2017 1030   RBC 4.50 03/29/2017 0851  HGB 15.5 12/14/2017 1030   HCT 45.1 12/14/2017 1030   PLT 177 03/29/2017 0851   MCV 90 12/14/2017 1030   MCH 30.8 12/14/2017 1030   MCH 30.2 03/29/2017 0851   MCHC 34.4 12/14/2017 1030   MCHC 31.9 03/29/2017 0851   RDW 13.0 12/14/2017 1030   LYMPHSABS 1.7 12/14/2017 1030   MONOABS 0.5 03/16/2017 0839   EOSABS  0.1 12/14/2017 1030   BASOSABS 0.0 12/14/2017 1030   Iron/TIBC/Ferritin/ %Sat No results found for: IRON, TIBC, FERRITIN, IRONPCTSAT Lipid Panel     Component Value Date/Time   CHOL 115 03/09/2018 0859   CHOL 150 12/14/2017 1030   TRIG 85.0 03/09/2018 0859   HDL 36.30 (L) 03/09/2018 0859   HDL 52 12/14/2017 1030   CHOLHDL 3 03/09/2018 0859   VLDL 17.0 03/09/2018 0859   LDLCALC 62 03/09/2018 0859   LDLCALC 78 12/14/2017 1030   Hepatic Function Panel     Component Value Date/Time   PROT 6.6 03/09/2018 0859   PROT 6.5 12/14/2017 1030   ALBUMIN 4.3 03/09/2018 0859   ALBUMIN 4.5 12/14/2017 1030   AST 12 03/09/2018 0859   ALT 13 03/09/2018 0859   ALKPHOS 58 03/09/2018 0859   BILITOT 0.7 03/09/2018 0859   BILITOT 0.6 12/14/2017 1030   BILIDIR 0.2 06/07/2014 0909      Component Value Date/Time   TSH 1.060 12/14/2017 1030   TSH 0.62 10/03/2015 1230   TSH 0.51 10/08/2010 0859   Results for Steward, Luther L "MIKE" (MRN [id number]) as of 05/04/2018 12:11  Ref. Range 12/14/2017 10:30  Vitamin D, 25-Hydroxy Latest Ref Range: 30.0 - 100.0 ng/mL 23.2 (L)   OBESITY BEHAVIORAL INTERVENTION VISIT  Today's visit was #7  Starting weight: 288 lbs  Starting date: 12/14/2017 Today's weight: 252 lbs Today's date: 05/04/2018 Total lbs lost to date: 36 At least 15 minutes were spent on discussing the following behavioral intervention visit.  ASK: We discussed the diagnosis of obesity with [name] today and Waris agreed to give [place] permission to discuss obesity behavioral modification therapy today.  ASSESS: Nile has the diagnosis of obesity and his BMI today is 38.32. Andre is in the action stage of change.   ADVISE: Kendrik was educated on the multiple health risks of obesity as well as the benefit of weight loss to improve his health. He was advised of the need for long term treatment and the importance of lifestyle modifications to improve his current health and  to decrease his risk of future health problems.  AGREE: Multiple dietary modification options and treatment options were discussed and  Rama agreed to follow the recommendations documented in the above note.  ARRANGE: English was educated on the importance of frequent visits to treat obesity as outlined per CMS and USPSTF guidelines and agreed to schedule his next follow up appointment today.  Eric [name], am acting as transcriptionist for Abby Potash, PA-C I, Abby Potash, PA-C have reviewed above note and agree with its content

## 2018-05-26 DIAGNOSIS — M19012 Primary osteoarthritis, left shoulder: Secondary | ICD-10-CM | POA: Diagnosis not present

## 2018-05-26 DIAGNOSIS — M19011 Primary osteoarthritis, right shoulder: Secondary | ICD-10-CM | POA: Diagnosis not present

## 2018-06-06 ENCOUNTER — Encounter (INDEPENDENT_AMBULATORY_CARE_PROVIDER_SITE_OTHER): Payer: Self-pay | Admitting: Family Medicine

## 2018-06-06 ENCOUNTER — Ambulatory Visit (INDEPENDENT_AMBULATORY_CARE_PROVIDER_SITE_OTHER): Payer: Medicare Other | Admitting: Family Medicine

## 2018-06-06 ENCOUNTER — Other Ambulatory Visit: Payer: Self-pay

## 2018-06-06 VITALS — BP 128/71 | HR 52 | Ht 68.0 in | Wt 247.0 lb

## 2018-06-06 DIAGNOSIS — Z6835 Body mass index (BMI) 35.0-35.9, adult: Secondary | ICD-10-CM | POA: Diagnosis not present

## 2018-06-06 DIAGNOSIS — Z6837 Body mass index (BMI) 37.0-37.9, adult: Secondary | ICD-10-CM

## 2018-06-06 DIAGNOSIS — R7303 Prediabetes: Secondary | ICD-10-CM

## 2018-06-06 DIAGNOSIS — E559 Vitamin D deficiency, unspecified: Secondary | ICD-10-CM

## 2018-06-06 DIAGNOSIS — E66812 Obesity, class 2: Secondary | ICD-10-CM

## 2018-06-06 MED ORDER — VITAMIN D (ERGOCALCIFEROL) 1.25 MG (50000 UNIT) PO CAPS: 50000.0000 [IU] | ORAL_CAPSULE | ORAL | 0 refills | Status: DC

## 2018-06-06 MED ORDER — METFORMIN HCL 500 MG PO TABS: 500.0000 mg | ORAL_TABLET | Freq: Every day | ORAL | 0 refills | Status: DC

## 2018-06-06 NOTE — Progress Notes (Signed)
Office: [phone]  /  Fax: [phone]   HPI:   Chief Complaint: OBESITY Eric [name] is here to discuss his progress with his obesity treatment plan. He is on the Category 3 plan and is following his eating plan approximately 80 % of the time. He states he is doing more yard work. Eric [name] continues to do well with weight loss on the Category 3 diet. His hunger is controlled. He deviated some while on vacation, but was still mindful.  His weight is 247 lb (112 kg) today and has had a weight loss of 5 pounds over a period of 5 weeks since his last visit. He has lost 41 lbs since starting treatment with [place].  Pre-Diabetes Eric [name] has a diagnosis of pre-diabetes based on his elevated Hgb A1c and was informed this puts him at greater risk of developing diabetes. He is stable on metformin and diet. He continues to work on diet and exercise to decrease risk of diabetes. He denies nausea, vomiting, or hypoglycemia.   Vitamin D Deficiency Eric [name] has a diagnosis of vitamin D deficiency. He is currently stable on vit D. Eric [name] denies nausea, vomiting, or muscle weakness.  ASSESSMENT AND PLAN:  Vitamin D deficiency - Plan: Vitamin D, Ergocalciferol, (DRISDOL) 1.25 MG (50000 UT) CAPS capsule  Prediabetes - Plan: metFORMIN (GLUCOPHAGE) 500 MG tablet  Class 2 severe obesity with serious comorbidity and body mass index (BMI) of 37.0 to 37.9 in adult, unspecified obesity type Eric [name])  PLAN:  Pre-Diabetes Eric [name] will continue to work on weight loss, exercise, and decreasing simple carbohydrates in his diet to help decrease the risk of diabetes. He was informed that eating too many simple carbohydrates or too many calories at one sitting increases the likelihood of GI side effects. Eric [name] agreed to continue his diet, exercise, and metformin 500 mg qAM #30 with no refills and a prescription was written today. Eric [name] agreed to follow up with [place] as directed to monitor his progress.   Vitamin D  Deficiency Eric [name] was informed that low vitamin D levels contribute to fatigue and are associated with obesity, breast, and colon cancer. Eric [name] agrees to continue to take prescription Vit D @50 ,000 IU every week #4 with no refills and will follow up for routine testing of vitamin D, at least 2-3 times per year. He was informed of the risk of over-replacement of vitamin D and agrees to not increase his dose unless he discusses this with [place] first. Eric [name] agrees to follow up in 4 weeks as directed.  Obesity Eric [name] is currently in the action stage of change. As such, his goal is to continue with weight loss efforts. He has agreed to follow the Category 3 plan. Eric [name] has been instructed to work up to a goal of 150 minutes of combined cardio and strengthening exercise per week for weight loss and overall health benefits. We discussed the following Behavioral Modification Stratagies today: increasing lean protein intake, decreasing simple carbohydrates, work on meal planning and easy cooking plans, emotional eating strategies, ways to avoid boredom eating, keeping healthy foods in the home, better snacking choices, and ways to avoid night time snacking.  Eric [name] has agreed to follow up with our clinic in 3 to 4 weeks. He was informed of the importance of frequent follow up visits to maximize his success with intensive lifestyle modifications for his multiple health conditions.  ALLERGIES: Allergies  Allergen Reactions  . Lisinopril Cough    MEDICATIONS: Current Outpatient Medications on File Prior to Visit  Medication Sig  Dispense Refill  . aspirin EC 81 MG tablet Take 81 mg by mouth daily.    [name] atorvastatin (LIPITOR) 40 MG tablet TAKE 1 TABLET DAILY 90 tablet 4  . clobetasol cream (TEMOVATE) 0.05 %     . Coenzyme Q10 (COQ10) 200 MG CAPS Take 200 mg by mouth at bedtime.    . fluocinonide ointment (LIDEX) 8.14 % Apply 1 application topically daily as needed (for bites).    . fluticasone  (FLONASE) 50 MCG/ACT nasal spray Place into the nose.    . loratadine (CLARITIN) 10 MG tablet Take 10 mg by mouth daily as needed for allergies.     . meloxicam (MOBIC) 15 MG tablet TAKE 1 TABLET DAILY 90 tablet 4  . mupirocin ointment (BACTROBAN) 2 % Apply 1 application topically 2 (two) times daily as needed (for skin tear/irritation.).    . Naphazoline-Pheniramine 0.027-0.315 % SOLN Apply to eye.    . NONFORMULARY OR COMPOUNDED ITEM apap cpap maching--auto adjusting pressure  dx sleep apnea 1 each 0  . ondansetron (ZOFRAN) 4 MG tablet Take by mouth.    . pantoprazole (PROTONIX) 40 MG tablet TAKE 1 TABLET DAILY 90 tablet 3  . senna-docusate (SENOKOT-S) 8.6-50 MG tablet Take by mouth.    . tamsulosin (FLOMAX) 0.4 MG CAPS capsule Take 0.4 mg by mouth daily 90 capsule 3  . valsartan (DIOVAN) 80 MG tablet Take 1 tablet (80 mg total) by mouth daily. 90 tablet 3   No current facility-administered medications on file prior to visit.     PAST MEDICAL HISTORY: Past Medical History:  Diagnosis Date  . Alcohol abuse   . Allergy   . Back pain   . Cataract    forming  . Colon polyps   . Diverticulosis of colon   . DJD (degenerative joint disease)   . Dysrhythmia    Palpations from caffiene  . Enlarged prostate   . GERD (gastroesophageal reflux disease)   . Hearing loss   . Hip osteoarthritis    Left  . History of hay fever   . Hx of cardiovascular stress test    Lexiscan Myoview (04/2013):  No ischemia, EF 61%; low risk  . Hyperlipemia   . Hypertension   . Insulin resistance    om metfromin   . Internal hemorrhoids without mention of complication   . Leg edema   . Neuroma of foot    left  . Obesity   . Osteoarthrosis, unspecified whether generalized or localized, unspecified site   . Palpitations   . 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 Primary localized osteoarthrosis of right shoulder 05/25/2016  . Recent weight loss    75lbs  . Seasonal allergies   . Sleep  apnea    CPAP  . Thyroid nodule    calcified thyroid nodules- bx negative   . Tremor of both hands     PAST SURGICAL HISTORY: Past Surgical History:  Procedure Laterality Date  . COLONOSCOPY    . POLYPECTOMY    . SHOULDER SURGERY  2009   right  . TONSILLECTOMY    . TOTAL HIP ARTHROPLASTY Left 03/28/2017   Procedure: TOTAL HIP ARTHROPLASTY ANTERIOR APPROACH;  Surgeon: Frederik Pear, MD;  Location: [facility];  Service: Orthopedics;  Laterality: Left;  . TOTAL SHOULDER ARTHROPLASTY Left 10/14/2015   Procedure: LEFT TOTAL SHOULDER ARTHROPLASTY;  Surgeon: Marchia Bond, MD;  Location: [facility];  Service: Orthopedics;  Laterality: Left;  . TOTAL SHOULDER ARTHROPLASTY Right 05/25/2016   Procedure:  TOTAL SHOULDER ARTHROPLASTY;  Surgeon: Marchia Bond, MD;  Location: [facility];  Service: Orthopedics;  Laterality: Right;    SOCIAL HISTORY: Social History   Tobacco Use  . Smoking status: Never Smoker  . Smokeless tobacco: Never Used  Substance Use Topics  . Alcohol use: Yes    Alcohol/week: 0.0 standard drinks    Comment: rare----12 a year  . Drug use: No    FAMILY HISTORY: Family History  Problem Relation Age of Onset  . Arthritis Mother   . Cancer Mother 11       breast  . Hyperlipidemia Mother   . Colon polyps Mother 22  . Hypertension Mother   . Heart disease Mother   . Depression Mother   . Anxiety disorder Mother   . Obesity Mother   . Breast cancer Mother   . Heart disease Father        CAD--passed away 2011/06/24 age 62  . Hyperlipidemia Father   . Hypertension Father   . Obesity Father   . Diabetes Father   . Depression Father   . Sleep apnea Father   . Cancer Sister 62       breast  . Breast cancer Sister   . Cancer Brother 18       prostate  . Prostate cancer Brother   . Breast cancer Other   . Prostate cancer Other   . Colon cancer Neg Hx   . Esophageal cancer Neg Hx   . Rectal cancer Neg Hx   . Stomach cancer Neg Hx    ROS: Review of Systems  Gastrointestinal:  Negative for nausea and vomiting.  Musculoskeletal:       Negative for muscle weakness.  Endo/Heme/Allergies:       Negative for hypoglycemia.   PHYSICAL EXAM: Blood pressure 128/71, pulse (!) 52, height 5\' 8"  (1.727 m), weight 247 lb (112 kg), SpO2 99 %. Body mass index is 37.56 kg/m. Physical Exam Vitals signs reviewed.  Constitutional:      Appearance: Normal appearance. He is obese.  Cardiovascular:     Rate and Rhythm: Normal rate.  Pulmonary:     Effort: Pulmonary effort is normal.  Musculoskeletal: Normal range of motion.  Skin:    General: Skin is warm and dry.  Neurological:     Mental Status: He is alert and oriented to person, place, and time.  Psychiatric:        Mood and Affect: Mood normal.        Behavior: Behavior normal.    RECENT LABS AND TESTS: BMET    Component Value Date/Time   NA 141 03/09/2018 0859   NA 141 12/14/2017 1030   K 4.6 03/09/2018 0859   CL 105 03/09/2018 0859   CO2 27 03/09/2018 0859   GLUCOSE 97 03/09/2018 0859   BUN 26 (H) 03/09/2018 0859   BUN 19 12/14/2017 1030   CREATININE 1.06 03/09/2018 0859   CREATININE 0.93 04/23/2016 1633   CALCIUM 9.4 03/09/2018 0859   GFRNONAA 70 12/14/2017 1030   GFRAA 81 12/14/2017 1030   Lab Results  Component Value Date   HGBA1C 5.4 03/09/2018   HGBA1C 5.7 (H) 12/14/2017   HGBA1C 5.6 03/08/2017   HGBA1C 5.5 11/18/2016   HGBA1C 5.5 03/05/2015   Lab Results  Component Value Date   INSULIN 21.3 12/14/2017   CBC    Component Value Date/Time   WBC 6.8 12/14/2017 1030   WBC 10.9 (H) 03/29/2017 0851   RBC 5.04 12/14/2017 1030  RBC 4.50 03/29/2017 0851   HGB 15.5 12/14/2017 1030   HCT 45.1 12/14/2017 1030   PLT 177 03/29/2017 0851   MCV 90 12/14/2017 1030   MCH 30.8 12/14/2017 1030   MCH 30.2 03/29/2017 0851   MCHC 34.4 12/14/2017 1030   MCHC 31.9 03/29/2017 0851   RDW 13.0 12/14/2017 1030   LYMPHSABS 1.7 12/14/2017 1030   MONOABS 0.5 03/16/2017 0839   EOSABS 0.1 12/14/2017 1030    BASOSABS 0.0 12/14/2017 1030   Iron/TIBC/Ferritin/ %Sat No results found for: IRON, TIBC, FERRITIN, IRONPCTSAT Lipid Panel     Component Value Date/Time   CHOL 115 03/09/2018 0859   CHOL 150 12/14/2017 1030   TRIG 85.0 03/09/2018 0859   HDL 36.30 (L) 03/09/2018 0859   HDL 52 12/14/2017 1030   CHOLHDL 3 03/09/2018 0859   VLDL 17.0 03/09/2018 0859   LDLCALC 62 03/09/2018 0859   LDLCALC 78 12/14/2017 1030   Hepatic Function Panel     Component Value Date/Time   PROT 6.6 03/09/2018 0859   PROT 6.5 12/14/2017 1030   ALBUMIN 4.3 03/09/2018 0859   ALBUMIN 4.5 12/14/2017 1030   AST 12 03/09/2018 0859   ALT 13 03/09/2018 0859   ALKPHOS 58 03/09/2018 0859   BILITOT 0.7 03/09/2018 0859   BILITOT 0.6 12/14/2017 1030   BILIDIR 0.2 06/07/2014 0909      Component Value Date/Time   TSH 1.060 12/14/2017 1030   TSH 0.62 10/03/2015 1230   TSH 0.51 10/08/2010 0859   Results for Miao, Wen L "MIKE" (MRN [id number]) as of 06/06/2018 12:53  Ref. Range 12/14/2017 10:30  Vitamin D, 25-Hydroxy Latest Ref Range: 30.0 - 100.0 ng/mL 23.2 (L)   OBESITY BEHAVIORAL INTERVENTION VISIT  Today's visit was # 8   Starting weight: 288 lbs Starting date: 12/14/17 Today's weight : Weight: 247 lb (112 kg)  Today's date: 06/06/2018 Total lbs lost to date: 41 At least 15 minutes were spent on discussing the following behavioral intervention visit.    06/06/2018  Height 5\' 8"  (1.727 m)  Weight 247 lb (112 kg)  BMI (Calculated) 37.56  BLOOD PRESSURE - SYSTOLIC 656  BLOOD PRESSURE - DIASTOLIC 71   Body Fat % 81.2 %  Total Body Water (lbs) 106.6 lbs   ASK: We discussed the diagnosis of obesity with [name] today and Ezana agreed to give [place] permission to discuss obesity behavioral modification therapy today.  ASSESS: Dontez has the diagnosis of obesity and his BMI today is 37.56. Chanse is in the action stage of change.   ADVISE: Lamarco was educated on the multiple health risks  of obesity as well as the benefit of weight loss to improve his health. He was advised of the need for long term treatment and the importance of lifestyle modifications to improve his current health and to decrease his risk of future health problems.  AGREE: Multiple dietary modification options and treatment options were discussed and Haruo agreed to follow the recommendations documented in the above note.  ARRANGE: Vibhav was educated on the importance of frequent visits to treat obesity as outlined per CMS and USPSTF guidelines and agreed to schedule his next follow up appointment today.  IMarcille Blanco, CMA, am acting as transcriptionist for Starlyn Skeans, MD  I have reviewed the above documentation for accuracy and completeness, and I agree with the above. -[name], MD

## 2018-06-13 ENCOUNTER — Encounter (INDEPENDENT_AMBULATORY_CARE_PROVIDER_SITE_OTHER): Payer: Self-pay

## 2018-06-17 ENCOUNTER — Other Ambulatory Visit: Payer: Self-pay | Admitting: Family Medicine

## 2018-06-17 DIAGNOSIS — I10 Essential (primary) hypertension: Secondary | ICD-10-CM

## 2018-06-28 ENCOUNTER — Encounter (INDEPENDENT_AMBULATORY_CARE_PROVIDER_SITE_OTHER): Payer: Self-pay | Admitting: Family Medicine

## 2018-07-04 ENCOUNTER — Encounter (INDEPENDENT_AMBULATORY_CARE_PROVIDER_SITE_OTHER): Payer: Self-pay | Admitting: Family Medicine

## 2018-07-04 ENCOUNTER — Other Ambulatory Visit: Payer: Self-pay

## 2018-07-04 ENCOUNTER — Ambulatory Visit (INDEPENDENT_AMBULATORY_CARE_PROVIDER_SITE_OTHER): Payer: Medicare Other | Admitting: Family Medicine

## 2018-07-04 DIAGNOSIS — R7303 Prediabetes: Secondary | ICD-10-CM

## 2018-07-04 DIAGNOSIS — E559 Vitamin D deficiency, unspecified: Secondary | ICD-10-CM

## 2018-07-04 DIAGNOSIS — Z6837 Body mass index (BMI) 37.0-37.9, adult: Secondary | ICD-10-CM | POA: Diagnosis not present

## 2018-07-04 MED ORDER — METFORMIN HCL 500 MG PO TABS: 500.0000 mg | ORAL_TABLET | Freq: Every day | ORAL | 0 refills | Status: DC

## 2018-07-04 MED ORDER — VITAMIN D (ERGOCALCIFEROL) 1.25 MG (50000 UNIT) PO CAPS: 50000.0000 [IU] | ORAL_CAPSULE | ORAL | 0 refills | Status: DC

## 2018-07-04 NOTE — Progress Notes (Signed)
Office: [phone]  /  Fax: [phone] TeleHealth Visit:  Eric [name] has verbally consented to this TeleHealth visit today. The patient is located at home, the provider is located at the [facility] office. The participants in this visit include the listed provider, patient, and his spouse [name]. The visit was conducted today via Face Time.  HPI:   Chief Complaint: OBESITY Eric [name] is here to discuss his progress with his obesity treatment plan. He is on the Category 3 plan and is following his eating plan approximately 90 % of the time. He states he is doing yard work and fishing. Lynk feels that he is doing well with weight loss on his Category 3 plan. He indulged over [employer], but has gotten back on track. He states that his hunger is controlled and he is still satisfied with his plan.  We were unable to weigh the patient today for this TeleHealth visit. He feels as if he has lost weight since his last visit. He has lost 41 lbs since starting treatment with [place].  Pre-Diabetes Eric [name] has a diagnosis of pre-diabetes based on his elevated Hgb A1c and was informed this puts him at greater risk of developing diabetes. He is stable on metformin currently and doing well on his diet overall. [name] continues to work on diet and exercise to decrease risk of diabetes. He denies nausea, vomiting, or hypoglycemia.   Vitamin D Deficiency Eric [name] has a diagnosis of vitamin D deficiency. He is currently stable on vit D, but is not yet at goal. [name] denies nausea, vomiting, or muscle weakness.  ASSESSMENT AND PLAN:  Prediabetes - Plan: metFORMIN (GLUCOPHAGE) 500 MG tablet  Vitamin D deficiency - Plan: Vitamin D, Ergocalciferol, (DRISDOL) 1.25 MG (50000 UT) CAPS capsule  Class 2 severe obesity with serious comorbidity and body mass index (BMI) of 37.0 to 37.9 in adult, unspecified obesity type [facility])  PLAN:  Pre-Diabetes [name] will continue to work on  weight loss, exercise, and decreasing simple carbohydrates in his diet to help decrease the risk of diabetes. He was informed that eating too many simple carbohydrates or too many calories at one sitting increases the likelihood of GI side effects. [name] agreed to continue metformin 500 mg qAM #30 with no refills and a prescription was written today. [name] agreed to follow up with [place] as directed to monitor his progress in 2 to 3 weeks.   Vitamin D Deficiency Eric [name] was informed that low vitamin D levels contribute to fatigue and are associated with obesity, breast, and colon cancer. Eric [name] agrees to continue to take prescription Vit D @50 ,000 IU every week #4 with no refills and will follow up for routine testing of vitamin D, at least 2-3 times per year. He was informed of the risk of over-replacement of vitamin D and agrees to not increase his dose unless he discusses this with [place] first. [name] agrees to follow up in 2 to 3 weeks as directed.  Obesity Eric [name] is currently in the action stage of change. As such, his goal is to continue with weight loss efforts. He has agreed to follow the Category 3 plan. Eric [name] has been instructed to work up to a goal of 150 minutes of combined cardio and strengthening exercise per week for weight loss and overall health benefits. We discussed the following Behavioral Modification Strategies today: work on meal planning and easy cooking plans, ways to avoid boredom eating, better snacking choices, and ways to avoid night time snacking.  Eric [name]  has agreed to follow up with our clinic in 2 to 3 weeks. He was informed of the importance of frequent follow up visits to maximize his success with intensive lifestyle modifications for his multiple health conditions.  ALLERGIES: Allergies  Allergen Reactions  . Lisinopril Cough    MEDICATIONS: Current Outpatient Medications on File Prior to Visit  Medication Sig Dispense Refill  . aspirin EC 81 MG tablet  Take 81 mg by mouth daily.    [name] atorvastatin (LIPITOR) 40 MG tablet TAKE 1 TABLET DAILY 90 tablet 4  . clobetasol cream (TEMOVATE) 0.05 %     . Coenzyme Q10 (COQ10) 200 MG CAPS Take 200 mg by mouth at bedtime.    . fluocinonide ointment (LIDEX) 3.66 % Apply 1 application topically daily as needed (for bites).    . fluticasone (FLONASE) 50 MCG/ACT nasal spray Place into the nose.    . loratadine (CLARITIN) 10 MG tablet Take 10 mg by mouth daily as needed for allergies.     . meloxicam (MOBIC) 15 MG tablet TAKE 1 TABLET DAILY 90 tablet 4  . metFORMIN (GLUCOPHAGE) 500 MG tablet Take 1 tablet (500 mg total) by mouth daily with breakfast. 30 tablet 0  . mupirocin ointment (BACTROBAN) 2 % Apply 1 application topically 2 (two) times daily as needed (for skin tear/irritation.).    . Naphazoline-Pheniramine 0.027-0.315 % SOLN Apply to eye.    . NONFORMULARY OR COMPOUNDED ITEM apap cpap maching--auto adjusting pressure  dx sleep apnea 1 each 0  . ondansetron (ZOFRAN) 4 MG tablet Take by mouth.    . pantoprazole (PROTONIX) 40 MG tablet TAKE 1 TABLET DAILY 90 tablet 3  . senna-docusate (SENOKOT-S) 8.6-50 MG tablet Take by mouth.    . tamsulosin (FLOMAX) 0.4 MG CAPS capsule Take 0.4 mg by mouth daily 90 capsule 3  . valsartan (DIOVAN) 80 MG tablet TAKE 1 TABLET DAILY 90 tablet 3  . Vitamin D, Ergocalciferol, (DRISDOL) 1.25 MG (50000 UT) CAPS capsule Take 1 capsule (50,000 Units total) by mouth every 7 (seven) days. 4 capsule 0   No current facility-administered medications on file prior to visit.     PAST MEDICAL HISTORY: Past Medical History:  Diagnosis Date  . Alcohol abuse   . Allergy   . Back pain   . Cataract    forming  . Colon polyps   . Diverticulosis of colon   . DJD (degenerative joint disease)   . Dysrhythmia    Palpations from caffiene  . Enlarged prostate   . GERD (gastroesophageal reflux disease)   . Hearing loss   . Hip osteoarthritis    Left  . History of hay fever   . Hx  of cardiovascular stress test    Lexiscan Myoview (04/2013):  No ischemia, EF 61%; low risk  . Hyperlipemia   . Hypertension   . Insulin resistance    om metfromin   . Internal hemorrhoids without mention of complication   . Leg edema   . Neuroma of foot    left  . Obesity   . Osteoarthrosis, unspecified whether generalized or localized, unspecified site   . Palpitations   . 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 Primary localized osteoarthrosis of right shoulder 05/25/2016  . Recent weight loss    75lbs  . Seasonal allergies   . Sleep apnea    CPAP  . Thyroid nodule    calcified thyroid nodules- bx negative   . Tremor of both hands  PAST SURGICAL HISTORY: Past Surgical History:  Procedure Laterality Date  . COLONOSCOPY    . POLYPECTOMY    . SHOULDER SURGERY  Jul 13, 2007   right  . TONSILLECTOMY    . TOTAL HIP ARTHROPLASTY Left 03/28/2017   Procedure: TOTAL HIP ARTHROPLASTY ANTERIOR APPROACH;  Surgeon: Frederik Pear, MD;  Location: [facility];  Service: Orthopedics;  Laterality: Left;  . TOTAL SHOULDER ARTHROPLASTY Left 10/14/2015   Procedure: LEFT TOTAL SHOULDER ARTHROPLASTY;  Surgeon: Marchia Bond, MD;  Location: [facility];  Service: Orthopedics;  Laterality: Left;  . TOTAL SHOULDER ARTHROPLASTY Right 05/25/2016   Procedure: TOTAL SHOULDER ARTHROPLASTY;  Surgeon: Marchia Bond, MD;  Location: [facility];  Service: Orthopedics;  Laterality: Right;    SOCIAL HISTORY: Social History   Tobacco Use  . Smoking status: Never Smoker  . Smokeless tobacco: Never Used  Substance Use Topics  . Alcohol use: Yes    Alcohol/week: 0.0 standard drinks    Comment: rare----12 a year  . Drug use: No    FAMILY HISTORY: Family History  Problem Relation Age of Onset  . Arthritis Mother   . Cancer Mother 34       breast  . Hyperlipidemia Mother   . Colon polyps Mother 21  . Hypertension Mother   . Heart disease Mother   . Depression Mother   . Anxiety disorder Mother   . Obesity  Mother   . Breast cancer Mother   . Heart disease Father        CAD--passed away 2011-07-13 age 35  . Hyperlipidemia Father   . Hypertension Father   . Obesity Father   . Diabetes Father   . Depression Father   . Sleep apnea Father   . Cancer Sister 65       breast  . Breast cancer Sister   . Cancer Brother 13       prostate  . Prostate cancer Brother   . Breast cancer Other   . Prostate cancer Other   . Colon cancer Neg Hx   . Esophageal cancer Neg Hx   . Rectal cancer Neg Hx   . Stomach cancer Neg Hx     ROS: Review of Systems  Gastrointestinal: Negative for nausea and vomiting.  Musculoskeletal:       Negative for muscle weakness.  Endo/Heme/Allergies:       Negative for hypoglycemia.    PHYSICAL EXAM: Pt in no acute distress  RECENT LABS AND TESTS: BMET    Component Value Date/Time   NA 141 03/09/2018 0859   NA 141 12/14/2017 1030   K 4.6 03/09/2018 0859   CL 105 03/09/2018 0859   CO2 27 03/09/2018 0859   GLUCOSE 97 03/09/2018 0859   BUN 26 (H) 03/09/2018 0859   BUN 19 12/14/2017 1030   CREATININE 1.06 03/09/2018 0859   CREATININE 0.93 04/23/2016 1633   CALCIUM 9.4 03/09/2018 0859   GFRNONAA 70 12/14/2017 1030   GFRAA 81 12/14/2017 1030   Lab Results  Component Value Date   HGBA1C 5.4 03/09/2018   HGBA1C 5.7 (H) 12/14/2017   HGBA1C 5.6 03/08/2017   HGBA1C 5.5 11/18/2016   HGBA1C 5.5 03/05/2015   Lab Results  Component Value Date   INSULIN 21.3 12/14/2017   CBC    Component Value Date/Time   WBC 6.8 12/14/2017 1030   WBC 10.9 (H) 03/29/2017 0851   RBC 5.04 12/14/2017 1030   RBC 4.50 03/29/2017 0851   HGB 15.5 12/14/2017 1030   HCT 45.1  12/14/2017 1030   PLT 177 03/29/2017 0851   MCV 90 12/14/2017 1030   MCH 30.8 12/14/2017 1030   MCH 30.2 03/29/2017 0851   MCHC 34.4 12/14/2017 1030   MCHC 31.9 03/29/2017 0851   RDW 13.0 12/14/2017 1030   LYMPHSABS 1.7 12/14/2017 1030   MONOABS 0.5 03/16/2017 0839   EOSABS 0.1 12/14/2017 1030   BASOSABS  0.0 12/14/2017 1030   Iron/TIBC/Ferritin/ %Sat No results found for: IRON, TIBC, FERRITIN, IRONPCTSAT Lipid Panel     Component Value Date/Time   CHOL 115 03/09/2018 0859   CHOL 150 12/14/2017 1030   TRIG 85.0 03/09/2018 0859   HDL 36.30 (L) 03/09/2018 0859   HDL 52 12/14/2017 1030   CHOLHDL 3 03/09/2018 0859   VLDL 17.0 03/09/2018 0859   LDLCALC 62 03/09/2018 0859   LDLCALC 78 12/14/2017 1030   Hepatic Function Panel     Component Value Date/Time   PROT 6.6 03/09/2018 0859   PROT 6.5 12/14/2017 1030   ALBUMIN 4.3 03/09/2018 0859   ALBUMIN 4.5 12/14/2017 1030   AST 12 03/09/2018 0859   ALT 13 03/09/2018 0859   ALKPHOS 58 03/09/2018 0859   BILITOT 0.7 03/09/2018 0859   BILITOT 0.6 12/14/2017 1030   BILIDIR 0.2 06/07/2014 0909      Component Value Date/Time   TSH 1.060 12/14/2017 1030   TSH 0.62 10/03/2015 1230   TSH 0.51 10/08/2010 0859    Results for Hazel, Miron L "MIKE" (MRN [id number]) as of 07/04/2018 09:32  Ref. Range 12/14/2017 10:30  Vitamin D, 25-Hydroxy Latest Ref Range: 30.0 - 100.0 ng/mL 23.2 (L)    I, Marcille Blanco, CMA, am acting as transcriptionist for Starlyn Skeans, MD I have reviewed the above documentation for accuracy and completeness, and I agree with the above. -[name], MD

## 2018-07-06 DIAGNOSIS — M1611 Unilateral primary osteoarthritis, right hip: Secondary | ICD-10-CM | POA: Diagnosis not present

## 2018-07-24 ENCOUNTER — Other Ambulatory Visit: Payer: Self-pay

## 2018-07-24 ENCOUNTER — Ambulatory Visit (INDEPENDENT_AMBULATORY_CARE_PROVIDER_SITE_OTHER): Payer: Medicare Other | Admitting: Family Medicine

## 2018-07-24 ENCOUNTER — Encounter (INDEPENDENT_AMBULATORY_CARE_PROVIDER_SITE_OTHER): Payer: Self-pay | Admitting: Family Medicine

## 2018-07-24 DIAGNOSIS — Z6837 Body mass index (BMI) 37.0-37.9, adult: Secondary | ICD-10-CM

## 2018-07-24 DIAGNOSIS — E559 Vitamin D deficiency, unspecified: Secondary | ICD-10-CM | POA: Diagnosis not present

## 2018-07-24 MED ORDER — VITAMIN D (ERGOCALCIFEROL) 1.25 MG (50000 UNIT) PO CAPS: 50000.0000 [IU] | ORAL_CAPSULE | ORAL | 0 refills | Status: DC

## 2018-07-25 NOTE — Progress Notes (Signed)
Office: [phone]  /  Fax: [phone] TeleHealth Visit:  Eric [name] has verbally consented to this TeleHealth visit today. The patient is located at home, the provider is located at the [facility] office. The participants in this visit include the listed provider,  Patient, and [name]. The visit was conducted today via [email or web address].  HPI:   Chief Complaint: OBESITY Eric [name] is here to discuss his progress with his obesity treatment plan. He is on the Category 3 plan and is following his eating plan approximately 80 to 85 % of the time. He states he is doing yard work and fishing. Eric [name] overindulged this last week, but has done well increasing lean protein and vegetables. He is fishing for activity and cleans and cooks his own fish. He is doing yard work, but walking is still painful for his hip.  We were unable to weigh the patient today for this TeleHealth visit. He feels as if he has maintained weight since his last visit. He has lost 41 lbs since starting treatment with [place].  Vitamin D Deficiency Eric [name] has a diagnosis of vitamin D deficiency. He is currently stable on vit D, but is not yet at goal. Suzanne denies nausea, vomiting, or muscle weakness.  ASSESSMENT AND PLAN:  Vitamin D deficiency - Plan: Vitamin D, Ergocalciferol, (DRISDOL) 1.25 MG (50000 UT) CAPS capsule  Class 2 severe obesity with serious comorbidity and body mass index (BMI) of 37.0 to 37.9 in adult, unspecified obesity type ([place])  PLAN:  Vitamin D Deficiency Eric [name] was informed that low vitamin D levels contribute to fatigue and are associated with obesity, breast, and colon cancer. Eric [name] agrees to continue to take prescription Vit D @50 ,000 IU every week #4 with no refills and will follow up for routine testing of vitamin D, at least 2-3 times per year. He was informed of the risk of over-replacement of vitamin D and agrees to not increase his dose unless he discusses this with [place] first.  Eric [name] agrees to follow up in 2 weeks as directed.  Obesity Jiaire is currently in the action stage of change. As such, his goal is to continue with weight loss efforts. He has agreed to follow the Category 3 plan. Eric [name] has been instructed to work up to a goal of 150 minutes of combined cardio and strengthening exercise per week for weight loss and overall health benefits. We discussed the following Behavioral Modification Strategies today: work on meal planning and easy cooking plans, keeping healthy foods in the home, and better snacking choices.  Eric [name] has agreed to follow up with our clinic in 2 weeks. He was informed of the importance of frequent follow up visits to maximize his success with intensive lifestyle modifications for his multiple health conditions.  ALLERGIES: Allergies  Allergen Reactions  . Lisinopril Cough    MEDICATIONS: Current Outpatient Medications on File Prior to Visit  Medication Sig Dispense Refill  . aspirin EC 81 MG tablet Take 81 mg by mouth daily.    [name] atorvastatin (LIPITOR) 40 MG tablet TAKE 1 TABLET DAILY 90 tablet 4  . clobetasol cream (TEMOVATE) 0.05 %     . Coenzyme Q10 (COQ10) 200 MG CAPS Take 200 mg by mouth at bedtime.    . fluocinonide ointment (LIDEX) 3.81 % Apply 1 application topically daily as needed (for bites).    . fluticasone (FLONASE) 50 MCG/ACT nasal spray Place into the nose.    . loratadine (CLARITIN) 10 MG tablet Take 10 mg by  mouth daily as needed for allergies.     . meloxicam (MOBIC) 15 MG tablet TAKE 1 TABLET DAILY 90 tablet 4  . metFORMIN (GLUCOPHAGE) 500 MG tablet Take 1 tablet (500 mg total) by mouth daily with breakfast. 30 tablet 0  . mupirocin ointment (BACTROBAN) 2 % Apply 1 application topically 2 (two) times daily as needed (for skin tear/irritation.).    . Naphazoline-Pheniramine 0.027-0.315 % SOLN Apply to eye.    . NONFORMULARY OR COMPOUNDED ITEM apap cpap maching--auto adjusting pressure  dx sleep apnea 1  each 0  . ondansetron (ZOFRAN) 4 MG tablet Take by mouth.    . pantoprazole (PROTONIX) 40 MG tablet TAKE 1 TABLET DAILY 90 tablet 3  . senna-docusate (SENOKOT-S) 8.6-50 MG tablet Take by mouth.    . tamsulosin (FLOMAX) 0.4 MG CAPS capsule Take 0.4 mg by mouth daily 90 capsule 3  . valsartan (DIOVAN) 80 MG tablet TAKE 1 TABLET DAILY 90 tablet 3   No current facility-administered medications on file prior to visit.     PAST MEDICAL HISTORY: Past Medical History:  Diagnosis Date  . Alcohol abuse   . Allergy   . Back pain   . Cataract    forming  . Colon polyps   . Diverticulosis of colon   . DJD (degenerative joint disease)   . Dysrhythmia    Palpations from caffiene  . Enlarged prostate   . GERD (gastroesophageal reflux disease)   . Hearing loss   . Hip osteoarthritis    Left  . History of hay fever   . Hx of cardiovascular stress test    Lexiscan Myoview (04/2013):  No ischemia, EF 61%; low risk  . Hyperlipemia   . Hypertension   . Insulin resistance    om metfromin   . Internal hemorrhoids without mention of complication   . Leg edema   . Neuroma of foot    left  . Obesity   . Osteoarthrosis, unspecified whether generalized or localized, unspecified site   . Palpitations   . 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 Primary localized osteoarthrosis of right shoulder 05/25/2016  . Recent weight loss    75lbs  . Seasonal allergies   . Sleep apnea    CPAP  . Thyroid nodule    calcified thyroid nodules- bx negative   . Tremor of both hands     PAST SURGICAL HISTORY: Past Surgical History:  Procedure Laterality Date  . COLONOSCOPY    . POLYPECTOMY    . SHOULDER SURGERY  2009   right  . TONSILLECTOMY    . TOTAL HIP ARTHROPLASTY Left 03/28/2017   Procedure: TOTAL HIP ARTHROPLASTY ANTERIOR APPROACH;  Surgeon: Frederik Pear, MD;  Location: [place];  Service: Orthopedics;  Laterality: Left;  . TOTAL SHOULDER ARTHROPLASTY Left 10/14/2015   Procedure: LEFT  TOTAL SHOULDER ARTHROPLASTY;  Surgeon: Marchia Bond, MD;  Location: [place];  Service: Orthopedics;  Laterality: Left;  . TOTAL SHOULDER ARTHROPLASTY Right 05/25/2016   Procedure: TOTAL SHOULDER ARTHROPLASTY;  Surgeon: Marchia Bond, MD;  Location: [place];  Service: Orthopedics;  Laterality: Right;    SOCIAL HISTORY: Social History   Tobacco Use  . Smoking status: Never Smoker  . Smokeless tobacco: Never Used  Substance Use Topics  . Alcohol use: Yes    Alcohol/week: 0.0 standard drinks    Comment: rare----12 a year  . Drug use: No    FAMILY HISTORY: Family History  Problem Relation Age of Onset  . Arthritis  Mother   . Cancer Mother 17       breast  . Hyperlipidemia Mother   . Colon polyps Mother 63  . Hypertension Mother   . Heart disease Mother   . Depression Mother   . Anxiety disorder Mother   . Obesity Mother   . Breast cancer Mother   . Heart disease Father        CAD--passed away 26-Jun-2011 age 65  . Hyperlipidemia Father   . Hypertension Father   . Obesity Father   . Diabetes Father   . Depression Father   . Sleep apnea Father   . Cancer Sister 30       breast  . Breast cancer Sister   . Cancer Brother 11       prostate  . Prostate cancer Brother   . Breast cancer Other   . Prostate cancer Other   . Colon cancer Neg Hx   . Esophageal cancer Neg Hx   . Rectal cancer Neg Hx   . Stomach cancer Neg Hx     ROS: Review of Systems  Gastrointestinal: Negative for nausea and vomiting.  Musculoskeletal:       Negative for muscle weakness.    PHYSICAL EXAM: Pt in no acute distress  RECENT LABS AND TESTS: BMET    Component Value Date/Time   NA 141 03/09/2018 0859   NA 141 12/14/2017 1030   K 4.6 03/09/2018 0859   CL 105 03/09/2018 0859   CO2 27 03/09/2018 0859   GLUCOSE 97 03/09/2018 0859   BUN 26 (H) 03/09/2018 0859   BUN 19 12/14/2017 1030   CREATININE 1.06 03/09/2018 0859   CREATININE 0.93 04/23/2016 1633   CALCIUM 9.4 03/09/2018 0859   GFRNONAA 70  12/14/2017 1030   GFRAA 81 12/14/2017 1030   Lab Results  Component Value Date   HGBA1C 5.4 03/09/2018   HGBA1C 5.7 (H) 12/14/2017   HGBA1C 5.6 03/08/2017   HGBA1C 5.5 11/18/2016   HGBA1C 5.5 03/05/2015   Lab Results  Component Value Date   INSULIN 21.3 12/14/2017   CBC    Component Value Date/Time   WBC 6.8 12/14/2017 1030   WBC 10.9 (H) 03/29/2017 0851   RBC 5.04 12/14/2017 1030   RBC 4.50 03/29/2017 0851   HGB 15.5 12/14/2017 1030   HCT 45.1 12/14/2017 1030   PLT 177 03/29/2017 0851   MCV 90 12/14/2017 1030   MCH 30.8 12/14/2017 1030   MCH 30.2 03/29/2017 0851   MCHC 34.4 12/14/2017 1030   MCHC 31.9 03/29/2017 0851   RDW 13.0 12/14/2017 1030   LYMPHSABS 1.7 12/14/2017 1030   MONOABS 0.5 03/16/2017 0839   EOSABS 0.1 12/14/2017 1030   BASOSABS 0.0 12/14/2017 1030   Iron/TIBC/Ferritin/ %Sat No results found for: IRON, TIBC, FERRITIN, IRONPCTSAT Lipid Panel     Component Value Date/Time   CHOL 115 03/09/2018 0859   CHOL 150 12/14/2017 1030   TRIG 85.0 03/09/2018 0859   HDL 36.30 (L) 03/09/2018 0859   HDL 52 12/14/2017 1030   CHOLHDL 3 03/09/2018 0859   VLDL 17.0 03/09/2018 0859   LDLCALC 62 03/09/2018 0859   LDLCALC 78 12/14/2017 1030   Hepatic Function Panel     Component Value Date/Time   PROT 6.6 03/09/2018 0859   PROT 6.5 12/14/2017 1030   ALBUMIN 4.3 03/09/2018 0859   ALBUMIN 4.5 12/14/2017 1030   AST 12 03/09/2018 0859   ALT 13 03/09/2018 0859   ALKPHOS 58 03/09/2018 0859  BILITOT 0.7 03/09/2018 0859   BILITOT 0.6 12/14/2017 1030   BILIDIR 0.2 06/07/2014 0909      Component Value Date/Time   TSH 1.060 12/14/2017 1030   TSH 0.62 10/03/2015 1230   TSH 0.51 10/08/2010 0859    Results for Dolata, Romyn L "MIKE" (MRN [id number]) as of 07/25/2018 09:06  Ref. Range 12/14/2017 10:30  Vitamin D, 25-Hydroxy Latest Ref Range: 30.0 - 100.0 ng/mL 23.2 (L)    I, Marcille Blanco, CMA, am acting as transcriptionist for Starlyn Skeans, MD I have  reviewed the above documentation for accuracy and completeness, and I agree with the above. -[name], MD

## 2018-07-31 ENCOUNTER — Other Ambulatory Visit: Payer: Self-pay | Admitting: Family Medicine

## 2018-07-31 DIAGNOSIS — N400 Enlarged prostate without lower urinary tract symptoms: Secondary | ICD-10-CM

## 2018-08-07 ENCOUNTER — Encounter (INDEPENDENT_AMBULATORY_CARE_PROVIDER_SITE_OTHER): Payer: Self-pay | Admitting: Family Medicine

## 2018-08-07 ENCOUNTER — Other Ambulatory Visit: Payer: Self-pay

## 2018-08-07 ENCOUNTER — Ambulatory Visit (INDEPENDENT_AMBULATORY_CARE_PROVIDER_SITE_OTHER): Payer: Medicare Other | Admitting: Family Medicine

## 2018-08-07 DIAGNOSIS — R7303 Prediabetes: Secondary | ICD-10-CM

## 2018-08-07 DIAGNOSIS — E559 Vitamin D deficiency, unspecified: Secondary | ICD-10-CM

## 2018-08-07 DIAGNOSIS — Z6837 Body mass index (BMI) 37.0-37.9, adult: Secondary | ICD-10-CM

## 2018-08-08 MED ORDER — VITAMIN D (ERGOCALCIFEROL) 1.25 MG (50000 UNIT) PO CAPS: 50000.0000 [IU] | ORAL_CAPSULE | ORAL | 0 refills | Status: DC

## 2018-08-08 MED ORDER — METFORMIN HCL 500 MG PO TABS: 500.0000 mg | ORAL_TABLET | Freq: Every day | ORAL | 0 refills | Status: DC

## 2018-08-08 NOTE — Progress Notes (Signed)
Office: [phone]  /  Fax: [phone] TeleHealth Visit:  Eric [name] has verbally consented to this TeleHealth visit today. The patient is located at home, the provider is located at the [employer] [facility] office. The participants in this visit include the listed provider and patient. The visit was conducted today via facetime.  HPI:   Chief Complaint: OBESITY Eric [name] is here to discuss his progress with his obesity treatment plan. He is on the  follow the Category 3 plan and is following his eating plan approximately 95 % of the time. He states he is exercising by doing yardwork and fishing.  Eric [name] continues to do well with his Category 3 plan. His hunger is controlled and he has lost another 3 lbs since his last visit. He has increased his daily activities and keeping busy. He conveys that he has not been doing any boredom eating.  We were unable to weigh the patient today for this TeleHealth visit. He feels as if he has lost weight since his last visit. He has lost 41 lbs since starting treatment with [place].  Pre-Diabetes Eric [name] has a diagnosis of prediabetes based on his elevated HgA1c and was informed this puts him at greater risk of developing diabetes. He is taking metformin currently and continues to work on diet and exercise to decrease risk of diabetes. He continues to do well on prescribed diet.  He denies nausea, vomiting, or hypoglycemia. He reports decreased polyphagia.   Vitamin D deficiency  Eric [name] has a diagnosis of vitamin D deficiency. He is currently taking vit D and denies nausea, vomiting or muscle weakness. Vitamin D level not yet at goal.    ASSESSMENT AND PLAN:  Prediabetes - Plan: metFORMIN (GLUCOPHAGE) 500 MG tablet  Vitamin D deficiency - Plan: Vitamin D, Ergocalciferol, (DRISDOL) 1.25 MG (50000 UT) CAPS capsule  Class 2 severe obesity with serious comorbidity and body mass index (BMI) of 37.0 to 37.9 in adult, unspecified obesity type  Eric [name])  PLAN: Pre-Diabetes Eric [name] will continue to work on weight loss, exercise, and decreasing simple carbohydrates in his diet to help decrease the risk of diabetes. We dicussed metformin including benefits and risks. He was informed that eating too many simple carbohydrates or too many calories at one sitting increases the likelihood of GI side effects. Eric [name] agrees to continue to take Metformin 500 mg daily #30 with no refills.  Eric [name] agreed to follow up with [place] as directed to monitor his progress.  Vitamin D Deficiency Eric [name] was informed that low vitamin D levels contributes to fatigue and are associated with obesity, breast, and colon cancer. He agrees to continue to take prescription Vit D @50 ,000 IU every week #4 with no refills and will follow up for routine testing of vitamin D, at least 2-3 times per year. He was informed of the risk of over-replacement of vitamin D and agrees to not increase his dose unless he discusses this with [place] first. Agrees to follow up with our clinic as directed.   Obesity Eric [name] is currently in the action stage of change. As such, his goal is to continue with weight loss efforts He has agreed to follow the Category 3 plan Eric [name] has been instructed to work up to a goal of 150 minutes of combined cardio and strengthening exercise per week for weight loss and overall health benefits. We discussed the following Behavioral Modification Stratagies today: work on meal planning and easy cooking plans, better snacking choices, and emotional eating strategies  Eric [name] has agreed to follow up with our clinic in 3 weeks. He was informed of the importance of frequent follow up visits to maximize his success with intensive lifestyle modifications for his multiple health conditions.  ALLERGIES: Allergies  Allergen Reactions  . Lisinopril Cough    MEDICATIONS: Current Outpatient Medications on File Prior to Visit  Medication Sig Dispense Refill  . aspirin  EC 81 MG tablet Take 81 mg by mouth daily.    [name] atorvastatin (LIPITOR) 40 MG tablet TAKE 1 TABLET DAILY 90 tablet 4  . clobetasol cream (TEMOVATE) 0.05 %     . Coenzyme Q10 (COQ10) 200 MG CAPS Take 200 mg by mouth at bedtime.    . fluocinonide ointment (LIDEX) 9.51 % Apply 1 application topically daily as needed (for bites).    . fluticasone (FLONASE) 50 MCG/ACT nasal spray Place into the nose.    . loratadine (CLARITIN) 10 MG tablet Take 10 mg by mouth daily as needed for allergies.     . meloxicam (MOBIC) 15 MG tablet TAKE 1 TABLET DAILY 90 tablet 4  . mupirocin ointment (BACTROBAN) 2 % Apply 1 application topically 2 (two) times daily as needed (for skin tear/irritation.).    . Naphazoline-Pheniramine 0.027-0.315 % SOLN Apply to eye.    . NONFORMULARY OR COMPOUNDED ITEM apap cpap maching--auto adjusting pressure  dx sleep apnea 1 each 0  . ondansetron (ZOFRAN) 4 MG tablet Take by mouth.    . pantoprazole (PROTONIX) 40 MG tablet TAKE 1 TABLET DAILY 90 tablet 3  . senna-docusate (SENOKOT-S) 8.6-50 MG tablet Take by mouth.    . tamsulosin (FLOMAX) 0.4 MG CAPS capsule TAKE 1 CAPSULE DAILY 90 capsule 3  . valsartan (DIOVAN) 80 MG tablet TAKE 1 TABLET DAILY 90 tablet 3   No current facility-administered medications on file prior to visit.     PAST MEDICAL HISTORY: Past Medical History:  Diagnosis Date  . Alcohol abuse   . Allergy   . Back pain   . Cataract    forming  . Colon polyps   . Diverticulosis of colon   . DJD (degenerative joint disease)   . Dysrhythmia    Palpations from caffiene  . Enlarged prostate   . GERD (gastroesophageal reflux disease)   . Hearing loss   . Hip osteoarthritis    Left  . History of hay fever   . Hx of cardiovascular stress test    Lexiscan Myoview (04/2013):  No ischemia, EF 61%; low risk  . Hyperlipemia   . Hypertension   . Insulin resistance    om metfromin   . Internal hemorrhoids without mention of complication   . Leg edema   . Neuroma  of foot    left  . Obesity   . Osteoarthrosis, unspecified whether generalized or localized, unspecified site   . Palpitations   . 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 Primary localized osteoarthrosis of right shoulder 05/25/2016  . Recent weight loss    75lbs  . Seasonal allergies   . Sleep apnea    CPAP  . Thyroid nodule    calcified thyroid nodules- bx negative   . Tremor of both hands     PAST SURGICAL HISTORY: Past Surgical History:  Procedure Laterality Date  . COLONOSCOPY    . POLYPECTOMY    . SHOULDER SURGERY  2009   right  . TONSILLECTOMY    . TOTAL HIP ARTHROPLASTY Left 03/28/2017   Procedure: TOTAL HIP ARTHROPLASTY ANTERIOR APPROACH;  Surgeon: Frederik Pear, MD;  Location: [place];  Service: Orthopedics;  Laterality: Left;  . TOTAL SHOULDER ARTHROPLASTY Left 10/14/2015   Procedure: LEFT TOTAL SHOULDER ARTHROPLASTY;  Surgeon: Marchia Bond, MD;  Location: [place];  Service: Orthopedics;  Laterality: Left;  . TOTAL SHOULDER ARTHROPLASTY Right 05/25/2016   Procedure: TOTAL SHOULDER ARTHROPLASTY;  Surgeon: Marchia Bond, MD;  Location: [place];  Service: Orthopedics;  Laterality: Right;    SOCIAL HISTORY: Social History   Tobacco Use  . Smoking status: Never Smoker  . Smokeless tobacco: Never Used  Substance Use Topics  . Alcohol use: Yes    Alcohol/week: 0.0 standard drinks    Comment: rare----12 a year  . Drug use: No    FAMILY HISTORY: Family History  Problem Relation Age of Onset  . Arthritis Mother   . Cancer Mother 63       breast  . Hyperlipidemia Mother   . Colon polyps Mother 38  . Hypertension Mother   . Heart disease Mother   . Depression Mother   . Anxiety disorder Mother   . Obesity Mother   . Breast cancer Mother   . Heart disease Father        CAD--passed away 07/04/11 age 59  . Hyperlipidemia Father   . Hypertension Father   . Obesity Father   . Diabetes Father   . Depression Father   . Sleep apnea Father   . Cancer Sister 51        breast  . Breast cancer Sister   . Cancer Brother 54       prostate  . Prostate cancer Brother   . Breast cancer Other   . Prostate cancer Other   . Colon cancer Neg Hx   . Esophageal cancer Neg Hx   . Rectal cancer Neg Hx   . Stomach cancer Neg Hx     ROS: Review of Systems  Gastrointestinal: Negative for nausea and vomiting.  Musculoskeletal:       Negative for muscle weakness  Endo/Heme/Allergies:       Negative for hypoglycemia Decreased polyphagia    PHYSICAL EXAM: Pt in no acute distress  RECENT LABS AND TESTS: BMET    Component Value Date/Time   NA 141 03/09/2018 0859   NA 141 12/14/2017 1030   K 4.6 03/09/2018 0859   CL 105 03/09/2018 0859   CO2 27 03/09/2018 0859   GLUCOSE 97 03/09/2018 0859   BUN 26 (H) 03/09/2018 0859   BUN 19 12/14/2017 1030   CREATININE 1.06 03/09/2018 0859   CREATININE 0.93 04/23/2016 1633   CALCIUM 9.4 03/09/2018 0859   GFRNONAA 70 12/14/2017 1030   GFRAA 81 12/14/2017 1030   Lab Results  Component Value Date   HGBA1C 5.4 03/09/2018   HGBA1C 5.7 (H) 12/14/2017   HGBA1C 5.6 03/08/2017   HGBA1C 5.5 11/18/2016   HGBA1C 5.5 03/05/2015   Lab Results  Component Value Date   INSULIN 21.3 12/14/2017   CBC    Component Value Date/Time   WBC 6.8 12/14/2017 1030   WBC 10.9 (H) 03/29/2017 0851   RBC 5.04 12/14/2017 1030   RBC 4.50 03/29/2017 0851   HGB 15.5 12/14/2017 1030   HCT 45.1 12/14/2017 1030   PLT 177 03/29/2017 0851   MCV 90 12/14/2017 1030   MCH 30.8 12/14/2017 1030   MCH 30.2 03/29/2017 0851   MCHC 34.4 12/14/2017 1030   MCHC 31.9 03/29/2017 0851   RDW 13.0 12/14/2017 1030   LYMPHSABS 1.7  12/14/2017 1030   MONOABS 0.5 03/16/2017 0839   EOSABS 0.1 12/14/2017 1030   BASOSABS 0.0 12/14/2017 1030   Iron/TIBC/Ferritin/ %Sat No results found for: IRON, TIBC, FERRITIN, IRONPCTSAT Lipid Panel     Component Value Date/Time   CHOL 115 03/09/2018 0859   CHOL 150 12/14/2017 1030   TRIG 85.0 03/09/2018 0859    HDL 36.30 (L) 03/09/2018 0859   HDL 52 12/14/2017 1030   CHOLHDL 3 03/09/2018 0859   VLDL 17.0 03/09/2018 0859   LDLCALC 62 03/09/2018 0859   LDLCALC 78 12/14/2017 1030   Hepatic Function Panel     Component Value Date/Time   PROT 6.6 03/09/2018 0859   PROT 6.5 12/14/2017 1030   ALBUMIN 4.3 03/09/2018 0859   ALBUMIN 4.5 12/14/2017 1030   AST 12 03/09/2018 0859   ALT 13 03/09/2018 0859   ALKPHOS 58 03/09/2018 0859   BILITOT 0.7 03/09/2018 0859   BILITOT 0.6 12/14/2017 1030   BILIDIR 0.2 06/07/2014 0909      Component Value Date/Time   TSH 1.060 12/14/2017 1030   TSH 0.62 10/03/2015 1230   TSH 0.51 10/08/2010 0859     Ref. Range 12/14/2017 10:30  Vitamin D, 25-Hydroxy Latest Ref Range: 30.0 - 100.0 ng/mL 23.2 (L)     I, Renee Ramus, am acting as Location manager for [name], MD  I have reviewed the above documentation for accuracy and completeness, and I agree with the above. -[name], MD

## 2018-08-23 ENCOUNTER — Other Ambulatory Visit: Payer: Self-pay | Admitting: Orthopedic Surgery

## 2018-08-23 DIAGNOSIS — E119 Type 2 diabetes mellitus without complications: Secondary | ICD-10-CM | POA: Diagnosis not present

## 2018-08-23 DIAGNOSIS — H524 Presbyopia: Secondary | ICD-10-CM | POA: Diagnosis not present

## 2018-08-23 DIAGNOSIS — H5203 Hypermetropia, bilateral: Secondary | ICD-10-CM | POA: Diagnosis not present

## 2018-08-23 DIAGNOSIS — H2513 Age-related nuclear cataract, bilateral: Secondary | ICD-10-CM | POA: Diagnosis not present

## 2018-08-23 DIAGNOSIS — Z7984 Long term (current) use of oral hypoglycemic drugs: Secondary | ICD-10-CM | POA: Diagnosis not present

## 2018-08-23 DIAGNOSIS — R7303 Prediabetes: Secondary | ICD-10-CM | POA: Diagnosis not present

## 2018-08-23 DIAGNOSIS — H52202 Unspecified astigmatism, left eye: Secondary | ICD-10-CM | POA: Diagnosis not present

## 2018-08-28 ENCOUNTER — Other Ambulatory Visit: Payer: Self-pay

## 2018-08-28 ENCOUNTER — Ambulatory Visit (INDEPENDENT_AMBULATORY_CARE_PROVIDER_SITE_OTHER): Payer: Medicare Other | Admitting: Family Medicine

## 2018-08-28 DIAGNOSIS — Z6837 Body mass index (BMI) 37.0-37.9, adult: Secondary | ICD-10-CM

## 2018-08-28 DIAGNOSIS — E559 Vitamin D deficiency, unspecified: Secondary | ICD-10-CM

## 2018-08-28 DIAGNOSIS — R7303 Prediabetes: Secondary | ICD-10-CM

## 2018-08-29 ENCOUNTER — Encounter: Payer: Self-pay | Admitting: Family Medicine

## 2018-08-29 ENCOUNTER — Ambulatory Visit (INDEPENDENT_AMBULATORY_CARE_PROVIDER_SITE_OTHER): Payer: Medicare Other | Admitting: Family Medicine

## 2018-08-29 ENCOUNTER — Other Ambulatory Visit: Payer: Self-pay

## 2018-08-29 ENCOUNTER — Telehealth: Payer: Self-pay | Admitting: Family Medicine

## 2018-08-29 VITALS — BP 119/57 | HR 46 | Temp 98.5°F | Resp 12 | Ht 68.0 in | Wt 245.6 lb

## 2018-08-29 DIAGNOSIS — K219 Gastro-esophageal reflux disease without esophagitis: Secondary | ICD-10-CM

## 2018-08-29 DIAGNOSIS — M1611 Unilateral primary osteoarthritis, right hip: Secondary | ICD-10-CM | POA: Diagnosis not present

## 2018-08-29 DIAGNOSIS — E785 Hyperlipidemia, unspecified: Secondary | ICD-10-CM | POA: Diagnosis not present

## 2018-08-29 DIAGNOSIS — E559 Vitamin D deficiency, unspecified: Secondary | ICD-10-CM

## 2018-08-29 DIAGNOSIS — I10 Essential (primary) hypertension: Secondary | ICD-10-CM | POA: Diagnosis not present

## 2018-08-29 DIAGNOSIS — R7303 Prediabetes: Secondary | ICD-10-CM | POA: Diagnosis not present

## 2018-08-29 LAB — COMPREHENSIVE METABOLIC PANEL
ALT: 12 U/L (ref 0–53)
AST: 10 U/L (ref 0–37)
Albumin: 4.1 g/dL (ref 3.5–5.2)
Alkaline Phosphatase: 58 U/L (ref 39–117)
BUN: 24 mg/dL — ABNORMAL HIGH (ref 6–23)
CO2: 28 mEq/L (ref 19–32)
Calcium: 9.2 mg/dL (ref 8.4–10.5)
Chloride: 107 mEq/L (ref 96–112)
Creatinine, Ser: 0.93 mg/dL (ref 0.40–1.50)
GFR: 79.79 mL/min (ref >60.00)
Glucose, Bld: 95 mg/dL (ref 70–99)
Potassium: 5 mEq/L (ref 3.5–5.1)
Sodium: 141 mEq/L (ref 135–145)
Total Bilirubin: 0.4 mg/dL (ref 0.2–1.2)
Total Protein: 6 g/dL (ref 6.0–8.3)

## 2018-08-29 LAB — LIPID PANEL
Cholesterol: 120 mg/dL (ref 0–200)
HDL: 39.7 mg/dL (ref >39.00)
LDL Cholesterol: 70 mg/dL (ref 0–99)
NonHDL: 80.3
Total CHOL/HDL Ratio: 3
Triglycerides: 50 mg/dL (ref 0.0–149.0)
VLDL: 10 mg/dL (ref 0.0–40.0)

## 2018-08-29 LAB — VITAMIN D 25 HYDROXY (VIT D DEFICIENCY, FRACTURES): VITD: 49.95 ng/mL (ref 30.00–100.00)

## 2018-08-29 LAB — HEMOGLOBIN A1C: Hgb A1c MFr Bld: 5.6 % (ref 4.6–6.5)

## 2018-08-29 MED ORDER — PANTOPRAZOLE SODIUM 40 MG PO TBEC: 40.0000 mg | DELAYED_RELEASE_TABLET | Freq: Every day | ORAL | 3 refills | Status: DC

## 2018-08-29 MED ORDER — METFORMIN HCL 500 MG PO TABS: 500.0000 mg | ORAL_TABLET | Freq: Every day | ORAL | 0 refills | Status: DC

## 2018-08-29 MED ORDER — FLUOCINONIDE 0.05 % EX OINT: TOPICAL_OINTMENT | CUTANEOUS | 3 refills | Status: DC

## 2018-08-29 MED ORDER — FLUOCINONIDE 0.05 % EX OINT: TOPICAL_OINTMENT | CUTANEOUS | 3 refills | Status: AC

## 2018-08-29 MED ORDER — VITAMIN D (ERGOCALCIFEROL) 1.25 MG (50000 UNIT) PO CAPS: 50000.0000 [IU] | ORAL_CAPSULE | ORAL | 0 refills | Status: DC

## 2018-08-29 NOTE — Patient Instructions (Signed)

## 2018-08-29 NOTE — Assessment & Plan Note (Signed)
Pt scheduled for surgery in early July

## 2018-08-29 NOTE — Assessment & Plan Note (Signed)
[facility] 

## 2018-08-29 NOTE — Assessment & Plan Note (Signed)
Encouraged heart healthy diet, increase exercise, avoid trans fats, consider a krill oil cap daily 

## 2018-08-29 NOTE — Progress Notes (Signed)
Office: [phone]  /  Fax: [phone] TeleHealth Visit:  Eric [name] has verbally consented to this TeleHealth visit today. The patient is located at home, the provider is located at the [facility] office. The participants in this visit include the listed provider and patient and any and all parties. The visit was conducted today via FaceTime.  HPI:   Chief Complaint: OBESITY Eric [name] is here to discuss his progress with his obesity treatment plan. He is on the Category 3 plan and is following his eating plan approximately 90 % of the time. He states he is doing yard work for exercise. Eric [name] continues to do well on his eating plan and he thinks he has lost another 2 pounds since his last visit. His hunger is controlled and he is happy with his plan. Eric [name] is getting ready to go on vacation and he has questions about how to eat better while traveling. We were unable to weigh the patient today for this TeleHealth visit. He feels as if he has lost weight since his last visit. He has lost 50 lbs since starting treatment with [place].  Vitamin D deficiency Eric [name] has a diagnosis of vitamin D deficiency. He is stable on vit D and denies nausea, vomiting or muscle weakness.  Pre-Diabetes Eric [name] has a diagnosis of prediabetes based on his elevated Hgb A1c and was informed this puts him at greater risk of developing diabetes. He continues to do very well with his diet and weight loss efforts. Eric [name] is stable on metformin and he denies nausea, vomiting or hypoglycemia. Eric [name] continues to work on diet and exercise to decrease risk of diabetes. He has decreased polyphagia.  ASSESSMENT AND PLAN:  Vitamin D deficiency - Plan: Vitamin D, Ergocalciferol, (DRISDOL) 1.25 MG (50000 UT) CAPS capsule  Prediabetes - Plan: metFORMIN (GLUCOPHAGE) 500 MG tablet  Class 2 severe obesity with serious comorbidity and body mass index (BMI) of 37.0 to 37.9 in adult, unspecified obesity  type ([place])  PLAN:  Vitamin D Deficiency Eric [name] was informed that low vitamin D levels contributes to fatigue and are associated with obesity, breast, and colon cancer. He agrees to continue to take prescription Vit D @50 ,000 IU every week #4 with no refills and will follow up for routine testing of vitamin D, at least 2-3 times per year. He was informed of the risk of over-replacement of vitamin D and agrees to not increase his dose unless he discusses this with [place] first. Eric [name] agrees to follow up as directed.  Pre-Diabetes Eric [name] will continue to work on weight loss, exercise, and decreasing simple carbohydrates in his diet to help decrease the risk of diabetes. We dicussed metformin including benefits and risks. He was informed that eating too many simple carbohydrates or too many calories at one sitting increases the likelihood of GI side effects. Eric [name] agrees to continue metformin 500 mg daily with breakfast #30 with no refills and follow up with [place] as directed to monitor his progress.  Obesity Eric [name] is currently in the action stage of change. As such, his goal is to continue with weight loss efforts He has agreed to follow the Category 3 plan Eric [name] has been instructed to work up to a goal of 150 minutes of combined cardio and strengthening exercise per week for weight loss and overall health benefits. We discussed the following Behavioral Modification Strategies today: increasing lean protein intake, increasing vegetables, travel eating strategies, holiday eating strategies and celebration eating strategies  Eric [name] has agreed to  follow up with our clinic in 2 weeks. He was informed of the importance of frequent follow up visits to maximize his success with intensive lifestyle modifications for his multiple health conditions.  ALLERGIES: Allergies  Allergen Reactions  . Lisinopril Cough    MEDICATIONS: Current Outpatient Medications on File Prior to Visit  Medication Sig  Dispense Refill  . aspirin EC 81 MG tablet Take 81 mg by mouth daily.    [name] atorvastatin (LIPITOR) 40 MG tablet TAKE 1 TABLET DAILY 90 tablet 4  . Coenzyme Q10 (COQ10) 200 MG CAPS Take 200 mg by mouth at bedtime.    . fluticasone (FLONASE) 50 MCG/ACT nasal spray Place into the nose.    . loratadine (CLARITIN) 10 MG tablet Take 10 mg by mouth daily as needed for allergies.     . meloxicam (MOBIC) 15 MG tablet TAKE 1 TABLET DAILY 90 tablet 4  . NONFORMULARY OR COMPOUNDED ITEM apap cpap maching--auto adjusting pressure  dx sleep apnea 1 each 0  . tamsulosin (FLOMAX) 0.4 MG CAPS capsule TAKE 1 CAPSULE DAILY 90 capsule 3  . valsartan (DIOVAN) 80 MG tablet TAKE 1 TABLET DAILY 90 tablet 3   No current facility-administered medications on file prior to visit.     PAST MEDICAL HISTORY: Past Medical History:  Diagnosis Date  . Alcohol abuse   . Allergy   . Back pain   . Cataract    forming  . Colon polyps   . Diverticulosis of colon   . DJD (degenerative joint disease)   . Dysrhythmia    Palpations from caffiene  . Enlarged prostate   . GERD (gastroesophageal reflux disease)   . Hearing loss   . Hip osteoarthritis    Left  . History of hay fever   . Hx of cardiovascular stress test    Lexiscan Myoview (04/2013):  No ischemia, EF 61%; low risk  . Hyperlipemia   . Hypertension   . Insulin resistance    om metfromin   . Internal hemorrhoids without mention of complication   . Leg edema   . Neuroma of foot    left  . Obesity   . Osteoarthrosis, unspecified whether generalized or localized, unspecified site   . Palpitations   . 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 Primary localized osteoarthrosis of right shoulder 05/25/2016  . Recent weight loss    75lbs  . Seasonal allergies   . Sleep apnea    CPAP  . Thyroid nodule    calcified thyroid nodules- bx negative   . Tremor of both hands     PAST SURGICAL HISTORY: Past Surgical History:  Procedure Laterality  Date  . COLONOSCOPY    . POLYPECTOMY    . SHOULDER SURGERY  2009   right  . TONSILLECTOMY    . TOTAL HIP ARTHROPLASTY Left 03/28/2017   Procedure: TOTAL HIP ARTHROPLASTY ANTERIOR APPROACH;  Surgeon: Frederik Pear, MD;  Location: [facility];  Service: Orthopedics;  Laterality: Left;  . TOTAL SHOULDER ARTHROPLASTY Left 10/14/2015   Procedure: LEFT TOTAL SHOULDER ARTHROPLASTY;  Surgeon: Marchia Bond, MD;  Location: [facility];  Service: Orthopedics;  Laterality: Left;  . TOTAL SHOULDER ARTHROPLASTY Right 05/25/2016   Procedure: TOTAL SHOULDER ARTHROPLASTY;  Surgeon: Marchia Bond, MD;  Location: [facility];  Service: Orthopedics;  Laterality: Right;    SOCIAL HISTORY: Social History   Tobacco Use  . Smoking status: Never Smoker  . Smokeless tobacco: Never Used  Substance Use Topics  . Alcohol use:  Yes    Alcohol/week: 0.0 standard drinks    Comment: rare----12 a year  . Drug use: No    FAMILY HISTORY: Family History  Problem Relation Age of Onset  . Arthritis Mother   . Cancer Mother 74       breast  . Hyperlipidemia Mother   . Colon polyps Mother 23  . Hypertension Mother   . Heart disease Mother   . Depression Mother   . Anxiety disorder Mother   . Obesity Mother   . Breast cancer Mother   . Heart disease Father        CAD--passed away 06/24/11 age 58  . Hyperlipidemia Father   . Hypertension Father   . Obesity Father   . Diabetes Father   . Depression Father   . Sleep apnea Father   . Cancer Sister 62       breast  . Breast cancer Sister   . Cancer Brother 70       prostate  . Prostate cancer Brother   . Breast cancer Other   . Prostate cancer Other   . Colon cancer Neg Hx   . Esophageal cancer Neg Hx   . Rectal cancer Neg Hx   . Stomach cancer Neg Hx     ROS: Review of Systems  Constitutional: Positive for weight loss.  Gastrointestinal: Negative for nausea and vomiting.  Musculoskeletal:       Negative for muscle weakness  Endo/Heme/Allergies:       Positive for  polyphagia Negative for hypoglycemia    PHYSICAL EXAM: Pt in no acute distress  RECENT LABS AND TESTS: BMET    Component Value Date/Time   NA 141 03/09/2018 0859   NA 141 12/14/2017 1030   K 4.6 03/09/2018 0859   CL 105 03/09/2018 0859   CO2 27 03/09/2018 0859   GLUCOSE 97 03/09/2018 0859   BUN 26 (H) 03/09/2018 0859   BUN 19 12/14/2017 1030   CREATININE 1.06 03/09/2018 0859   CREATININE 0.93 04/23/2016 1633   CALCIUM 9.4 03/09/2018 0859   GFRNONAA 70 12/14/2017 1030   GFRAA 81 12/14/2017 1030   Lab Results  Component Value Date   HGBA1C 5.4 03/09/2018   HGBA1C 5.7 (H) 12/14/2017   HGBA1C 5.6 03/08/2017   HGBA1C 5.5 11/18/2016   HGBA1C 5.5 03/05/2015   Lab Results  Component Value Date   INSULIN 21.3 12/14/2017   CBC    Component Value Date/Time   WBC 6.8 12/14/2017 1030   WBC 10.9 (H) 03/29/2017 0851   RBC 5.04 12/14/2017 1030   RBC 4.50 03/29/2017 0851   HGB 15.5 12/14/2017 1030   HCT 45.1 12/14/2017 1030   PLT 177 03/29/2017 0851   MCV 90 12/14/2017 1030   MCH 30.8 12/14/2017 1030   MCH 30.2 03/29/2017 0851   MCHC 34.4 12/14/2017 1030   MCHC 31.9 03/29/2017 0851   RDW 13.0 12/14/2017 1030   LYMPHSABS 1.7 12/14/2017 1030   MONOABS 0.5 03/16/2017 0839   EOSABS 0.1 12/14/2017 1030   BASOSABS 0.0 12/14/2017 1030   Iron/TIBC/Ferritin/ %Sat No results found for: IRON, TIBC, FERRITIN, IRONPCTSAT Lipid Panel     Component Value Date/Time   CHOL 115 03/09/2018 0859   CHOL 150 12/14/2017 1030   TRIG 85.0 03/09/2018 0859   HDL 36.30 (L) 03/09/2018 0859   HDL 52 12/14/2017 1030   CHOLHDL 3 03/09/2018 0859   VLDL 17.0 03/09/2018 0859   LDLCALC 62 03/09/2018 0859   LDLCALC 78 12/14/2017  1030   Hepatic Function Panel     Component Value Date/Time   PROT 6.6 03/09/2018 0859   PROT 6.5 12/14/2017 1030   ALBUMIN 4.3 03/09/2018 0859   ALBUMIN 4.5 12/14/2017 1030   AST 12 03/09/2018 0859   ALT 13 03/09/2018 0859   ALKPHOS 58 03/09/2018 0859   BILITOT  0.7 03/09/2018 0859   BILITOT 0.6 12/14/2017 1030   BILIDIR 0.2 06/07/2014 0909      Component Value Date/Time   TSH 1.060 12/14/2017 1030   TSH 0.62 10/03/2015 1230   TSH 0.51 10/08/2010 0859     Ref. Range 12/14/2017 10:30  Vitamin D, 25-Hydroxy Latest Ref Range: 30.0 - 100.0 ng/mL 23.2 (L)    I, Doreene Nest, am acting as transcriptionist for [name], MD I have reviewed the above documentation for accuracy and completeness, and I agree with the above. -[name], MD

## 2018-08-29 NOTE — Telephone Encounter (Signed)
Resent Rx to preferred pharamacy

## 2018-08-29 NOTE — Progress Notes (Signed)
Patient ID: Eric [name], male    DOB: 08/20/1945  Age: 73 y.o. MRN: [id number]    Subjective:  Subjective  HPI YAMIR CARIGNAN presents for f/u He has hip surgery on July 6  Healthy weight and wellness put him on metformin and vita d -- needs labs done He went to derm and had bx of rash on leg--- dx morphia Review of Systems  Constitutional: Negative for appetite change, diaphoresis, fatigue and unexpected weight change.  Eyes: Negative for pain, redness and visual disturbance.  Respiratory: Negative for cough, chest tightness, shortness of breath and wheezing.   Cardiovascular: Negative for chest pain, palpitations and leg swelling.  Endocrine: Negative for cold intolerance, heat intolerance, polydipsia, polyphagia and polyuria.  Genitourinary: Negative for difficulty urinating, dysuria and frequency.  Musculoskeletal: Positive for arthralgias and gait problem.  Neurological: Negative for dizziness, light-headedness, numbness and headaches.    History Past Medical History:  Diagnosis Date  . Alcohol abuse   . Allergy   . Back pain   . Cataract    forming  . Colon polyps   . Diverticulosis of colon   . DJD (degenerative joint disease)   . Dysrhythmia    Palpations from caffiene  . Enlarged prostate   . GERD (gastroesophageal reflux disease)   . Hearing loss   . Hip osteoarthritis    Left  . History of hay fever   . Hx of cardiovascular stress test    Lexiscan Myoview (04/2013):  No ischemia, EF 61%; low risk  . Hyperlipemia   . Hypertension   . Insulin resistance    om metfromin   . Internal hemorrhoids without mention of complication   . Leg edema   . Neuroma of foot    left  . Obesity   . Osteoarthrosis, unspecified whether generalized or localized, unspecified site   . Palpitations   . 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 Primary localized osteoarthrosis of right shoulder 05/25/2016  . Recent weight loss    75lbs  . Seasonal allergies    . Sleep apnea    CPAP  . Thyroid nodule    calcified thyroid nodules- bx negative   . Tremor of both hands     He has a past surgical history that includes Shoulder surgery (2009); Tonsillectomy; Colonoscopy; Total shoulder arthroplasty (Left, 10/14/2015); Total shoulder arthroplasty (Right, 05/25/2016); Total hip arthroplasty (Left, 03/28/2017); and Polypectomy.   His family history includes Anxiety disorder in his mother; Arthritis in his mother; Breast cancer in his mother, sister, and another family member; Cancer (age of onset: 5) in his sister; Cancer (age of onset: 61) in his mother; Cancer (age of onset: 14) in his brother; Colon polyps (age of onset: 74) in his mother; Depression in his father and mother; Diabetes in his father; Heart disease in his father and mother; Hyperlipidemia in his father and mother; Hypertension in his father and mother; Obesity in his father and mother; Prostate cancer in his brother and another family member; Sleep apnea in his father.He reports that he has never smoked. He has never used smokeless tobacco. He reports current alcohol use. He reports that he does not use drugs.  Current Outpatient Medications on File Prior to Visit  Medication Sig Dispense Refill  . aspirin EC 81 MG tablet Take 81 mg by mouth daily.    [name] atorvastatin (LIPITOR) 40 MG tablet TAKE 1 TABLET DAILY 90 tablet 4  . Coenzyme Q10 (COQ10) 200 MG CAPS Take 200  mg by mouth at bedtime.    . fluticasone (FLONASE) 50 MCG/ACT nasal spray Place into the nose.    . loratadine (CLARITIN) 10 MG tablet Take 10 mg by mouth daily as needed for allergies.     . meloxicam (MOBIC) 15 MG tablet TAKE 1 TABLET DAILY 90 tablet 4  . metFORMIN (GLUCOPHAGE) 500 MG tablet Take 1 tablet (500 mg total) by mouth daily with breakfast. 30 tablet 0  . NONFORMULARY OR COMPOUNDED ITEM apap cpap maching--auto adjusting pressure  dx sleep apnea 1 each 0  . tamsulosin (FLOMAX) 0.4 MG CAPS capsule TAKE 1 CAPSULE DAILY 90  capsule 3  . valsartan (DIOVAN) 80 MG tablet TAKE 1 TABLET DAILY 90 tablet 3  . Vitamin D, Ergocalciferol, (DRISDOL) 1.25 MG (50000 UT) CAPS capsule Take 1 capsule (50,000 Units total) by mouth every 7 (seven) days. 4 capsule 0   No current facility-administered medications on file prior to visit.      Objective:  Objective  Physical Exam Vitals signs and nursing note reviewed.  Constitutional:      General: He is sleeping.     Appearance: He is well-developed.  HENT:     Head: Normocephalic and atraumatic.  Eyes:     Pupils: Pupils are equal, round, and reactive to light.  Neck:     Musculoskeletal: Normal range of motion and neck supple.     Thyroid: No thyromegaly.  Cardiovascular:     Rate and Rhythm: Normal rate and regular rhythm.     Heart sounds: No murmur.  Pulmonary:     Effort: Pulmonary effort is normal. No respiratory distress.     Breath sounds: Normal breath sounds. No wheezing or rales.  Chest:     Chest wall: No tenderness.  Musculoskeletal:        General: Tenderness present.  Skin:    General: Skin is warm and dry.  Neurological:     Mental Status: He is oriented to person, place, and time.  Psychiatric:        Behavior: Behavior normal.        Thought Content: Thought content normal.        Judgment: Judgment normal.    BP (!) 119/57   Pulse (!) 46   Temp 98.5 F (36.9 C) (Oral)   Resp 12   Ht 5\' 8"  (1.727 m)   Wt 245 lb 9.6 oz (111.4 kg)   SpO2 100%   BMI 37.34 kg/m  Wt Readings from Last 3 Encounters:  08/29/18 245 lb 9.6 oz (111.4 kg)  06/06/18 247 lb (112 kg)  05/04/18 252 lb (114.3 kg)     Lab Results  Component Value Date   WBC 6.8 12/14/2017   HGB 15.5 12/14/2017   HCT 45.1 12/14/2017   PLT 177 03/29/2017   GLUCOSE 97 03/09/2018   CHOL 115 03/09/2018   TRIG 85.0 03/09/2018   HDL 36.30 (L) 03/09/2018   LDLCALC 62 03/09/2018   ALT 13 03/09/2018   AST 12 03/09/2018   NA 141 03/09/2018   K 4.6 03/09/2018   CL 105  03/09/2018   CREATININE 1.06 03/09/2018   BUN 26 (H) 03/09/2018   CO2 27 03/09/2018   TSH 1.060 12/14/2017   PSA 1.08 03/09/2018   INR 0.96 03/16/2017   HGBA1C 5.4 03/09/2018   MICROALBUR 1.5 12/25/2012    Dg Chest 2 View  Result Date: 03/16/2017 CLINICAL DATA:  Preoperative examination prior to anterior approach hip arthroplasty. EXAM: CHEST  2  VIEW COMPARISON:  No recent studies in [facility] FINDINGS: The lungs are well-expanded and clear. The heart and pulmonary vascularity are normal. The mediastinum is normal in width. The trachea is midline. There is no pleural effusion. There are prosthetic shoulder joints bilaterally. IMPRESSION: There is no active cardiopulmonary disease. Electronically Signed   By: David  Martinique M.D.   On: 03/16/2017 09:07     Assessment & Plan:  Plan  I have discontinued Evian L. [place] "Mike"'s clobetasol cream, mupirocin ointment, Naphazoline-Pheniramine, ondansetron, and senna-docusate. I am also having him maintain his loratadine, NONFORMULARY OR COMPOUNDED ITEM, CoQ10, meloxicam, aspirin EC, atorvastatin, fluticasone, valsartan, tamsulosin, Vitamin D (Ergocalciferol), metFORMIN, fluocinonide ointment, and pantoprazole.  Meds ordered this encounter  Medications  . fluocinonide ointment (LIDEX) 0.05 %    Sig: Apply 1 application topically daily as needed (for bites).    Dispense:  30 g    Refill:  3  . DISCONTD: pantoprazole (PROTONIX) 40 MG tablet    Sig: Take 1 tablet (40 mg total) by mouth daily.    Dispense:  90 tablet    Refill:  3  . pantoprazole (PROTONIX) 40 MG tablet    Sig: Take 1 tablet (40 mg total) by mouth daily.    Dispense:  90 tablet    Refill:  3    Problem List Items Addressed This Visit      Unprioritized   Essential hypertension - Primary    Well controlled, no changes to meds. Encouraged heart healthy diet such as the DASH diet and exercise as tolerated.        Relevant Orders   Lipid panel   Comprehensive metabolic  panel   GERD    Stable con't meds       Relevant Medications   pantoprazole (PROTONIX) 40 MG tablet   Hyperlipidemia    Encouraged heart healthy diet, increase exercise, avoid trans fats, consider a krill oil cap daily       Relevant Orders   Lipid panel   Comprehensive metabolic panel   Prediabetes   Relevant Orders   Hemoglobin A1c   Comprehensive metabolic panel   Insulin, random   Primary osteoarthritis of right hip    Pt scheduled for surgery in early July      Severe obesity (BMI >= 40) (HCC)    con't healthy weight and wellness       Vitamin D deficiency   Relevant Orders   Vitamin D (25 hydroxy)    Other Visit Diagnoses    Morbid obesity ([place])       Relevant Orders   Lipid panel   Hemoglobin A1c   Comprehensive metabolic panel   Vitamin D (25 hydroxy)   Insulin, random      Follow-up: Return in about 6 months (around 02/28/2019) for annual exam, fasting.  [name], DO

## 2018-08-29 NOTE — Telephone Encounter (Signed)
Copied from [place] [id number]. Topic: Quick Communication - Rx Refill/Question >> Aug 29, 2018 11:49 AM Virl Axe D wrote: Medication: fluocinonide ointment (LIDEX) 0.05 % / [facility] stated the pt requested that rx be sent to [facility] instead. Please advise.  Has the patient contacted their pharmacy? Yes.   (Agent: If no, request that the patient contact the pharmacy for the refill.) (Agent: If yes, when and what did the pharmacy advise?)  Preferred Pharmacy (with phone number or street name): [facility], [address] [place] [place] [phone] (Phone) [phone] (Fax)    Agent: Please be advised that RX refills may take up to 3 business days. We ask that you follow-up with your pharmacy.

## 2018-08-29 NOTE — Assessment & Plan Note (Signed)
Stable con't meds 

## 2018-08-29 NOTE — Assessment & Plan Note (Signed)
Well controlled, no changes to meds. Encouraged heart healthy diet such as the DASH diet and exercise as tolerated.  °

## 2018-08-30 LAB — INSULIN, RANDOM: Insulin: 9.8 u[IU]/mL

## 2018-09-06 NOTE — Progress Notes (Signed)
Pt viewed Mychart. Labs faxed to [facility].

## 2018-09-07 ENCOUNTER — Ambulatory Visit: Payer: Medicare Other | Admitting: Family Medicine

## 2018-09-13 ENCOUNTER — Other Ambulatory Visit: Payer: Self-pay

## 2018-09-13 ENCOUNTER — Encounter (INDEPENDENT_AMBULATORY_CARE_PROVIDER_SITE_OTHER): Payer: Self-pay | Admitting: Family Medicine

## 2018-09-13 ENCOUNTER — Other Ambulatory Visit: Payer: Self-pay | Admitting: Orthopedic Surgery

## 2018-09-13 ENCOUNTER — Telehealth (INDEPENDENT_AMBULATORY_CARE_PROVIDER_SITE_OTHER): Payer: Medicare Other | Admitting: Family Medicine

## 2018-09-13 DIAGNOSIS — Z6837 Body mass index (BMI) 37.0-37.9, adult: Secondary | ICD-10-CM | POA: Diagnosis not present

## 2018-09-13 DIAGNOSIS — E559 Vitamin D deficiency, unspecified: Secondary | ICD-10-CM

## 2018-09-13 DIAGNOSIS — I10 Essential (primary) hypertension: Secondary | ICD-10-CM

## 2018-09-13 MED ORDER — VITAMIN D (ERGOCALCIFEROL) 1.25 MG (50000 UNIT) PO CAPS: 50000.0000 [IU] | ORAL_CAPSULE | ORAL | 0 refills | Status: DC

## 2018-09-13 NOTE — Care Plan (Signed)
Patient known to me from previous surgeries. Spoke with him prior to surgery and he will discharge to home with family and go directly to [facility]. This has been arranged at [facility]. He has equipment from prior surgery.  Patient and MD aware of plan and agreeable.  Choice offered.   Eric [name], [place]

## 2018-09-14 NOTE — Progress Notes (Signed)
Office: [phone]  /  Fax: [phone] TeleHealth Visit:  Eric [name] has verbally consented to this TeleHealth visit today. The patient is located at home, the provider is located at the [facility] office. The participants in this visit include the listed provider and patient. The visit was conducted today via Face Time.  HPI:   Chief Complaint: OBESITY Eric [name] is here to discuss his progress with his obesity treatment plan. He is on the Category 3 plan and is following his eating plan approximately 50 % of the time. He states he has been doing yard work. Eric [name] went on vacation to [facility] and increased eating out and celebration eating. He has gained 2 pounds, but is working on getting back on track.  We were unable to weigh the patient today for this TeleHealth visit. He feels as if he has gained weight since his last visit. He has lost 41 lbs since starting treatment with [place].  Hypertension Eric [name] is a 73 y.o. male with hypertension. Eric [name]'s blood pressure today at home was 114/60 and is currently well controlled on medications. He is working on weight loss to help control his blood pressure with the goal of decreasing his risk of heart attack and stroke. Eric [name] denies chest pain, headache, or dizziness.  Vitamin D Deficiency Eric [name] has a diagnosis of vitamin D deficiency. He is currently stable on prescription vit D, but is not yet at goal. Eric [name] denies nausea, vomiting, or muscle weakness.  ASSESSMENT AND PLAN:  Vitamin D deficiency - Plan: Vitamin D, Ergocalciferol, (DRISDOL) 1.25 MG (50000 UT) CAPS capsule  Essential hypertension  Class 2 severe obesity with serious comorbidity and body mass index (BMI) of 37.0 to 37.9 in adult, unspecified obesity type ([place])  PLAN:  Hypertension We discussed sodium restriction, working on healthy weight loss, and a regular exercise program as the means to achieve improved blood pressure control.  We will continue to monitor his blood pressure as well as his progress with the above lifestyle modifications. He will continue his medications as prescribed, his diet, and weight loss. He will watch for signs of hypotension as he continues his lifestyle modifications. Eric [name] agreed with this plan and agreed to follow up as directed in 2 weeks.  Vitamin D Deficiency Eric [name] was informed that low vitamin D levels contribute to fatigue and are associated with obesity, breast, and colon cancer. Eric [name] agrees to continue to take prescription Vit D @50 ,000 IU every week #4 with no refills and will follow up for routine testing of vitamin D, at least 2-3 times per year. He was informed of the risk of over-replacement of vitamin D and agrees to not increase his dose unless he discusses this with [place] first. Eric [name] agrees to follow up in 2 to 3 weeks as directed.  Obesity Eric [name] is currently in the action stage of change. As such, his goal is to continue with weight loss efforts. He has agreed to follow the Category 3 plan. Eric [name] has been instructed to work up to a goal of 150 minutes of combined cardio and strengthening exercise per week for weight loss and overall health benefits. We discussed the following Behavioral Modification Strategies today: increasing lean protein intake, increasing vegetables, work on meal planning and easy cooking plans, no skipping meals, and celebration eating strategies.   Eric [name] has agreed to follow up with our clinic in 2 to 3 weeks. He was informed of the importance of frequent follow up visits to maximize his  success with intensive lifestyle modifications for his multiple health conditions.  ALLERGIES: Allergies  Allergen Reactions  . Lisinopril Cough    MEDICATIONS: Current Outpatient Medications on File Prior to Visit  Medication Sig Dispense Refill  . aspirin EC 81 MG tablet Take 81 mg by mouth at bedtime.     [name] atorvastatin (LIPITOR) 40 MG tablet TAKE 1  TABLET DAILY (Patient taking differently: Take 40 mg by mouth daily. ) 90 tablet 4  . carbamide peroxide (DEBROX) 6.5 % OTIC solution Place 5 drops into both ears daily as needed (for ear wax).    . Coenzyme Q10 (COQ10) 200 MG CAPS Take 200 mg by mouth at bedtime.    . fluocinonide ointment (LIDEX) 0.34 % Apply 1 application topically daily as needed (for bites). 30 g 3  . fluticasone (FLONASE) 50 MCG/ACT nasal spray Place 2 sprays into both nostrils daily as needed for allergies.     [name] loratadine (CLARITIN) 10 MG tablet Take 10 mg by mouth daily as needed for allergies.     . meloxicam (MOBIC) 15 MG tablet TAKE 1 TABLET DAILY (Patient taking differently: Take 15 mg by mouth daily. ) 90 tablet 4  . metFORMIN (GLUCOPHAGE) 500 MG tablet Take 1 tablet (500 mg total) by mouth daily with breakfast. 30 tablet 0  . NONFORMULARY OR COMPOUNDED ITEM apap cpap maching--auto adjusting pressure  dx sleep apnea (Patient not taking: Reported on 09/12/2018) 1 each 0  . pantoprazole (PROTONIX) 40 MG tablet Take 1 tablet (40 mg total) by mouth daily. 90 tablet 3  . tamsulosin (FLOMAX) 0.4 MG CAPS capsule TAKE 1 CAPSULE DAILY (Patient taking differently: Take 0.4 mg by mouth daily. ) 90 capsule 3  . valsartan (DIOVAN) 80 MG tablet TAKE 1 TABLET DAILY (Patient taking differently: Take 80 mg by mouth daily. ) 90 tablet 3   No current facility-administered medications on file prior to visit.     PAST MEDICAL HISTORY: Past Medical History:  Diagnosis Date  . Alcohol abuse   . Allergy   . Back pain   . Cataract    forming  . Colon polyps   . Diverticulosis of colon   . DJD (degenerative joint disease)   . Dysrhythmia    Palpations from caffiene  . Enlarged prostate   . GERD (gastroesophageal reflux disease)   . Hearing loss   . Hip osteoarthritis    Left  . History of hay fever   . Hx of cardiovascular stress test    Lexiscan Myoview (04/2013):  No ischemia, EF 61%; low risk  . Hyperlipemia   .  Hypertension   . Insulin resistance    om metfromin   . Internal hemorrhoids without mention of complication   . Leg edema   . Neuroma of foot    left  . Obesity   . Osteoarthrosis, unspecified whether generalized or localized, unspecified site   . Palpitations   . 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 Primary localized osteoarthrosis of right shoulder 05/25/2016  . Recent weight loss    75lbs  . Seasonal allergies   . Sleep apnea    CPAP  . Thyroid nodule    calcified thyroid nodules- bx negative   . Tremor of both hands     PAST SURGICAL HISTORY: Past Surgical History:  Procedure Laterality Date  . COLONOSCOPY    . POLYPECTOMY    . SHOULDER SURGERY  2009   right  . TONSILLECTOMY    .  TOTAL HIP ARTHROPLASTY Left 03/28/2017   Procedure: TOTAL HIP ARTHROPLASTY ANTERIOR APPROACH;  Surgeon: Frederik Pear, MD;  Location: [facility];  Service: Orthopedics;  Laterality: Left;  . TOTAL SHOULDER ARTHROPLASTY Left 10/14/2015   Procedure: LEFT TOTAL SHOULDER ARTHROPLASTY;  Surgeon: Marchia Bond, MD;  Location: [facility];  Service: Orthopedics;  Laterality: Left;  . TOTAL SHOULDER ARTHROPLASTY Right 05/25/2016   Procedure: TOTAL SHOULDER ARTHROPLASTY;  Surgeon: Marchia Bond, MD;  Location: [facility];  Service: Orthopedics;  Laterality: Right;    SOCIAL HISTORY: Social History   Tobacco Use  . Smoking status: Never Smoker  . Smokeless tobacco: Never Used  Substance Use Topics  . Alcohol use: Yes    Alcohol/week: 0.0 standard drinks    Comment: rare----12 a year  . Drug use: No    FAMILY HISTORY: Family History  Problem Relation Age of Onset  . Arthritis Mother   . Cancer Mother 77       breast  . Hyperlipidemia Mother   . Colon polyps Mother 63  . Hypertension Mother   . Heart disease Mother   . Depression Mother   . Anxiety disorder Mother   . Obesity Mother   . Breast cancer Mother   . Heart disease Father        CAD--passed away 2011-07-09 age 27  . Hyperlipidemia  Father   . Hypertension Father   . Obesity Father   . Diabetes Father   . Depression Father   . Sleep apnea Father   . Cancer Sister 24       breast  . Breast cancer Sister   . Cancer Brother 20       prostate  . Prostate cancer Brother   . Breast cancer Other   . Prostate cancer Other   . Colon cancer Neg Hx   . Esophageal cancer Neg Hx   . Rectal cancer Neg Hx   . Stomach cancer Neg Hx     ROS: Review of Systems  Cardiovascular: Negative for chest pain.  Gastrointestinal: Negative for nausea and vomiting.  Musculoskeletal:       Negative for muscle weakness.  Neurological: Negative for dizziness and headaches.    PHYSICAL EXAM: Pt in no acute distress  RECENT LABS AND TESTS: BMET    Component Value Date/Time   NA 141 08/29/2018 1017   NA 141 12/14/2017 1030   K 5.0 08/29/2018 1017   CL 107 08/29/2018 1017   CO2 28 08/29/2018 1017   GLUCOSE 95 08/29/2018 1017   BUN 24 (H) 08/29/2018 1017   BUN 19 12/14/2017 1030   CREATININE 0.93 08/29/2018 1017   CREATININE 0.93 04/23/2016 1633   CALCIUM 9.2 08/29/2018 1017   GFRNONAA 70 12/14/2017 1030   GFRAA 81 12/14/2017 1030   Lab Results  Component Value Date   HGBA1C 5.6 08/29/2018   HGBA1C 5.4 03/09/2018   HGBA1C 5.7 (H) 12/14/2017   HGBA1C 5.6 03/08/2017   HGBA1C 5.5 11/18/2016   Lab Results  Component Value Date   INSULIN 21.3 12/14/2017   CBC    Component Value Date/Time   WBC 6.8 12/14/2017 1030   WBC 10.9 (H) 03/29/2017 0851   RBC 5.04 12/14/2017 1030   RBC 4.50 03/29/2017 0851   HGB 15.5 12/14/2017 1030   HCT 45.1 12/14/2017 1030   PLT 177 03/29/2017 0851   MCV 90 12/14/2017 1030   MCH 30.8 12/14/2017 1030   MCH 30.2 03/29/2017 0851   MCHC 34.4 12/14/2017 1030  MCHC 31.9 03/29/2017 0851   RDW 13.0 12/14/2017 1030   LYMPHSABS 1.7 12/14/2017 1030   MONOABS 0.5 03/16/2017 0839   EOSABS 0.1 12/14/2017 1030   BASOSABS 0.0 12/14/2017 1030   Iron/TIBC/Ferritin/ %Sat No results found for:  IRON, TIBC, FERRITIN, IRONPCTSAT Lipid Panel     Component Value Date/Time   CHOL 120 08/29/2018 1017   CHOL 150 12/14/2017 1030   TRIG 50.0 08/29/2018 1017   HDL 39.70 08/29/2018 1017   HDL 52 12/14/2017 1030   CHOLHDL 3 08/29/2018 1017   VLDL 10.0 08/29/2018 1017   LDLCALC 70 08/29/2018 1017   LDLCALC 78 12/14/2017 1030   Hepatic Function Panel     Component Value Date/Time   PROT 6.0 08/29/2018 1017   PROT 6.5 12/14/2017 1030   ALBUMIN 4.1 08/29/2018 1017   ALBUMIN 4.5 12/14/2017 1030   AST 10 08/29/2018 1017   ALT 12 08/29/2018 1017   ALKPHOS 58 08/29/2018 1017   BILITOT 0.4 08/29/2018 1017   BILITOT 0.6 12/14/2017 1030   BILIDIR 0.2 06/07/2014 0909      Component Value Date/Time   TSH 1.060 12/14/2017 1030   TSH 0.62 10/03/2015 1230   TSH 0.51 10/08/2010 0859   Results for Rafuse, Lancer L "MIKE" (MRN [id number]) as of 09/14/2018 09:30  Ref. Range 12/14/2017 10:30  Vitamin D, 25-Hydroxy Latest Ref Range: 30.0 - 100.0 ng/mL 23.2 (L)     I, Marcille Blanco, CMA, am acting as transcriptionist for Starlyn Skeans, MD I have reviewed the above documentation for accuracy and completeness, and I agree with the above. -[name], MD

## 2018-09-20 NOTE — Patient Instructions (Addendum)
YOU NEED TO HAVE A COVID 19 TEST __TODAY, July 2nd________, THIS TEST MUST BE DONE BEFORE SURGERY, COME TO [place] ENTRANCE. ONCE YOUR COVID TEST IS COMPLETED, PLEASE BEGIN THE QUARANTINE INSTRUCTIONS AS OUTLINED IN YOUR HANDOUT.                 Eric [name]    Your procedure is scheduled on: 09-25-2018   Report to [facility] Main  Entrance    Report to admitting at 7:40 AM      Call this number if you have problems the morning of surgery [phone]    Remember: [place], NO [place].    Do not eat food After Midnight. YOU MAY HAVE CLEAR LIQUIDS FROM MIDNIGHT UNTIL 7:10AM. At 7:10AM Please finish the prescribed Pre-Surgery Gatorade drink. Nothing by mouth after you finish the Gatorade drink !    CLEAR LIQUID DIET   Foods Allowed                                                                     Foods Excluded  Coffee and tea, regular and decaf                             liquids that you cannot  Plain Jell-O in any flavor                                             see through such as: Fruit ices (not with fruit pulp)                                     milk, soups, orange juice  Iced Popsicles                                    All solid food Carbonated beverages, regular and diet                                    Cranberry, grape and apple juices Sports drinks like Gatorade Lightly seasoned clear broth or consume(fat free) Sugar, honey syrup  Sample Menu Breakfast                                Lunch                                     Supper Cranberry juice                    Beef broth  Chicken broth Jell-O                                     Grape juice                           Apple juice Coffee or tea                        Jell-O                                      Popsicle                                                 Coffee or tea                        Coffee or tea  _____________________________________________________________________     Take these medicines the morning of surgery with A SIP OF WATER: atorvastatin, Protonix, Tamsulosin   DO NOT TAKE ANY DIABETIC MEDICATIONS DAY OF YOUR SURGERY                        How to Manage Your Diabetes Before and After Surgery  Why is it important to control my blood sugar before and after surgery? . Improving blood sugar levels before and after surgery helps healing and can limit problems. . A way of improving blood sugar control is eating a healthy diet by: o  Eating less sugar and carbohydrates o  Increasing activity/exercise o  Talking with your doctor about reaching your blood sugar goals . High blood sugars (greater than 180 mg/dL) can raise your risk of infections and slow your recovery, so you will need to focus on controlling your diabetes during the weeks before surgery. . Make sure that the doctor who takes care of your diabetes knows about your planned surgery including the date and location.  How do I manage my blood sugar before surgery? . Check your blood sugar at least 4 times a day, starting 2 days before surgery, to make sure that the level is not too high or low. o Check your blood sugar the morning of your surgery when you wake up and every 2 hours until you get to the Short Stay unit. . If your blood sugar is less than 70 mg/dL, you will need to treat for low blood sugar: o Do not take insulin. o Treat a low blood sugar (less than 70 mg/dL) with  cup of clear juice (cranberry or apple), 4 glucose tablets, OR glucose gel. o Recheck blood sugar in 15 minutes after treatment (to make sure it is greater than 70 mg/dL). If your blood sugar is not greater than 70 mg/dL on recheck, call [phone] for further instructions. . Report your blood sugar to the short stay nurse when you get to Short Stay.  . If you are admitted to the  hospital after surgery: o Your blood sugar will be checked by the staff and you will probably be given insulin after surgery (instead of oral diabetes medicines) to make sure  you have good blood sugar levels. o The goal for blood sugar control after surgery is 80-180 mg/dL.   WHAT DO I DO ABOUT MY DIABETES MEDICATION?  [name] Do not take oral diabetes medicines (pills) the morning of surgery.  . THE NIGHT BEFORE SURGERY, take     units of       insulin.       . THE MORNING OF SURGERY, take   units of         insulin.      Reviewed and Endorsed by [facility] Patient Education Committee, August 2015       You may not have any metal on your body including hair pins and              piercings  Do not wear jewelry, make-up, lotions, powders or perfumes, deodorant                          Men may shave face and neck.   Do not bring valuables to the hospital. [place].  Contacts, dentures or bridgework may not be worn into surgery.  Leave suitcase in the car. After surgery it may be brought to your room.      _____________________________________________________________________             [facility] - Preparing for Surgery Before surgery, you can play an important role.  Because skin is not sterile, your skin needs to be as free of germs as possible.  You can reduce the number of germs on your skin by washing with CHG (chlorahexidine gluconate) soap before surgery.  CHG is an antiseptic cleaner which kills germs and bonds with the skin to continue killing germs even after washing. Please DO NOT use if you have an allergy to CHG or antibacterial soaps.  If your skin becomes reddened/irritated stop using the CHG and inform your nurse when you arrive at Short Stay. Do not shave (including legs and underarms) for at least 48 hours prior to the first CHG shower.  You may shave your face/neck. Please follow these instructions carefully:  1.   Shower with CHG Soap the night before surgery and the  morning of Surgery.  2.  If you choose to wash your hair, wash your hair first as usual with your  normal  shampoo.  3.  After you shampoo, rinse your hair and body thoroughly to remove the  shampoo.                           4.  Use CHG as you would any other liquid soap.  You can apply chg directly  to the skin and wash                       Gently with a scrungie or clean washcloth.  5.  Apply the CHG Soap to your body ONLY FROM THE NECK DOWN.   Do not use on face/ open                           Wound or open sores. Avoid contact with eyes, ears mouth and genitals (private parts).  Wash face,  Genitals (private parts) with your normal soap.             6.  Wash thoroughly, paying special attention to the area where your surgery  will be performed.  7.  Thoroughly rinse your body with warm water from the neck down.  8.  DO NOT shower/wash with your normal soap after using and rinsing off  the CHG Soap.                9.  Pat yourself dry with a clean towel.            10.  Wear clean pajamas.            11.  Place clean sheets on your bed the night of your first shower and do not  sleep with pets. Day of Surgery : Do not apply any lotions/deodorants the morning of surgery.  Please wear clean clothes to the hospital/surgery center.  FAILURE TO FOLLOW THESE INSTRUCTIONS MAY RESULT IN THE CANCELLATION OF YOUR SURGERY PATIENT SIGNATURE_________________________________  NURSE SIGNATURE__________________________________  ________________________________________________________________________   Adam Phenix  An incentive spirometer is a tool that can help keep your lungs clear and active. This tool measures how well you are filling your lungs with each breath. Taking long deep breaths may help reverse or decrease the chance of developing breathing (pulmonary) problems (especially infection) following:  A long  period of time when you are unable to move or be active. BEFORE THE PROCEDURE   If the spirometer includes an indicator to show your best effort, your nurse or respiratory therapist will set it to a desired goal.  If possible, sit up straight or lean slightly forward. Try not to slouch.  Hold the incentive spirometer in an upright position. INSTRUCTIONS FOR USE  1. Sit on the edge of your bed if possible, or sit up as far as you can in bed or on a chair. 2. Hold the incentive spirometer in an upright position. 3. Breathe out normally. 4. Place the mouthpiece in your mouth and seal your lips tightly around it. 5. Breathe in slowly and as deeply as possible, raising the piston or the ball toward the top of the column. 6. Hold your breath for 3-5 seconds or for as long as possible. Allow the piston or ball to fall to the bottom of the column. 7. Remove the mouthpiece from your mouth and breathe out normally. 8. Rest for a few seconds and repeat Steps 1 through 7 at least 10 times every 1-2 hours when you are awake. Take your time and take a few normal breaths between deep breaths. 9. The spirometer may include an indicator to show your best effort. Use the indicator as a goal to work toward during each repetition. 10. After each set of 10 deep breaths, practice coughing to be sure your lungs are clear. If you have an incision (the cut made at the time of surgery), support your incision when coughing by placing a pillow or rolled up towels firmly against it. Once you are able to get out of bed, walk around indoors and cough well. You may stop using the incentive spirometer when instructed by your caregiver.  RISKS AND COMPLICATIONS  Take your time so you do not get dizzy or light-headed.  If you are in pain, you may need to take or ask for pain medication before doing incentive spirometry. It is harder to take a deep breath if you are having pain.  AFTER USE  Rest and breathe slowly and  easily.  It can be helpful to keep track of a log of your progress. Your caregiver can provide you with a simple table to help with this. If you are using the spirometer at home, follow these instructions: [place] IF:   You are having difficultly using the spirometer.  You have trouble using the spirometer as often as instructed.  Your pain medication is not giving enough relief while using the spirometer.  You develop fever of 100.5 F (38.1 C) or higher. SEEK IMMEDIATE MEDICAL CARE IF:   You cough up bloody sputum that had not been present before.  You develop fever of 102 F (38.9 C) or greater.  You develop worsening pain at or near the incision site. MAKE SURE YOU:   Understand these instructions.  Will watch your condition.  Will get help right away if you are not doing well or get worse. Document Released: 07/19/2006 Document Revised: 05/31/2011 Document Reviewed: 09/19/2006 [employer] Patient Information 2014 [employer], [place].   ________________________________________________________________________  WHAT IS A BLOOD TRANSFUSION? Blood Transfusion Information  A transfusion is the replacement of blood or some of its parts. Blood is made up of multiple cells which provide different functions.  Red blood cells carry oxygen and are used for blood loss replacement.  White blood cells fight against infection.  Platelets control bleeding.  Plasma helps clot blood.  Other blood products are available for specialized needs, such as hemophilia or other clotting disorders. BEFORE THE TRANSFUSION  Who gives blood for transfusions?   Healthy volunteers who are fully evaluated to make sure their blood is safe. This is blood bank blood. Transfusion therapy is the safest it has ever been in the practice of medicine. Before blood is taken from a donor, a complete history is taken to make sure that person has no history of diseases nor engages in risky social  behavior (examples are intravenous drug use or sexual activity with multiple partners). The donor's travel history is screened to minimize risk of transmitting infections, such as malaria. The donated blood is tested for signs of infectious diseases, such as HIV and hepatitis. The blood is then tested to be sure it is compatible with you in order to minimize the chance of a transfusion reaction. If you or a relative donates blood, this is often done in anticipation of surgery and is not appropriate for emergency situations. It takes many days to process the donated blood. RISKS AND COMPLICATIONS Although transfusion therapy is very safe and saves many lives, the main dangers of transfusion include:   Getting an infectious disease.  Developing a transfusion reaction. This is an allergic reaction to something in the blood you were given. Every precaution is taken to prevent this. The decision to have a blood transfusion has been considered carefully by your caregiver before blood is given. Blood is not given unless the benefits outweigh the risks. AFTER THE TRANSFUSION  Right after receiving a blood transfusion, you will usually feel much better and more energetic. This is especially true if your red blood cells have gotten low (anemic). The transfusion raises the level of the red blood cells which carry oxygen, and this usually causes an energy increase.  The nurse administering the transfusion will monitor you carefully for complications. HOME CARE INSTRUCTIONS  No special instructions are needed after a transfusion. You may find your energy is better. Speak with your caregiver about any limitations on activity for underlying diseases  you may have. SEEK MEDICAL CARE IF:   Your condition is not improving after your transfusion.  You develop redness or irritation at the intravenous (IV) site. SEEK IMMEDIATE MEDICAL CARE IF:  Any of the following symptoms occur over the next 12 hours:  Shaking  chills.  You have a temperature by mouth above 102 F (38.9 C), not controlled by medicine.  Chest, back, or muscle pain.  People around you feel you are not acting correctly or are confused.  Shortness of breath or difficulty breathing.  Dizziness and fainting.  You get a rash or develop hives.  You have a decrease in urine output.  Your urine turns a dark color or changes to pink, red, or brown. Any of the following symptoms occur over the next 10 days:  You have a temperature by mouth above 102 F (38.9 C), not controlled by medicine.  Shortness of breath.  Weakness after normal activity.  The white part of the eye turns yellow (jaundice).  You have a decrease in the amount of urine or are urinating less often.  Your urine turns a dark color or changes to pink, red, or brown. Document Released: 03/05/2000 Document Revised: 05/31/2011 Document Reviewed: 10/23/2007 [facility] Patient Information 2014 [place], [place].  _______________________________________________________________________

## 2018-09-21 ENCOUNTER — Other Ambulatory Visit: Payer: Self-pay

## 2018-09-21 ENCOUNTER — Ambulatory Visit (HOSPITAL_COMMUNITY)
Admission: RE | Admit: 2018-09-21 | Discharge: 2018-09-21 | Disposition: A | Discharge status: Home / Self Care | Payer: Medicare Other | Source: Ambulatory Visit | Attending: Orthopedic Surgery | Admitting: Orthopedic Surgery

## 2018-09-21 ENCOUNTER — Encounter (HOSPITAL_COMMUNITY): Payer: Self-pay

## 2018-09-21 ENCOUNTER — Other Ambulatory Visit (HOSPITAL_COMMUNITY)
Admission: RE | Admit: 2018-09-21 | Discharge: 2018-09-21 | Disposition: A | Discharge status: Home / Self Care | Payer: Medicare Other | Source: Ambulatory Visit | Attending: Orthopedic Surgery | Admitting: Orthopedic Surgery

## 2018-09-21 ENCOUNTER — Encounter (HOSPITAL_COMMUNITY)
Admission: RE | Admit: 2018-09-21 | Discharge: 2018-09-21 | Disposition: A | Discharge status: Home / Self Care | Payer: Medicare Other | Source: Ambulatory Visit | Attending: Orthopedic Surgery | Admitting: Orthopedic Surgery

## 2018-09-21 DIAGNOSIS — Z01818 Encounter for other preprocedural examination: Secondary | ICD-10-CM | POA: Diagnosis not present

## 2018-09-21 DIAGNOSIS — Z1159 Encounter for screening for other viral diseases: Secondary | ICD-10-CM | POA: Insufficient documentation

## 2018-09-21 DIAGNOSIS — M1611 Unilateral primary osteoarthritis, right hip: Secondary | ICD-10-CM | POA: Insufficient documentation

## 2018-09-21 LAB — CBC WITH DIFFERENTIAL/PLATELET
Abs Immature Granulocytes: 0.01 10*3/uL (ref 0.00–0.07)
Basophils Absolute: 0 10*3/uL (ref 0.0–0.1)
Basophils Relative: 0 %
Eosinophils Absolute: 0 10*3/uL (ref 0.0–0.5)
Eosinophils Relative: 0 %
HCT: 50.3 % (ref 39.0–52.0)
Hemoglobin: 16.2 g/dL (ref 13.0–17.0)
Immature Granulocytes: 0 %
Lymphocytes Relative: 18 %
Lymphs Abs: 1.4 10*3/uL (ref 0.7–4.0)
MCH: 30.7 pg (ref 26.0–34.0)
MCHC: 32.2 g/dL (ref 30.0–36.0)
MCV: 95.3 fL (ref 80.0–100.0)
Monocytes Absolute: 0.8 10*3/uL (ref 0.1–1.0)
Monocytes Relative: 11 %
Neutro Abs: 5.2 10*3/uL (ref 1.7–7.7)
Neutrophils Relative %: 71 %
Platelets: 201 10*3/uL (ref 150–400)
RBC: 5.28 MIL/uL (ref 4.22–5.81)
RDW: 12.9 % (ref 11.5–15.5)
WBC: 7.4 10*3/uL (ref 4.0–10.5)
nRBC: 0 % (ref 0.0–0.2)

## 2018-09-21 LAB — BASIC METABOLIC PANEL
Anion gap: 7 (ref 5–15)
BUN: 26 mg/dL — ABNORMAL HIGH (ref 8–23)
CO2: 27 mmol/L (ref 22–32)
Calcium: 9.5 mg/dL (ref 8.9–10.3)
Chloride: 107 mmol/L (ref 98–111)
Creatinine, Ser: 0.92 mg/dL (ref 0.61–1.24)
GFR calc Af Amer: >60 mL/min (ref >60)
GFR calc non Af Amer: >60 mL/min (ref >60)
Glucose, Bld: 101 mg/dL — ABNORMAL HIGH (ref 70–99)
Potassium: 4.3 mmol/L (ref 3.5–5.1)
Sodium: 141 mmol/L (ref 135–145)

## 2018-09-21 LAB — APTT: aPTT: 30 seconds (ref 24–36)

## 2018-09-21 LAB — URINALYSIS, ROUTINE W REFLEX MICROSCOPIC
Bilirubin Urine: NEGATIVE
Glucose, UA: NEGATIVE mg/dL
Hgb urine dipstick: NEGATIVE
Ketones, ur: NEGATIVE mg/dL
Leukocytes,Ua: NEGATIVE
Nitrite: NEGATIVE
Protein, ur: NEGATIVE mg/dL
Specific Gravity, Urine: 1.019 (ref 1.005–1.030)
pH: 5 (ref 5.0–8.0)

## 2018-09-21 LAB — PROTIME-INR
INR: 1 (ref 0.8–1.2)
Prothrombin Time: 12.9 seconds (ref 11.4–15.2)

## 2018-09-21 LAB — GLUCOSE, CAPILLARY: Glucose-Capillary: 91 mg/dL (ref 70–99)

## 2018-09-21 LAB — SARS CORONAVIRUS 2 (TAT 6-24 HRS): SARS Coronavirus 2: NEGATIVE

## 2018-09-21 LAB — SURGICAL PCR SCREEN
MRSA, PCR: NEGATIVE
Staphylococcus aureus: NEGATIVE

## 2018-09-21 LAB — ABO/RH: ABO/RH(D): A NEG

## 2018-09-21 NOTE — H&P (Signed)
TOTAL HIP ADMISSION H&P  Patient is admitted for right total hip arthroplasty.  Subjective:  Chief Complaint: right hip pain  HPI: Eric [name], 73 y.o. male, has a history of pain and functional disability in the right hip(s) due to arthritis and patient has failed non-surgical conservative treatments for greater than 12 weeks to include NSAID's and/or analgesics, flexibility and strengthening excercises, weight reduction as appropriate and activity modification.  Onset of symptoms was gradual starting several years ago with gradually worsening course since that time.The patient noted no past surgery on the right hip(s).  Patient currently rates pain in the right hip at 10 out of 10 with activity. Patient has night pain, worsening of pain with activity and weight bearing, trendelenberg gait, pain that interfers with activities of daily living and pain with passive range of motion. Patient has evidence of periarticular osteophytes and joint space narrowing by imaging studies. This condition presents safety issues increasing the risk of falls.   There is no current active infection.  Patient Active Problem List   Diagnosis Date Noted  . Prediabetes 08/29/2018  . Vitamin D deficiency 08/29/2018  . Primary osteoarthritis of right hip 08/29/2018  . Thyroid nodule 05/12/2017  . Primary osteoarthritis of left hip 03/28/2017  . Status post total replacement of left hip 03/28/2017  . Osteoarthritis of left hip 03/25/2017  . Essential hypertension 11/18/2016  . Hyperlipidemia LDL goal <100 11/18/2016  . Need for prophylactic vaccination and inoculation against influenza 11/18/2016  . Primary localized osteoarthrosis of right shoulder 05/25/2016  . History of total replacement of right shoulder joint 05/25/2016  . 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 S/P shoulder replacement 10/14/2015  . Low back pain 09/03/2015  . Left foot pain 04/04/2014  . Left shoulder pain 02/20/2014   . Palpitations 12/25/2012  . Left hip pain 12/25/2012  . Class 2 severe obesity with serious comorbidity and body mass index (BMI) of 37.0 to 37.9 in adult ([place]) 12/25/2012  . Acute bronchitis 12/25/2012  . Urgency of urination 12/24/2011  . Urinary frequency 12/24/2011  . Pre-op evaluation 10/08/2010  . Hyperlipidemia 10/08/2010  . DECREASED HEARING 09/04/2009  . INTERNAL HEMORRHOIDS WITHOUT MENTION COMP 09/04/2009  . ALLERGIC RHINITIS, SEASONAL 09/04/2009  . GERD 09/04/2009  . DIVERTICULOSIS, COLON 09/04/2009  . OSTEOARTHRITIS 09/04/2009  . Sleep apnea 09/04/2009   Past Medical History:  Diagnosis Date  . Alcohol abuse   . Allergy   . Back pain   . Cataract    forming  . Colon polyps   . Diverticulosis of colon   . DJD (degenerative joint disease)   . Dysrhythmia    Palpations from caffiene- reports he was having PACs due to slim fast wth caffeine - had full cardiac w/u and was told to cut back on caffeine;  now drink decaf bevrerages and no rpeort of recurrence   . Enlarged prostate   . GERD (gastroesophageal reflux disease)   . Hearing loss   . Hip osteoarthritis    Left  . History of hay fever   . Hx of cardiovascular stress test    Lexiscan Myoview (04/2013):  No ischemia, EF 61%; low risk  . Hyperlipemia   . Hypertension   . Insulin resistance    om metfromin   . Internal hemorrhoids without mention of complication   . Leg edema   . Neuroma of foot    left  . Obesity   . Osteoarthrosis, unspecified whether generalized or localized, unspecified site   .  Palpitations   . 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 Primary localized osteoarthrosis of right shoulder 05/25/2016  . Recent weight loss    75lbs  . Seasonal allergies   . Sleep apnea    CPAP  . Thyroid nodule    calcified thyroid nodules- bx negative   . Tremor of both hands     Past Surgical History:  Procedure Laterality Date  . COLONOSCOPY    . POLYPECTOMY    . SHOULDER SURGERY   2009   right  . TONSILLECTOMY    . TOTAL HIP ARTHROPLASTY Left 03/28/2017   Procedure: TOTAL HIP ARTHROPLASTY ANTERIOR APPROACH;  Surgeon: Frederik Pear, MD;  Location: [facility];  Service: Orthopedics;  Laterality: Left;  . TOTAL SHOULDER ARTHROPLASTY Left 10/14/2015   Procedure: LEFT TOTAL SHOULDER ARTHROPLASTY;  Surgeon: Marchia Bond, MD;  Location: [facility];  Service: Orthopedics;  Laterality: Left;  . TOTAL SHOULDER ARTHROPLASTY Right 05/25/2016   Procedure: TOTAL SHOULDER ARTHROPLASTY;  Surgeon: Marchia Bond, MD;  Location: [facility];  Service: Orthopedics;  Laterality: Right;    No current facility-administered medications for this encounter.    Current Outpatient Medications  Medication Sig Dispense Refill Last Dose  . aspirin EC 81 MG tablet Take 81 mg by mouth at bedtime.      [name] atorvastatin (LIPITOR) 40 MG tablet TAKE 1 TABLET DAILY (Patient taking differently: Take 40 mg by mouth daily. ) 90 tablet 4   . carbamide peroxide (DEBROX) 6.5 % OTIC solution Place 5 drops into both ears daily as needed (for ear wax).     . Coenzyme Q10 (COQ10) 200 MG CAPS Take 200 mg by mouth at bedtime.     . fluocinonide ointment (LIDEX) 0.63 % Apply 1 application topically daily as needed (for bites). 30 g 3   . fluticasone (FLONASE) 50 MCG/ACT nasal spray Place 2 sprays into both nostrils daily as needed for allergies.      [name] loratadine (CLARITIN) 10 MG tablet Take 10 mg by mouth daily as needed for allergies.      . meloxicam (MOBIC) 15 MG tablet TAKE 1 TABLET DAILY (Patient taking differently: Take 15 mg by mouth daily. ) 90 tablet 4   . metFORMIN (GLUCOPHAGE) 500 MG tablet Take 1 tablet (500 mg total) by mouth daily with breakfast. 30 tablet 0   . pantoprazole (PROTONIX) 40 MG tablet Take 1 tablet (40 mg total) by mouth daily. 90 tablet 3   . tamsulosin (FLOMAX) 0.4 MG CAPS capsule TAKE 1 CAPSULE DAILY (Patient taking differently: Take 0.4 mg by mouth daily. ) 90 capsule 3   . valsartan (DIOVAN) 80 MG tablet  TAKE 1 TABLET DAILY (Patient taking differently: Take 80 mg by mouth daily. ) 90 tablet 3   . NONFORMULARY OR COMPOUNDED ITEM apap cpap maching--auto adjusting pressure  dx sleep apnea (Patient not taking: Reported on 09/12/2018) 1 each 0 Not Taking at Unknown time  . Vitamin D, Ergocalciferol, (DRISDOL) 1.25 MG (50000 UT) CAPS capsule Take 1 capsule (50,000 Units total) by mouth every 7 (seven) days. 4 capsule 0    Allergies  Allergen Reactions  . Lisinopril Cough    Social History   Tobacco Use  . Smoking status: Never Smoker  . Smokeless tobacco: Never Used  Substance Use Topics  . Alcohol use: Yes    Alcohol/week: 0.0 standard drinks    Comment: rare----12 a year    Family History  Problem Relation Age of Onset  . Arthritis Mother   .  Cancer Mother 26       breast  . Hyperlipidemia Mother   . Colon polyps Mother 41  . Hypertension Mother   . Heart disease Mother   . Depression Mother   . Anxiety disorder Mother   . Obesity Mother   . Breast cancer Mother   . Heart disease Father        CAD--passed away 06/21/11 age 34  . Hyperlipidemia Father   . Hypertension Father   . Obesity Father   . Diabetes Father   . Depression Father   . Sleep apnea Father   . Cancer Sister 73       breast  . Breast cancer Sister   . Cancer Brother 51       prostate  . Prostate cancer Brother   . Breast cancer Other   . Prostate cancer Other   . Colon cancer Neg Hx   . Esophageal cancer Neg Hx   . Rectal cancer Neg Hx   . Stomach cancer Neg Hx      Review of Systems  Constitutional: Negative.   HENT: Positive for hearing loss and tinnitus.   Eyes: Negative.   Respiratory: Negative.   Cardiovascular: Negative.        Htn  Gastrointestinal: Positive for heartburn.  Genitourinary: Negative.   Musculoskeletal: Positive for joint pain and myalgias.  Skin: Negative.   Neurological: Positive for tremors.  Endo/Heme/Allergies: Bruises/bleeds easily.  Psychiatric/Behavioral: Negative.      Objective:  Physical Exam  Constitutional: He is oriented to person, place, and time. He appears well-developed and well-nourished.  HENT:  Head: Normocephalic and atraumatic.  Eyes: Pupils are equal, round, and reactive to light.  Neck: Normal range of motion. Neck supple.  Cardiovascular: Intact distal pulses.  Respiratory: Effort normal.  Musculoskeletal:        General: Tenderness present.     Comments: the patient's right hip does have significant pain with attempts of internal rotation.  He has a range from roughly 10-15 of internal and external rotation whereas the left hip has approximately 30 of internal and external rotation.  His calves are soft and nontender.  He is neurovascularly intact distally.  Neurological: He is alert and oriented to person, place, and time.  Skin: Skin is warm and dry.  Psychiatric: He has a normal mood and affect. His behavior is normal. Judgment and thought content normal.    Vital signs in last 24 hours: Temp:  [98.8 F (37.1 C)] 98.8 F (37.1 C) (07/02 0742) Pulse Rate:  [73] 73 (07/02 0742) Resp:  [18] 18 (07/02 0742) BP: (129)/(76) 129/76 (07/02 0742) SpO2:  [100 %] 100 % (07/02 0742) Weight:  [416 kg] 109 kg (07/02 0742)  Labs:   Estimated body mass index is 36.53 kg/m as calculated from the following:   Height as of 09/21/18: 5\' 8"  (1.727 m).   Weight as of 09/21/18: 109 kg.   Imaging Review Plain radiographs demonstrate  AP of the pelvis and crosstable lateral of the right hip are taken and reviewed in office today.  Patient has end-stage arthritis of the right hip with periarticular osteophyte formation.      Assessment/Plan:  End stage arthritis, right hip(s)  The patient history, physical examination, clinical judgement of the provider and imaging studies are consistent with end stage degenerative joint disease of the right hip(s) and total hip arthroplasty is deemed medically necessary. The treatment options  including medical management, injection therapy, arthroscopy and arthroplasty were  discussed at length. The risks and benefits of total hip arthroplasty were presented and reviewed. The risks due to aseptic loosening, infection, stiffness, dislocation/subluxation,  thromboembolic complications and other imponderables were discussed.  The patient acknowledged the explanation, agreed to proceed with the plan and consent was signed. Patient is being admitted for inpatient treatment for surgery, pain control, PT, OT, prophylactic antibiotics, VTE prophylaxis, progressive ambulation and ADL's and discharge planning.The patient is planning to be discharged home with home health services    Patient's anticipated LOS is less than 2 midnights, meeting these requirements: - Younger than 84 - Lives within 1 hour of care - Has a competent adult at home to recover with post-op recover - NO history of  - Chronic pain requiring opiods  - Diabetes  - Coronary Artery Disease  - Heart failure  - Heart attack  - Stroke  - DVT/VTE  - Cardiac arrhythmia  - Respiratory Failure/COPD  - Renal failure  - Anemia  - Advanced Liver disease

## 2018-09-24 MED ORDER — TRANEXAMIC ACID 1000 MG/10ML IV SOLN
2000.0000 mg | INTRAVENOUS | Status: DC
  Filled 2018-09-24: qty 20

## 2018-09-24 MED ORDER — BUPIVACAINE LIPOSOME 1.3 % IJ SUSP
10.0000 mL | Freq: Once | INTRAMUSCULAR | Status: DC
  Filled 2018-09-24: qty 10

## 2018-09-25 ENCOUNTER — Encounter (HOSPITAL_COMMUNITY): Payer: Self-pay | Admitting: Registered Nurse

## 2018-09-25 ENCOUNTER — Inpatient Hospital Stay (HOSPITAL_COMMUNITY): Payer: Medicare Other | Admitting: Registered Nurse

## 2018-09-25 ENCOUNTER — Inpatient Hospital Stay (HOSPITAL_COMMUNITY): Payer: Medicare Other

## 2018-09-25 ENCOUNTER — Inpatient Hospital Stay (HOSPITAL_COMMUNITY): Payer: Medicare Other | Admitting: Physician Assistant

## 2018-09-25 ENCOUNTER — Inpatient Hospital Stay (HOSPITAL_COMMUNITY)
Admission: RE | Admit: 2018-09-25 | Discharge: 2018-09-26 | DRG: 470 | Disposition: A | Discharge status: Home / Self Care | Payer: Medicare Other | Attending: Orthopedic Surgery | Admitting: Orthopedic Surgery

## 2018-09-25 ENCOUNTER — Encounter (HOSPITAL_COMMUNITY)
Admission: RE | Disposition: A | Discharge status: Home / Self Care | Payer: Self-pay | Source: Home / Self Care | Attending: Orthopedic Surgery

## 2018-09-25 ENCOUNTER — Other Ambulatory Visit: Payer: Self-pay

## 2018-09-25 DIAGNOSIS — Z8249 Family history of ischemic heart disease and other diseases of the circulatory system: Secondary | ICD-10-CM

## 2018-09-25 DIAGNOSIS — K219 Gastro-esophageal reflux disease without esophagitis: Secondary | ICD-10-CM | POA: Diagnosis present

## 2018-09-25 DIAGNOSIS — Z419 Encounter for procedure for purposes other than remedying health state, unspecified: Secondary | ICD-10-CM

## 2018-09-25 DIAGNOSIS — Z7984 Long term (current) use of oral hypoglycemic drugs: Secondary | ICD-10-CM

## 2018-09-25 DIAGNOSIS — Z1159 Encounter for screening for other viral diseases: Secondary | ICD-10-CM

## 2018-09-25 DIAGNOSIS — Z888 Allergy status to other drugs, medicaments and biological substances status: Secondary | ICD-10-CM

## 2018-09-25 DIAGNOSIS — Z791 Long term (current) use of non-steroidal anti-inflammatories (NSAID): Secondary | ICD-10-CM

## 2018-09-25 DIAGNOSIS — M25551 Pain in right hip: Secondary | ICD-10-CM | POA: Diagnosis not present

## 2018-09-25 DIAGNOSIS — J309 Allergic rhinitis, unspecified: Secondary | ICD-10-CM | POA: Diagnosis present

## 2018-09-25 DIAGNOSIS — E785 Hyperlipidemia, unspecified: Secondary | ICD-10-CM | POA: Diagnosis not present

## 2018-09-25 DIAGNOSIS — M1611 Unilateral primary osteoarthritis, right hip: Principal | ICD-10-CM | POA: Diagnosis present

## 2018-09-25 DIAGNOSIS — Z96642 Presence of left artificial hip joint: Secondary | ICD-10-CM | POA: Diagnosis present

## 2018-09-25 DIAGNOSIS — Z7982 Long term (current) use of aspirin: Secondary | ICD-10-CM

## 2018-09-25 DIAGNOSIS — E669 Obesity, unspecified: Secondary | ICD-10-CM | POA: Diagnosis present

## 2018-09-25 DIAGNOSIS — G4730 Sleep apnea, unspecified: Secondary | ICD-10-CM | POA: Diagnosis not present

## 2018-09-25 DIAGNOSIS — I10 Essential (primary) hypertension: Secondary | ICD-10-CM | POA: Diagnosis not present

## 2018-09-25 DIAGNOSIS — Z6836 Body mass index (BMI) 36.0-36.9, adult: Secondary | ICD-10-CM | POA: Diagnosis not present

## 2018-09-25 DIAGNOSIS — R7303 Prediabetes: Secondary | ICD-10-CM | POA: Diagnosis not present

## 2018-09-25 DIAGNOSIS — Z79891 Long term (current) use of opiate analgesic: Secondary | ICD-10-CM

## 2018-09-25 DIAGNOSIS — Z96612 Presence of left artificial shoulder joint: Secondary | ICD-10-CM | POA: Diagnosis present

## 2018-09-25 DIAGNOSIS — Z96641 Presence of right artificial hip joint: Secondary | ICD-10-CM

## 2018-09-25 DIAGNOSIS — E559 Vitamin D deficiency, unspecified: Secondary | ICD-10-CM | POA: Diagnosis not present

## 2018-09-25 DIAGNOSIS — D62 Acute posthemorrhagic anemia: Secondary | ICD-10-CM | POA: Diagnosis not present

## 2018-09-25 DIAGNOSIS — Z96611 Presence of right artificial shoulder joint: Secondary | ICD-10-CM | POA: Diagnosis present

## 2018-09-25 DIAGNOSIS — Z7983 Long term (current) use of bisphosphonates: Secondary | ICD-10-CM

## 2018-09-25 DIAGNOSIS — Z471 Aftercare following joint replacement surgery: Secondary | ICD-10-CM | POA: Diagnosis not present

## 2018-09-25 DIAGNOSIS — Z79899 Other long term (current) drug therapy: Secondary | ICD-10-CM | POA: Diagnosis not present

## 2018-09-25 DIAGNOSIS — M25751 Osteophyte, right hip: Secondary | ICD-10-CM | POA: Diagnosis present

## 2018-09-25 DIAGNOSIS — N400 Enlarged prostate without lower urinary tract symptoms: Secondary | ICD-10-CM | POA: Diagnosis present

## 2018-09-25 HISTORY — PX: TOTAL HIP ARTHROPLASTY: SHX124

## 2018-09-25 LAB — GLUCOSE, CAPILLARY
Glucose-Capillary: 105 mg/dL — ABNORMAL HIGH (ref 70–99)
Glucose-Capillary: 187 mg/dL — ABNORMAL HIGH (ref 70–99)
Glucose-Capillary: 168 mg/dL — ABNORMAL HIGH (ref 70–99)

## 2018-09-25 LAB — TYPE AND SCREEN
ABO/RH(D): A NEG
Antibody Screen: NEGATIVE

## 2018-09-25 SURGERY — ARTHROPLASTY, HIP, TOTAL, ANTERIOR APPROACH
Anesthesia: Spinal | Site: Hip | Laterality: Right

## 2018-09-25 MED ORDER — COQ10 200 MG PO CAPS: 200.0000 mg | ORAL_CAPSULE | Freq: Every day | ORAL | Status: DC

## 2018-09-25 MED ORDER — DEXAMETHASONE SODIUM PHOSPHATE 10 MG/ML IJ SOLN
INTRAMUSCULAR | Status: AC
  Filled 2018-09-25: qty 1

## 2018-09-25 MED ORDER — DIPHENHYDRAMINE HCL 12.5 MG/5ML PO ELIX: 12.5000 mg | ORAL_SOLUTION | ORAL | Status: DC | PRN

## 2018-09-25 MED ORDER — ACETAMINOPHEN 500 MG PO TABS
1000.0000 mg | ORAL_TABLET | Freq: Four times a day (QID) | ORAL | Status: DC
  Administered 2018-09-25 – 2018-09-26 (×3): 1000 mg via ORAL
  Filled 2018-09-25 (×3): qty 2

## 2018-09-25 MED ORDER — SODIUM CHLORIDE (PF) 0.9 % IJ SOLN
INTRAMUSCULAR | Status: AC
  Filled 2018-09-25: qty 50

## 2018-09-25 MED ORDER — FLUOCINONIDE 0.05 % EX OINT: TOPICAL_OINTMENT | Freq: Two times a day (BID) | CUTANEOUS | Status: DC

## 2018-09-25 MED ORDER — SODIUM CHLORIDE (PF) 0.9 % IJ SOLN
INTRAMUSCULAR | Status: AC
  Filled 2018-09-25: qty 20

## 2018-09-25 MED ORDER — FENTANYL CITRATE (PF) 100 MCG/2ML IJ SOLN
INTRAMUSCULAR | Status: DC | PRN
  Administered 2018-09-25: 100 ug via INTRAVENOUS

## 2018-09-25 MED ORDER — ONDANSETRON HCL 4 MG/2ML IJ SOLN: 4.0000 mg | Freq: Four times a day (QID) | INTRAMUSCULAR | Status: DC | PRN

## 2018-09-25 MED ORDER — PROPOFOL 10 MG/ML IV BOLUS
INTRAVENOUS | Status: DC | PRN
  Administered 2018-09-25: 20 mg via INTRAVENOUS

## 2018-09-25 MED ORDER — CEFAZOLIN SODIUM-DEXTROSE 2-4 GM/100ML-% IV SOLN
2.0000 g | INTRAVENOUS | Status: AC
  Administered 2018-09-25: 2 g via INTRAVENOUS
  Filled 2018-09-25: qty 100

## 2018-09-25 MED ORDER — FLEET ENEMA 7-19 GM/118ML RE ENEM: 1.0000 | ENEMA | Freq: Once | RECTAL | Status: DC | PRN

## 2018-09-25 MED ORDER — OXYCODONE HCL 5 MG PO TABS
5.0000 mg | ORAL_TABLET | ORAL | Status: DC | PRN
  Administered 2018-09-25 (×2): 10 mg via ORAL
  Filled 2018-09-25 (×3): qty 2

## 2018-09-25 MED ORDER — SODIUM CHLORIDE 0.9 % IV SOLN
INTRAVENOUS | Status: DC | PRN
  Administered 2018-09-25: 35 ug/min via INTRAVENOUS

## 2018-09-25 MED ORDER — BISACODYL 5 MG PO TBEC: 5.0000 mg | DELAYED_RELEASE_TABLET | Freq: Every day | ORAL | Status: DC | PRN

## 2018-09-25 MED ORDER — BUPIVACAINE IN DEXTROSE 0.75-8.25 % IT SOLN
INTRATHECAL | Status: DC | PRN
  Administered 2018-09-25: 1.6 mL via INTRATHECAL

## 2018-09-25 MED ORDER — LACTATED RINGERS IV SOLN
INTRAVENOUS | Status: DC
  Administered 2018-09-25 (×3): via INTRAVENOUS

## 2018-09-25 MED ORDER — MIDAZOLAM HCL 2 MG/2ML IJ SOLN
INTRAMUSCULAR | Status: AC
  Filled 2018-09-25: qty 2

## 2018-09-25 MED ORDER — TRANEXAMIC ACID 1000 MG/10ML IV SOLN
2000.0000 mg | Freq: Once | INTRAVENOUS | Status: AC
  Administered 2018-09-25: 2000 mg via TOPICAL
  Filled 2018-09-25: qty 20

## 2018-09-25 MED ORDER — METHOCARBAMOL 500 MG PO TABS
500.0000 mg | ORAL_TABLET | Freq: Four times a day (QID) | ORAL | Status: DC | PRN
  Administered 2018-09-26 (×2): 500 mg via ORAL
  Filled 2018-09-25 (×2): qty 1

## 2018-09-25 MED ORDER — METHOCARBAMOL 500 MG IVPB - SIMPLE MED
INTRAVENOUS | Status: AC
  Filled 2018-09-25: qty 50

## 2018-09-25 MED ORDER — ALUM & MAG HYDROXIDE-SIMETH 200-200-20 MG/5ML PO SUSP: 30.0000 mL | ORAL | Status: DC | PRN

## 2018-09-25 MED ORDER — ONDANSETRON HCL 4 MG/2ML IJ SOLN: 4.0000 mg | Freq: Once | INTRAMUSCULAR | Status: DC | PRN

## 2018-09-25 MED ORDER — ONDANSETRON HCL 4 MG/2ML IJ SOLN
INTRAMUSCULAR | Status: DC | PRN
  Administered 2018-09-25: 4 mg via INTRAVENOUS

## 2018-09-25 MED ORDER — OXYCODONE HCL 5 MG/5ML PO SOLN: 5.0000 mg | Freq: Once | ORAL | Status: DC | PRN

## 2018-09-25 MED ORDER — ONDANSETRON HCL 4 MG/2ML IJ SOLN
INTRAMUSCULAR | Status: AC
  Filled 2018-09-25: qty 2

## 2018-09-25 MED ORDER — LACTATED RINGERS IV SOLN: INTRAVENOUS | Status: DC

## 2018-09-25 MED ORDER — BUPIVACAINE LIPOSOME 1.3 % IJ SUSP
INTRAMUSCULAR | Status: DC | PRN
  Administered 2018-09-25: 10 mL

## 2018-09-25 MED ORDER — ONDANSETRON HCL 4 MG PO TABS: 4.0000 mg | ORAL_TABLET | Freq: Four times a day (QID) | ORAL | Status: DC | PRN

## 2018-09-25 MED ORDER — DEXAMETHASONE SODIUM PHOSPHATE 10 MG/ML IJ SOLN
INTRAMUSCULAR | Status: DC | PRN
  Administered 2018-09-25: 10 mg via INTRAVENOUS

## 2018-09-25 MED ORDER — PROPOFOL 10 MG/ML IV BOLUS
INTRAVENOUS | Status: AC
  Filled 2018-09-25: qty 20

## 2018-09-25 MED ORDER — VITAMIN D (ERGOCALCIFEROL) 1.25 MG (50000 UNIT) PO CAPS
50000.0000 [IU] | ORAL_CAPSULE | ORAL | Status: DC
  Filled 2018-09-25: qty 1

## 2018-09-25 MED ORDER — KCL IN DEXTROSE-NACL 20-5-0.45 MEQ/L-%-% IV SOLN
INTRAVENOUS | Status: DC
  Administered 2018-09-25 (×2): via INTRAVENOUS
  Filled 2018-09-25 (×5): qty 1000

## 2018-09-25 MED ORDER — DEXAMETHASONE SODIUM PHOSPHATE 10 MG/ML IJ SOLN: 10.0000 mg | Freq: Once | INTRAMUSCULAR | Status: DC

## 2018-09-25 MED ORDER — TAMSULOSIN HCL 0.4 MG PO CAPS
0.4000 mg | ORAL_CAPSULE | Freq: Every day | ORAL | Status: DC
  Administered 2018-09-25: 0.4 mg via ORAL
  Filled 2018-09-25 (×2): qty 1

## 2018-09-25 MED ORDER — FENTANYL CITRATE (PF) 100 MCG/2ML IJ SOLN: 25.0000 ug | INTRAMUSCULAR | Status: DC | PRN

## 2018-09-25 MED ORDER — METOCLOPRAMIDE HCL 5 MG/ML IJ SOLN: 5.0000 mg | Freq: Three times a day (TID) | INTRAMUSCULAR | Status: DC | PRN

## 2018-09-25 MED ORDER — POLYETHYLENE GLYCOL 3350 17 G PO PACK: 17.0000 g | PACK | Freq: Every day | ORAL | Status: DC | PRN

## 2018-09-25 MED ORDER — TIZANIDINE HCL 2 MG PO TABS: 2.0000 mg | ORAL_TABLET | Freq: Four times a day (QID) | ORAL | 0 refills | Status: DC | PRN

## 2018-09-25 MED ORDER — PANTOPRAZOLE SODIUM 40 MG PO TBEC: 40.0000 mg | DELAYED_RELEASE_TABLET | Freq: Every day | ORAL | Status: DC

## 2018-09-25 MED ORDER — POVIDONE-IODINE 10 % EX SWAB: 2.0000 "application " | Freq: Once | CUTANEOUS | Status: DC

## 2018-09-25 MED ORDER — 0.9 % SODIUM CHLORIDE (POUR BTL) OPTIME
TOPICAL | Status: DC | PRN
  Administered 2018-09-25: 1000 mL

## 2018-09-25 MED ORDER — TRANEXAMIC ACID-NACL 1000-0.7 MG/100ML-% IV SOLN
1000.0000 mg | INTRAVENOUS | Status: AC
  Administered 2018-09-25: 1000 mg via INTRAVENOUS
  Filled 2018-09-25: qty 100

## 2018-09-25 MED ORDER — CHLORHEXIDINE GLUCONATE 4 % EX LIQD: 60.0000 mL | Freq: Once | CUTANEOUS | Status: DC

## 2018-09-25 MED ORDER — GABAPENTIN 300 MG PO CAPS
300.0000 mg | ORAL_CAPSULE | Freq: Three times a day (TID) | ORAL | Status: DC
  Administered 2018-09-25 – 2018-09-26 (×3): 300 mg via ORAL
  Filled 2018-09-25 (×3): qty 1

## 2018-09-25 MED ORDER — HYDROMORPHONE HCL 1 MG/ML IJ SOLN
0.5000 mg | INTRAMUSCULAR | Status: DC | PRN
  Administered 2018-09-25: 0.5 mg via INTRAVENOUS
  Filled 2018-09-25: qty 1

## 2018-09-25 MED ORDER — ASPIRIN EC 81 MG PO TBEC: 81.0000 mg | DELAYED_RELEASE_TABLET | Freq: Two times a day (BID) | ORAL | 0 refills | Status: DC

## 2018-09-25 MED ORDER — ASPIRIN 81 MG PO CHEW
81.0000 mg | CHEWABLE_TABLET | Freq: Two times a day (BID) | ORAL | Status: DC
  Administered 2018-09-25 – 2018-09-26 (×2): 81 mg via ORAL
  Filled 2018-09-25 (×2): qty 1

## 2018-09-25 MED ORDER — IRBESARTAN 75 MG PO TABS
75.0000 mg | ORAL_TABLET | Freq: Every day | ORAL | Status: DC
  Administered 2018-09-25 – 2018-09-26 (×2): 75 mg via ORAL
  Filled 2018-09-25 (×2): qty 1

## 2018-09-25 MED ORDER — CELECOXIB 200 MG PO CAPS
200.0000 mg | ORAL_CAPSULE | Freq: Two times a day (BID) | ORAL | Status: DC
  Administered 2018-09-25 – 2018-09-26 (×2): 200 mg via ORAL
  Filled 2018-09-25 (×2): qty 1

## 2018-09-25 MED ORDER — BUPIVACAINE-EPINEPHRINE 0.25% -1:200000 IJ SOLN
INTRAMUSCULAR | Status: DC | PRN
  Administered 2018-09-25: 30 mL

## 2018-09-25 MED ORDER — CARBAMIDE PEROXIDE 6.5 % OT SOLN: 5.0000 [drp] | Freq: Every day | OTIC | Status: DC | PRN

## 2018-09-25 MED ORDER — METFORMIN HCL 500 MG PO TABS
500.0000 mg | ORAL_TABLET | Freq: Every day | ORAL | Status: DC
  Administered 2018-09-26: 500 mg via ORAL
  Filled 2018-09-25: qty 1

## 2018-09-25 MED ORDER — TRANEXAMIC ACID-NACL 1000-0.7 MG/100ML-% IV SOLN
1000.0000 mg | Freq: Once | INTRAVENOUS | Status: AC
  Administered 2018-09-25: 1000 mg via INTRAVENOUS
  Filled 2018-09-25: qty 100

## 2018-09-25 MED ORDER — MIDAZOLAM HCL 5 MG/5ML IJ SOLN
INTRAMUSCULAR | Status: DC | PRN
  Administered 2018-09-25: 2 mg via INTRAVENOUS

## 2018-09-25 MED ORDER — METOCLOPRAMIDE HCL 5 MG PO TABS: 5.0000 mg | ORAL_TABLET | Freq: Three times a day (TID) | ORAL | Status: DC | PRN

## 2018-09-25 MED ORDER — OXYCODONE HCL 5 MG PO TABS: 5.0000 mg | ORAL_TABLET | Freq: Once | ORAL | Status: DC | PRN

## 2018-09-25 MED ORDER — ACETAMINOPHEN 325 MG PO TABS: 325.0000 mg | ORAL_TABLET | Freq: Four times a day (QID) | ORAL | Status: DC | PRN

## 2018-09-25 MED ORDER — PANTOPRAZOLE SODIUM 40 MG PO TBEC
40.0000 mg | DELAYED_RELEASE_TABLET | Freq: Every day | ORAL | Status: DC
  Administered 2018-09-26: 40 mg via ORAL
  Filled 2018-09-25: qty 1

## 2018-09-25 MED ORDER — OXYCODONE-ACETAMINOPHEN 5-325 MG PO TABS: 1.0000 | ORAL_TABLET | ORAL | 0 refills | Status: DC | PRN

## 2018-09-25 MED ORDER — SODIUM CHLORIDE 0.9% FLUSH
INTRAVENOUS | Status: DC | PRN
  Administered 2018-09-25: 70 mL

## 2018-09-25 MED ORDER — LORATADINE 10 MG PO TABS: 10.0000 mg | ORAL_TABLET | Freq: Every day | ORAL | Status: DC | PRN

## 2018-09-25 MED ORDER — EPHEDRINE SULFATE 50 MG/ML IJ SOLN
INTRAMUSCULAR | Status: DC | PRN
  Administered 2018-09-25 (×2): 10 mg via INTRAVENOUS

## 2018-09-25 MED ORDER — FLUTICASONE PROPIONATE 50 MCG/ACT NA SUSP: 2.0000 | Freq: Every day | NASAL | Status: DC | PRN

## 2018-09-25 MED ORDER — DOCUSATE SODIUM 100 MG PO CAPS
100.0000 mg | ORAL_CAPSULE | Freq: Two times a day (BID) | ORAL | Status: DC
  Administered 2018-09-25: 100 mg via ORAL
  Filled 2018-09-25 (×2): qty 1

## 2018-09-25 MED ORDER — PROPOFOL 500 MG/50ML IV EMUL
INTRAVENOUS | Status: DC | PRN
  Administered 2018-09-25: 75 ug/kg/min via INTRAVENOUS

## 2018-09-25 MED ORDER — BUPIVACAINE-EPINEPHRINE (PF) 0.25% -1:200000 IJ SOLN
INTRAMUSCULAR | Status: AC
  Filled 2018-09-25: qty 30

## 2018-09-25 MED ORDER — METHOCARBAMOL 500 MG IVPB - SIMPLE MED
500.0000 mg | Freq: Four times a day (QID) | INTRAVENOUS | Status: DC | PRN
  Administered 2018-09-25: 500 mg via INTRAVENOUS
  Filled 2018-09-25: qty 50

## 2018-09-25 MED ORDER — PHENOL 1.4 % MT LIQD: 1.0000 | OROMUCOSAL | Status: DC | PRN

## 2018-09-25 MED ORDER — MENTHOL 3 MG MT LOZG: 1.0000 | LOZENGE | OROMUCOSAL | Status: DC | PRN

## 2018-09-25 MED ORDER — FENTANYL CITRATE (PF) 100 MCG/2ML IJ SOLN
INTRAMUSCULAR | Status: AC
  Filled 2018-09-25: qty 2

## 2018-09-25 MED ORDER — INSULIN ASPART 100 UNIT/ML ~~LOC~~ SOLN
0.0000 [IU] | Freq: Three times a day (TID) | SUBCUTANEOUS | Status: DC
  Administered 2018-09-25: 3 [IU] via SUBCUTANEOUS

## 2018-09-25 MED ORDER — ATORVASTATIN CALCIUM 40 MG PO TABS
40.0000 mg | ORAL_TABLET | Freq: Every day | ORAL | Status: DC
  Administered 2018-09-25: 40 mg via ORAL
  Filled 2018-09-25 (×2): qty 1

## 2018-09-25 SURGICAL SUPPLY — 48 items
BAG DECANTER FOR FLEXI CONT (MISCELLANEOUS) ×2 IMPLANT
BLADE SAW SGTL 18X1.27X75 (BLADE) ×2 IMPLANT
BLADE SURG SZ10 CARB STEEL (BLADE) ×4 IMPLANT
CONT SPEC 4OZ CLIKSEAL STRL BL (MISCELLANEOUS) IMPLANT
COVER PERINEAL POST (MISCELLANEOUS) ×2 IMPLANT
COVER SURGICAL LIGHT HANDLE (MISCELLANEOUS) ×2 IMPLANT
COVER WAND RF STERILE (DRAPES) IMPLANT
CUP ACETBLR 52 OD 100 SERIES (Hips) ×2 IMPLANT
DECANTER SPIKE VIAL GLASS SM (MISCELLANEOUS) ×4 IMPLANT
DRAPE STERI IOBAN 125X83 (DRAPES) ×2 IMPLANT
DRAPE U-SHAPE 47X51 STRL (DRAPES) ×4 IMPLANT
DRSG AQUACEL AG ADV 3.5X10 (GAUZE/BANDAGES/DRESSINGS) ×2 IMPLANT
DURAPREP 26ML APPLICATOR (WOUND CARE) ×2 IMPLANT
ELECT BLADE TIP CTD 4 INCH (ELECTRODE) ×2 IMPLANT
ELECT REM PT RETURN 15FT ADLT (MISCELLANEOUS) ×2 IMPLANT
ELIMINATOR HOLE APEX DEPUY (Hips) ×2 IMPLANT
GLOVE BIO SURGEON STRL SZ 6.5 (GLOVE) ×2 IMPLANT
GLOVE BIO SURGEON STRL SZ7.5 (GLOVE) ×2 IMPLANT
GLOVE BIO SURGEON STRL SZ8.5 (GLOVE) ×2 IMPLANT
GLOVE BIOGEL PI IND STRL 7.0 (GLOVE) ×1 IMPLANT
GLOVE BIOGEL PI IND STRL 8 (GLOVE) ×1 IMPLANT
GLOVE BIOGEL PI IND STRL 9 (GLOVE) ×1 IMPLANT
GLOVE BIOGEL PI INDICATOR 7.0 (GLOVE) ×1
GLOVE BIOGEL PI INDICATOR 8 (GLOVE) ×1
GLOVE BIOGEL PI INDICATOR 9 (GLOVE) ×1
GOWN STRL REUS W/TWL LRG LVL3 (GOWN DISPOSABLE) ×2 IMPLANT
GOWN STRL REUS W/TWL XL LVL3 (GOWN DISPOSABLE) ×4 IMPLANT
HEAD CERAMIC DELTA 36 PLUS 1.5 (Hips) ×2 IMPLANT
HOLDER FOLEY CATH W/STRAP (MISCELLANEOUS) ×2 IMPLANT
KIT TURNOVER KIT A (KITS) ×2 IMPLANT
LINER NEUTRAL 52X36MM PLUS 4 (Liner) ×2 IMPLANT
MANIFOLD NEPTUNE II (INSTRUMENTS) ×2 IMPLANT
NEEDLE HYPO 21X1.5 SAFETY (NEEDLE) ×4 IMPLANT
NS IRRIG 1000ML POUR BTL (IV SOLUTION) IMPLANT
PACK ANTERIOR HIP CUSTOM (KITS) ×2 IMPLANT
STEM FEMORAL SZ6 HIGH ACTIS (Stem) ×2 IMPLANT
SUT ETHIBOND NAB CT1 #1 30IN (SUTURE) ×2 IMPLANT
SUT VIC AB 0 CT1 27 (SUTURE) ×1
SUT VIC AB 0 CT1 27XBRD ANBCTR (SUTURE) ×1 IMPLANT
SUT VIC AB 1 CTX 36 (SUTURE) ×1
SUT VIC AB 1 CTX36XBRD ANBCTR (SUTURE) ×1 IMPLANT
SUT VIC AB 2-0 CT1 27 (SUTURE) ×1
SUT VIC AB 2-0 CT1 TAPERPNT 27 (SUTURE) ×1 IMPLANT
SUT VIC AB 3-0 CT1 27 (SUTURE) ×1
SUT VIC AB 3-0 CT1 TAPERPNT 27 (SUTURE) ×1 IMPLANT
SYR CONTROL 10ML LL (SYRINGE) ×6 IMPLANT
TRAY FOLEY MTR SLVR 16FR STAT (SET/KITS/TRAYS/PACK) ×2 IMPLANT
YANKAUER SUCT BULB TIP 10FT TU (MISCELLANEOUS) ×2 IMPLANT

## 2018-09-25 NOTE — Care Plan (Signed)
Ortho Bundle Case Management Note  Patient Details  Name: Eric [name] MRN: [id number] Date of Birth: 1945/05/16   Patient known to me from previous surgeries. Spoke with him prior to surgery and he will discharge to home with family and go directly to [facility]. This has been arranged at [facility]. He has equipment from prior surgery.  Patient and MD aware of plan and agreeable.  Choice offered.                    DME Arranged:    DME Agency:     HH Arranged:    HH Agency:     Additional Comments: Please contact me with any questions of if this plan should need to change.  Ladell Heads,  [place] [facility] [facility]  [phone] 09/25/2018, 9:08 AM

## 2018-09-25 NOTE — Op Note (Signed)
OPERATIVE REPORT    DATE OF PROCEDURE:  09/25/2018       PREOPERATIVE DIAGNOSIS:  Right Hip Osteoarthritis                                                          POSTOPERATIVE DIAGNOSIS:  Right Hip Osteoarthritis                                                           PROCEDURE: Anterior R total hip arthroplasty using a 52 mm DePuy Pinnacle  Cup, [employer], 0-degree polyethylene liner, a +1.5 mm x 3mm ceramic head, a 6 hi Depuy Actis stem   SURGEON: [name]    ASSISTANT:   Kerry Hough. [employer]  (present throughout entire procedure and necessary for timely completion of the procedure)   ANESTHESIA: Spinal BLOOD LOSS: 300 cc FLUID REPLACEMENT: 1600 cc crystalloid Antibiotic: 2gm ancef Tranexamic Acid: 1gm IV, 2gm Topical Exparel: 266mg  COMPLICATIONS: none    INDICATIONS FOR PROCEDURE: A 73 y.o. year-old With  Right Hip Osteoarthritis   for 3 years, x-rays show bone-on-bone arthritic changes, and osteophytes. Despite conservative measures with observation, anti-inflammatory medicine, narcotics, use of a cane, has severe unremitting pain and can ambulate only a few blocks before resting. Patient desires elective R total hip arthroplasty to decrease pain and increase function. The risks, benefits, and alternatives were discussed at length including but not limited to the risks of infection, bleeding, nerve injury, stiffness, blood clots, the need for revision surgery, cardiopulmonary complications, among others, and they were willing to proceed. Questions answered      PROCEDURE IN DETAIL: The patient was identified by armband,   received preoperative IV antibiotics in the holding area at [facility], taken to the operating room , appropriate anesthetic monitors   were attached and  anesthesia was induced with the patient on the gurney. The HANA boots were applied to the feet and the patient  was transferred to the HANA table with a peroneal post and support  underneath the non-operative leg, which was locked in 2 lb traction. Theoperative lower extremity was then prepped and draped in the usual sterile fashion from just above the iliac crest to the knee. And a timeout procedure was performed. We then made a 12 cm incision along the interval at the leading edge of the tensor fascia lata of starting at 2 cm lateral to the ASIS. Small bleeders in the skin and subcutaneous tissue identified and cauterized we dissected down to the fascia and made an incision in the fascia allowing [place] to elevate the fascia of the tensor muscle and exploited the interval between the rectus and the tensor fascia lata. A Cobra retractor was then placed along the superior neck of the femur. A cerebellar retractor was used to expose the interval between the tensor fascia lata and the rectus femoris. .  We identified and cauterized the ascending branch of the anterior circumflex artery. A second Cobra retractor along the inferior neck of the femur. A small Hohmann retractor was placed underneath the origin of the rectus femoris, giving [place]  good medial exposure. Using Ronguers fatty tissue was removed from in front of the anterior capsule. The capsule was then incised, starting out at the superior anterior aspect of the acetabulum going laterally along the anterior neck. The capsule was then teed along the neck superiorly and inferiorly. Electrocautery was used to release capsule from the anterior and medial neck of the femur to allow external rotation. Cobra retractors were then placed along the inferior and superior neck allowing [place] to perform a standard neck cut and removed the femoral head with a power corkscrew. We then placed a medium bent homan retractor in the cotyloid notch and posteriorly along the acetabular rim a narrow Cobra retractor. Exposed labral tissue was then removed with the electrocautery. We then sequentially reamed up to a 51 mm basket reamer obtaining good coverage in all  quadrants, verified by C-arm imaging. Under C-arm control we then hammered into place a 52 mm Pinnacle cup in 45 of abduction and 15 of anteversion. The cup seated nicely and required no supplemental screws. We then placed a central hole Eliminator and a 0 polyethylene liner. The foot was then externally rotated to 130-140. The limb was extended and adducted delivering the proximal femur up into the wound. A medium curved Hohmann retractor was placed over the greater trochanter and a long Homan retractor along the posterior femoral neck completing the exposure. We then performed releases superiorly and and inferiorly of the capsule going back to the pirformis fossa superiorly and to the lesser trochanter inferiorly. We then entered the proximal femur with the box cutting offset chisel followed by, a canal sounder, the chili pepper and broaching up to a 6hi broach. This seated nicely and we reamed the calcar. A trial reduction was performed with a 1.5 mm 36 mm head.The limb lengths were excellent the hip was stable in 90 of external rotation. At this point the trial components removed and we hammered into place a # 6hi  Offset Actis stem with Gryption coating. A +1.5 mm x 36 ceramic head was then hammered into place. The hip was reduced and final C-arm images obtained. The wound was thoroughly irrigated with normal saline solution. We repaired the ant capsule and the tensor fascia lot a with running 0 vicryl suture. the subcutaneous tissue was closed with 2-0 and 3-0 Vicryl suture followed by an Aquacil dressing. At this point the patient was awaken and transferred to hospital gurney without difficulty.   [name] 09/25/2018, 11:08 AM

## 2018-09-25 NOTE — Discharge Instructions (Signed)

## 2018-09-25 NOTE — Progress Notes (Signed)
Pt. set up with/placed on CPAP for h/s, tolerating well, made aware to notify if needed.

## 2018-09-25 NOTE — Evaluation (Signed)
Physical Therapy Evaluation Patient Details Name: Eric [name] MRN: [id number] DOB: [birth year] Today's Date: 09/25/2018   History of Present Illness  73 yo male s/p R DA-THA on 09/25/18. PMH includes preDM, OA, L THA, R and L shoulder arthroplasty, obesity, alcohol abuse, HTN, HLD.  Clinical Impression   Pt presents with moderate R hip pain, difficulty performing mobility tasks, R hip weakness post-operatively, increased time and effort to perform mobility tasks, and decreased activity tolerance due to R hip pain. Pt to benefit from acute PT to address deficits. Pt ambulated 50 ft with RW with min guard assist, verbal cuing for form and safety provided. Pt educated on ankle pumps (20/hour) to perform this afternoon/evening to increase circulation, to pt's tolerance and limited by pain. PT to progress mobility as tolerated, and will continue to follow acutely.        Follow Up Recommendations Follow surgeon's recommendation for DC plan and follow-up therapies;Supervision for mobility/OOB    Equipment Recommendations  None recommended by PT    Recommendations for Other Services       Precautions / Restrictions Precautions Precautions: Fall Restrictions Weight Bearing Restrictions: No Other Position/Activity Restrictions: WBAT      Mobility  Bed Mobility Overal bed mobility: Needs Assistance Bed Mobility: Supine to Sit     Supine to sit: Min assist;HOB elevated     General bed mobility comments: min assist for RLE management and trunk elevation, pt able to scoot to EOB with increased time. Pt sat EOB ~3 minutes to ensure no dizziness, history of vagal response.  Transfers Overall transfer level: Needs assistance Equipment used: Rolling walker (2 wheeled) Transfers: Sit to/from Stand Sit to Stand: Min guard;From elevated surface         General transfer comment: Min guard for safety, verbal cuing for hand placement when rising.  Ambulation/Gait Ambulation/Gait  assistance: Min guard Gait Distance (Feet): 50 Feet Assistive device: Rolling walker (2 wheeled) Gait Pattern/deviations: Step-to pattern;Step-through pattern;Decreased stride length;Trunk flexed Gait velocity: decr   General Gait Details: Min guard for safety, verbal cuing for upright posture, sequencing, placement in RW.  Stairs            Wheelchair Mobility    Modified Rankin (Stroke Patients Only)       Balance Overall balance assessment: Mild deficits observed, not formally tested                                           Pertinent Vitals/Pain Pain Assessment: 0-10 Pain Score: 5  Pain Location: R hip Pain Descriptors / Indicators: Sore Pain Intervention(s): Limited activity within patient's tolerance;Monitored during session;Premedicated before session;Repositioned;Ice applied    Home Living Family/patient expects to be discharged to:: Private residence Living Arrangements: Spouse/significant other Available Help at Discharge: Family Type of Home: House Home Access: Stairs to enter   Technical brewer of Steps: 1 Home Layout: One level [place]: Toilet riser;Grab bars - tub/shower;Grab bars - toilet;Walker - 2 wheels;Cane - single point      Prior Function Level of Independence: Independent         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   Dominant Hand: Right    Extremity/Trunk Assessment   Upper Extremity Assessment Upper Extremity Assessment: Overall WFL for tasks assessed    Lower Extremity Assessment Lower Extremity Assessment: Overall WFL for tasks assessed;RLE deficits/detail RLE  Deficits / Details: suspected post-surgical weakness; able to perform ankle pumps, quad set, heel slide RLE Sensation: WNL    Cervical / Trunk Assessment Cervical / Trunk Assessment: Normal  Communication   Communication: No difficulties  Cognition Arousal/Alertness: Awake/alert Behavior During Therapy: WFL for tasks  assessed/performed Overall Cognitive Status: Within Functional Limits for tasks assessed                                 General Comments: Pt pleasant and talkative      General Comments      Exercises     Assessment/Plan    PT Assessment Patient needs continued PT services  PT Problem List Decreased strength;Decreased mobility;Decreased range of motion;Decreased activity tolerance;Decreased balance;Decreased knowledge of use of DME;Pain       PT Treatment Interventions DME instruction;Therapeutic activities;Gait training;Therapeutic exercise;Patient/family education;Balance training;Stair training;Functional mobility training    PT Goals (Current goals can be found in the Care Plan section)  Acute Rehab PT Goals Patient Stated Goal: go home asap PT Goal Formulation: With patient Time For Goal Achievement: 10/02/18 Potential to Achieve Goals: Good    Frequency 7X/week   Barriers to discharge        Co-evaluation               AM-PAC PT "6 Clicks" Mobility  Outcome Measure Help needed turning from your back to your side while in a flat bed without using bedrails?: A Little Help needed moving from lying on your back to sitting on the side of a flat bed without using bedrails?: A Little Help needed moving to and from a bed to a chair (including a wheelchair)?: A Little Help needed standing up from a chair using your arms (e.g., wheelchair or bedside chair)?: A Little Help needed to walk in hospital room?: A Little Help needed climbing 3-5 steps with a railing? : A Lot 6 Click Score: 17    End of Session Equipment Utilized During Treatment: Gait belt Activity Tolerance: Patient tolerated treatment well;Patient limited by pain Patient left: in chair;with call bell/phone within reach;with SCD's reapplied(chair alarm battery low) Nurse Communication: Mobility status PT Visit Diagnosis: Other abnormalities of gait and mobility (R26.89);Difficulty in  walking, not elsewhere classified (R26.2)    Time: 9628-3662 PT Time Calculation (min) (ACUTE ONLY): 37 min   Charges:   PT Evaluation $PT Eval Low Complexity: 1 Low PT Treatments $Gait Training: 8-22 mins        Julien Girt, PT Acute Rehabilitation Services Pager [phone]  Office [phone]   Roxine Caddy D Elonda Husky 09/25/2018, 7:39 PM

## 2018-09-25 NOTE — Anesthesia Preprocedure Evaluation (Addendum)
Anesthesia Evaluation  Patient identified by MRN, date of birth, ID band Patient awake    Reviewed: Allergy & Precautions, NPO status , Patient's Chart, lab work & pertinent test results  History of Anesthesia Complications Negative for: history of anesthetic complications  Airway Mallampati: II  TM Distance: >3 FB Neck ROM: Full    Dental  (+) Dental Advisory Given, Teeth Intact   Pulmonary sleep apnea and Continuous Positive Airway Pressure Ventilation ,    breath sounds clear to auscultation       Cardiovascular hypertension, Pt. on medications + dysrhythmias (PACs)  Rhythm:Regular Rate:Normal     Neuro/Psych negative neurological ROS  negative psych ROS   GI/Hepatic GERD  Medicated and Controlled,(+)     substance abuse  alcohol use,   Endo/Other   Obesity Pre-DM   Renal/GU negative Renal ROS     Musculoskeletal  (+) Arthritis ,   Abdominal   Peds  Hematology negative hematology ROS (+)   Anesthesia Other Findings   Reproductive/Obstetrics                            Anesthesia Physical Anesthesia Plan  ASA: III  Anesthesia Plan: Spinal   Post-op Pain Management:    Induction:   PONV Risk Score and Plan: 1 and Treatment may vary due to age or medical condition and Propofol infusion  Airway Management Planned: Natural Airway and Simple Face Mask  Additional Equipment: None  Intra-op Plan:   Post-operative Plan:   Informed Consent: I have reviewed the patients History and Physical, chart, labs and discussed the procedure including the risks, benefits and alternatives for the proposed anesthesia with the patient or authorized representative who has indicated his/her understanding and acceptance.       Plan Discussed with: CRNA and Anesthesiologist  Anesthesia Plan Comments: (Labs reviewed, platelets acceptable. Discussed risks and benefits of spinal, including  spinal/epidural hematoma, infection, failed block, and PDPH. Patient expressed understanding and wished to proceed. )       Anesthesia Quick Evaluation

## 2018-09-25 NOTE — Transfer of Care (Signed)
Immediate Anesthesia Transfer of Care Note  Patient: Eric [name]  Procedure(s) Performed: Right Anterior Hip Arthroplasty (Right Hip)  Patient Location: PACU  Anesthesia Type:MAC and Spinal  Level of Consciousness: awake, alert , oriented and patient cooperative  Airway & Oxygen Therapy: Patient Spontanous Breathing and Patient connected to face mask oxygen  Post-op Assessment: Report given to RN and Post -op Vital signs reviewed and stable  Post vital signs: Reviewed and stable  Last Vitals:  Vitals Value Taken Time  BP    Temp    Pulse 77 09/25/18 1128  Resp 11 09/25/18 1128  SpO2 98 % 09/25/18 1128  Vitals shown include unvalidated device data.  Last Pain:  Vitals:   09/25/18 0816  TempSrc:   PainSc: 0-No pain         Complications: No apparent anesthesia complications

## 2018-09-25 NOTE — Interval H&P Note (Signed)
History and Physical Interval Note:  09/25/2018 9:11 AM  Eric [name]  has presented today for surgery, with the diagnosis of Right Hip Osteoarthritis.  The various methods of treatment have been discussed with the patient and family. After consideration of risks, benefits and other options for treatment, the patient has consented to  Procedure(s): Right Anterior Hip Arthroplasty (Right) as a surgical intervention.  The patient's history has been reviewed, patient examined, no change in status, stable for surgery.  I have reviewed the patient's chart and labs.  Questions were answered to the patient's satisfaction.     [name]

## 2018-09-25 NOTE — Anesthesia Procedure Notes (Signed)
Spinal  Patient location during procedure: OR Start time: 09/25/2018 9:35 AM End time: 09/25/2018 9:29 AM Staffing Anesthesiologist: Audry Pili, MD Performed: anesthesiologist  Preanesthetic Checklist Completed: patient identified, surgical consent, pre-op evaluation, timeout performed, IV checked, risks and benefits discussed and monitors and equipment checked Spinal Block Patient position: sitting Prep: DuraPrep Patient monitoring: heart rate, cardiac monitor, continuous pulse ox and blood pressure Approach: midline Location: L3-4 Injection technique: single-shot Needle Needle type: Quincke  Needle gauge: 22 G Additional Notes Consent was obtained prior to the procedure with all questions answered and concerns addressed. Risks including, but not limited to, bleeding, infection, nerve damage, paralysis, failed block, inadequate analgesia, allergic reaction, high spinal, itching, and headache were discussed and the patient wished to proceed. Functioning IV was confirmed and monitors were applied. Sterile prep and drape, including hand hygiene, mask, and sterile gloves were used. The patient was positioned and the spine was prepped. The skin was anesthetized with lidocaine. First attempt by CRNA unsuccessful. Second attempt by Dr. Fransisco Beau, successful. Free flow of clear CSF was obtained prior to injecting local anesthetic into the CSF. The spinal needle aspirated freely following injection. The needle was carefully withdrawn. The patient tolerated the procedure well.   Renold Don, MD

## 2018-09-25 NOTE — Anesthesia Procedure Notes (Addendum)
Procedure Name: MAC Date/Time: 09/25/2018 9:25 AM Performed by: Lissa Morales, CRNA Pre-anesthesia Checklist: Patient identified, Emergency Drugs available, Suction available, Patient being monitored and Timeout performed Patient Re-evaluated:Patient Re-evaluated prior to induction Oxygen Delivery Method: Simple face mask Placement Confirmation: positive ETCO2

## 2018-09-25 NOTE — Anesthesia Postprocedure Evaluation (Signed)
Anesthesia Post Note  Patient: Eric [name]  Procedure(s) Performed: Right Anterior Hip Arthroplasty (Right Hip)     Patient location during evaluation: PACU Anesthesia Type: Spinal Level of consciousness: awake and alert Pain management: pain level controlled Vital Signs Assessment: post-procedure vital signs reviewed and stable Respiratory status: spontaneous breathing, respiratory function stable and patient connected to nasal cannula oxygen Cardiovascular status: blood pressure returned to baseline and stable Postop Assessment: spinal receding and no apparent nausea or vomiting Anesthetic complications: no    Last Vitals:  Vitals:   09/25/18 1245 09/25/18 1257  BP: 120/69 130/65  Pulse: 63 61  Resp: 16 16  Temp: (!) 36.4 C 36.4 C  SpO2: 97% 100%    Last Pain:  Vitals:   09/25/18 1257  TempSrc: Oral  PainSc:                  Audry Pili

## 2018-09-26 ENCOUNTER — Encounter (HOSPITAL_COMMUNITY): Payer: Self-pay | Admitting: Orthopedic Surgery

## 2018-09-26 DIAGNOSIS — M25551 Pain in right hip: Secondary | ICD-10-CM | POA: Diagnosis not present

## 2018-09-26 DIAGNOSIS — R7303 Prediabetes: Secondary | ICD-10-CM | POA: Diagnosis not present

## 2018-09-26 DIAGNOSIS — M1611 Unilateral primary osteoarthritis, right hip: Secondary | ICD-10-CM | POA: Diagnosis not present

## 2018-09-26 DIAGNOSIS — D62 Acute posthemorrhagic anemia: Secondary | ICD-10-CM | POA: Diagnosis not present

## 2018-09-26 DIAGNOSIS — Z1159 Encounter for screening for other viral diseases: Secondary | ICD-10-CM | POA: Diagnosis not present

## 2018-09-26 DIAGNOSIS — G4730 Sleep apnea, unspecified: Secondary | ICD-10-CM | POA: Diagnosis not present

## 2018-09-26 DIAGNOSIS — M25751 Osteophyte, right hip: Secondary | ICD-10-CM | POA: Diagnosis not present

## 2018-09-26 LAB — BASIC METABOLIC PANEL
Anion gap: 4 — ABNORMAL LOW (ref 5–15)
BUN: 13 mg/dL (ref 8–23)
CO2: 26 mmol/L (ref 22–32)
Calcium: 8.6 mg/dL — ABNORMAL LOW (ref 8.9–10.3)
Chloride: 107 mmol/L (ref 98–111)
Creatinine, Ser: 0.82 mg/dL (ref 0.61–1.24)
GFR calc Af Amer: >60 mL/min (ref >60)
GFR calc non Af Amer: >60 mL/min (ref >60)
Glucose, Bld: 142 mg/dL — ABNORMAL HIGH (ref 70–99)
Potassium: 4.3 mmol/L (ref 3.5–5.1)
Sodium: 137 mmol/L (ref 135–145)

## 2018-09-26 LAB — CBC
HCT: 41.8 % (ref 39.0–52.0)
Hemoglobin: 13.4 g/dL (ref 13.0–17.0)
MCH: 30.6 pg (ref 26.0–34.0)
MCHC: 32.1 g/dL (ref 30.0–36.0)
MCV: 95.4 fL (ref 80.0–100.0)
Platelets: 168 10*3/uL (ref 150–400)
RBC: 4.38 MIL/uL (ref 4.22–5.81)
RDW: 12.7 % (ref 11.5–15.5)
WBC: 12.8 10*3/uL — ABNORMAL HIGH (ref 4.0–10.5)
nRBC: 0 % (ref 0.0–0.2)

## 2018-09-26 LAB — GLUCOSE, CAPILLARY: Glucose-Capillary: 94 mg/dL (ref 70–99)

## 2018-09-26 NOTE — Discharge Summary (Signed)
Patient ID: Eric [name] MRN: [id number] DOB/AGE: [birth year] 73 y.o.  Admit date: 09/25/2018 Discharge date: 09/26/2018  Admission Diagnoses:  Principal Problem:   Osteoarthritis of right hip Active Problems:   Status post total hip replacement, right   Discharge Diagnoses:  Same  Past Medical History:  Diagnosis Date  . Alcohol abuse   . Allergy   . Back pain   . Cataract    forming  . Colon polyps   . Diverticulosis of colon   . DJD (degenerative joint disease)   . Dysrhythmia    Palpations from caffiene- reports he was having PACs due to slim fast wth caffeine - had full cardiac w/u and was told to cut back on caffeine;  now drink decaf bevrerages and no rpeort of recurrence   . Enlarged prostate   . GERD (gastroesophageal reflux disease)   . Hearing loss   . Hip osteoarthritis    Left  . History of hay fever   . Hx of cardiovascular stress test    Lexiscan Myoview (04/2013):  No ischemia, EF 61%; low risk  . Hyperlipemia   . Hypertension   . Insulin resistance    om metfromin   . Internal hemorrhoids without mention of complication   . Leg edema   . Neuroma of foot    left  . Obesity   . Osteoarthrosis, unspecified whether generalized or localized, unspecified site   . Palpitations   . 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 Primary localized osteoarthrosis of right shoulder 05/25/2016  . Recent weight loss    75lbs  . Seasonal allergies   . Sleep apnea    CPAP  . Thyroid nodule    calcified thyroid nodules- bx negative   . Tremor of both hands     Surgeries: Procedure(s): Right Anterior Hip Arthroplasty on 09/25/2018   Consultants:   Discharged Condition: Improved  Hospital Course: Eric [name] is an 73 y.o. male who was admitted 09/25/2018 for operative treatment ofOsteoarthritis of right hip. Patient has severe unremitting pain that affects sleep, daily activities, and work/hobbies. After pre-op clearance the patient was taken to  the operating room on 09/25/2018 and underwent  Procedure(s): Right Anterior Hip Arthroplasty.    Patient was given perioperative antibiotics:  Anti-infectives (From admission, onward)   Start     Dose/Rate Route Frequency Ordered Stop   09/25/18 0745  ceFAZolin (ANCEF) IVPB 2g/100 mL premix     2 g 200 mL/hr over 30 Minutes Intravenous On call to O.R. 09/25/18 8101 09/25/18 7510       Patient was given sequential compression devices, early ambulation, and chemoprophylaxis to prevent DVT.  Patient benefited maximally from hospital stay and there were no complications.    Recent vital signs:  Patient Vitals for the past 24 hrs:  BP Temp Temp src Pulse Resp SpO2  09/26/18 0910 133/73 98.4 F (36.9 C) Oral (!) 57 16 99 %  09/26/18 0450 105/60 98.3 F (36.8 C) Oral (!) 50 16 98 %  09/26/18 0139 (!) 106/59 98.8 F (37.1 C) Oral (!) 52 16 99 %  09/25/18 2148 119/67 97.8 F (36.6 C) Oral (!) 56 18 99 %  09/25/18 1811 (!) 138/94 98.5 F (36.9 C) - 91 16 97 %  09/25/18 1546 (!) 147/80 97.7 F (36.5 C) - 64 18 100 %  09/25/18 1407 123/66 97.6 F (36.4 C) - 60 16 100 %  09/25/18 1257 130/65 97.6 F (36.4 C) Oral 61 16  100 %  09/25/18 1245 120/69 (!) 97.5 F (36.4 C) - 63 16 97 %  09/25/18 1230 119/64 - - 60 (!) 21 98 %  09/25/18 1215 (!) 114/96 - - 67 15 100 %  09/25/18 1200 113/67 - - 79 17 100 %  09/25/18 1145 109/71 - - 73 17 100 %  09/25/18 1130 (!) 101/59 97.6 F (36.4 C) - 71 11 99 %     Recent laboratory studies:  Recent Labs    09/26/18 0326  WBC 12.8*  HGB 13.4  HCT 41.8  PLT 168  NA 137  K 4.3  CL 107  CO2 26  BUN 13  CREATININE 0.82  GLUCOSE 142*  CALCIUM 8.6*     Discharge Medications:   Allergies as of 09/26/2018      Reactions   Lisinopril Cough      Medication List    STOP taking these medications   meloxicam 15 MG tablet Commonly known as: MOBIC   NONFORMULARY OR COMPOUNDED ITEM     TAKE these medications   aspirin EC 81 MG tablet Take  1 tablet (81 mg total) by mouth 2 (two) times daily. What changed: when to take this   atorvastatin 40 MG tablet Commonly known as: LIPITOR TAKE 1 TABLET DAILY   carbamide peroxide 6.5 % OTIC solution Commonly known as: DEBROX Place 5 drops into both ears daily as needed (for ear wax).   CoQ10 200 MG Caps Take 200 mg by mouth at bedtime.   fluocinonide ointment 0.05 % Commonly known as: LIDEX Apply 1 application topically daily as needed (for bites).   fluticasone 50 MCG/ACT nasal spray Commonly known as: FLONASE Place 2 sprays into both nostrils daily as needed for allergies.   loratadine 10 MG tablet Commonly known as: CLARITIN Take 10 mg by mouth daily as needed for allergies.   metFORMIN 500 MG tablet Commonly known as: GLUCOPHAGE Take 1 tablet (500 mg total) by mouth daily with breakfast.   oxyCODONE-acetaminophen 5-325 MG tablet Commonly known as: PERCOCET/ROXICET Take 1 tablet by mouth every 4 (four) hours as needed for severe pain.   pantoprazole 40 MG tablet Commonly known as: PROTONIX Take 1 tablet (40 mg total) by mouth daily.   tamsulosin 0.4 MG Caps capsule Commonly known as: FLOMAX TAKE 1 CAPSULE DAILY What changed:   how much to take  how to take this  when to take this  additional instructions   tiZANidine 2 MG tablet Commonly known as: ZANAFLEX Take 1 tablet (2 mg total) by mouth every 6 (six) hours as needed.   valsartan 80 MG tablet Commonly known as: DIOVAN TAKE 1 TABLET DAILY   Vitamin D (Ergocalciferol) 1.25 MG (50000 UT) Caps capsule Commonly known as: DRISDOL Take 1 capsule (50,000 Units total) by mouth every 7 (seven) days.            Durable Medical Equipment  (From admission, onward)         Start     Ordered   09/25/18 1256  DME Walker rolling  Once    Question:  Patient needs a walker to treat with the following condition  Answer:  Status post right hip replacement   09/25/18 1255   09/25/18 1256  DME 3 n 1   Once     09/25/18 1255           Discharge Care Instructions  (From admission, onward)         Start  Ordered   09/26/18 0000  Weight bearing as tolerated     09/26/18 1013          Diagnostic Studies: Dg Chest 2 View  Result Date: 09/21/2018 CLINICAL DATA:  Preoperative EXAM: CHEST - 2 VIEW COMPARISON:  03/16/2017 FINDINGS: The heart size and mediastinal contours are within normal limits. Both lungs are clear. The visualized skeletal structures are unremarkable. IMPRESSION: No acute abnormality of the lungs. Electronically Signed   By: Eddie Candle M.D.   On: 09/21/2018 11:38   Dg C-arm 1-60 Min-no Report  Result Date: 09/25/2018 Fluoroscopy was utilized by the requesting physician.  No radiographic interpretation.   Dg Hip Operative Unilat With Pelvis Right  Result Date: 09/25/2018 CLINICAL DATA:  Right anterior hip replacement.  Surgery, elective. EXAM: OPERATIVE right HIP (WITH PELVIS IF PERFORMED) 3 VIEWS TECHNIQUE: Fluoroscopic spot image(s) were submitted for interpretation post-operatively. COMPARISON:  None. FINDINGS: Right total hip arthroplasty is noted. The hip appears located on this single view. No acute fractures are present. IMPRESSION: Right total hip arthroplasty without radiographic evidence for complication. Electronically Signed   By: San Morelle M.D.   On: 09/25/2018 11:09    Disposition: Discharge disposition: 01-Home or Self Care       Discharge Instructions    Call MD / Call 911   Complete by: As directed    If you experience chest pain or shortness of breath, CALL 911 and be transported to the hospital emergency room.  If you develope a fever above 101 F, pus (white drainage) or increased drainage or redness at the wound, or calf pain, call your surgeon's office.   Constipation Prevention   Complete by: As directed    Drink plenty of fluids.  Prune juice may be helpful.  You may use a stool softener, such as Colace (over the counter)  100 mg twice a day.  Use MiraLax (over the counter) for constipation as needed.   Diet - low sodium heart healthy   Complete by: As directed    Driving restrictions   Complete by: As directed    No driving for 2 weeks   Increase activity slowly as tolerated   Complete by: As directed    Patient may shower   Complete by: As directed    You may shower without a dressing once there is no drainage.  Do not wash over the wound.  If drainage remains, cover wound with plastic wrap and then shower.   Weight bearing as tolerated   Complete by: As directed       Follow-up Information    Frederik Pear, MD. Go on 10/10/2018.   Specialty: Orthopedic Surgery Why: Your appointment has been scheduled for 9:00.   Contact information: [place] [address] [phone]        Re[facility], [place] on 09/27/2018.   Specialty: Rehabilitation Why: You are scheduled to start outpatient physical therapy at 12:30. Please arrive a few minutes early to complete your paperwork.  Contact information: [facility] [place] [address] [phone]        Frederik Pear, MD In 2 weeks.   Specialty: Orthopedic Surgery Contact information: [place] [address] [phone]            Signed: Joanell Rising 09/26/2018, 10:14 AM

## 2018-09-26 NOTE — Progress Notes (Signed)
PATIENT ID: Eric [name]  MRN: [id number]  DOB/AGE:  72-27-1947 / 73 y.o.  1 Day Post-Op Procedure(s) (LRB): Right Anterior Hip Arthroplasty (Right)    PROGRESS NOTE Subjective: Patient is alert, oriented, no Nausea, no Vomiting, yes passing gas, . Taking PO well. Denies SOB, Chest or Calf Pain. Using Incentive Spirometer, PAS in place. Ambulate 55' Patient reports pain as  2/10  .    Objective: Vital signs in last 24 hours: Vitals:   09/25/18 1811 09/25/18 2148 09/26/18 0139 09/26/18 0450  BP: (Abnormal) 138/94 119/67 (Abnormal) 106/59 105/60  Pulse: 91 (Abnormal) 56 (Abnormal) 52 (Abnormal) 50  Resp: 16 18 16 16   Temp: 98.5 F (36.9 C) 97.8 F (36.6 C) 98.8 F (37.1 C) 98.3 F (36.8 C)  TempSrc:  Oral Oral Oral  SpO2: 97% 99% 99% 98%  Weight:      Height:          Intake/Output from previous day: I/O last 3 completed shifts: In: 6506.8 [P.O.:480; I.V.:3951.8; Other:1825; IV Piggyback:250] Out: 4000 [Urine:3550; Blood:450]   Intake/Output this shift: No intake/output data recorded.   LABORATORY DATA: Recent Labs    09/25/18 0811 09/25/18 1709 09/25/18 2203 09/26/18 0326  WBC  --   --   --  12.8*  HGB  --   --   --  13.4  HCT  --   --   --  41.8  PLT  --   --   --  168  NA  --   --   --  137  K  --   --   --  4.3  CL  --   --   --  107  CO2  --   --   --  26  BUN  --   --   --  13  CREATININE  --   --   --  0.82  GLUCOSE  --   --   --  142*  GLUCAP 105* 187* 168*  --   CALCIUM  --   --   --  8.6*    Examination: Neurologically intact ABD soft Neurovascular intact Sensation intact distally Intact pulses distally Dorsiflexion/Plantar flexion intact Incision: dressing C/D/I No cellulitis present Compartment soft} XR AP&Lat of hip shows well placed\fixed THA  Assessment:   1 Day Post-Op Procedure(s) (LRB): Right Anterior Hip Arthroplasty (Right) ADDITIONAL DIAGNOSIS:  Expected Acute Blood Loss Anemia, Sleep Apnea  Patient's anticipated LOS is  less than 2 midnights, meeting these requirements: - Younger than 31 - Lives within 1 hour of care - Has a competent adult at home to recover with post-op recover - NO history of  - Chronic pain requiring opiods  - Diabetes  - Coronary Artery Disease  - Heart failure  - Heart attack  - Stroke  - DVT/VTE  - Cardiac arrhythmia  - Respiratory Failure/COPD  - Renal failure  - Anemia  - Advanced Liver disease       Plan: PT/OT WBAT, THA  DVT Prophylaxis: SCDx72 hrs, ASA 81 mg BID x 2 weeks  DISCHARGE PLAN: Home, today after PT  DISCHARGE NEEDS: HHPT, Walker and 3-in-1 comode seat

## 2018-09-26 NOTE — Progress Notes (Signed)
RN reviewed discharge instructions with patient and family. All questions answered.   Paperwork and prescriptions given.   NT rolled patient down with all belongings to family car. 

## 2018-09-26 NOTE — Progress Notes (Signed)
Physical Therapy Treatment Patient Details Name: Eric [name] MRN: [id number] DOB: 02-Jul-1945 Today's Date: 09/26/2018    History of Present Illness 73 yo male s/p R DA-THA on 09/25/18. PMH includes preDM, OA, L THA, R and L shoulder arthroplasty, obesity, alcohol abuse, HTN, HLD.    PT Comments    Pt ambulated in hallway and practiced one step.  Pt also performed LE exercises and provided with HEP.  Pt feels ready for d/c home today.  Follow Up Recommendations  Follow surgeon's recommendation for DC plan and follow-up therapies;Supervision for mobility/OOB     Equipment Recommendations  None recommended by PT    Recommendations for Other Services       Precautions / Restrictions Precautions Precautions: Fall Restrictions Other Position/Activity Restrictions: WBAT    Mobility  Bed Mobility               General bed mobility comments: pt up in recliner on arrival  Transfers Overall transfer level: Needs assistance Equipment used: Rolling walker (2 wheeled) Transfers: Sit to/from Stand Sit to Stand: Supervision         General transfer comment: supervision for safety  Ambulation/Gait Ambulation/Gait assistance: Min guard;Supervision Gait Distance (Feet): 140 Feet Assistive device: Rolling walker (2 wheeled) Gait Pattern/deviations: Step-through pattern;Decreased stride length;Trunk flexed     General Gait Details: verbal cues for upright posture, sequencing, placement in RW.   Stairs Stairs: Yes Stairs assistance: Min guard Stair Management: Step to pattern;With walker;Forwards Number of Stairs: 1 General stair comments: verbal cues for safety and sequence, pt reports understanding   Wheelchair Mobility    Modified Rankin (Stroke Patients Only)       Balance                                            Cognition Arousal/Alertness: Awake/alert Behavior During Therapy: WFL for tasks assessed/performed Overall Cognitive  Status: Within Functional Limits for tasks assessed                                 General Comments: Pt pleasant and talkative      Exercises Total Joint Exercises Ankle Circles/Pumps: AROM;Both;10 reps Quad Sets: Both;10 reps;AROM Heel Slides: AAROM;Both;10 reps;Right Hip ABduction/ADduction: AROM;10 reps;Standing;Supine;Right Long Arc Quad: AROM;10 reps;Right;Seated Knee Flexion: AROM;Standing;Right;10 reps Marching in Standing: 10 reps;AROM;Standing;Right Standing Hip Extension: AROM;10 reps;Right;Standing    General Comments        Pertinent Vitals/Pain Pain Assessment: 0-10 Pain Score: 4  Pain Location: R hip Pain Descriptors / Indicators: Sore;Tightness Pain Intervention(s): Monitored during session;Repositioned;Ice applied    Home Living                      Prior Function            PT Goals (current goals can now be found in the care plan section) Progress towards PT goals: Progressing toward goals    Frequency    7X/week      PT Plan Current plan remains appropriate    Co-evaluation              AM-PAC PT "6 Clicks" Mobility   Outcome Measure  Help needed turning from your back to your side while in a flat bed without using bedrails?: A Little Help needed moving from lying on your  back to sitting on the side of a flat bed without using bedrails?: A Little Help needed moving to and from a bed to a chair (including a wheelchair)?: A Little Help needed standing up from a chair using your arms (e.g., wheelchair or bedside chair)?: A Little Help needed to walk in hospital room?: A Little Help needed climbing 3-5 steps with a railing? : A Little 6 Click Score: 18    End of Session Equipment Utilized During Treatment: Gait belt Activity Tolerance: Patient tolerated treatment well Patient left: in chair;with call bell/phone within reach Nurse Communication: Mobility status PT Visit Diagnosis: Other abnormalities of gait  and mobility (R26.89);Difficulty in walking, not elsewhere classified (R26.2)     Time: 0940-1000 PT Time Calculation (min) (ACUTE ONLY): 20 min  Charges:  $Therapeutic Exercise: 8-22 mins           Carmelia Bake, PT, DPT Acute Rehabilitation Services Office: [phone] Pager: [phone]  Trena Platt 09/26/2018, 1:31 PM

## 2018-09-26 NOTE — TOC Transition Note (Signed)
Transition of Care [facility]) - CM/SW Discharge Note   Patient Details  Name: Eric [name] MRN: [id number] Date of Birth: October 04, 1945  [facility] CM/SW Contact:  Leeroy Cha, RN Phone Number: 09/26/2018, 10:37 AM   Clinical Narrative:    Discharged to home. [facility] [phone]. Has needed equipment.   Final next level of care: OP Rehab Barriers to Discharge: No Barriers Identified   Patient Goals and CMS Choice Patient states their goals for this hospitalization and ongoing recovery are:: to go home and stasrt moving CMS Medicare.gov Compare Post Acute Care list provided to:: Patient Choice offered to / list presented to : Patient  Discharge Placement                       Discharge Plan and Services   Discharge Planning Services: CM Consult Post Acute Care Choice: Durable Medical Equipment, Home Health          DME Arranged: Walker rolling, 3-N-1 DME Agency: [employer] Date DME Agency Contacted: 09/26/18 Time DME Agency Contacted: 2774 Representative spoke with at DME Agency: [facility] HH Arranged: PT [place]: [facility] [employer] Date [place]: 09/26/18 Time [place]: [phone] Representative spoke with at [place]: [place] Interventions     Readmission Risk Interventions No flowsheet data found.

## 2018-09-27 ENCOUNTER — Other Ambulatory Visit: Payer: Self-pay

## 2018-09-27 ENCOUNTER — Telehealth (INDEPENDENT_AMBULATORY_CARE_PROVIDER_SITE_OTHER): Payer: Medicare Other | Admitting: Family Medicine

## 2018-09-27 ENCOUNTER — Encounter (INDEPENDENT_AMBULATORY_CARE_PROVIDER_SITE_OTHER): Payer: Self-pay | Admitting: Family Medicine

## 2018-09-27 DIAGNOSIS — Z789 Other specified health status: Secondary | ICD-10-CM | POA: Diagnosis not present

## 2018-09-27 DIAGNOSIS — I959 Hypotension, unspecified: Secondary | ICD-10-CM

## 2018-09-27 DIAGNOSIS — Z96611 Presence of right artificial shoulder joint: Secondary | ICD-10-CM | POA: Diagnosis not present

## 2018-09-27 DIAGNOSIS — Z6837 Body mass index (BMI) 37.0-37.9, adult: Secondary | ICD-10-CM | POA: Diagnosis not present

## 2018-09-27 DIAGNOSIS — M2560 Stiffness of unspecified joint, not elsewhere classified: Secondary | ICD-10-CM | POA: Diagnosis not present

## 2018-09-27 DIAGNOSIS — Z96641 Presence of right artificial hip joint: Secondary | ICD-10-CM | POA: Diagnosis not present

## 2018-09-27 DIAGNOSIS — Z7409 Other reduced mobility: Secondary | ICD-10-CM | POA: Diagnosis not present

## 2018-09-27 DIAGNOSIS — M25551 Pain in right hip: Secondary | ICD-10-CM | POA: Diagnosis not present

## 2018-09-27 DIAGNOSIS — Z7389 Other problems related to life management difficulty: Secondary | ICD-10-CM | POA: Diagnosis not present

## 2018-09-27 DIAGNOSIS — R29898 Other symptoms and signs involving the musculoskeletal system: Secondary | ICD-10-CM | POA: Diagnosis not present

## 2018-09-27 DIAGNOSIS — Z96642 Presence of left artificial hip joint: Secondary | ICD-10-CM | POA: Diagnosis not present

## 2018-09-27 DIAGNOSIS — R2689 Other abnormalities of gait and mobility: Secondary | ICD-10-CM | POA: Diagnosis not present

## 2018-09-28 NOTE — Progress Notes (Signed)
Office: [phone]  /  Fax: [phone] TeleHealth Visit:  Eric [name] has verbally consented to this TeleHealth visit today. The patient is located at home, the provider is located at the [facility] office. The participants in this visit include the listed provider and patient and any and all parties involved. The visit was conducted today via FaceTime.  HPI:   Chief Complaint: OBESITY Eric [name] is here to discuss his progress with his obesity treatment plan. He is on the Category 3 plan and is following his eating plan approximately 98 % of the time. He states he did physical therapy. Eric [name] has done well with weight loss on his Category 3 plan. Hunger is controlled and he weighed 240 pounds before surgery three days ago. He is starting physical therapy next week. We were unable to weigh the patient today for this TeleHealth visit. He feels as if he has lost weight since his last visit. He has lost 48 lbs since starting treatment with [place].  Hypotension Eric [name] is status post operative day two from hip replacement and he went to physical therapy today. He is on pain medications and his blood pressure at home was 92/50. Due to his pain medications, he is not sure if he feels lightheaded. He denies chest pain.  ASSESSMENT AND PLAN:  Hypotension, unspecified hypotension type  Class 2 severe obesity with serious comorbidity and body mass index (BMI) of 37.0 to 37.9 in adult, unspecified obesity type ([place])  PLAN:  Hypotension Eric [name] was encouraged to increase his water intake and check his blood pressure two times a day for now. If his blood pressure stays low, he needs to discuss this with his PCP in the next 1 to 2 days. Eric [name] agrees to follow up with our clinic at the agreed upon time.  I spent > than 50% of the 15 minute visit on counseling as documented in the note.  Obesity Eric [name] is currently in the action stage of change. As such, his goal is to  continue with weight loss efforts He has agreed to follow the Category 3 plan Eric [name] has been instructed to work up to a goal of 150 minutes of combined cardio and strengthening exercise per week for weight loss and overall health benefits. We discussed the following Behavioral Modification Strategies today: increasing lean protein intake and increase H2O intake  Eric [name] has agreed to follow up with our clinic in 2 weeks. He was informed of the importance of frequent follow up visits to maximize his success with intensive lifestyle modifications for his multiple health conditions.  ALLERGIES: Allergies  Allergen Reactions  . Lisinopril Cough    MEDICATIONS: Current Outpatient Medications on File Prior to Visit  Medication Sig Dispense Refill  . aspirin EC 81 MG tablet Take 1 tablet (81 mg total) by mouth 2 (two) times daily. 60 tablet 0  . atorvastatin (LIPITOR) 40 MG tablet TAKE 1 TABLET DAILY (Patient taking differently: Take 40 mg by mouth daily. ) 90 tablet 4  . carbamide peroxide (DEBROX) 6.5 % OTIC solution Place 5 drops into both ears daily as needed (for ear wax).    . Coenzyme Q10 (COQ10) 200 MG CAPS Take 200 mg by mouth at bedtime.    . fluocinonide ointment (LIDEX) 8.54 % Apply 1 application topically daily as needed (for bites). 30 g 3  . fluticasone (FLONASE) 50 MCG/ACT nasal spray Place 2 sprays into both nostrils daily as needed for allergies.     [name] loratadine (CLARITIN)  10 MG tablet Take 10 mg by mouth daily as needed for allergies.     . metFORMIN (GLUCOPHAGE) 500 MG tablet Take 1 tablet (500 mg total) by mouth daily with breakfast. 30 tablet 0  . oxyCODONE-acetaminophen (PERCOCET/ROXICET) 5-325 MG tablet Take 1 tablet by mouth every 4 (four) hours as needed for severe pain. 30 tablet 0  . pantoprazole (PROTONIX) 40 MG tablet Take 1 tablet (40 mg total) by mouth daily. 90 tablet 3  . tamsulosin (FLOMAX) 0.4 MG CAPS capsule TAKE 1 CAPSULE DAILY (Patient taking differently:  Take 0.4 mg by mouth daily. ) 90 capsule 3  . tiZANidine (ZANAFLEX) 2 MG tablet Take 1 tablet (2 mg total) by mouth every 6 (six) hours as needed. 60 tablet 0  . valsartan (DIOVAN) 80 MG tablet TAKE 1 TABLET DAILY (Patient taking differently: Take 80 mg by mouth daily. ) 90 tablet 3  . Vitamin D, Ergocalciferol, (DRISDOL) 1.25 MG (50000 UT) CAPS capsule Take 1 capsule (50,000 Units total) by mouth every 7 (seven) days. 4 capsule 0   No current facility-administered medications on file prior to visit.     PAST MEDICAL HISTORY: Past Medical History:  Diagnosis Date  . Alcohol abuse   . Allergy   . Back pain   . Cataract    forming  . Colon polyps   . Diverticulosis of colon   . DJD (degenerative joint disease)   . Dysrhythmia    Palpations from caffiene- reports he was having PACs due to slim fast wth caffeine - had full cardiac w/u and was told to cut back on caffeine;  now drink decaf bevrerages and no rpeort of recurrence   . Enlarged prostate   . GERD (gastroesophageal reflux disease)   . Hearing loss   . Hip osteoarthritis    Left  . History of hay fever   . Hx of cardiovascular stress test    Lexiscan Myoview (04/2013):  No ischemia, EF 61%; low risk  . Hyperlipemia   . Hypertension   . Insulin resistance    om metfromin   . Internal hemorrhoids without mention of complication   . Leg edema   . Neuroma of foot    left  . Obesity   . Osteoarthrosis, unspecified whether generalized or localized, unspecified site   . Palpitations   . 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 Primary localized osteoarthrosis of right shoulder 05/25/2016  . Recent weight loss    75lbs  . Seasonal allergies   . Sleep apnea    CPAP  . Thyroid nodule    calcified thyroid nodules- bx negative   . Tremor of both hands     PAST SURGICAL HISTORY: Past Surgical History:  Procedure Laterality Date  . COLONOSCOPY    . POLYPECTOMY    . SHOULDER SURGERY  2009   right  .  TONSILLECTOMY    . TOTAL HIP ARTHROPLASTY Left 03/28/2017   Procedure: TOTAL HIP ARTHROPLASTY ANTERIOR APPROACH;  Surgeon: Frederik Pear, MD;  Location: [facility];  Service: Orthopedics;  Laterality: Left;  . TOTAL HIP ARTHROPLASTY Right 09/25/2018   Procedure: Right Anterior Hip Arthroplasty;  Surgeon: Frederik Pear, MD;  Location: [facility];  Service: Orthopedics;  Laterality: Right;  . TOTAL SHOULDER ARTHROPLASTY Left 10/14/2015   Procedure: LEFT TOTAL SHOULDER ARTHROPLASTY;  Surgeon: Marchia Bond, MD;  Location: [facility];  Service: Orthopedics;  Laterality: Left;  . TOTAL SHOULDER ARTHROPLASTY Right 05/25/2016   Procedure: TOTAL SHOULDER ARTHROPLASTY;  Surgeon:  Marchia Bond, MD;  Location: [facility];  Service: Orthopedics;  Laterality: Right;    SOCIAL HISTORY: Social History   Tobacco Use  . Smoking status: Never Smoker  . Smokeless tobacco: Never Used  Substance Use Topics  . Alcohol use: Yes    Alcohol/week: 0.0 standard drinks    Comment: rare----12 a year  . Drug use: No    FAMILY HISTORY: Family History  Problem Relation Age of Onset  . Arthritis Mother   . Cancer Mother 64       breast  . Hyperlipidemia Mother   . Colon polyps Mother 85  . Hypertension Mother   . Heart disease Mother   . Depression Mother   . Anxiety disorder Mother   . Obesity Mother   . Breast cancer Mother   . Heart disease Father        CAD--passed away 06-18-11 age 42  . Hyperlipidemia Father   . Hypertension Father   . Obesity Father   . Diabetes Father   . Depression Father   . Sleep apnea Father   . Cancer Sister 71       breast  . Breast cancer Sister   . Cancer Brother 61       prostate  . Prostate cancer Brother   . Breast cancer Other   . Prostate cancer Other   . Colon cancer Neg Hx   . Esophageal cancer Neg Hx   . Rectal cancer Neg Hx   . Stomach cancer Neg Hx     ROS: Review of Systems  Constitutional: Positive for weight loss.  Cardiovascular: Negative for chest pain.    PHYSICAL  EXAM: Pt in no acute distress  RECENT LABS AND TESTS: BMET    Component Value Date/Time   NA 137 09/26/2018 0326   NA 141 12/14/2017 1030   K 4.3 09/26/2018 0326   CL 107 09/26/2018 0326   CO2 26 09/26/2018 0326   GLUCOSE 142 (H) 09/26/2018 0326   BUN 13 09/26/2018 0326   BUN 19 12/14/2017 1030   CREATININE 0.82 09/26/2018 0326   CREATININE 0.93 04/23/2016 1633   CALCIUM 8.6 (L) 09/26/2018 0326   GFRNONAA >60 09/26/2018 0326   GFRAA >60 09/26/2018 0326   Lab Results  Component Value Date   HGBA1C 5.6 08/29/2018   HGBA1C 5.4 03/09/2018   HGBA1C 5.7 (H) 12/14/2017   HGBA1C 5.6 03/08/2017   HGBA1C 5.5 11/18/2016   Lab Results  Component Value Date   INSULIN 21.3 12/14/2017   CBC    Component Value Date/Time   WBC 12.8 (H) 09/26/2018 0326   RBC 4.38 09/26/2018 0326   HGB 13.4 09/26/2018 0326   HGB 15.5 12/14/2017 1030   HCT 41.8 09/26/2018 0326   HCT 45.1 12/14/2017 1030   PLT 168 09/26/2018 0326   MCV 95.4 09/26/2018 0326   MCV 90 12/14/2017 1030   MCH 30.6 09/26/2018 0326   MCHC 32.1 09/26/2018 0326   RDW 12.7 09/26/2018 0326   RDW 13.0 12/14/2017 1030   LYMPHSABS 1.4 09/21/2018 0817   LYMPHSABS 1.7 12/14/2017 1030   MONOABS 0.8 09/21/2018 0817   EOSABS 0.0 09/21/2018 0817   EOSABS 0.1 12/14/2017 1030   BASOSABS 0.0 09/21/2018 0817   BASOSABS 0.0 12/14/2017 1030   Iron/TIBC/Ferritin/ %Sat No results found for: IRON, TIBC, FERRITIN, IRONPCTSAT Lipid Panel     Component Value Date/Time   CHOL 120 08/29/2018 1017   CHOL 150 12/14/2017 1030   TRIG 50.0 08/29/2018 1017  HDL 39.70 08/29/2018 1017   HDL 52 12/14/2017 1030   CHOLHDL 3 08/29/2018 1017   VLDL 10.0 08/29/2018 1017   LDLCALC 70 08/29/2018 1017   LDLCALC 78 12/14/2017 1030   Hepatic Function Panel     Component Value Date/Time   PROT 6.0 08/29/2018 1017   PROT 6.5 12/14/2017 1030   ALBUMIN 4.1 08/29/2018 1017   ALBUMIN 4.5 12/14/2017 1030   AST 10 08/29/2018 1017   ALT 12 08/29/2018  1017   ALKPHOS 58 08/29/2018 1017   BILITOT 0.4 08/29/2018 1017   BILITOT 0.6 12/14/2017 1030   BILIDIR 0.2 06/07/2014 0909      Component Value Date/Time   TSH 1.060 12/14/2017 1030   TSH 0.62 10/03/2015 1230   TSH 0.51 10/08/2010 0859     Ref. Range 12/14/2017 10:30  Vitamin D, 25-Hydroxy Latest Ref Range: 30.0 - 100.0 ng/mL 23.2 (L)    I, Doreene Nest, am acting as transcriptionist for [name], MD I have reviewed the above documentation for accuracy and completeness, and I agree with the above. -[name], MD

## 2018-10-03 DIAGNOSIS — Z96641 Presence of right artificial hip joint: Secondary | ICD-10-CM | POA: Diagnosis not present

## 2018-10-03 DIAGNOSIS — R29898 Other symptoms and signs involving the musculoskeletal system: Secondary | ICD-10-CM | POA: Diagnosis not present

## 2018-10-03 DIAGNOSIS — M25551 Pain in right hip: Secondary | ICD-10-CM | POA: Diagnosis not present

## 2018-10-03 DIAGNOSIS — R2689 Other abnormalities of gait and mobility: Secondary | ICD-10-CM | POA: Diagnosis not present

## 2018-10-03 DIAGNOSIS — M2560 Stiffness of unspecified joint, not elsewhere classified: Secondary | ICD-10-CM | POA: Diagnosis not present

## 2018-10-03 DIAGNOSIS — Z7409 Other reduced mobility: Secondary | ICD-10-CM | POA: Diagnosis not present

## 2018-10-06 DIAGNOSIS — M25551 Pain in right hip: Secondary | ICD-10-CM | POA: Diagnosis not present

## 2018-10-06 DIAGNOSIS — M2560 Stiffness of unspecified joint, not elsewhere classified: Secondary | ICD-10-CM | POA: Diagnosis not present

## 2018-10-06 DIAGNOSIS — R2689 Other abnormalities of gait and mobility: Secondary | ICD-10-CM | POA: Diagnosis not present

## 2018-10-06 DIAGNOSIS — Z96641 Presence of right artificial hip joint: Secondary | ICD-10-CM | POA: Diagnosis not present

## 2018-10-06 DIAGNOSIS — Z7409 Other reduced mobility: Secondary | ICD-10-CM | POA: Diagnosis not present

## 2018-10-06 DIAGNOSIS — R29898 Other symptoms and signs involving the musculoskeletal system: Secondary | ICD-10-CM | POA: Diagnosis not present

## 2018-10-09 DIAGNOSIS — M25551 Pain in right hip: Secondary | ICD-10-CM | POA: Diagnosis not present

## 2018-10-09 DIAGNOSIS — M2560 Stiffness of unspecified joint, not elsewhere classified: Secondary | ICD-10-CM | POA: Diagnosis not present

## 2018-10-09 DIAGNOSIS — Z7409 Other reduced mobility: Secondary | ICD-10-CM | POA: Diagnosis not present

## 2018-10-09 DIAGNOSIS — Z96641 Presence of right artificial hip joint: Secondary | ICD-10-CM | POA: Diagnosis not present

## 2018-10-09 DIAGNOSIS — R2689 Other abnormalities of gait and mobility: Secondary | ICD-10-CM | POA: Diagnosis not present

## 2018-10-09 DIAGNOSIS — R29898 Other symptoms and signs involving the musculoskeletal system: Secondary | ICD-10-CM | POA: Diagnosis not present

## 2018-10-10 DIAGNOSIS — M1611 Unilateral primary osteoarthritis, right hip: Secondary | ICD-10-CM | POA: Diagnosis not present

## 2018-10-11 ENCOUNTER — Other Ambulatory Visit: Payer: Self-pay

## 2018-10-11 ENCOUNTER — Encounter (INDEPENDENT_AMBULATORY_CARE_PROVIDER_SITE_OTHER): Payer: Self-pay | Admitting: Family Medicine

## 2018-10-11 ENCOUNTER — Telehealth (INDEPENDENT_AMBULATORY_CARE_PROVIDER_SITE_OTHER): Payer: Medicare Other | Admitting: Family Medicine

## 2018-10-11 DIAGNOSIS — R2689 Other abnormalities of gait and mobility: Secondary | ICD-10-CM | POA: Diagnosis not present

## 2018-10-11 DIAGNOSIS — I10 Essential (primary) hypertension: Secondary | ICD-10-CM

## 2018-10-11 DIAGNOSIS — M2560 Stiffness of unspecified joint, not elsewhere classified: Secondary | ICD-10-CM | POA: Diagnosis not present

## 2018-10-11 DIAGNOSIS — R29898 Other symptoms and signs involving the musculoskeletal system: Secondary | ICD-10-CM | POA: Diagnosis not present

## 2018-10-11 DIAGNOSIS — Z7409 Other reduced mobility: Secondary | ICD-10-CM | POA: Diagnosis not present

## 2018-10-11 DIAGNOSIS — Z6837 Body mass index (BMI) 37.0-37.9, adult: Secondary | ICD-10-CM | POA: Diagnosis not present

## 2018-10-11 DIAGNOSIS — Z96641 Presence of right artificial hip joint: Secondary | ICD-10-CM | POA: Diagnosis not present

## 2018-10-11 DIAGNOSIS — I959 Hypotension, unspecified: Secondary | ICD-10-CM | POA: Insufficient documentation

## 2018-10-11 DIAGNOSIS — M25551 Pain in right hip: Secondary | ICD-10-CM | POA: Diagnosis not present

## 2018-10-12 NOTE — Progress Notes (Signed)
Office: [phone]  /  Fax: [phone] TeleHealth Visit:  Eric [name] has verbally consented to this TeleHealth visit today. The patient is located at home, the provider is located at the [facility] office. The participants in this visit include the listed provider and patient and any and all parties involved. The visit was conducted today via FaceTime.  HPI:   Chief Complaint: OBESITY Eric [name] is here to discuss his progress with his obesity treatment plan. He is on the Category 3 plan and is following his eating plan approximately 90 % of the time. He states he is walking 1/2 mile 7 days per week. Eric [name] is currently doing physical therapy for his right hip replacement, and he feels he is doing well with his level of activity. Hunger is controlled on his Category 3 plan. Eric [name] is not bored with his food choices and he is doing well increasing lean protein. We were unable to weigh the patient today for this TeleHealth visit. He feels as if he has maintained weight since his last visit. He has lost 41 lbs since starting treatment with [place].  Hypertension Eric [name] is a 73 y.o. male with hypertension. He states his blood pressure is controlled, and his blood pressure reading was 114/63 this morning. Eric [name] denies chest pain, headache or dizziness. Eric [name] is doing well with his diet and he is slowly increasing walking for exercise. He is working weight loss to help control his blood pressure with the goal of decreasing his risk of heart attack and stroke. Eric [name] blood pressure is currently controlled.  ASSESSMENT AND PLAN:  Essential hypertension  Class 2 severe obesity with serious comorbidity and body mass index (BMI) of 37.0 to 37.9 in adult, unspecified obesity type ([place])  PLAN:  Hypertension We discussed sodium restriction, working on healthy weight loss, and a regular exercise program as the means to achieve improved blood pressure  control. Jarreau agreed with this plan and agreed to follow up as directed. We will continue to monitor his blood pressure as well as his progress with the above lifestyle modifications. He will continue his medications as prescribed and will watch for signs of hypotension as he continues his lifestyle modifications.  I spent > than 50% of the 15 minute visit on counseling as documented in the note.  Obesity Eric [name] is currently in the action stage of change. As such, his goal is to continue with weight loss efforts He has agreed to follow the Category 3 plan Eric [name] has been instructed to work up to a goal of 150 minutes of combined cardio and strengthening exercise per week for weight loss and overall health benefits. We discussed the following Behavioral Modification Strategies today: increasing lean protein intake, work on meal planning and easy cooking plans and celebration eating strategies   Eric [name] has agreed to follow up with our clinic in 2 weeks. He was informed of the importance of frequent follow up visits to maximize his success with intensive lifestyle modifications for his multiple health conditions.  ALLERGIES: Allergies  Allergen Reactions  . Lisinopril Cough    MEDICATIONS: Current Outpatient Medications on File Prior to Visit  Medication Sig Dispense Refill  . aspirin EC 81 MG tablet Take 1 tablet (81 mg total) by mouth 2 (two) times daily. (Patient taking differently: Take 81 mg by mouth once. ) 60 tablet 0  . atorvastatin (LIPITOR) 40 MG tablet TAKE 1 TABLET DAILY (Patient taking differently: Take 40 mg by mouth daily. )  90 tablet 4  . carbamide peroxide (DEBROX) 6.5 % OTIC solution Place 5 drops into both ears daily as needed (for ear wax).    . Coenzyme Q10 (COQ10) 200 MG CAPS Take 200 mg by mouth at bedtime.    . fluocinonide ointment (LIDEX) 9.24 % Apply 1 application topically daily as needed (for bites). 30 g 3  . fluticasone (FLONASE) 50 MCG/ACT nasal spray  Place 2 sprays into both nostrils daily as needed for allergies.     [name] loratadine (CLARITIN) 10 MG tablet Take 10 mg by mouth daily as needed for allergies.     . metFORMIN (GLUCOPHAGE) 500 MG tablet Take 1 tablet (500 mg total) by mouth daily with breakfast. 30 tablet 0  . pantoprazole (PROTONIX) 40 MG tablet Take 1 tablet (40 mg total) by mouth daily. 90 tablet 3  . tamsulosin (FLOMAX) 0.4 MG CAPS capsule TAKE 1 CAPSULE DAILY (Patient taking differently: Take 0.4 mg by mouth daily. ) 90 capsule 3  . valsartan (DIOVAN) 80 MG tablet TAKE 1 TABLET DAILY (Patient taking differently: Take 80 mg by mouth daily. ) 90 tablet 3  . Vitamin D, Ergocalciferol, (DRISDOL) 1.25 MG (50000 UT) CAPS capsule Take 1 capsule (50,000 Units total) by mouth every 7 (seven) days. 4 capsule 0   No current facility-administered medications on file prior to visit.     PAST MEDICAL HISTORY: Past Medical History:  Diagnosis Date  . Alcohol abuse   . Allergy   . Back pain   . Cataract    forming  . Colon polyps   . Diverticulosis of colon   . DJD (degenerative joint disease)   . Dysrhythmia    Palpations from caffiene- reports he was having PACs due to slim fast wth caffeine - had full cardiac w/u and was told to cut back on caffeine;  now drink decaf bevrerages and no rpeort of recurrence   . Enlarged prostate   . GERD (gastroesophageal reflux disease)   . Hearing loss   . Hip osteoarthritis    Left  . History of hay fever   . Hx of cardiovascular stress test    Lexiscan Myoview (04/2013):  No ischemia, EF 61%; low risk  . Hyperlipemia   . Hypertension   . Insulin resistance    om metfromin   . Internal hemorrhoids without mention of complication   . Leg edema   . Neuroma of foot    left  . Obesity   . Osteoarthrosis, unspecified whether generalized or localized, unspecified site   . Palpitations   . 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 Primary localized osteoarthrosis of right  shoulder 05/25/2016  . Recent weight loss    75lbs  . Seasonal allergies   . Sleep apnea    CPAP  . Thyroid nodule    calcified thyroid nodules- bx negative   . Tremor of both hands     PAST SURGICAL HISTORY: Past Surgical History:  Procedure Laterality Date  . COLONOSCOPY    . POLYPECTOMY    . SHOULDER SURGERY  2009   right  . TONSILLECTOMY    . TOTAL HIP ARTHROPLASTY Left 03/28/2017   Procedure: TOTAL HIP ARTHROPLASTY ANTERIOR APPROACH;  Surgeon: Frederik Pear, MD;  Location: [facility];  Service: Orthopedics;  Laterality: Left;  . TOTAL HIP ARTHROPLASTY Right 09/25/2018   Procedure: Right Anterior Hip Arthroplasty;  Surgeon: Frederik Pear, MD;  Location: [facility];  Service: Orthopedics;  Laterality: Right;  . TOTAL SHOULDER  ARTHROPLASTY Left 10/14/2015   Procedure: LEFT TOTAL SHOULDER ARTHROPLASTY;  Surgeon: Marchia Bond, MD;  Location: [facility];  Service: Orthopedics;  Laterality: Left;  . TOTAL SHOULDER ARTHROPLASTY Right 05/25/2016   Procedure: TOTAL SHOULDER ARTHROPLASTY;  Surgeon: Marchia Bond, MD;  Location: [facility];  Service: Orthopedics;  Laterality: Right;    SOCIAL HISTORY: Social History   Tobacco Use  . Smoking status: Never Smoker  . Smokeless tobacco: Never Used  Substance Use Topics  . Alcohol use: Yes    Alcohol/week: 0.0 standard drinks    Comment: rare----12 a year  . Drug use: No    FAMILY HISTORY: Family History  Problem Relation Age of Onset  . Arthritis Mother   . Cancer Mother 38       breast  . Hyperlipidemia Mother   . Colon polyps Mother 49  . Hypertension Mother   . Heart disease Mother   . Depression Mother   . Anxiety disorder Mother   . Obesity Mother   . Breast cancer Mother   . Heart disease Father        CAD--passed away 2011-06-23 age 53  . Hyperlipidemia Father   . Hypertension Father   . Obesity Father   . Diabetes Father   . Depression Father   . Sleep apnea Father   . Cancer Sister 46       breast  . Breast cancer Sister   . Cancer  Brother 42       prostate  . Prostate cancer Brother   . Breast cancer Other   . Prostate cancer Other   . Colon cancer Neg Hx   . Esophageal cancer Neg Hx   . Rectal cancer Neg Hx   . Stomach cancer Neg Hx     ROS: Review of Systems  Constitutional: Negative for weight loss.  Cardiovascular: Negative for chest pain.  Neurological: Negative for dizziness and headaches.    PHYSICAL EXAM: Pt in no acute distress  RECENT LABS AND TESTS: BMET    Component Value Date/Time   NA 137 09/26/2018 0326   NA 141 12/14/2017 1030   K 4.3 09/26/2018 0326   CL 107 09/26/2018 0326   CO2 26 09/26/2018 0326   GLUCOSE 142 (H) 09/26/2018 0326   BUN 13 09/26/2018 0326   BUN 19 12/14/2017 1030   CREATININE 0.82 09/26/2018 0326   CREATININE 0.93 04/23/2016 1633   CALCIUM 8.6 (L) 09/26/2018 0326   GFRNONAA >60 09/26/2018 0326   GFRAA >60 09/26/2018 0326   Lab Results  Component Value Date   HGBA1C 5.6 08/29/2018   HGBA1C 5.4 03/09/2018   HGBA1C 5.7 (H) 12/14/2017   HGBA1C 5.6 03/08/2017   HGBA1C 5.5 11/18/2016   Lab Results  Component Value Date   INSULIN 21.3 12/14/2017   CBC    Component Value Date/Time   WBC 12.8 (H) 09/26/2018 0326   RBC 4.38 09/26/2018 0326   HGB 13.4 09/26/2018 0326   HGB 15.5 12/14/2017 1030   HCT 41.8 09/26/2018 0326   HCT 45.1 12/14/2017 1030   PLT 168 09/26/2018 0326   MCV 95.4 09/26/2018 0326   MCV 90 12/14/2017 1030   MCH 30.6 09/26/2018 0326   MCHC 32.1 09/26/2018 0326   RDW 12.7 09/26/2018 0326   RDW 13.0 12/14/2017 1030   LYMPHSABS 1.4 09/21/2018 0817   LYMPHSABS 1.7 12/14/2017 1030   MONOABS 0.8 09/21/2018 0817   EOSABS 0.0 09/21/2018 0817   EOSABS 0.1 12/14/2017 1030   BASOSABS 0.0  09/21/2018 0817   BASOSABS 0.0 12/14/2017 1030   Iron/TIBC/Ferritin/ %Sat No results found for: IRON, TIBC, FERRITIN, IRONPCTSAT Lipid Panel     Component Value Date/Time   CHOL 120 08/29/2018 1017   CHOL 150 12/14/2017 1030   TRIG 50.0 08/29/2018  1017   HDL 39.70 08/29/2018 1017   HDL 52 12/14/2017 1030   CHOLHDL 3 08/29/2018 1017   VLDL 10.0 08/29/2018 1017   LDLCALC 70 08/29/2018 1017   LDLCALC 78 12/14/2017 1030   Hepatic Function Panel     Component Value Date/Time   PROT 6.0 08/29/2018 1017   PROT 6.5 12/14/2017 1030   ALBUMIN 4.1 08/29/2018 1017   ALBUMIN 4.5 12/14/2017 1030   AST 10 08/29/2018 1017   ALT 12 08/29/2018 1017   ALKPHOS 58 08/29/2018 1017   BILITOT 0.4 08/29/2018 1017   BILITOT 0.6 12/14/2017 1030   BILIDIR 0.2 06/07/2014 0909      Component Value Date/Time   TSH 1.060 12/14/2017 1030   TSH 0.62 10/03/2015 1230   TSH 0.51 10/08/2010 0859     Ref. Range 08/29/2018 10:17  VITD Latest Ref Range: 30.00 - 100.00 ng/mL 49.95    I, Doreene Nest, am acting as Location manager for [name], MD I have reviewed the above documentation for accuracy and completeness, and I agree with the above. -[name], MD

## 2018-10-16 DIAGNOSIS — Z96641 Presence of right artificial hip joint: Secondary | ICD-10-CM | POA: Diagnosis not present

## 2018-10-16 DIAGNOSIS — M2560 Stiffness of unspecified joint, not elsewhere classified: Secondary | ICD-10-CM | POA: Diagnosis not present

## 2018-10-16 DIAGNOSIS — Z7409 Other reduced mobility: Secondary | ICD-10-CM | POA: Diagnosis not present

## 2018-10-16 DIAGNOSIS — R2689 Other abnormalities of gait and mobility: Secondary | ICD-10-CM | POA: Diagnosis not present

## 2018-10-16 DIAGNOSIS — R29898 Other symptoms and signs involving the musculoskeletal system: Secondary | ICD-10-CM | POA: Diagnosis not present

## 2018-10-16 DIAGNOSIS — M25551 Pain in right hip: Secondary | ICD-10-CM | POA: Diagnosis not present

## 2018-10-18 DIAGNOSIS — Z7409 Other reduced mobility: Secondary | ICD-10-CM | POA: Diagnosis not present

## 2018-10-18 DIAGNOSIS — M2560 Stiffness of unspecified joint, not elsewhere classified: Secondary | ICD-10-CM | POA: Diagnosis not present

## 2018-10-18 DIAGNOSIS — Z96641 Presence of right artificial hip joint: Secondary | ICD-10-CM | POA: Diagnosis not present

## 2018-10-18 DIAGNOSIS — M25551 Pain in right hip: Secondary | ICD-10-CM | POA: Diagnosis not present

## 2018-10-18 DIAGNOSIS — R29898 Other symptoms and signs involving the musculoskeletal system: Secondary | ICD-10-CM | POA: Diagnosis not present

## 2018-10-18 DIAGNOSIS — R2689 Other abnormalities of gait and mobility: Secondary | ICD-10-CM | POA: Diagnosis not present

## 2018-10-23 DIAGNOSIS — M25551 Pain in right hip: Secondary | ICD-10-CM | POA: Diagnosis not present

## 2018-10-23 DIAGNOSIS — Z7409 Other reduced mobility: Secondary | ICD-10-CM | POA: Diagnosis not present

## 2018-10-23 DIAGNOSIS — Z96641 Presence of right artificial hip joint: Secondary | ICD-10-CM | POA: Diagnosis not present

## 2018-10-23 DIAGNOSIS — M2560 Stiffness of unspecified joint, not elsewhere classified: Secondary | ICD-10-CM | POA: Diagnosis not present

## 2018-10-23 DIAGNOSIS — Z789 Other specified health status: Secondary | ICD-10-CM | POA: Diagnosis not present

## 2018-10-23 DIAGNOSIS — R29898 Other symptoms and signs involving the musculoskeletal system: Secondary | ICD-10-CM | POA: Diagnosis not present

## 2018-10-23 DIAGNOSIS — Z7389 Other problems related to life management difficulty: Secondary | ICD-10-CM | POA: Diagnosis not present

## 2018-10-23 DIAGNOSIS — R2689 Other abnormalities of gait and mobility: Secondary | ICD-10-CM | POA: Diagnosis not present

## 2018-10-25 DIAGNOSIS — Z7409 Other reduced mobility: Secondary | ICD-10-CM | POA: Diagnosis not present

## 2018-10-25 DIAGNOSIS — Z96641 Presence of right artificial hip joint: Secondary | ICD-10-CM | POA: Diagnosis not present

## 2018-10-25 DIAGNOSIS — R29898 Other symptoms and signs involving the musculoskeletal system: Secondary | ICD-10-CM | POA: Diagnosis not present

## 2018-10-25 DIAGNOSIS — R2689 Other abnormalities of gait and mobility: Secondary | ICD-10-CM | POA: Diagnosis not present

## 2018-10-25 DIAGNOSIS — M25551 Pain in right hip: Secondary | ICD-10-CM | POA: Diagnosis not present

## 2018-10-25 DIAGNOSIS — M2560 Stiffness of unspecified joint, not elsewhere classified: Secondary | ICD-10-CM | POA: Diagnosis not present

## 2018-10-26 ENCOUNTER — Telehealth (INDEPENDENT_AMBULATORY_CARE_PROVIDER_SITE_OTHER): Payer: Medicare Other | Admitting: Family Medicine

## 2018-10-26 ENCOUNTER — Other Ambulatory Visit: Payer: Self-pay

## 2018-10-26 ENCOUNTER — Encounter (INDEPENDENT_AMBULATORY_CARE_PROVIDER_SITE_OTHER): Payer: Self-pay | Admitting: Family Medicine

## 2018-10-26 DIAGNOSIS — R7303 Prediabetes: Secondary | ICD-10-CM | POA: Diagnosis not present

## 2018-10-26 DIAGNOSIS — Z6837 Body mass index (BMI) 37.0-37.9, adult: Secondary | ICD-10-CM

## 2018-10-26 DIAGNOSIS — E559 Vitamin D deficiency, unspecified: Secondary | ICD-10-CM

## 2018-10-26 MED ORDER — VITAMIN D (ERGOCALCIFEROL) 1.25 MG (50000 UNIT) PO CAPS: 50000.0000 [IU] | ORAL_CAPSULE | ORAL | 0 refills | Status: DC

## 2018-10-26 MED ORDER — METFORMIN HCL 500 MG PO TABS: 500.0000 mg | ORAL_TABLET | Freq: Every day | ORAL | 0 refills | Status: DC

## 2018-10-30 ENCOUNTER — Other Ambulatory Visit (INDEPENDENT_AMBULATORY_CARE_PROVIDER_SITE_OTHER): Payer: Self-pay

## 2018-10-30 DIAGNOSIS — R7303 Prediabetes: Secondary | ICD-10-CM

## 2018-10-30 DIAGNOSIS — E559 Vitamin D deficiency, unspecified: Secondary | ICD-10-CM

## 2018-10-30 MED ORDER — VITAMIN D (ERGOCALCIFEROL) 1.25 MG (50000 UNIT) PO CAPS: 50000.0000 [IU] | ORAL_CAPSULE | ORAL | 0 refills | Status: DC

## 2018-10-30 MED ORDER — METFORMIN HCL 500 MG PO TABS: 500.0000 mg | ORAL_TABLET | Freq: Every day | ORAL | 0 refills | Status: DC

## 2018-10-30 NOTE — Progress Notes (Signed)
Office: [phone]  /  Fax: [phone] TeleHealth Visit:  Eric [name] has verbally consented to this TeleHealth visit today. The patient is located at home, the provider is located at the [facility] office. The participants in this visit include the listed provider and patient. The visit was conducted today via FaceTime.  HPI:   Chief Complaint: OBESITY Eric [name] is here to discuss his progress with his obesity treatment plan. He is on the Category 3 plan and is following his eating plan approximately 50% of the time. He states he is doing physical therapy 45 minutes 2 times per week, walking 1 mile for 30 minutes 3 times per week, and doing yard work. Eric [name] has had increased celebration eating since his last visit and thinks he has gained 1 lb. He is 6 weeks postop hip replacement and doing PT 3-4 times per week and walking 1 mile per day. He states he is ready to get back on track with his diet. We were unable to weigh the patient today for this TeleHealth visit. He feels as if he has gained 1 lb since his last visit. He has lost 41 lbs since starting treatment with [place].  Pre-Diabetes Eric [name] has a diagnosis of prediabetes based on his elevated Hgb A1c and was informed this puts him at greater risk of developing diaetes. He is stable on metformin currenbtly and continues to work on diet, exercise, and weight loss to decrease risk of diabetes. He denies nausea, vomiting, or hypoglycemia. He is due for labs soon.  Vitamin D deficiency Eric [name] has a diagnosis of Vitamin D deficiency. He is currently stable on prescription Vit D and denies nausea, vomiting or muscle weakness. He is due for labs soon.  ASSESSMENT AND PLAN:  Class 2 severe obesity with serious comorbidity and body mass index (BMI) of 37.0 to 37.9 in adult, unspecified obesity type ([facility])  Vitamin D deficiency - Plan: DISCONTINUED: Vitamin D, Ergocalciferol, (DRISDOL) 1.25 MG (50000 UT) CAPS capsule   Prediabetes - Plan: DISCONTINUED: metFORMIN (GLUCOPHAGE) 500 MG tablet  PLAN:  Pre-Diabetes Eric [name] will continue to work on weight loss, exercise, and decreasing simple carbohydrates in his diet to help decrease the risk of diabetes. We dicussed metformin including benefits and risks. He was informed that eating too many simple carbohydrates or too many calories at one sitting increases the likelihood of GI side effects. Thurl was given a refill on his metformin 500 mg #30 with 0 refills and he agrees to follow-up with our clinic in 2-3 weeks. He will have labs checked at his next visit.  Vitamin D Deficiency Eric [name] was informed that low Vitamin D levels contributes to fatigue and are associated with obesity, breast, and colon cancer. He agrees to continue to take prescription Vit D @ 50,000 IU every week #4 with 0 refills and will follow-up for routine testing of Vitamin D at his next visit. He was informed of the risk of over-replacement of Vitamin D and agrees to not increase his dose unless he discusses this with [place] first. Richad agrees to follow-up with our clinic in 2-3 weeks.  Obesity Eric [name] is currently in the action stage of change. As such, his goal is to continue with weight loss efforts. He has agreed to follow the Category 3 plan. Eric [name] has been instructed to work up to a goal of 150 minutes of combined cardio and strengthening exercise per week for weight loss and overall health benefits. We discussed the following Behavioral Modification Strategies  today: increasing lean protein intake and celebration eating strategies.  Eric [name] has agreed to follow-up with our clinic in 2-3 weeks. He was informed of the importance of frequent follow-up visits to maximize his success with intensive lifestyle modifications for his multiple health conditions.  ALLERGIES: Allergies  Allergen Reactions  . Lisinopril Cough    MEDICATIONS: Current Outpatient Medications on File Prior to  Visit  Medication Sig Dispense Refill  . aspirin EC 81 MG tablet Take 1 tablet (81 mg total) by mouth 2 (two) times daily. (Patient taking differently: Take 81 mg by mouth once. ) 60 tablet 0  . atorvastatin (LIPITOR) 40 MG tablet TAKE 1 TABLET DAILY (Patient taking differently: Take 40 mg by mouth daily. ) 90 tablet 4  . carbamide peroxide (DEBROX) 6.5 % OTIC solution Place 5 drops into both ears daily as needed (for ear wax).    . Coenzyme Q10 (COQ10) 200 MG CAPS Take 200 mg by mouth at bedtime.    . fluocinonide ointment (LIDEX) 0.96 % Apply 1 application topically daily as needed (for bites). 30 g 3  . fluticasone (FLONASE) 50 MCG/ACT nasal spray Place 2 sprays into both nostrils daily as needed for allergies.     [name] loratadine (CLARITIN) 10 MG tablet Take 10 mg by mouth daily as needed for allergies.     . pantoprazole (PROTONIX) 40 MG tablet Take 1 tablet (40 mg total) by mouth daily. 90 tablet 3  . tamsulosin (FLOMAX) 0.4 MG CAPS capsule TAKE 1 CAPSULE DAILY (Patient taking differently: Take 0.4 mg by mouth daily. ) 90 capsule 3  . valsartan (DIOVAN) 80 MG tablet TAKE 1 TABLET DAILY (Patient taking differently: Take 80 mg by mouth daily. ) 90 tablet 3   No current facility-administered medications on file prior to visit.     PAST MEDICAL HISTORY: Past Medical History:  Diagnosis Date  . Alcohol abuse   . Allergy   . Back pain   . Cataract    forming  . Colon polyps   . Diverticulosis of colon   . DJD (degenerative joint disease)   . Dysrhythmia    Palpations from caffiene- reports he was having PACs due to slim fast wth caffeine - had full cardiac w/u and was told to cut back on caffeine;  now drink decaf bevrerages and no rpeort of recurrence   . Enlarged prostate   . GERD (gastroesophageal reflux disease)   . Hearing loss   . Hip osteoarthritis    Left  . History of hay fever   . Hx of cardiovascular stress test    Lexiscan Myoview (04/2013):  No ischemia, EF 61%; low risk   . Hyperlipemia   . Hypertension   . Insulin resistance    om metfromin   . Internal hemorrhoids without mention of complication   . Leg edema   . Neuroma of foot    left  . Obesity   . Osteoarthrosis, unspecified whether generalized or localized, unspecified site   . Palpitations   . 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 Primary localized osteoarthrosis of right shoulder 05/25/2016  . Recent weight loss    75lbs  . Seasonal allergies   . Sleep apnea    CPAP  . Thyroid nodule    calcified thyroid nodules- bx negative   . Tremor of both hands     PAST SURGICAL HISTORY: Past Surgical History:  Procedure Laterality Date  . COLONOSCOPY    . POLYPECTOMY    .  SHOULDER SURGERY  07-12-2007   right  . TONSILLECTOMY    . TOTAL HIP ARTHROPLASTY Left 03/28/2017   Procedure: TOTAL HIP ARTHROPLASTY ANTERIOR APPROACH;  Surgeon: Frederik Pear, MD;  Location: [facility];  Service: Orthopedics;  Laterality: Left;  . TOTAL HIP ARTHROPLASTY Right 09/25/2018   Procedure: Right Anterior Hip Arthroplasty;  Surgeon: Frederik Pear, MD;  Location: [facility];  Service: Orthopedics;  Laterality: Right;  . TOTAL SHOULDER ARTHROPLASTY Left 10/14/2015   Procedure: LEFT TOTAL SHOULDER ARTHROPLASTY;  Surgeon: Marchia Bond, MD;  Location: [place];  Service: Orthopedics;  Laterality: Left;  . TOTAL SHOULDER ARTHROPLASTY Right 05/25/2016   Procedure: TOTAL SHOULDER ARTHROPLASTY;  Surgeon: Marchia Bond, MD;  Location: [facility];  Service: Orthopedics;  Laterality: Right;    SOCIAL HISTORY: Social History   Tobacco Use  . Smoking status: Never Smoker  . Smokeless tobacco: Never Used  Substance Use Topics  . Alcohol use: Yes    Alcohol/week: 0.0 standard drinks    Comment: rare----12 a year  . Drug use: No    FAMILY HISTORY: Family History  Problem Relation Age of Onset  . Arthritis Mother   . Cancer Mother 54       breast  . Hyperlipidemia Mother   . Colon polyps Mother 19  . Hypertension Mother   .  Heart disease Mother   . Depression Mother   . Anxiety disorder Mother   . Obesity Mother   . Breast cancer Mother   . Heart disease Father        CAD--passed away 07/12/11 age 45  . Hyperlipidemia Father   . Hypertension Father   . Obesity Father   . Diabetes Father   . Depression Father   . Sleep apnea Father   . Cancer Sister 55       breast  . Breast cancer Sister   . Cancer Brother 72       prostate  . Prostate cancer Brother   . Breast cancer Other   . Prostate cancer Other   . Colon cancer Neg Hx   . Esophageal cancer Neg Hx   . Rectal cancer Neg Hx   . Stomach cancer Neg Hx    ROS: Review of Systems  Gastrointestinal: Negative for nausea and vomiting.  Musculoskeletal:       Negative for muscle weakness.  Endo/Heme/Allergies:       Negative for hypoglycemia.   PHYSICAL EXAM: Pt in no acute distress  RECENT LABS AND TESTS: BMET    Component Value Date/Time   NA 137 09/26/2018 0326   NA 141 12/14/2017 1030   K 4.3 09/26/2018 0326   CL 107 09/26/2018 0326   CO2 26 09/26/2018 0326   GLUCOSE 142 (H) 09/26/2018 0326   BUN 13 09/26/2018 0326   BUN 19 12/14/2017 1030   CREATININE 0.82 09/26/2018 0326   CREATININE 0.93 04/23/2016 1633   CALCIUM 8.6 (L) 09/26/2018 0326   GFRNONAA >60 09/26/2018 0326   GFRAA >60 09/26/2018 0326   Lab Results  Component Value Date   HGBA1C 5.6 08/29/2018   HGBA1C 5.4 03/09/2018   HGBA1C 5.7 (H) 12/14/2017   HGBA1C 5.6 03/08/2017   HGBA1C 5.5 11/18/2016   Lab Results  Component Value Date   INSULIN 21.3 12/14/2017   CBC    Component Value Date/Time   WBC 12.8 (H) 09/26/2018 0326   RBC 4.38 09/26/2018 0326   HGB 13.4 09/26/2018 0326   HGB 15.5 12/14/2017 1030   HCT 41.8  09/26/2018 0326   HCT 45.1 12/14/2017 1030   PLT 168 09/26/2018 0326   MCV 95.4 09/26/2018 0326   MCV 90 12/14/2017 1030   MCH 30.6 09/26/2018 0326   MCHC 32.1 09/26/2018 0326   RDW 12.7 09/26/2018 0326   RDW 13.0 12/14/2017 1030   LYMPHSABS 1.4  09/21/2018 0817   LYMPHSABS 1.7 12/14/2017 1030   MONOABS 0.8 09/21/2018 0817   EOSABS 0.0 09/21/2018 0817   EOSABS 0.1 12/14/2017 1030   BASOSABS 0.0 09/21/2018 0817   BASOSABS 0.0 12/14/2017 1030   Iron/TIBC/Ferritin/ %Sat No results found for: IRON, TIBC, FERRITIN, IRONPCTSAT Lipid Panel     Component Value Date/Time   CHOL 120 08/29/2018 1017   CHOL 150 12/14/2017 1030   TRIG 50.0 08/29/2018 1017   HDL 39.70 08/29/2018 1017   HDL 52 12/14/2017 1030   CHOLHDL 3 08/29/2018 1017   VLDL 10.0 08/29/2018 1017   LDLCALC 70 08/29/2018 1017   LDLCALC 78 12/14/2017 1030   Hepatic Function Panel     Component Value Date/Time   PROT 6.0 08/29/2018 1017   PROT 6.5 12/14/2017 1030   ALBUMIN 4.1 08/29/2018 1017   ALBUMIN 4.5 12/14/2017 1030   AST 10 08/29/2018 1017   ALT 12 08/29/2018 1017   ALKPHOS 58 08/29/2018 1017   BILITOT 0.4 08/29/2018 1017   BILITOT 0.6 12/14/2017 1030   BILIDIR 0.2 06/07/2014 0909      Component Value Date/Time   TSH 1.060 12/14/2017 1030   TSH 0.62 10/03/2015 1230   TSH 0.51 10/08/2010 0859   Results for Tillman, Morrill L "MIKE" (MRN [id number]) as of 10/30/2018 16:52  Ref. Range 12/14/2017 10:30  Vitamin D, 25-Hydroxy Latest Ref Range: 30.0 - 100.0 ng/mL 23.2 (L)   I, Michaelene Song, am acting as Location manager for [name], MD I have reviewed the above documentation for accuracy and completeness, and I agree with the above. -[name], MD

## 2018-10-31 DIAGNOSIS — M2560 Stiffness of unspecified joint, not elsewhere classified: Secondary | ICD-10-CM | POA: Diagnosis not present

## 2018-10-31 DIAGNOSIS — M25551 Pain in right hip: Secondary | ICD-10-CM | POA: Diagnosis not present

## 2018-10-31 DIAGNOSIS — R2689 Other abnormalities of gait and mobility: Secondary | ICD-10-CM | POA: Diagnosis not present

## 2018-10-31 DIAGNOSIS — R29898 Other symptoms and signs involving the musculoskeletal system: Secondary | ICD-10-CM | POA: Diagnosis not present

## 2018-10-31 DIAGNOSIS — Z96641 Presence of right artificial hip joint: Secondary | ICD-10-CM | POA: Diagnosis not present

## 2018-10-31 DIAGNOSIS — Z7409 Other reduced mobility: Secondary | ICD-10-CM | POA: Diagnosis not present

## 2018-11-03 DIAGNOSIS — Z7409 Other reduced mobility: Secondary | ICD-10-CM | POA: Diagnosis not present

## 2018-11-03 DIAGNOSIS — M25551 Pain in right hip: Secondary | ICD-10-CM | POA: Diagnosis not present

## 2018-11-03 DIAGNOSIS — M2560 Stiffness of unspecified joint, not elsewhere classified: Secondary | ICD-10-CM | POA: Diagnosis not present

## 2018-11-03 DIAGNOSIS — R2689 Other abnormalities of gait and mobility: Secondary | ICD-10-CM | POA: Diagnosis not present

## 2018-11-03 DIAGNOSIS — R29898 Other symptoms and signs involving the musculoskeletal system: Secondary | ICD-10-CM | POA: Diagnosis not present

## 2018-11-03 DIAGNOSIS — Z96641 Presence of right artificial hip joint: Secondary | ICD-10-CM | POA: Diagnosis not present

## 2018-11-13 ENCOUNTER — Encounter (INDEPENDENT_AMBULATORY_CARE_PROVIDER_SITE_OTHER): Payer: Self-pay | Admitting: Family Medicine

## 2018-11-13 ENCOUNTER — Ambulatory Visit (INDEPENDENT_AMBULATORY_CARE_PROVIDER_SITE_OTHER): Payer: Medicare Other | Admitting: Family Medicine

## 2018-11-13 ENCOUNTER — Other Ambulatory Visit: Payer: Self-pay

## 2018-11-13 VITALS — BP 124/78 | HR 46 | Temp 97.7°F | Ht 68.0 in | Wt 235.0 lb

## 2018-11-13 DIAGNOSIS — Z6835 Body mass index (BMI) 35.0-35.9, adult: Secondary | ICD-10-CM

## 2018-11-13 DIAGNOSIS — E559 Vitamin D deficiency, unspecified: Secondary | ICD-10-CM

## 2018-11-13 DIAGNOSIS — R7303 Prediabetes: Secondary | ICD-10-CM

## 2018-11-13 MED ORDER — VITAMIN D (ERGOCALCIFEROL) 1.25 MG (50000 UNIT) PO CAPS: 50000.0000 [IU] | ORAL_CAPSULE | ORAL | 0 refills | Status: DC

## 2018-11-13 MED ORDER — METFORMIN HCL 500 MG PO TABS: 500.0000 mg | ORAL_TABLET | Freq: Every day | ORAL | 0 refills | Status: DC

## 2018-11-14 NOTE — Progress Notes (Signed)
Office: [phone]  /  Fax: [phone]   HPI:   Chief Complaint: OBESITY Eric [name] is here to discuss his progress with his obesity treatment plan. He is on Eric Category 3 plan and is following his eating plan approximately 85% of Eric time. He states he is walking 30 minutes 3 times per week. Eric [name] has done well with weight loss on his Category 3 plan and has lost 12 lbs over Eric last 5 months since his last in-office visit. He notes hunger is controlled and he has increased walking.  His weight is 235 lb (106.6 kg) today and has had a weight loss of 12 pounds over a period of 5 months since his last in-office visit. He has lost 53 lbs since starting treatment with [place].  Vitamin D deficiency Eric [name] has a diagnosis of Vitamin D deficiency. He is currently taking prescription Vit D and denies nausea, vomiting or muscle weakness. He is due for labs.  Pre-Diabetes Eric [name] has a diagnosis of prediabetes based on his elevated Hgb A1c and was informed this puts him at greater risk of developing diabetes. He is stable on metformin and diet and is doing well with weight loss. He continues to work on diet and exercise to decrease risk of diabetes. He denies nausea, vomiting, or hypoglycemia.  ASSESSMENT AND PLAN:  Vitamin D deficiency - Plan: Vitamin D 1,25 dihydroxy, Vitamin D, Ergocalciferol, (DRISDOL) 1.25 MG (50000 UT) CAPS capsule  Prediabetes - Plan: metFORMIN (GLUCOPHAGE) 500 MG tablet  Class 2 severe obesity with serious comorbidity and body mass index (BMI) of 35.0 to 35.9 in adult, unspecified obesity type (Eric [name])  PLAN:  Vitamin D Deficiency Eric [name] was informed that low Vitamin D levels contributes to fatigue and are associated with obesity, breast, and colon cancer. He agrees to continue to take prescription Vit D @ 50,000 IU every week #4 with 0 refills and will have routine testing of Vitamin D. He was informed of Eric risk of over-replacement of Vitamin D and agrees to not  increase his dose unless he discusses this with [place] first. Eric [name] agrees to follow-up with our clinic in 3 weeks.  Pre-Diabetes Eric [name] will continue to work on weight loss, exercise, and decreasing simple carbohydrates in his diet to help decrease Eric risk of diabetes. We dicussed metformin including benefits and risks. He was informed that eating too many simple carbohydrates or too many calories at one sitting increases Eric likelihood of GI side effects. Eric [name] was given a refill on his metformin 500 mg #30 with 0 refills and he agrees to follow-up with our clinic in 3 weeks.  Obesity Eric [name] is currently in Eric action stage of change. As such, his goal is to continue with weight loss efforts. He has agreed to follow Eric Category 3 plan. Eric [name] has been instructed to work up to a goal of 150 minutes of combined cardio and strengthening exercise per week for weight loss and overall health benefits. We discussed Eric following Behavioral Modification Strategies today: increasing lean protein intake and decreasing simple carbohydrates.   Eric [name] has agreed to follow-up with our clinic in 3 weeks. He was informed of Eric importance of frequent follow-up visits to maximize his success with intensive lifestyle modifications for his multiple health conditions.  ALLERGIES: Allergies  Allergen Reactions  . Lisinopril Cough    MEDICATIONS: Current Outpatient Medications on File Prior to Visit  Medication Sig Dispense Refill  . aspirin EC 81 MG tablet Take 1 tablet (81 mg total) by mouth  2 (two) times daily. (Patient taking differently: Take 81 mg by mouth once. ) 60 tablet 0  . atorvastatin (LIPITOR) 40 MG tablet TAKE 1 TABLET DAILY (Patient taking differently: Take 40 mg by mouth daily. ) 90 tablet 4  . carbamide peroxide (DEBROX) 6.5 % OTIC solution Place 5 drops into both ears daily as needed (for ear wax).    . Coenzyme Q10 (COQ10) 200 MG CAPS Take 200 mg by mouth at bedtime.    .  fluocinonide ointment (LIDEX) [id number] % Apply 1 application topically daily as needed (for bites). 30 g 3  . fluticasone (FLONASE) 50 MCG/ACT nasal spray Place 2 sprays into both nostrils daily as needed for allergies.     [name] loratadine (CLARITIN) 10 MG tablet Take 10 mg by mouth daily as needed for allergies.     . pantoprazole (PROTONIX) 40 MG tablet Take 1 tablet (40 mg total) by mouth daily. 90 tablet 3  . tamsulosin (FLOMAX) 0.4 MG CAPS capsule TAKE 1 CAPSULE DAILY (Patient taking differently: Take 0.4 mg by mouth daily. ) 90 capsule 3  . valsartan (DIOVAN) 80 MG tablet TAKE 1 TABLET DAILY (Patient taking differently: Take 80 mg by mouth daily. ) 90 tablet 3   No current facility-administered medications on file prior to visit.     PAST MEDICAL HISTORY: Past Medical History:  Diagnosis Date  . Alcohol abuse   . Allergy   . Back pain   . Cataract    forming  . Colon polyps   . Diverticulosis of colon   . DJD (degenerative joint disease)   . Dysrhythmia    Palpations from caffiene- reports he was having PACs due to slim fast wth caffeine - had full cardiac w/u and was told to cut back on caffeine;  now drink decaf bevrerages and no rpeort of recurrence   . Enlarged prostate   . GERD (gastroesophageal reflux disease)   . Hearing loss   . Hip osteoarthritis    Left  . History of hay fever   . Hx of cardiovascular stress test    Lexiscan Myoview (04/2013):  No ischemia, EF 61%; low risk  . Hyperlipemia   . Hypertension   . Insulin resistance    om metfromin   . Internal hemorrhoids without mention of complication   . Leg edema   . Neuroma of foot    left  . Obesity   . Osteoarthrosis, unspecified whether generalized or localized, unspecified site   . Palpitations   . 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 Primary localized osteoarthrosis of right shoulder 05/25/2016  . Recent weight loss    75lbs  . Seasonal allergies   . Sleep apnea    CPAP  . Thyroid nodule     calcified thyroid nodules- bx negative   . Tremor of both hands     PAST SURGICAL HISTORY: Past Surgical History:  Procedure Laterality Date  . COLONOSCOPY    . POLYPECTOMY    . SHOULDER SURGERY  2009   right  . TONSILLECTOMY    . TOTAL HIP ARTHROPLASTY Left 03/28/2017   Procedure: TOTAL HIP ARTHROPLASTY ANTERIOR APPROACH;  Surgeon: Frederik Pear, MD;  Location: [facility];  Service: Orthopedics;  Laterality: Left;  . TOTAL HIP ARTHROPLASTY Right 09/25/2018   Procedure: Right Anterior Hip Arthroplasty;  Surgeon: Frederik Pear, MD;  Location: [facility];  Service: Orthopedics;  Laterality: Right;  . TOTAL SHOULDER ARTHROPLASTY Left 10/14/2015   Procedure: LEFT TOTAL SHOULDER ARTHROPLASTY;  Surgeon: Marchia Bond, MD;  Location: [facility];  Service: Orthopedics;  Laterality: Left;  . TOTAL SHOULDER ARTHROPLASTY Right 05/25/2016   Procedure: TOTAL SHOULDER ARTHROPLASTY;  Surgeon: Marchia Bond, MD;  Location: [facility];  Service: Orthopedics;  Laterality: Right;    SOCIAL HISTORY: Social History   Tobacco Use  . Smoking status: Never Smoker  . Smokeless tobacco: Never Used  Substance Use Topics  . Alcohol use: Yes    Alcohol/week: 0.0 standard drinks    Comment: rare----12 a year  . Drug use: No    FAMILY HISTORY: Family History  Problem Relation Age of Onset  . Arthritis Mother   . Cancer Mother 60       breast  . Hyperlipidemia Mother   . Colon polyps Mother 76  . Hypertension Mother   . Heart disease Mother   . Depression Mother   . Anxiety disorder Mother   . Obesity Mother   . Breast cancer Mother   . Heart disease Father        CAD--passed away 2011-07-02 age 95  . Hyperlipidemia Father   . Hypertension Father   . Obesity Father   . Diabetes Father   . Depression Father   . Sleep apnea Father   . Cancer Sister 13       breast  . Breast cancer Sister   . Cancer Brother 31       prostate  . Prostate cancer Brother   . Breast cancer Other   . Prostate cancer Other   . Colon  cancer Neg Hx   . Esophageal cancer Neg Hx   . Rectal cancer Neg Hx   . Stomach cancer Neg Hx    ROS: Review of Systems  Gastrointestinal: Negative for nausea and vomiting.  Musculoskeletal:       Negative for muscle weakness.  Endo/Heme/Allergies:       Negative for hypoglycemia.   PHYSICAL EXAM: Blood pressure 124/78, pulse (!) 46, temperature 97.7 F (36.5 C), temperature source Oral, height 5\' 8"  (1.727 m), weight 235 lb (106.6 kg), SpO2 99 %. Body mass index is 35.73 kg/m. Physical Exam Vitals signs reviewed.  Constitutional:      Appearance: Normal appearance. He is obese.  Cardiovascular:     Rate and Rhythm: Normal rate.     Pulses: Normal pulses.  Pulmonary:     Effort: Pulmonary effort is normal.     Breath sounds: Normal breath sounds.  Musculoskeletal: Normal range of motion.  Skin:    General: Skin is warm and dry.  Neurological:     Mental Status: He is alert and oriented to person, place, and time.  Psychiatric:        Behavior: Behavior normal.   RECENT LABS AND TESTS: BMET    Component Value Date/Time   NA 137 09/26/2018 0326   NA 141 12/14/2017 1030   K 4.3 09/26/2018 0326   CL 107 09/26/2018 0326   CO2 26 09/26/2018 0326   GLUCOSE 142 (H) 09/26/2018 0326   BUN 13 09/26/2018 0326   BUN 19 12/14/2017 1030   CREATININE 0.82 09/26/2018 0326   CREATININE 0.93 04/23/2016 1633   CALCIUM 8.6 (L) 09/26/2018 0326   GFRNONAA >60 09/26/2018 0326   GFRAA >60 09/26/2018 0326   Lab Results  Component Value Date   HGBA1C 5.6 08/29/2018   HGBA1C 5.4 03/09/2018   HGBA1C 5.7 (H) 12/14/2017   HGBA1C 5.6 03/08/2017   HGBA1C 5.5 11/18/2016   Lab Results  Component Value Date   INSULIN 21.3 12/14/2017   CBC    Component Value Date/Time   WBC 12.8 (H) 09/26/2018 0326   RBC 4.38 09/26/2018 0326   HGB 13.4 09/26/2018 0326   HGB 15.5 12/14/2017 1030   HCT 41.8 09/26/2018 0326   HCT 45.1 12/14/2017 1030   PLT 168 09/26/2018 0326   MCV 95.4 09/26/2018  0326   MCV 90 12/14/2017 1030   MCH 30.6 09/26/2018 0326   MCHC 32.1 09/26/2018 0326   RDW 12.7 09/26/2018 0326   RDW 13.0 12/14/2017 1030   LYMPHSABS 1.4 09/21/2018 0817   LYMPHSABS 1.7 12/14/2017 1030   MONOABS 0.8 09/21/2018 0817   EOSABS 0.0 09/21/2018 0817   EOSABS 0.1 12/14/2017 1030   BASOSABS 0.0 09/21/2018 0817   BASOSABS 0.0 12/14/2017 1030   Iron/TIBC/Ferritin/ %Sat No results found for: IRON, TIBC, FERRITIN, IRONPCTSAT Lipid Panel     Component Value Date/Time   CHOL 120 08/29/2018 1017   CHOL 150 12/14/2017 1030   TRIG 50.0 08/29/2018 1017   HDL 39.70 08/29/2018 1017   HDL 52 12/14/2017 1030   CHOLHDL 3 08/29/2018 1017   VLDL 10.0 08/29/2018 1017   LDLCALC 70 08/29/2018 1017   LDLCALC 78 12/14/2017 1030   Hepatic Function Panel     Component Value Date/Time   PROT 6.0 08/29/2018 1017   PROT 6.5 12/14/2017 1030   ALBUMIN 4.1 08/29/2018 1017   ALBUMIN 4.5 12/14/2017 1030   AST 10 08/29/2018 1017   ALT 12 08/29/2018 1017   ALKPHOS 58 08/29/2018 1017   BILITOT 0.4 08/29/2018 1017   BILITOT 0.6 12/14/2017 1030   BILIDIR 0.2 06/07/2014 0909      Component Value Date/Time   TSH 1.060 12/14/2017 1030   TSH 0.62 10/03/2015 1230   TSH 0.51 10/08/2010 0859   Results for Eric [name], Eric L "MIKE" (MRN [id number]) as of 11/14/2018 07:01  Ref. Range 12/14/2017 10:30  Vitamin D, 25-Hydroxy Latest Ref Range: 30.0 - 100.0 ng/mL 23.2 (L)   OBESITY BEHAVIORAL INTERVENTION VISIT  Today's visit was #17   Starting weight: 288 lbs Starting date: 12/14/2017 Today's weight: 235 lbs  Today's date: 11/13/2018 Total lbs lost to date: 43 At least 15 minutes were spent on discussing Eric following behavioral intervention visit.    11/13/2018  Height 5\' 8"  (1.727 m)  Weight 235 lb (106.6 kg)  BMI (Calculated) 35.74  BLOOD PRESSURE - SYSTOLIC [id number]  BLOOD PRESSURE - DIASTOLIC 78   Body Fat % 123456 %  Total Body Water (lbs) 104.4 lbs   ASK: We discussed Eric diagnosis of  obesity with Eric [name] today and Eric [name] agreed to give [place] permission to discuss obesity behavioral modification therapy today.  ASSESS: Eric [name] has Eric diagnosis of obesity and his BMI today is 35.8. Eric [name] is in Eric action stage of change.   ADVISE: Eric [name] was educated on Eric multiple health risks of obesity as well as Eric benefit of weight loss to improve his health. He was advised of Eric need for long term treatment and Eric importance of lifestyle modifications to improve his current health and to decrease his risk of future health problems.  AGREE: Multiple dietary modification options and treatment options were discussed and  Eric [name] agreed to follow Eric recommendations documented in Eric [name].  ARRANGE: Eric [name] was educated on Eric importance of frequent visits to treat obesity as outlined per CMS and USPSTF guidelines and agreed to schedule his next follow up appointment today.  I,  Michaelene Song, am acting as Location manager for [name], MD I have reviewed Eric above documentation for accuracy and completeness, and I agree with Eric above. -[name], MD

## 2018-11-20 LAB — VITAMIN D 1,25 DIHYDROXY
Vitamin D 1, 25 (OH)2 Total: 76 pg/mL — ABNORMAL HIGH
Vitamin D2 1, 25 (OH)2: 38 pg/mL
Vitamin D3 1, 25 (OH)2: 38 pg/mL

## 2018-11-22 ENCOUNTER — Encounter (INDEPENDENT_AMBULATORY_CARE_PROVIDER_SITE_OTHER): Payer: Self-pay | Admitting: Family Medicine

## 2018-11-23 NOTE — Telephone Encounter (Signed)
Please advise 

## 2018-11-24 ENCOUNTER — Encounter (INDEPENDENT_AMBULATORY_CARE_PROVIDER_SITE_OTHER): Payer: Self-pay | Admitting: Family Medicine

## 2018-11-28 NOTE — Telephone Encounter (Signed)
Please advise 

## 2018-12-04 ENCOUNTER — Other Ambulatory Visit: Payer: Self-pay

## 2018-12-04 ENCOUNTER — Encounter (INDEPENDENT_AMBULATORY_CARE_PROVIDER_SITE_OTHER): Payer: Self-pay | Admitting: Family Medicine

## 2018-12-04 ENCOUNTER — Ambulatory Visit (INDEPENDENT_AMBULATORY_CARE_PROVIDER_SITE_OTHER): Payer: Medicare Other | Admitting: Family Medicine

## 2018-12-04 VITALS — BP 103/66 | HR 50 | Temp 98.2°F | Ht 68.0 in | Wt 236.0 lb

## 2018-12-04 DIAGNOSIS — Z6835 Body mass index (BMI) 35.0-35.9, adult: Secondary | ICD-10-CM

## 2018-12-04 DIAGNOSIS — E559 Vitamin D deficiency, unspecified: Secondary | ICD-10-CM

## 2018-12-04 DIAGNOSIS — E66812 Obesity, class 2: Secondary | ICD-10-CM

## 2018-12-06 NOTE — Progress Notes (Signed)
Office: [phone]  /  Fax: [phone]   HPI:   Chief Complaint: OBESITY Eric [name] is here to discuss his progress with his obesity treatment plan. He is on the Category 3 plan and is following his eating plan approximately 80 % of the time. He states he is walking 1.3 miles 35 minutes 3 times per week. Eric [name] has been deviating from his plan more, especially in the evenings. He is increasing portions, because there are more leftovers and not so much due to  Increased hunger. Eric [name] is ready to get back on track. His weight is 236 lb (107 kg) today and has had a weight gain of 1 pound over a period of 3 weeks since his last visit. He has lost 52 lbs since starting treatment with [place].  Vitamin D deficiency Eric [name] has a diagnosis of vitamin D deficiency. He is stable on vit D and denies nausea, vomiting or muscle weakness.  ASSESSMENT AND PLAN:  Vitamin D deficiency  Class 2 severe obesity with serious comorbidity and body mass index (BMI) of 35.0 to 35.9 in adult, unspecified obesity type ([place])  PLAN:  Vitamin D Deficiency Eric [name] was informed that low vitamin D levels contributes to fatigue and are associated with obesity, breast, and colon cancer. Eric [name] will continue to take prescription Vit D @50 ,000 IU every week and he will follow up for routine testing of vitamin D, at least 2-3 times per year. He was informed of the risk of over-replacement of vitamin D and agrees to not increase his dose unless he discusses this with [place] first. We will recheck labs in 2 months and Eric [name] will follow up as directed.  I spent > than 50% of the 15 minute visit on counseling as documented in the note.  Obesity Eric [name] is currently in the action stage of change. As such, his goal is to continue with weight loss efforts He has agreed to follow the Category 3 plan Eric [name] has been instructed to work up to a goal of 150 minutes of combined cardio and strengthening exercise per week for weight  loss and overall health benefits. We discussed the following Behavioral Modification Strategies today: increasing lean protein intake and keeping healthy foods in the home  Eric [name] has agreed to follow up with our clinic in 2 weeks. He was informed of the importance of frequent follow up visits to maximize his success with intensive lifestyle modifications for his multiple health conditions.  ALLERGIES: Allergies  Allergen Reactions  . Lisinopril Cough    MEDICATIONS: Current Outpatient Medications on File Prior to Visit  Medication Sig Dispense Refill  . aspirin EC 81 MG tablet Take 1 tablet (81 mg total) by mouth 2 (two) times daily. (Patient taking differently: Take 81 mg by mouth once. ) 60 tablet 0  . atorvastatin (LIPITOR) 40 MG tablet TAKE 1 TABLET DAILY (Patient taking differently: Take 40 mg by mouth daily. ) 90 tablet 4  . carbamide peroxide (DEBROX) 6.5 % OTIC solution Place 5 drops into both ears daily as needed (for ear wax).    . Coenzyme Q10 (COQ10) 200 MG CAPS Take 200 mg by mouth at bedtime.    . fluocinonide ointment (LIDEX) [id number] % Apply 1 application topically daily as needed (for bites). 30 g 3  . fluticasone (FLONASE) 50 MCG/ACT nasal spray Place 2 sprays into both nostrils daily as needed for allergies.     [name] loratadine (CLARITIN) 10 MG tablet Take 10 mg by mouth daily as needed for allergies.     [name]  metFORMIN (GLUCOPHAGE) 500 MG tablet Take 1 tablet (500 mg total) by mouth daily with breakfast. 30 tablet 0  . pantoprazole (PROTONIX) 40 MG tablet Take 1 tablet (40 mg total) by mouth daily. 90 tablet 3  . tamsulosin (FLOMAX) 0.4 MG CAPS capsule TAKE 1 CAPSULE DAILY (Patient taking differently: Take 0.4 mg by mouth daily. ) 90 capsule 3  . valsartan (DIOVAN) 80 MG tablet Take 80 mg by mouth daily.    . Vitamin D, Ergocalciferol, (DRISDOL) 1.25 MG (50000 UT) CAPS capsule Take 1 capsule (50,000 Units total) by mouth every 7 (seven) days. 4 capsule 0   No current  facility-administered medications on file prior to visit.     PAST MEDICAL HISTORY: Past Medical History:  Diagnosis Date  . Alcohol abuse   . Allergy   . Back pain   . Cataract    forming  . Colon polyps   . Diverticulosis of colon   . DJD (degenerative joint disease)   . Dysrhythmia    Palpations from caffiene- reports he was having PACs due to slim fast wth caffeine - had full cardiac w/u and was told to cut back on caffeine;  now drink decaf bevrerages and no rpeort of recurrence   . Enlarged prostate   . GERD (gastroesophageal reflux disease)   . Hearing loss   . Hip osteoarthritis    Left  . History of hay fever   . Hx of cardiovascular stress test    Lexiscan Myoview (04/2013):  No ischemia, EF 61%; low risk  . Hyperlipemia   . Hypertension   . Insulin resistance    om metfromin   . Internal hemorrhoids without mention of complication   . Leg edema   . Neuroma of foot    left  . Obesity   . Osteoarthrosis, unspecified whether generalized or localized, unspecified site   . Palpitations   . 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 Primary localized osteoarthrosis of right shoulder 05/25/2016  . Recent weight loss    75lbs  . Seasonal allergies   . Sleep apnea    CPAP  . Thyroid nodule    calcified thyroid nodules- bx negative   . Tremor of both hands     PAST SURGICAL HISTORY: Past Surgical History:  Procedure Laterality Date  . COLONOSCOPY    . POLYPECTOMY    . SHOULDER SURGERY  2009   right  . TONSILLECTOMY    . TOTAL HIP ARTHROPLASTY Left 03/28/2017   Procedure: TOTAL HIP ARTHROPLASTY ANTERIOR APPROACH;  Surgeon: Frederik Pear, MD;  Location: [facility];  Service: Orthopedics;  Laterality: Left;  . TOTAL HIP ARTHROPLASTY Right 09/25/2018   Procedure: Right Anterior Hip Arthroplasty;  Surgeon: Frederik Pear, MD;  Location: [facility];  Service: Orthopedics;  Laterality: Right;  . TOTAL SHOULDER ARTHROPLASTY Left 10/14/2015   Procedure: LEFT TOTAL SHOULDER  ARTHROPLASTY;  Surgeon: Marchia Bond, MD;  Location: [facility];  Service: Orthopedics;  Laterality: Left;  . TOTAL SHOULDER ARTHROPLASTY Right 05/25/2016   Procedure: TOTAL SHOULDER ARTHROPLASTY;  Surgeon: Marchia Bond, MD;  Location: [facility];  Service: Orthopedics;  Laterality: Right;    SOCIAL HISTORY: Social History   Tobacco Use  . Smoking status: Never Smoker  . Smokeless tobacco: Never Used  Substance Use Topics  . Alcohol use: Yes    Alcohol/week: 0.0 standard drinks    Comment: rare----12 a year  . Drug use: No    FAMILY HISTORY: Family History  Problem Relation Age  of Onset  . Arthritis Mother   . Cancer Mother 78       breast  . Hyperlipidemia Mother   . Colon polyps Mother 7  . Hypertension Mother   . Heart disease Mother   . Depression Mother   . Anxiety disorder Mother   . Obesity Mother   . Breast cancer Mother   . Heart disease Father        CAD--passed away 2011-07-01 age 47  . Hyperlipidemia Father   . Hypertension Father   . Obesity Father   . Diabetes Father   . Depression Father   . Sleep apnea Father   . Cancer Sister 24       breast  . Breast cancer Sister   . Cancer Brother 45       prostate  . Prostate cancer Brother   . Breast cancer Other   . Prostate cancer Other   . Colon cancer Neg Hx   . Esophageal cancer Neg Hx   . Rectal cancer Neg Hx   . Stomach cancer Neg Hx     ROS: Review of Systems  Constitutional: Negative for weight loss.  Gastrointestinal: Negative for nausea and vomiting.  Musculoskeletal:       Negative for muscle weakness    PHYSICAL EXAM: Blood pressure 103/66, pulse (!) 50, temperature 98.2 F (36.8 C), temperature source Oral, height 5\' 8"  (1.727 m), weight 236 lb (107 kg), SpO2 95 %. Body mass index is 35.88 kg/m. Physical Exam Vitals signs reviewed.  Constitutional:      Appearance: Normal appearance. He is well-developed. He is obese.  Cardiovascular:     Rate and Rhythm: Normal rate.  Pulmonary:      Effort: Pulmonary effort is normal.  Musculoskeletal: Normal range of motion.  Skin:    General: Skin is warm and dry.  Neurological:     Mental Status: He is alert and oriented to person, place, and time.  Psychiatric:        Mood and Affect: Mood normal.        Behavior: Behavior normal.     RECENT LABS AND TESTS: BMET    Component Value Date/Time   NA 137 09/26/2018 0326   NA 141 12/14/2017 1030   K 4.3 09/26/2018 0326   CL 107 09/26/2018 0326   CO2 26 09/26/2018 0326   GLUCOSE 142 (H) 09/26/2018 0326   BUN 13 09/26/2018 0326   BUN 19 12/14/2017 1030   CREATININE 0.82 09/26/2018 0326   CREATININE 0.93 04/23/2016 1633   CALCIUM 8.6 (L) 09/26/2018 0326   GFRNONAA >60 09/26/2018 0326   GFRAA >60 09/26/2018 0326   Lab Results  Component Value Date   HGBA1C 5.6 08/29/2018   HGBA1C 5.4 03/09/2018   HGBA1C 5.7 (H) 12/14/2017   HGBA1C 5.6 03/08/2017   HGBA1C 5.5 11/18/2016   Lab Results  Component Value Date   INSULIN 21.3 12/14/2017   CBC    Component Value Date/Time   WBC 12.8 (H) 09/26/2018 0326   RBC 4.38 09/26/2018 0326   HGB 13.4 09/26/2018 0326   HGB 15.5 12/14/2017 1030   HCT 41.8 09/26/2018 0326   HCT 45.1 12/14/2017 1030   PLT 168 09/26/2018 0326   MCV 95.4 09/26/2018 0326   MCV 90 12/14/2017 1030   MCH 30.6 09/26/2018 0326   MCHC 32.1 09/26/2018 0326   RDW 12.7 09/26/2018 0326   RDW 13.0 12/14/2017 1030   LYMPHSABS 1.4 09/21/2018 0817   LYMPHSABS 1.7  12/14/2017 1030   MONOABS 0.8 09/21/2018 0817   EOSABS 0.0 09/21/2018 0817   EOSABS 0.1 12/14/2017 1030   BASOSABS 0.0 09/21/2018 0817   BASOSABS 0.0 12/14/2017 1030   Iron/TIBC/Ferritin/ %Sat No results found for: IRON, TIBC, FERRITIN, IRONPCTSAT Lipid Panel     Component Value Date/Time   CHOL 120 08/29/2018 1017   CHOL 150 12/14/2017 1030   TRIG 50.0 08/29/2018 1017   HDL 39.70 08/29/2018 1017   HDL 52 12/14/2017 1030   CHOLHDL 3 08/29/2018 1017   VLDL 10.0 08/29/2018 1017   LDLCALC  70 08/29/2018 1017   LDLCALC 78 12/14/2017 1030   Hepatic Function Panel     Component Value Date/Time   PROT 6.0 08/29/2018 1017   PROT 6.5 12/14/2017 1030   ALBUMIN 4.1 08/29/2018 1017   ALBUMIN 4.5 12/14/2017 1030   AST 10 08/29/2018 1017   ALT 12 08/29/2018 1017   ALKPHOS 58 08/29/2018 1017   BILITOT 0.4 08/29/2018 1017   BILITOT 0.6 12/14/2017 1030   BILIDIR 0.2 06/07/2014 0909      Component Value Date/Time   TSH 1.060 12/14/2017 1030   TSH 0.62 10/03/2015 1230   TSH 0.51 10/08/2010 0859     Ref. Range 11/13/2018 12:43  Vitamin D, 25-Hydroxy Latest Ref Range: 30.0 - 100.0 ng/mL 60.7    OBESITY BEHAVIORAL INTERVENTION VISIT  Today's visit was # 18   Starting weight: 288 lbs Starting date: 12/14/2017 Today's weight : 236 lbs Today's date: 12/04/2018 Total lbs lost to date: 52    12/04/2018  Height 5\' 8"  (1.727 m)  Weight 236 lb (107 kg)  BMI (Calculated) 35.89  BLOOD PRESSURE - SYSTOLIC [id number]  BLOOD PRESSURE - DIASTOLIC 66   Body Fat % 123456 %  Total Body Water (lbs) 104.2 lbs    ASK: We discussed the diagnosis of obesity with Eric [name] today and Eric [name] agreed to give [place] permission to discuss obesity behavioral modification therapy today.  ASSESS: Eric [name] has the diagnosis of obesity and his BMI today is 35.89 Eric [name] is in the action stage of change   ADVISE: Eric [name] was educated on the multiple health risks of obesity as well as the benefit of weight loss to improve his health. He was advised of the need for long term treatment and the importance of lifestyle modifications to improve his current health and to decrease his risk of future health problems.  AGREE: Multiple dietary modification options and treatment options were discussed and  Eric [name] agreed to follow the recommendations documented in the above note.  ARRANGE: Eric [name] was educated on the importance of frequent visits to treat obesity as outlined per CMS and USPSTF guidelines and  agreed to schedule his next follow up appointment today.  I, Doreene Nest, am acting as transcriptionist for [name], MD  I have reviewed the above documentation for accuracy and completeness, and I agree with the above. -[name], MD

## 2018-12-08 LAB — VITAMIN D 25 HYDROXY (VIT D DEFICIENCY, FRACTURES): Vit D, 25-Hydroxy: 60.7 ng/mL (ref 30.0–100.0)

## 2018-12-08 LAB — SPECIMEN STATUS REPORT

## 2018-12-18 ENCOUNTER — Ambulatory Visit (INDEPENDENT_AMBULATORY_CARE_PROVIDER_SITE_OTHER): Payer: Medicare Other | Admitting: Family Medicine

## 2018-12-18 ENCOUNTER — Other Ambulatory Visit: Payer: Self-pay

## 2018-12-18 VITALS — BP 108/71 | HR 55 | Temp 97.9°F | Ht 68.0 in | Wt 232.0 lb

## 2018-12-18 DIAGNOSIS — Z6835 Body mass index (BMI) 35.0-35.9, adult: Secondary | ICD-10-CM | POA: Diagnosis not present

## 2018-12-18 DIAGNOSIS — R7303 Prediabetes: Secondary | ICD-10-CM | POA: Diagnosis not present

## 2018-12-18 DIAGNOSIS — E559 Vitamin D deficiency, unspecified: Secondary | ICD-10-CM

## 2018-12-18 MED ORDER — METFORMIN HCL 500 MG PO TABS: 500.0000 mg | ORAL_TABLET | Freq: Every day | ORAL | 0 refills | Status: DC

## 2018-12-18 MED ORDER — VITAMIN D (ERGOCALCIFEROL) 1.25 MG (50000 UNIT) PO CAPS: 50000.0000 [IU] | ORAL_CAPSULE | ORAL | 0 refills | Status: DC

## 2018-12-19 NOTE — Progress Notes (Signed)
Office: [phone]  /  Fax: [phone]   HPI:   Chief Complaint: OBESITY Eric [name] is here to discuss his progress with his obesity treatment plan. He is on the  follow the Category 3 plan and is following his eating plan approximately 90 to 95 % of the time. He states he is walking 1 1/2 miles, 1 time per week. Eric [name] continues to do well with weight loss on his plan. Hunger is controlled and he is doing well with decreasing simple carbohydrates. His weight is 232 lb (105.2 kg) today and has had a weight loss of 4 pounds over a period of 2 weeks since his last visit. He has lost 56 lbs since starting treatment with [place].  Pre-Diabetes Eric [name] has a diagnosis of prediabetes based on his elevated Hgb A1c and was informed this puts him at greater risk of developing diabetes. He is stable on metformin. He denies nausea, vomiting or hypoglycemia. Eric [name] is working on diet and he has decreased polyphagia. Eric [name] continues to work on diet and exercise to decrease risk of diabetes.   Vitamin D deficiency Eric [name] has a diagnosis of vitamin D deficiency. Eric [name] is stable on vit D and he denies nausea, vomiting or muscle weakness.  ASSESSMENT AND PLAN:  Prediabetes - Plan: metFORMIN (GLUCOPHAGE) 500 MG tablet  Vitamin D deficiency - Plan: Vitamin D, Ergocalciferol, (DRISDOL) 1.25 MG (50000 UT) CAPS capsule  Class 2 severe obesity with serious comorbidity and body mass index (BMI) of 35.0 to 35.9 in adult, unspecified obesity type Eric [name])  PLAN:  Pre-Diabetes Eric [name] will continue to work on weight loss, exercise, and decreasing simple carbohydrates in his diet to help decrease the risk of diabetes. We dicussed metformin including benefits and risks. He was informed that eating too many simple carbohydrates or too many calories at one sitting increases the likelihood of GI side effects. Noelan agrees to continue metformin 500 mg daily with breakfast #30 with no refills and follow up with [place] as  directed to monitor his progress.  Vitamin D Deficiency Eric [name] was informed that low vitamin D levels contributes to fatigue and are associated with obesity, breast, and colon cancer. Eric [name] agrees to continue to take prescription Vit D @50 ,000 IU every week #4 with no refills and she will follow up for routine testing of vitamin D, at least 2-3 times per year. He was informed of the risk of over-replacement of vitamin D and agrees to not increase his dose unless he discusses this with [place] first. Eric [name] agrees to follow up with our clinic in 2 weeks.  Obesity Eric [name] is currently in the action stage of change. As such, his goal is to continue with weight loss efforts He has agreed to follow the Category 3 plan Eric [name] has been instructed to work up to a goal of 150 minutes of combined cardio and strengthening exercise per week for weight loss and overall health benefits. We discussed the following Behavioral Modification Strategies today: increasing lean protein intake, decreasing simple carbohydrates and work on meal planning and easy cooking plans  Eric [name] has agreed to follow up with our clinic in 2 weeks. He was informed of the importance of frequent follow up visits to maximize his success with intensive lifestyle modifications for his multiple health conditions.  ALLERGIES: Allergies  Allergen Reactions  . Lisinopril Cough    MEDICATIONS: Current Outpatient Medications on File Prior to Visit  Medication Sig Dispense Refill  . aspirin EC 81 MG tablet Take 1 tablet (81 mg total)  by mouth 2 (two) times daily. (Patient taking differently: Take 81 mg by mouth once. ) 60 tablet 0  . atorvastatin (LIPITOR) 40 MG tablet TAKE 1 TABLET DAILY (Patient taking differently: Take 40 mg by mouth daily. ) 90 tablet 4  . carbamide peroxide (DEBROX) 6.5 % OTIC solution Place 5 drops into both ears daily as needed (for ear wax).    . Coenzyme Q10 (COQ10) 200 MG CAPS Take 200 mg by mouth at bedtime.     . fluocinonide ointment (LIDEX) [id number] % Apply 1 application topically daily as needed (for bites). 30 g 3  . fluticasone (FLONASE) 50 MCG/ACT nasal spray Place 2 sprays into both nostrils daily as needed for allergies.     [name] loratadine (CLARITIN) 10 MG tablet Take 10 mg by mouth daily as needed for allergies.     . pantoprazole (PROTONIX) 40 MG tablet Take 1 tablet (40 mg total) by mouth daily. 90 tablet 3  . tamsulosin (FLOMAX) 0.4 MG CAPS capsule TAKE 1 CAPSULE DAILY (Patient taking differently: Take 0.4 mg by mouth daily. ) 90 capsule 3  . valsartan (DIOVAN) 80 MG tablet Take 80 mg by mouth daily.     No current facility-administered medications on file prior to visit.     PAST MEDICAL HISTORY: Past Medical History:  Diagnosis Date  . Alcohol abuse   . Allergy   . Back pain   . Cataract    forming  . Colon polyps   . Diverticulosis of colon   . DJD (degenerative joint disease)   . Dysrhythmia    Palpations from caffiene- reports he was having PACs due to slim fast wth caffeine - had full cardiac w/u and was told to cut back on caffeine;  now drink decaf bevrerages and no rpeort of recurrence   . Enlarged prostate   . GERD (gastroesophageal reflux disease)   . Hearing loss   . Hip osteoarthritis    Left  . History of hay fever   . Hx of cardiovascular stress test    Lexiscan Myoview (04/2013):  No ischemia, EF 61%; low risk  . Hyperlipemia   . Hypertension   . Insulin resistance    om metfromin   . Internal hemorrhoids without mention of complication   . Leg edema   . Neuroma of foot    left  . Obesity   . Osteoarthrosis, unspecified whether generalized or localized, unspecified site   . Palpitations   . 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 Primary localized osteoarthrosis of right shoulder 05/25/2016  . Recent weight loss    75lbs  . Seasonal allergies   . Sleep apnea    CPAP  . Thyroid nodule    calcified thyroid nodules- bx negative   . Tremor of  both hands     PAST SURGICAL HISTORY: Past Surgical History:  Procedure Laterality Date  . COLONOSCOPY    . POLYPECTOMY    . SHOULDER SURGERY  2009   right  . TONSILLECTOMY    . TOTAL HIP ARTHROPLASTY Left 03/28/2017   Procedure: TOTAL HIP ARTHROPLASTY ANTERIOR APPROACH;  Surgeon: Frederik Pear, MD;  Location: [facility];  Service: Orthopedics;  Laterality: Left;  . TOTAL HIP ARTHROPLASTY Right 09/25/2018   Procedure: Right Anterior Hip Arthroplasty;  Surgeon: Frederik Pear, MD;  Location: [facility];  Service: Orthopedics;  Laterality: Right;  . TOTAL SHOULDER ARTHROPLASTY Left 10/14/2015   Procedure: LEFT TOTAL SHOULDER ARTHROPLASTY;  Surgeon: Marchia Bond, MD;  Location:  [facility];  Service: Orthopedics;  Laterality: Left;  . TOTAL SHOULDER ARTHROPLASTY Right 05/25/2016   Procedure: TOTAL SHOULDER ARTHROPLASTY;  Surgeon: Marchia Bond, MD;  Location: [facility];  Service: Orthopedics;  Laterality: Right;    SOCIAL HISTORY: Social History   Tobacco Use  . Smoking status: Never Smoker  . Smokeless tobacco: Never Used  Substance Use Topics  . Alcohol use: Yes    Alcohol/week: 0.0 standard drinks    Comment: rare----12 a year  . Drug use: No    FAMILY HISTORY: Family History  Problem Relation Age of Onset  . Arthritis Mother   . Cancer Mother 86       breast  . Hyperlipidemia Mother   . Colon polyps Mother 31  . Hypertension Mother   . Heart disease Mother   . Depression Mother   . Anxiety disorder Mother   . Obesity Mother   . Breast cancer Mother   . Heart disease Father        CAD--passed away 06-25-11 age 12  . Hyperlipidemia Father   . Hypertension Father   . Obesity Father   . Diabetes Father   . Depression Father   . Sleep apnea Father   . Cancer Sister 49       breast  . Breast cancer Sister   . Cancer Brother 22       prostate  . Prostate cancer Brother   . Breast cancer Other   . Prostate cancer Other   . Colon cancer Neg Hx   . Esophageal cancer Neg Hx   . Rectal cancer  Neg Hx   . Stomach cancer Neg Hx     ROS: Review of Systems  Constitutional: Positive for weight loss.  Gastrointestinal: Negative for nausea and vomiting.  Musculoskeletal:       Negative for muscle weakness  Endo/Heme/Allergies:       Negative for hypoglycemia Positive for polyphagia    PHYSICAL EXAM: Blood pressure 108/71, pulse (!) 55, temperature 97.9 F (36.6 C), temperature source Oral, height 5\' 8"  (1.727 m), weight 232 lb (105.2 kg), SpO2 97 %. Body mass index is 35.28 kg/m. Physical Exam Vitals signs reviewed.  Constitutional:      Appearance: Normal appearance. He is well-developed. He is obese.  Cardiovascular:     Rate and Rhythm: Normal rate.  Pulmonary:     Effort: Pulmonary effort is normal.  Musculoskeletal: Normal range of motion.  Skin:    General: Skin is warm and dry.  Neurological:     Mental Status: He is alert and oriented to person, place, and time.  Psychiatric:        Mood and Affect: Mood normal.        Behavior: Behavior normal.     RECENT LABS AND TESTS: BMET    Component Value Date/Time   NA 137 09/26/2018 0326   NA 141 12/14/2017 1030   K 4.3 09/26/2018 0326   CL 107 09/26/2018 0326   CO2 26 09/26/2018 0326   GLUCOSE 142 (H) 09/26/2018 0326   BUN 13 09/26/2018 0326   BUN 19 12/14/2017 1030   CREATININE 0.82 09/26/2018 0326   CREATININE 0.93 04/23/2016 1633   CALCIUM 8.6 (L) 09/26/2018 0326   GFRNONAA >60 09/26/2018 0326   GFRAA >60 09/26/2018 0326   Lab Results  Component Value Date   HGBA1C 5.6 08/29/2018   HGBA1C 5.4 03/09/2018   HGBA1C 5.7 (H) 12/14/2017   HGBA1C 5.6 03/08/2017   HGBA1C 5.5  11/18/2016   Lab Results  Component Value Date   INSULIN 21.3 12/14/2017   CBC    Component Value Date/Time   WBC 12.8 (H) 09/26/2018 0326   RBC 4.38 09/26/2018 0326   HGB 13.4 09/26/2018 0326   HGB 15.5 12/14/2017 1030   HCT 41.8 09/26/2018 0326   HCT 45.1 12/14/2017 1030   PLT 168 09/26/2018 0326   MCV 95.4  09/26/2018 0326   MCV 90 12/14/2017 1030   MCH 30.6 09/26/2018 0326   MCHC 32.1 09/26/2018 0326   RDW 12.7 09/26/2018 0326   RDW 13.0 12/14/2017 1030   LYMPHSABS 1.4 09/21/2018 0817   LYMPHSABS 1.7 12/14/2017 1030   MONOABS 0.8 09/21/2018 0817   EOSABS 0.0 09/21/2018 0817   EOSABS 0.1 12/14/2017 1030   BASOSABS 0.0 09/21/2018 0817   BASOSABS 0.0 12/14/2017 1030   Iron/TIBC/Ferritin/ %Sat No results found for: IRON, TIBC, FERRITIN, IRONPCTSAT Lipid Panel     Component Value Date/Time   CHOL 120 08/29/2018 1017   CHOL 150 12/14/2017 1030   TRIG 50.0 08/29/2018 1017   HDL 39.70 08/29/2018 1017   HDL 52 12/14/2017 1030   CHOLHDL 3 08/29/2018 1017   VLDL 10.0 08/29/2018 1017   LDLCALC 70 08/29/2018 1017   LDLCALC 78 12/14/2017 1030   Hepatic Function Panel     Component Value Date/Time   PROT 6.0 08/29/2018 1017   PROT 6.5 12/14/2017 1030   ALBUMIN 4.1 08/29/2018 1017   ALBUMIN 4.5 12/14/2017 1030   AST 10 08/29/2018 1017   ALT 12 08/29/2018 1017   ALKPHOS 58 08/29/2018 1017   BILITOT 0.4 08/29/2018 1017   BILITOT 0.6 12/14/2017 1030   BILIDIR 0.2 06/07/2014 0909      Component Value Date/Time   TSH 1.060 12/14/2017 1030   TSH 0.62 10/03/2015 1230   TSH 0.51 10/08/2010 0859     Ref. Range 11/13/2018 12:43  Vitamin D, 25-Hydroxy Latest Ref Range: 30.0 - 100.0 ng/mL 60.7    OBESITY BEHAVIORAL INTERVENTION VISIT  Today's visit was # 19   Starting weight: 288 lbs Starting date: 12/14/2017 Today's weight : 232 lbs Today's date: 12/18/2018 Total lbs lost to date: 56    12/18/2018  Height 5\' 8"  (1.727 m)  Weight 232 lb (105.2 kg)  BMI (Calculated) 35.28  BLOOD PRESSURE - SYSTOLIC [id number]  BLOOD PRESSURE - DIASTOLIC 71   Body Fat % [id number] %  Total Body Water (lbs) 102.6 lbs    ASK: We discussed the diagnosis of obesity with [name] today and Petra agreed to give [place] permission to discuss obesity behavioral modification therapy today.  ASSESS: Kolbe  has the diagnosis of obesity and his BMI today is 35.28 Anastasio is in the action stage of change   ADVISE: Quadir was educated on the multiple health risks of obesity as well as the benefit of weight loss to improve his health. He was advised of the need for long term treatment and the importance of lifestyle modifications to improve his current health and to decrease his risk of future health problems.  AGREE: Multiple dietary modification options and treatment options were discussed and  Dajour agreed to follow the recommendations documented in the above note.  ARRANGE: Caeson was educated on the importance of frequent visits to treat obesity as outlined per CMS and USPSTF guidelines and agreed to schedule his next follow up appointment today.  Corey Skains, am acting as transcriptionist for [name], MD I have reviewed the above documentation  for accuracy and completeness, and I agree with the above. -[name], MD

## 2019-01-02 ENCOUNTER — Encounter (INDEPENDENT_AMBULATORY_CARE_PROVIDER_SITE_OTHER): Payer: Self-pay | Admitting: Physician Assistant

## 2019-01-02 ENCOUNTER — Ambulatory Visit (INDEPENDENT_AMBULATORY_CARE_PROVIDER_SITE_OTHER): Payer: Medicare Other | Admitting: Physician Assistant

## 2019-01-02 ENCOUNTER — Other Ambulatory Visit: Payer: Self-pay

## 2019-01-02 VITALS — BP 122/65 | HR 51 | Temp 98.0°F | Ht 68.0 in | Wt 228.0 lb

## 2019-01-02 DIAGNOSIS — E559 Vitamin D deficiency, unspecified: Secondary | ICD-10-CM

## 2019-01-02 DIAGNOSIS — E669 Obesity, unspecified: Secondary | ICD-10-CM | POA: Diagnosis not present

## 2019-01-02 DIAGNOSIS — Z6834 Body mass index (BMI) 34.0-34.9, adult: Secondary | ICD-10-CM

## 2019-01-03 NOTE — Progress Notes (Signed)
Office: [phone]  /  Fax: [phone]   HPI:   Chief Complaint: OBESITY Eric [name] is here to discuss his progress with his obesity treatment plan. He is on the Category 3 plan and is following his eating plan approximately 95 % of the time. He states he is walking 1 1/2 miles 2 to 3 times per week. Eric [name] did very well over the last two weeks. He reports that he has not struggled with the plan. His weight is 228 lb (103.4 kg) today and has had a weight loss of 4 pounds over a period of 2 weeks since his last visit. He has lost 60 lbs since starting treatment with [place].  Vitamin D deficiency Eric [name] has a diagnosis of vitamin D deficiency. Eric [name] is currently taking vit D and he denies nausea, vomiting or muscle weakness.  ASSESSMENT AND PLAN:  Vitamin D deficiency  Class 1 obesity with serious comorbidity and body mass index (BMI) of 34.0 to 34.9 in adult, unspecified obesity type  PLAN:  Vitamin D Deficiency Eric [name] was informed that low vitamin D levels contributes to fatigue and are associated with obesity, breast, and colon cancer. Eric [name] will continue to take prescription Vit D @50 ,000 IU every week and he will follow up for routine testing of vitamin D, at least 2-3 times per year. He was informed of the risk of over-replacement of vitamin D and agrees to not increase his dose unless he discusses this with [place] first.  Obesity Eric [name] is currently in the action stage of change. As such, his goal is to continue with weight loss efforts He has agreed to follow the Category 3 plan Eric [name] has been instructed to work up to a goal of 150 minutes of combined cardio and strengthening exercise per week for weight loss and overall health benefits. We discussed the following Behavioral Modification Strategies today: keeping healthy foods in the home and work on meal planning and easy cooking plans  Eric [name] has agreed to follow up with our clinic in 2 weeks. He was informed of the  importance of frequent follow up visits to maximize his success with intensive lifestyle modifications for his multiple health conditions.  I spent > than 50% of the 25 minute visit on counseling as documented in the note.    ALLERGIES: Allergies  Allergen Reactions   Lisinopril Cough    MEDICATIONS: Current Outpatient Medications on File Prior to Visit  Medication Sig Dispense Refill   aspirin EC 81 MG tablet Take 1 tablet (81 mg total) by mouth 2 (two) times daily. (Patient taking differently: Take 81 mg by mouth once. ) 60 tablet 0   atorvastatin (LIPITOR) 40 MG tablet TAKE 1 TABLET DAILY (Patient taking differently: Take 40 mg by mouth daily. ) 90 tablet 4   carbamide peroxide (DEBROX) 6.5 % OTIC solution Place 5 drops into both ears daily as needed (for ear wax).     Coenzyme Q10 (COQ10) 200 MG CAPS Take 200 mg by mouth at bedtime.     fluocinonide ointment (LIDEX) [id number] % Apply 1 application topically daily as needed (for bites). 30 g 3   fluticasone (FLONASE) 50 MCG/ACT nasal spray Place 2 sprays into both nostrils daily as needed for allergies.      loratadine (CLARITIN) 10 MG tablet Take 10 mg by mouth daily as needed for allergies.      metFORMIN (GLUCOPHAGE) 500 MG tablet Take 1 tablet (500 mg total) by mouth daily with breakfast. 30 tablet 0   pantoprazole (  PROTONIX) 40 MG tablet Take 1 tablet (40 mg total) by mouth daily. 90 tablet 3   tamsulosin (FLOMAX) 0.4 MG CAPS capsule TAKE 1 CAPSULE DAILY (Patient taking differently: Take 0.4 mg by mouth daily. ) 90 capsule 3   valsartan (DIOVAN) 80 MG tablet Take 80 mg by mouth daily.     Vitamin D, Ergocalciferol, (DRISDOL) 1.25 MG (50000 UT) CAPS capsule Take 1 capsule (50,000 Units total) by mouth every 7 (seven) days. 4 capsule 0   No current facility-administered medications on file prior to visit.     PAST MEDICAL HISTORY: Past Medical History:  Diagnosis Date   Alcohol abuse    Allergy    Back pain     Cataract    forming   Colon polyps    Diverticulosis of colon    DJD (degenerative joint disease)    Dysrhythmia    Palpations from caffiene- reports he was having PACs due to slim fast wth caffeine - had full cardiac w/u and was told to cut back on caffeine;  now drink decaf bevrerages and no rpeort of recurrence    Enlarged prostate    GERD (gastroesophageal reflux disease)    Hearing loss    Hip osteoarthritis    Left   History of hay fever    Hx of cardiovascular stress test    Lexiscan Myoview (04/2013):  No ischemia, EF 61%; low risk   Hyperlipemia    Hypertension    Insulin resistance    om metfromin    Internal hemorrhoids without mention of complication    Leg edema    Neuroma of foot    left   Obesity    Osteoarthrosis, unspecified whether generalized or localized, unspecified site    Palpitations    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   Primary localized osteoarthrosis of right shoulder 05/25/2016   Recent weight loss    75lbs   Seasonal allergies    Sleep apnea    CPAP   Thyroid nodule    calcified thyroid nodules- bx negative    Tremor of both hands     PAST SURGICAL HISTORY: Past Surgical History:  Procedure Laterality Date   COLONOSCOPY     POLYPECTOMY     SHOULDER SURGERY  2009   right   TONSILLECTOMY     TOTAL HIP ARTHROPLASTY Left 03/28/2017   Procedure: TOTAL HIP ARTHROPLASTY ANTERIOR APPROACH;  Surgeon: Frederik Pear, MD;  Location: [facility];  Service: Orthopedics;  Laterality: Left;   TOTAL HIP ARTHROPLASTY Right 09/25/2018   Procedure: Right Anterior Hip Arthroplasty;  Surgeon: Frederik Pear, MD;  Location: [facility];  Service: Orthopedics;  Laterality: Right;   TOTAL SHOULDER ARTHROPLASTY Left 10/14/2015   Procedure: LEFT TOTAL SHOULDER ARTHROPLASTY;  Surgeon: Marchia Bond, MD;  Location: [facility];  Service: Orthopedics;  Laterality: Left;   TOTAL SHOULDER ARTHROPLASTY Right 05/25/2016   Procedure: TOTAL  SHOULDER ARTHROPLASTY;  Surgeon: Marchia Bond, MD;  Location: [facility];  Service: Orthopedics;  Laterality: Right;    SOCIAL HISTORY: Social History   Tobacco Use   Smoking status: Never Smoker   Smokeless tobacco: Never Used  Substance Use Topics   Alcohol use: Yes    Alcohol/week: 0.0 standard drinks    Comment: rare----12 a year   Drug use: No    FAMILY HISTORY: Family History  Problem Relation Age of Onset   Arthritis Mother    Cancer Mother 2       breast   Hyperlipidemia  Mother    Colon polyps Mother 58   Hypertension Mother    Heart disease Mother    Depression Mother    Anxiety disorder Mother    Obesity Mother    Breast cancer Mother    Heart disease Father        CAD--passed away 2011/07/05 age 37   Hyperlipidemia Father    Hypertension Father    Obesity Father    Diabetes Father    Depression Father    Sleep apnea Father    Cancer Sister 18       breast   Breast cancer Sister    Cancer Brother 51       prostate   Prostate cancer Brother    Breast cancer Other    Prostate cancer Other    Colon cancer Neg Hx    Esophageal cancer Neg Hx    Rectal cancer Neg Hx    Stomach cancer Neg Hx     ROS: Review of Systems  Constitutional: Positive for weight loss.  Gastrointestinal: Negative for nausea and vomiting.  Musculoskeletal:       Negative for muscle weakness    PHYSICAL EXAM: Blood pressure 122/65, pulse (!) 51, temperature 98 F (36.7 C), temperature source Oral, height 5\' 8"  (1.727 m), weight 228 lb (103.4 kg), SpO2 97 %. Body mass index is 34.67 kg/m. Physical Exam Vitals signs reviewed.  Constitutional:      Appearance: Normal appearance. He is well-developed. He is obese.  Cardiovascular:     Rate and Rhythm: Normal rate.  Pulmonary:     Effort: Pulmonary effort is normal.  Musculoskeletal: Normal range of motion.  Skin:    General: Skin is warm and dry.  Neurological:     Mental Status: He is alert and  oriented to person, place, and time.  Psychiatric:        Mood and Affect: Mood normal.        Behavior: Behavior normal.     RECENT LABS AND TESTS: BMET    Component Value Date/Time   NA 137 09/26/2018 0326   NA 141 12/14/2017 1030   K 4.3 09/26/2018 0326   CL 107 09/26/2018 0326   CO2 26 09/26/2018 0326   GLUCOSE 142 (H) 09/26/2018 0326   BUN 13 09/26/2018 0326   BUN 19 12/14/2017 1030   CREATININE 0.82 09/26/2018 0326   CREATININE 0.93 04/23/2016 1633   CALCIUM 8.6 (L) 09/26/2018 0326   GFRNONAA >60 09/26/2018 0326   GFRAA >60 09/26/2018 0326   Lab Results  Component Value Date   HGBA1C 5.6 08/29/2018   HGBA1C 5.4 03/09/2018   HGBA1C 5.7 (H) 12/14/2017   HGBA1C 5.6 03/08/2017   HGBA1C 5.5 11/18/2016   Lab Results  Component Value Date   INSULIN 21.3 12/14/2017   CBC    Component Value Date/Time   WBC 12.8 (H) 09/26/2018 0326   RBC 4.38 09/26/2018 0326   HGB 13.4 09/26/2018 0326   HGB 15.5 12/14/2017 1030   HCT 41.8 09/26/2018 0326   HCT 45.1 12/14/2017 1030   PLT 168 09/26/2018 0326   MCV 95.4 09/26/2018 0326   MCV 90 12/14/2017 1030   MCH 30.6 09/26/2018 0326   MCHC 32.1 09/26/2018 0326   RDW 12.7 09/26/2018 0326   RDW 13.0 12/14/2017 1030   LYMPHSABS 1.4 09/21/2018 0817   LYMPHSABS 1.7 12/14/2017 1030   MONOABS 0.8 09/21/2018 0817   EOSABS 0.0 09/21/2018 0817   EOSABS 0.1 12/14/2017 1030  BASOSABS 0.0 09/21/2018 0817   BASOSABS 0.0 12/14/2017 1030   Iron/TIBC/Ferritin/ %Sat No results found for: IRON, TIBC, FERRITIN, IRONPCTSAT Lipid Panel     Component Value Date/Time   CHOL 120 08/29/2018 1017   CHOL 150 12/14/2017 1030   TRIG 50.0 08/29/2018 1017   HDL 39.70 08/29/2018 1017   HDL 52 12/14/2017 1030   CHOLHDL 3 08/29/2018 1017   VLDL 10.0 08/29/2018 1017   LDLCALC 70 08/29/2018 1017   LDLCALC 78 12/14/2017 1030   Hepatic Function Panel     Component Value Date/Time   PROT 6.0 08/29/2018 1017   PROT 6.5 12/14/2017 1030   ALBUMIN  4.1 08/29/2018 1017   ALBUMIN 4.5 12/14/2017 1030   AST 10 08/29/2018 1017   ALT 12 08/29/2018 1017   ALKPHOS 58 08/29/2018 1017   BILITOT 0.4 08/29/2018 1017   BILITOT 0.6 12/14/2017 1030   BILIDIR 0.2 06/07/2014 0909      Component Value Date/Time   TSH 1.060 12/14/2017 1030   TSH 0.62 10/03/2015 1230   TSH 0.51 10/08/2010 0859     Ref. Range 11/13/2018 12:43  Vitamin D, 25-Hydroxy Latest Ref Range: 30.0 - 100.0 ng/mL 60.7    OBESITY BEHAVIORAL INTERVENTION VISIT  Today's visit was # 20   Starting weight: 288 lbs Starting date: 12/14/2017 Today's weight : 228 lbs Today's date: 01/02/2019 Total lbs lost to date: 60    01/02/2019  Height 5\' 8"  (1.727 m)  Weight 228 lb (103.4 kg)  BMI (Calculated) 34.68  BLOOD PRESSURE - SYSTOLIC [id number]  BLOOD PRESSURE - DIASTOLIC 65   Body Fat % 123XX123 %  Total Body Water (lbs) 100.4 lbs    ASK: We discussed the diagnosis of obesity with Eric [name] today and Kirklin agreed to give [place] permission to discuss obesity behavioral modification therapy today.  ASSESS: Eric [name] has the diagnosis of obesity and his BMI today is 34.68 Eric [name] is in the action stage of change   ADVISE: Eric [name] was educated on the multiple health risks of obesity as well as the benefit of weight loss to improve his health. He was advised of the need for long term treatment and the importance of lifestyle modifications to improve his current health and to decrease his risk of future health problems.  AGREE: Multiple dietary modification options and treatment options were discussed and  Eric [name] agreed to follow the recommendations documented in the above note.  ARRANGE: Eric [name] was educated on the importance of frequent visits to treat obesity as outlined per CMS and USPSTF guidelines and agreed to schedule his next follow up appointment today.  Corey Skains, am acting as transcriptionist for Abby Potash, PA-C I, Abby Potash, PA-C have reviewed  above note and agree with its content

## 2019-01-09 DIAGNOSIS — L570 Actinic keratosis: Secondary | ICD-10-CM | POA: Diagnosis not present

## 2019-01-09 DIAGNOSIS — M71341 Other bursal cyst, right hand: Secondary | ICD-10-CM | POA: Diagnosis not present

## 2019-01-14 ENCOUNTER — Other Ambulatory Visit: Payer: Self-pay | Admitting: Family Medicine

## 2019-01-16 ENCOUNTER — Ambulatory Visit (INDEPENDENT_AMBULATORY_CARE_PROVIDER_SITE_OTHER): Payer: Medicare Other | Admitting: Family Medicine

## 2019-01-16 ENCOUNTER — Other Ambulatory Visit: Payer: Self-pay

## 2019-01-16 ENCOUNTER — Encounter (INDEPENDENT_AMBULATORY_CARE_PROVIDER_SITE_OTHER): Payer: Self-pay | Admitting: Family Medicine

## 2019-01-16 VITALS — BP 97/64 | HR 47 | Temp 98.0°F | Ht 68.0 in | Wt 229.0 lb

## 2019-01-16 DIAGNOSIS — E559 Vitamin D deficiency, unspecified: Secondary | ICD-10-CM

## 2019-01-16 DIAGNOSIS — R7303 Prediabetes: Secondary | ICD-10-CM

## 2019-01-16 DIAGNOSIS — E669 Obesity, unspecified: Secondary | ICD-10-CM | POA: Diagnosis not present

## 2019-01-16 DIAGNOSIS — Z6834 Body mass index (BMI) 34.0-34.9, adult: Secondary | ICD-10-CM | POA: Diagnosis not present

## 2019-01-16 MED ORDER — VITAMIN D (ERGOCALCIFEROL) 1.25 MG (50000 UNIT) PO CAPS: 50000.0000 [IU] | ORAL_CAPSULE | ORAL | 0 refills | Status: DC

## 2019-01-16 MED ORDER — METFORMIN HCL 500 MG PO TABS: 500.0000 mg | ORAL_TABLET | Freq: Every day | ORAL | 0 refills | Status: DC

## 2019-01-17 NOTE — Progress Notes (Signed)
Office: [phone]  /  Fax: [phone]   HPI:   Chief Complaint: OBESITY Eric [name] is here to discuss his progress with his obesity treatment plan. He is on the Category 3 plan and is following his eating plan approximately 80 % of the time. He states he is walking 45 minutes 1 time per week. Eric [name] likes the Category 3 plan very well. He has been deer hunting recently and he ate off the plan for a few days. He is now back on the plan. His weight is 229 lb (103.9 kg) today and has had a weight loss of 1 pound over a period of 2 weeks since his last visit. He has lost 59 lbs since starting treatment with [place].  Vitamin D deficiency Eric [name] has a diagnosis of vitamin D deficiency. His last vitamin D level was at 60.7 and is at goal but not over replaced. Eric [name] is currently taking vit D and he denies nausea, vomiting or muscle weakness.  Pre-Diabetes Eric [name] has a diagnosis of prediabetes based on his elevated Hgb A1c and was informed this puts him at greater risk of developing diabetes. His last A1c was at 5.6. Eric [name] is on metformin once in the morning, and he feels it helps with polyphagia. He continues to work on diet and exercise to decrease risk of diabetes. He denies polyphagia.  ASSESSMENT AND PLAN:  Vitamin D deficiency - Plan: Vitamin D, Ergocalciferol, (DRISDOL) 1.25 MG (50000 UT) CAPS capsule  Prediabetes - Plan: metFORMIN (GLUCOPHAGE) 500 MG tablet  Class 1 obesity with serious comorbidity and body mass index (BMI) of 34.0 to 34.9 in adult, unspecified obesity type  PLAN:  Vitamin D Deficiency Eric [name] was informed that low vitamin D levels contributes to fatigue and are associated with obesity, breast, and colon cancer. Eric [name] agrees to continue to take prescription Vit D @50 ,000 IU every week #4 with no refills and he will follow up for routine testing of vitamin D, at least 2-3 times per year. He was informed of the risk of over-replacement of vitamin D and agrees to  not increase his dose unless he discusses this with [place] first. We will check vitamin D level in 1 month and Eric [name] agrees to follow up as directed.  Pre-Diabetes Eric [name] will continue to work on weight loss, exercise, and decreasing simple carbohydrates in his diet to help decrease the risk of diabetes.  Eric [name] agrees to continue metformin 500 mg daily with breakfast #30 with no refills and follow up with [place] as directed to monitor his progress.  Obesity Eric [name] is currently in the action stage of change. As such, his goal is to continue with weight loss efforts He has agreed to follow the Category 3 plan Eric [name] will increase walking to 3 times per week for weight loss and overall health benefits. We discussed the following Behavioral Modification Strategies today: increasing vegetables and planning for success  Eric [name] has agreed to follow up with our clinic in 2 to 3 weeks. He was informed of the importance of frequent follow up visits to maximize his success with intensive lifestyle modifications for his multiple health conditions.  ALLERGIES: Allergies  Allergen Reactions  . Lisinopril Cough    MEDICATIONS: Current Outpatient Medications on File Prior to Visit  Medication Sig Dispense Refill  . aspirin EC 81 MG tablet Take 1 tablet (81 mg total) by mouth 2 (two) times daily. (Patient taking differently: Take 81 mg by mouth once. ) 60 tablet 0  . atorvastatin (LIPITOR) 40 MG  tablet TAKE 1 TABLET DAILY (Patient taking differently: Take 40 mg by mouth daily. ) 90 tablet 4  . carbamide peroxide (DEBROX) 6.5 % OTIC solution Place 5 drops into both ears daily as needed (for ear wax).    . Coenzyme Q10 (COQ10) 200 MG CAPS Take 200 mg by mouth at bedtime.    . fluocinonide ointment (LIDEX) [id number] % Apply 1 application topically daily as needed (for bites). 30 g 3  . fluticasone (FLONASE) 50 MCG/ACT nasal spray Place 2 sprays into both nostrils daily as needed for allergies.     [name] loratadine  (CLARITIN) 10 MG tablet Take 10 mg by mouth daily as needed for allergies.     . meloxicam (MOBIC) 15 MG tablet TAKE 1 TABLET DAILY 90 tablet 0  . pantoprazole (PROTONIX) 40 MG tablet Take 1 tablet (40 mg total) by mouth daily. 90 tablet 3  . tamsulosin (FLOMAX) 0.4 MG CAPS capsule TAKE 1 CAPSULE DAILY (Patient taking differently: Take 0.4 mg by mouth daily. ) 90 capsule 3  . valsartan (DIOVAN) 80 MG tablet Take 80 mg by mouth daily.    . Zinc 50 MG CAPS Take by mouth daily.     No current facility-administered medications on file prior to visit.     PAST MEDICAL HISTORY: Past Medical History:  Diagnosis Date  . Alcohol abuse   . Allergy   . Back pain   . Cataract    forming  . Colon polyps   . Diverticulosis of colon   . DJD (degenerative joint disease)   . Dysrhythmia    Palpations from caffiene- reports he was having PACs due to slim fast wth caffeine - had full cardiac w/u and was told to cut back on caffeine;  now drink decaf bevrerages and no rpeort of recurrence   . Enlarged prostate   . GERD (gastroesophageal reflux disease)   . Hearing loss   . Hip osteoarthritis    Left  . History of hay fever   . Hx of cardiovascular stress test    Lexiscan Myoview (04/2013):  No ischemia, EF 61%; low risk  . Hyperlipemia   . Hypertension   . Insulin resistance    om metfromin   . Internal hemorrhoids without mention of complication   . Leg edema   . Neuroma of foot    left  . Obesity   . Osteoarthrosis, unspecified whether generalized or localized, unspecified site   . Palpitations   . 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 Primary localized osteoarthrosis of right shoulder 05/25/2016  . Recent weight loss    75lbs  . Seasonal allergies   . Sleep apnea    CPAP  . Thyroid nodule    calcified thyroid nodules- bx negative   . Tremor of both hands     PAST SURGICAL HISTORY: Past Surgical History:  Procedure Laterality Date  . COLONOSCOPY    . POLYPECTOMY     . SHOULDER SURGERY  2009   right  . TONSILLECTOMY    . TOTAL HIP ARTHROPLASTY Left 03/28/2017   Procedure: TOTAL HIP ARTHROPLASTY ANTERIOR APPROACH;  Surgeon: Frederik Pear, MD;  Location: [facility];  Service: Orthopedics;  Laterality: Left;  . TOTAL HIP ARTHROPLASTY Right 09/25/2018   Procedure: Right Anterior Hip Arthroplasty;  Surgeon: Frederik Pear, MD;  Location: [facility];  Service: Orthopedics;  Laterality: Right;  . TOTAL SHOULDER ARTHROPLASTY Left 10/14/2015   Procedure: LEFT TOTAL SHOULDER ARTHROPLASTY;  Surgeon: Marchia Bond, MD;  Location: [place];  Service: Orthopedics;  Laterality: Left;  . TOTAL SHOULDER ARTHROPLASTY Right 05/25/2016   Procedure: TOTAL SHOULDER ARTHROPLASTY;  Surgeon: Marchia Bond, MD;  Location: [facility];  Service: Orthopedics;  Laterality: Right;    SOCIAL HISTORY: Social History   Tobacco Use  . Smoking status: Never Smoker  . Smokeless tobacco: Never Used  Substance Use Topics  . Alcohol use: Yes    Alcohol/week: 0.0 standard drinks    Comment: rare----12 a year  . Drug use: No    FAMILY HISTORY: Family History  Problem Relation Age of Onset  . Arthritis Mother   . Cancer Mother 57       breast  . Hyperlipidemia Mother   . Colon polyps Mother 22  . Hypertension Mother   . Heart disease Mother   . Depression Mother   . Anxiety disorder Mother   . Obesity Mother   . Breast cancer Mother   . Heart disease Father        CAD--passed away Jun 25, 2011 age 61  . Hyperlipidemia Father   . Hypertension Father   . Obesity Father   . Diabetes Father   . Depression Father   . Sleep apnea Father   . Cancer Sister 38       breast  . Breast cancer Sister   . Cancer Brother 70       prostate  . Prostate cancer Brother   . Breast cancer Other   . Prostate cancer Other   . Colon cancer Neg Hx   . Esophageal cancer Neg Hx   . Rectal cancer Neg Hx   . Stomach cancer Neg Hx     ROS: Review of Systems  Constitutional: Negative for weight loss.  Gastrointestinal:  Negative for nausea and vomiting.  Musculoskeletal:       Negative for muscle weakness  Endo/Heme/Allergies:       Negative for polyphagia    PHYSICAL EXAM: Blood pressure 97/64, pulse (!) 47, temperature 98 F (36.7 C), temperature source Oral, height 5\' 8"  (1.727 m), weight 229 lb (103.9 kg), SpO2 98 %. Body mass index is 34.82 kg/m. Physical Exam Vitals signs reviewed.  Constitutional:      Appearance: Normal appearance. He is well-developed. He is obese.  Cardiovascular:     Rate and Rhythm: Normal rate.  Pulmonary:     Effort: Pulmonary effort is normal.  Musculoskeletal: Normal range of motion.  Skin:    General: Skin is warm and dry.  Neurological:     Mental Status: He is alert and oriented to person, place, and time.  Psychiatric:        Mood and Affect: Mood normal.        Behavior: Behavior normal.     RECENT LABS AND TESTS: BMET    Component Value Date/Time   NA 137 09/26/2018 0326   NA 141 12/14/2017 1030   K 4.3 09/26/2018 0326   CL 107 09/26/2018 0326   CO2 26 09/26/2018 0326   GLUCOSE 142 (H) 09/26/2018 0326   BUN 13 09/26/2018 0326   BUN 19 12/14/2017 1030   CREATININE 0.82 09/26/2018 0326   CREATININE 0.93 04/23/2016 1633   CALCIUM 8.6 (L) 09/26/2018 0326   GFRNONAA >60 09/26/2018 0326   GFRAA >60 09/26/2018 0326   Lab Results  Component Value Date   HGBA1C 5.6 08/29/2018   HGBA1C 5.4 03/09/2018   HGBA1C 5.7 (H) 12/14/2017   HGBA1C 5.6 03/08/2017   HGBA1C 5.5 11/18/2016  Lab Results  Component Value Date   INSULIN 21.3 12/14/2017   CBC    Component Value Date/Time   WBC 12.8 (H) 09/26/2018 0326   RBC 4.38 09/26/2018 0326   HGB 13.4 09/26/2018 0326   HGB 15.5 12/14/2017 1030   HCT 41.8 09/26/2018 0326   HCT 45.1 12/14/2017 1030   PLT 168 09/26/2018 0326   MCV 95.4 09/26/2018 0326   MCV 90 12/14/2017 1030   MCH 30.6 09/26/2018 0326   MCHC 32.1 09/26/2018 0326   RDW 12.7 09/26/2018 0326   RDW 13.0 12/14/2017 1030   LYMPHSABS  1.4 09/21/2018 0817   LYMPHSABS 1.7 12/14/2017 1030   MONOABS 0.8 09/21/2018 0817   EOSABS 0.0 09/21/2018 0817   EOSABS 0.1 12/14/2017 1030   BASOSABS 0.0 09/21/2018 0817   BASOSABS 0.0 12/14/2017 1030   Iron/TIBC/Ferritin/ %Sat No results found for: IRON, TIBC, FERRITIN, IRONPCTSAT Lipid Panel     Component Value Date/Time   CHOL 120 08/29/2018 1017   CHOL 150 12/14/2017 1030   TRIG 50.0 08/29/2018 1017   HDL 39.70 08/29/2018 1017   HDL 52 12/14/2017 1030   CHOLHDL 3 08/29/2018 1017   VLDL 10.0 08/29/2018 1017   LDLCALC 70 08/29/2018 1017   LDLCALC 78 12/14/2017 1030   Hepatic Function Panel     Component Value Date/Time   PROT 6.0 08/29/2018 1017   PROT 6.5 12/14/2017 1030   ALBUMIN 4.1 08/29/2018 1017   ALBUMIN 4.5 12/14/2017 1030   AST 10 08/29/2018 1017   ALT 12 08/29/2018 1017   ALKPHOS 58 08/29/2018 1017   BILITOT 0.4 08/29/2018 1017   BILITOT 0.6 12/14/2017 1030   BILIDIR 0.2 06/07/2014 0909      Component Value Date/Time   TSH 1.060 12/14/2017 1030   TSH 0.62 10/03/2015 1230   TSH 0.51 10/08/2010 0859     Ref. Range 11/13/2018 12:43  Vitamin D, 25-Hydroxy Latest Ref Range: 30.0 - 100.0 ng/mL 60.7    OBESITY BEHAVIORAL INTERVENTION VISIT  Today's visit was # 21   Starting weight: 288 lbs Starting date: 12/14/2017 Today's weight : 229 lbs Today's date: 01/16/2019 Total lbs lost to date: 59    01/16/2019  Height 5\' 8"  (1.727 m)  Weight 229 lb (103.9 kg)  BMI (Calculated) 34.83  BLOOD PRESSURE - SYSTOLIC 97  BLOOD PRESSURE - DIASTOLIC 64   Body Fat % [id number] %  Total Body Water (lbs) 101 lbs    ASK: We discussed the diagnosis of obesity with Eric [name] today and Eric [name] agreed to give [place] permission to discuss obesity behavioral modification therapy today.  ASSESS: Eric [name] has the diagnosis of obesity and his BMI today is 34.83 Eric [name] is in the action stage of change   ADVISE: Eric [name] was educated on the multiple health risks of  obesity as well as the benefit of weight loss to improve his health. He was advised of the need for long term treatment and the importance of lifestyle modifications to improve his current health and to decrease his risk of future health problems.  AGREE: Multiple dietary modification options and treatment options were discussed and  Eric [name] agreed to follow the recommendations documented in the above note.  ARRANGE: Eric [name] was educated on the importance of frequent visits to treat obesity as outlined per CMS and USPSTF guidelines and agreed to schedule his next follow up appointment today.  Corey Skains, am acting as Location manager for [employer], FNP-C.  I have reviewed the above documentation for accuracy  and completeness, and I agree with the above.  - Chinwe Lope, FNP-C.

## 2019-01-18 ENCOUNTER — Encounter (INDEPENDENT_AMBULATORY_CARE_PROVIDER_SITE_OTHER): Payer: Self-pay | Admitting: Family Medicine

## 2019-02-05 ENCOUNTER — Ambulatory Visit (INDEPENDENT_AMBULATORY_CARE_PROVIDER_SITE_OTHER): Payer: Medicare Other | Admitting: Family Medicine

## 2019-02-05 ENCOUNTER — Other Ambulatory Visit: Payer: Self-pay

## 2019-02-05 ENCOUNTER — Encounter (INDEPENDENT_AMBULATORY_CARE_PROVIDER_SITE_OTHER): Payer: Self-pay | Admitting: Family Medicine

## 2019-02-05 VITALS — BP 117/73 | HR 60 | Temp 98.0°F | Ht 68.0 in | Wt 230.0 lb

## 2019-02-05 DIAGNOSIS — R7303 Prediabetes: Secondary | ICD-10-CM

## 2019-02-05 DIAGNOSIS — Z6835 Body mass index (BMI) 35.0-35.9, adult: Secondary | ICD-10-CM

## 2019-02-05 DIAGNOSIS — E559 Vitamin D deficiency, unspecified: Secondary | ICD-10-CM

## 2019-02-05 MED ORDER — METFORMIN HCL 500 MG PO TABS: 500.0000 mg | ORAL_TABLET | Freq: Every day | ORAL | 0 refills | Status: DC

## 2019-02-05 MED ORDER — VITAMIN D (ERGOCALCIFEROL) 1.25 MG (50000 UNIT) PO CAPS: 50000.0000 [IU] | ORAL_CAPSULE | ORAL | 0 refills | Status: DC

## 2019-02-06 NOTE — Progress Notes (Signed)
Office: [phone]  /  Fax: [phone]   HPI:   Chief Complaint: OBESITY Traiton is here to discuss his progress with his obesity treatment plan. He is on the Category 3 plan and is following his eating plan approximately 80 % of the time. He states he hiked 4 miles for 2-3 hours 1 time per week. Eric [name] has done more eating out, but also has increased activity while going deer hunting. He has questions about Thanksgiving eating strategies. He has tried to walk 3-4 times a week as his arthritis and weather permits.  His weight is 230 lb (104.3 kg) today and has gained 1 lb since his last visit. He has lost 58 lbs since starting treatment with [place].  Pre-Diabetes Eric [name] has a diagnosis of pre-diabetes based on his elevated Hgb A1c and was informed this puts him at greater risk of developing diabetes. He is taking metformin currently and is doing well overall with decreasing simple carbohydrate, but does give in occasionally. He continues to work on diet and exercise to decrease risk of diabetes. He denies nausea or hypoglycemia.  Vitamin D Deficiency Eric [name] has a diagnosis of vitamin D deficiency. He is at goal on prescription Vit D, no signs of over-replacement. He denies nausea, vomiting or muscle weakness.  ASSESSMENT AND PLAN:  Prediabetes - Plan: metFORMIN (GLUCOPHAGE) 500 MG tablet  Vitamin D deficiency - Plan: Vitamin D, Ergocalciferol, (DRISDOL) 1.25 MG (50000 UT) CAPS capsule  Class 2 severe obesity with serious comorbidity and body mass index (BMI) of 35.0 to 35.9 in adult, unspecified obesity type Eric [name])  PLAN:  Pre-Diabetes Eric [name] will continue to work on weight loss, exercise, and decreasing simple carbohydrates in his diet to help decrease the risk of diabetes. We dicussed metformin including benefits and risks. He was informed that eating too many simple carbohydrates or too many calories at one sitting increases the likelihood of GI side effects. Eric [name] agrees to  continue taking metformin 500 mg PO q AM #30 and we will refill for 1 month. Eric [name] agrees to follow up with our clinic in 3 to 4 weeks as directed to monitor his progress.  Vitamin D Deficiency Eric [name] was informed that low vitamin D levels contributes to fatigue and are associated with obesity, breast, and colon cancer. Eric [name] agrees to continue taking prescription Vit D 50,000 IU every week #4 and we will refill for 1 month. He will follow up for routine testing of vitamin D, at least 2-3 times per year. He was informed of the risk of over-replacement of vitamin D and agrees to not increase his dose unless he discusses this with [place] first. Eric [name] agrees to follow up with our clinic in 3 to 4 weeks.  Obesity Eric [name] is currently in the action stage of change. As such, his goal is to continue with weight loss efforts He has agreed to follow the Category 3 plan Eric [name] has been instructed to work up to a goal of 150 minutes of combined cardio and strengthening exercise per week for weight loss and overall health benefits. We discussed the following Behavioral Modification Strategies today: increasing lean protein intake and holiday eating strategies    Eric [name] has agreed to follow up with our clinic in 3 to 4 weeks. He was informed of the importance of frequent follow up visits to maximize his success with intensive lifestyle modifications for his multiple health conditions.  ALLERGIES: Allergies  Allergen Reactions  . Lisinopril Cough    MEDICATIONS: Current Outpatient Medications on File  Prior to Visit  Medication Sig Dispense Refill  . aspirin EC 81 MG tablet Take 1 tablet (81 mg total) by mouth 2 (two) times daily. (Patient taking differently: Take 81 mg by mouth once. ) 60 tablet 0  . atorvastatin (LIPITOR) 40 MG tablet TAKE 1 TABLET DAILY (Patient taking differently: Take 40 mg by mouth daily. ) 90 tablet 4  . carbamide peroxide (DEBROX) 6.5 % OTIC solution Place 5 drops into both  ears daily as needed (for ear wax).    . Coenzyme Q10 (COQ10) 200 MG CAPS Take 200 mg by mouth at bedtime.    . fluocinonide ointment (LIDEX) [id number] % Apply 1 application topically daily as needed (for bites). 30 g 3  . fluticasone (FLONASE) 50 MCG/ACT nasal spray Place 2 sprays into both nostrils daily as needed for allergies.     [name] loratadine (CLARITIN) 10 MG tablet Take 10 mg by mouth daily as needed for allergies.     . meloxicam (MOBIC) 15 MG tablet TAKE 1 TABLET DAILY 90 tablet 0  . pantoprazole (PROTONIX) 40 MG tablet Take 1 tablet (40 mg total) by mouth daily. 90 tablet 3  . tamsulosin (FLOMAX) 0.4 MG CAPS capsule TAKE 1 CAPSULE DAILY (Patient taking differently: Take 0.4 mg by mouth daily. ) 90 capsule 3  . valsartan (DIOVAN) 80 MG tablet Take 80 mg by mouth daily.    . Zinc 50 MG CAPS Take by mouth daily.     No current facility-administered medications on file prior to visit.     PAST MEDICAL HISTORY: Past Medical History:  Diagnosis Date  . Alcohol abuse   . Allergy   . Back pain   . Cataract    forming  . Colon polyps   . Diverticulosis of colon   . DJD (degenerative joint disease)   . Dysrhythmia    Palpations from caffiene- reports he was having PACs due to slim fast wth caffeine - had full cardiac w/u and was told to cut back on caffeine;  now drink decaf bevrerages and no rpeort of recurrence   . Enlarged prostate   . GERD (gastroesophageal reflux disease)   . Hearing loss   . Hip osteoarthritis    Left  . History of hay fever   . Hx of cardiovascular stress test    Lexiscan Myoview (04/2013):  No ischemia, EF 61%; low risk  . Hyperlipemia   . Hypertension   . Insulin resistance    om metfromin   . Internal hemorrhoids without mention of complication   . Leg edema   . Neuroma of foot    left  . Obesity   . Osteoarthrosis, unspecified whether generalized or localized, unspecified site   . Palpitations   . 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 Primary localized osteoarthrosis of right shoulder 05/25/2016  . Recent weight loss    75lbs  . Seasonal allergies   . Sleep apnea    CPAP  . Thyroid nodule    calcified thyroid nodules- bx negative   . Tremor of both hands     PAST SURGICAL HISTORY: Past Surgical History:  Procedure Laterality Date  . COLONOSCOPY    . POLYPECTOMY    . SHOULDER SURGERY  2009   right  . TONSILLECTOMY    . TOTAL HIP ARTHROPLASTY Left 03/28/2017   Procedure: TOTAL HIP ARTHROPLASTY ANTERIOR APPROACH;  Surgeon: Frederik Pear, MD;  Location: [facility];  Service: Orthopedics;  Laterality: Left;  . TOTAL  HIP ARTHROPLASTY Right 09/25/2018   Procedure: Right Anterior Hip Arthroplasty;  Surgeon: Frederik Pear, MD;  Location: [facility];  Service: Orthopedics;  Laterality: Right;  . TOTAL SHOULDER ARTHROPLASTY Left 10/14/2015   Procedure: LEFT TOTAL SHOULDER ARTHROPLASTY;  Surgeon: Marchia Bond, MD;  Location: [facility];  Service: Orthopedics;  Laterality: Left;  . TOTAL SHOULDER ARTHROPLASTY Right 05/25/2016   Procedure: TOTAL SHOULDER ARTHROPLASTY;  Surgeon: Marchia Bond, MD;  Location: [facility];  Service: Orthopedics;  Laterality: Right;    SOCIAL HISTORY: Social History   Tobacco Use  . Smoking status: Never Smoker  . Smokeless tobacco: Never Used  Substance Use Topics  . Alcohol use: Yes    Alcohol/week: 0.0 standard drinks    Comment: rare----12 a year  . Drug use: No    FAMILY HISTORY: Family History  Problem Relation Age of Onset  . Arthritis Mother   . Cancer Mother 65       breast  . Hyperlipidemia Mother   . Colon polyps Mother 58  . Hypertension Mother   . Heart disease Mother   . Depression Mother   . Anxiety disorder Mother   . Obesity Mother   . Breast cancer Mother   . Heart disease Father        CAD--passed away 06-22-2011 age 90  . Hyperlipidemia Father   . Hypertension Father   . Obesity Father   . Diabetes Father   . Depression Father   . Sleep apnea Father   . Cancer Sister 35        breast  . Breast cancer Sister   . Cancer Brother 69       prostate  . Prostate cancer Brother   . Breast cancer Other   . Prostate cancer Other   . Colon cancer Neg Hx   . Esophageal cancer Neg Hx   . Rectal cancer Neg Hx   . Stomach cancer Neg Hx     ROS: Review of Systems  Constitutional: Negative for weight loss.  Gastrointestinal: Negative for nausea and vomiting.  Musculoskeletal:       Negative muscle weakness  Endo/Heme/Allergies:       Negative hypoglycemia    PHYSICAL EXAM: Blood pressure 117/73, pulse 60, temperature 98 F (36.7 C), temperature source Oral, height 5\' 8"  (1.727 m), weight 230 lb (104.3 kg), SpO2 99 %. Body mass index is 34.97 kg/m. Physical Exam Vitals signs reviewed.  Constitutional:      Appearance: Normal appearance. He is obese.  Cardiovascular:     Rate and Rhythm: Normal rate.     Pulses: Normal pulses.  Pulmonary:     Effort: Pulmonary effort is normal.     Breath sounds: Normal breath sounds.  Musculoskeletal: Normal range of motion.  Skin:    General: Skin is warm and dry.  Neurological:     Mental Status: He is alert and oriented to person, place, and time.  Psychiatric:        Mood and Affect: Mood normal.        Behavior: Behavior normal.     RECENT LABS AND TESTS: BMET    Component Value Date/Time   NA 137 09/26/2018 0326   NA 141 12/14/2017 1030   K 4.3 09/26/2018 0326   CL 107 09/26/2018 0326   CO2 26 09/26/2018 0326   GLUCOSE 142 (H) 09/26/2018 0326   BUN 13 09/26/2018 0326   BUN 19 12/14/2017 1030   CREATININE 0.82 09/26/2018 0326   CREATININE 0.93  04/23/2016 1633   CALCIUM 8.6 (L) 09/26/2018 0326   GFRNONAA >60 09/26/2018 0326   GFRAA >60 09/26/2018 0326   Lab Results  Component Value Date   HGBA1C 5.6 08/29/2018   HGBA1C 5.4 03/09/2018   HGBA1C 5.7 (H) 12/14/2017   HGBA1C 5.6 03/08/2017   HGBA1C 5.5 11/18/2016   Lab Results  Component Value Date   INSULIN 21.3 12/14/2017   CBC    Component  Value Date/Time   WBC 12.8 (H) 09/26/2018 0326   RBC 4.38 09/26/2018 0326   HGB 13.4 09/26/2018 0326   HGB 15.5 12/14/2017 1030   HCT 41.8 09/26/2018 0326   HCT 45.1 12/14/2017 1030   PLT 168 09/26/2018 0326   MCV 95.4 09/26/2018 0326   MCV 90 12/14/2017 1030   MCH 30.6 09/26/2018 0326   MCHC 32.1 09/26/2018 0326   RDW 12.7 09/26/2018 0326   RDW 13.0 12/14/2017 1030   LYMPHSABS 1.4 09/21/2018 0817   LYMPHSABS 1.7 12/14/2017 1030   MONOABS 0.8 09/21/2018 0817   EOSABS 0.0 09/21/2018 0817   EOSABS 0.1 12/14/2017 1030   BASOSABS 0.0 09/21/2018 0817   BASOSABS 0.0 12/14/2017 1030   Iron/TIBC/Ferritin/ %Sat No results found for: IRON, TIBC, FERRITIN, IRONPCTSAT Lipid Panel     Component Value Date/Time   CHOL 120 08/29/2018 1017   CHOL 150 12/14/2017 1030   TRIG 50.0 08/29/2018 1017   HDL 39.70 08/29/2018 1017   HDL 52 12/14/2017 1030   CHOLHDL 3 08/29/2018 1017   VLDL 10.0 08/29/2018 1017   LDLCALC 70 08/29/2018 1017   LDLCALC 78 12/14/2017 1030   Hepatic Function Panel     Component Value Date/Time   PROT 6.0 08/29/2018 1017   PROT 6.5 12/14/2017 1030   ALBUMIN 4.1 08/29/2018 1017   ALBUMIN 4.5 12/14/2017 1030   AST 10 08/29/2018 1017   ALT 12 08/29/2018 1017   ALKPHOS 58 08/29/2018 1017   BILITOT 0.4 08/29/2018 1017   BILITOT 0.6 12/14/2017 1030   BILIDIR 0.2 06/07/2014 0909      Component Value Date/Time   TSH 1.060 12/14/2017 1030   TSH 0.62 10/03/2015 1230   TSH 0.51 10/08/2010 0859      OBESITY BEHAVIORAL INTERVENTION VISIT  Today's visit was # 22   Starting weight: 288 lbs Starting date: 12/14/17 Today's weight : 230 lbs Today's date: 02/05/2019 Total lbs lost to date: 36 At least 15 minutes were spent on discussing the following behavioral intervention visit.   ASK: We discussed the diagnosis of obesity with Eric [name] today and Eric [name] agreed to give [place] permission to discuss obesity behavioral modification therapy today.  ASSESS:  Eric [name] has the diagnosis of obesity and his BMI today is 34.98 Eric [name] is in the action stage of change   ADVISE: Eric [name] was educated on the multiple health risks of obesity as well as the benefit of weight loss to improve his health. He was advised of the need for long term treatment and the importance of lifestyle modifications to improve his current health and to decrease his risk of future health problems.  AGREE: Multiple dietary modification options and treatment options were discussed and  Eric [name] agreed to follow the recommendations documented in the above note.  ARRANGE: [place] was educated on the importance of frequent visits to treat obesity as outlined per CMS and USPSTF guidelines and agreed to schedule his next follow up appointment today.  Wilhemena Durie, am acting as transcriptionist for [name], MD  I have reviewed  the above documentation for accuracy and completeness, and I agree with the above. -[name], MD

## 2019-02-13 DIAGNOSIS — X32XXXS Exposure to sunlight, sequela: Secondary | ICD-10-CM | POA: Diagnosis not present

## 2019-02-13 DIAGNOSIS — D2272 Melanocytic nevi of left lower limb, including hip: Secondary | ICD-10-CM | POA: Diagnosis not present

## 2019-02-13 DIAGNOSIS — D1801 Hemangioma of skin and subcutaneous tissue: Secondary | ICD-10-CM | POA: Diagnosis not present

## 2019-02-13 DIAGNOSIS — L918 Other hypertrophic disorders of the skin: Secondary | ICD-10-CM | POA: Diagnosis not present

## 2019-02-13 DIAGNOSIS — D225 Melanocytic nevi of trunk: Secondary | ICD-10-CM | POA: Diagnosis not present

## 2019-02-13 DIAGNOSIS — L738 Other specified follicular disorders: Secondary | ICD-10-CM | POA: Diagnosis not present

## 2019-02-13 DIAGNOSIS — D2271 Melanocytic nevi of right lower limb, including hip: Secondary | ICD-10-CM | POA: Diagnosis not present

## 2019-02-13 DIAGNOSIS — L814 Other melanin hyperpigmentation: Secondary | ICD-10-CM | POA: Diagnosis not present

## 2019-02-14 ENCOUNTER — Other Ambulatory Visit: Payer: Self-pay

## 2019-02-26 ENCOUNTER — Other Ambulatory Visit: Payer: Self-pay

## 2019-02-26 ENCOUNTER — Ambulatory Visit (INDEPENDENT_AMBULATORY_CARE_PROVIDER_SITE_OTHER): Payer: Medicare Other | Admitting: Family Medicine

## 2019-02-26 ENCOUNTER — Encounter (INDEPENDENT_AMBULATORY_CARE_PROVIDER_SITE_OTHER): Payer: Self-pay | Admitting: Family Medicine

## 2019-02-26 VITALS — BP 108/65 | HR 51 | Temp 98.1°F | Ht 68.0 in | Wt 228.0 lb

## 2019-02-26 DIAGNOSIS — Z6834 Body mass index (BMI) 34.0-34.9, adult: Secondary | ICD-10-CM

## 2019-02-26 DIAGNOSIS — R7303 Prediabetes: Secondary | ICD-10-CM

## 2019-02-26 DIAGNOSIS — E669 Obesity, unspecified: Secondary | ICD-10-CM | POA: Diagnosis not present

## 2019-02-26 DIAGNOSIS — E559 Vitamin D deficiency, unspecified: Secondary | ICD-10-CM | POA: Diagnosis not present

## 2019-02-26 MED ORDER — METFORMIN HCL 500 MG PO TABS: 500.0000 mg | ORAL_TABLET | Freq: Every day | ORAL | 0 refills | Status: DC

## 2019-02-26 MED ORDER — VITAMIN D (ERGOCALCIFEROL) 1.25 MG (50000 UNIT) PO CAPS: 50000.0000 [IU] | ORAL_CAPSULE | ORAL | 0 refills | Status: DC

## 2019-02-27 NOTE — Progress Notes (Signed)
Office: [phone]  /  Fax: [phone]   HPI:   Chief Complaint: OBESITY Eric [name] is here to discuss his progress with his obesity treatment plan. He is on the Category 3 plan and is following his eating plan approximately 85 % of the time. He states he is doing yard work for 30 minutes 3 times per week. Eric [name] continues to do well with his Category 3 plan. His hunger is controlled and he did well with portion control even over Thanksgiving.  His weight is 228 lb (103.4 kg) today and has had a weight loss of 2 pounds over a period of 3 weeks since his last visit. He has lost 60 lbs since starting treatment with [place].  Pre-Diabetes Eric [name] has a diagnosis of pre-diabetes based on his elevated Hgb A1c and was informed this puts him at greater risk of developing diabetes. He is working on diet and exercise, and he is tolerating metformin well.  Vitamin D Deficiency Eric [name] has a diagnosis of vitamin D deficiency. He is stable on prescription Vit D, and he requests a refill today.  ASSESSMENT AND PLAN:  Prediabetes - Plan: metFORMIN (GLUCOPHAGE) 500 MG tablet  Vitamin D deficiency - Plan: Vitamin D, Ergocalciferol, (DRISDOL) 1.25 MG (50000 UT) CAPS capsule  Class 1 obesity with serious comorbidity and body mass index (BMI) of 34.0 to 34.9 in adult, unspecified obesity type  PLAN:  Pre-Diabetes Eric [name] will continue to work on weight loss, exercise, and decreasing simple carbohydrates to help decrease the risk of diabetes. Eric [name] agrees to continue taking metformin 500 mg PO q AM #30 and we will refill for 1 month. He is to have labs done with his primary care physician this week. Eric [name] agrees to follow up with our clinic in 3 weeks.  Vitamin D Deficiency Low vitamin D level contributes to fatigue and are associated with obesity, breast, and colon cancer. Eric [name] agrees to continue taking prescription Vit D 50,000 IU every week #4 and we will refill for 1 month. He will follow up  for routine testing of vitamin D, at least 2-3 times per year to avoid over-replacement. He is to have labs done with his primary care physician this week. Eric [name] agrees to follow up with our clinic in 3 weeks.  Obesity Eric [name] is currently in the action stage of change. As such, his goal is to continue with weight loss efforts He has agreed to follow the Category 3 plan Eric [name] has been instructed to work up to a goal of 150 minutes of combined cardio and strengthening exercise per week for weight loss and overall health benefits. We discussed the following Behavioral Modification Strategies today: holiday eating strategies    Eric [name] has agreed to follow up with our clinic in 3 weeks. He was informed of the importance of frequent follow up visits to maximize his success with intensive lifestyle modifications for his multiple health conditions.  ALLERGIES: Allergies  Allergen Reactions  . Lisinopril Cough    MEDICATIONS: Current Outpatient Medications on File Prior to Visit  Medication Sig Dispense Refill  . aspirin EC 81 MG tablet Take 1 tablet (81 mg total) by mouth 2 (two) times daily. (Patient taking differently: Take 81 mg by mouth once. ) 60 tablet 0  . atorvastatin (LIPITOR) 40 MG tablet TAKE 1 TABLET DAILY (Patient taking differently: Take 40 mg by mouth daily. ) 90 tablet 4  . carbamide peroxide (DEBROX) 6.5 % OTIC solution Place 5 drops into both ears daily as needed (for ear  wax).    . Coenzyme Q10 (COQ10) 200 MG CAPS Take 200 mg by mouth at bedtime.    . fluocinonide ointment (LIDEX) AB-[id number] % Apply 1 application topically daily as needed (for bites). 30 g 3  . fluticasone (FLONASE) 50 MCG/ACT nasal spray Place 2 sprays into both nostrils daily as needed for allergies.     [name] loratadine (CLARITIN) 10 MG tablet Take 10 mg by mouth daily as needed for allergies.     . meloxicam (MOBIC) 15 MG tablet TAKE 1 TABLET DAILY 90 tablet 0  . pantoprazole (PROTONIX) 40 MG tablet Take 1  tablet (40 mg total) by mouth daily. 90 tablet 3  . tamsulosin (FLOMAX) 0.4 MG CAPS capsule TAKE 1 CAPSULE DAILY (Patient taking differently: Take 0.4 mg by mouth daily. ) 90 capsule 3  . valsartan (DIOVAN) 80 MG tablet Take 80 mg by mouth daily.    . Zinc 50 MG CAPS Take by mouth daily.     No current facility-administered medications on file prior to visit.     PAST MEDICAL HISTORY: Past Medical History:  Diagnosis Date  . Alcohol abuse   . Allergy   . Back pain   . Cataract    forming  . Colon polyps   . Diverticulosis of colon   . DJD (degenerative joint disease)   . Dysrhythmia    Palpations from caffiene- reports he was having PACs due to slim fast wth caffeine - had full cardiac w/u and was told to cut back on caffeine;  now drink decaf bevrerages and no rpeort of recurrence   . Enlarged prostate   . GERD (gastroesophageal reflux disease)   . Hearing loss   . Hip osteoarthritis    Left  . History of hay fever   . Hx of cardiovascular stress test    Lexiscan Myoview (04/2013):  No ischemia, EF 61%; low risk  . Hyperlipemia   . Hypertension   . Insulin resistance    om metfromin   . Internal hemorrhoids without mention of complication   . Leg edema   . Neuroma of foot    left  . Obesity   . Osteoarthrosis, unspecified whether generalized or localized, unspecified site   . Palpitations   . 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 Primary localized osteoarthrosis of right shoulder 05/25/2016  . Recent weight loss    75lbs  . Seasonal allergies   . Sleep apnea    CPAP  . Thyroid nodule    calcified thyroid nodules- bx negative   . Tremor of both hands     PAST SURGICAL HISTORY: Past Surgical History:  Procedure Laterality Date  . COLONOSCOPY    . POLYPECTOMY    . SHOULDER SURGERY  2009   right  . TONSILLECTOMY    . TOTAL HIP ARTHROPLASTY Left 03/28/2017   Procedure: TOTAL HIP ARTHROPLASTY ANTERIOR APPROACH;  Surgeon: Frederik Pear, MD;  Location:  [place];  Service: Orthopedics;  Laterality: Left;  . TOTAL HIP ARTHROPLASTY Right 09/25/2018   Procedure: Right Anterior Hip Arthroplasty;  Surgeon: Frederik Pear, MD;  Location: [facility];  Service: Orthopedics;  Laterality: Right;  . TOTAL SHOULDER ARTHROPLASTY Left 10/14/2015   Procedure: LEFT TOTAL SHOULDER ARTHROPLASTY;  Surgeon: Marchia Bond, MD;  Location: [place];  Service: Orthopedics;  Laterality: Left;  . TOTAL SHOULDER ARTHROPLASTY Right 05/25/2016   Procedure: TOTAL SHOULDER ARTHROPLASTY;  Surgeon: Marchia Bond, MD;  Location: [place];  Service: Orthopedics;  Laterality: Right;  SOCIAL HISTORY: Social History   Tobacco Use  . Smoking status: Never Smoker  . Smokeless tobacco: Never Used  Substance Use Topics  . Alcohol use: Yes    Alcohol/week: 0.0 standard drinks    Comment: rare----12 a year  . Drug use: No    FAMILY HISTORY: Family History  Problem Relation Age of Onset  . Arthritis Mother   . Cancer Mother 73       breast  . Hyperlipidemia Mother   . Colon polyps Mother 15  . Hypertension Mother   . Heart disease Mother   . Depression Mother   . Anxiety disorder Mother   . Obesity Mother   . Breast cancer Mother   . Heart disease Father        CAD--passed away 06-21-11 age 38  . Hyperlipidemia Father   . Hypertension Father   . Obesity Father   . Diabetes Father   . Depression Father   . Sleep apnea Father   . Cancer Sister 54       breast  . Breast cancer Sister   . Cancer Brother 58       prostate  . Prostate cancer Brother   . Breast cancer Other   . Prostate cancer Other   . Colon cancer Neg Hx   . Esophageal cancer Neg Hx   . Rectal cancer Neg Hx   . Stomach cancer Neg Hx     ROS: Review of Systems  Constitutional: Positive for weight loss.    PHYSICAL EXAM: Blood pressure 108/65, pulse (!) 51, temperature 98.1 F (36.7 C), temperature source Oral, height 5\' 8"  (1.727 m), weight 228 lb (103.4 kg), SpO2 96 %. Body mass index is 34.67 kg/m.  Physical Exam Vitals signs reviewed.  Constitutional:      Appearance: Normal appearance. He is obese.  Cardiovascular:     Rate and Rhythm: Normal rate.     Pulses: Normal pulses.  Pulmonary:     Effort: Pulmonary effort is normal.     Breath sounds: Normal breath sounds.  Musculoskeletal: Normal range of motion.  Skin:    General: Skin is warm and dry.  Neurological:     Mental Status: He is alert and oriented to person, place, and time.  Psychiatric:        Mood and Affect: Mood normal.        Behavior: Behavior normal.     RECENT LABS AND TESTS: BMET    Component Value Date/Time   NA 137 09/26/2018 0326   NA 141 12/14/2017 1030   K 4.3 09/26/2018 0326   CL 107 09/26/2018 0326   CO2 26 09/26/2018 0326   GLUCOSE 142 (H) 09/26/2018 0326   BUN 13 09/26/2018 0326   BUN 19 12/14/2017 1030   CREATININE 0.82 09/26/2018 0326   CREATININE 0.93 04/23/2016 1633   CALCIUM 8.6 (L) 09/26/2018 0326   GFRNONAA >60 09/26/2018 0326   GFRAA >60 09/26/2018 0326   Lab Results  Component Value Date   HGBA1C 5.6 08/29/2018   HGBA1C 5.4 03/09/2018   HGBA1C 5.7 (H) 12/14/2017   HGBA1C 5.6 03/08/2017   HGBA1C 5.5 11/18/2016   Lab Results  Component Value Date   INSULIN 21.3 12/14/2017   CBC    Component Value Date/Time   WBC 12.8 (H) 09/26/2018 0326   RBC 4.38 09/26/2018 0326   HGB 13.4 09/26/2018 0326   HGB 15.5 12/14/2017 1030   HCT 41.8 09/26/2018 0326   HCT 45.1 12/14/2017  1030   PLT 168 09/26/2018 0326   MCV 95.4 09/26/2018 0326   MCV 90 12/14/2017 1030   MCH 30.6 09/26/2018 0326   MCHC 32.1 09/26/2018 0326   RDW 12.7 09/26/2018 0326   RDW 13.0 12/14/2017 1030   LYMPHSABS 1.4 09/21/2018 0817   LYMPHSABS 1.7 12/14/2017 1030   MONOABS 0.8 09/21/2018 0817   EOSABS 0.0 09/21/2018 0817   EOSABS 0.1 12/14/2017 1030   BASOSABS 0.0 09/21/2018 0817   BASOSABS 0.0 12/14/2017 1030   Iron/TIBC/Ferritin/ %Sat No results found for: IRON, TIBC, FERRITIN, IRONPCTSAT Lipid  Panel     Component Value Date/Time   CHOL 120 08/29/2018 1017   CHOL 150 12/14/2017 1030   TRIG 50.0 08/29/2018 1017   HDL 39.70 08/29/2018 1017   HDL 52 12/14/2017 1030   CHOLHDL 3 08/29/2018 1017   VLDL 10.0 08/29/2018 1017   LDLCALC 70 08/29/2018 1017   LDLCALC 78 12/14/2017 1030   Hepatic Function Panel     Component Value Date/Time   PROT 6.0 08/29/2018 1017   PROT 6.5 12/14/2017 1030   ALBUMIN 4.1 08/29/2018 1017   ALBUMIN 4.5 12/14/2017 1030   AST 10 08/29/2018 1017   ALT 12 08/29/2018 1017   ALKPHOS 58 08/29/2018 1017   BILITOT 0.4 08/29/2018 1017   BILITOT 0.6 12/14/2017 1030   BILIDIR 0.2 06/07/2014 0909      Component Value Date/Time   TSH 1.060 12/14/2017 1030   TSH 0.62 10/03/2015 1230   TSH 0.51 10/08/2010 0859      OBESITY BEHAVIORAL INTERVENTION VISIT  Today's visit was # 23   Starting weight: 288 lbs Starting date: 12/14/17 Today's weight : 228 lbs Today's date: 02/26/2019 Total lbs lost to date: 60 At least 15 minutes were spent on discussing the following behavioral intervention visit.   ASK: We discussed the diagnosis of obesity with Eric [name] today and Eric [name] agreed to give [place] permission to discuss obesity behavioral modification therapy today.  ASSESS: Eric [name] has the diagnosis of obesity and his BMI today is 34.68 Eric [name] is in the action stage of change   ADVISE: Eric [name] was educated on the multiple health risks of obesity as well as the benefit of weight loss to improve his health. He was advised of the need for long term treatment and the importance of lifestyle modifications to improve his current health and to decrease his risk of future health problems.  AGREE: Multiple dietary modification options and treatment options were discussed and  Eric [name] agreed to follow the recommendations documented in the above note.  ARRANGE: Eric [name] was educated on the importance of frequent visits to treat obesity as outlined per CMS  and USPSTF guidelines and agreed to schedule his next follow up appointment today.  I, Trixie Dredge, am acting as transcriptionist for [name], MD  I have reviewed the above documentation for accuracy and completeness, and I agree with the above. -[name], MD

## 2019-03-01 ENCOUNTER — Other Ambulatory Visit: Payer: Self-pay

## 2019-03-02 ENCOUNTER — Ambulatory Visit (INDEPENDENT_AMBULATORY_CARE_PROVIDER_SITE_OTHER): Payer: Medicare Other | Admitting: Family Medicine

## 2019-03-02 ENCOUNTER — Other Ambulatory Visit: Payer: Self-pay

## 2019-03-02 ENCOUNTER — Encounter: Payer: Self-pay | Admitting: Family Medicine

## 2019-03-02 VITALS — BP 108/60 | HR 53 | Temp 97.1°F | Resp 18 | Ht 68.0 in | Wt 236.2 lb

## 2019-03-02 DIAGNOSIS — E6609 Other obesity due to excess calories: Secondary | ICD-10-CM | POA: Diagnosis not present

## 2019-03-02 DIAGNOSIS — N400 Enlarged prostate without lower urinary tract symptoms: Secondary | ICD-10-CM | POA: Diagnosis not present

## 2019-03-02 DIAGNOSIS — Z0000 Encounter for general adult medical examination without abnormal findings: Secondary | ICD-10-CM | POA: Insufficient documentation

## 2019-03-02 DIAGNOSIS — R7303 Prediabetes: Secondary | ICD-10-CM

## 2019-03-02 DIAGNOSIS — E785 Hyperlipidemia, unspecified: Secondary | ICD-10-CM | POA: Diagnosis not present

## 2019-03-02 DIAGNOSIS — E559 Vitamin D deficiency, unspecified: Secondary | ICD-10-CM

## 2019-03-02 DIAGNOSIS — L739 Follicular disorder, unspecified: Secondary | ICD-10-CM

## 2019-03-02 DIAGNOSIS — I10 Essential (primary) hypertension: Secondary | ICD-10-CM

## 2019-03-02 DIAGNOSIS — Z6834 Body mass index (BMI) 34.0-34.9, adult: Secondary | ICD-10-CM

## 2019-03-02 DIAGNOSIS — E669 Obesity, unspecified: Secondary | ICD-10-CM | POA: Insufficient documentation

## 2019-03-02 DIAGNOSIS — R350 Frequency of micturition: Secondary | ICD-10-CM

## 2019-03-02 LAB — COMPREHENSIVE METABOLIC PANEL
ALT: 12 U/L (ref 0–53)
AST: 10 U/L (ref 0–37)
Albumin: 4.4 g/dL (ref 3.5–5.2)
Alkaline Phosphatase: 64 U/L (ref 39–117)
BUN: 23 mg/dL (ref 6–23)
CO2: 29 mEq/L (ref 19–32)
Calcium: 9.3 mg/dL (ref 8.4–10.5)
Chloride: 103 mEq/L (ref 96–112)
Creatinine, Ser: 0.97 mg/dL (ref 0.40–1.50)
GFR: 75.9 mL/min (ref >60.00)
Glucose, Bld: 94 mg/dL (ref 70–99)
Potassium: 4.2 mEq/L (ref 3.5–5.1)
Sodium: 139 mEq/L (ref 135–145)
Total Bilirubin: 0.8 mg/dL (ref 0.2–1.2)
Total Protein: 6.5 g/dL (ref 6.0–8.3)

## 2019-03-02 LAB — LIPID PANEL
Cholesterol: 136 mg/dL (ref 0–200)
HDL: 44.3 mg/dL (ref >39.00)
LDL Cholesterol: 78 mg/dL (ref 0–99)
NonHDL: 91.82
Total CHOL/HDL Ratio: 3
Triglycerides: 68 mg/dL (ref 0.0–149.0)
VLDL: 13.6 mg/dL (ref 0.0–40.0)

## 2019-03-02 LAB — POC URINALSYSI DIPSTICK (AUTOMATED)
Bilirubin, UA: NEGATIVE
Blood, UA: NEGATIVE
Glucose, UA: NEGATIVE
Ketones, UA: NEGATIVE
Leukocytes, UA: NEGATIVE
Nitrite, UA: NEGATIVE
Protein, UA: NEGATIVE
Spec Grav, UA: 1.015 (ref 1.010–1.025)
Urobilinogen, UA: 0.2 E.U./dL
pH, UA: 6 (ref 5.0–8.0)

## 2019-03-02 LAB — PSA: PSA: 1.09 ng/mL (ref 0.10–4.00)

## 2019-03-02 LAB — HEMOGLOBIN A1C: Hgb A1c MFr Bld: 5.5 % (ref 4.6–6.5)

## 2019-03-02 LAB — VITAMIN D 25 HYDROXY (VIT D DEFICIENCY, FRACTURES): VITD: 52.56 ng/mL (ref 30.00–100.00)

## 2019-03-02 MED ORDER — CLINDAMYCIN PHOSPHATE 1 % EX LOTN: TOPICAL_LOTION | Freq: Two times a day (BID) | CUTANEOUS | 0 refills | Status: DC

## 2019-03-02 NOTE — Assessment & Plan Note (Signed)
Tolerating statin, encouraged heart healthy diet, avoid trans fats, minimize simple carbs and saturated fats. Increase exercise as tolerated 

## 2019-03-02 NOTE — Assessment & Plan Note (Signed)
Well controlled, no changes to meds. Encouraged heart healthy diet such as the DASH diet and exercise as tolerated.  °

## 2019-03-02 NOTE — Assessment & Plan Note (Signed)
[facility] 

## 2019-03-02 NOTE — Patient Instructions (Signed)

## 2019-03-02 NOTE — Progress Notes (Addendum)
Patient ID: Eric [name], male    DOB: 03/07/1946  Age: 73 y.o. MRN: [id number]    Subjective:  Subjective  HPI Eric [name] presents for f/u bp , weight and cholesterol     No compliants   Review of Systems  Constitutional: Negative.  Negative for chills and fever.  HENT: Negative for congestion, ear pain, hearing loss, nosebleeds, postnasal drip, rhinorrhea, sinus pressure, sneezing and tinnitus.   Eyes: Negative for photophobia, discharge, itching and visual disturbance.  Respiratory: Negative.  Negative for cough and shortness of breath.   Cardiovascular: Negative.  Negative for chest pain, palpitations and leg swelling.  Gastrointestinal: Negative for abdominal distention, abdominal pain, anal bleeding, blood in stool, constipation, diarrhea, nausea and vomiting.  Endocrine: Negative.   Genitourinary: Negative.  Negative for dysuria, frequency, hematuria and urgency.  Musculoskeletal: Negative.  Negative for back pain and myalgias.  Skin: Negative.  Negative for rash.  Allergic/Immunologic: Negative.  Negative for environmental allergies.  Neurological: Negative for dizziness, weakness, light-headedness, numbness and headaches.  Hematological: Does not bruise/bleed easily.  Psychiatric/Behavioral: Negative for agitation, confusion, decreased concentration, dysphoric mood, sleep disturbance and suicidal ideas. The patient is not nervous/anxious.     History Past Medical History:  Diagnosis Date  . Alcohol abuse   . Allergy   . Back pain   . Cataract    forming  . Colon polyps   . Diverticulosis of colon   . DJD (degenerative joint disease)   . Dysrhythmia    Palpations from caffiene- reports he was having PACs due to slim fast wth caffeine - had full cardiac w/u and was told to cut back on caffeine;  now drink decaf bevrerages and no rpeort of recurrence   . Enlarged prostate   . GERD (gastroesophageal reflux disease)   . Hearing loss   . Hip osteoarthritis    Left  . History of hay fever   . Hx of cardiovascular stress test    Lexiscan Myoview (04/2013):  No ischemia, EF 61%; low risk  . Hyperlipemia   . Hypertension   . Insulin resistance    om metfromin   . Internal hemorrhoids without mention of complication   . Leg edema   . Neuroma of foot    left  . Obesity   . Osteoarthrosis, unspecified whether generalized or localized, unspecified site   . Palpitations   . 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 Primary localized osteoarthrosis of right shoulder 05/25/2016  . Recent weight loss    75lbs  . Seasonal allergies   . Sleep apnea    CPAP  . Thyroid nodule    calcified thyroid nodules- bx negative   . Tremor of both hands     He has a past surgical history that includes Shoulder surgery (2009); Tonsillectomy; Colonoscopy; Total shoulder arthroplasty (Left, 10/14/2015); Total shoulder arthroplasty (Right, 05/25/2016); Total hip arthroplasty (Left, 03/28/2017); Polypectomy; and Total hip arthroplasty (Right, 09/25/2018).   His family history includes Anxiety disorder in his mother; Arthritis in his mother; Breast cancer in his mother, sister, and another family member; Cancer (age of onset: 57) in his sister; Cancer (age of onset: 38) in his mother; Cancer (age of onset: 72) in his brother; Colon polyps (age of onset: 7) in his mother; Depression in his father and mother; Diabetes in his father; Heart disease in his father and mother; Hyperlipidemia in his father and mother; Hypertension in his father and mother; Obesity in his father and mother; Prostate  cancer in his brother and another family member; Sleep apnea in his father.He reports that he has never smoked. He has never used smokeless tobacco. He reports current alcohol use. He reports that he does not use drugs.  Current Outpatient Medications on File Prior to Visit  Medication Sig Dispense Refill  . aspirin EC 81 MG tablet Take 1 tablet (81 mg total) by mouth 2 (two) times  daily. (Patient taking differently: Take 81 mg by mouth once. ) 60 tablet 0  . atorvastatin (LIPITOR) 40 MG tablet TAKE 1 TABLET DAILY (Patient taking differently: Take 40 mg by mouth daily. ) 90 tablet 4  . carbamide peroxide (DEBROX) 6.5 % OTIC solution Place 5 drops into both ears daily as needed (for ear wax).    . Coenzyme Q10 (COQ10) 200 MG CAPS Take 200 mg by mouth at bedtime.    . fluocinonide ointment (LIDEX) [id number] % Apply 1 application topically daily as needed (for bites). 30 g 3  . fluticasone (FLONASE) 50 MCG/ACT nasal spray Place 2 sprays into both nostrils daily as needed for allergies.     [name] loratadine (CLARITIN) 10 MG tablet Take 10 mg by mouth daily as needed for allergies.     . meloxicam (MOBIC) 15 MG tablet TAKE 1 TABLET DAILY 90 tablet 0  . metFORMIN (GLUCOPHAGE) 500 MG tablet Take 1 tablet (500 mg total) by mouth daily with breakfast. 30 tablet 0  . pantoprazole (PROTONIX) 40 MG tablet Take 1 tablet (40 mg total) by mouth daily. 90 tablet 3  . tamsulosin (FLOMAX) 0.4 MG CAPS capsule TAKE 1 CAPSULE DAILY (Patient taking differently: Take 0.4 mg by mouth daily. ) 90 capsule 3  . valsartan (DIOVAN) 80 MG tablet Take 80 mg by mouth daily.    . Vitamin D, Ergocalciferol, (DRISDOL) 1.25 MG (50000 UT) CAPS capsule Take 1 capsule (50,000 Units total) by mouth every 7 (seven) days. 4 capsule 0  . Zinc 50 MG CAPS Take by mouth daily.     No current facility-administered medications on file prior to visit.     Objective:  Objective  Physical Exam Vitals and nursing note reviewed.  Constitutional:      General: He is not in acute distress.    Appearance: He is well-developed. He is not diaphoretic.  HENT:     Head: Normocephalic and atraumatic.     Right Ear: External ear normal.     Left Ear: External ear normal.     Nose: Nose normal.     Mouth/Throat:     Pharynx: No oropharyngeal exudate.  Eyes:     General:        Right eye: No discharge.        Left eye: No  discharge.     Conjunctiva/sclera: Conjunctivae normal.     Pupils: Pupils are equal, round, and reactive to light.  Neck:     Thyroid: No thyromegaly.     Vascular: No JVD.  Cardiovascular:     Rate and Rhythm: Normal rate and regular rhythm.     Heart sounds: No murmur. No friction rub. No gallop.   Pulmonary:     Effort: Pulmonary effort is normal. No respiratory distress.     Breath sounds: Normal breath sounds. No wheezing or rales.  Chest:     Chest wall: No tenderness.  Abdominal:     General: Bowel sounds are normal. There is no distension.     Palpations: Abdomen is soft. There is no mass.  Tenderness: There is no abdominal tenderness. There is no guarding or rebound.  Genitourinary:    Penis: Normal.      Prostate: Normal.     Rectum: Normal. Guaiac result negative.  Musculoskeletal:        General: No tenderness. Normal range of motion.     Cervical back: Normal range of motion and neck supple.  Lymphadenopathy:     Cervical: No cervical adenopathy.  Skin:    General: Skin is warm and dry.     Coloration: Skin is not pale.     Findings: No erythema or rash.  Neurological:     Mental Status: He is alert and oriented to person, place, and time.     Motor: No abnormal muscle tone.     Deep Tendon Reflexes: Reflexes are normal and symmetric. Reflexes normal.  Psychiatric:        Behavior: Behavior normal.        Thought Content: Thought content normal.        Judgment: Judgment normal.    BP 108/60 (BP Location: Right Arm, Patient Position: Sitting, Cuff Size: Normal)   Pulse (!) 53   Temp (!) 97.1 F (36.2 C) (Temporal)   Resp 18   Ht 5\' 8"  (1.727 m)   Wt 236 lb 3.2 oz (107.1 kg)   SpO2 98%   BMI 35.91 kg/m  Wt Readings from Last 3 Encounters:  03/02/19 236 lb 3.2 oz (107.1 kg)  02/26/19 228 lb (103.4 kg)  02/05/19 230 lb (104.3 kg)     Lab Results  Component Value Date   WBC 12.8 (H) 09/26/2018   HGB 13.4 09/26/2018   HCT 41.8 09/26/2018    PLT 168 09/26/2018   GLUCOSE 94 03/02/2019   CHOL 136 03/02/2019   TRIG 68.0 03/02/2019   HDL 44.30 03/02/2019   LDLCALC 78 03/02/2019   ALT 12 03/02/2019   AST 10 03/02/2019   NA 139 03/02/2019   K 4.2 03/02/2019   CL 103 03/02/2019   CREATININE 0.97 03/02/2019   BUN 23 03/02/2019   CO2 29 03/02/2019   TSH 1.060 12/14/2017   PSA 1.09 03/02/2019   INR 1.0 09/21/2018   HGBA1C 5.5 03/02/2019   MICROALBUR 1.5 12/25/2012    DG Chest 2 View  Result Date: 09/21/2018 CLINICAL DATA:  Preoperative EXAM: CHEST - 2 VIEW COMPARISON:  03/16/2017 FINDINGS: The heart size and mediastinal contours are within normal limits. Both lungs are clear. The visualized skeletal structures are unremarkable. IMPRESSION: No acute abnormality of the lungs. Electronically Signed   By: Eddie Candle M.D.   On: 09/21/2018 11:38     Assessment & Plan:  Plan  I am having Eric [name] "Ronalee Belts" start on clindamycin. I am also having him maintain his loratadine, CoQ10, atorvastatin, fluticasone, tamsulosin, pantoprazole, fluocinonide ointment, carbamide peroxide, aspirin EC, valsartan, meloxicam, Zinc, metFORMIN, and Vitamin D (Ergocalciferol).  Meds ordered this encounter  Medications  . clindamycin (CLEOCIN-T) 1 % lotion    Sig: Apply topically 2 (two) times daily.    Dispense:  60 mL    Refill:  0    Problem List Items Addressed This Visit      Unprioritized   Class 1 obesity with serious comorbidity and body mass index (BMI) of 34.0 to 34.9 in adult    con't healthy weight and wellness      Essential hypertension - Primary    Well controlled, no changes to meds. Encouraged heart healthy diet such as  the DASH diet and exercise as tolerated.       Relevant Orders   Comprehensive metabolic panel (Completed)   Hyperlipidemia    Tolerating statin, encouraged heart healthy diet, avoid trans fats, minimize simple carbs and saturated fats. Increase exercise as tolerated      Relevant Orders   Lipid  panel (Completed)   Comprehensive metabolic panel (Completed)   Hyperlipidemia LDL goal <100    Tolerating statin, encouraged heart healthy diet, avoid trans fats, minimize simple carbs and saturated fats. Increase exercise as tolerated      Morbid obesity (HCC)   Relevant Orders   Hemoglobin A1c (Completed)   Insulin, random   Prediabetes   Relevant Orders   Hemoglobin A1c (Completed)   Insulin, random   Comprehensive metabolic panel (Completed)   Preventative health care    See AVS Check labs  ghm utd      Urinary frequency   Relevant Orders   POCT Urinalysis Dipstick (Automated) (Completed)   Vitamin D deficiency   Relevant Orders   Vitamin D (25 hydroxy) (Completed)    Other Visit Diagnoses    BPH without obstruction/lower urinary tract symptoms       Relevant Orders   PSA (Completed)   POCT Urinalysis Dipstick (Automated) (Completed)   Folliculitis       Relevant Medications   clindamycin (CLEOCIN-T) 1 % lotion      Follow-up: Return in about 1 year (around 03/01/2020), or if symptoms worsen or fail to improve, for annual exam, fasting.  [name], DO

## 2019-03-02 NOTE — Assessment & Plan Note (Signed)
See AVS Check labs  ghm utd

## 2019-03-05 ENCOUNTER — Encounter: Payer: Self-pay | Admitting: Family Medicine

## 2019-03-05 LAB — INSULIN, RANDOM: Insulin: 13.4 u[IU]/mL

## 2019-03-05 NOTE — Addendum Note (Signed)
Addended by: Roma Schanz R on: 03/05/2019 12:23 PM   Modules accepted: Level of Service

## 2019-03-18 ENCOUNTER — Other Ambulatory Visit: Payer: Self-pay | Admitting: Family Medicine

## 2019-04-02 ENCOUNTER — Ambulatory Visit (INDEPENDENT_AMBULATORY_CARE_PROVIDER_SITE_OTHER): Payer: Medicare Other | Admitting: Family Medicine

## 2019-04-02 ENCOUNTER — Encounter (INDEPENDENT_AMBULATORY_CARE_PROVIDER_SITE_OTHER): Payer: Self-pay | Admitting: Family Medicine

## 2019-04-02 ENCOUNTER — Other Ambulatory Visit: Payer: Self-pay

## 2019-04-02 VITALS — BP 103/68 | HR 43 | Temp 98.2°F | Ht 68.0 in | Wt 228.0 lb

## 2019-04-02 DIAGNOSIS — E559 Vitamin D deficiency, unspecified: Secondary | ICD-10-CM

## 2019-04-02 DIAGNOSIS — E6609 Other obesity due to excess calories: Secondary | ICD-10-CM | POA: Diagnosis not present

## 2019-04-02 DIAGNOSIS — Z6834 Body mass index (BMI) 34.0-34.9, adult: Secondary | ICD-10-CM | POA: Diagnosis not present

## 2019-04-02 DIAGNOSIS — R7303 Prediabetes: Secondary | ICD-10-CM

## 2019-04-02 MED ORDER — VITAMIN D (ERGOCALCIFEROL) 1.25 MG (50000 UNIT) PO CAPS: 50000.0000 [IU] | ORAL_CAPSULE | ORAL | 0 refills | Status: DC

## 2019-04-02 MED ORDER — METFORMIN HCL 500 MG PO TABS: 500.0000 mg | ORAL_TABLET | Freq: Every day | ORAL | 0 refills | Status: DC

## 2019-04-04 NOTE — Progress Notes (Signed)
Chief Complaint:   Eric [name] is here to discuss his progress with his obesity treatment plan along with follow-up of his obesity related diagnoses. Eric [name] is on the Category 3 Plan and states he is following his eating plan approximately 75% of the time. Eric [name] states he is walking for 15 minutes 4 times per week.  Today's visit was #: 24 Starting weight: 288 lbs Starting date: 12/14/17 Today's weight: 228 lbs Today's date: 04/02/2019 Total lbs lost to date: 60 Total lbs lost since last in-office visit: 0  Interim History: Eric [name] has done well maintaining his weight over the holidays. He did some celebration eating, but he was mindful of his food choices. He and his wife are ready to get back on track with diet prescription.  Subjective:   1. Pre-diabetes Eric [name] is stable on metformin, and he denies nausea, vomiting, or hypoglycemia. He is working on his diet. I discussed labs with the patient today.  2. Vitamin D deficiency Eric [name] is stable on Vit D, and he denies nausea, vomiting, or muscle weakness. He requests a refill today. I discussed labs with the patient today.  Assessment/Plan:   1. Pre-diabetes Eric [name] will continue to work on weight loss, exercise, and decreasing simple carbohydrates to help decrease the risk of diabetes. We will refill metformin for 1 month, and will continue to monitor.  - metFORMIN (GLUCOPHAGE) 500 MG tablet; Take 1 tablet (500 mg total) by mouth daily with breakfast.  Dispense: 30 tablet; Refill: 0  2. Vitamin D deficiency Low Vitamin D level contributes to fatigue and are associated with obesity, breast, and colon cancer. We will refill prescription Vit D for 1 month. He will follow-up for routine testing of vitamin D, at least 2-3 times per year to avoid over-replacement.  - Vitamin D, Ergocalciferol, (DRISDOL) 1.25 MG (50000 UNIT) CAPS capsule; Take 1 capsule (50,000 Units total) by mouth every 7 (seven) days.  Dispense: 4  capsule; Refill: 0  3. Class 1 obesity due to excess calories with serious comorbidity and body mass index (BMI) of 34.0 to 34.9 in adult Eric [name] is currently in the action stage of change. As such, his goal is to continue with weight loss efforts. He has agreed to on the Category 3 Plan.   We discussed the following exercise goals today: Older adults should follow the adult guidelines. When older adults cannot meet the adult guidelines, they should be as physically active as their abilities and conditions will allow.  Older adults should do exercises that maintain or improve balance if they are at risk of falling.   We discussed the following behavioral modification strategies today: meal planning and cooking strategies.  Eric [name] has agreed to follow-up with our clinic in 3 weeks. He was informed of the importance of frequent follow-up visits to maximize his success with intensive lifestyle modifications for his multiple health conditions.   Objective:   Blood pressure 103/68, pulse (!) 43, temperature 98.2 F (36.8 C), temperature source Oral, height 5\' 8"  (1.727 m), weight 228 lb (103.4 kg), SpO2 99 %. Body mass index is 34.67 kg/m.  General: Cooperative, alert, well developed, in no acute distress. HEENT: Conjunctivae and lids unremarkable. Neck: No thyromegaly.  Cardiovascular: Regular rhythm.  Lungs: Normal work of breathing. Extremities: No edema.  Neurologic: No focal deficits.   Lab Results  Component Value Date   CREATININE 0.97 03/02/2019   BUN 23 03/02/2019   NA 139 03/02/2019   K 4.2 03/02/2019  CL 103 03/02/2019   CO2 29 03/02/2019   Lab Results  Component Value Date   ALT 12 03/02/2019   AST 10 03/02/2019   ALKPHOS 64 03/02/2019   BILITOT 0.8 03/02/2019   Lab Results  Component Value Date   HGBA1C 5.5 03/02/2019   HGBA1C 5.6 08/29/2018   HGBA1C 5.4 03/09/2018   HGBA1C 5.7 (H) 12/14/2017   HGBA1C 5.6 03/08/2017   Lab Results  Component Value  Date   INSULIN 21.3 12/14/2017   Lab Results  Component Value Date   TSH 1.060 12/14/2017   Lab Results  Component Value Date   CHOL 136 03/02/2019   HDL 44.30 03/02/2019   LDLCALC 78 03/02/2019   TRIG 68.0 03/02/2019   CHOLHDL 3 03/02/2019   Lab Results  Component Value Date   WBC 12.8 (H) 09/26/2018   HGB 13.4 09/26/2018   HCT 41.8 09/26/2018   MCV 95.4 09/26/2018   PLT 168 09/26/2018   No results found for: IRON, TIBC, FERRITIN  Obesity Behavioral Intervention Documentation for Insurance:   Approximately 15 minutes were spent on the discussion below.  ASK: We discussed the diagnosis of obesity with Eric [name] today and Eric [name] agreed to give [place] permission to discuss obesity behavioral modification therapy today.  ASSESS: Eric [name] has the diagnosis of obesity and his BMI today is 34.68. Eric [name] is in the action stage of change.   ADVISE: Eric [name] was educated on the multiple health risks of obesity as well as the benefit of weight loss to improve his health. He was advised of the need for long term treatment and the importance of lifestyle modifications to improve his current health and to decrease his risk of future health problems.  AGREE: Multiple dietary modification options and treatment options were discussed and Eric [name] agreed to follow the recommendations documented in the above note.  ARRANGE: Eric [name] was educated on the importance of frequent visits to treat obesity as outlined per CMS and USPSTF guidelines and agreed to schedule his next follow up appointment today.  Attestation Statements:   Reviewed by clinician on day of visit: allergies, medications, problem list, medical history, surgical history, family history, social history, and previous encounter notes.   I, Trixie Dredge, am acting as transcriptionist for [name], MD.  I have reviewed the above documentation for accuracy and completeness, and I agree with the above. -  [name], MD

## 2019-04-05 ENCOUNTER — Encounter (INDEPENDENT_AMBULATORY_CARE_PROVIDER_SITE_OTHER): Payer: Self-pay | Admitting: Family Medicine

## 2019-04-05 ENCOUNTER — Encounter: Payer: Self-pay | Admitting: Family Medicine

## 2019-04-10 ENCOUNTER — Encounter: Payer: Self-pay | Admitting: Family Medicine

## 2019-04-10 ENCOUNTER — Encounter (INDEPENDENT_AMBULATORY_CARE_PROVIDER_SITE_OTHER): Payer: Self-pay | Admitting: Family Medicine

## 2019-04-15 ENCOUNTER — Other Ambulatory Visit: Payer: Self-pay | Admitting: Family Medicine

## 2019-04-23 ENCOUNTER — Encounter (INDEPENDENT_AMBULATORY_CARE_PROVIDER_SITE_OTHER): Payer: Self-pay | Admitting: Family Medicine

## 2019-04-23 ENCOUNTER — Ambulatory Visit (INDEPENDENT_AMBULATORY_CARE_PROVIDER_SITE_OTHER): Payer: Medicare Other | Admitting: Family Medicine

## 2019-04-23 ENCOUNTER — Other Ambulatory Visit: Payer: Self-pay

## 2019-04-23 VITALS — BP 107/71 | HR 67 | Temp 98.2°F | Ht 68.0 in | Wt 229.0 lb

## 2019-04-23 DIAGNOSIS — E669 Obesity, unspecified: Secondary | ICD-10-CM | POA: Diagnosis not present

## 2019-04-23 DIAGNOSIS — E559 Vitamin D deficiency, unspecified: Secondary | ICD-10-CM

## 2019-04-23 DIAGNOSIS — R7303 Prediabetes: Secondary | ICD-10-CM | POA: Diagnosis not present

## 2019-04-23 DIAGNOSIS — Z6834 Body mass index (BMI) 34.0-34.9, adult: Secondary | ICD-10-CM

## 2019-04-23 MED ORDER — METFORMIN HCL 500 MG PO TABS: 500.0000 mg | ORAL_TABLET | Freq: Every day | ORAL | 0 refills | Status: DC

## 2019-04-23 MED ORDER — VITAMIN D (ERGOCALCIFEROL) 1.25 MG (50000 UNIT) PO CAPS: 50000.0000 [IU] | ORAL_CAPSULE | ORAL | 0 refills | Status: DC

## 2019-04-23 NOTE — Progress Notes (Signed)
Chief Complaint:   OBESITY Eric [name] is here to discuss his progress with his obesity treatment plan along with follow-up of his obesity related diagnoses. Eric [name] is on the Category 3 Plan and states he is following his eating plan approximately 85-90% of the time. Eric [name] states he is walking and doing upper body strengthening for 20 minutes 3-5 times per week.  Today's visit was #: 25 Starting weight: 288 lbs Starting date: 12/14/17 Today's weight: 229 lbs Today's date: 04/23/2019 Total lbs lost to date: 59 Total lbs lost since last in-office visit: 0  Interim History: Eric [name] has done well with his Category 3 plan, but he has stalled in weight loss. It has been a while since we checked his RMR, which may have fallen. His hunger is controlled.  Subjective:   1. Vitamin D deficiency Eric [name]'s Vit D level is at goal. He denies nausea, vomiting, or muscle weakness. He requests a refill today.  2. Pre-diabetes Eric [name] is stable on metformin, but he is struggling with weight loss. His last A1c was controlled at 5.5 on metformin. He denies nausea or vomiting.  Assessment/Plan:   1. Vitamin D deficiency Low Vitamin D level contributes to fatigue and are associated with obesity, breast, and colon cancer. We will refill prescription Vitamin D for 1 month. Eric [name] will follow-up for routine testing of Vitamin D, at least 2-3 times per year to avoid over-replacement. We will recheck labs in 1-2 months.  - Vitamin D, Ergocalciferol, (DRISDOL) 1.25 MG (50000 UNIT) CAPS capsule; Take 1 capsule (50,000 Units total) by mouth every 7 (seven) days.  Dispense: 4 capsule; Refill: 0  2. Pre-diabetes Eric [name] will continue to work on weight loss, diet, exercise, and decreasing simple carbohydrates to help decrease the risk of diabetes. We will refill metformin for 1 month. We will recheck IC at his next visit.  - metFORMIN (GLUCOPHAGE) 500 MG tablet; Take 1 tablet (500 mg total) by mouth daily with  breakfast.  Dispense: 30 tablet; Refill: 0  3. Class 1 obesity with serious comorbidity and body mass index (BMI) of 34.0 to 34.9 in adult, unspecified obesity type Eric [name] is currently in the action stage of change. As such, his goal is to continue with weight loss efforts. He has agreed to the Category 3 Plan with breakfast options.  We will recheck IC at his next visit.   Exercise goals: No exercise has been prescribed at this time.  Behavioral modification strategies: increasing lean protein intake and decreasing simple carbohydrates.  Eric [name] has agreed to follow-up with our clinic in 3 to 4 weeks. He was informed of the importance of frequent follow-up visits to maximize his success with intensive lifestyle modifications for his multiple health conditions.   Objective:   Blood pressure 107/71, pulse 67, temperature 98.2 F (36.8 C), temperature source Oral, height 5\' 8"  (1.727 m), weight 229 lb (103.9 kg), SpO2 97 %. Body mass index is 34.82 kg/m.  General: Cooperative, alert, well developed, in no acute distress. HEENT: Conjunctivae and lids unremarkable. Cardiovascular: Regular rhythm.  Lungs: Normal work of breathing. Neurologic: No focal deficits.   Lab Results  Component Value Date   CREATININE 0.97 03/02/2019   BUN 23 03/02/2019   NA 139 03/02/2019   K 4.2 03/02/2019   CL 103 03/02/2019   CO2 29 03/02/2019   Lab Results  Component Value Date   ALT 12 03/02/2019   AST 10 03/02/2019   ALKPHOS 64 03/02/2019   BILITOT 0.8 03/02/2019  Lab Results  Component Value Date   HGBA1C 5.5 03/02/2019   HGBA1C 5.6 08/29/2018   HGBA1C 5.4 03/09/2018   HGBA1C 5.7 (H) 12/14/2017   HGBA1C 5.6 03/08/2017   Lab Results  Component Value Date   INSULIN 21.3 12/14/2017   Lab Results  Component Value Date   TSH 1.060 12/14/2017   Lab Results  Component Value Date   CHOL 136 03/02/2019   HDL 44.30 03/02/2019   LDLCALC 78 03/02/2019   TRIG 68.0 03/02/2019   CHOLHDL 3  03/02/2019   Lab Results  Component Value Date   WBC 12.8 (H) 09/26/2018   HGB 13.4 09/26/2018   HCT 41.8 09/26/2018   MCV 95.4 09/26/2018   PLT 168 09/26/2018   No results found for: IRON, TIBC, FERRITIN  Obesity Behavioral Intervention Documentation for Insurance:   Approximately 15 minutes were spent on the discussion below.  ASK: We discussed the diagnosis of obesity with Eric [name] today and Eric [name] agreed to give [place] permission to discuss obesity behavioral modification therapy today.  ASSESS: Eric [name] has the diagnosis of obesity and his BMI today is 34.83. Eric [name] is in the action stage of change.   ADVISE: Eric [name] was educated on the multiple health risks of obesity as well as the benefit of weight loss to improve his health. He was advised of the need for long term treatment and the importance of lifestyle modifications to improve his current health and to decrease his risk of future health problems.  AGREE: Multiple dietary modification options and treatment options were discussed and [name] agreed to follow the recommendations documented in the above note.  ARRANGE: Elex was educated on the importance of frequent visits to treat obesity as outlined per CMS and USPSTF guidelines and agreed to schedule his next follow up appointment today.  Attestation Statements:   Reviewed by clinician on day of visit: allergies, medications, problem list, medical history, surgical history, family history, social history, and previous encounter notes.   I, Eric [name], am acting as transcriptionist for [name], MD.  I have reviewed the above documentation for accuracy and completeness, and I agree with the above. -  [name], MD

## 2019-05-21 ENCOUNTER — Other Ambulatory Visit: Payer: Self-pay

## 2019-05-21 ENCOUNTER — Encounter (INDEPENDENT_AMBULATORY_CARE_PROVIDER_SITE_OTHER): Payer: Self-pay | Admitting: Family Medicine

## 2019-05-21 ENCOUNTER — Ambulatory Visit (INDEPENDENT_AMBULATORY_CARE_PROVIDER_SITE_OTHER): Payer: Medicare Other | Admitting: Family Medicine

## 2019-05-21 VITALS — BP 108/62 | HR 72 | Temp 98.4°F | Ht 68.0 in | Wt 230.0 lb

## 2019-05-21 DIAGNOSIS — E559 Vitamin D deficiency, unspecified: Secondary | ICD-10-CM

## 2019-05-21 DIAGNOSIS — Z6835 Body mass index (BMI) 35.0-35.9, adult: Secondary | ICD-10-CM

## 2019-05-21 DIAGNOSIS — R7303 Prediabetes: Secondary | ICD-10-CM

## 2019-05-21 MED ORDER — VITAMIN D (ERGOCALCIFEROL) 1.25 MG (50000 UNIT) PO CAPS: 50000.0000 [IU] | ORAL_CAPSULE | ORAL | 0 refills | Status: DC

## 2019-05-21 MED ORDER — METFORMIN HCL 500 MG PO TABS: 500.0000 mg | ORAL_TABLET | Freq: Every day | ORAL | 0 refills | Status: DC

## 2019-05-21 NOTE — Progress Notes (Signed)
Chief Complaint:   OBESITY Eric [name] is here to discuss his progress with his obesity treatment plan along with follow-up of his obesity related diagnoses. Eric [name] is on the Category 3 Plan with breakfast options and states he is following his eating plan approximately 75% of the time. Eric [name] states he is walking and lifting weights for 20-30 minutes 3 times per week.  Today's visit was #: 26 Starting weight: 288 lbs Starting date: 12/14/17 Today's weight: 230 lbs Today's date: 05/21/2019 Total lbs lost to date: 58 Total lbs lost since last in-office visit: 0  Interim History: Mccartney is stable on his Category 3 plan, but he has started to regain some weight. His RMR has dropped significantly, which is contributing to his weight gain.  Subjective:   1. Pre-diabetes Eric [name] is tolerating metformin well. His last A1c was well controlled at 5.5.  2. Vitamin D deficiency Eric [name]'s last Vit D level was at goal. He requests a refill today.  Assessment/Plan:   1. Pre-diabetes Eric [name] will continue to work on weight loss, diet, exercise, and decreasing simple carbohydrates to help decrease the risk of diabetes. We will refill metformin for 1 month, and will continue to monitor.  - metFORMIN (GLUCOPHAGE) 500 MG tablet; Take 1 tablet (500 mg total) by mouth daily with breakfast.  Dispense: 30 tablet; Refill: 0  2. Vitamin D deficiency Low Vitamin D level contributes to fatigue and are associated with obesity, breast, and colon cancer. We will refill prescription Vitamin D for 1 month. Eric [name] will follow-up for routine testing of Vitamin D, at least 2-3 times per year to avoid over-replacement.  - Vitamin D, Ergocalciferol, (DRISDOL) 1.25 MG (50000 UNIT) CAPS capsule; Take 1 capsule (50,000 Units total) by mouth every 7 (seven) days.  Dispense: 4 capsule; Refill: 0  3. Class 2 severe obesity with serious comorbidity and body mass index (BMI) of 35.0 to 35.9 in adult, unspecified obesity  type [facility]) Eric [name] is currently in the action stage of change. As such, his goal is to continue with weight loss efforts. He has agreed to the Category 3 Plan.   We discussed the importance of following the Category 3 plan strictly, and eating everything.  Exercise goals: Eric [name] is to continue his exercise regimen as is.  Behavioral modification strategies: increasing lean protein intake and no skipping meals.  Eric [name] has agreed to follow-up with our clinic in 4 weeks. He was informed of the importance of frequent follow-up visits to maximize his success with intensive lifestyle modifications for his multiple health conditions.   Objective:   Blood pressure 108/62, pulse 72, temperature 98.4 F (36.9 C), temperature source Oral, height 5\' 8"  (1.727 m), weight 230 lb (104.3 kg), SpO2 99 %. Body mass index is 34.97 kg/m.  General: Cooperative, alert, well developed, in no acute distress. HEENT: Conjunctivae and lids unremarkable. Cardiovascular: Regular rhythm.  Lungs: Normal work of breathing. Neurologic: No focal deficits.   Lab Results  Component Value Date   CREATININE 0.97 03/02/2019   BUN 23 03/02/2019   NA 139 03/02/2019   K 4.2 03/02/2019   CL 103 03/02/2019   CO2 29 03/02/2019   Lab Results  Component Value Date   ALT 12 03/02/2019   AST 10 03/02/2019   ALKPHOS 64 03/02/2019   BILITOT 0.8 03/02/2019   Lab Results  Component Value Date   HGBA1C 5.5 03/02/2019   HGBA1C 5.6 08/29/2018   HGBA1C 5.4 03/09/2018   HGBA1C 5.7 (H) 12/14/2017   HGBA1C  5.6 03/08/2017   Lab Results  Component Value Date   INSULIN 21.3 12/14/2017   Lab Results  Component Value Date   TSH 1.060 12/14/2017   Lab Results  Component Value Date   CHOL 136 03/02/2019   HDL 44.30 03/02/2019   LDLCALC 78 03/02/2019   TRIG 68.0 03/02/2019   CHOLHDL 3 03/02/2019   Lab Results  Component Value Date   WBC 12.8 (H) 09/26/2018   HGB 13.4 09/26/2018   HCT 41.8 09/26/2018   MCV 95.4  09/26/2018   PLT 168 09/26/2018   No results found for: IRON, TIBC, FERRITIN  Obesity Behavioral Intervention Documentation for Insurance:   Approximately 15 minutes were spent on the discussion below.  ASK: We discussed the diagnosis of obesity with Eric [name] today and Eric [name] agreed to give [place] permission to discuss obesity behavioral modification therapy today.  ASSESS: Eric [name] has the diagnosis of obesity and his BMI today is 34.98. Eric [name] is in the action stage of change.   ADVISE: Eric [name] was educated on the multiple health risks of obesity as well as the benefit of weight loss to improve his health. He was advised of the need for long term treatment and the importance of lifestyle modifications to improve his current health and to decrease his risk of future health problems.  AGREE: Multiple dietary modification options and treatment options were discussed and Eric [name] agreed to follow the recommendations documented in the above note.  ARRANGE: Eric [name] was educated on the importance of frequent visits to treat obesity as outlined per CMS and USPSTF guidelines and agreed to schedule his next follow up appointment today.  Attestation Statements:   Reviewed by clinician on day of visit: allergies, medications, problem list, medical history, surgical history, family history, social history, and previous encounter notes.   I, Eric [name], am acting as transcriptionist for Eric Nip, MD.  I have reviewed the above documentation for accuracy and completeness, and I agree with the above. -  Eric Nip, MD

## 2019-05-28 DIAGNOSIS — M19011 Primary osteoarthritis, right shoulder: Secondary | ICD-10-CM | POA: Diagnosis not present

## 2019-05-28 DIAGNOSIS — M19012 Primary osteoarthritis, left shoulder: Secondary | ICD-10-CM | POA: Diagnosis not present

## 2019-06-12 ENCOUNTER — Other Ambulatory Visit: Payer: Self-pay | Admitting: Family Medicine

## 2019-06-18 ENCOUNTER — Ambulatory Visit (INDEPENDENT_AMBULATORY_CARE_PROVIDER_SITE_OTHER): Payer: Medicare Other | Admitting: Family Medicine

## 2019-06-18 ENCOUNTER — Encounter (INDEPENDENT_AMBULATORY_CARE_PROVIDER_SITE_OTHER): Payer: Self-pay | Admitting: Family Medicine

## 2019-06-18 ENCOUNTER — Encounter (INDEPENDENT_AMBULATORY_CARE_PROVIDER_SITE_OTHER): Payer: Self-pay

## 2019-06-18 ENCOUNTER — Other Ambulatory Visit: Payer: Self-pay

## 2019-06-18 VITALS — BP 130/83 | HR 41 | Temp 97.9°F | Ht 68.0 in | Wt 233.0 lb

## 2019-06-18 DIAGNOSIS — F3289 Other specified depressive episodes: Secondary | ICD-10-CM | POA: Diagnosis not present

## 2019-06-18 DIAGNOSIS — R7303 Prediabetes: Secondary | ICD-10-CM | POA: Diagnosis not present

## 2019-06-18 DIAGNOSIS — E559 Vitamin D deficiency, unspecified: Secondary | ICD-10-CM

## 2019-06-18 DIAGNOSIS — Z6835 Body mass index (BMI) 35.0-35.9, adult: Secondary | ICD-10-CM | POA: Diagnosis not present

## 2019-06-18 MED ORDER — BUPROPION HCL ER (SR) 150 MG PO TB12: 150.0000 mg | ORAL_TABLET | Freq: Every day | ORAL | 0 refills | Status: DC

## 2019-06-18 MED ORDER — METFORMIN HCL 500 MG PO TABS: 500.0000 mg | ORAL_TABLET | Freq: Every day | ORAL | 0 refills | Status: DC

## 2019-06-18 MED ORDER — VITAMIN D (ERGOCALCIFEROL) 1.25 MG (50000 UNIT) PO CAPS: 50000.0000 [IU] | ORAL_CAPSULE | ORAL | 0 refills | Status: DC

## 2019-06-18 NOTE — Progress Notes (Signed)
Chief Complaint:   OBESITY Eric [name] is here to discuss his progress with his obesity treatment plan along with follow-up of his obesity related diagnoses. Eric [name] is on the Category 3 Plan and states he is following his eating plan approximately 75% of the time. Eric [name] states he is doing some yard work for 3 hours 3 times per week.  Today's visit was #: 18 Starting weight: 288 lbs Starting date: 12/14/2017 Today's weight: 233 lbs Today's date: 06/18/2019 Total lbs lost to date: 55 Total lbs lost since last in-office visit: 0  Interim History: Eric [name] recently returned from a trip to [facility] with his family. He reports increase in afternoon polyphagia and has been consuming more related to nerves/stress.  Subjective:   1. Vitamin D deficiency Eric [name]'s last Vit D level was 52.56 on 03/02/2019. He is currently on prescription strength Vit D.  2. Other depression, with emotional eating Eric [name] reports increased afternoon polyphagia. He denies a history of seizures.  3. Pre-diabetes Eric [name]'s last A1c was 5.5 on 03/02/2019. His last CMP was stable and he is tolerating metformin well.  Assessment/Plan:   1. Vitamin D deficiency Low Vitamin D level contributes to fatigue and are associated with obesity, breast, and colon cancer. We will refill prescription Vitamin D for 1 month. Eric [name] will follow-up for routine testing of Vitamin D, at least 2-3 times per year to avoid over-replacement. We will check labs today.  - VITAMIN D 25 Hydroxy (Vit-D Deficiency, Fractures)  - Vitamin D, Ergocalciferol, (DRISDOL) 1.25 MG (50000 UNIT) CAPS capsule; Take 1 capsule (50,000 Units total) by mouth every 7 (seven) days.  Dispense: 4 capsule; Refill: 0  2.Emotional eating Behavior modification techniques were discussed today to help Eric [name] deal with his emotional/non-hunger eating behaviors. Eric [name] agreed to start bupropion SR 150 mg q daily with no refills. Orders and follow up as  documented in patient record.   - buPROPion (WELLBUTRIN SR) 150 MG 12 hr tablet; Take 1 tablet (150 mg total) by mouth daily.  Dispense: 30 tablet; Refill: 0  3. Pre-diabetes Eric [name] will continue to work on weight loss, exercise, and decreasing simple carbohydrates to help decrease the risk of diabetes. We will refill metformin for 1 month, and we will check labs today.  - Comprehensive metabolic panel - Hemoglobin A1c  - metFORMIN (GLUCOPHAGE) 500 MG tablet; Take 1 tablet (500 mg total) by mouth daily with breakfast.  Dispense: 30 tablet; Refill: 0  4. Class 2 severe obesity with serious comorbidity and body mass index (BMI) of 35.0 to 35.9 in adult, unspecified obesity type [facility]) Eric [name] is currently in the action stage of change. As such, his goal is to continue with weight loss efforts. He has agreed to the Category 3 Plan.   Exercise goals: As is.  Behavioral modification strategies: better snacking choices and emotional eating strategies.  Eric [name] has agreed to follow-up with our clinic in 3 to 4 weeks. He was informed of the importance of frequent follow-up visits to maximize his success with intensive lifestyle modifications for his multiple health conditions.   Eric [name] was informed we would discuss his lab results at his next visit unless there is a critical issue that needs to be addressed sooner. Eric [name] agreed to keep his next visit at the agreed upon time to discuss these results.  Objective:   Blood pressure 130/83, pulse (!) 41, temperature 97.9 F (36.6 C), temperature source Oral, height 5\' 8"  (1.727 m), weight 233 lb (105.7 kg), SpO2 98 %. Body  mass index is 35.43 kg/m.  General: Cooperative, alert, well developed, in no acute distress. HEENT: Conjunctivae and lids unremarkable. Cardiovascular: Regular rhythm.  Lungs: Normal work of breathing. Neurologic: No focal deficits.   Lab Results  Component Value Date   CREATININE 0.97 03/02/2019   BUN 23 03/02/2019     NA 139 03/02/2019   K 4.2 03/02/2019   CL 103 03/02/2019   CO2 29 03/02/2019   Lab Results  Component Value Date   ALT 12 03/02/2019   AST 10 03/02/2019   ALKPHOS 64 03/02/2019   BILITOT 0.8 03/02/2019   Lab Results  Component Value Date   HGBA1C 5.5 03/02/2019   HGBA1C 5.6 08/29/2018   HGBA1C 5.4 03/09/2018   HGBA1C 5.7 (H) 12/14/2017   HGBA1C 5.6 03/08/2017   Lab Results  Component Value Date   INSULIN 21.3 12/14/2017   Lab Results  Component Value Date   TSH 1.060 12/14/2017   Lab Results  Component Value Date   CHOL 136 03/02/2019   HDL 44.30 03/02/2019   LDLCALC 78 03/02/2019   TRIG 68.0 03/02/2019   CHOLHDL 3 03/02/2019   Lab Results  Component Value Date   WBC 12.8 (H) 09/26/2018   HGB 13.4 09/26/2018   HCT 41.8 09/26/2018   MCV 95.4 09/26/2018   PLT 168 09/26/2018   No results found for: IRON, TIBC, FERRITIN  Obesity Behavioral Intervention Documentation for Insurance:   Approximately 15 minutes were spent on the discussion below.  ASK: We discussed the diagnosis of obesity with Eric [name] today and Eric [name] agreed to give [place] permission to discuss obesity behavioral modification therapy today.  ASSESS: Eric [name] has the diagnosis of obesity and his BMI today is 35.44. Eric [name] is in the action stage of change.   ADVISE: Eric [name] was educated on the multiple health risks of obesity as well as the benefit of weight loss to improve his health. He was advised of the need for long term treatment and the importance of lifestyle modifications to improve his current health and to decrease his risk of future health problems.  AGREE: Multiple dietary modification options and treatment options were discussed and Eric [name] agreed to follow the recommendations documented in the above note.  ARRANGE: Eric [name] was educated on the importance of frequent visits to treat obesity as outlined per CMS and USPSTF guidelines and agreed to schedule his next follow up  appointment today.  Attestation Statements:   Reviewed by clinician on day of visit: allergies, medications, problem list, medical history, surgical history, family history, social history, and previous encounter notes.   I, Trixie Dredge, am acting as transcriptionist for [name], MD.  I have reviewed the above documentation for accuracy and completeness, and I agree with the above. -  [name], MD

## 2019-06-19 LAB — COMPREHENSIVE METABOLIC PANEL
ALT: 15 IU/L (ref 0–44)
AST: 14 IU/L (ref 0–40)
Albumin/Globulin Ratio: 2.5 — ABNORMAL HIGH (ref 1.2–2.2)
Albumin: 4.7 g/dL (ref 3.7–4.7)
Alkaline Phosphatase: 75 IU/L (ref 39–117)
BUN/Creatinine Ratio: 13 (ref 10–24)
BUN: 15 mg/dL (ref 8–27)
Bilirubin Total: 0.6 mg/dL (ref 0.0–1.2)
CO2: 23 mmol/L (ref 20–29)
Calcium: 9.6 mg/dL (ref 8.6–10.2)
Chloride: 103 mmol/L (ref 96–106)
Creatinine, Ser: 1.2 mg/dL (ref 0.76–1.27)
GFR calc Af Amer: 69 mL/min/{1.73_m2} (ref >59)
GFR calc non Af Amer: 60 mL/min/{1.73_m2} (ref >59)
Globulin, Total: 1.9 g/dL (ref 1.5–4.5)
Glucose: 89 mg/dL (ref 65–99)
Potassium: 5.2 mmol/L (ref 3.5–5.2)
Sodium: 141 mmol/L (ref 134–144)
Total Protein: 6.6 g/dL (ref 6.0–8.5)

## 2019-06-19 LAB — VITAMIN D 25 HYDROXY (VIT D DEFICIENCY, FRACTURES): Vit D, 25-Hydroxy: 58.4 ng/mL (ref 30.0–100.0)

## 2019-06-19 LAB — HEMOGLOBIN A1C
Est. average glucose Bld gHb Est-mCnc: 105 mg/dL
Hgb A1c MFr Bld: 5.3 % (ref 4.8–5.6)

## 2019-06-19 NOTE — Telephone Encounter (Signed)
Please advise 

## 2019-07-13 ENCOUNTER — Other Ambulatory Visit (INDEPENDENT_AMBULATORY_CARE_PROVIDER_SITE_OTHER): Payer: Self-pay | Admitting: Family Medicine

## 2019-07-13 DIAGNOSIS — F3289 Other specified depressive episodes: Secondary | ICD-10-CM

## 2019-07-16 ENCOUNTER — Encounter (INDEPENDENT_AMBULATORY_CARE_PROVIDER_SITE_OTHER): Payer: Self-pay

## 2019-07-23 ENCOUNTER — Ambulatory Visit (INDEPENDENT_AMBULATORY_CARE_PROVIDER_SITE_OTHER): Payer: Medicare Other | Admitting: Family Medicine

## 2019-07-23 ENCOUNTER — Encounter (INDEPENDENT_AMBULATORY_CARE_PROVIDER_SITE_OTHER): Payer: Self-pay | Admitting: Family Medicine

## 2019-07-23 ENCOUNTER — Other Ambulatory Visit: Payer: Self-pay

## 2019-07-23 VITALS — BP 117/66 | HR 49 | Temp 97.9°F | Ht 68.0 in | Wt 232.0 lb

## 2019-07-23 DIAGNOSIS — E559 Vitamin D deficiency, unspecified: Secondary | ICD-10-CM

## 2019-07-23 DIAGNOSIS — Z6835 Body mass index (BMI) 35.0-35.9, adult: Secondary | ICD-10-CM | POA: Diagnosis not present

## 2019-07-23 DIAGNOSIS — R7303 Prediabetes: Secondary | ICD-10-CM

## 2019-07-23 DIAGNOSIS — F3289 Other specified depressive episodes: Secondary | ICD-10-CM | POA: Diagnosis not present

## 2019-07-23 MED ORDER — VITAMIN D (ERGOCALCIFEROL) 1.25 MG (50000 UNIT) PO CAPS: 50000.0000 [IU] | ORAL_CAPSULE | ORAL | 0 refills | Status: DC

## 2019-07-23 MED ORDER — BUPROPION HCL ER (SR) 150 MG PO TB12: 150.0000 mg | ORAL_TABLET | Freq: Every day | ORAL | 0 refills | Status: DC

## 2019-07-23 MED ORDER — METFORMIN HCL 500 MG PO TABS: 500.0000 mg | ORAL_TABLET | Freq: Every day | ORAL | 0 refills | Status: DC

## 2019-07-23 NOTE — Progress Notes (Signed)
Chief Complaint:   OBESITY Gerry is here to discuss his progress with his obesity treatment plan along with follow-up of his obesity related diagnoses. Jarmal is on the Category 3 Plan and states he is following his eating plan approximately 50% of the time. Shedrick states he is cutting the grass and fishing for 60 minutes 3 times per week.  Today's visit was #: 28 Starting weight: 288 lbs Starting date: 12/14/2017 Today's weight: 232 lbs Today's date: 07/23/2019 Total lbs lost to date: 56 Total lbs lost since last in-office visit: 1  Interim History: [name] has been deviating from his Category 3 meal plan, and fishing more which keeps him sedentary and increases his beer intake. He has been entertaining family and friends the last several weeks.  Subjective:   1. Vitamin D deficiency Ryman denies excessive fatigue, and he is tolerating prescription strength Vit D.  2. Pre-diabetes Eual's last A1c on 06/18/2019 was 5.3. He is tolerating metformin well.  3. Other depression, with emotional eating Reinhold is tolerating Wellbutrin well and he denies increased pre-existing benign essential tremor symptoms.  Assessment/Plan:   1. Vitamin D deficiency Low Vitamin D level contributes to fatigue and are associated with obesity, breast, and colon cancer. We will refill prescription Vitamin D for 1 month. Antron will follow-up for routine testing of Vitamin D, at least 2-3 times per year to avoid over-replacement.  - Vitamin D, Ergocalciferol, (DRISDOL) 1.25 MG (50000 UNIT) CAPS capsule; Take 1 capsule (50,000 Units total) by mouth every 7 (seven) days.  Dispense: 4 capsule; Refill: 0  2. Pre-diabetes .Knightly will continue to work on weight loss, exercise, and decreasing simple carbohydrates to help decrease the risk of diabetes. We will refill metformin for 1 month.  - metFORMIN (GLUCOPHAGE) 500 MG tablet; Take 1 tablet (500 mg total) by mouth daily with breakfast.   Dispense: 30 tablet; Refill: 0  3. Other depression, with emotional eating Behavior modification techniques were discussed today to help Deangelo deal with his emotional/non-hunger eating behaviors. We will refill Wellbutrin SR for 1 month. Orders and follow up as documented in patient record.   - buPROPion (WELLBUTRIN SR) 150 MG 12 hr tablet; Take 1 tablet (150 mg total) by mouth daily.  Dispense: 30 tablet; Refill: 0  4. Class 2 severe obesity with serious comorbidity and body mass index (BMI) of 35.0 to 35.9 in adult, unspecified obesity type [facility]) Emrik is currently in the action stage of change. As such, his goal is to continue with weight loss efforts. He has agreed to the Category 3 Plan.   Exercise goals: As is.  Behavioral modification strategies: increasing lean protein intake, decreasing eating out and celebration eating strategies.  Kaniela has agreed to follow-up with our clinic in 4 weeks. He was informed of the importance of frequent follow-up visits to maximize his success with intensive lifestyle modifications for his multiple health conditions.   Objective:   Blood pressure 117/66, pulse (!) 49, temperature 97.9 F (36.6 C), temperature source Oral, height 5\' 8"  (1.727 m), weight 232 lb (105.2 kg), SpO2 98 %. Body mass index is 35.28 kg/m.  General: Cooperative, alert, well developed, in no acute distress. HEENT: Conjunctivae and lids unremarkable. Cardiovascular: Regular rhythm.  Lungs: Normal work of breathing. Neurologic: No focal deficits.   Lab Results  Component Value Date   CREATININE 1.20 06/18/2019   BUN 15 06/18/2019   NA 141 06/18/2019   K 5.2 06/18/2019   CL 103 06/18/2019  CO2 23 06/18/2019   Lab Results  Component Value Date   ALT 15 06/18/2019   AST 14 06/18/2019   ALKPHOS 75 06/18/2019   BILITOT 0.6 06/18/2019   Lab Results  Component Value Date   HGBA1C 5.3 06/18/2019   HGBA1C 5.5 03/02/2019   HGBA1C 5.6 08/29/2018   HGBA1C 5.4  03/09/2018   HGBA1C 5.7 (H) 12/14/2017   Lab Results  Component Value Date   INSULIN 21.3 12/14/2017   Lab Results  Component Value Date   TSH 1.060 12/14/2017   Lab Results  Component Value Date   CHOL 136 03/02/2019   HDL 44.30 03/02/2019   LDLCALC 78 03/02/2019   TRIG 68.0 03/02/2019   CHOLHDL 3 03/02/2019   Lab Results  Component Value Date   WBC 12.8 (H) 09/26/2018   HGB 13.4 09/26/2018   HCT 41.8 09/26/2018   MCV 95.4 09/26/2018   PLT 168 09/26/2018   No results found for: IRON, TIBC, FERRITIN  Obesity Behavioral Intervention Documentation for Insurance:   Approximately 15 minutes were spent on the discussion below.  ASK: We discussed the diagnosis of obesity with [name] today and Nesbitt agreed to give [place] permission to discuss obesity behavioral modification therapy today.  ASSESS: Annette has the diagnosis of obesity and his BMI today is 35.28. Harjas is in the action stage of change.   ADVISE: Calletano was educated on the multiple health risks of obesity as well as the benefit of weight loss to improve his health. He was advised of the need for long term treatment and the importance of lifestyle modifications to improve his current health and to decrease his risk of future health problems.  AGREE: Multiple dietary modification options and treatment options were discussed and Dajean agreed to follow the recommendations documented in the above note.  ARRANGE: Uriel was educated on the importance of frequent visits to treat obesity as outlined per CMS and USPSTF guidelines and agreed to schedule his next follow up appointment today.  Attestation Statements:   Reviewed by clinician on day of visit: allergies, medications, problem list, medical history, surgical history, family history, social history, and previous encounter notes.   I, Trixie Dredge, am acting as transcriptionist for [name], MD.  I have reviewed the above documentation for  accuracy and completeness, and I agree with the above. -  [name], MD

## 2019-07-26 ENCOUNTER — Other Ambulatory Visit: Payer: Self-pay | Admitting: Family Medicine

## 2019-07-26 DIAGNOSIS — N400 Enlarged prostate without lower urinary tract symptoms: Secondary | ICD-10-CM

## 2019-08-15 ENCOUNTER — Encounter (INDEPENDENT_AMBULATORY_CARE_PROVIDER_SITE_OTHER): Payer: Self-pay | Admitting: Family Medicine

## 2019-08-16 ENCOUNTER — Other Ambulatory Visit (INDEPENDENT_AMBULATORY_CARE_PROVIDER_SITE_OTHER): Payer: Self-pay

## 2019-08-16 DIAGNOSIS — F3289 Other specified depressive episodes: Secondary | ICD-10-CM

## 2019-08-16 MED ORDER — BUPROPION HCL ER (SR) 150 MG PO TB12: 150.0000 mg | ORAL_TABLET | Freq: Every day | ORAL | 0 refills | Status: DC

## 2019-08-21 ENCOUNTER — Other Ambulatory Visit (INDEPENDENT_AMBULATORY_CARE_PROVIDER_SITE_OTHER): Payer: Self-pay | Admitting: Family Medicine

## 2019-08-21 DIAGNOSIS — R7303 Prediabetes: Secondary | ICD-10-CM

## 2019-08-22 ENCOUNTER — Ambulatory Visit (INDEPENDENT_AMBULATORY_CARE_PROVIDER_SITE_OTHER): Payer: Medicare Other | Admitting: Family Medicine

## 2019-08-22 ENCOUNTER — Encounter (INDEPENDENT_AMBULATORY_CARE_PROVIDER_SITE_OTHER): Payer: Self-pay | Admitting: Family Medicine

## 2019-08-22 ENCOUNTER — Other Ambulatory Visit: Payer: Self-pay

## 2019-08-22 VITALS — BP 109/61 | HR 52 | Temp 97.9°F | Ht 68.0 in | Wt 229.0 lb

## 2019-08-22 DIAGNOSIS — F3289 Other specified depressive episodes: Secondary | ICD-10-CM | POA: Diagnosis not present

## 2019-08-22 DIAGNOSIS — Z6834 Body mass index (BMI) 34.0-34.9, adult: Secondary | ICD-10-CM | POA: Diagnosis not present

## 2019-08-22 DIAGNOSIS — E669 Obesity, unspecified: Secondary | ICD-10-CM

## 2019-08-22 DIAGNOSIS — R7303 Prediabetes: Secondary | ICD-10-CM | POA: Diagnosis not present

## 2019-08-22 DIAGNOSIS — E559 Vitamin D deficiency, unspecified: Secondary | ICD-10-CM

## 2019-08-22 MED ORDER — BUPROPION HCL ER (SR) 150 MG PO TB12: 150.0000 mg | ORAL_TABLET | Freq: Every day | ORAL | 0 refills | Status: DC

## 2019-08-22 MED ORDER — METFORMIN HCL 500 MG PO TABS: 500.0000 mg | ORAL_TABLET | Freq: Every day | ORAL | 0 refills | Status: DC

## 2019-08-22 MED ORDER — VITAMIN D (ERGOCALCIFEROL) 1.25 MG (50000 UNIT) PO CAPS: 50000.0000 [IU] | ORAL_CAPSULE | ORAL | 0 refills | Status: DC

## 2019-08-22 NOTE — Progress Notes (Signed)
Chief Complaint:   OBESITY Eric [name] is here to discuss his progress with his obesity treatment plan along with follow-up of his obesity related diagnoses. Eric [name] is on the Category 3 Plan and states he is following his eating plan approximately 80% of the time. Eric [name] states he is doing yard work for 2 hours and fishing.  Today's visit was #: 21 Starting weight: 288 lbs Starting date: 12/14/2017 Today's weight: 229 lbs Today's date: 08/22/2019 Total lbs lost to date: 59 Total lbs lost since last in-office visit: 3  Interim History: Eric [name] is quite active in the Spring and Summer months, with fishing and yard work. He believes that he isn't drinking enough water for his level of daily activity.  Subjective:   1. Vitamin D deficiency Eric [name]'s Vit D level on 06/18/2019 was 58.4. He is on prescription strength Vit D supplementation, and is tolerating it well.  2. Pre-diabetes Eric [name] A1c on 06/18/2019 was 5.3. He is on metformin 500 mg q breakfast.  3. Other depression with emotional eating Eric [name] reports stable mood. He is unsure if bupropion SR 150 mg q daily is helping with emotional eating. However he denies side effects.  Assessment/Plan:   1. Vitamin D deficiency Low Vitamin D level contributes to fatigue and are associated with obesity, breast, and colon cancer. We will refill prescription Vitamin D for 1 month. Eric [name] will follow-up for routine testing of Vitamin D, at least 2-3 times per year to avoid over-replacement. We will recheck labs at his next office visit.  - Vitamin D, Ergocalciferol, (DRISDOL) 1.25 MG (50000 UNIT) CAPS capsule; Take 1 capsule (50,000 Units total) by mouth every 7 (seven) days.  Dispense: 4 capsule; Refill: 0  2. Pre-diabetes Eric [name] will continue his Category 3 meal plan, and will continue to work on weight loss, exercise, and decreasing simple carbohydrates to help decrease the risk of diabetes. We will refill metformin for 1 month, and we  will recheck labs at his next office visit.  - metFORMIN (GLUCOPHAGE) 500 MG tablet; Take 1 tablet (500 mg total) by mouth daily with breakfast.  Dispense: 30 tablet; Refill: 0  3. Other depression with emotional eating Behavior modification techniques were discussed today to help Eric [name] deal with his emotional/non-hunger eating behaviors. We will refill bupropion SR for 1 month. Orders and follow up as documented in patient record.   - buPROPion (WELLBUTRIN SR) 150 MG 12 hr tablet; Take 1 tablet (150 mg total) by mouth daily.  Dispense: 30 tablet; Refill: 0  4. Class 1 obesity with serious comorbidity and body mass index (BMI) of 34.0 to 34.9 in adult, unspecified obesity type Eric [name] is currently in the action stage of change. As such, his goal is to continue with weight loss efforts. He has agreed to the Category 3 Plan.   Exercise goals: As is.  Behavioral modification strategies: increasing lean protein intake, decreasing simple carbohydrates, increasing water intake, meal planning and cooking strategies and travel eating strategies.  Eric [name] has agreed to follow-up with our clinic in 3 to 4 weeks. He was informed of the importance of frequent follow-up visits to maximize his success with intensive lifestyle modifications for his multiple health conditions.   Objective:   Blood pressure 109/61, pulse (!) 52, temperature 97.9 F (36.6 C), temperature source Oral, height 5\' 8"  (1.727 m), weight 229 lb (103.9 kg), SpO2 99 %. Body mass index is 34.82 kg/m.  General: Cooperative, alert, well developed, in no acute distress. HEENT: Conjunctivae and lids unremarkable.  Cardiovascular: Regular rhythm.  Lungs: Normal work of breathing. Neurologic: No focal deficits.   Lab Results  Component Value Date   CREATININE 1.20 06/18/2019   BUN 15 06/18/2019   NA 141 06/18/2019   K 5.2 06/18/2019   CL 103 06/18/2019   CO2 23 06/18/2019   Lab Results  Component Value Date   ALT 15  06/18/2019   AST 14 06/18/2019   ALKPHOS 75 06/18/2019   BILITOT 0.6 06/18/2019   Lab Results  Component Value Date   HGBA1C 5.3 06/18/2019   HGBA1C 5.5 03/02/2019   HGBA1C 5.6 08/29/2018   HGBA1C 5.4 03/09/2018   HGBA1C 5.7 (H) 12/14/2017   Lab Results  Component Value Date   INSULIN 21.3 12/14/2017   Lab Results  Component Value Date   TSH 1.060 12/14/2017   Lab Results  Component Value Date   CHOL 136 03/02/2019   HDL 44.30 03/02/2019   LDLCALC 78 03/02/2019   TRIG 68.0 03/02/2019   CHOLHDL 3 03/02/2019   Lab Results  Component Value Date   WBC 12.8 (H) 09/26/2018   HGB 13.4 09/26/2018   HCT 41.8 09/26/2018   MCV 95.4 09/26/2018   PLT 168 09/26/2018   No results found for: IRON, TIBC, FERRITIN  Obesity Behavioral Intervention Documentation for Insurance:   Approximately 15 minutes were spent on the discussion below.  ASK: We discussed the diagnosis of obesity with Eric [name] today and Eric [name] agreed to give [place] permission to discuss obesity behavioral modification therapy today.  ASSESS: Eric [name] has the diagnosis of obesity and his BMI today is 34.83. Eric [name] is in the action stage of change.   ADVISE: Eric [name] was educated on the multiple health risks of obesity as well as the benefit of weight loss to improve his health. He was advised of the need for long term treatment and the importance of lifestyle modifications to improve his current health and to decrease his risk of future health problems.  AGREE: Multiple dietary modification options and treatment options were discussed and Eric [name] agreed to follow the recommendations documented in the above note.  ARRANGE: Eric [name] was educated on the importance of frequent visits to treat obesity as outlined per CMS and USPSTF guidelines and agreed to schedule his next follow up appointment today.  Attestation Statements:   Reviewed by clinician on day of visit: allergies, medications, problem list, medical  history, surgical history, family history, social history, and previous encounter notes.   I, Trixie Dredge, am acting as transcriptionist for [name], MD.  I have reviewed the above documentation for accuracy and completeness, and I agree with the above. -  [name], MD

## 2019-08-25 ENCOUNTER — Other Ambulatory Visit: Payer: Self-pay | Admitting: Family Medicine

## 2019-08-29 DIAGNOSIS — E119 Type 2 diabetes mellitus without complications: Secondary | ICD-10-CM | POA: Diagnosis not present

## 2019-08-29 DIAGNOSIS — Z7984 Long term (current) use of oral hypoglycemic drugs: Secondary | ICD-10-CM | POA: Diagnosis not present

## 2019-08-29 DIAGNOSIS — H2513 Age-related nuclear cataract, bilateral: Secondary | ICD-10-CM | POA: Diagnosis not present

## 2019-08-31 ENCOUNTER — Telehealth (INDEPENDENT_AMBULATORY_CARE_PROVIDER_SITE_OTHER): Payer: Medicare Other | Admitting: Family Medicine

## 2019-08-31 ENCOUNTER — Telehealth: Payer: Self-pay | Admitting: Family Medicine

## 2019-08-31 ENCOUNTER — Other Ambulatory Visit: Payer: Self-pay

## 2019-08-31 ENCOUNTER — Encounter: Payer: Self-pay | Admitting: Family Medicine

## 2019-08-31 DIAGNOSIS — E785 Hyperlipidemia, unspecified: Secondary | ICD-10-CM

## 2019-08-31 DIAGNOSIS — I10 Essential (primary) hypertension: Secondary | ICD-10-CM

## 2019-08-31 DIAGNOSIS — L739 Follicular disorder, unspecified: Secondary | ICD-10-CM | POA: Diagnosis not present

## 2019-08-31 DIAGNOSIS — R7303 Prediabetes: Secondary | ICD-10-CM

## 2019-08-31 DIAGNOSIS — J069 Acute upper respiratory infection, unspecified: Secondary | ICD-10-CM

## 2019-08-31 MED ORDER — CLINDAMYCIN PHOSPHATE 1 % EX LOTN: TOPICAL_LOTION | Freq: Two times a day (BID) | CUTANEOUS | 2 refills | Status: DC

## 2019-08-31 NOTE — Telephone Encounter (Signed)
Patient came to the office to be seen by dr . Etter Sjogren , he stated he had a cough , he is apart of a case study with [employer] , that he has to send in a covid swab. Inform patient it has to be a mychart visit , due to his pending results & cough . Patient was very upset, and walked out of the office. As per Estill Bamberg was told to make a mychart visit even though patient has been vaccinated .

## 2019-08-31 NOTE — Assessment & Plan Note (Signed)
Lab Results  Component Value Date   HGBA1C 5.3 06/18/2019   On metformin con't healthy weight and wellness

## 2019-08-31 NOTE — Telephone Encounter (Signed)
Patient stated " solution "I s fine even though the medication is supposed to go on the back of his head.

## 2019-08-31 NOTE — Telephone Encounter (Signed)
Caller: [facility]   clindamycin (CLEOCIN-T) 1 % lotion [[id number]]    Per Pharmacy [facility] does not carry the lotion form of medication. However, they have the solution  Please Advise if the pharmacy can change to solution.

## 2019-08-31 NOTE — Assessment & Plan Note (Signed)
con't flonase mucinex Pt did not want to do more than that at this time Consider z pack and cxr if no better next week

## 2019-08-31 NOTE — Assessment & Plan Note (Signed)
Lab Results  Component Value Date   CHOL 136 03/02/2019   HDL 44.30 03/02/2019   LDLCALC 78 03/02/2019   TRIG 68.0 03/02/2019   CHOLHDL 3 03/02/2019  Encouraged heart healthy diet, increase exercise, avoid trans fats, consider a krill oil cap daily

## 2019-08-31 NOTE — Telephone Encounter (Signed)
Ok to give solution

## 2019-08-31 NOTE — Telephone Encounter (Signed)
Please ask the pt --- i'm pretty sure he wants the lotions

## 2019-08-31 NOTE — Telephone Encounter (Signed)
Noted  

## 2019-08-31 NOTE — Progress Notes (Signed)
Virtual Visit via Video Note  I connected with Eric [name] on 08/31/19 at  8:00 AM EDT by a video enabled telemedicine application and verified that I am speaking with the correct person using two identifiers.  Location: Patient: in car in parking lot alone Provider: office    I discussed the limitations of evaluation and management by telemedicine and the availability of in person appointments. The patient expressed understanding and agreed to proceed.   History of Present Illness: Pt is in his car   pt is still going to healthy weight and wellness --- pt has lost about 60 lbs  He has a covid test pending due to some congestion.  He is in the [employer] study for vaccine.  They had him send off a nasal swab and he does not not the results yet.  No fever.   + prod cough Observations/Objective: There were no vitals filed for this visit. bp has been fine at home per pt  He is in his car in the parking lot Pt is in nad No SOB  Assessment and Plan: 1. Folliculitis Improved con't med - clindamycin (CLEOCIN-T) 1 % lotion; Apply topically 2 (two) times daily.  Dispense: 60 mL; Refill: 2  2. Prediabetes con't healthy weight and wellness con't metformin  Lab Results  Component Value Date   HGBA1C 5.3 06/18/2019     3. Morbid obesity ([place]) con't healthy weight and wellness Pt has lost almost 60 lbs   4. Viral upper respiratory tract infection con't flonase mucinex prn Pt did not want to do more at this time Consider z pack and cxr if no better next week   5. Hyperlipidemia, unspecified hyperlipidemia type Encouraged heart healthy diet, increase exercise, avoid trans fats, consider a krill oil cap daily  6. Essential hypertension Well controlled, no changes to meds. Encouraged heart healthy diet such as the DASH diet and exercise as tolerated.    Follow Up Instructions:    I discussed the assessment and treatment plan with the patient. The patient was provided an  opportunity to ask questions and all were answered. The patient agreed with the plan and demonstrated an understanding of the instructions.   The patient w30s advised to call back or seek an in-person evaluation if the symptoms worsen or if the condition fails to improve as anticipated.  I provided 30 minutes of non-face-to-face time during this encounter.   [name], DO

## 2019-08-31 NOTE — Assessment & Plan Note (Signed)
con't healthy weight and wellness Labs in 2 weeks with them

## 2019-08-31 NOTE — Assessment & Plan Note (Signed)
Well controlled, no changes to meds. Encouraged heart healthy diet such as the DASH diet and exercise as tolerated.  °

## 2019-08-31 NOTE — Telephone Encounter (Signed)
Okay to change? Please advise

## 2019-08-31 NOTE — Assessment & Plan Note (Signed)
con't cleocin

## 2019-09-03 NOTE — Telephone Encounter (Signed)
Solution faxed back

## 2019-09-17 ENCOUNTER — Encounter: Payer: Self-pay | Admitting: Family Medicine

## 2019-09-17 NOTE — Telephone Encounter (Signed)
Wow--- that took forever.  How are you feeling?

## 2019-09-19 ENCOUNTER — Encounter (INDEPENDENT_AMBULATORY_CARE_PROVIDER_SITE_OTHER): Payer: Self-pay | Admitting: Family Medicine

## 2019-09-19 ENCOUNTER — Ambulatory Visit (INDEPENDENT_AMBULATORY_CARE_PROVIDER_SITE_OTHER): Payer: Medicare Other | Admitting: Family Medicine

## 2019-09-19 ENCOUNTER — Other Ambulatory Visit: Payer: Self-pay

## 2019-09-19 VITALS — BP 151/91 | HR 54 | Temp 97.7°F | Ht 68.0 in | Wt 229.0 lb

## 2019-09-19 DIAGNOSIS — F3289 Other specified depressive episodes: Secondary | ICD-10-CM | POA: Diagnosis not present

## 2019-09-19 DIAGNOSIS — E669 Obesity, unspecified: Secondary | ICD-10-CM

## 2019-09-19 DIAGNOSIS — Z6834 Body mass index (BMI) 34.0-34.9, adult: Secondary | ICD-10-CM

## 2019-09-19 DIAGNOSIS — E8881 Metabolic syndrome: Secondary | ICD-10-CM | POA: Diagnosis not present

## 2019-09-19 DIAGNOSIS — E559 Vitamin D deficiency, unspecified: Secondary | ICD-10-CM | POA: Diagnosis not present

## 2019-09-19 MED ORDER — METFORMIN HCL 500 MG PO TABS: 500.0000 mg | ORAL_TABLET | Freq: Every day | ORAL | 0 refills | Status: DC

## 2019-09-19 MED ORDER — BUPROPION HCL ER (SR) 150 MG PO TB12: 150.0000 mg | ORAL_TABLET | Freq: Every day | ORAL | 0 refills | Status: DC

## 2019-09-19 MED ORDER — VITAMIN D (ERGOCALCIFEROL) 1.25 MG (50000 UNIT) PO CAPS: 50000.0000 [IU] | ORAL_CAPSULE | ORAL | 0 refills | Status: DC

## 2019-09-25 NOTE — Progress Notes (Signed)
Chief Complaint:   OBESITY Eric [name] is here to discuss his progress with his obesity treatment plan along with follow-up of his obesity related diagnoses. Eric [name] is on the Category 3 Plan and states he is following his eating plan approximately 80% of the time. Eric [name] states he is active while pulling weeds, cutting grass, and fishing.  Today's visit was #: 30 Starting weight: 288 lbs Starting date: 12/14/2017 Today's weight: 229 lbs Today's date: 09/19/2019 Total lbs lost to date: 59 Total lbs lost since last in-office visit: 0  Interim History: Ardon has done well maintaining his weight even on vacation. He is mindful of his food choices, and he is following his Category 3 plan whenever he can.  Subjective:   1. Vitamin D deficiency Eric [name] is stable on Vit D, and he denies nausea or vomiting.  2. Insulin resistance Eric [name] is doing well with diet, exercise, and weight loss. He denies hypoglycemia.  3. Other depression with emotional eating Eric [name] is stable on his medications. His blood pressure is elevated today, but this is due to not taking his morning medications.  Assessment/Plan:   1. Vitamin D deficiency Low Vitamin D level contributes to fatigue and are associated with obesity, breast, and colon cancer. We will refill prescription Vitamin D for 1 month. Eric [name] will follow-up for routine testing of Vitamin D, at least 2-3 times per year to avoid over-replacement.  - Vitamin D, Ergocalciferol, (DRISDOL) 1.25 MG (50000 UNIT) CAPS capsule; Take 1 capsule (50,000 Units total) by mouth every 7 (seven) days.  Dispense: 4 capsule; Refill: 0  2. Insulin resistance Eric [name] will continue to work on weight loss, exercise, and decreasing simple carbohydrates to help decrease the risk of diabetes. we will refill metformin for 1 month. Eric [name] agreed to follow-up with [place] as directed to closely monitor his progress.  - metFORMIN (GLUCOPHAGE) 500 MG tablet; Take 1 tablet (500  mg total) by mouth daily with breakfast.  Dispense: 30 tablet; Refill: 0  3. Other depression with emotional eating Behavior modification techniques were discussed today to help Eric [name] deal with his emotional/non-hunger eating behaviors. We will refill Wellbutrin SR for 1 month. Orders and follow up as documented in patient record.   - buPROPion (WELLBUTRIN SR) 150 MG 12 hr tablet; Take 1 tablet (150 mg total) by mouth daily.  Dispense: 30 tablet; Refill: 0  4. Class 1 obesity with serious comorbidity and body mass index (BMI) of 34.0 to 34.9 in adult, unspecified obesity type Eric [name] is currently in the action stage of change. As such, his goal is to continue with weight loss efforts. He has agreed to the Category 3 Plan.   Exercise goals: As is.  Behavioral modification strategies: meal planning and cooking strategies.  Eric [name] has agreed to follow-up with our clinic in 3 to 4 weeks. He was informed of the importance of frequent follow-up visits to maximize his success with intensive lifestyle modifications for his multiple health conditions.   Objective:   Blood pressure (!) 151/91, pulse (!) 54, temperature 97.7 F (36.5 C), temperature source Oral, height 5\' 8"  (1.727 m), weight 229 lb (103.9 kg), SpO2 98 %. Body mass index is 34.82 kg/m.  General: Cooperative, alert, well developed, in no acute distress. HEENT: Conjunctivae and lids unremarkable. Cardiovascular: Regular rhythm.  Lungs: Normal work of breathing. Neurologic: No focal deficits.   Lab Results  Component Value Date   CREATININE 1.20 06/18/2019   BUN 15 06/18/2019   NA 141 06/18/2019  K 5.2 06/18/2019   CL 103 06/18/2019   CO2 23 06/18/2019   Lab Results  Component Value Date   ALT 15 06/18/2019   AST 14 06/18/2019   ALKPHOS 75 06/18/2019   BILITOT 0.6 06/18/2019   Lab Results  Component Value Date   HGBA1C 5.3 06/18/2019   HGBA1C 5.5 03/02/2019   HGBA1C 5.6 08/29/2018   HGBA1C 5.4 03/09/2018    HGBA1C 5.7 (H) 12/14/2017   Lab Results  Component Value Date   INSULIN 21.3 12/14/2017   Lab Results  Component Value Date   TSH 1.060 12/14/2017   Lab Results  Component Value Date   CHOL 136 03/02/2019   HDL 44.30 03/02/2019   LDLCALC 78 03/02/2019   TRIG 68.0 03/02/2019   CHOLHDL 3 03/02/2019   Lab Results  Component Value Date   WBC 12.8 (H) 09/26/2018   HGB 13.4 09/26/2018   HCT 41.8 09/26/2018   MCV 95.4 09/26/2018   PLT 168 09/26/2018   No results found for: IRON, TIBC, FERRITIN  Obesity Behavioral Intervention Documentation for Insurance:   Approximately 15 minutes were spent on the discussion below.  ASK: We discussed the diagnosis of obesity with Eric [name] today and Eric [name] agreed to give [place] permission to discuss obesity behavioral modification therapy today.  ASSESS: Eric [name] has the diagnosis of obesity and his BMI today is 34.83. Eric [name] is in the action stage of change.   ADVISE: Eric [name] was educated on the multiple health risks of obesity as well as the benefit of weight loss to improve his health. He was advised of the need for long term treatment and the importance of lifestyle modifications to improve his current health and to decrease his risk of future health problems.  AGREE: Multiple dietary modification options and treatment options were discussed and Eric [name] agreed to follow the recommendations documented in the above note.  ARRANGE: Eric [name] was educated on the importance of frequent visits to treat obesity as outlined per CMS and USPSTF guidelines and agreed to schedule his next follow up appointment today.  Attestation Statements:   Reviewed by clinician on day of visit: allergies, medications, problem list, medical history, surgical history, family history, social history, and previous encounter notes.   I, Trixie Dredge, am acting as transcriptionist for [name], MD.  I have reviewed the above documentation for accuracy and  completeness, and I agree with the above. -  [name], MD

## 2019-10-01 ENCOUNTER — Ambulatory Visit (INDEPENDENT_AMBULATORY_CARE_PROVIDER_SITE_OTHER): Payer: Medicare Other | Admitting: Family Medicine

## 2019-10-07 ENCOUNTER — Encounter (INDEPENDENT_AMBULATORY_CARE_PROVIDER_SITE_OTHER): Payer: Self-pay | Admitting: Family Medicine

## 2019-10-08 NOTE — Telephone Encounter (Signed)
Please see

## 2019-10-22 ENCOUNTER — Other Ambulatory Visit: Payer: Self-pay

## 2019-10-22 ENCOUNTER — Encounter (INDEPENDENT_AMBULATORY_CARE_PROVIDER_SITE_OTHER): Payer: Self-pay | Admitting: Family Medicine

## 2019-10-22 ENCOUNTER — Ambulatory Visit (INDEPENDENT_AMBULATORY_CARE_PROVIDER_SITE_OTHER): Payer: Medicare Other | Admitting: Family Medicine

## 2019-10-22 VITALS — BP 109/71 | HR 49 | Temp 97.9°F | Ht 68.0 in | Wt 233.0 lb

## 2019-10-22 DIAGNOSIS — E7849 Other hyperlipidemia: Secondary | ICD-10-CM

## 2019-10-22 DIAGNOSIS — R7303 Prediabetes: Secondary | ICD-10-CM

## 2019-10-22 DIAGNOSIS — E559 Vitamin D deficiency, unspecified: Secondary | ICD-10-CM | POA: Diagnosis not present

## 2019-10-22 DIAGNOSIS — F3289 Other specified depressive episodes: Secondary | ICD-10-CM | POA: Diagnosis not present

## 2019-10-22 DIAGNOSIS — Z6835 Body mass index (BMI) 35.0-35.9, adult: Secondary | ICD-10-CM

## 2019-10-22 MED ORDER — VITAMIN D (ERGOCALCIFEROL) 1.25 MG (50000 UNIT) PO CAPS: 50000.0000 [IU] | ORAL_CAPSULE | ORAL | 0 refills | Status: DC

## 2019-10-22 MED ORDER — BUPROPION HCL ER (SR) 150 MG PO TB12: 150.0000 mg | ORAL_TABLET | Freq: Every day | ORAL | 0 refills | Status: DC

## 2019-10-22 NOTE — Progress Notes (Signed)
Chief Complaint:   OBESITY Eric [name] is here to discuss his progress with his obesity treatment plan along with follow-up of his obesity related diagnoses. Eric [name] is on the Category 3 Plan and states he is following his eating plan approximately 50% of the time. Eric [name] states he is doing yard work.  Today's visit was #: 75 Starting weight: 288 lbs Starting date: 12/14/2017 Today's weight: 233 lbs Today's date: 10/22/2019 Total lbs lost to date: 55 Total lbs lost since last in-office visit: 0  Interim History: Eric [name] was on vacation and he did some celebration eating. He is now getting back on track and working on decreasing eating out.  Subjective:   1. Vitamin D deficiency Alando is on Vit D, and he is due for labs. He denies nausea, vomiting, or muscle weakness.  2. Pre-diabetes Larkin's last A1c was well controlled on his eating plan and metformin. He is due for labs.  3. Other hyperlipidemia Hakiem is on statin and CoQ10. He denies chest pain or myalgias, and he is due for labs.  4. Other depression with emotional eating Tyreak's mood is stable, and his blood pressure is well controlled. He is sleeping well.  Assessment/Plan:   1. Vitamin D deficiency Low Vitamin D level contributes to fatigue and are associated with obesity, breast, and colon cancer. We will check labs today, and we will refill prescription Vitamin D for 1 month. Eric [name] will follow-up for routine testing of Vitamin D, at least 2-3 times per year to avoid over-replacement.  - Vitamin D, Ergocalciferol, (DRISDOL) 1.25 MG (50000 UNIT) CAPS capsule; Take 1 capsule (50,000 Units total) by mouth every 7 (seven) days.  Dispense: 4 capsule; Refill: 0 - VITAMIN D 25 Hydroxy (Vit-D Deficiency, Fractures)  2. Pre-diabetes Eric [name] will continue to work on weight loss, exercise, and decreasing simple carbohydrates to help decrease the risk of diabetes. [name] will continue metformin and we will check labs  today.  - Hemoglobin A1c - Insulin, random  3. Other hyperlipidemia Cardiovascular risk and specific lipid/LDL goals reviewed. We discussed several lifestyle modifications today. Eric [name] will continue Lipitor and CoQ10, and will continue to work on diet, exercise and weight loss efforts. We will check labs today. Orders and follow up as documented in patient record.   - Comprehensive metabolic panel - Lipid Panel With LDL/HDL Ratio  4. Other depression with emotional eating Behavior modification techniques were discussed today to help Eric [name] deal with his emotional/non-hunger eating behaviors. We will refill Wellbutrin SR for 1 month. Orders and follow up as documented in patient record.   - buPROPion (WELLBUTRIN SR) 150 MG 12 hr tablet; Take 1 tablet (150 mg total) by mouth daily.  Dispense: 30 tablet; Refill: 0  5. Class 2 severe obesity with serious comorbidity and body mass index (BMI) of 35.0 to 35.9 in adult, unspecified obesity type [name]) Eric [name] is currently in the action stage of change. As such, his goal is to continue with weight loss efforts. He has agreed to the Category 3 Plan.   Exercise goals: As is.  Behavioral modification strategies: increasing lean protein intake and meal planning and cooking strategies.  Eric [name] has agreed to follow-up with our clinic in 4 weeks. He was informed of the importance of frequent follow-up visits to maximize his success with intensive lifestyle modifications for his multiple health conditions.   Eric [name] was informed we would discuss his lab results at his next visit unless there is a critical issue that needs to be addressed  sooner. Eric [name] agreed to keep his next visit at the agreed upon time to discuss these results.  Objective:   Blood pressure 109/71, pulse 49, temperature 97.9 F (36.6 C), temperature source Oral, height 5\' 8"  (1.727 m), weight (!) 233 lb (105.7 kg), SpO2 97 %. Body mass index is 35.43 kg/m.  General:  Cooperative, alert, well developed, in no acute distress. HEENT: Conjunctivae and lids unremarkable. Cardiovascular: Regular rhythm.  Lungs: Normal work of breathing. Neurologic: No focal deficits.   Lab Results  Component Value Date   CREATININE 1.20 06/18/2019   BUN 15 06/18/2019   NA 141 06/18/2019   K 5.2 06/18/2019   CL 103 06/18/2019   CO2 23 06/18/2019   Lab Results  Component Value Date   ALT 15 06/18/2019   AST 14 06/18/2019   ALKPHOS 75 06/18/2019   BILITOT 0.6 06/18/2019   Lab Results  Component Value Date   HGBA1C 5.3 06/18/2019   HGBA1C 5.5 03/02/2019   HGBA1C 5.6 08/29/2018   HGBA1C 5.4 03/09/2018   HGBA1C 5.7 (H) 12/14/2017   Lab Results  Component Value Date   INSULIN 21.3 12/14/2017   Lab Results  Component Value Date   TSH 1.060 12/14/2017   Lab Results  Component Value Date   CHOL 136 03/02/2019   HDL 44.30 03/02/2019   LDLCALC 78 03/02/2019   TRIG 68.0 03/02/2019   CHOLHDL 3 03/02/2019   Lab Results  Component Value Date   WBC 12.8 (H) 09/26/2018   HGB 13.4 09/26/2018   HCT 41.8 09/26/2018   MCV 95.4 09/26/2018   PLT 168 09/26/2018   No results found for: IRON, TIBC, FERRITIN  Obesity Behavioral Intervention Documentation for Insurance:   Approximately 15 minutes were spent on the discussion below.  ASK: We discussed the diagnosis of obesity with Eric [name] today and Eric [name] agreed to give Eric [name] permission to discuss obesity behavioral modification therapy today.  ASSESS: [name] has the diagnosis of obesity and his BMI today is 35.44. [name] is in the action stage of change.   ADVISE: Eric [name] was educated on the multiple health risks of obesity as well as the benefit of weight loss to improve his health. He was advised of the need for long term treatment and the importance of lifestyle modifications to improve his current health and to decrease his risk of future health problems.  AGREE: Multiple dietary modification options and  treatment options were discussed and Eric [name] agreed to follow the recommendations documented in the above note.  ARRANGE: Eric [name] was educated on the importance of frequent visits to treat obesity as outlined per CMS and USPSTF guidelines and agreed to schedule his next follow up appointment today.  Attestation Statements:   Reviewed by clinician on day of visit: allergies, medications, problem list, medical history, surgical history, family history, social history, and previous encounter notes.   I, Trixie Dredge, am acting as transcriptionist for [name], MD.  I have reviewed the above documentation for accuracy and completeness, and I agree with the above. -  [name], MD

## 2019-10-23 LAB — COMPREHENSIVE METABOLIC PANEL
ALT: 18 IU/L (ref 0–44)
AST: 15 IU/L (ref 0–40)
Albumin/Globulin Ratio: 2 (ref 1.2–2.2)
Albumin: 4.3 g/dL (ref 3.7–4.7)
Alkaline Phosphatase: 76 IU/L (ref 48–121)
BUN/Creatinine Ratio: 14 (ref 10–24)
BUN: 16 mg/dL (ref 8–27)
Bilirubin Total: 0.7 mg/dL (ref 0.0–1.2)
CO2: 26 mmol/L (ref 20–29)
Calcium: 9.2 mg/dL (ref 8.6–10.2)
Chloride: 102 mmol/L (ref 96–106)
Creatinine, Ser: 1.13 mg/dL (ref 0.76–1.27)
GFR calc Af Amer: 74 mL/min/{1.73_m2} (ref >59)
GFR calc non Af Amer: 64 mL/min/{1.73_m2} (ref >59)
Globulin, Total: 2.2 g/dL (ref 1.5–4.5)
Glucose: 86 mg/dL (ref 65–99)
Potassium: 4.5 mmol/L (ref 3.5–5.2)
Sodium: 139 mmol/L (ref 134–144)
Total Protein: 6.5 g/dL (ref 6.0–8.5)

## 2019-10-23 LAB — LIPID PANEL WITH LDL/HDL RATIO
Cholesterol, Total: 137 mg/dL (ref 100–199)
HDL: 50 mg/dL (ref >39)
LDL Chol Calc (NIH): 73 mg/dL (ref 0–99)
LDL/HDL Ratio: 1.5 ratio (ref 0.0–3.6)
Triglycerides: 66 mg/dL (ref 0–149)
VLDL Cholesterol Cal: 14 mg/dL (ref 5–40)

## 2019-10-23 LAB — INSULIN, RANDOM: INSULIN: 9 u[IU]/mL (ref 2.6–24.9)

## 2019-10-23 LAB — HEMOGLOBIN A1C
Est. average glucose Bld gHb Est-mCnc: 114 mg/dL
Hgb A1c MFr Bld: 5.6 % (ref 4.8–5.6)

## 2019-10-23 LAB — VITAMIN D 25 HYDROXY (VIT D DEFICIENCY, FRACTURES): Vit D, 25-Hydroxy: 55.6 ng/mL (ref 30.0–100.0)

## 2019-10-26 ENCOUNTER — Other Ambulatory Visit: Payer: Self-pay | Admitting: Family Medicine

## 2019-10-26 DIAGNOSIS — K219 Gastro-esophageal reflux disease without esophagitis: Secondary | ICD-10-CM

## 2019-11-15 ENCOUNTER — Other Ambulatory Visit (INDEPENDENT_AMBULATORY_CARE_PROVIDER_SITE_OTHER): Payer: Self-pay | Admitting: Family Medicine

## 2019-11-15 ENCOUNTER — Encounter (INDEPENDENT_AMBULATORY_CARE_PROVIDER_SITE_OTHER): Payer: Self-pay

## 2019-11-15 DIAGNOSIS — E8881 Metabolic syndrome: Secondary | ICD-10-CM

## 2019-11-15 NOTE — Telephone Encounter (Signed)
My chart message sent to pt.

## 2019-11-27 ENCOUNTER — Ambulatory Visit (INDEPENDENT_AMBULATORY_CARE_PROVIDER_SITE_OTHER): Payer: Medicare Other | Admitting: Family Medicine

## 2019-11-27 ENCOUNTER — Other Ambulatory Visit: Payer: Self-pay

## 2019-11-27 ENCOUNTER — Encounter (INDEPENDENT_AMBULATORY_CARE_PROVIDER_SITE_OTHER): Payer: Self-pay | Admitting: Family Medicine

## 2019-11-27 VITALS — BP 117/64 | HR 52 | Temp 97.9°F | Ht 68.0 in | Wt 233.0 lb

## 2019-11-27 DIAGNOSIS — R7303 Prediabetes: Secondary | ICD-10-CM

## 2019-11-27 DIAGNOSIS — Z6835 Body mass index (BMI) 35.0-35.9, adult: Secondary | ICD-10-CM

## 2019-11-27 DIAGNOSIS — F3289 Other specified depressive episodes: Secondary | ICD-10-CM | POA: Diagnosis not present

## 2019-11-27 DIAGNOSIS — E559 Vitamin D deficiency, unspecified: Secondary | ICD-10-CM | POA: Diagnosis not present

## 2019-11-27 MED ORDER — VITAMIN D (ERGOCALCIFEROL) 1.25 MG (50000 UNIT) PO CAPS: 50000.0000 [IU] | ORAL_CAPSULE | ORAL | 0 refills | Status: DC

## 2019-11-27 MED ORDER — BUPROPION HCL ER (SR) 200 MG PO TB12: 200.0000 mg | ORAL_TABLET | Freq: Every day | ORAL | 0 refills | Status: DC

## 2019-11-27 MED ORDER — METFORMIN HCL 500 MG PO TABS: 500.0000 mg | ORAL_TABLET | Freq: Every day | ORAL | 0 refills | Status: DC

## 2019-11-28 ENCOUNTER — Encounter (INDEPENDENT_AMBULATORY_CARE_PROVIDER_SITE_OTHER): Payer: Self-pay | Admitting: Family Medicine

## 2019-11-28 NOTE — Progress Notes (Signed)
Chief Complaint:   OBESITY Eric [name] is here to discuss his progress with his obesity treatment plan along with follow-up of his obesity related diagnoses. Eric [name] is on the Category 3 Plan and states he is following his eating plan approximately 50% of the time. Eric [name] states he is walking 1/2 mile and doing yard work 2-3 times per week.  Today's visit was #: 85 Starting weight: 288 lbs Starting date: 12/14/2017 Today's weight: 233 lbs Today's date: 11/27/2019 Total lbs lost to date: 55 Total lbs lost since last in-office visit: 0  Interim History: Eric [name] has done well maintaining his weight even with increased traveling and eating out. He is ready to get back on track with his eating plan, but he would like more options for dinner.  Subjective:   1. Pre-diabetes Eric [name] is stable on metformin, and he denies nausea or vomiting. He requests a refill today.  2. Vitamin D deficiency Eric [name] is stable on Vit D, and he denies nausea or vomiting.  3. Other depression with emotional eating Eric [name] still struggles with emotional eating and cravings. He feels the Wellbutrin isn't helping much. He has a mild essential tremor which may have worsened, but he isn't completely sure.  Assessment/Plan:   1. Pre-diabetes Dom will continue to work on weight loss, diet, exercise, and decreasing simple carbohydrates to help decrease the risk of diabetes.  We will refill metformin for 1 month.  - metFORMIN (GLUCOPHAGE) 500 MG tablet; Take 1 tablet (500 mg total) by mouth daily with breakfast.  Dispense: 30 tablet; Refill: 0  2. Vitamin D deficiency Low Vitamin D level contributes to fatigue and are associated with obesity, breast, and colon cancer. We will refill prescription Vitamin D for 1 month. Saquan will follow-up for routine testing of Vitamin D, at least 2-3 times per year to avoid over-replacement.  - Vitamin D, Ergocalciferol, (DRISDOL) 1.25 MG (50000 UNIT) CAPS capsule; Take 1  capsule (50,000 Units total) by mouth every 7 (seven) days.  Dispense: 4 capsule; Refill: 0  3. Other depression with emotional eating Behavior modification techniques were discussed today to help Eric [name] deal with his emotional/non-hunger eating behaviors. Eric [name] agreed to increase Wellbutrin SR to 200 mg q AM with no refills, and will watch for worsening tremor. Orders and follow up as documented in patient record.   - buPROPion (WELLBUTRIN SR) 200 MG 12 hr tablet; Take 1 tablet (200 mg total) by mouth daily.  Dispense: 30 tablet; Refill: 0  4. Class 2 severe obesity with serious comorbidity and body mass index (BMI) of 35.0 to 35.9 in adult, unspecified obesity type [name]) Eric [name] is currently in the action stage of change. As such, his goal is to continue with weight loss efforts. He has agreed to the Category 3 Plan and keeping a food journal and adhering to recommended goals of 400-600 calories and 40+ grams of protein at supper daily.   Exercise goals: As is.  Behavioral modification strategies: increasing lean protein intake and meal planning and cooking strategies.  Eric [name] has agreed to follow-up with our clinic in 3 weeks. He was informed of the importance of frequent follow-up visits to maximize his success with intensive lifestyle modifications for his multiple health conditions.   Objective:   Blood pressure 117/64, pulse (!) 52, temperature 97.9 F (36.6 C), height 5\' 8"  (1.727 m), weight 233 lb (105.7 kg), SpO2 97 %. Body mass index is 35.43 kg/m.  General: Cooperative, alert, well developed, in no acute distress. HEENT: Conjunctivae  and lids unremarkable. Cardiovascular: Regular rhythm.  Lungs: Normal work of breathing. Neurologic: No focal deficits.   Lab Results  Component Value Date   CREATININE 1.13 10/22/2019   BUN 16 10/22/2019   NA 139 10/22/2019   K 4.5 10/22/2019   CL 102 10/22/2019   CO2 26 10/22/2019   Lab Results  Component Value Date   ALT 18  10/22/2019   AST 15 10/22/2019   ALKPHOS 76 10/22/2019   BILITOT 0.7 10/22/2019   Lab Results  Component Value Date   HGBA1C 5.6 10/22/2019   HGBA1C 5.3 06/18/2019   HGBA1C 5.5 03/02/2019   HGBA1C 5.6 08/29/2018   HGBA1C 5.4 03/09/2018   Lab Results  Component Value Date   INSULIN 9.0 10/22/2019   INSULIN 21.3 12/14/2017   Lab Results  Component Value Date   TSH 1.060 12/14/2017   Lab Results  Component Value Date   CHOL 137 10/22/2019   HDL 50 10/22/2019   LDLCALC 73 10/22/2019   TRIG 66 10/22/2019   CHOLHDL 3 03/02/2019   Lab Results  Component Value Date   WBC 12.8 (H) 09/26/2018   HGB 13.4 09/26/2018   HCT 41.8 09/26/2018   MCV 95.4 09/26/2018   PLT 168 09/26/2018   No results found for: IRON, TIBC, FERRITIN  Obesity Behavioral Intervention:   Approximately 15 minutes were spent on the discussion below.  ASK: We discussed the diagnosis of obesity with Eric [name] today and Yousof agreed to give [place] permission to discuss obesity behavioral modification therapy today.  ASSESS: Eric [name] has the diagnosis of obesity and his BMI today is 35.44. Eric [name] is in the action stage of change.   ADVISE: Sylvain was educated on the multiple health risks of obesity as well as the benefit of weight loss to improve his health. He was advised of the need for long term treatment and the importance of lifestyle modifications to improve his current health and to decrease his risk of future health problems.  AGREE: Multiple dietary modification options and treatment options were discussed and Eric [name] agreed to follow the recommendations documented in the above note.  ARRANGE: Eric [name] was educated on the importance of frequent visits to treat obesity as outlined per CMS and USPSTF guidelines and agreed to schedule his next follow up appointment today.  Attestation Statements:   Reviewed by clinician on day of visit: allergies, medications, problem list, medical history, surgical  history, family history, social history, and previous encounter notes.   I, Trixie Dredge, am acting as transcriptionist for [name], MD.  I have reviewed the above documentation for accuracy and completeness, and I agree with the above. -  [name], MD

## 2019-11-29 NOTE — Telephone Encounter (Signed)
Call to pharmacy spoke with Shanon Brow.  He states that the Wellbutrin 200 mg was put on file when it was sent in.  Requested that he fill the prescription and cancel the Wellbutrin  150 mg.

## 2019-12-18 ENCOUNTER — Encounter (INDEPENDENT_AMBULATORY_CARE_PROVIDER_SITE_OTHER): Payer: Self-pay | Admitting: Family Medicine

## 2019-12-18 ENCOUNTER — Ambulatory Visit (INDEPENDENT_AMBULATORY_CARE_PROVIDER_SITE_OTHER): Payer: Medicare Other | Admitting: Family Medicine

## 2019-12-18 ENCOUNTER — Other Ambulatory Visit: Payer: Self-pay

## 2019-12-18 VITALS — BP 116/66 | HR 51 | Temp 98.0°F | Ht 68.0 in | Wt 232.0 lb

## 2019-12-18 DIAGNOSIS — E538 Deficiency of other specified B group vitamins: Secondary | ICD-10-CM

## 2019-12-18 DIAGNOSIS — Z6835 Body mass index (BMI) 35.0-35.9, adult: Secondary | ICD-10-CM

## 2019-12-18 DIAGNOSIS — F3289 Other specified depressive episodes: Secondary | ICD-10-CM | POA: Diagnosis not present

## 2019-12-18 DIAGNOSIS — E559 Vitamin D deficiency, unspecified: Secondary | ICD-10-CM

## 2019-12-18 MED ORDER — BUPROPION HCL ER (SR) 200 MG PO TB12: 200.0000 mg | ORAL_TABLET | Freq: Every day | ORAL | 0 refills | Status: DC

## 2019-12-18 MED ORDER — VITAMIN D (ERGOCALCIFEROL) 1.25 MG (50000 UNIT) PO CAPS: 50000.0000 [IU] | ORAL_CAPSULE | ORAL | 0 refills | Status: DC

## 2019-12-18 NOTE — Progress Notes (Signed)
Chief Complaint:   OBESITY Deno is here to discuss his progress with his obesity treatment plan along with follow-up of his obesity related diagnoses. Suhayb is on the Category 3 Plan and keeping a food journal and adhering to recommended goals of 400-600 calories and 40+ grams of protein at supper daily and states he is following his eating plan approximately 80% of the time. Kacyn states he is walking and doing yard work for 20 minutes 2 times per week.  Today's visit was #: 80 Starting weight: 288 lbs Starting date: 12/14/2017 Today's weight: 232 lbs Today's date: 12/18/2019 Total lbs lost to date: 56 Total lbs lost since last in-office visit: 1  Interim History: Elya has done well with weight loss even with traveling and eating out. He is working on [employer] protein and doing yard work for exercise.  Subjective:   1. Vitamin D deficiency Kyle sis stable on Vit D, and he requests a refill today.  2. B12 deficiency Crosley is at risk of B12 deficiency due to medications, and he due for labs.  3. Other depression with emotional eating Arcangel's dose of Wellbutrin and was increased to 200 mg, but he was given his 150 mg dose at the pharmacy and so he took 2 tablets. He is now on his 200 mg dose and he thinks the higher dose may have worsened his essential tremor.  Assessment/Plan:   1. Vitamin D deficiency Low Vitamin D level contributes to fatigue and are associated with obesity, breast, and colon cancer. We will refill prescription Vitamin D for 1 month. Tyrique will follow-up for routine testing of Vitamin D, at least 2-3 times per year to avoid over-replacement.  - Vitamin D, Ergocalciferol, (DRISDOL) 1.25 MG (50000 UNIT) CAPS capsule; Take 1 capsule (50,000 Units total) by mouth every 7 (seven) days.  Dispense: 4 capsule; Refill: 0  2. B12 deficiency The diagnosis was reviewed with the patient. We will continue to monitor. We will check B12 today, and  Amer will continue his B12 rich diet. Orders and follow up as documented in patient record.  - Vitamin B12  3. Other depression with emotional eating Behavior modification techniques were discussed today to help Tayquan deal with his emotional/non-hunger eating behaviors. We will refill Wellbutrin SR 200 mg q daily for 1 month. Orders and follow up as documented in patient record.   4. Class 2 severe obesity with serious comorbidity and body mass index (BMI) of 35.0 to 35.9 in adult, unspecified obesity type [facility]) Jquan is currently in the action stage of change. As such, his goal is to continue with weight loss efforts. He has agreed to the Category 3 Plan.   Exercise goals: As is.  Behavioral modification strategies: increasing lean protein intake.  Carly has agreed to follow-up with our clinic in 3 weeks. He was informed of the importance of frequent follow-up visits to maximize his success with intensive lifestyle modifications for his multiple health conditions.   Levell was informed we would discuss his lab results at his next visit unless there is a critical issue that needs to be addressed sooner. Yahia agreed to keep his next visit at the agreed upon time to discuss these results.  Objective:   Blood pressure 116/66, pulse (!) 51, temperature 98 F (36.7 C), height 5\' 8"  (1.727 m), weight 232 lb (105.2 kg), SpO2 98 %. Body mass index is 35.28 kg/m.  General: Cooperative, alert, well developed, in no acute distress. HEENT: Conjunctivae and  lids unremarkable. Cardiovascular: Regular rhythm.  Lungs: Normal work of breathing. Neurologic: No focal deficits.   Lab Results  Component Value Date   CREATININE 1.13 10/22/2019   BUN 16 10/22/2019   NA 139 10/22/2019   K 4.5 10/22/2019   CL 102 10/22/2019   CO2 26 10/22/2019   Lab Results  Component Value Date   ALT 18 10/22/2019   AST 15 10/22/2019   ALKPHOS 76 10/22/2019   BILITOT 0.7 10/22/2019   Lab Results    Component Value Date   HGBA1C 5.6 10/22/2019   HGBA1C 5.3 06/18/2019   HGBA1C 5.5 03/02/2019   HGBA1C 5.6 08/29/2018   HGBA1C 5.4 03/09/2018   Lab Results  Component Value Date   INSULIN 9.0 10/22/2019   INSULIN 21.3 12/14/2017   Lab Results  Component Value Date   TSH 1.060 12/14/2017   Lab Results  Component Value Date   CHOL 137 10/22/2019   HDL 50 10/22/2019   LDLCALC 73 10/22/2019   TRIG 66 10/22/2019   CHOLHDL 3 03/02/2019   Lab Results  Component Value Date   WBC 12.8 (H) 09/26/2018   HGB 13.4 09/26/2018   HCT 41.8 09/26/2018   MCV 95.4 09/26/2018   PLT 168 09/26/2018   No results found for: IRON, TIBC, FERRITIN  Obesity Behavioral Intervention:   Approximately 15 minutes were spent on the discussion below.  ASK: We discussed the diagnosis of obesity with [name] today and Aadi agreed to give [place] permission to discuss obesity behavioral modification therapy today.  ASSESS: Rawlin has the diagnosis of obesity and his BMI today is 35.28. Vian is in the action stage of change.   ADVISE: Cabe was educated on the multiple health risks of obesity as well as the benefit of weight loss to improve his health. He was advised of the need for long term treatment and the importance of lifestyle modifications to improve his current health and to decrease his risk of future health problems.  AGREE: Multiple dietary modification options and treatment options were discussed and Jaysen agreed to follow the recommendations documented in the above note.  ARRANGE: Aydeen was educated on the importance of frequent visits to treat obesity as outlined per CMS and USPSTF guidelines and agreed to schedule his next follow up appointment today.  Attestation Statements:   Reviewed by clinician on day of visit: allergies, medications, problem list, medical history, surgical history, family history, social history, and previous encounter notes.   I, Trixie Dredge, am  acting as transcriptionist for [name], MD.  I have reviewed the above documentation for accuracy and completeness, and I agree with the above. -  [name], MD

## 2019-12-19 LAB — VITAMIN B12: Vitamin B-12: 461 pg/mL (ref 232–1245)

## 2019-12-21 ENCOUNTER — Encounter: Payer: Self-pay | Admitting: Family Medicine

## 2019-12-24 ENCOUNTER — Other Ambulatory Visit (INDEPENDENT_AMBULATORY_CARE_PROVIDER_SITE_OTHER): Payer: Self-pay | Admitting: Family Medicine

## 2019-12-24 DIAGNOSIS — F3289 Other specified depressive episodes: Secondary | ICD-10-CM

## 2019-12-24 DIAGNOSIS — R7303 Prediabetes: Secondary | ICD-10-CM

## 2019-12-25 ENCOUNTER — Encounter (INDEPENDENT_AMBULATORY_CARE_PROVIDER_SITE_OTHER): Payer: Self-pay

## 2019-12-25 ENCOUNTER — Other Ambulatory Visit (INDEPENDENT_AMBULATORY_CARE_PROVIDER_SITE_OTHER): Payer: Self-pay

## 2019-12-25 NOTE — Telephone Encounter (Signed)
Message sent to pt-CAS 

## 2020-01-08 ENCOUNTER — Ambulatory Visit (INDEPENDENT_AMBULATORY_CARE_PROVIDER_SITE_OTHER): Payer: Medicare Other | Admitting: Family Medicine

## 2020-01-08 ENCOUNTER — Other Ambulatory Visit: Payer: Self-pay

## 2020-01-08 ENCOUNTER — Encounter (INDEPENDENT_AMBULATORY_CARE_PROVIDER_SITE_OTHER): Payer: Self-pay | Admitting: Family Medicine

## 2020-01-08 VITALS — BP 119/71 | HR 55 | Temp 98.4°F | Ht 68.0 in | Wt 231.0 lb

## 2020-01-08 DIAGNOSIS — F3289 Other specified depressive episodes: Secondary | ICD-10-CM | POA: Diagnosis not present

## 2020-01-08 DIAGNOSIS — E559 Vitamin D deficiency, unspecified: Secondary | ICD-10-CM | POA: Diagnosis not present

## 2020-01-08 DIAGNOSIS — Z6835 Body mass index (BMI) 35.0-35.9, adult: Secondary | ICD-10-CM

## 2020-01-10 MED ORDER — VITAMIN D (ERGOCALCIFEROL) 1.25 MG (50000 UNIT) PO CAPS: 50000.0000 [IU] | ORAL_CAPSULE | ORAL | 0 refills | Status: DC

## 2020-01-10 MED ORDER — BUPROPION HCL ER (SR) 200 MG PO TB12: 200.0000 mg | ORAL_TABLET | Freq: Every day | ORAL | 0 refills | Status: DC

## 2020-01-15 NOTE — Progress Notes (Signed)
Chief Complaint:   OBESITY Eric [name] is here to discuss his progress with his obesity treatment plan along with follow-up of his obesity related diagnoses. Eric [name] is on the Category 3 Plan and states he is following his eating plan approximately 85% of the time. 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  Today's visit was #: 62 Starting weight: 288 lbs Starting date: 12/14/2017 Today's weight: 231 lbs Today's date: 01/08/2020 Total lbs lost to date: 94 Total lbs lost since last in-office visit: 1  Interim History: Eric [name] continues to do well with weight loss. He is very active  With yard work and hunting. His hunger is controlled and he is satisfied overall with his Category 3 plan.  Subjective:   1. Vitamin D deficiency Eric [name] is tolerating Vit D well with no signs of over-replacement such as nausea, vomiting, or muscle weakness.  2. Other depression with emotional eating Eric [name]'s mood is stable on his medications, and he is working on decreasing emotional eating. He is doing well with his weight loss efforts, and his blood pressure is well controlled.  Assessment/Plan:   1. Vitamin D deficiency Low Vitamin D level contributes to fatigue and are associated with obesity, breast, and colon cancer. We will refill prescription Vitamin D for 1 month. [name] will follow-up for routine testing of Vitamin D, at least 2-3 times per year to avoid over-replacement.  - Vitamin D, Ergocalciferol, (DRISDOL) 1.25 MG (50000 UNIT) CAPS capsule; Take 1 capsule (50,000 Units total) by mouth every 7 (seven) days.  Dispense: 4 capsule; Refill: 0  2. Other depression with emotional eating Behavior modification techniques were discussed today to help Eric [name] deal with his emotional/non-hunger eating behaviors. We will refill Wellbutrin SR for 1 month. Orders and follow up as documented in patient record.   - buPROPion (WELLBUTRIN SR) 200 MG 12 hr tablet;  Take 1 tablet (200 mg total) by mouth daily.  Dispense: 30 tablet; Refill: 0  3. Class 2 severe obesity with serious comorbidity and body mass index (BMI) of 35.0 to 35.9 in adult, unspecified obesity type [facility]) Eric [name] is currently in the action stage of change. As such, his goal is to continue with weight loss efforts. He has agreed to the Category 3 Plan.   Exercise goals: As is.  Behavioral modification strategies: travel eating strategies and holiday eating strategies .  Eric [name] has agreed to follow-up with our clinic in 4 weeks. He was informed of the importance of frequent follow-up visits to maximize his success with intensive lifestyle modifications for his multiple health conditions.   Objective:   Blood pressure 119/71, pulse (!) 55, temperature 98.4 F (36.9 C), height 5\' 8"  (1.727 m), weight 231 lb (104.8 kg), SpO2 94 %. Body mass index is 35.12 kg/m.  General: Cooperative, alert, well developed, in no acute distress. HEENT: Conjunctivae and lids unremarkable. Cardiovascular: Regular rhythm.  Lungs: Normal work of breathing. Neurologic: No focal deficits.   Lab Results  Component Value Date   CREATININE 1.13 10/22/2019   BUN 16 10/22/2019   NA 139 10/22/2019   K 4.5 10/22/2019   CL 102 10/22/2019   CO2 26 10/22/2019   Lab Results  Component Value Date   ALT 18 10/22/2019   AST 15 10/22/2019   ALKPHOS 76 10/22/2019   BILITOT 0.7 10/22/2019   Lab Results  Component Value Date   HGBA1C 5.6 10/22/2019   HGBA1C 5.3 06/18/2019   HGBA1C 5.5  03/02/2019   HGBA1C 5.6 08/29/2018   HGBA1C 5.4 03/09/2018   Lab Results  Component Value Date   INSULIN 9.0 10/22/2019   INSULIN 21.3 12/14/2017   Lab Results  Component Value Date   TSH 1.060 12/14/2017   Lab Results  Component Value Date   CHOL 137 10/22/2019   HDL 50 10/22/2019   LDLCALC 73 10/22/2019   TRIG 66 10/22/2019   CHOLHDL 3 03/02/2019   Lab Results  Component Value Date   WBC 12.8 (H)  09/26/2018   HGB 13.4 09/26/2018   HCT 41.8 09/26/2018   MCV 95.4 09/26/2018   PLT 168 09/26/2018   No results found for: IRON, TIBC, FERRITIN  Obesity Behavioral Intervention:   Approximately 15 minutes were spent on the discussion below.  ASK: We discussed the diagnosis of obesity with [name] today and [name] agreed to give [place] permission to discuss obesity behavioral modification therapy today.  ASSESS: Eric [name] has the diagnosis of obesity and his BMI today is 35.13. Eric [name] is in the action stage of change.   ADVISE: [name] was educated on the multiple health risks of obesity as well as the benefit of weight loss to improve his health. He was advised of the need for long term treatment and the importance of lifestyle modifications to improve his current health and to decrease his risk of future health problems.  AGREE: Multiple dietary modification options and treatment options were discussed and Eric [name] agreed to follow the recommendations documented in the above note.  ARRANGE: Eric [name] was educated on the importance of frequent visits to treat obesity as outlined per CMS and USPSTF guidelines and agreed to schedule his next follow up appointment today.  Attestation Statements:   Reviewed by clinician on day of visit: allergies, medications, problem list, medical history, surgical history, family history, social history, and previous encounter notes.   I, Trixie Dredge, am acting as transcriptionist for [name], MD.  I have reviewed the above documentation for accuracy and completeness, and I agree with the above. -  [name], MD

## 2020-01-21 ENCOUNTER — Encounter (INDEPENDENT_AMBULATORY_CARE_PROVIDER_SITE_OTHER): Payer: Self-pay

## 2020-01-21 ENCOUNTER — Other Ambulatory Visit (INDEPENDENT_AMBULATORY_CARE_PROVIDER_SITE_OTHER): Payer: Self-pay | Admitting: Family Medicine

## 2020-01-21 DIAGNOSIS — R7303 Prediabetes: Secondary | ICD-10-CM

## 2020-01-21 NOTE — Telephone Encounter (Signed)
MyChart message sent to pt to find out if they have enough medication to get them through until next appt.   

## 2020-01-29 ENCOUNTER — Other Ambulatory Visit: Payer: Self-pay | Admitting: Family Medicine

## 2020-01-29 DIAGNOSIS — N400 Enlarged prostate without lower urinary tract symptoms: Secondary | ICD-10-CM

## 2020-02-05 ENCOUNTER — Encounter (INDEPENDENT_AMBULATORY_CARE_PROVIDER_SITE_OTHER): Payer: Self-pay | Admitting: Family Medicine

## 2020-02-05 ENCOUNTER — Ambulatory Visit (INDEPENDENT_AMBULATORY_CARE_PROVIDER_SITE_OTHER): Payer: Medicare Other | Admitting: Family Medicine

## 2020-02-05 ENCOUNTER — Other Ambulatory Visit: Payer: Self-pay

## 2020-02-05 VITALS — BP 113/70 | HR 54 | Temp 98.0°F | Ht 68.0 in | Wt 228.0 lb

## 2020-02-05 DIAGNOSIS — E669 Obesity, unspecified: Secondary | ICD-10-CM | POA: Diagnosis not present

## 2020-02-05 DIAGNOSIS — E559 Vitamin D deficiency, unspecified: Secondary | ICD-10-CM

## 2020-02-05 DIAGNOSIS — G252 Other specified forms of tremor: Secondary | ICD-10-CM

## 2020-02-05 DIAGNOSIS — Z6834 Body mass index (BMI) 34.0-34.9, adult: Secondary | ICD-10-CM

## 2020-02-06 MED ORDER — VITAMIN D (ERGOCALCIFEROL) 1.25 MG (50000 UNIT) PO CAPS: 50000.0000 [IU] | ORAL_CAPSULE | ORAL | 0 refills | Status: DC

## 2020-02-06 NOTE — Progress Notes (Signed)
Chief Complaint:   OBESITY Eric [name] is here to discuss his progress with his obesity treatment plan along with follow-up of his obesity related diagnoses. Eric [name] is on the Category 3 Plan and states he is following his eating plan approximately 80% of the time. Eric [name] states he is walking and doing yard work for 60 minutes 2 times per week.  Today's visit was #: 46 Starting weight: 288 lbs Starting date: 12/14/2017 Today's weight: 228 lbs Today's date: 02/05/2020 Total lbs lost to date: 60 Total lbs lost since last in-office visit: 3  Interim History: Eric [name] continues to do well with weight loss. He seems to be doing well emotional eating and has increased activity. He is open to discussing Thanksgiving holiday eating strategies.  Subjective:   1. Vitamin D deficiency Eric [name] is stable on Vit D, and he requests a refill today.  2. Action tremor Eric [name] noticed a tremor while working and wonders if it is related to Wellbutrin. He stopped it last week but notes no change.  Assessment/Plan:   1. Vitamin D deficiency Low Vitamin D level contributes to fatigue and are associated with obesity, breast, and colon cancer. We will refill prescription Vitamin D for 1 month. Eric [name] will follow-up for routine testing of Vitamin D, at least 2-3 times per year to avoid over-replacement.  - Vitamin D, Ergocalciferol, (Eric [name]) 1.25 MG (50000 UNIT) CAPS capsule; Take 1 capsule (50,000 Units total) by mouth every 7 (seven) days.  Dispense: 4 capsule; Refill: 0  2. Action tremor Eric [name] agreed to discontinue Wellbutrin and we will refer to Dr. Jaynee [name] for evaluation.  - Ambulatory referral to Neurology  3. Class 1 obesity with serious comorbidity and body mass index (BMI) of 34.0 to 34.9 in adult, unspecified obesity type Eric [name] is currently in the action stage of change. As such, his goal is to continue with weight loss efforts. He has agreed to the Category 3 Plan.   Exercise goals: As  is.  Behavioral modification strategies: holiday eating strategies .  Eric [name] has agreed to follow-up with our clinic in 4 weeks. He was informed of the importance of frequent follow-up visits to maximize his success with intensive lifestyle modifications for his multiple health conditions.   Objective:   Blood pressure 113/70, pulse (!) 54, temperature 98 F (36.7 C), height 5\' 8"  (1.727 m), weight 228 lb (103.4 kg), SpO2 97 %. Body mass index is 34.67 kg/m.  General: Cooperative, alert, well developed, in no acute distress. HEENT: Conjunctivae and lids unremarkable. Cardiovascular: Regular rhythm.  Lungs: Normal work of breathing. Neurologic: No focal deficits.   Lab Results  Component Value Date   CREATININE 1.13 10/22/2019   BUN 16 10/22/2019   NA 139 10/22/2019   K 4.5 10/22/2019   CL 102 10/22/2019   CO2 26 10/22/2019   Lab Results  Component Value Date   ALT 18 10/22/2019   AST 15 10/22/2019   ALKPHOS 76 10/22/2019   BILITOT 0.7 10/22/2019   Lab Results  Component Value Date   HGBA1C 5.6 10/22/2019   HGBA1C 5.3 06/18/2019   HGBA1C 5.5 03/02/2019   HGBA1C 5.6 08/29/2018   HGBA1C 5.4 03/09/2018   Lab Results  Component Value Date   INSULIN 9.0 10/22/2019   INSULIN 21.3 12/14/2017   Lab Results  Component Value Date   TSH 1.060 12/14/2017   Lab Results  Component Value Date   CHOL 137 10/22/2019   HDL 50 10/22/2019   LDLCALC 73 10/22/2019  TRIG 66 10/22/2019   CHOLHDL 3 03/02/2019   Lab Results  Component Value Date   WBC 12.8 (H) 09/26/2018   HGB 13.4 09/26/2018   HCT 41.8 09/26/2018   MCV 95.4 09/26/2018   PLT 168 09/26/2018   No results found for: IRON, TIBC, FERRITIN  Obesity Behavioral Intervention:   Approximately 15 minutes were spent on the discussion below.  ASK: We discussed the diagnosis of obesity with [name] today and Kamarri agreed to give [place] permission to discuss obesity behavioral modification therapy  today.  ASSESS: Keeon has the diagnosis of obesity and his BMI today is 34.68. Maxime is in the action stage of change.   ADVISE: Riese was educated on the multiple health risks of obesity as well as the benefit of weight loss to improve his health. He was advised of the need for long term treatment and the importance of lifestyle modifications to improve his current health and to decrease his risk of future health problems.  AGREE: Multiple dietary modification options and treatment options were discussed and Cael agreed to follow the recommendations documented in the above note.  ARRANGE: Dexton was educated on the importance of frequent visits to treat obesity as outlined per CMS and USPSTF guidelines and agreed to schedule his next follow up appointment today.  Attestation Statements:   Reviewed by clinician on day of visit: allergies, medications, problem list, medical history, surgical history, family history, social history, and previous encounter notes.   I, Trixie Dredge, am acting as transcriptionist for [name], MD.  I have reviewed the above documentation for accuracy and completeness, and I agree with the above. -  [name], MD

## 2020-02-12 DIAGNOSIS — L814 Other melanin hyperpigmentation: Secondary | ICD-10-CM | POA: Diagnosis not present

## 2020-02-12 DIAGNOSIS — L820 Inflamed seborrheic keratosis: Secondary | ICD-10-CM | POA: Diagnosis not present

## 2020-02-12 DIAGNOSIS — X32XXXS Exposure to sunlight, sequela: Secondary | ICD-10-CM | POA: Diagnosis not present

## 2020-02-12 DIAGNOSIS — L570 Actinic keratosis: Secondary | ICD-10-CM | POA: Diagnosis not present

## 2020-02-12 DIAGNOSIS — L738 Other specified follicular disorders: Secondary | ICD-10-CM | POA: Diagnosis not present

## 2020-03-03 ENCOUNTER — Encounter: Payer: Self-pay | Admitting: Family Medicine

## 2020-03-03 ENCOUNTER — Ambulatory Visit (INDEPENDENT_AMBULATORY_CARE_PROVIDER_SITE_OTHER): Payer: Medicare Other | Admitting: Family Medicine

## 2020-03-03 ENCOUNTER — Other Ambulatory Visit: Payer: Self-pay

## 2020-03-03 VITALS — BP 132/72 | HR 52 | Temp 98.2°F | Ht 68.0 in | Wt 241.0 lb

## 2020-03-03 DIAGNOSIS — N401 Enlarged prostate with lower urinary tract symptoms: Secondary | ICD-10-CM | POA: Diagnosis not present

## 2020-03-03 DIAGNOSIS — E538 Deficiency of other specified B group vitamins: Secondary | ICD-10-CM

## 2020-03-03 DIAGNOSIS — E1165 Type 2 diabetes mellitus with hyperglycemia: Secondary | ICD-10-CM

## 2020-03-03 DIAGNOSIS — R350 Frequency of micturition: Secondary | ICD-10-CM

## 2020-03-03 DIAGNOSIS — G8929 Other chronic pain: Secondary | ICD-10-CM

## 2020-03-03 DIAGNOSIS — R3914 Feeling of incomplete bladder emptying: Secondary | ICD-10-CM | POA: Diagnosis not present

## 2020-03-03 DIAGNOSIS — Z0000 Encounter for general adult medical examination without abnormal findings: Secondary | ICD-10-CM

## 2020-03-03 DIAGNOSIS — E785 Hyperlipidemia, unspecified: Secondary | ICD-10-CM | POA: Diagnosis not present

## 2020-03-03 DIAGNOSIS — M545 Low back pain, unspecified: Secondary | ICD-10-CM

## 2020-03-03 DIAGNOSIS — E559 Vitamin D deficiency, unspecified: Secondary | ICD-10-CM | POA: Diagnosis not present

## 2020-03-03 DIAGNOSIS — I10 Essential (primary) hypertension: Secondary | ICD-10-CM | POA: Diagnosis not present

## 2020-03-03 LAB — CBC WITH DIFFERENTIAL/PLATELET
Basophils Absolute: 0 10*3/uL (ref 0.0–0.1)
Basophils Relative: 0.4 % (ref 0.0–3.0)
Eosinophils Absolute: 0 10*3/uL (ref 0.0–0.7)
Eosinophils Relative: 0.8 % (ref 0.0–5.0)
HCT: 46.7 % (ref 39.0–52.0)
Hemoglobin: 15.5 g/dL (ref 13.0–17.0)
Lymphocytes Relative: 29.6 % (ref 12.0–46.0)
Lymphs Abs: 1.4 10*3/uL (ref 0.7–4.0)
MCHC: 33.2 g/dL (ref 30.0–36.0)
MCV: 92.9 fl (ref 78.0–100.0)
Monocytes Absolute: 0.5 10*3/uL (ref 0.1–1.0)
Monocytes Relative: 10.1 % (ref 3.0–12.0)
Neutro Abs: 2.9 10*3/uL (ref 1.4–7.7)
Neutrophils Relative %: 59.1 % (ref 43.0–77.0)
Platelets: 178 10*3/uL (ref 150.0–400.0)
RBC: 5.03 Mil/uL (ref 4.22–5.81)
RDW: 13.9 % (ref 11.5–15.5)
WBC: 4.9 10*3/uL (ref 4.0–10.5)

## 2020-03-03 LAB — COMPREHENSIVE METABOLIC PANEL
ALT: 16 U/L (ref 0–53)
AST: 13 U/L (ref 0–37)
Albumin: 4.2 g/dL (ref 3.5–5.2)
Alkaline Phosphatase: 56 U/L (ref 39–117)
BUN: 18 mg/dL (ref 6–23)
CO2: 31 mEq/L (ref 19–32)
Calcium: 9.3 mg/dL (ref 8.4–10.5)
Chloride: 102 mEq/L (ref 96–112)
Creatinine, Ser: 1 mg/dL (ref 0.40–1.50)
GFR: 74.49 mL/min (ref >60.00)
Glucose, Bld: 95 mg/dL (ref 70–99)
Potassium: 4.3 mEq/L (ref 3.5–5.1)
Sodium: 139 mEq/L (ref 135–145)
Total Bilirubin: 0.8 mg/dL (ref 0.2–1.2)
Total Protein: 6.3 g/dL (ref 6.0–8.3)

## 2020-03-03 LAB — LIPID PANEL
Cholesterol: 131 mg/dL (ref 0–200)
HDL: 46.1 mg/dL (ref >39.00)
LDL Cholesterol: 70 mg/dL (ref 0–99)
NonHDL: 85.19
Total CHOL/HDL Ratio: 3
Triglycerides: 78 mg/dL (ref 0.0–149.0)
VLDL: 15.6 mg/dL (ref 0.0–40.0)

## 2020-03-03 LAB — PSA: PSA: 1 ng/mL (ref 0.10–4.00)

## 2020-03-03 LAB — VITAMIN B12: Vitamin B-12: 385 pg/mL (ref 211–911)

## 2020-03-03 LAB — VITAMIN D 25 HYDROXY (VIT D DEFICIENCY, FRACTURES): VITD: 49.51 ng/mL (ref 30.00–100.00)

## 2020-03-03 LAB — HEMOGLOBIN A1C: Hgb A1c MFr Bld: 5.4 % (ref 4.6–6.5)

## 2020-03-03 NOTE — Assessment & Plan Note (Signed)
Worsening Check mri ls spine  Pt requesting neurosurgery referral

## 2020-03-03 NOTE — Progress Notes (Signed)
Patient ID: Eric [name], male    DOB: [birth year]  Age: 74 y.o. MRN: [id number]    Subjective:  Subjective  HPI Eric [name] presents for cpe.   Pt is requesting a neuro surgeon referral due to worsening back pain.   No new injury Pt also requesting urology referral due to difficulty with emptying his bladder   Review of Systems  Constitutional: Negative.  Negative for appetite change, diaphoresis, fatigue and unexpected weight change.  HENT: Negative for congestion, ear pain, hearing loss, nosebleeds, postnasal drip, rhinorrhea, sinus pressure, sneezing and tinnitus.   Eyes: Negative for photophobia, pain, discharge, redness, itching and visual disturbance.  Respiratory: Negative.  Negative for cough, chest tightness, shortness of breath and wheezing.   Cardiovascular: Negative.  Negative for chest pain, palpitations and leg swelling.  Gastrointestinal: Negative for abdominal distention, abdominal pain, anal bleeding, blood in stool and constipation.  Endocrine: Negative.  Negative for cold intolerance, heat intolerance, polydipsia, polyphagia and polyuria.  Genitourinary: Positive for decreased urine volume, difficulty urinating and frequency. Negative for dysuria.  Musculoskeletal: Negative.   Skin: Negative.   Allergic/Immunologic: Negative.   Neurological: Negative for dizziness, weakness, light-headedness, numbness and headaches.  Psychiatric/Behavioral: Negative for agitation, confusion, decreased concentration, dysphoric mood, sleep disturbance and suicidal ideas. The patient is not nervous/anxious.     History Past Medical History:  Diagnosis Date  . Alcohol abuse   . Allergy   . Back pain   . Cataract    forming  . Colon polyps   . Diverticulosis of colon   . DJD (degenerative joint disease)   . Dysrhythmia    Palpations from caffiene- reports he was having PACs due to slim fast wth caffeine - had full cardiac w/u and was told to cut back on caffeine;  now  drink decaf bevrerages and no rpeort of recurrence   . Enlarged prostate   . GERD (gastroesophageal reflux disease)   . Hearing loss   . Hip osteoarthritis    Left  . History of hay fever   . Hx of cardiovascular stress test    Lexiscan Myoview (04/2013):  No ischemia, EF 61%; low risk  . Hyperlipemia   . Hypertension   . Insulin resistance    om metfromin   . Internal hemorrhoids without mention of complication   . Leg edema   . Neuroma of foot    left  . Obesity   . Osteoarthrosis, unspecified whether generalized or localized, unspecified site   . Palpitations   . 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 Primary localized osteoarthrosis of right shoulder 05/25/2016  . Recent weight loss    75lbs  . Seasonal allergies   . Sleep apnea    CPAP  . Thyroid nodule    calcified thyroid nodules- bx negative   . Tremor of both hands     He has a past surgical history that includes Shoulder surgery (2009); Tonsillectomy; Colonoscopy; Total shoulder arthroplasty (Left, 10/14/2015); Total shoulder arthroplasty (Right, 05/25/2016); Total hip arthroplasty (Left, 03/28/2017); Polypectomy; and Total hip arthroplasty (Right, 09/25/2018).   His family history includes Anxiety disorder in his mother; Arthritis in his mother; Breast cancer in his mother, sister, and another family member; Cancer (age of onset: 81) in his sister; Cancer (age of onset: 74) in his mother; Cancer (age of onset: 9) in his brother; Colon polyps (age of onset: 30) in his mother; Depression in his father and mother; Diabetes in his father; Heart disease in his  father and mother; Hyperlipidemia in his father and mother; Hypertension in his father and mother; Obesity in his father and mother; Prostate cancer in his brother and another family member; Sleep apnea in his father.He reports that he has never smoked. He has never used smokeless tobacco. He reports current alcohol use. He reports that he does not use  drugs.  Current Outpatient Medications on File Prior to Visit  Medication Sig Dispense Refill  . aspirin EC 81 MG tablet Take 1 tablet (81 mg total) by mouth 2 (two) times daily. (Patient taking differently: Take 81 mg by mouth once.) 60 tablet 0  . atorvastatin (LIPITOR) 40 MG tablet TAKE 1 TABLET DAILY 90 tablet 3  . carbamide peroxide (DEBROX) 6.5 % OTIC solution Place 5 drops into both ears daily as needed (for ear wax).    . clindamycin (CLEOCIN-T) 1 % lotion Apply topically 2 (two) times daily. 60 mL 2  . Coenzyme Q10 (COQ10) 200 MG CAPS Take 200 mg by mouth at bedtime.    . fluocinonide ointment (LIDEX) 4.58 % Apply 1 application topically daily as needed (for bites). 30 g 3  . fluticasone (FLONASE) 50 MCG/ACT nasal spray Place 2 sprays into both nostrils daily as needed for allergies.     [name] loratadine (CLARITIN) 10 MG tablet Take 10 mg by mouth daily as needed for allergies.     . meloxicam (MOBIC) 15 MG tablet TAKE 1 TABLET DAILY 90 tablet 3  . metFORMIN (GLUCOPHAGE) 500 MG tablet TAKE ONE TABLET BY MOUTH ONCE DAILY WITHBREAKFAST 30 tablet 0  . pantoprazole (PROTONIX) 40 MG tablet TAKE 1 TABLET DAILY 90 tablet 3  . tamsulosin (FLOMAX) 0.4 MG CAPS capsule TAKE 1 CAPSULE DAILY 90 capsule 3  . valsartan (DIOVAN) 80 MG tablet TAKE 1 TABLET DAILY 90 tablet 3  . Vitamin D, Ergocalciferol, (DRISDOL) 1.25 MG (50000 UNIT) CAPS capsule Take 1 capsule (50,000 Units total) by mouth every 7 (seven) days. 4 capsule 0  . Zinc 50 MG CAPS Take by mouth daily.     No current facility-administered medications on file prior to visit.     Objective:  Objective  Physical Exam Vitals and nursing note reviewed.  Constitutional:      General: He is not in acute distress.    Appearance: He is well-developed and well-nourished. He is not diaphoretic.  HENT:     Head: Normocephalic and atraumatic.     Right Ear: External ear normal.     Left Ear: External ear normal.     Nose: Nose normal.      Mouth/Throat:     Mouth: Oropharynx is clear and moist.     Pharynx: No oropharyngeal exudate.  Eyes:     General:        Right eye: No discharge.        Left eye: No discharge.     Extraocular Movements: EOM normal.     Conjunctiva/sclera: Conjunctivae normal.     Pupils: Pupils are equal, round, and reactive to light.  Neck:     Thyroid: No thyromegaly.     Vascular: No JVD.  Cardiovascular:     Rate and Rhythm: Normal rate and regular rhythm.     Pulses: Intact distal pulses.     Heart sounds: No murmur heard. No friction rub. No gallop.   Pulmonary:     Effort: Pulmonary effort is normal. No respiratory distress.     Breath sounds: Normal breath sounds. No wheezing or rales.  Chest:     Chest wall: No tenderness.  Abdominal:     General: Bowel sounds are normal. There is no distension.     Palpations: Abdomen is soft. There is no mass.     Tenderness: There is no abdominal tenderness. There is no guarding or rebound.  Genitourinary:    Comments: Urology referral placed Musculoskeletal:        General: No tenderness or edema. Normal range of motion.     Cervical back: Normal range of motion and neck supple.  Lymphadenopathy:     Cervical: No cervical adenopathy.  Skin:    General: Skin is warm and dry.     Coloration: Skin is not pale.     Findings: No erythema or rash.  Neurological:     Mental Status: He is alert and oriented to person, place, and time.     Motor: No abnormal muscle tone.     Deep Tendon Reflexes: Reflexes are normal and symmetric. Reflexes normal.  Psychiatric:        Mood and Affect: Mood and affect normal.        Behavior: Behavior normal.        Thought Content: Thought content normal.        Judgment: Judgment normal.    BP 132/72 (BP Location: Left Arm, Patient Position: Sitting, Cuff Size: Large)   Pulse (!) 52   Temp 98.2 F (36.8 C) (Oral)   Ht 5\' 8"  (1.727 m)   Wt 241 lb (109.3 kg)   SpO2 96%   BMI 36.64 kg/m  Wt Readings from  Last 3 Encounters:  03/03/20 241 lb (109.3 kg)  02/05/20 228 lb (103.4 kg)  01/08/20 231 lb (104.8 kg)     Lab Results  Component Value Date   WBC 12.8 (H) 09/26/2018   HGB 13.4 09/26/2018   HCT 41.8 09/26/2018   PLT 168 09/26/2018   GLUCOSE 86 10/22/2019   CHOL 137 10/22/2019   TRIG 66 10/22/2019   HDL 50 10/22/2019   LDLCALC 73 10/22/2019   ALT 18 10/22/2019   AST 15 10/22/2019   NA 139 10/22/2019   K 4.5 10/22/2019   CL 102 10/22/2019   CREATININE 1.13 10/22/2019   BUN 16 10/22/2019   CO2 26 10/22/2019   TSH 1.060 12/14/2017   PSA 1.09 03/02/2019   INR 1.0 09/21/2018   HGBA1C 5.6 10/22/2019   MICROALBUR 1.5 12/25/2012    DG Chest 2 View  Result Date: 09/21/2018 CLINICAL DATA:  Preoperative EXAM: CHEST - 2 VIEW COMPARISON:  03/16/2017 FINDINGS: The heart size and mediastinal contours are within normal limits. Both lungs are clear. The visualized skeletal structures are unremarkable. IMPRESSION: No acute abnormality of the lungs. Electronically Signed   By: Eddie Candle M.D.   On: 09/21/2018 11:38     Assessment & Plan:  Plan  I am having [place] L. [place] "Eric [name]" maintain his loratadine, CoQ10, fluticasone, fluocinonide ointment, carbamide peroxide, aspirin EC, Zinc, meloxicam, valsartan, atorvastatin, clindamycin, pantoprazole, metFORMIN, tamsulosin, and Vitamin D (Ergocalciferol).  No orders of the defined types were placed in this encounter.   Problem List Items Addressed This Visit      Unprioritized   Essential hypertension    Well controlled, no changes to meds. Encouraged heart healthy diet such as the DASH diet and exercise as tolerated.       Hyperlipidemia    Tolerating statin, encouraged heart healthy diet, avoid trans fats, minimize simple carbs and saturated fats. Increase  exercise as tolerated      Low back pain    Worsening Check mri ls spine  Pt requesting neurosurgery referral      Relevant Orders   MR Lumbar Spine Wo Contrast    Ambulatory referral to Neurosurgery   Morbid obesity ([place])   Relevant Orders   CBC with Differential/Platelet   Lipid panel   Vitamin B12   Comprehensive metabolic panel   Vitamin D (25 hydroxy)   Insulin, random   Preventative health care - Primary   Relevant Orders   CBC with Differential/Platelet   Lipid panel   Vitamin B12   Comprehensive metabolic panel   Vitamin D (25 hydroxy)   Urinary frequency   Relevant Orders   Ambulatory referral to Urology   PSA   Vitamin D deficiency   Relevant Orders   Vitamin D (25 hydroxy)    Other Visit Diagnoses    Benign prostatic hyperplasia with incomplete bladder emptying       Relevant Orders   Ambulatory referral to Urology   PSA   B12 deficiency       Relevant Orders   Vitamin B12   Type 2 diabetes mellitus with hyperglycemia, without long-term current use of insulin ([place])       Relevant Orders   Hemoglobin A1c      Follow-up: Return in about 6 months (around 09/01/2020) for hypertension, hyperlipidemia.  [name], DO

## 2020-03-03 NOTE — Patient Instructions (Signed)

## 2020-03-03 NOTE — Assessment & Plan Note (Signed)
Tolerating statin, encouraged heart healthy diet, avoid trans fats, minimize simple carbs and saturated fats. Increase exercise as tolerated 

## 2020-03-03 NOTE — Assessment & Plan Note (Signed)
Well controlled, no changes to meds. Encouraged heart healthy diet such as the DASH diet and exercise as tolerated.  °

## 2020-03-04 ENCOUNTER — Encounter (INDEPENDENT_AMBULATORY_CARE_PROVIDER_SITE_OTHER): Payer: Self-pay | Admitting: Family Medicine

## 2020-03-04 ENCOUNTER — Ambulatory Visit (INDEPENDENT_AMBULATORY_CARE_PROVIDER_SITE_OTHER): Payer: Medicare Other | Admitting: Family Medicine

## 2020-03-04 VITALS — BP 113/67 | HR 50 | Temp 97.6°F | Ht 68.0 in | Wt 234.0 lb

## 2020-03-04 DIAGNOSIS — E8881 Metabolic syndrome: Secondary | ICD-10-CM | POA: Diagnosis not present

## 2020-03-04 DIAGNOSIS — Z6835 Body mass index (BMI) 35.0-35.9, adult: Secondary | ICD-10-CM

## 2020-03-04 DIAGNOSIS — E559 Vitamin D deficiency, unspecified: Secondary | ICD-10-CM | POA: Diagnosis not present

## 2020-03-04 LAB — INSULIN, RANDOM: Insulin: 10.8 u[IU]/mL

## 2020-03-11 MED ORDER — METFORMIN HCL 500 MG PO TABS: ORAL_TABLET | ORAL | 0 refills | Status: DC

## 2020-03-11 MED ORDER — VITAMIN D (ERGOCALCIFEROL) 1.25 MG (50000 UNIT) PO CAPS: 50000.0000 [IU] | ORAL_CAPSULE | ORAL | 0 refills | Status: DC

## 2020-03-11 NOTE — Progress Notes (Signed)
Chief Complaint:   OBESITY Jenifer is here to discuss his progress with his obesity treatment plan along with follow-up of his obesity related diagnoses. Petronilo is on the Category 3 Plan and states he is following his eating plan approximately 66% of the time. Alyis states he is using 5 lbs dumbbells 3-4 times per week, and raking leaves for 3-4 hours.  Today's visit was #: 30 Starting weight: 288 lbs Starting date: 12/14/2017 Today's weight: 234 lbs Today's date: 03/04/2020 Total lbs lost to date: 54 Total lbs lost since last in-office visit: 0  Interim History: Arshdeep has dome more celebration eating in the last few weeks, and he gained some weight. He will be traveling over Christmas and he will continue to have extra temptations for the next 2-3 weeks.  Subjective:   1. Vitamin D deficiency Garwin is stable on Vit D, and he denies nausea, vomiting, or muscle weakness. He requests a refill today.  2. Insulin resistance Carols is working on diet and decreasing simple carbohydrates. He is tolerating metformin well with no nausea or vomiting.  Assessment/Plan:   1. Vitamin D deficiency Low Vitamin D level contributes to fatigue and are associated with obesity, breast, and colon cancer. We will refill prescription Vitamin D 50,000 IU every week for 1 month. Osie will follow-up for routine testing of Vitamin D, at least 2-3 times per year to avoid over-replacement.  2. Insulin resistance Kaspian will continue to work on weight loss, exercise, and decreasing simple carbohydrates to help decrease the risk of diabetes. We will refill metformin 500 mg q daily for 1 month. Stone agreed to follow-up with [place] as directed to closely monitor his progress.  3. Class 2 severe obesity with serious comorbidity and body mass index (BMI) of 35.0 to 35.9 in adult, unspecified obesity type [facility]) Ulyesses is currently in the action stage of change. As such, his goal is to continue with  weight loss efforts. He has agreed to the Category 3 Plan.   Exercise goals: As is.  Behavioral modification strategies: increasing lean protein intake, emotional eating strategies, travel eating strategies and holiday eating strategies .  Motty has agreed to follow-up with our clinic in 3 to 4 weeks. He was informed of the importance of frequent follow-up visits to maximize his success with intensive lifestyle modifications for his multiple health conditions.   Objective:   Blood pressure 113/67, pulse (!) 50, temperature 97.6 F (36.4 C), height 5\' 8"  (1.727 m), weight 234 lb (106.1 kg), SpO2 97 %. Body mass index is 35.58 kg/m.  General: Cooperative, alert, well developed, in no acute distress. HEENT: Conjunctivae and lids unremarkable. Cardiovascular: Regular rhythm.  Lungs: Normal work of breathing. Neurologic: No focal deficits.   Lab Results  Component Value Date   CREATININE 1.00 03/03/2020   BUN 18 03/03/2020   NA 139 03/03/2020   K 4.3 03/03/2020   CL 102 03/03/2020   CO2 31 03/03/2020   Lab Results  Component Value Date   ALT 16 03/03/2020   AST 13 03/03/2020   ALKPHOS 56 03/03/2020   BILITOT 0.8 03/03/2020   Lab Results  Component Value Date   HGBA1C 5.4 03/03/2020   HGBA1C 5.6 10/22/2019   HGBA1C 5.3 06/18/2019   HGBA1C 5.5 03/02/2019   HGBA1C 5.6 08/29/2018   Lab Results  Component Value Date   INSULIN 9.0 10/22/2019   INSULIN 21.3 12/14/2017   Lab Results  Component Value Date   TSH 1.060 12/14/2017  Lab Results  Component Value Date   CHOL 131 03/03/2020   HDL 46.10 03/03/2020   LDLCALC 70 03/03/2020   TRIG 78.0 03/03/2020   CHOLHDL 3 03/03/2020   Lab Results  Component Value Date   WBC 4.9 03/03/2020   HGB 15.5 03/03/2020   HCT 46.7 03/03/2020   MCV 92.9 03/03/2020   PLT 178.0 03/03/2020   No results found for: IRON, TIBC, FERRITIN  Obesity Behavioral Intervention:   Approximately 15 minutes were spent on the discussion  below.  ASK: We discussed the diagnosis of obesity with [name] today and Carlen agreed to give [place] permission to discuss obesity behavioral modification therapy today.  ASSESS: Arsh has the diagnosis of obesity and his BMI today is 35.59. Colbin is in the action stage of change.   ADVISE: Scot was educated on the multiple health risks of obesity as well as the benefit of weight loss to improve his health. He was advised of the need for long term treatment and the importance of lifestyle modifications to improve his current health and to decrease his risk of future health problems.  AGREE: Multiple dietary modification options and treatment options were discussed and Dahl agreed to follow the recommendations documented in the above note.  ARRANGE: Jaylend was educated on the importance of frequent visits to treat obesity as outlined per CMS and USPSTF guidelines and agreed to schedule his next follow up appointment today.  Attestation Statements:   Reviewed by clinician on day of visit: allergies, medications, problem list, medical history, surgical history, family history, social history, and previous encounter notes.   I, Trixie Dredge, am acting as transcriptionist for [name], MD.  I have reviewed the above documentation for accuracy and completeness, and I agree with the above. -  [name], MD

## 2020-03-29 ENCOUNTER — Other Ambulatory Visit: Payer: Self-pay

## 2020-03-29 ENCOUNTER — Ambulatory Visit (HOSPITAL_BASED_OUTPATIENT_CLINIC_OR_DEPARTMENT_OTHER)
Admission: RE | Admit: 2020-03-29 | Discharge: 2020-03-29 | Disposition: A | Discharge status: Home / Self Care | Payer: Medicare Other | Source: Ambulatory Visit | Attending: Family Medicine | Admitting: Family Medicine

## 2020-03-29 DIAGNOSIS — G8929 Other chronic pain: Secondary | ICD-10-CM | POA: Diagnosis not present

## 2020-03-29 DIAGNOSIS — M545 Low back pain, unspecified: Secondary | ICD-10-CM | POA: Insufficient documentation

## 2020-04-01 DIAGNOSIS — Z6836 Body mass index (BMI) 36.0-36.9, adult: Secondary | ICD-10-CM | POA: Diagnosis not present

## 2020-04-01 DIAGNOSIS — M4807 Spinal stenosis, lumbosacral region: Secondary | ICD-10-CM | POA: Diagnosis not present

## 2020-04-01 DIAGNOSIS — I10 Essential (primary) hypertension: Secondary | ICD-10-CM | POA: Diagnosis not present

## 2020-04-02 ENCOUNTER — Other Ambulatory Visit: Payer: Self-pay

## 2020-04-02 ENCOUNTER — Encounter (INDEPENDENT_AMBULATORY_CARE_PROVIDER_SITE_OTHER): Payer: Self-pay | Admitting: Family Medicine

## 2020-04-02 ENCOUNTER — Ambulatory Visit (INDEPENDENT_AMBULATORY_CARE_PROVIDER_SITE_OTHER): Payer: Medicare Other | Admitting: Family Medicine

## 2020-04-02 VITALS — BP 118/64 | HR 63 | Temp 98.1°F | Ht 68.0 in | Wt 235.0 lb

## 2020-04-02 DIAGNOSIS — F3289 Other specified depressive episodes: Secondary | ICD-10-CM | POA: Diagnosis not present

## 2020-04-02 DIAGNOSIS — Z6835 Body mass index (BMI) 35.0-35.9, adult: Secondary | ICD-10-CM

## 2020-04-02 DIAGNOSIS — E8881 Metabolic syndrome: Secondary | ICD-10-CM

## 2020-04-02 DIAGNOSIS — F32A Depression, unspecified: Secondary | ICD-10-CM | POA: Insufficient documentation

## 2020-04-02 DIAGNOSIS — E559 Vitamin D deficiency, unspecified: Secondary | ICD-10-CM

## 2020-04-02 MED ORDER — BUPROPION HCL ER (SR) 200 MG PO TB12: 200.0000 mg | ORAL_TABLET | Freq: Every day | ORAL | 0 refills | Status: DC

## 2020-04-02 MED ORDER — VITAMIN D (ERGOCALCIFEROL) 1.25 MG (50000 UNIT) PO CAPS: 50000.0000 [IU] | ORAL_CAPSULE | ORAL | 0 refills | Status: DC

## 2020-04-03 DIAGNOSIS — M48061 Spinal stenosis, lumbar region without neurogenic claudication: Secondary | ICD-10-CM | POA: Diagnosis not present

## 2020-04-03 DIAGNOSIS — M5451 Vertebrogenic low back pain: Secondary | ICD-10-CM | POA: Diagnosis not present

## 2020-04-06 NOTE — Progress Notes (Signed)
Chief Complaint:   OBESITY Eric [name] is here to discuss his progress with his obesity treatment plan along with follow-up of his obesity related diagnoses. Eric [name] is on the Category 3 Plan and states he is following his eating plan approximately 50% of the time. Eric [name] states he is exercising 0 minutes 0 times per week.  Today's visit was #: 81 Starting weight: 288 lbs Starting date: 12/14/2017 Today's weight: 235 lbs Today's date: 04/02/2020 Total lbs lost to date: 53 lbs Total lbs lost since last in-office visit: +1 lb Total weight loss percentage to date: -18.40%  Interim History: Eric [name] is a patient of Dr. Leafy Ro. This is his first visit with me. He denies concerns with the plan. Physical activity is limited by back and joint pains currently. He sees Chief of Staff.  Plan: Encouraged to look into [place] [facility] [name]) and start moving more.  Assessment/Plan:   Meds ordered this encounter  Medications  . Vitamin D, Ergocalciferol, (DRISDOL) 1.25 MG (50000 UNIT) CAPS capsule    Sig: Take 1 capsule (50,000 Units total) by mouth every 7 (seven) days.    Dispense:  4 capsule    Refill:  0  . buPROPion (WELLBUTRIN SR) 200 MG 12 hr tablet    Sig: Take 1 tablet (200 mg total) by mouth daily.    Dispense:  30 tablet    Refill:  0  . metFORMIN (GLUCOPHAGE) 500 MG tablet    Sig: TAKE ONE TABLET BY MOUTH ONCE DAILY WITHBREAKFAST    Dispense:  30 tablet    Refill:  0    1. Vitamin D deficiency Eric [name]'s Vitamin D level was 55.6 on 10/22/2019. He is currently taking prescription vitamin D 50,000 IU each week. He denies nausea, vomiting or muscle weakness.  Ref. Range 10/22/2019 12:43  Vitamin D, 25-Hydroxy Latest Ref Range: 30.0 - 100.0 ng/mL 55.6   Plan: Refill Vit D for 1 month, as per below. Low Vitamin D level contributes to fatigue and are associated with obesity, breast, and colon cancer. He agrees to continue to take prescription Vitamin D @50 ,000 IU every week and will  follow-up for routine testing of Vitamin D, at least 2-3 times per year to avoid over-replacement.  Refill- Vitamin D, Ergocalciferol, (DRISDOL) 1.25 MG (50000 UNIT) CAPS capsule; Take 1 capsule (50,000 Units total) by mouth every 7 (seven) days.  Dispense: 4 capsule; Refill: 0    2. Other depression with emotional eating Eric [name] stopped Wellbutrin 1 month ago, as he thought it was contributing to tremors. Dr. Leafy Ro sent patient to neuro and they did not feel that Wellbutrin was contributing to his tremors.  They did not feel he needed to stop it.  He would like restart Wellbutrin, as it helped with cravings and emotional eating.  Plan: Restart Wellbutrin, but patient wishes to start at 200 mg. He is aware of risks/benefits of medication and knows to contact us with any concerns as needed.  Restart- buPROPion (WELLBUTRIN SR) 200 MG 12 hr tablet; Take 1 tablet (200 mg total) by mouth daily.  Dispense: 30 tablet; Refill: 0     3. Insulin Resistance: -  He has been working on diet and exercise to prevent progression of glucose intolerance   - Patient reports good compliance with medication and/or lifestyle modification  - His denies acute concerns or problems related to treatment plan  - He denies polyuria/polydipsia, hypo/ hyperglycemia symptoms.  Denies new onset of: chest pain, exercise intolerance, shortness of breath, dizziness, visual  changes, headache, lower extremity swelling or claudication.   Last A1C in the office was:  Lab Results  Component Value Date   HGBA1C 5.4 03/03/2020   HGBA1C 5.6 10/22/2019   HGBA1C 5.3 06/18/2019   Lab Results  Component Value Date   MICROALBUR 1.5 12/25/2012   Eric [name] 71 03/03/2020   CREATININE 1.00 03/03/2020    Wt Readings from Last 3 Encounters:  04/02/20 235 lb (106.6 kg)  03/04/20 234 lb (106.1 kg)  03/03/20 241 lb (109.3 kg)    BP Readings from Last 3 Encounters:  04/02/20 118/64  03/04/20 113/67  03/03/20 132/72      Plan: - I reiterated and again counseled patient on pathophysiology of the disease process of I.R. - Stressed importance of dietary and lifestyle modifications resulting in weight loss as first line txmnt - in addition we discussed the risks and benefits of metformin and various other medication options which can help [place] in the management of this disease process.  - Metformin was refilled today - continue to decrease simple carbs; increase fiber and proteins -> follow meal plan  - handouts provided at pt's request after education provided.  All concerns/questions addressed.   - anticipatory guidance given.   - Recheck A1c and fasting insulin level in approximately 3 months from last check or as deemed fit.     4. Class 2 severe obesity with serious comorbidity and body mass index (BMI) of 35.0 to 35.9 in adult, unspecified obesity type Eric [name]) Eric [name] is currently in the action stage of change. As such, his goal is to continue with weight loss efforts. He has agreed to the Category 3 Plan.   Exercise goals: Older adults should follow the adult guidelines. When older adults cannot meet the adult guidelines, they should be as physically active as their abilities and conditions will allow.   Behavioral modification strategies: meal planning and cooking strategies and keeping healthy foods in the home.  Eric [name] has agreed to follow-up with our clinic in 3-4 weeks. He was informed of the importance of frequent follow-up visits to maximize his success with intensive lifestyle modifications for his multiple health conditions.   Objective:   Blood pressure 118/64, pulse 63, temperature 98.1 F (36.7 C), height 5\' 8"  (1.727 m), weight 235 lb (106.6 kg), SpO2 96 %. Body mass index is 35.73 kg/m.  General: Cooperative, alert, well developed, in no acute distress. HEENT: Conjunctivae and lids unremarkable. Cardiovascular: Regular rhythm.  Lungs: Normal work of breathing. Neurologic: No  focal deficits.   Lab Results  Component Value Date   CREATININE 1.00 03/03/2020   BUN 18 03/03/2020   NA 139 03/03/2020   K 4.3 03/03/2020   CL 102 03/03/2020   CO2 31 03/03/2020   Lab Results  Component Value Date   ALT 16 03/03/2020   AST 13 03/03/2020   ALKPHOS 56 03/03/2020   BILITOT 0.8 03/03/2020   Lab Results  Component Value Date   HGBA1C 5.4 03/03/2020   HGBA1C 5.6 10/22/2019   HGBA1C 5.3 06/18/2019   HGBA1C 5.5 03/02/2019   HGBA1C 5.6 08/29/2018   Lab Results  Component Value Date   INSULIN 9.0 10/22/2019   INSULIN 21.3 12/14/2017   Lab Results  Component Value Date   TSH 1.060 12/14/2017   Lab Results  Component Value Date   CHOL 131 03/03/2020   HDL 46.10 03/03/2020   LDLCALC 70 03/03/2020   TRIG 78.0 03/03/2020   CHOLHDL 3 03/03/2020   Lab Results  Component  Value Date   WBC 4.9 03/03/2020   HGB 15.5 03/03/2020   HCT 46.7 03/03/2020   MCV 92.9 03/03/2020   PLT 178.0 03/03/2020   No results found for: IRON, TIBC, FERRITIN  Obesity Behavioral Intervention:   Approximately 15 minutes were spent on the discussion below.  ASK: We discussed the diagnosis of obesity with Eric [name] today and Eric [name] agreed to give [place] permission to discuss obesity behavioral modification therapy today.  ASSESS: Eric [name] has the diagnosis of obesity and his BMI today is 35.8. Eric [name] is in the action stage of change.   ADVISE: Eric [name] was educated on the multiple health risks of obesity as well as the benefit of weight loss to improve his health. He was advised of the need for long term treatment and the importance of lifestyle modifications to improve his current health and to decrease his risk of future health problems.  AGREE: Multiple dietary modification options and treatment options were discussed and Eric [name] agreed to follow the recommendations documented in the above note.  ARRANGE: Eric [name] was educated on the importance of frequent visits to treat  obesity as outlined per CMS and USPSTF guidelines and agreed to schedule his next follow up appointment today.  Attestation Statements:   Reviewed by clinician on day of visit: allergies, medications, problem list, medical history, surgical history, family history, social history, and previous encounter notes.  Coral Ceo, am acting as Location manager for [employer], DO.  I have reviewed the above documentation for accuracy and completeness, and I agree with the above. Marjory Sneddon, D.O.  The [place] was signed into law in 2016 which includes the topic of electronic health records.  This provides immediate access to information in MyChart.  This includes consultation notes, operative notes, office notes, lab results and pathology reports.  If you have any questions about what you read please let us know at your next visit so we can discuss your concerns and take corrective action if need be.  We are right here with you.

## 2020-04-07 DIAGNOSIS — M48061 Spinal stenosis, lumbar region without neurogenic claudication: Secondary | ICD-10-CM | POA: Diagnosis not present

## 2020-04-07 DIAGNOSIS — M5451 Vertebrogenic low back pain: Secondary | ICD-10-CM | POA: Diagnosis not present

## 2020-04-09 ENCOUNTER — Other Ambulatory Visit (INDEPENDENT_AMBULATORY_CARE_PROVIDER_SITE_OTHER): Payer: Self-pay | Admitting: Family Medicine

## 2020-04-09 DIAGNOSIS — E8881 Metabolic syndrome: Secondary | ICD-10-CM

## 2020-04-09 NOTE — Telephone Encounter (Signed)
This patient was last seen by Dr. Raliegh Scarlet, and currently has an upcoming appt scheduled on 05/05/20 with Dr. Leafy Ro.

## 2020-04-10 DIAGNOSIS — M48061 Spinal stenosis, lumbar region without neurogenic claudication: Secondary | ICD-10-CM | POA: Diagnosis not present

## 2020-04-10 DIAGNOSIS — M5451 Vertebrogenic low back pain: Secondary | ICD-10-CM | POA: Diagnosis not present

## 2020-04-10 MED ORDER — METFORMIN HCL 500 MG PO TABS: ORAL_TABLET | ORAL | 0 refills | Status: DC

## 2020-04-11 DIAGNOSIS — N401 Enlarged prostate with lower urinary tract symptoms: Secondary | ICD-10-CM | POA: Diagnosis not present

## 2020-04-11 DIAGNOSIS — R351 Nocturia: Secondary | ICD-10-CM | POA: Diagnosis not present

## 2020-04-11 DIAGNOSIS — R350 Frequency of micturition: Secondary | ICD-10-CM | POA: Diagnosis not present

## 2020-04-11 DIAGNOSIS — R3915 Urgency of urination: Secondary | ICD-10-CM | POA: Diagnosis not present

## 2020-04-16 DIAGNOSIS — M5451 Vertebrogenic low back pain: Secondary | ICD-10-CM | POA: Diagnosis not present

## 2020-04-16 DIAGNOSIS — M48061 Spinal stenosis, lumbar region without neurogenic claudication: Secondary | ICD-10-CM | POA: Diagnosis not present

## 2020-04-23 DIAGNOSIS — M5451 Vertebrogenic low back pain: Secondary | ICD-10-CM | POA: Diagnosis not present

## 2020-04-23 DIAGNOSIS — M48061 Spinal stenosis, lumbar region without neurogenic claudication: Secondary | ICD-10-CM | POA: Diagnosis not present

## 2020-04-24 ENCOUNTER — Telehealth: Payer: Self-pay | Admitting: Family Medicine

## 2020-04-24 ENCOUNTER — Ambulatory Visit (INDEPENDENT_AMBULATORY_CARE_PROVIDER_SITE_OTHER): Payer: Medicare Other | Admitting: Neurology

## 2020-04-24 ENCOUNTER — Encounter: Payer: Self-pay | Admitting: Neurology

## 2020-04-24 VITALS — BP 136/80 | HR 58 | Ht 68.0 in | Wt 244.0 lb

## 2020-04-24 DIAGNOSIS — G250 Essential tremor: Secondary | ICD-10-CM | POA: Diagnosis not present

## 2020-04-24 NOTE — Telephone Encounter (Signed)
Wife states they received a bill for [facility] 03/03/20 it was billed as preventative care it should be medicare wellness.  Please advise

## 2020-04-24 NOTE — Progress Notes (Signed)
Subjective:    Patient ID: Eric [name] is a 75 y.o. male.  HPI     Star Age, MD, PhD [facility] [address], [address] [place], [place] [address]  Dear Dr. Leafy Ro,   I saw your patient, Eric [name], upon your kind request in my neurologic clinic today for initial consultation of his tremor.  The patient is unaccompanied today.  As you know, Mr. Eric [name] is a 75 year old right-handed gentleman with an underlying medical history of diverticulosis, hypertension, hyperlipidemia, insulin resistance, vitamin D deficiency, edema, osteoarthritis, seasonal allergies, sleep apnea (uses CPAP), back pain, and obesity, who reports a longstanding history of approximately 10+ years maybe 12+ years of bilateral hand tremors.  His tremor has become worse over time.  Recently, when he started Wellbutrin, he felt that the tremor became worse so he stopped the Wellbutrin but it did not really affect the tremors.  He restarted the Wellbutrin and did not notice a significant change.  He has noticed that caffeine affects his tremor.  His tremor is primarily noticeable when he holds something or uses his hands, sometimes he can suppress the tremor but not for long.  It is bilateral and he has also started noticing a quivering in his jaw and almost like a teeth chattering.  He recalls that his paternal grandfather had a severe hand tremor.  His father also had a tremor but he did not see his father very often.  Father died of congestive heart failure.  His dad lived to be 15 years old.  I reviewed your office note from 02/05/2020.  Patient has a sister and 1 brother.  He is the youngest of all 3.  His sister has Lewy body dementia and brother has prostate cancer, neither 1 have a significant or similar tremor.  He has 3 grown children, 2 sons and 1 daughter, as far as he knows, neither 1 has a tremor.  He is somewhat bothered by the tremor but not severely.  He drinks decaf  coffee, about 2 cups/day and peach tea several servings per day.  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  He lives with his wife.  His Past Medical History Is Significant For: Past Medical History:  Diagnosis Date  . Alcohol abuse   . Allergy   . Back pain   . Cataract    forming  . Colon polyps   . Diverticulosis of colon   . DJD (degenerative joint disease)   . Dysrhythmia    Palpations from caffiene- reports he was having PACs due to slim fast wth caffeine - had full cardiac w/u and was told to cut back on caffeine;  now drink decaf bevrerages and no rpeort of recurrence   . Enlarged prostate   . GERD (gastroesophageal reflux disease)   . Hearing loss   . Hip osteoarthritis    Left  . History of hay fever   . Hx of cardiovascular stress test    Lexiscan Myoview (04/2013):  No ischemia, EF 61%; low risk  . Hyperlipemia   . Hypertension   . Insulin resistance    om metfromin   . Internal hemorrhoids without mention of complication   . Leg edema   . Neuroma of foot    left  . Obesity   . Osteoarthrosis, unspecified whether generalized or localized, unspecified site   . Palpitations   . 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 Primary localized osteoarthrosis of right shoulder  05/25/2016  . Recent weight loss    75lbs  . Seasonal allergies   . Sleep apnea    CPAP  . Thyroid nodule    calcified thyroid nodules- bx negative   . Tremor of both hands     His Past Surgical History Is Significant For: Past Surgical History:  Procedure Laterality Date  . COLONOSCOPY    . POLYPECTOMY    . SHOULDER SURGERY  July 13, 2007   right  . TONSILLECTOMY    . TOTAL HIP ARTHROPLASTY Left 03/28/2017   Procedure: TOTAL HIP ARTHROPLASTY ANTERIOR APPROACH;  Surgeon: Frederik Pear, MD;  Location: [facility];  Service: Orthopedics;  Laterality: Left;  . TOTAL HIP ARTHROPLASTY Right 09/25/2018   Procedure: Right Anterior Hip Arthroplasty;  Surgeon: Frederik Pear, MD;  Location: [facility];  Service:  Orthopedics;  Laterality: Right;  . TOTAL SHOULDER ARTHROPLASTY Left 10/14/2015   Procedure: LEFT TOTAL SHOULDER ARTHROPLASTY;  Surgeon: Marchia Bond, MD;  Location: [facility];  Service: Orthopedics;  Laterality: Left;  . TOTAL SHOULDER ARTHROPLASTY Right 05/25/2016   Procedure: TOTAL SHOULDER ARTHROPLASTY;  Surgeon: Marchia Bond, MD;  Location: [facility];  Service: Orthopedics;  Laterality: Right;    His Family History Is Significant For: Family History  Problem Relation Age of Onset  . Arthritis Mother   . Cancer Mother 59       breast  . Hyperlipidemia Mother   . Colon polyps Mother 74  . Hypertension Mother   . Heart disease Mother   . Depression Mother   . Anxiety disorder Mother   . Obesity Mother   . Breast cancer Mother   . Heart disease Father        CAD--passed away 13-Jul-2011 age 33  . Hyperlipidemia Father   . Hypertension Father   . Obesity Father   . Diabetes Father   . Depression Father   . Sleep apnea Father   . Cancer Sister 74       breast  . Breast cancer Sister   . Cancer Brother 29       prostate  . Prostate cancer Brother   . Breast cancer Other   . Prostate cancer Other   . Colon cancer Neg Hx   . Esophageal cancer Neg Hx   . Rectal cancer Neg Hx   . Stomach cancer Neg Hx     His Social History Is Significant For: Social History   Socioeconomic History  . Marital status: Married    Spouse name: Eric [name]   . Number of children: 3  . Years of education: Not on file  . Highest education level: Not on file  Occupational History  . Occupation: Retired--[employer]  Tobacco Use  . Smoking status: Never Smoker  . Smokeless tobacco: Never Used  Vaping Use  . Vaping Use: Never used  Substance and Sexual Activity  . Alcohol use: Yes    Alcohol/week: 0.0 standard drinks    Comment: rare----12 a year  . Drug use: No  . Sexual activity: Yes    Partners: Female  Other Topics Concern  . Not on file  Social History Narrative   Lives with  wife and daughter and two grandchildren.    Social Determinants of Health   Financial Resource Strain: Not on file  Food Insecurity: Not on file  Transportation Needs: Not on file  Physical Activity: Not on file  Stress: Not on file  Social Connections: Not on file    His Allergies Are:  Allergies  Allergen Reactions  . Lisinopril Cough  :   His Current Medications Are:  Outpatient Encounter Medications as of 04/24/2020  Medication Sig  . aspirin EC 81 MG tablet Take 1 tablet (81 mg total) by mouth 2 (two) times daily. (Patient taking differently: Take 81 mg by mouth once.)  . atorvastatin (LIPITOR) 40 MG tablet TAKE 1 TABLET DAILY  . buPROPion (WELLBUTRIN SR) 200 MG 12 hr tablet Take 1 tablet (200 mg total) by mouth daily.  . carbamide peroxide (DEBROX) 6.5 % OTIC solution Place 5 drops into both ears daily as needed (for ear wax).  . clindamycin (CLEOCIN-T) 1 % lotion Apply topically 2 (two) times daily.  . Coenzyme Q10 (COQ10) 200 MG CAPS Take 200 mg by mouth at bedtime.  . fluocinonide ointment (LIDEX) AB-123456789 % Apply 1 application topically daily as needed (for bites).  . fluticasone (FLONASE) 50 MCG/ACT nasal spray Place 2 sprays into both nostrils daily as needed for allergies.   [name] loratadine (CLARITIN) 10 MG tablet Take 10 mg by mouth daily as needed for allergies.   . meloxicam (MOBIC) 15 MG tablet TAKE 1 TABLET DAILY  . metFORMIN (GLUCOPHAGE) 500 MG tablet TAKE ONE TABLET BY MOUTH ONCE DAILY WITHBREAKFAST  . pantoprazole (PROTONIX) 40 MG tablet TAKE 1 TABLET DAILY  . tamsulosin (FLOMAX) 0.4 MG CAPS capsule TAKE 1 CAPSULE DAILY  . valsartan (DIOVAN) 80 MG tablet TAKE 1 TABLET DAILY  . Vitamin D, Ergocalciferol, (DRISDOL) 1.25 MG (50000 UNIT) CAPS capsule Take 1 capsule (50,000 Units total) by mouth every 7 (seven) days.  . Zinc 50 MG CAPS Take by mouth daily.   No facility-administered encounter medications on file as of 04/24/2020.  :   Review of Systems:  Out of a  complete 14 point review of systems, all are reviewed and negative with the exception of these symptoms as listed below:  Review of Systems  Neurological:       Here to consult on tremors. Pt reports sx are present in bilateral hands and lips.  He was recently started on Wellbutrin and feels like this med along with Caffeeine exhaserbates his sx.     Objective:  Neurological Exam  Physical Exam Physical Examination:   Vitals:   04/24/20 1012  BP: 136/80  Pulse: (!) 58  SpO2: 97%    General Examination: The patient is a very pleasant 75 y.o. male in no acute distress. He appears well-developed and well-nourished and well groomed.   HEENT: Normocephalic, atraumatic, pupils are equal, round and reactive to light and accommodation. Extraocular tracking is good without limitation to gaze excursion or nystagmus noted. Normal smooth pursuit is noted. Hearing is grossly intact.  He has hearing loss, right hearing aid in place, left side he took out because it kept falling out. Face is symmetric with normal facial animation and normal facial sensation. Speech is clear with no dysarthria noted. There is no hypophonia. There is an intermittent mild lower jaw tremor. Neck is supple with full range of passive and active motion. There are no carotid bruits on auscultation. Oropharynx exam reveals: mild mouth dryness, good dental hygiene and moderate airway crowding. Tongue protrudes centrally and palate elevates symmetrically.   Chest: Clear to auscultation without wheezing, rhonchi or crackles noted.  Heart: S1+S2+0, regular and normal without murmurs, rubs or gallops noted.  Bradycardia noted.  Abdomen: Soft, non-tender and non-distended with normal bowel sounds appreciated on auscultation.  Extremities: There is no pitting edema in the distal lower  extremities bilaterally.   Skin: Warm and dry without trophic changes noted.  Musculoskeletal: exam reveals no obvious joint deformities,  tenderness or joint swelling or erythema.  He is status post bilateral hip replacements as well as shoulder surgeries.   Neurologically:  Mental status: The patient is awake, alert and oriented in all 4 spheres. His immediate and remote memory, attention, language skills and fund of knowledge are appropriate. There is no evidence of aphasia, agnosia, apraxia or anomia. Speech is clear with normal prosody and enunciation. Thought process is linear. Mood is normal and affect is normal.  Cranial nerves II - XII are as described above under HEENT exam. In addition: shoulder shrug is normal with equal shoulder height noted. Motor exam: Normal bulk, strength and tone is noted. There is no drift, or rebound.   On 04/24/2020: On Archimedes spiral drawing he has insecurity with the left hand, no significant trembling noted with the right or left hand, handwriting is legible, not particularly tremulous and not micrographic.    He has a very mild bilateral upper extremity postural and action tremor, no significant intention tremor, no resting tremor.  Romberg is negative. Reflexes are 1+ in the upper extremities and absent in the lower extremities.  Toes are flexor bilaterally. Fine motor skills and coordination: intact with normal finger taps, normal hand movements, normal rapid alternating patting, normal foot taps and normal foot agility.  Cerebellar testing: No dysmetria or intention tremor on finger to nose testing. Heel to shin is unremarkable bilaterally. There is no truncal or gait ataxia.  Sensory exam: intact to light touch in the upper and lower extremities.  Gait, station and balance: He stands without difficulty, he has a slightly asymmetrical hip height.  He walks without shuffling, has preserved arm swing.  Assessment and Plan:  Assessment and Plan:  In summary, TYLIK LAFEVER is a very pleasant 75 y.o.-year old male with an underlying medical history of diverticulosis, hypertension,  hyperlipidemia, insulin resistance, vitamin D deficiency, edema, osteoarthritis, seasonal allergies, sleep apnea (uses CPAP), back pain, and obesity, who presents for evaluation of his tremor disorder of 10 or 12 years duration.  He has noticed a bilateral hand tremor which has progressed over time.  History and examination and family history are supportive of mild essential tremor.  I did not see any signs of parkinsonism.  He is reassured in this regard.  Findings are overall mild and we talked about triggers.  Typically, Wellbutrin is not a very strong trigger for tremors but other antidepressants such as SSRI type antidepressants can exacerbate tremors more likely.  We also talked about the connection with caffeine intake.  He is encouraged to limit his caffeine intake and stay well-hydrated with water.  I would not favor any symptomatic treatment for his tremor.  We talked about different options, a beta-blocker would not be a good choice for him because of his bradycardia.  He reports that he has a longstanding history of bradycardia.  He used to be a runner.  We could consider Mysoline and low-dose in the future but for now we mutually agreed to follow him clinically and monitor his symptoms and examination.  He is advised to follow-up routinely in about a year, sooner if needed.  I answered all his questions today and he was in agreement.  Thank you very much for allowing me to participate in the care of this nice patient. If I can be of any further assistance to you please do not hesitate  to call me at [phone].  Sincerely,   Star Age, MD, PhD

## 2020-04-24 NOTE — Telephone Encounter (Signed)
Spoke with patient to let him know claim is going to be re-filed with correct procedure code.  Asked pt to please allow for 30-45 days for re-submission.

## 2020-04-24 NOTE — Patient Instructions (Signed)
You have a mild tremor of both hands.  I do not see any signs or symptoms of parkinson's like disease or what we call parkinsonism.   For your tremor, I would not recommend any new medication for fear of side effects (especially sleepiness or bradycardia) or medication interactions, especially in light of you already taking several meds. We can monitor your symptoms and exam and have you follow up routinely in about a year, sooner if we need to.  You can always call or email through [place] if you would like to be seen sooner or if you feel rather well, we can also push up your appointment date.    Please remember, that any kind of tremor may be exacerbated by anxiety, anger, nervousness, excitement, dehydration, sleep deprivation, by caffeine, and low blood sugar values or blood sugar fluctuations and thyroid dysfunction. Some medications can exacerbate tremors, this includes certain antidepressant medications but typically not Wellbutrin.

## 2020-04-25 DIAGNOSIS — M5451 Vertebrogenic low back pain: Secondary | ICD-10-CM | POA: Diagnosis not present

## 2020-04-25 DIAGNOSIS — M48061 Spinal stenosis, lumbar region without neurogenic claudication: Secondary | ICD-10-CM | POA: Diagnosis not present

## 2020-04-28 DIAGNOSIS — M48061 Spinal stenosis, lumbar region without neurogenic claudication: Secondary | ICD-10-CM | POA: Diagnosis not present

## 2020-04-28 DIAGNOSIS — M5451 Vertebrogenic low back pain: Secondary | ICD-10-CM | POA: Diagnosis not present

## 2020-05-01 DIAGNOSIS — M48061 Spinal stenosis, lumbar region without neurogenic claudication: Secondary | ICD-10-CM | POA: Diagnosis not present

## 2020-05-01 DIAGNOSIS — M5451 Vertebrogenic low back pain: Secondary | ICD-10-CM | POA: Diagnosis not present

## 2020-05-05 ENCOUNTER — Other Ambulatory Visit: Payer: Self-pay

## 2020-05-05 ENCOUNTER — Encounter (INDEPENDENT_AMBULATORY_CARE_PROVIDER_SITE_OTHER): Payer: Self-pay | Admitting: Family Medicine

## 2020-05-05 ENCOUNTER — Ambulatory Visit (INDEPENDENT_AMBULATORY_CARE_PROVIDER_SITE_OTHER): Payer: Medicare Other | Admitting: Family Medicine

## 2020-05-05 VITALS — HR 51 | Temp 97.6°F | Ht 68.0 in | Wt 236.0 lb

## 2020-05-05 DIAGNOSIS — E559 Vitamin D deficiency, unspecified: Secondary | ICD-10-CM | POA: Diagnosis not present

## 2020-05-05 DIAGNOSIS — Z6836 Body mass index (BMI) 36.0-36.9, adult: Secondary | ICD-10-CM

## 2020-05-05 DIAGNOSIS — E8881 Metabolic syndrome: Secondary | ICD-10-CM | POA: Diagnosis not present

## 2020-05-05 DIAGNOSIS — F3289 Other specified depressive episodes: Secondary | ICD-10-CM

## 2020-05-05 MED ORDER — VITAMIN D (ERGOCALCIFEROL) 1.25 MG (50000 UNIT) PO CAPS: 50000.0000 [IU] | ORAL_CAPSULE | ORAL | 0 refills | Status: DC

## 2020-05-05 MED ORDER — METFORMIN HCL 500 MG PO TABS: ORAL_TABLET | ORAL | 0 refills | Status: DC

## 2020-05-05 MED ORDER — BUPROPION HCL ER (SR) 200 MG PO TB12: 200.0000 mg | ORAL_TABLET | Freq: Every day | ORAL | 0 refills | Status: DC

## 2020-05-06 DIAGNOSIS — M48061 Spinal stenosis, lumbar region without neurogenic claudication: Secondary | ICD-10-CM | POA: Diagnosis not present

## 2020-05-06 DIAGNOSIS — M5451 Vertebrogenic low back pain: Secondary | ICD-10-CM | POA: Diagnosis not present

## 2020-05-06 NOTE — Progress Notes (Signed)
Chief Complaint:   OBESITY Eric [name] is here to discuss his progress with his obesity treatment plan along with follow-up of his obesity related diagnoses. Eric [name] is on the Category 3 Plan and states he is following his eating plan approximately 75% of the time. Eric [name] states he is doing physical therapy, bike riding, and walking 3-4 times per week.  Today's visit was #: 48 Starting weight: 288 lbs Starting date: 12/14/2017 Today's weight: 236 lbs Today's date: 05/05/2020 Total lbs lost to date: 52 Total lbs lost since last in-office visit: 0  Interim History: Eric [name] has started physical therapy and he has been doing more exercising especially on care. 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  Subjective:   1. Insulin resistance Eric [name] is working on diet, exercise, and weight loss, and he is trying to decrease simple carbohydrates in his diet. He is tolerating metformin well.  2. Vitamin D deficiency Eric [name] is stable on Vit D, and he requests a refill today. He is doing well remembering to take it regularly.  3. Other depression with emotional eating Eric [name] continues to do well with medications. He is mindful of his emotional eating behaviors, and he is working on reducing them. His blood pressure is stable.  Assessment/Plan:   1. Insulin resistance Eric [name] will continue to work on weight loss, diet, exercise, and decreasing simple carbohydrates to help decrease the risk of diabetes. We will refill metformin for 1 month. Eric [name] agreed to follow-up with [place] as directed to closely monitor his progress.  - metFORMIN (GLUCOPHAGE) 500 MG tablet; TAKE ONE TABLET BY MOUTH ONCE DAILY WITHBREAKFAST  Dispense: 30 tablet; Refill: 0  2. Vitamin D deficiency Low Vitamin D level contributes to fatigue and are associated with obesity, breast, and colon cancer. We will refill prescription Vitamin D for  1 month. Eric [name] will follow-up for routine testing of Vitamin D, at least 2-3 times per year to avoid over-replacement.  - Vitamin D, Ergocalciferol, (DRISDOL) 1.25 MG (50000 UNIT) CAPS capsule; Take 1 capsule (50,000 Units total) by mouth every 7 (seven) days.  Dispense: 4 capsule; Refill: 0  3. Other depression with emotional eating Behavior modification techniques were discussed today to help Eric [name] deal with his emotional/non-hunger eating behaviors. We will refill Wellbutrin SR for 1 month, and will continue to monitor. Orders and follow up as documented in patient record.   - buPROPion (WELLBUTRIN SR) 200 MG 12 hr tablet; Take 1 tablet (200 mg total) by mouth daily.  Dispense: 30 tablet; Refill: 0  4. Class 2 severe obesity with serious comorbidity and body mass index (BMI) of 36.0 to 36.9 in adult, unspecified obesity type Eric [name] is currently in the action stage of change. As such, his goal is to continue with weight loss efforts. He has agreed to the Category 3 Plan.   Exercise goals: As is.  Behavioral modification strategies: increasing lean protein intake, travel eating strategies and holiday eating strategies .  Eric [name] has agreed to follow-up with our clinic in 3 to 4 weeks. He was informed of the importance of frequent follow-up visits to maximize his success with intensive lifestyle modifications for his multiple health conditions.   Objective:   Pulse (!) 51, temperature 97.6 F (36.4 C), height 5\' 8"  (1.727 m), weight 236 lb (107 kg), SpO2 98 %. Body mass index is 35.88 kg/m.  General: Cooperative, alert, well developed, in no  acute distress. HEENT: Conjunctivae and lids unremarkable. Cardiovascular: Regular rhythm.  Lungs: Normal work of breathing. Neurologic: No focal deficits.   Lab Results  Component Value Date   CREATININE 1.00 03/03/2020   BUN 18 03/03/2020   NA 139 03/03/2020   K 4.3 03/03/2020   CL 102 03/03/2020   CO2 31 03/03/2020   Lab  Results  Component Value Date   ALT 16 03/03/2020   AST 13 03/03/2020   ALKPHOS 56 03/03/2020   BILITOT 0.8 03/03/2020   Lab Results  Component Value Date   HGBA1C 5.4 03/03/2020   HGBA1C 5.6 10/22/2019   HGBA1C 5.3 06/18/2019   HGBA1C 5.5 03/02/2019   HGBA1C 5.6 08/29/2018   Lab Results  Component Value Date   INSULIN 9.0 10/22/2019   INSULIN 21.3 12/14/2017   Lab Results  Component Value Date   TSH 1.060 12/14/2017   Lab Results  Component Value Date   CHOL 131 03/03/2020   HDL 46.10 03/03/2020   LDLCALC 70 03/03/2020   TRIG 78.0 03/03/2020   CHOLHDL 3 03/03/2020   Lab Results  Component Value Date   WBC 4.9 03/03/2020   HGB 15.5 03/03/2020   HCT 46.7 03/03/2020   MCV 92.9 03/03/2020   PLT 178.0 03/03/2020   No results found for: IRON, TIBC, FERRITIN  Obesity Behavioral Intervention:   Approximately 15 minutes were spent on the discussion below.  ASK: We discussed the diagnosis of obesity with Eric [name] today and Eric [name] agreed to give [place] permission to discuss obesity behavioral modification therapy today.  ASSESS: Eric [name] has the diagnosis of obesity and his BMI today is 35.89. Eric [name] is in the action stage of change.   ADVISE: Eric [name] was educated on the multiple health risks of obesity as well as the benefit of weight loss to improve his health. He was advised of the need for long term treatment and the importance of lifestyle modifications to improve his current health and to decrease his risk of future health problems.  AGREE: Multiple dietary modification options and treatment options were discussed and Eric [name] agreed to follow the recommendations documented in the above note.  ARRANGE: Mohd. was educated on the importance of frequent visits to treat obesity as outlined per CMS and USPSTF guidelines and agreed to schedule his next follow up appointment today.  Attestation Statements:   Reviewed by clinician on day of visit: allergies,  medications, problem list, medical history, surgical history, family history, social history, and previous encounter notes.   I, Trixie Dredge, am acting as transcriptionist for [name], MD.  I have reviewed the above documentation for accuracy and completeness, and I agree with the above. -  [name], MD

## 2020-05-19 DIAGNOSIS — M5451 Vertebrogenic low back pain: Secondary | ICD-10-CM | POA: Diagnosis not present

## 2020-05-19 DIAGNOSIS — M48061 Spinal stenosis, lumbar region without neurogenic claudication: Secondary | ICD-10-CM | POA: Diagnosis not present

## 2020-05-22 DIAGNOSIS — M5451 Vertebrogenic low back pain: Secondary | ICD-10-CM | POA: Diagnosis not present

## 2020-05-22 DIAGNOSIS — M48061 Spinal stenosis, lumbar region without neurogenic claudication: Secondary | ICD-10-CM | POA: Diagnosis not present

## 2020-05-23 DIAGNOSIS — M19011 Primary osteoarthritis, right shoulder: Secondary | ICD-10-CM | POA: Diagnosis not present

## 2020-05-23 DIAGNOSIS — M19012 Primary osteoarthritis, left shoulder: Secondary | ICD-10-CM | POA: Diagnosis not present

## 2020-05-26 DIAGNOSIS — M48061 Spinal stenosis, lumbar region without neurogenic claudication: Secondary | ICD-10-CM | POA: Diagnosis not present

## 2020-05-26 DIAGNOSIS — M5451 Vertebrogenic low back pain: Secondary | ICD-10-CM | POA: Diagnosis not present

## 2020-05-27 DIAGNOSIS — Z6836 Body mass index (BMI) 36.0-36.9, adult: Secondary | ICD-10-CM | POA: Diagnosis not present

## 2020-05-27 DIAGNOSIS — M4807 Spinal stenosis, lumbosacral region: Secondary | ICD-10-CM | POA: Diagnosis not present

## 2020-05-27 DIAGNOSIS — I10 Essential (primary) hypertension: Secondary | ICD-10-CM | POA: Diagnosis not present

## 2020-05-29 DIAGNOSIS — M5451 Vertebrogenic low back pain: Secondary | ICD-10-CM | POA: Diagnosis not present

## 2020-05-29 DIAGNOSIS — M48061 Spinal stenosis, lumbar region without neurogenic claudication: Secondary | ICD-10-CM | POA: Diagnosis not present

## 2020-06-02 ENCOUNTER — Ambulatory Visit (INDEPENDENT_AMBULATORY_CARE_PROVIDER_SITE_OTHER): Payer: Medicare Other | Admitting: Family Medicine

## 2020-06-02 ENCOUNTER — Other Ambulatory Visit: Payer: Self-pay

## 2020-06-02 ENCOUNTER — Other Ambulatory Visit (INDEPENDENT_AMBULATORY_CARE_PROVIDER_SITE_OTHER): Payer: Self-pay | Admitting: Family Medicine

## 2020-06-02 VITALS — BP 108/74 | HR 82 | Temp 98.4°F | Ht 68.0 in | Wt 233.0 lb

## 2020-06-02 DIAGNOSIS — E559 Vitamin D deficiency, unspecified: Secondary | ICD-10-CM

## 2020-06-02 DIAGNOSIS — M48061 Spinal stenosis, lumbar region without neurogenic claudication: Secondary | ICD-10-CM | POA: Diagnosis not present

## 2020-06-02 DIAGNOSIS — F3289 Other specified depressive episodes: Secondary | ICD-10-CM

## 2020-06-02 DIAGNOSIS — Z6835 Body mass index (BMI) 35.0-35.9, adult: Secondary | ICD-10-CM | POA: Diagnosis not present

## 2020-06-02 DIAGNOSIS — M5451 Vertebrogenic low back pain: Secondary | ICD-10-CM | POA: Diagnosis not present

## 2020-06-02 DIAGNOSIS — R7303 Prediabetes: Secondary | ICD-10-CM

## 2020-06-02 DIAGNOSIS — E8881 Metabolic syndrome: Secondary | ICD-10-CM

## 2020-06-02 MED ORDER — METFORMIN HCL 500 MG PO TABS: ORAL_TABLET | ORAL | 0 refills | Status: DC

## 2020-06-02 MED ORDER — BUPROPION HCL ER (SR) 200 MG PO TB12: 200.0000 mg | ORAL_TABLET | Freq: Every day | ORAL | 0 refills | Status: DC

## 2020-06-02 MED ORDER — VITAMIN D (ERGOCALCIFEROL) 1.25 MG (50000 UNIT) PO CAPS: 50000.0000 [IU] | ORAL_CAPSULE | ORAL | 0 refills | Status: DC

## 2020-06-04 NOTE — Progress Notes (Signed)
Chief Complaint:   OBESITY Eric [name] is here to discuss his progress with his obesity treatment plan along with follow-up of his obesity related diagnoses. Eric [name] is on the Category 3 Plan and states he is following his eating plan approximately 75% of the time. Eric [name] states he is doing physical therapy (riding the stationary bike) for 60 minutes 2 times per week.  Today's visit was #: 51 Starting weight: 288 lbs Starting date: 12/14/2017 Today's weight: 233 lbs Today's date: 06/02/2020 Total lbs lost to date: 55 Total lbs lost since last in-office visit: 3  Interim History: Eric [name] continues to well with weight loss. He has been traveling more and is mindful of his choices. His hunger is mostly controlled.  Subjective:   1. Vitamin D deficiency Eric [name] is stable on Vit D, and he denies signs of over-replacement.  2. Pre-diabetes Eric [name] is tolerating metformin well. He requests a refill today. He is doing well with weight loss.  3. Other depression with emotional eating Eric [name]'s mood is stable on his medications. He is doing well minimizing emotional eating behaviors.   Assessment/Plan:   1. Vitamin D deficiency Low Vitamin D level contributes to fatigue and are associated with obesity, breast, and colon cancer. We will refill prescription Vitamin D for 1 month. Eric [name] will follow-up for routine testing of Vitamin D, at least 2-3 times per year to avoid over-replacement.  - Vitamin D, Ergocalciferol, (DRISDOL) 1.25 MG (50000 UNIT) CAPS capsule; Take 1 capsule (50,000 Units total) by mouth every 7 (seven) days.  Dispense: 4 capsule; Refill: 0  2. Pre-diabetes Eric [name] will continue to work on weight loss, exercise, and decreasing simple carbohydrates to help decrease the risk of diabetes. We will refill metformin for 1 month.   - metFORMIN (GLUCOPHAGE) 500 MG tablet; TAKE ONE TABLET BY MOUTH ONCE DAILY WITHBREAKFAST  Dispense: 30 tablet; Refill: 0  3. Other depression  with emotional eating Behavior modification techniques were discussed today to help Eric [name] deal with his emotional/non-hunger eating behaviors. We will refill Wellbutrin SR for 1 month. Orders and follow up as documented in patient record.   - buPROPion (WELLBUTRIN SR) 200 MG 12 hr tablet; Take 1 tablet (200 mg total) by mouth daily.  Dispense: 30 tablet; Refill: 0  4. Class 2 severe obesity with serious comorbidity and body mass index (BMI) of 35.0 to 35.9 in adult, unspecified obesity type Eric [name]) Eric [name] is currently in the action stage of change. As such, his goal is to continue with weight loss efforts. He has agreed to the Category 3 Plan.   Exercise goals: As is.  Behavioral modification strategies: meal planning and cooking strategies.  Eric [name] has agreed to follow-up with our clinic in 4 weeks. He was informed of the importance of frequent follow-up visits to maximize his success with intensive lifestyle modifications for his multiple health conditions.   Objective:   Blood pressure 108/74, pulse 82, temperature 98.4 F (36.9 C), height 5\' 8"  (1.727 m), weight 233 lb (105.7 kg), SpO2 96 %. Body mass index is 35.43 kg/m.  General: Cooperative, alert, well developed, in no acute distress. HEENT: Conjunctivae and lids unremarkable. Cardiovascular: Regular rhythm.  Lungs: Normal work of breathing. Neurologic: No focal deficits.   Lab Results  Component Value Date   CREATININE 1.00 03/03/2020   BUN 18 03/03/2020   NA 139 03/03/2020   K 4.3 03/03/2020   CL 102 03/03/2020   CO2 31 03/03/2020   Lab Results  Component Value Date  ALT 16 03/03/2020   AST 13 03/03/2020   ALKPHOS 56 03/03/2020   BILITOT 0.8 03/03/2020   Lab Results  Component Value Date   HGBA1C 5.4 03/03/2020   HGBA1C 5.6 10/22/2019   HGBA1C 5.3 06/18/2019   HGBA1C 5.5 03/02/2019   HGBA1C 5.6 08/29/2018   Lab Results  Component Value Date   INSULIN 9.0 10/22/2019   INSULIN 21.3 12/14/2017   Lab  Results  Component Value Date   TSH 1.060 12/14/2017   Lab Results  Component Value Date   CHOL 131 03/03/2020   HDL 46.10 03/03/2020   LDLCALC 70 03/03/2020   TRIG 78.0 03/03/2020   CHOLHDL 3 03/03/2020   Lab Results  Component Value Date   WBC 4.9 03/03/2020   HGB 15.5 03/03/2020   HCT 46.7 03/03/2020   MCV 92.9 03/03/2020   PLT 178.0 03/03/2020   No results found for: IRON, TIBC, FERRITIN  Obesity Behavioral Intervention:   Approximately 15 minutes were spent on the discussion below.  ASK: We discussed the diagnosis of obesity with Eric [name] today and Eric [name] agreed to give [place] permission to discuss obesity behavioral modification therapy today.  ASSESS: Eric [name] has the diagnosis of obesity and his BMI today is 35.44. Eric [name] is in the action stage of change.   ADVISE: Eric [name] was educated on the multiple health risks of obesity as well as the benefit of weight loss to improve his health. He was advised of the need for long term treatment and the importance of lifestyle modifications to improve his current health and to decrease his risk of future health problems.  AGREE: Multiple dietary modification options and treatment options were discussed and Eric [name] agreed to follow the recommendations documented in the above note.  ARRANGE: Eric [name] was educated on the importance of frequent visits to treat obesity as outlined per CMS and USPSTF guidelines and agreed to schedule his next follow up appointment today.  Attestation Statements:   Reviewed by clinician on day of visit: allergies, medications, problem list, medical history, surgical history, family history, social history, and previous encounter notes.   I, Trixie Dredge, am acting as transcriptionist for [name], MD.  I have reviewed the above documentation for accuracy and completeness, and I agree with the above. -  [name], MD

## 2020-06-05 DIAGNOSIS — M5451 Vertebrogenic low back pain: Secondary | ICD-10-CM | POA: Diagnosis not present

## 2020-06-05 DIAGNOSIS — M48061 Spinal stenosis, lumbar region without neurogenic claudication: Secondary | ICD-10-CM | POA: Diagnosis not present

## 2020-06-09 DIAGNOSIS — M5451 Vertebrogenic low back pain: Secondary | ICD-10-CM | POA: Diagnosis not present

## 2020-06-09 DIAGNOSIS — M48061 Spinal stenosis, lumbar region without neurogenic claudication: Secondary | ICD-10-CM | POA: Diagnosis not present

## 2020-06-10 DIAGNOSIS — E119 Type 2 diabetes mellitus without complications: Secondary | ICD-10-CM | POA: Diagnosis not present

## 2020-06-10 DIAGNOSIS — M47816 Spondylosis without myelopathy or radiculopathy, lumbar region: Secondary | ICD-10-CM | POA: Diagnosis not present

## 2020-06-10 DIAGNOSIS — Z7984 Long term (current) use of oral hypoglycemic drugs: Secondary | ICD-10-CM | POA: Diagnosis not present

## 2020-06-10 DIAGNOSIS — I10 Essential (primary) hypertension: Secondary | ICD-10-CM | POA: Diagnosis not present

## 2020-06-10 DIAGNOSIS — M5136 Other intervertebral disc degeneration, lumbar region: Secondary | ICD-10-CM | POA: Diagnosis not present

## 2020-06-12 DIAGNOSIS — M5451 Vertebrogenic low back pain: Secondary | ICD-10-CM | POA: Diagnosis not present

## 2020-06-12 DIAGNOSIS — M48061 Spinal stenosis, lumbar region without neurogenic claudication: Secondary | ICD-10-CM | POA: Diagnosis not present

## 2020-06-17 DIAGNOSIS — M5451 Vertebrogenic low back pain: Secondary | ICD-10-CM | POA: Diagnosis not present

## 2020-06-17 DIAGNOSIS — M48061 Spinal stenosis, lumbar region without neurogenic claudication: Secondary | ICD-10-CM | POA: Diagnosis not present

## 2020-06-19 DIAGNOSIS — M48061 Spinal stenosis, lumbar region without neurogenic claudication: Secondary | ICD-10-CM | POA: Diagnosis not present

## 2020-06-19 DIAGNOSIS — M5451 Vertebrogenic low back pain: Secondary | ICD-10-CM | POA: Diagnosis not present

## 2020-06-22 ENCOUNTER — Other Ambulatory Visit: Payer: Self-pay | Admitting: Family Medicine

## 2020-06-23 DIAGNOSIS — M48061 Spinal stenosis, lumbar region without neurogenic claudication: Secondary | ICD-10-CM | POA: Diagnosis not present

## 2020-06-23 DIAGNOSIS — M5451 Vertebrogenic low back pain: Secondary | ICD-10-CM | POA: Diagnosis not present

## 2020-06-27 DIAGNOSIS — M48061 Spinal stenosis, lumbar region without neurogenic claudication: Secondary | ICD-10-CM | POA: Diagnosis not present

## 2020-06-27 DIAGNOSIS — M5451 Vertebrogenic low back pain: Secondary | ICD-10-CM | POA: Diagnosis not present

## 2020-07-01 ENCOUNTER — Other Ambulatory Visit (INDEPENDENT_AMBULATORY_CARE_PROVIDER_SITE_OTHER): Payer: Self-pay | Admitting: Family Medicine

## 2020-07-01 DIAGNOSIS — F3289 Other specified depressive episodes: Secondary | ICD-10-CM

## 2020-07-01 DIAGNOSIS — R7303 Prediabetes: Secondary | ICD-10-CM

## 2020-07-01 DIAGNOSIS — M5451 Vertebrogenic low back pain: Secondary | ICD-10-CM | POA: Diagnosis not present

## 2020-07-01 DIAGNOSIS — M48061 Spinal stenosis, lumbar region without neurogenic claudication: Secondary | ICD-10-CM | POA: Diagnosis not present

## 2020-07-01 NOTE — Telephone Encounter (Signed)
Ok x 1

## 2020-07-01 NOTE — Telephone Encounter (Signed)
Would you like to refill or have pt wait until next appt?

## 2020-07-03 DIAGNOSIS — M5451 Vertebrogenic low back pain: Secondary | ICD-10-CM | POA: Diagnosis not present

## 2020-07-03 DIAGNOSIS — M48061 Spinal stenosis, lumbar region without neurogenic claudication: Secondary | ICD-10-CM | POA: Diagnosis not present

## 2020-07-07 ENCOUNTER — Ambulatory Visit (INDEPENDENT_AMBULATORY_CARE_PROVIDER_SITE_OTHER): Payer: Medicare Other | Admitting: Family Medicine

## 2020-07-07 ENCOUNTER — Encounter (INDEPENDENT_AMBULATORY_CARE_PROVIDER_SITE_OTHER): Payer: Self-pay | Admitting: Family Medicine

## 2020-07-07 ENCOUNTER — Other Ambulatory Visit: Payer: Self-pay

## 2020-07-07 VITALS — BP 137/77 | HR 51 | Temp 98.4°F | Ht 68.0 in | Wt 233.0 lb

## 2020-07-07 DIAGNOSIS — F3289 Other specified depressive episodes: Secondary | ICD-10-CM

## 2020-07-07 DIAGNOSIS — Z6841 Body Mass Index (BMI) 40.0 and over, adult: Secondary | ICD-10-CM

## 2020-07-07 DIAGNOSIS — M5136 Other intervertebral disc degeneration, lumbar region: Secondary | ICD-10-CM | POA: Diagnosis not present

## 2020-07-07 DIAGNOSIS — E559 Vitamin D deficiency, unspecified: Secondary | ICD-10-CM | POA: Diagnosis not present

## 2020-07-07 DIAGNOSIS — M47816 Spondylosis without myelopathy or radiculopathy, lumbar region: Secondary | ICD-10-CM | POA: Diagnosis not present

## 2020-07-07 MED ORDER — BUPROPION HCL ER (SR) 200 MG PO TB12: 200.0000 mg | ORAL_TABLET | Freq: Every day | ORAL | 0 refills | Status: DC

## 2020-07-07 MED ORDER — VITAMIN D (ERGOCALCIFEROL) 1.25 MG (50000 UNIT) PO CAPS: 50000.0000 [IU] | ORAL_CAPSULE | ORAL | 0 refills | Status: DC

## 2020-07-08 DIAGNOSIS — M5451 Vertebrogenic low back pain: Secondary | ICD-10-CM | POA: Diagnosis not present

## 2020-07-08 DIAGNOSIS — M48061 Spinal stenosis, lumbar region without neurogenic claudication: Secondary | ICD-10-CM | POA: Diagnosis not present

## 2020-07-10 NOTE — Progress Notes (Signed)
Chief Complaint:   OBESITY Eric [name] is here to discuss his progress with his obesity treatment plan along with follow-up of his obesity related diagnoses. Eric [name] is on the Category 3 Plan and states he is following his eating plan approximately 75% of the time. Eric [name] states he is doing yard work, fishing, and physical therapy for 90 minutes 2 times per week.  Today's visit was #: 65 Starting weight: 288 lbs Starting date: 12/14/2017 Today's weight: 233 lbs Today's date: 07/07/2020 Total lbs lost to date: 55 Total lbs lost since last in-office visit: 0  Interim History: Eric [name] has done very well maintaining his weight loss this last month. He is doing physical therapy to help his back, and he plans to do a lot of fishing the next 2 weeks.  Subjective:   1. Vitamin D deficiency Eric [name] is stable on Vit D, and he denies signs of over-replacement. He is tolerating Vit D well.  2. Other depression with emotional eating Eric [name]'s mood is stable on his medications, and he is working on decreasing emotional eating behaviors. He is doing well overall.  Assessment/Plan:   1. Vitamin D deficiency Low Vitamin D level contributes to fatigue and are associated with obesity, breast, and colon cancer. We will refill prescription Vitamin D for 1 month. Eric [name] will follow-up for routine testing of Vitamin D, at least 2-3 times per year to avoid over-replacement.  - Vitamin D, Ergocalciferol, (DRISDOL) 1.25 MG (50000 UNIT) CAPS capsule; Take 1 capsule (50,000 Units total) by mouth every 7 (seven) days.  Dispense: 4 capsule; Refill: 0  2. Other depression with emotional eating Behavior modification techniques were discussed today to help Eric [name] deal with his emotional/non-hunger eating behaviors. We will refill Wellbutrin SR for 1 month. Orders and follow up as documented in patient record.   - buPROPion (WELLBUTRIN SR) 200 MG 12 hr tablet; Take 1 tablet (200 mg total) by mouth daily.   Dispense: 30 tablet; Refill: 0  3. Obesity with current BMI of 35.5 Eric [name] is currently in the action stage of change. As such, his goal is to continue with weight loss efforts. He has agreed to the Category 3 Plan.   Exercise goals: As is.  Behavioral modification strategies: meal planning and cooking strategies and celebration eating strategies.  Eric [name] has agreed to follow-up with our clinic in 4 weeks. He was informed of the importance of frequent follow-up visits to maximize his success with intensive lifestyle modifications for his multiple health conditions.   Objective:   Blood pressure 137/77, pulse (!) 51, temperature 98.4 F (36.9 C), height 5\' 8"  (1.727 m), weight 233 lb (105.7 kg), SpO2 99 %. Body mass index is 35.43 kg/m.  General: Cooperative, alert, well developed, in no acute distress. HEENT: Conjunctivae and lids unremarkable. Cardiovascular: Regular rhythm.  Lungs: Normal work of breathing. Neurologic: No focal deficits.   Lab Results  Component Value Date   CREATININE 1.00 03/03/2020   BUN 18 03/03/2020   NA 139 03/03/2020   K 4.3 03/03/2020   CL 102 03/03/2020   CO2 31 03/03/2020   Lab Results  Component Value Date   ALT 16 03/03/2020   AST 13 03/03/2020   ALKPHOS 56 03/03/2020   BILITOT 0.8 03/03/2020   Lab Results  Component Value Date   HGBA1C 5.4 03/03/2020   HGBA1C 5.6 10/22/2019   HGBA1C 5.3 06/18/2019   HGBA1C 5.5 03/02/2019   HGBA1C 5.6 08/29/2018   Lab Results  Component Value Date  INSULIN 9.0 10/22/2019   INSULIN 21.3 12/14/2017   Lab Results  Component Value Date   TSH 1.060 12/14/2017   Lab Results  Component Value Date   CHOL 131 03/03/2020   HDL 46.10 03/03/2020   LDLCALC 70 03/03/2020   TRIG 78.0 03/03/2020   CHOLHDL 3 03/03/2020   Lab Results  Component Value Date   WBC 4.9 03/03/2020   HGB 15.5 03/03/2020   HCT 46.7 03/03/2020   MCV 92.9 03/03/2020   PLT 178.0 03/03/2020   No results found for: IRON,  TIBC, FERRITIN  Attestation Statements:   Reviewed by clinician on day of visit: allergies, medications, problem list, medical history, surgical history, family history, social history, and previous encounter notes.  Time spent on visit including pre-visit chart review and post-visit care and charting was 36 minutes.    I, Trixie Dredge, am acting as transcriptionist for [name], MD.  I have reviewed the above documentation for accuracy and completeness, and I agree with the above. -  [name], MD

## 2020-07-14 DIAGNOSIS — M5451 Vertebrogenic low back pain: Secondary | ICD-10-CM | POA: Diagnosis not present

## 2020-07-14 DIAGNOSIS — M48061 Spinal stenosis, lumbar region without neurogenic claudication: Secondary | ICD-10-CM | POA: Diagnosis not present

## 2020-07-17 DIAGNOSIS — L570 Actinic keratosis: Secondary | ICD-10-CM | POA: Diagnosis not present

## 2020-07-22 DIAGNOSIS — M5451 Vertebrogenic low back pain: Secondary | ICD-10-CM | POA: Diagnosis not present

## 2020-07-22 DIAGNOSIS — M48061 Spinal stenosis, lumbar region without neurogenic claudication: Secondary | ICD-10-CM | POA: Diagnosis not present

## 2020-07-24 DIAGNOSIS — M5451 Vertebrogenic low back pain: Secondary | ICD-10-CM | POA: Diagnosis not present

## 2020-07-24 DIAGNOSIS — M48061 Spinal stenosis, lumbar region without neurogenic claudication: Secondary | ICD-10-CM | POA: Diagnosis not present

## 2020-07-28 ENCOUNTER — Other Ambulatory Visit (INDEPENDENT_AMBULATORY_CARE_PROVIDER_SITE_OTHER): Payer: Self-pay | Admitting: Family Medicine

## 2020-07-28 DIAGNOSIS — R7303 Prediabetes: Secondary | ICD-10-CM

## 2020-07-28 NOTE — Telephone Encounter (Signed)
Pt last seen by Dr. Beasley.  

## 2020-07-30 NOTE — Telephone Encounter (Signed)
okx1

## 2020-07-30 NOTE — Telephone Encounter (Signed)
Patient is requesting a refill of the following medications: Requested Prescriptions   Pending Prescriptions Disp Refills   metFORMIN (GLUCOPHAGE) 500 MG tablet [Pharmacy Med Name: METFORMIN HCL 500 MG TAB] 30 tablet 0    Sig: TAKE 1 TABLET BY MOUTH ONCE DAILY WITH BREAKFAST    Last office visit: 07/07/20 Date of last refill: 07/01/20 Last refill amount: 30 Follow up time period per chart: 4 week Next scheduled appt: 08/04/20

## 2020-08-04 ENCOUNTER — Other Ambulatory Visit: Payer: Self-pay

## 2020-08-04 ENCOUNTER — Ambulatory Visit (INDEPENDENT_AMBULATORY_CARE_PROVIDER_SITE_OTHER): Payer: Medicare Other | Admitting: Family Medicine

## 2020-08-04 ENCOUNTER — Encounter (INDEPENDENT_AMBULATORY_CARE_PROVIDER_SITE_OTHER): Payer: Self-pay | Admitting: Family Medicine

## 2020-08-04 VITALS — BP 112/63 | HR 52 | Temp 97.8°F | Ht 68.0 in | Wt 232.0 lb

## 2020-08-04 DIAGNOSIS — Z6841 Body Mass Index (BMI) 40.0 and over, adult: Secondary | ICD-10-CM

## 2020-08-04 DIAGNOSIS — E559 Vitamin D deficiency, unspecified: Secondary | ICD-10-CM | POA: Diagnosis not present

## 2020-08-04 DIAGNOSIS — F3289 Other specified depressive episodes: Secondary | ICD-10-CM | POA: Diagnosis not present

## 2020-08-04 MED ORDER — VITAMIN D (ERGOCALCIFEROL) 1.25 MG (50000 UNIT) PO CAPS: 50000.0000 [IU] | ORAL_CAPSULE | ORAL | 0 refills | Status: DC

## 2020-08-05 NOTE — Progress Notes (Signed)
Chief Complaint:   OBESITY Eric [name] is here to discuss his progress with his obesity treatment plan along with follow-up of his obesity related diagnoses. Eric [name] is on the Category 3 Plan and states he is following his eating plan approximately 50% of the time. Eric [name] states he is doing physical therapy for 1 month for 1 hour and 30 minutes 2 times per week.  Today's visit was #: 61 Starting weight: 228 lbs Starting date: 12/14/2017 Today's weight: 232 lbs Today's date: 08/04/2020 Total lbs lost to date: 72 Total lbs lost since last in-office visit: 1  Interim History: Eric [name] continues to do well with weight loss. He has been exercising more at physical therapy, and he is meeting his protein goals often.  Subjective:   1. Vitamin D deficiency Eric [name] is on Vit D, and he denies signs of over-replacement.  2. Other depression with emotional eating Eric [name] feels he no longer needs the Wellbutrin, and he is hoping  To come off of it. His mood is stable and he is still working to minimize emotional eating behaviors.  Assessment/Plan:   1. Vitamin D deficiency Low Vitamin D level contributes to fatigue and are associated with obesity, breast, and colon cancer. We will refill prescription Vitamin D for 1 month. Eric [name] will follow-up for routine testing of Vitamin D, at least 2-3 times per year to avoid over-replacement.  - Vitamin D, Ergocalciferol, (DRISDOL) 1.25 MG (50000 UNIT) CAPS capsule; Take 1 capsule (50,000 Units total) by mouth every 7 (seven) days.  Dispense: 4 capsule; Refill: 0  2. Other depression with emotional eating Behavior modification techniques were discussed today to help Eric [name] deal with his emotional/non-hunger eating behaviors. Eric [name] agreed to discontinue Wellbutrin, and will continue to monitor. Orders and follow up as documented in patient record.   3. Obesity with current BMI 35.4 Eric [name] is currently in the action stage of change. As such, his goal  is to continue with weight loss efforts. He has agreed to the Category 3 Plan.   Exercise goals: As is.  Behavioral modification strategies: increasing lean protein intake and meal planning and cooking strategies.  Eric [name] has agreed to follow-up with our clinic in 4 weeks. He was informed of the importance of frequent follow-up visits to maximize his success with intensive lifestyle modifications for his multiple health conditions.   Objective:   Blood pressure 112/63, pulse (!) 52, temperature 97.8 F (36.6 C), height 5\' 8"  (1.727 m), weight 232 lb (105.2 kg), SpO2 97 %. Body mass index is 35.28 kg/m.  General: Cooperative, alert, well developed, in no acute distress. HEENT: Conjunctivae and lids unremarkable. Cardiovascular: Regular rhythm.  Lungs: Normal work of breathing. Neurologic: No focal deficits.   Lab Results  Component Value Date   CREATININE 1.00 03/03/2020   BUN 18 03/03/2020   NA 139 03/03/2020   K 4.3 03/03/2020   CL 102 03/03/2020   CO2 31 03/03/2020   Lab Results  Component Value Date   ALT 16 03/03/2020   AST 13 03/03/2020   ALKPHOS 56 03/03/2020   BILITOT 0.8 03/03/2020   Lab Results  Component Value Date   HGBA1C 5.4 03/03/2020   HGBA1C 5.6 10/22/2019   HGBA1C 5.3 06/18/2019   HGBA1C 5.5 03/02/2019   HGBA1C 5.6 08/29/2018   Lab Results  Component Value Date   INSULIN 9.0 10/22/2019   INSULIN 21.3 12/14/2017   Lab Results  Component Value Date   TSH 1.060 12/14/2017   Lab Results  Component  Value Date   CHOL 131 03/03/2020   HDL 46.10 03/03/2020   LDLCALC 70 03/03/2020   TRIG 78.0 03/03/2020   CHOLHDL 3 03/03/2020   Lab Results  Component Value Date   WBC 4.9 03/03/2020   HGB 15.5 03/03/2020   HCT 46.7 03/03/2020   MCV 92.9 03/03/2020   PLT 178.0 03/03/2020   No results found for: IRON, TIBC, FERRITIN  Obesity Behavioral Intervention:   Approximately 15 minutes were spent on the discussion below.  ASK: We discussed the  diagnosis of obesity with Eric [name] today and Eric [name] agreed to give [place] permission to discuss obesity behavioral modification therapy today.  ASSESS: Eric [name] has the diagnosis of obesity and his BMI today is 35.28. Eric [name] is in the action stage of change.   ADVISE: Eric [name] was educated on the multiple health risks of obesity as well as the benefit of weight loss to improve his health. He was advised of the need for long term treatment and the importance of lifestyle modifications to improve his current health and to decrease his risk of future health problems.  AGREE: Multiple dietary modification options and treatment options were discussed and Eric [name] agreed to follow the recommendations documented in the above note.  ARRANGE: Eric [name] was educated on the importance of frequent visits to treat obesity as outlined per CMS and USPSTF guidelines and agreed to schedule his next follow up appointment today.  Attestation Statements:   Reviewed by clinician on day of visit: allergies, medications, problem list, medical history, surgical history, family history, social history, and previous encounter notes.   I, Trixie Dredge, am acting as transcriptionist for [name], MD.  I have reviewed the above documentation for accuracy and completeness, and I agree with the above. -  [name], MD

## 2020-08-06 DIAGNOSIS — M47816 Spondylosis without myelopathy or radiculopathy, lumbar region: Secondary | ICD-10-CM | POA: Diagnosis not present

## 2020-08-23 ENCOUNTER — Other Ambulatory Visit (INDEPENDENT_AMBULATORY_CARE_PROVIDER_SITE_OTHER): Payer: Self-pay | Admitting: Family Medicine

## 2020-08-23 DIAGNOSIS — R7303 Prediabetes: Secondary | ICD-10-CM

## 2020-08-23 DIAGNOSIS — F3289 Other specified depressive episodes: Secondary | ICD-10-CM

## 2020-08-25 DIAGNOSIS — M47816 Spondylosis without myelopathy or radiculopathy, lumbar region: Secondary | ICD-10-CM | POA: Diagnosis not present

## 2020-08-25 NOTE — Telephone Encounter (Signed)
Pt last seen by Dr. Beasley.  

## 2020-08-25 NOTE — Telephone Encounter (Signed)
Patient is requesting a refill of the following medications: Requested Prescriptions   Pending Prescriptions Disp Refills   metFORMIN (GLUCOPHAGE) 500 MG tablet [Pharmacy Med Name: METFORMIN HCL 500 MG TAB] 30 tablet 0    Sig: TAKE 1 TABLET BY MOUTH ONCE DAILY WITH BREAKFAST   buPROPion (WELLBUTRIN SR) 200 MG 12 hr tablet [Pharmacy Med Name: BUPROPION HCL ER (SR) 200 MG TAB] 30 tablet 0    Sig: TAKE 1 TABLET BY MOUTH ONCE DAILY    Last office visit: 08/04/20 Date of last refill: metformin-07/30/20, bupropion-07/07/20 Last refill amount: 30 and 30 Follow up time period per chart: 4 week Next appt scheduled: 09/01/20

## 2020-09-01 ENCOUNTER — Ambulatory Visit (INDEPENDENT_AMBULATORY_CARE_PROVIDER_SITE_OTHER): Payer: Medicare Other | Admitting: Family Medicine

## 2020-09-01 ENCOUNTER — Other Ambulatory Visit: Payer: Self-pay

## 2020-09-01 ENCOUNTER — Encounter: Payer: Self-pay | Admitting: Family Medicine

## 2020-09-01 DIAGNOSIS — E1169 Type 2 diabetes mellitus with other specified complication: Secondary | ICD-10-CM

## 2020-09-01 DIAGNOSIS — R350 Frequency of micturition: Secondary | ICD-10-CM | POA: Diagnosis not present

## 2020-09-01 DIAGNOSIS — E1165 Type 2 diabetes mellitus with hyperglycemia: Secondary | ICD-10-CM | POA: Diagnosis not present

## 2020-09-01 DIAGNOSIS — E559 Vitamin D deficiency, unspecified: Secondary | ICD-10-CM

## 2020-09-01 DIAGNOSIS — M545 Low back pain, unspecified: Secondary | ICD-10-CM

## 2020-09-01 DIAGNOSIS — E785 Hyperlipidemia, unspecified: Secondary | ICD-10-CM | POA: Diagnosis not present

## 2020-09-01 DIAGNOSIS — G8929 Other chronic pain: Secondary | ICD-10-CM | POA: Diagnosis not present

## 2020-09-01 DIAGNOSIS — I10 Essential (primary) hypertension: Secondary | ICD-10-CM | POA: Diagnosis not present

## 2020-09-01 LAB — CBC WITH DIFFERENTIAL/PLATELET
Basophils Absolute: 0 10*3/uL (ref 0.0–0.1)
Basophils Relative: 0.6 % (ref 0.0–3.0)
Eosinophils Absolute: 0.1 10*3/uL (ref 0.0–0.7)
Eosinophils Relative: 1.8 % (ref 0.0–5.0)
HCT: 44.4 % (ref 39.0–52.0)
Hemoglobin: 14.9 g/dL (ref 13.0–17.0)
Lymphocytes Relative: 28.5 % (ref 12.0–46.0)
Lymphs Abs: 1.4 10*3/uL (ref 0.7–4.0)
MCHC: 33.6 g/dL (ref 30.0–36.0)
MCV: 92.7 fl (ref 78.0–100.0)
Monocytes Absolute: 0.6 10*3/uL (ref 0.1–1.0)
Monocytes Relative: 11.2 % (ref 3.0–12.0)
Neutro Abs: 2.9 10*3/uL (ref 1.4–7.7)
Neutrophils Relative %: 57.9 % (ref 43.0–77.0)
Platelets: 178 10*3/uL (ref 150.0–400.0)
RBC: 4.79 Mil/uL (ref 4.22–5.81)
RDW: 14 % (ref 11.5–15.5)
WBC: 5.1 10*3/uL (ref 4.0–10.5)

## 2020-09-01 LAB — COMPREHENSIVE METABOLIC PANEL
ALT: 12 U/L (ref 0–53)
AST: 10 U/L (ref 0–37)
Albumin: 4.1 g/dL (ref 3.5–5.2)
Alkaline Phosphatase: 53 U/L (ref 39–117)
BUN: 21 mg/dL (ref 6–23)
CO2: 28 mEq/L (ref 19–32)
Calcium: 8.9 mg/dL (ref 8.4–10.5)
Chloride: 106 mEq/L (ref 96–112)
Creatinine, Ser: 0.95 mg/dL (ref 0.40–1.50)
GFR: 78.94 mL/min (ref >60.00)
Glucose, Bld: 94 mg/dL (ref 70–99)
Potassium: 4.4 mEq/L (ref 3.5–5.1)
Sodium: 140 mEq/L (ref 135–145)
Total Bilirubin: 0.6 mg/dL (ref 0.2–1.2)
Total Protein: 6.1 g/dL (ref 6.0–8.3)

## 2020-09-01 LAB — VITAMIN D 25 HYDROXY (VIT D DEFICIENCY, FRACTURES): VITD: 44.5 ng/mL (ref 30.00–100.00)

## 2020-09-01 LAB — MICROALBUMIN / CREATININE URINE RATIO
Creatinine,U: 38.4 mg/dL
Microalb Creat Ratio: 1.8 mg/g (ref 0.0–30.0)
Microalb, Ur: <0.7 mg/dL (ref 0.0–1.9)

## 2020-09-01 LAB — HEMOGLOBIN A1C: Hgb A1c MFr Bld: 5.6 % (ref 4.6–6.5)

## 2020-09-01 LAB — LIPID PANEL
Cholesterol: 122 mg/dL (ref 0–200)
HDL: 50.3 mg/dL (ref >39.00)
LDL Cholesterol: 64 mg/dL (ref 0–99)
NonHDL: 71.58
Total CHOL/HDL Ratio: 2
Triglycerides: 38 mg/dL (ref 0.0–149.0)
VLDL: 7.6 mg/dL (ref 0.0–40.0)

## 2020-09-01 NOTE — Assessment & Plan Note (Signed)
Well controlled, no changes to meds. Encouraged heart healthy diet such as the DASH diet and exercise as tolerated.  °

## 2020-09-01 NOTE — Patient Instructions (Signed)

## 2020-09-01 NOTE — Progress Notes (Signed)
Patient ID: Eric [name], male    DOB: [birth year]  Age: 75 y.o. MRN: [id number]    Subjective:  Subjective  HPI Eric [name] presents for an office visit today. He complains of lower back pain. He notes attending PT regularly for his back pain. He reports receiving dry needling, however it didn't not relieved his symptoms. He notes that it worsen when he wakes up in the morning and with physical activity. He states that it improved with rest. He endorses taking 200 mg of tylenol PO Daily and 15 mg of Meloxicam PO Daily for his lower back pain.   He also complains of frequency and incontinent. He notes that he urinated 10 times per day. He reports drinking tea and decaf coffee.  He notes that he has been dx with bilateral hand tremors.   He endorse having bilateral LE edema, however the symptoms has relieved.  He endorses taking 80 mg of Valsartan PO Daily for his dx of HTN. He notes not suffering form any side-affect from his HTN medication.  BP Readings from Last 3 Encounters:  09/01/20 108/88  08/04/20 112/63  07/07/20 137/77  He notes taking 40 mg of Atorvastatin PO Daily for his dx of HLD.  Lab Results  Component Value Date   CHOL 131 03/03/2020   CHOL 137 10/22/2019   CHOL 136 03/02/2019   Lab Results  Component Value Date   HDL 46.10 03/03/2020   HDL 50 10/22/2019   HDL 44.30 03/02/2019   Lab Results  Component Value Date   LDLCALC 70 03/03/2020   LDLCALC 73 10/22/2019   LDLCALC 78 03/02/2019   Lab Results  Component Value Date   TRIG 78.0 03/03/2020   TRIG 66 10/22/2019   TRIG 68.0 03/02/2019   Lab Results  Component Value Date   CHOLHDL 3 03/03/2020   CHOLHDL 3 03/02/2019   CHOLHDL 3 08/29/2018   No results found for: LDLDIRECT  He denies any chest pain, SOB, fever, abdominal pain, cough, chills, sore throat, dysuria, HA, or N/V/D at this time. He endorse going to the neurologist, dentist, nephrologist, and PT.  Review of Systems  Constitutional:   Negative for chills, fatigue and fever.  HENT:  Negative for ear pain, rhinorrhea, sinus pressure, sinus pain, sore throat and tinnitus.   Eyes:  Negative for pain.  Respiratory:  Negative for cough, shortness of breath and wheezing.   Cardiovascular:  Positive for leg swelling (Bilateral). Negative for chest pain.  Gastrointestinal:  Negative for abdominal pain, anal bleeding, constipation, diarrhea, nausea and vomiting.  Genitourinary:  Positive for frequency. Negative for flank pain.       (+) incontinent  Musculoskeletal:  Positive for back pain (lower). Negative for neck pain.  Skin:  Negative for rash.  Neurological:  Positive for tremors. Negative for seizures, weakness, light-headedness, numbness and headaches.   History Past Medical History:  Diagnosis Date   Alcohol abuse    Allergy    Back pain    Cataract    forming   Colon polyps    Diverticulosis of colon    DJD (degenerative joint disease)    Dysrhythmia    Palpations from caffiene- reports he was having PACs due to slim fast wth caffeine - had full cardiac w/u and was told to cut back on caffeine;  now drink decaf bevrerages and no rpeort of recurrence    Enlarged prostate    GERD (gastroesophageal reflux disease)    Hearing loss  Hip osteoarthritis    Left   History of hay fever    Hx of cardiovascular stress test    Lexiscan Myoview (04/2013):  No ischemia, EF 61%; low risk   Hyperlipemia    Hypertension    Insulin resistance    om metfromin    Internal hemorrhoids without mention of complication    Leg edema    Neuroma of foot    left   Obesity    Osteoarthrosis, unspecified whether generalized or localized, unspecified site    Palpitations    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   Primary localized osteoarthrosis of right shoulder 05/25/2016   Recent weight loss    75lbs   Seasonal allergies    Sleep apnea    CPAP   Thyroid nodule    calcified thyroid nodules- bx negative     Tremor of both hands     He has a past surgical history that includes Shoulder surgery (2009); Tonsillectomy; Colonoscopy; Total shoulder arthroplasty (Left, 10/14/2015); Total shoulder arthroplasty (Right, 05/25/2016); Total hip arthroplasty (Left, 03/28/2017); Polypectomy; and Total hip arthroplasty (Right, 09/25/2018).   His family history includes Anxiety disorder in his mother; Arthritis in his mother; Breast cancer in his mother, sister, and another family member; Cancer (age of onset: 68) in his sister; Cancer (age of onset: 100) in his mother; Cancer (age of onset: 63) in his brother; Colon polyps (age of onset: 26) in his mother; Depression in his father and mother; Diabetes in his father; Heart disease in his father and mother; Hyperlipidemia in his father and mother; Hypertension in his father and mother; Obesity in his father and mother; Prostate cancer in his brother and another family member; Sleep apnea in his father.He reports that he has never smoked. He has never used smokeless tobacco. He reports current alcohol use. He reports that he does not use drugs.  Current Outpatient Medications on File Prior to Visit  Medication Sig Dispense Refill   aspirin EC 81 MG tablet Take 1 tablet (81 mg total) by mouth 2 (two) times daily. (Patient taking differently: Take 81 mg by mouth once.) 60 tablet 0   atorvastatin (LIPITOR) 40 MG tablet TAKE 1 TABLET DAILY 90 tablet 3   buPROPion (WELLBUTRIN SR) 200 MG 12 hr tablet TAKE 1 TABLET BY MOUTH ONCE DAILY 30 tablet 0   carbamide peroxide (DEBROX) 6.5 % OTIC solution Place 5 drops into both ears daily as needed (for ear wax).     clindamycin (CLEOCIN-T) 1 % lotion Apply topically 2 (two) times daily. 60 mL 2   Coenzyme Q10 (COQ10) 200 MG CAPS Take 200 mg by mouth at bedtime.     fluocinonide ointment (LIDEX) 7.82 % Apply 1 application topically daily as needed (for bites). 30 g 3   fluticasone (FLONASE) 50 MCG/ACT nasal spray Place 2 sprays into both  nostrils daily as needed for allergies.      loratadine (CLARITIN) 10 MG tablet Take 10 mg by mouth daily as needed for allergies.      meloxicam (MOBIC) 15 MG tablet Take 1 tablet (15 mg total) by mouth daily. 90 tablet 1   metFORMIN (GLUCOPHAGE) 500 MG tablet TAKE 1 TABLET BY MOUTH ONCE DAILY WITH BREAKFAST 30 tablet 0   pantoprazole (PROTONIX) 40 MG tablet TAKE 1 TABLET DAILY 90 tablet 3   tamsulosin (FLOMAX) 0.4 MG CAPS capsule TAKE 1 CAPSULE DAILY 90 capsule 3   valsartan (DIOVAN) 80 MG tablet Take 1 tablet (80 mg total)  by mouth daily. 90 tablet 1   Vitamin D, Ergocalciferol, (DRISDOL) 1.25 MG (50000 UNIT) CAPS capsule Take 1 capsule (50,000 Units total) by mouth every 7 (seven) days. 4 capsule 0   Zinc 50 MG CAPS Take by mouth daily.     No current facility-administered medications on file prior to visit.     Objective:  Objective  Physical Exam Vitals and nursing note reviewed.  Constitutional:      General: He is not in acute distress.    Appearance: Normal appearance. He is well-developed. He is not ill-appearing.  HENT:     Head: Normocephalic and atraumatic.     Right Ear: External ear normal.     Left Ear: External ear normal.     Nose: Nose normal.  Eyes:     General:        Right eye: No discharge.        Left eye: No discharge.     Extraocular Movements: Extraocular movements intact.     Pupils: Pupils are equal, round, and reactive to light.  Cardiovascular:     Rate and Rhythm: Normal rate and regular rhythm.     Pulses: Normal pulses.     Heart sounds: Normal heart sounds. No murmur heard.   No friction rub. No gallop.  Pulmonary:     Effort: Pulmonary effort is normal. No respiratory distress.     Breath sounds: Normal breath sounds. No stridor. No wheezing, rhonchi or rales.  Chest:     Chest wall: No tenderness.  Abdominal:     General: Bowel sounds are normal. There is no distension.     Palpations: Abdomen is soft. There is no mass.     Tenderness:  There is no abdominal tenderness. There is no guarding or rebound.     Hernia: No hernia is present.  Musculoskeletal:        General: Normal range of motion.     Cervical back: Normal range of motion and neck supple.     Right lower leg: No edema.     Left lower leg: No edema.  Skin:    General: Skin is warm and dry.  Neurological:     Mental Status: He is alert and oriented to person, place, and time.  Psychiatric:        Behavior: Behavior normal.        Thought Content: Thought content normal.   BP 108/88 (BP Location: Right Arm, Patient Position: Sitting, Cuff Size: Normal)   Pulse (!) 48   Temp 98.3 F (36.8 C) (Oral)   Resp 18   Ht 5\' 8"  (1.727 m)   Wt 248 lb 3.2 oz (112.6 kg)   SpO2 98%   BMI 37.74 kg/m  Wt Readings from Last 3 Encounters:  09/01/20 248 lb 3.2 oz (112.6 kg)  08/04/20 232 lb (105.2 kg)  07/07/20 233 lb (105.7 kg)     Lab Results  Component Value Date   WBC 4.9 03/03/2020   HGB 15.5 03/03/2020   HCT 46.7 03/03/2020   PLT 178.0 03/03/2020   GLUCOSE 95 03/03/2020   CHOL 131 03/03/2020   TRIG 78.0 03/03/2020   HDL 46.10 03/03/2020   LDLCALC 70 03/03/2020   ALT 16 03/03/2020   AST 13 03/03/2020   NA 139 03/03/2020   K 4.3 03/03/2020   CL 102 03/03/2020   CREATININE 1.00 03/03/2020   BUN 18 03/03/2020   CO2 31 03/03/2020   TSH 1.060 12/14/2017  PSA 1.00 03/03/2020   INR 1.0 09/21/2018   HGBA1C 5.4 03/03/2020   MICROALBUR 1.5 12/25/2012    MR Lumbar Spine Wo Contrast  Result Date: 03/29/2020 CLINICAL DATA:  Chronic low back pain EXAM: MRI LUMBAR SPINE WITHOUT CONTRAST TECHNIQUE: Multiplanar, multisequence MR imaging of the lumbar spine was performed. No intravenous contrast was administered. COMPARISON:  05/22/2013 FINDINGS: Segmentation: 5 lumbar type vertebral bodies as numbered previously. Alignment: Minimal curvature convex to the right. 2 mm degenerative anterolisthesis L3-4. 6 mm anterolisthesis at L5-S1. Difficult to be certain if  this is due to chronic facet arthropathy or chronic pars defects. Vertebrae:  No vertebral body fracture or focal bone lesion. Conus medullaris and cauda equina: Conus extends to the L1 level. Conus and cauda equina appear normal. Paraspinal and other soft tissues: Negative. Chronic retroperitoneal cyst on the left at the L2 level, unchanged since 2015, benign and not significant. Disc levels: Mild, non-compressive disc bulges at L2-3 and above. Mild facet prominence. No compressive stenosis. L3-4: Bilateral facet degeneration and hypertrophy allowing 2 mm of anterolisthesis. Bulging of the disc. Moderate multifactorial stenosis that could be a cause of back pain. The appearance could worsen with standing or flexion. The appearance has worsened since 2015. L4-5: Endplate osteophytes and bulging of the disc. Mild facet and ligamentous prominence. Mild stenosis of the lateral recesses but without definite neural compression. L5-S1: Chronic anterolisthesis of 6 mm. Question the presence pars defects or chronic facet arthropathy. Disc degeneration with bulging of the disc. No canal stenosis. Mild bilateral foraminal stenosis but without definite compression of the exiting L5 nerves. Findings at this level could certainly relate to back pain. IMPRESSION: 1. L3-4: Moderate multifactorial spinal stenosis that could be a cause of neural compression, back pain or referred facet syndrome pain. The appearance has worsened since 2015. 2. L4-5: Mild lateral recess narrowing but without definite neural compression. 3. L5-S1: Chronic anterolisthesis of 6 mm. Question the presence of pars defects and or chronic facet arthropathy. Bulging of the disc. Mild bilateral foraminal stenosis but without definite compression of the exiting L5 nerves. Findings at this level could relate to back pain. Similar appearance to the study of 2015. Electronically Signed   By: Nelson Chimes M.D.   On: 03/29/2020 15:54     Assessment & Plan:  Plan    No orders of the defined types were placed in this encounter.   Problem List Items Addressed This Visit       Unprioritized   Vitamin D deficiency   Relevant Orders   VITAMIN D 25 Hydroxy (Vit-D Deficiency, Fractures)   Essential hypertension    Well controlled, no changes to meds. Encouraged heart healthy diet such as the DASH diet and exercise as tolerated.        Hyperlipidemia LDL goal <100    Encourage heart healthy diet such as MIND or DASH diet, increase exercise, avoid trans fats, simple carbohydrates and processed foods, consider a krill or fish or flaxseed oil cap daily.        Low back pain    Followed by neuro surg/ PT and sport meds       Morbid obesity ([place]) - Primary    con't HWW       Urinary frequency    Followed by urology       Other Visit Diagnoses     Uncontrolled type 2 diabetes mellitus with hyperglycemia ([place])       Relevant Orders   CBC with Differential/Platelet  Comprehensive metabolic panel   Hemoglobin A1c   Microalbumin / creatinine urine ratio   Insulin, random   Hyperlipidemia associated with type 2 diabetes mellitus ([place])       Relevant Orders   Lipid panel   Comprehensive metabolic panel   Primary hypertension           Follow-up: Return in about 6 months (around 03/03/2021), or if symptoms worsen or fail to improve, for hypertension, hyperlipidemia.   I,Gordon Zheng,acting as a Education administrator for [employer], DO.,have documented all relevant documentation on the behalf of [name], DO,as directed by  [name], DO while in the presence of Gordon, DO, have reviewed all documentation for this visit. The documentation on 09/01/20 for the exam, diagnosis, procedures, and orders are all accurate and complete.

## 2020-09-01 NOTE — Assessment & Plan Note (Addendum)
Followed by neuro surg/ PT and sport meds

## 2020-09-01 NOTE — Assessment & Plan Note (Signed)
Followed by urology.   

## 2020-09-01 NOTE — Assessment & Plan Note (Signed)
Encourage heart healthy diet such as MIND or DASH diet, increase exercise, avoid trans fats, simple carbohydrates and processed foods, consider a krill or fish or flaxseed oil cap daily.  °

## 2020-09-01 NOTE — Assessment & Plan Note (Signed)
con't HWW 

## 2020-09-02 LAB — INSULIN, RANDOM: Insulin: 17.1 u[IU]/mL

## 2020-09-10 DIAGNOSIS — H2513 Age-related nuclear cataract, bilateral: Secondary | ICD-10-CM | POA: Diagnosis not present

## 2020-09-10 DIAGNOSIS — Z7984 Long term (current) use of oral hypoglycemic drugs: Secondary | ICD-10-CM | POA: Diagnosis not present

## 2020-09-10 DIAGNOSIS — H52203 Unspecified astigmatism, bilateral: Secondary | ICD-10-CM | POA: Diagnosis not present

## 2020-09-10 DIAGNOSIS — E119 Type 2 diabetes mellitus without complications: Secondary | ICD-10-CM | POA: Diagnosis not present

## 2020-09-10 DIAGNOSIS — H524 Presbyopia: Secondary | ICD-10-CM | POA: Diagnosis not present

## 2020-09-10 DIAGNOSIS — H5203 Hypermetropia, bilateral: Secondary | ICD-10-CM | POA: Diagnosis not present

## 2020-09-16 ENCOUNTER — Other Ambulatory Visit (INDEPENDENT_AMBULATORY_CARE_PROVIDER_SITE_OTHER): Payer: Self-pay | Admitting: Family Medicine

## 2020-09-16 DIAGNOSIS — E559 Vitamin D deficiency, unspecified: Secondary | ICD-10-CM

## 2020-09-17 NOTE — Telephone Encounter (Signed)
Pt last seen by Dr. Beasley.  

## 2020-09-17 NOTE — Telephone Encounter (Signed)
Patient is requesting a refill of the following medications: Requested Prescriptions   Pending Prescriptions Disp Refills   Vitamin D, Ergocalciferol, (DRISDOL) 1.25 MG (50000 UNIT) CAPS capsule [Pharmacy Med Name: VITAMIN D (ERGOCALCIFEROL) 1.25 MG] 4 capsule 0    Sig: TAKE ONE CAPSULE BY MOUTH EVERY 7 DAYS    Last office visit: 08/04/20 Date of last refill: 08/04/20 Last refill amount: 4 Follow up time period per chart: 4 week Next appt:10/02/20

## 2020-09-18 ENCOUNTER — Other Ambulatory Visit: Payer: Self-pay | Admitting: Family Medicine

## 2020-09-18 DIAGNOSIS — K219 Gastro-esophageal reflux disease without esophagitis: Secondary | ICD-10-CM

## 2020-09-19 ENCOUNTER — Other Ambulatory Visit (INDEPENDENT_AMBULATORY_CARE_PROVIDER_SITE_OTHER): Payer: Self-pay | Admitting: Family Medicine

## 2020-09-19 DIAGNOSIS — R7303 Prediabetes: Secondary | ICD-10-CM

## 2020-09-19 DIAGNOSIS — F3289 Other specified depressive episodes: Secondary | ICD-10-CM

## 2020-09-23 ENCOUNTER — Encounter (INDEPENDENT_AMBULATORY_CARE_PROVIDER_SITE_OTHER): Payer: Self-pay

## 2020-09-23 NOTE — Telephone Encounter (Signed)
Message sent to pt-CAS 

## 2020-09-23 NOTE — Telephone Encounter (Signed)
Pt last seen by Dr. Beasley.  

## 2020-09-25 ENCOUNTER — Ambulatory Visit (INDEPENDENT_AMBULATORY_CARE_PROVIDER_SITE_OTHER): Payer: Medicare Other | Admitting: Family Medicine

## 2020-10-02 ENCOUNTER — Other Ambulatory Visit: Payer: Self-pay

## 2020-10-02 ENCOUNTER — Ambulatory Visit (INDEPENDENT_AMBULATORY_CARE_PROVIDER_SITE_OTHER): Payer: Medicare Other | Admitting: Family Medicine

## 2020-10-02 ENCOUNTER — Encounter (INDEPENDENT_AMBULATORY_CARE_PROVIDER_SITE_OTHER): Payer: Self-pay | Admitting: Family Medicine

## 2020-10-02 VITALS — BP 111/77 | HR 88 | Temp 98.2°F | Ht 68.0 in | Wt 231.0 lb

## 2020-10-02 DIAGNOSIS — F3289 Other specified depressive episodes: Secondary | ICD-10-CM | POA: Diagnosis not present

## 2020-10-02 DIAGNOSIS — Z6841 Body Mass Index (BMI) 40.0 and over, adult: Secondary | ICD-10-CM

## 2020-10-02 DIAGNOSIS — E559 Vitamin D deficiency, unspecified: Secondary | ICD-10-CM

## 2020-10-02 DIAGNOSIS — R7303 Prediabetes: Secondary | ICD-10-CM

## 2020-10-02 MED ORDER — VITAMIN D (ERGOCALCIFEROL) 1.25 MG (50000 UNIT) PO CAPS: ORAL_CAPSULE | ORAL | 1 refills | Status: DC

## 2020-10-02 MED ORDER — BUPROPION HCL ER (SR) 200 MG PO TB12: 200.0000 mg | ORAL_TABLET | Freq: Every day | ORAL | 0 refills | Status: DC

## 2020-10-02 MED ORDER — METFORMIN HCL 500 MG PO TABS: 500.0000 mg | ORAL_TABLET | Freq: Every day | ORAL | 1 refills | Status: DC

## 2020-10-02 MED ORDER — BUPROPION HCL ER (SR) 200 MG PO TB12: 200.0000 mg | ORAL_TABLET | Freq: Every day | ORAL | 1 refills | Status: DC

## 2020-10-02 MED ORDER — VITAMIN D (ERGOCALCIFEROL) 1.25 MG (50000 UNIT) PO CAPS: ORAL_CAPSULE | ORAL | 0 refills | Status: DC

## 2020-10-02 MED ORDER — METFORMIN HCL 500 MG PO TABS: 500.0000 mg | ORAL_TABLET | Freq: Every day | ORAL | 0 refills | Status: DC

## 2020-10-13 NOTE — Progress Notes (Signed)
Chief Complaint:   OBESITY Eric [name] is here to discuss his progress with his obesity treatment plan along with follow-up of his obesity related diagnoses. Eric [name] is on the Category 3 Plan and states he is following his eating plan approximately 30% of the time. Eric [name] states he is riding the stationary bike for 25 minutes 2 times per week.  Today's visit was #: 22 Starting weight: 288 lbs Starting date: 12/14/2017 Today's weight: 231 lbs Today's date: 10/02/2020 Total lbs lost to date: 57 Total lbs lost since last in-office visit: 1  Interim History: Eric [name] continues to do well with weight loss. He has had some extra challenges but he is making good choices overall, and he has done well avoiding weight gain.  Subjective:   1. Pre-diabetes Eric [name] is stable on metformin, and he is doing well with his diet. I discussed labs with the patient today.  2. Vitamin D deficiency Eric [name] is stable on Vit D, and his level is almost at goal. I discussed labs with the patient today.  3. Other depression with emotional eating Eric [name] restarted Wellbutrin as he notes some increased cravings and increased snacking.  Assessment/Plan:   1. Pre-diabetes Eric [name] will continue to work on weight loss, exercise, and decreasing simple carbohydrates to help decrease the risk of diabetes. We will refill metformin for 2 months.  - metFORMIN (GLUCOPHAGE) 500 MG tablet; Take 1 tablet (500 mg total) by mouth daily with breakfast.  Dispense: 30 tablet; Refill: 1  2. Vitamin D deficiency Low Vitamin D level contributes to fatigue and are associated with obesity, breast, and colon cancer. We will refill prescription Vitamin D for 2 month. Eric [name] will follow-up for routine testing of Vitamin D, at least 2-3 times per year to avoid over-replacement.  - Vitamin D, Ergocalciferol, (DRISDOL) 1.25 MG (50000 UNIT) CAPS capsule; TAKE ONE CAPSULE BY MOUTH EVERY 7 DAYS  Dispense: 4 capsule; Refill: 1  3. Other  depression with emotional eating Behavior modification techniques were discussed today to help Eric [name] deal with his emotional/non-hunger eating behaviors. We will refill Wellbutrin SR for 2 months. Orders and follow up as documented in patient record.   - buPROPion (WELLBUTRIN SR) 200 MG 12 hr tablet; Take 1 tablet (200 mg total) by mouth daily.  Dispense: 30 tablet; Refill: 1  4. Obesity with current BMI 35.2 Eric [name] is currently in the action stage of change. As such, his goal is to continue with weight loss efforts. He has agreed to the Category 3 Plan.   Exercise goals: As is.  Behavioral modification strategies: increasing lean protein intake and meal planning and cooking strategies.  Eric [name] has agreed to follow-up with our clinic in 4 weeks. He was informed of the importance of frequent follow-up visits to maximize his success with intensive lifestyle modifications for his multiple health conditions.   Objective:   Blood pressure 111/77, pulse 88, temperature 98.2 F (36.8 C), height '5\' 8"'$  (1.727 m), weight 231 lb (104.8 kg), SpO2 98 %. Body mass index is 35.12 kg/m.  General: Cooperative, alert, well developed, in no acute distress. HEENT: Conjunctivae and lids unremarkable. Cardiovascular: Regular rhythm.  Lungs: Normal work of breathing. Neurologic: No focal deficits.   Lab Results  Component Value Date   CREATININE 0.95 09/01/2020   BUN 21 09/01/2020   NA 140 09/01/2020   K 4.4 09/01/2020   CL 106 09/01/2020   CO2 28 09/01/2020   Lab Results  Component Value Date   ALT 12 09/01/2020  AST 10 09/01/2020   ALKPHOS 53 09/01/2020   BILITOT 0.6 09/01/2020   Lab Results  Component Value Date   HGBA1C 5.6 09/01/2020   HGBA1C 5.4 03/03/2020   HGBA1C 5.6 10/22/2019   HGBA1C 5.3 06/18/2019   HGBA1C 5.5 03/02/2019   Lab Results  Component Value Date   INSULIN 9.0 10/22/2019   INSULIN 21.3 12/14/2017   Lab Results  Component Value Date   TSH 1.060 12/14/2017    Lab Results  Component Value Date   CHOL 122 09/01/2020   HDL 50.30 09/01/2020   LDLCALC 64 09/01/2020   TRIG 38.0 09/01/2020   CHOLHDL 2 09/01/2020   Lab Results  Component Value Date   VD25OH 44.50 09/01/2020   VD25OH 49.51 03/03/2020   VD25OH 55.6 10/22/2019   Lab Results  Component Value Date   WBC 5.1 09/01/2020   HGB 14.9 09/01/2020   HCT 44.4 09/01/2020   MCV 92.7 09/01/2020   PLT 178.0 09/01/2020   No results found for: IRON, TIBC, FERRITIN  Obesity Behavioral Intervention:   Approximately 15 minutes were spent on the discussion below.  ASK: We discussed the diagnosis of obesity with Eric [name] today and Eric [name] agreed to give [place] permission to discuss obesity behavioral modification therapy today.  ASSESS: Eric [name] has the diagnosis of obesity and his BMI today is 35.13. Eric [name] is in the action stage of change.   ADVISE: Eric [name] was educated on the multiple health risks of obesity as well as the benefit of weight loss to improve his health. He was advised of the need for long term treatment and the importance of lifestyle modifications to improve his current health and to decrease his risk of future health problems.  AGREE: Multiple dietary modification options and treatment options were discussed and Eric [name] agreed to follow the recommendations documented in the above note.  ARRANGE: Eric [name] was educated on the importance of frequent visits to treat obesity as outlined per CMS and USPSTF guidelines and agreed to schedule his next follow up appointment today.  Attestation Statements:   Reviewed by clinician on day of visit: allergies, medications, problem list, medical history, surgical history, family history, social history, and previous encounter notes.   I, Trixie Dredge, am acting as transcriptionist for [name], MD.  I have reviewed the above documentation for accuracy and completeness, and I agree with the above. -   [name], MD

## 2020-11-10 ENCOUNTER — Encounter: Payer: Self-pay | Admitting: Family Medicine

## 2020-11-13 ENCOUNTER — Ambulatory Visit (INDEPENDENT_AMBULATORY_CARE_PROVIDER_SITE_OTHER): Payer: Medicare Other | Admitting: Family Medicine

## 2020-11-20 ENCOUNTER — Ambulatory Visit (INDEPENDENT_AMBULATORY_CARE_PROVIDER_SITE_OTHER): Payer: Medicare Other | Admitting: Family Medicine

## 2020-11-20 ENCOUNTER — Other Ambulatory Visit: Payer: Self-pay

## 2020-11-20 ENCOUNTER — Encounter (INDEPENDENT_AMBULATORY_CARE_PROVIDER_SITE_OTHER): Payer: Self-pay | Admitting: Family Medicine

## 2020-11-20 VITALS — BP 115/75 | HR 59 | Temp 97.9°F | Ht 68.0 in | Wt 232.0 lb

## 2020-11-20 DIAGNOSIS — E559 Vitamin D deficiency, unspecified: Secondary | ICD-10-CM | POA: Diagnosis not present

## 2020-11-20 DIAGNOSIS — Z6841 Body Mass Index (BMI) 40.0 and over, adult: Secondary | ICD-10-CM

## 2020-11-20 DIAGNOSIS — F3289 Other specified depressive episodes: Secondary | ICD-10-CM

## 2020-11-20 DIAGNOSIS — R7303 Prediabetes: Secondary | ICD-10-CM | POA: Diagnosis not present

## 2020-11-20 MED ORDER — BUPROPION HCL ER (SR) 200 MG PO TB12: 200.0000 mg | ORAL_TABLET | Freq: Every day | ORAL | 1 refills | Status: DC

## 2020-11-20 MED ORDER — VITAMIN D (ERGOCALCIFEROL) 1.25 MG (50000 UNIT) PO CAPS: ORAL_CAPSULE | ORAL | 1 refills | Status: DC

## 2020-11-20 MED ORDER — METFORMIN HCL 500 MG PO TABS: 500.0000 mg | ORAL_TABLET | Freq: Every day | ORAL | 1 refills | Status: DC

## 2020-11-20 NOTE — Progress Notes (Signed)
Chief Complaint:   OBESITY Eric [name] is here to discuss his progress with his obesity treatment plan along with follow-up of his obesity related diagnoses. Eric [name] is on the Category 3 Plan and states he is following his eating plan approximately 66% of the time. Eric [name] states he is doing yard work 3 times per week.   Today's visit was #: 57 Starting weight: 288 lbs Starting date: 12/14/2017 Today's weight: 232 lbs Today's date: 11/20/2020 Total lbs lost to date: 25 Total lbs lost since last in-office visit: 0  Interim History: Eric [name] has had COVID recently and he hasn't been able to stay on track. He has done more traveling recently but he is ready to get back on track.  Subjective:   1. Pre-diabetes Eric [name] is stable on his medications, and he denies nausea, vomiting, or hypoglycemia.  2. Vitamin D deficiency Eric [name] is stable on Vit D, and he denies signs of over-replacement.  3. Other depression with emotional eating Eric [name] is stable on his medications, and he is working on decreasing emotional eating behaviors. No side effects noted.  Assessment/Plan:   1. Pre-diabetes Eric [name] will continue to work on weight loss, exercise, and decreasing simple carbohydrates to help decrease the risk of diabetes. We will refill metformin 500 mg q AM #30 for 2 months.  2. Vitamin D deficiency Low Vitamin D level contributes to fatigue and are associated with obesity, breast, and colon cancer. We will refill prescription Vitamin D 50,000 IU every week #4 for 2 months. Eric [name] will follow-up for routine testing of Vitamin D, at least 2-3 times per year to avoid over-replacement.  3. Other depression with emotional eating Behavior modification techniques were discussed today to help Eric [name] deal with his emotional/non-hunger eating behaviors. We will refill Wellbutrin SR 200 mg q daily for 2 months. Orders and follow up as documented in patient record.   4. Obesity with current BMI  35.4 Eric [name] is currently in the action stage of change. As such, his goal is to continue with weight loss efforts. He has agreed to the Category 3 Plan.   Exercise goals: As is.  Behavioral modification strategies: increasing lean protein intake.  Eric [name] has agreed to follow-up with our clinic in 4 weeks. He was informed of the importance of frequent follow-up visits to maximize his success with intensive lifestyle modifications for his multiple health conditions.   Objective:   Blood pressure 115/75, pulse (!) 59, temperature 97.9 F (36.6 C), height '5\' 8"'$  (1.727 m), weight 232 lb (105.2 kg), SpO2 97 %. Body mass index is 35.28 kg/m.  General: Cooperative, alert, well developed, in no acute distress. HEENT: Conjunctivae and lids unremarkable. Cardiovascular: Regular rhythm.  Lungs: Normal work of breathing. Neurologic: No focal deficits.   Lab Results  Component Value Date   CREATININE 0.95 09/01/2020   BUN 21 09/01/2020   NA 140 09/01/2020   K 4.4 09/01/2020   CL 106 09/01/2020   CO2 28 09/01/2020   Lab Results  Component Value Date   ALT 12 09/01/2020   AST 10 09/01/2020   ALKPHOS 53 09/01/2020   BILITOT 0.6 09/01/2020   Lab Results  Component Value Date   HGBA1C 5.6 09/01/2020   HGBA1C 5.4 03/03/2020   HGBA1C 5.6 10/22/2019   HGBA1C 5.3 06/18/2019   HGBA1C 5.5 03/02/2019   Lab Results  Component Value Date   INSULIN 9.0 10/22/2019   INSULIN 21.3 12/14/2017   Lab Results  Component Value Date   TSH 1.060  12/14/2017   Lab Results  Component Value Date   CHOL 122 09/01/2020   HDL 50.30 09/01/2020   LDLCALC 64 09/01/2020   TRIG 38.0 09/01/2020   CHOLHDL 2 09/01/2020   Lab Results  Component Value Date   VD25OH 44.50 09/01/2020   VD25OH 49.51 03/03/2020   VD25OH 55.6 10/22/2019   Lab Results  Component Value Date   WBC 5.1 09/01/2020   HGB 14.9 09/01/2020   HCT 44.4 09/01/2020   MCV 92.7 09/01/2020   PLT 178.0 09/01/2020   No results found  for: IRON, TIBC, FERRITIN  Attestation Statements:   Reviewed by clinician on day of visit: allergies, medications, problem list, medical history, surgical history, family history, social history, and previous encounter notes.  Time spent on visit including pre-visit chart review and post-visit care and charting was 45 minutes.    I, Trixie Dredge, am acting as transcriptionist for [name], MD.  I have reviewed the above documentation for accuracy and completeness, and I agree with the above. -  [name], MD

## 2020-12-02 ENCOUNTER — Other Ambulatory Visit: Payer: Self-pay | Admitting: Family Medicine

## 2020-12-02 DIAGNOSIS — N400 Enlarged prostate without lower urinary tract symptoms: Secondary | ICD-10-CM

## 2020-12-15 ENCOUNTER — Encounter: Payer: Self-pay | Admitting: Family Medicine

## 2020-12-16 ENCOUNTER — Other Ambulatory Visit: Payer: Self-pay

## 2020-12-16 DIAGNOSIS — G4730 Sleep apnea, unspecified: Secondary | ICD-10-CM

## 2020-12-18 ENCOUNTER — Ambulatory Visit (INDEPENDENT_AMBULATORY_CARE_PROVIDER_SITE_OTHER): Payer: Medicare Other | Admitting: Family Medicine

## 2020-12-18 ENCOUNTER — Other Ambulatory Visit: Payer: Self-pay

## 2020-12-18 ENCOUNTER — Encounter (INDEPENDENT_AMBULATORY_CARE_PROVIDER_SITE_OTHER): Payer: Self-pay | Admitting: Family Medicine

## 2020-12-18 VITALS — BP 111/73 | HR 72 | Temp 98.5°F | Ht 68.0 in | Wt 234.0 lb

## 2020-12-18 DIAGNOSIS — Z6841 Body Mass Index (BMI) 40.0 and over, adult: Secondary | ICD-10-CM | POA: Diagnosis not present

## 2020-12-18 DIAGNOSIS — E1165 Type 2 diabetes mellitus with hyperglycemia: Secondary | ICD-10-CM

## 2020-12-18 DIAGNOSIS — F3289 Other specified depressive episodes: Secondary | ICD-10-CM | POA: Diagnosis not present

## 2020-12-18 DIAGNOSIS — E559 Vitamin D deficiency, unspecified: Secondary | ICD-10-CM | POA: Diagnosis not present

## 2020-12-18 MED ORDER — OZEMPIC (0.25 OR 0.5 MG/DOSE) 2 MG/1.5ML ~~LOC~~ SOPN: 0.2500 mg | PEN_INJECTOR | SUBCUTANEOUS | 0 refills | Status: DC

## 2020-12-18 MED ORDER — BUPROPION HCL ER (SR) 200 MG PO TB12: 200.0000 mg | ORAL_TABLET | Freq: Every day | ORAL | 1 refills | Status: DC

## 2020-12-18 MED ORDER — METFORMIN HCL 500 MG PO TABS: 500.0000 mg | ORAL_TABLET | Freq: Every day | ORAL | 1 refills | Status: DC

## 2020-12-18 MED ORDER — VITAMIN D (ERGOCALCIFEROL) 1.25 MG (50000 UNIT) PO CAPS: ORAL_CAPSULE | ORAL | 1 refills | Status: DC

## 2020-12-18 NOTE — Progress Notes (Signed)
Chief Complaint:   OBESITY Eric [name] is here to discuss his progress with his obesity treatment plan along with follow-up of his obesity related diagnoses. Kurk is on the Category 3 Plan and states he is following his eating plan approximately 75% of the time. Krzysztof states he is doing yard work for 2-3 hours 3 times per week.   Today's visit was #: 41 Starting weight: 288 lbs Starting date: 12/14/2017 Today's weight: 234 lbs Today's date: 12/18/2020 Total lbs lost to date: 54 Total lbs lost since last in-office visit: 0  Interim History: Sanchez was on vacation and he did some celebration eating. He notes mid-day hunger is becoming more of an issue.  Subjective:   1. Type 2 diabetes mellitus with hyperglycemia, without long-term current use of insulin (HCC) Bennie is on metformin with controlled glucose levels, but he notes increased polyphagia especially in the afternoons.  2. Vitamin D deficiency Emaad is stable on Vit D, and he denies nausea, vomiting, or muscle weakness.  3. Other depression with emotional eating Treshun is tolerating Wellbutrin with no side effects noted. He continues to work on decreasing emotional eating behaviors. His blood pressure is stable.  Assessment/Plan:   1. Type 2 diabetes mellitus with hyperglycemia, without long-term current use of insulin ([facility]) Dyson agreed to start Ozempic 0.25 mg q weekly with no refills, and we will refill metformin for 1 month. Good blood sugar control is important to decrease the likelihood of diabetic complications such as nephropathy, neuropathy, limb loss, blindness, coronary artery disease, and death. Intensive lifestyle modification including diet, exercise and weight loss are the first line of treatment for diabetes.   - metFORMIN (GLUCOPHAGE) 500 MG tablet; Take 1 tablet (500 mg total) by mouth daily with breakfast.  Dispense: 30 tablet; Refill: 1 - Semaglutide,0.25 or 0.5MG /DOS, (OZEMPIC, 0.25 OR 0.5  MG/DOSE,) 2 MG/1.5ML SOPN; Inject 0.25 mg into the skin once a week.  Dispense: 1.5 mL; Refill: 0  2. Vitamin D deficiency Low Vitamin D level contributes to fatigue and are associated with obesity, breast, and colon cancer. We will refill prescription Vitamin D for 2 months. Leomar will follow-up for routine testing of Vitamin D, at least 2-3 times per year to avoid over-replacement.  - Vitamin D, Ergocalciferol, (DRISDOL) 1.25 MG (50000 UNIT) CAPS capsule; TAKE ONE CAPSULE BY MOUTH EVERY 7 DAYS  Dispense: 4 capsule; Refill: 1  3. Other depression with emotional eating Behavior modification techniques were discussed today to help Deshay deal with his emotional/non-hunger eating behaviors. We will refill Wellbutrin SR for 2 months. Orders and follow up as documented in patient record.   - buPROPion (WELLBUTRIN SR) 200 MG 12 hr tablet; Take 1 tablet (200 mg total) by mouth daily.  Dispense: 30 tablet; Refill: 1  4. Obesity with current BMI 35.7 Juliocesar is currently in the action stage of change. As such, his goal is to continue with weight loss efforts. He has agreed to the Category 3 Plan.   Exercise goals: As is.  Behavioral modification strategies: no skipping meals and meal planning and cooking strategies.  Dajuan has agreed to follow-up with our clinic in 4 weeks. He was informed of the importance of frequent follow-up visits to maximize his success with intensive lifestyle modifications for his multiple health conditions.   Objective:   Blood pressure 111/73, pulse 72, temperature 98.5 F (36.9 C), height 5\' 8"  (1.727 m), weight 234 lb (106.1 kg), SpO2 98 %. Body mass index is 35.58 kg/m.  General: Cooperative, alert, well developed, in no acute distress. HEENT: Conjunctivae and lids unremarkable. Cardiovascular: Regular rhythm.  Lungs: Normal work of breathing. Neurologic: No focal deficits.   Lab Results  Component Value Date   CREATININE 0.95 09/01/2020   BUN 21  09/01/2020   NA 140 09/01/2020   K 4.4 09/01/2020   CL 106 09/01/2020   CO2 28 09/01/2020   Lab Results  Component Value Date   ALT 12 09/01/2020   AST 10 09/01/2020   ALKPHOS 53 09/01/2020   BILITOT 0.6 09/01/2020   Lab Results  Component Value Date   HGBA1C 5.6 09/01/2020   HGBA1C 5.4 03/03/2020   HGBA1C 5.6 10/22/2019   HGBA1C 5.3 06/18/2019   HGBA1C 5.5 03/02/2019   Lab Results  Component Value Date   INSULIN 9.0 10/22/2019   INSULIN 21.3 12/14/2017   Lab Results  Component Value Date   TSH 1.060 12/14/2017   Lab Results  Component Value Date   CHOL 122 09/01/2020   HDL 50.30 09/01/2020   LDLCALC 64 09/01/2020   TRIG 38.0 09/01/2020   CHOLHDL 2 09/01/2020   Lab Results  Component Value Date   VD25OH 44.50 09/01/2020   VD25OH 49.51 03/03/2020   VD25OH 55.6 10/22/2019   Lab Results  Component Value Date   WBC 5.1 09/01/2020   HGB 14.9 09/01/2020   HCT 44.4 09/01/2020   MCV 92.7 09/01/2020   PLT 178.0 09/01/2020   No results found for: IRON, TIBC, FERRITIN  Obesity Behavioral Intervention:   Approximately 15 minutes were spent on the discussion below.  ASK: We discussed the diagnosis of obesity with [name] today and Carter agreed to give [place] permission to discuss obesity behavioral modification therapy today.  ASSESS: Jernard has the diagnosis of obesity and his BMI today is 35.7. Kaj is in the action stage of change.   ADVISE: Chanan was educated on the multiple health risks of obesity as well as the benefit of weight loss to improve his health. He was advised of the need for long term treatment and the importance of lifestyle modifications to improve his current health and to decrease his risk of future health problems.  AGREE: Multiple dietary modification options and treatment options were discussed and Ramondo agreed to follow the recommendations documented in the above note.  ARRANGE: Shaheim was educated on the importance of  frequent visits to treat obesity as outlined per CMS and USPSTF guidelines and agreed to schedule his next follow up appointment today.  Attestation Statements:   Reviewed by clinician on day of visit: allergies, medications, problem list, medical history, surgical history, family history, social history, and previous encounter notes.   I, Trixie Dredge, am acting as transcriptionist for  [name], MD.  I have reviewed the above documentation for accuracy and completeness, and I agree with the above. -  [name], MD

## 2020-12-22 ENCOUNTER — Encounter (INDEPENDENT_AMBULATORY_CARE_PROVIDER_SITE_OTHER): Payer: Self-pay

## 2020-12-22 DIAGNOSIS — E1165 Type 2 diabetes mellitus with hyperglycemia: Secondary | ICD-10-CM

## 2020-12-26 DIAGNOSIS — M47816 Spondylosis without myelopathy or radiculopathy, lumbar region: Secondary | ICD-10-CM | POA: Diagnosis not present

## 2020-12-26 DIAGNOSIS — M5136 Other intervertebral disc degeneration, lumbar region: Secondary | ICD-10-CM | POA: Diagnosis not present

## 2020-12-29 ENCOUNTER — Encounter (INDEPENDENT_AMBULATORY_CARE_PROVIDER_SITE_OTHER): Payer: Self-pay | Admitting: Family Medicine

## 2020-12-29 MED ORDER — OZEMPIC (0.25 OR 0.5 MG/DOSE) 2 MG/1.5ML ~~LOC~~ SOPN: 0.2500 mg | PEN_INJECTOR | SUBCUTANEOUS | 0 refills | Status: DC

## 2020-12-29 NOTE — Telephone Encounter (Addendum)
I don't send in three month supply for medication to pharmacy. Clinical sends that to pharmacy when script is sent in. Provider would have to approve 3 month supply.

## 2020-12-29 NOTE — Telephone Encounter (Signed)
Please see message and advise.  Thank you. ° °

## 2020-12-29 NOTE — Telephone Encounter (Signed)
Hey, can we send this in for 3 months according to [facility]?  Please advise.

## 2020-12-29 NOTE — Telephone Encounter (Signed)
Ok x 90 days  

## 2021-01-08 NOTE — Telephone Encounter (Signed)
Dr.Beasley 

## 2021-01-12 DIAGNOSIS — M47816 Spondylosis without myelopathy or radiculopathy, lumbar region: Secondary | ICD-10-CM | POA: Diagnosis not present

## 2021-01-16 ENCOUNTER — Encounter: Payer: Self-pay | Admitting: Neurology

## 2021-01-16 ENCOUNTER — Encounter (INDEPENDENT_AMBULATORY_CARE_PROVIDER_SITE_OTHER): Payer: Self-pay | Admitting: Family Medicine

## 2021-01-19 DIAGNOSIS — M48061 Spinal stenosis, lumbar region without neurogenic claudication: Secondary | ICD-10-CM | POA: Diagnosis not present

## 2021-01-19 NOTE — Telephone Encounter (Signed)
This is a new evaluation for sleep apnea, we will have to focus on OSA at the time.

## 2021-01-20 ENCOUNTER — Other Ambulatory Visit: Payer: Self-pay

## 2021-01-20 ENCOUNTER — Ambulatory Visit (INDEPENDENT_AMBULATORY_CARE_PROVIDER_SITE_OTHER): Payer: Medicare Other | Admitting: Family Medicine

## 2021-01-20 ENCOUNTER — Encounter (INDEPENDENT_AMBULATORY_CARE_PROVIDER_SITE_OTHER): Payer: Self-pay | Admitting: Family Medicine

## 2021-01-20 VITALS — BP 125/75 | HR 53 | Temp 97.8°F | Ht 68.0 in | Wt 238.0 lb

## 2021-01-20 DIAGNOSIS — E559 Vitamin D deficiency, unspecified: Secondary | ICD-10-CM

## 2021-01-20 DIAGNOSIS — E1165 Type 2 diabetes mellitus with hyperglycemia: Secondary | ICD-10-CM | POA: Diagnosis not present

## 2021-01-20 DIAGNOSIS — Z6841 Body Mass Index (BMI) 40.0 and over, adult: Secondary | ICD-10-CM

## 2021-01-20 MED ORDER — VITAMIN D (ERGOCALCIFEROL) 1.25 MG (50000 UNIT) PO CAPS: ORAL_CAPSULE | ORAL | 0 refills | Status: DC

## 2021-01-20 MED ORDER — OZEMPIC (0.25 OR 0.5 MG/DOSE) 2 MG/1.5ML ~~LOC~~ SOPN: 0.5000 mg | PEN_INJECTOR | SUBCUTANEOUS | 0 refills | Status: DC

## 2021-01-20 MED ORDER — METFORMIN HCL 500 MG PO TABS: 500.0000 mg | ORAL_TABLET | Freq: Every day | ORAL | 0 refills | Status: DC

## 2021-01-20 NOTE — Progress Notes (Signed)
Chief Complaint:   OBESITY Eric [name] is here to discuss his progress with his obesity treatment plan along with follow-up of his obesity related diagnoses. Eric [name] is on the Category 3 Plan and states he is following his eating plan approximately 50% of the time. Eric [name] states he is doing yard work for 120 minutes 3 times per week.  Today's visit was #: 66 Starting weight: 288 lbs Starting date: 12/14/2017 Today's weight: 238 lbs Today's date: 01/20/2021 Total lbs lost to date: 50 Total lbs lost since last in-office visit: 0  Interim History: Eric [name] did some increase in boredom eating since his last visit, when his wife was out of town. He is ready to get back on track.  Subjective:   1. Type 2 diabetes mellitus with hyperglycemia, without long-term current use of insulin (HCC) Eric [name] has had 3 doses of Ozempic. He noted mild increase in reflux but no nausea.  2. Vitamin D deficiency Eric [name] is on Vit D, but his level is not yet at goal.  Assessment/Plan:   1. Type 2 diabetes mellitus with hyperglycemia, without long-term current use of insulin (Eric [name]) Eric [name] agreed to increase Ozempic to 0.5 mg q weekly with a 90 day supply with no refills, and we will refill metformin for 1 month. Good blood sugar control is important to decrease the likelihood of diabetic complications such as nephropathy, neuropathy, limb loss, blindness, coronary artery disease, and death. Intensive lifestyle modification including diet, exercise and weight loss are the first line of treatment for diabetes.   - metFORMIN (GLUCOPHAGE) 500 MG tablet; Take 1 tablet (500 mg total) by mouth daily with breakfast.  Dispense: 30 tablet; Refill: 0 - Semaglutide,0.25 or 0.5MG /DOS, (OZEMPIC, 0.25 OR 0.5 MG/DOSE,) 2 MG/1.5ML SOPN; Inject 0.5 mg into the skin once a week.  Dispense: 4.5 mL; Refill: 0  2. Vitamin D deficiency Low Vitamin D level contributes to fatigue and are associated with obesity, breast, and colon  cancer. We will refill prescription Vitamin D for 1 month. Eric [name] will follow-up for routine testing of Vitamin D, at least 2-3 times per year to avoid over-replacement.  - Vitamin D, Ergocalciferol, (DRISDOL) 1.25 MG (50000 UNIT) CAPS capsule; TAKE ONE CAPSULE BY MOUTH EVERY 7 DAYS  Dispense: 4 capsule; Refill: 0  3. Obesity with current BMI of 36.3 Eric [name] is currently in the action stage of change. As such, his goal is to continue with weight loss efforts. He has agreed to the Category 3 Plan.   Exercise goals: As is.  Behavioral modification strategies: increasing lean protein intake and meal planning and cooking strategies.  Eric [name] has agreed to follow-up with our clinic in 4 weeks. He was informed of the importance of frequent follow-up visits to maximize his success with intensive lifestyle modifications for his multiple health conditions.   Objective:   Blood pressure 125/75, pulse (!) 53, temperature 97.8 F (36.6 C), height 5\' 8"  (1.727 m), weight 238 lb (108 kg), SpO2 97 %. Body mass index is 36.19 kg/m.  General: Cooperative, alert, well developed, in no acute distress. HEENT: Conjunctivae and lids unremarkable. Cardiovascular: Regular rhythm.  Lungs: Normal work of breathing. Neurologic: No focal deficits.   Lab Results  Component Value Date   CREATININE 0.95 09/01/2020   BUN 21 09/01/2020   NA 140 09/01/2020   K 4.4 09/01/2020   CL 106 09/01/2020   CO2 28 09/01/2020   Lab Results  Component Value Date   ALT 12 09/01/2020   AST 10 09/01/2020  ALKPHOS 53 09/01/2020   BILITOT 0.6 09/01/2020   Lab Results  Component Value Date   HGBA1C 5.6 09/01/2020   HGBA1C 5.4 03/03/2020   HGBA1C 5.6 10/22/2019   HGBA1C 5.3 06/18/2019   HGBA1C 5.5 03/02/2019   Lab Results  Component Value Date   INSULIN 9.0 10/22/2019   INSULIN 21.3 12/14/2017   Lab Results  Component Value Date   TSH 1.060 12/14/2017   Lab Results  Component Value Date   CHOL 122  09/01/2020   HDL 50.30 09/01/2020   LDLCALC 64 09/01/2020   TRIG 38.0 09/01/2020   CHOLHDL 2 09/01/2020   Lab Results  Component Value Date   VD25OH 44.50 09/01/2020   VD25OH 49.51 03/03/2020   VD25OH 55.6 10/22/2019   Lab Results  Component Value Date   WBC 5.1 09/01/2020   HGB 14.9 09/01/2020   HCT 44.4 09/01/2020   MCV 92.7 09/01/2020   PLT 178.0 09/01/2020   No results found for: IRON, TIBC, FERRITIN  Obesity Behavioral Intervention:   Approximately 15 minutes were spent on the discussion below.  ASK: We discussed the diagnosis of obesity with Eric [name] today and Eric [name] agreed to give [place] permission to discuss obesity behavioral modification therapy today.  ASSESS: Eric [name] has the diagnosis of obesity and his BMI today is 36.3. Eric [name] is in the action stage of change.   ADVISE: Eric [name] was educated on the multiple health risks of obesity as well as the benefit of weight loss to improve his health. He was advised of the need for long term treatment and the importance of lifestyle modifications to improve his current health and to decrease his risk of future health problems.  AGREE: Multiple dietary modification options and treatment options were discussed and Eric [name] agreed to follow the recommendations documented in the above note.  ARRANGE: Eric [name] was educated on the importance of frequent visits to treat obesity as outlined per CMS and USPSTF guidelines and agreed to schedule his next follow up appointment today.  Attestation Statements:   Reviewed by clinician on day of visit: allergies, medications, problem list, medical history, surgical history, family history, social history, and previous encounter notes.  I, Eric [name], am acting as transcriptionist for [name], MD.  I have reviewed the above documentation for accuracy and completeness, and I agree with the above. -  [name], MD

## 2021-02-02 ENCOUNTER — Encounter: Payer: Self-pay | Admitting: Family Medicine

## 2021-02-02 ENCOUNTER — Telehealth: Payer: Medicare Other | Admitting: Physician Assistant

## 2021-02-02 DIAGNOSIS — B9689 Other specified bacterial agents as the cause of diseases classified elsewhere: Secondary | ICD-10-CM | POA: Diagnosis not present

## 2021-02-02 DIAGNOSIS — J208 Acute bronchitis due to other specified organisms: Secondary | ICD-10-CM | POA: Diagnosis not present

## 2021-02-02 MED ORDER — ALBUTEROL SULFATE HFA 108 (90 BASE) MCG/ACT IN AERS: 2.0000 | INHALATION_SPRAY | Freq: Four times a day (QID) | RESPIRATORY_TRACT | 0 refills | Status: DC | PRN

## 2021-02-02 MED ORDER — PREDNISONE 20 MG PO TABS
40.0000 mg | ORAL_TABLET | Freq: Every day | ORAL | 0 refills | Status: DC
Start: 2021-02-02 — End: 2021-02-10

## 2021-02-02 MED ORDER — BENZONATATE 100 MG PO CAPS: 100.0000 mg | ORAL_CAPSULE | Freq: Three times a day (TID) | ORAL | 0 refills | Status: DC | PRN

## 2021-02-02 MED ORDER — AZITHROMYCIN 250 MG PO TABS: ORAL_TABLET | ORAL | 0 refills | Status: DC

## 2021-02-02 NOTE — Progress Notes (Signed)
Virtual Visit Consent   [name], you are scheduled for a virtual visit with a [facility] provider today.     Just as with appointments in the office, your consent must be obtained to participate.  Your consent will be active for this visit and any virtual visit you may have with one of our providers in the next 365 days.     If you have a MyChart account, a copy of this consent can be sent to you electronically.  All virtual visits are billed to your insurance company just like a traditional visit in the office.    As this is a virtual visit, video technology does not allow for your provider to perform a traditional examination.  This may limit your provider's ability to fully assess your condition.  If your provider identifies any concerns that need to be evaluated in person or the need to arrange testing (such as labs, EKG, etc.), we will make arrangements to do so.     Although advances in technology are sophisticated, we cannot ensure that it will always work on either your end or our end.  If the connection with a video visit is poor, the visit may have to be switched to a telephone visit.  With either a video or telephone visit, we are not always able to ensure that we have a secure connection.     I need to obtain your verbal consent now.   Are you willing to proceed with your visit today?    Eric [name] has provided verbal consent on 02/02/2021 for a virtual visit (video or telephone).   Mar Daring, PA-C   Date: 02/02/2021 8:45 AM   Virtual Visit via Video Note   I, Mar Daring, connected with  Eric [name]  ([id number], [birth year]) on 02/02/21 at  8:15 AM EST by a video-enabled telemedicine application and verified that I am speaking with the correct person using two identifiers.  Location: Patient: Virtual Visit Location Patient: Home Provider: Virtual Visit Location Provider: Home Office   I discussed the limitations of evaluation and  management by telemedicine and the availability of in person appointments. The patient expressed understanding and agreed to proceed.    History of Present Illness: Eric [name] is a 75 y.o. who identifies as a male who was assigned male at birth, and is being seen today for URI symptoms.  HPI: URI  This is a new problem. The current episode started 1 to 4 weeks ago. The problem has been unchanged. There has been no fever. Associated symptoms include chest pain, congestion, coughing, rhinorrhea and wheezing. Associated symptoms comments: O2 down to 92%. He has tried nothing for the symptoms.  Has had post viral cough for 3 months after Covid. Noticed symptoms worsening last week, including change in mucous production, increased wheezing, and O2 sat decreasing.    Problems:  Patient Active Problem List   Diagnosis Date Noted   Depression 04/02/2020   Viral upper respiratory tract infection 54/00/8676   Folliculitis 19/50/9326   Preventative health care 03/02/2019   Morbid obesity ([place]) 03/02/2019   Hypotension 10/11/2018   Status post total hip replacement, right 09/25/2018   Osteoarthritis of right hip 09/21/2018   Prediabetes 08/29/2018   Vitamin D deficiency 08/29/2018   Primary osteoarthritis of right hip 08/29/2018   Thyroid nodule 05/12/2017   Primary osteoarthritis of left hip 03/28/2017   Status post total replacement of left hip 03/28/2017   Osteoarthritis of  left hip 03/25/2017   Essential hypertension 11/18/2016   Hyperlipidemia LDL goal <100 11/18/2016   Need for prophylactic vaccination and inoculation against influenza 11/18/2016   Primary localized osteoarthrosis of right shoulder 05/25/2016   History of total replacement of right shoulder joint 05/25/2016   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   S/P shoulder replacement 10/14/2015   Low back pain 09/03/2015   Left foot pain 04/04/2014   Left shoulder pain 02/20/2014   Palpitations 12/25/2012    Left hip pain 12/25/2012   Class 3 severe obesity with serious comorbidity and body mass index (BMI) of 40.0 to 44.9 in adult [facility]) 12/25/2012   Acute bronchitis 12/25/2012   Urgency of urination 12/24/2011   Urinary frequency 12/24/2011   Pre-op evaluation 10/08/2010   Hyperlipidemia 10/08/2010   DECREASED HEARING 09/04/2009   INTERNAL HEMORRHOIDS WITHOUT MENTION COMP 09/04/2009   ALLERGIC RHINITIS, SEASONAL 09/04/2009   GERD 09/04/2009   DIVERTICULOSIS, COLON 09/04/2009   OSTEOARTHRITIS 09/04/2009   Sleep apnea 09/04/2009    Allergies:  Allergies  Allergen Reactions   Lisinopril Cough   Medications:  Current Outpatient Medications:    albuterol (VENTOLIN HFA) 108 (90 Base) MCG/ACT inhaler, Inhale 2 puffs into the lungs every 6 (six) hours as needed for wheezing or shortness of breath., Disp: 8 g, Rfl: 0   azithromycin (ZITHROMAX) 250 MG tablet, Take 2 tablets PO on day one, and one tablet PO daily thereafter until completed., Disp: 6 tablet, Rfl: 0   benzonatate (TESSALON) 100 MG capsule, Take 1 capsule (100 mg total) by mouth 3 (three) times daily as needed., Disp: 30 capsule, Rfl: 0   predniSONE (DELTASONE) 20 MG tablet, Take 2 tablets (40 mg total) by mouth daily with breakfast., Disp: 10 tablet, Rfl: 0   aspirin EC 81 MG tablet, Take 1 tablet (81 mg total) by mouth 2 (two) times daily. (Patient taking differently: Take 81 mg by mouth once.), Disp: 60 tablet, Rfl: 0   atorvastatin (LIPITOR) 40 MG tablet, TAKE 1 TABLET DAILY, Disp: 90 tablet, Rfl: 3   clindamycin (CLEOCIN-T) 1 % lotion, Apply topically 2 (two) times daily., Disp: 60 mL, Rfl: 2   Coenzyme Q10 (COQ10) 200 MG CAPS, Take 200 mg by mouth at bedtime., Disp: , Rfl:    fluocinonide ointment (LIDEX) 7.16 %, Apply 1 application topically daily as needed (for bites)., Disp: 30 g, Rfl: 3   fluticasone (FLONASE) 50 MCG/ACT nasal spray, Place 2 sprays into both nostrils daily as needed for allergies. , Disp: , Rfl:     loratadine (CLARITIN) 10 MG tablet, Take 10 mg by mouth daily as needed for allergies. , Disp: , Rfl:    meloxicam (MOBIC) 15 MG tablet, TAKE 1 TABLET DAILY, Disp: 90 tablet, Rfl: 1   metFORMIN (GLUCOPHAGE) 500 MG tablet, Take 1 tablet (500 mg total) by mouth daily with breakfast., Disp: 30 tablet, Rfl: 0   pantoprazole (PROTONIX) 40 MG tablet, TAKE 1 TABLET DAILY, Disp: 90 tablet, Rfl: 3   Semaglutide,0.25 or 0.5MG /DOS, (OZEMPIC, 0.25 OR 0.5 MG/DOSE,) 2 MG/1.5ML SOPN, Inject 0.5 mg into the skin once a week., Disp: 4.5 mL, Rfl: 0   tamsulosin (FLOMAX) 0.4 MG CAPS capsule, TAKE 1 CAPSULE DAILY, Disp: 90 capsule, Rfl: 1   valsartan (DIOVAN) 80 MG tablet, TAKE 1 TABLET DAILY, Disp: 90 tablet, Rfl: 1   Vitamin D, Ergocalciferol, (DRISDOL) 1.25 MG (50000 UNIT) CAPS capsule, TAKE ONE CAPSULE BY MOUTH EVERY 7 DAYS, Disp: 4 capsule, Rfl: 0   Zinc  50 MG CAPS, Take by mouth daily., Disp: , Rfl:   Observations/Objective: Patient is well-developed, well-nourished in no acute distress.  Resting comfortably at home.  Head is normocephalic, atraumatic.  No labored breathing.  Speech is clear and coherent with logical content.  Patient is alert and oriented at baseline.    Assessment and Plan: 1. Acute bacterial bronchitis - azithromycin (ZITHROMAX) 250 MG tablet; Take 2 tablets PO on day one, and one tablet PO daily thereafter until completed.  Dispense: 6 tablet; Refill: 0 - albuterol (VENTOLIN HFA) 108 (90 Base) MCG/ACT inhaler; Inhale 2 puffs into the lungs every 6 (six) hours as needed for wheezing or shortness of breath.  Dispense: 8 g; Refill: 0 - benzonatate (TESSALON) 100 MG capsule; Take 1 capsule (100 mg total) by mouth 3 (three) times daily as needed.  Dispense: 30 capsule; Refill: 0 - predniSONE (DELTASONE) 20 MG tablet; Take 2 tablets (40 mg total) by mouth daily with breakfast.  Dispense: 10 tablet; Refill: 0  - Worsening of acute on chronic issue - Will treat with zpak, prednisone,  albuterol and tessalon perles.  - Push fluids.  - Rest.  - Seek in person evaluation if worsening.   Follow Up Instructions: I discussed the assessment and treatment plan with the patient. The patient was provided an opportunity to ask questions and all were answered. The patient agreed with the plan and demonstrated an understanding of the instructions.  A copy of instructions were sent to the patient via MyChart unless otherwise noted below.   The patient was advised to call back or seek an in-person evaluation if the symptoms worsen or if the condition fails to improve as anticipated.  Time:  I spent 12 minutes with the patient via telehealth technology discussing the above problems/concerns.    Mar Daring, PA-C

## 2021-02-02 NOTE — Patient Instructions (Signed)
Eric [name], thank you for joining Mar Daring, PA-C for today's virtual visit.  While this provider is not your primary care provider (PCP), if your PCP is located in our provider database this encounter information will be shared with them immediately following your visit.  Consent: (Patient) Eric [name] provided verbal consent for this virtual visit at the beginning of the encounter.  Current Medications:  Current Outpatient Medications:    albuterol (VENTOLIN HFA) 108 (90 Base) MCG/ACT inhaler, Inhale 2 puffs into the lungs every 6 (six) hours as needed for wheezing or shortness of breath., Disp: 8 g, Rfl: 0   azithromycin (ZITHROMAX) 250 MG tablet, Take 2 tablets PO on day one, and one tablet PO daily thereafter until completed., Disp: 6 tablet, Rfl: 0   benzonatate (TESSALON) 100 MG capsule, Take 1 capsule (100 mg total) by mouth 3 (three) times daily as needed., Disp: 30 capsule, Rfl: 0   predniSONE (DELTASONE) 20 MG tablet, Take 2 tablets (40 mg total) by mouth daily with breakfast., Disp: 10 tablet, Rfl: 0   aspirin EC 81 MG tablet, Take 1 tablet (81 mg total) by mouth 2 (two) times daily. (Patient taking differently: Take 81 mg by mouth once.), Disp: 60 tablet, Rfl: 0   atorvastatin (LIPITOR) 40 MG tablet, TAKE 1 TABLET DAILY, Disp: 90 tablet, Rfl: 3   clindamycin (CLEOCIN-T) 1 % lotion, Apply topically 2 (two) times daily., Disp: 60 mL, Rfl: 2   Coenzyme Q10 (COQ10) 200 MG CAPS, Take 200 mg by mouth at bedtime., Disp: , Rfl:    fluocinonide ointment (LIDEX) 0.56 %, Apply 1 application topically daily as needed (for bites)., Disp: 30 g, Rfl: 3   fluticasone (FLONASE) 50 MCG/ACT nasal spray, Place 2 sprays into both nostrils daily as needed for allergies. , Disp: , Rfl:    loratadine (CLARITIN) 10 MG tablet, Take 10 mg by mouth daily as needed for allergies. , Disp: , Rfl:    meloxicam (MOBIC) 15 MG tablet, TAKE 1 TABLET DAILY, Disp: 90 tablet, Rfl: 1   metFORMIN  (GLUCOPHAGE) 500 MG tablet, Take 1 tablet (500 mg total) by mouth daily with breakfast., Disp: 30 tablet, Rfl: 0   pantoprazole (PROTONIX) 40 MG tablet, TAKE 1 TABLET DAILY, Disp: 90 tablet, Rfl: 3   Semaglutide,0.25 or 0.5MG /DOS, (OZEMPIC, 0.25 OR 0.5 MG/DOSE,) 2 MG/1.5ML SOPN, Inject 0.5 mg into the skin once a week., Disp: 4.5 mL, Rfl: 0   tamsulosin (FLOMAX) 0.4 MG CAPS capsule, TAKE 1 CAPSULE DAILY, Disp: 90 capsule, Rfl: 1   valsartan (DIOVAN) 80 MG tablet, TAKE 1 TABLET DAILY, Disp: 90 tablet, Rfl: 1   Vitamin D, Ergocalciferol, (DRISDOL) 1.25 MG (50000 UNIT) CAPS capsule, TAKE ONE CAPSULE BY MOUTH EVERY 7 DAYS, Disp: 4 capsule, Rfl: 0   Zinc 50 MG CAPS, Take by mouth daily., Disp: , Rfl:    Medications ordered in this encounter:  Meds ordered this encounter  Medications   azithromycin (ZITHROMAX) 250 MG tablet    Sig: Take 2 tablets PO on day one, and one tablet PO daily thereafter until completed.    Dispense:  6 tablet    Refill:  0    Order Specific Question:   Supervising Provider    Answer:   MILLER, BRIAN [[id number]]   albuterol (VENTOLIN HFA) 108 (90 Base) MCG/ACT inhaler    Sig: Inhale 2 puffs into the lungs every 6 (six) hours as needed for wheezing or shortness of breath.    Dispense:  8 g    Refill:  0    Order Specific Question:   Supervising Provider    Answer:   MILLER, BRIAN [[id number]]   benzonatate (TESSALON) 100 MG capsule    Sig: Take 1 capsule (100 mg total) by mouth 3 (three) times daily as needed.    Dispense:  30 capsule    Refill:  0    Order Specific Question:   Supervising Provider    Answer:   Sabra Heck, BRIAN [[id number]]   predniSONE (DELTASONE) 20 MG tablet    Sig: Take 2 tablets (40 mg total) by mouth daily with breakfast.    Dispense:  10 tablet    Refill:  0    Order Specific Question:   Supervising Provider    Answer:   Sabra Heck, BRIAN [[id number]]     *If you need refills on other medications prior to your next appointment, please contact your  pharmacy*  Follow-Up: Call back or seek an in-person evaluation if the symptoms worsen or if the condition fails to improve as anticipated.  Other Instructions Acute Bronchitis, Adult Acute bronchitis is sudden inflammation of the main airways (bronchi) that come off the windpipe (trachea) in the lungs. The swelling causes the airways to get smaller and make more mucus than normal. This can make it hard to breathe and can cause coughing or noisy breathing (wheezing). Acute bronchitis may last several weeks. The cough may last longer. Allergies, asthma, and exposure to smoke may make the condition worse. What are the causes? This condition can be caused by germs and by substances that irritate the lungs, including: Cold and flu viruses. The most common cause of this condition is the virus that causes the common cold. Bacteria. This is less common. Breathing in substances that irritate the lungs, including: Smoke from cigarettes and other forms of tobacco. Dust and pollen. Fumes from household cleaning products, gases, or burned fuel. Indoor or outdoor air pollution. What increases the risk? The following factors may make you more likely to develop this condition: A weak body's defense system, also called the immune system. A condition that affects your lungs and breathing, such as asthma. What are the signs or symptoms? Common symptoms of this condition include: Coughing. This may bring up clear, yellow, or green mucus from your lungs (sputum). Wheezing. Runny or stuffy nose. Having too much mucus in your lungs (chest congestion). Shortness of breath. Aches and pains, including sore throat or chest. How is this diagnosed? This condition is usually diagnosed based on: Your symptoms and medical history. A physical exam. You may also have other tests, including tests to rule out other conditions, such as pneumonia. These tests include: A test of lung function. Test of a mucus sample  to look for the presence of bacteria. Tests to check the oxygen level in your blood. Blood tests. Chest X-ray. How is this treated? Most cases of acute bronchitis clear up over time without treatment. Your health care provider may recommend: Drinking more fluids to help thin your mucus so it is easier to cough up. Taking inhaled medicine (inhaler) to improve air flow in and out of your lungs. Using a vaporizer or a humidifier. These are machines that add water to the air to help you breathe better. Taking a medicine that thins mucus and clears congestion (expectorant). Taking a medicine that prevents or stops coughing (cough suppressant). It is notcommon to take an antibiotic medicine for this condition. Follow these instructions at home:  Take over-the-counter and  prescription medicines only as told by your health care provider. Use an inhaler, vaporizer, or humidifier as told by your health care provider. Take two teaspoons (10 mL) of honey at bedtime to lessen coughing at night. Drink enough fluid to keep your urine pale yellow. Do not use any products that contain nicotine or tobacco. These products include cigarettes, chewing tobacco, and vaping devices, such as e-cigarettes. If you need help quitting, ask your health care provider. Get plenty of rest. Return to your normal activities as told by your health care provider. Ask your health care provider what activities are safe for you. Keep all follow-up visits. This is important. How is this prevented? To lower your risk of getting this condition again: Wash your hands often with soap and water for at least 20 seconds. If soap and water are not available, use hand sanitizer. Avoid contact with people who have cold symptoms. Try not to touch your mouth, nose, or eyes with your hands. Avoid breathing in smoke or chemical fumes. Breathing smoke or chemical fumes will make your condition worse. Get the flu shot every year. Contact a  health care provider if: Your symptoms do not improve after 2 weeks. You have trouble coughing up the mucus. Your cough keeps you awake at night. You have a fever. Get help right away if you: Cough up blood. Feel pain in your chest. Have severe shortness of breath. Faint or keep feeling like you are going to faint. Have a severe headache. Have a fever or chills that get worse. These symptoms may represent a serious problem that is an emergency. Do not wait to see if the symptoms will go away. Get medical help right away. Call your local emergency services (911 in the [place] Do not drive yourself to the hospital. Summary Acute bronchitis is inflammation of the main airways (bronchi) that come off the windpipe (trachea) in the lungs. The swelling causes the airways to get smaller and make more mucus than normal. Drinking more fluids can help thin your mucus so it is easier to cough up. Take over-the-counter and prescription medicines only as told by your health care provider. Do not use any products that contain nicotine or tobacco. These products include cigarettes, chewing tobacco, and vaping devices, such as e-cigarettes. If you need help quitting, ask your health care provider. Contact a health care provider if your symptoms do not improve after 2 weeks. This information is not intended to replace advice given to you by your health care provider. Make sure you discuss any questions you have with your health care provider. Document Revised: 07/09/2020 Document Reviewed: 07/09/2020 Elsevier Patient Education  2022 [employer].    If you have been instructed to have an in-person evaluation today at a local Urgent Care facility, please use the link below. It will take you to a list of all of our available [facility], including address, phone number and hours of operation. Please do not delay care.  [facility]  If you or a family member do not have a primary  care provider, use the link below to schedule a visit and establish care. When you choose a [facility] primary care physician or advanced practice provider, you gain a long-term partner in health. Find a Primary Care Provider  Learn more about [facility] in-office and virtual care options: [facility]

## 2021-02-03 ENCOUNTER — Ambulatory Visit (HOSPITAL_BASED_OUTPATIENT_CLINIC_OR_DEPARTMENT_OTHER)
Admission: RE | Admit: 2021-02-03 | Discharge: 2021-02-03 | Disposition: A | Discharge status: Home / Self Care | Payer: Medicare Other | Source: Ambulatory Visit | Attending: Family Medicine | Admitting: Family Medicine

## 2021-02-03 ENCOUNTER — Other Ambulatory Visit: Payer: Self-pay

## 2021-02-03 ENCOUNTER — Encounter: Payer: Self-pay | Admitting: Family Medicine

## 2021-02-03 ENCOUNTER — Ambulatory Visit (INDEPENDENT_AMBULATORY_CARE_PROVIDER_SITE_OTHER): Payer: Medicare Other | Admitting: Family Medicine

## 2021-02-03 VITALS — BP 128/88 | HR 65 | Temp 98.2°F | Resp 20 | Ht 68.0 in | Wt 246.2 lb

## 2021-02-03 DIAGNOSIS — J40 Bronchitis, not specified as acute or chronic: Secondary | ICD-10-CM

## 2021-02-03 DIAGNOSIS — R059 Cough, unspecified: Secondary | ICD-10-CM | POA: Diagnosis not present

## 2021-02-03 LAB — POC INFLUENZA A&B (BINAX/QUICKVUE)
Influenza A, POC: NEGATIVE
Influenza B, POC: NEGATIVE

## 2021-02-03 LAB — POC COVID19 BINAXNOW: SARS Coronavirus 2 Ag: NEGATIVE

## 2021-02-03 MED ORDER — METHYLPREDNISOLONE ACETATE 80 MG/ML IJ SUSP
80.0000 mg | Freq: Once | INTRAMUSCULAR | Status: AC
Start: 2021-02-03 — End: 2021-02-03
  Administered 2021-02-03: 80 mg via INTRAMUSCULAR

## 2021-02-03 MED ORDER — ALBUTEROL SULFATE (2.5 MG/3ML) 0.083% IN NEBU
2.5000 mg | INHALATION_SOLUTION | Freq: Once | RESPIRATORY_TRACT | Status: AC
  Administered 2021-02-03: 2.5 mg via RESPIRATORY_TRACT

## 2021-02-03 MED ORDER — CEFTRIAXONE SODIUM 1 G IJ SOLR
1.0000 g | Freq: Once | INTRAMUSCULAR | Status: AC
  Administered 2021-02-03: 1 g via INTRAMUSCULAR

## 2021-02-03 NOTE — Assessment & Plan Note (Signed)
Finish abx and pred taper  covid and flu neg

## 2021-02-03 NOTE — Progress Notes (Addendum)
Subjective:   By signing my name below, I, Shehryar Baig, attest that this documentation has been prepared under the direction and in the presence of Dr. Roma Schanz, DO. 02/03/2021    Patient ID: Eric [name], male    DOB: 21-Sep-1945, 75 y.o.   MRN: [id number]  Chief Complaint  Patient presents with   Bronchitis        Follow-up    HPI Patient is in today for a office visit.   He complains of bronchitis. He tested negative for Covid-19 on Saturday. He is coughing regularly. He had a 102.2 fever Saturday, otherwise no other fever symptoms. He denies having any body or muscle aches. He has seen another provider through a virtual visit and was given prednisone, azithromycin and albuterol to manage his symptoms. He finds no change in his symptoms while using albuterol. He reports his O2 decreases to 96 while running and 98 while resting.    Past Medical History:  Diagnosis Date   Alcohol abuse    Allergy    Back pain    Cataract    forming   Colon polyps    Diverticulosis of colon    DJD (degenerative joint disease)    Dysrhythmia    Palpations from caffiene- reports he was having PACs due to slim fast wth caffeine - had full cardiac w/u and was told to cut back on caffeine;  now drink decaf bevrerages and no rpeort of recurrence    Enlarged prostate    GERD (gastroesophageal reflux disease)    Hearing loss    Hip osteoarthritis    Left   History of hay fever    Hx of cardiovascular stress test    Lexiscan Myoview (04/2013):  No ischemia, EF 61%; low risk   Hyperlipemia    Hypertension    Insulin resistance    om metfromin    Internal hemorrhoids without mention of complication    Leg edema    Neuroma of foot    left   Obesity    Osteoarthrosis, unspecified whether generalized or localized, unspecified site    Palpitations    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   Primary localized osteoarthrosis of right shoulder 05/25/2016   Recent  weight loss    75lbs   Seasonal allergies    Sleep apnea    CPAP   Thyroid nodule    calcified thyroid nodules- bx negative    Tremor of both hands     Past Surgical History:  Procedure Laterality Date   COLONOSCOPY     POLYPECTOMY     SHOULDER SURGERY  2009   right   TONSILLECTOMY     TOTAL HIP ARTHROPLASTY Left 03/28/2017   Procedure: TOTAL HIP ARTHROPLASTY ANTERIOR APPROACH;  Surgeon: Frederik Pear, MD;  Location: [facility];  Service: Orthopedics;  Laterality: Left;   TOTAL HIP ARTHROPLASTY Right 09/25/2018   Procedure: Right Anterior Hip Arthroplasty;  Surgeon: Frederik Pear, MD;  Location: [facility];  Service: Orthopedics;  Laterality: Right;   TOTAL SHOULDER ARTHROPLASTY Left 10/14/2015   Procedure: LEFT TOTAL SHOULDER ARTHROPLASTY;  Surgeon: Marchia Bond, MD;  Location: [place];  Service: Orthopedics;  Laterality: Left;   TOTAL SHOULDER ARTHROPLASTY Right 05/25/2016   Procedure: TOTAL SHOULDER ARTHROPLASTY;  Surgeon: Marchia Bond, MD;  Location: [place];  Service: Orthopedics;  Laterality: Right;    Family History  Problem Relation Age of Onset   Arthritis Mother    Cancer Mother 11  breast   Hyperlipidemia Mother    Colon polyps Mother 25   Hypertension Mother    Heart disease Mother    Depression Mother    Anxiety disorder Mother    Obesity Mother    Breast cancer Mother    Heart disease Father        CAD--passed away 2011/06/10 age 28   Hyperlipidemia Father    Hypertension Father    Obesity Father    Diabetes Father    Depression Father    Sleep apnea Father    Cancer Sister 61       breast   Breast cancer Sister    Cancer Brother 79       prostate   Prostate cancer Brother    Breast cancer Other    Prostate cancer Other    Colon cancer Neg Hx    Esophageal cancer Neg Hx    Rectal cancer Neg Hx    Stomach cancer Neg Hx     Social History   Socioeconomic History   Marital status: Married    Spouse name: Fitzroy Mikami    Number of children: 3   Years of  education: Not on file   Highest education level: Not on file  Occupational History   Occupation: Retired--[employer]  Tobacco Use   Smoking status: Never   Smokeless tobacco: Never  Vaping Use   Vaping Use: Never used  Substance and Sexual Activity   Alcohol use: Yes    Alcohol/week: 0.0 standard drinks    Comment: rare----12 a year   Drug use: No   Sexual activity: Yes    Partners: Female  Other Topics Concern   Not on file  Social History Narrative   Lives with wife and daughter and two grandchildren.    Social Determinants of Health   Financial Resource Strain: Not on file  Food Insecurity: Not on file  Transportation Needs: Not on file  Physical Activity: Not on file  Stress: Not on file  Social Connections: Not on file  Intimate Partner Violence: Not on file    Outpatient Medications Prior to Visit  Medication Sig Dispense Refill   albuterol (VENTOLIN HFA) 108 (90 Base) MCG/ACT inhaler Inhale 2 puffs into the lungs every 6 (six) hours as needed for wheezing or shortness of breath. 8 g 0   aspirin EC 81 MG tablet Take 1 tablet (81 mg total) by mouth 2 (two) times daily. (Patient taking differently: Take 81 mg by mouth once.) 60 tablet 0   atorvastatin (LIPITOR) 40 MG tablet TAKE 1 TABLET DAILY 90 tablet 3   azithromycin (ZITHROMAX) 250 MG tablet Take 2 tablets PO on day one, and one tablet PO daily thereafter until completed. 6 tablet 0   benzonatate (TESSALON) 100 MG capsule Take 1 capsule (100 mg total) by mouth 3 (three) times daily as needed. 30 capsule 0   clindamycin (CLEOCIN-T) 1 % lotion Apply topically 2 (two) times daily. 60 mL 2   Coenzyme Q10 (COQ10) 200 MG CAPS Take 200 mg by mouth at bedtime.     fluocinonide ointment (LIDEX) 0.10 % Apply 1 application topically daily as needed (for bites). 30 g 3   fluticasone (FLONASE) 50 MCG/ACT nasal spray Place 2 sprays into both nostrils daily as needed for allergies.      loratadine (CLARITIN) 10 MG  tablet Take 10 mg by mouth daily as needed for allergies.      meloxicam (MOBIC) 15 MG tablet TAKE 1  TABLET DAILY 90 tablet 1   metFORMIN (GLUCOPHAGE) 500 MG tablet Take 1 tablet (500 mg total) by mouth daily with breakfast. 30 tablet 0   pantoprazole (PROTONIX) 40 MG tablet TAKE 1 TABLET DAILY 90 tablet 3   predniSONE (DELTASONE) 20 MG tablet Take 2 tablets (40 mg total) by mouth daily with breakfast. 10 tablet 0   Semaglutide,0.25 or 0.5MG /DOS, (OZEMPIC, 0.25 OR 0.5 MG/DOSE,) 2 MG/1.5ML SOPN Inject 0.5 mg into the skin once a week. 4.5 mL 0   tamsulosin (FLOMAX) 0.4 MG CAPS capsule TAKE 1 CAPSULE DAILY 90 capsule 1   valsartan (DIOVAN) 80 MG tablet TAKE 1 TABLET DAILY 90 tablet 1   Vitamin D, Ergocalciferol, (DRISDOL) 1.25 MG (50000 UNIT) CAPS capsule TAKE ONE CAPSULE BY MOUTH EVERY 7 DAYS 4 capsule 0   Zinc 50 MG CAPS Take by mouth daily.     No facility-administered medications prior to visit.    Allergies  Allergen Reactions   Lisinopril Cough    Review of Systems  Constitutional:  Positive for fever. Negative for chills and malaise/fatigue.  HENT: Negative.    Respiratory:  Positive for cough.   Cardiovascular:  Negative for chest pain.  Musculoskeletal:  Negative for joint pain and myalgias.  Psychiatric/Behavioral: Negative.        Objective:    Physical Exam Constitutional:      General: He is not in acute distress.    Appearance: Normal appearance. He is not ill-appearing.  HENT:     Head: Normocephalic and atraumatic.     Right Ear: External ear normal.     Left Ear: External ear normal.  Eyes:     Extraocular Movements: Extraocular movements intact.     Pupils: Pupils are equal, round, and reactive to light.  Cardiovascular:     Rate and Rhythm: Normal rate and regular rhythm.     Heart sounds: Normal heart sounds. No murmur heard.   No gallop.  Pulmonary:     Effort: Pulmonary effort is normal.     Breath sounds: Examination of the right-upper field reveals  wheezing. Examination of the left-upper field reveals wheezing. Examination of the right-middle field reveals wheezing. Examination of the left-middle field reveals wheezing. Examination of the right-lower field reveals wheezing. Examination of the left-lower field reveals wheezing. Wheezing present.  Skin:    General: Skin is warm and dry.  Neurological:     Mental Status: He is alert and oriented to person, place, and time.  Psychiatric:        Behavior: Behavior normal.        Judgment: Judgment normal.    BP 128/88 (BP Location: Left Arm, Patient Position: Sitting, Cuff Size: Normal)   Pulse 65   Temp 98.2 F (36.8 C) (Oral)   Resp 20   Ht 5\' 8"  (1.727 m)   Wt 246 lb 3.2 oz (111.7 kg)   SpO2 98%   BMI 37.43 kg/m  Wt Readings from Last 3 Encounters:  02/03/21 246 lb 3.2 oz (111.7 kg)  01/20/21 238 lb (108 kg)  12/18/20 234 lb (106.1 kg)    Diabetic Foot Exam - Simple   No data filed    Lab Results  Component Value Date   WBC 5.1 09/01/2020   HGB 14.9 09/01/2020   HCT 44.4 09/01/2020   PLT 178.0 09/01/2020   GLUCOSE 94 09/01/2020   CHOL 122 09/01/2020   TRIG 38.0 09/01/2020   HDL 50.30 09/01/2020   LDLCALC 64 09/01/2020  ALT 12 09/01/2020   AST 10 09/01/2020   NA 140 09/01/2020   K 4.4 09/01/2020   CL 106 09/01/2020   CREATININE 0.95 09/01/2020   BUN 21 09/01/2020   CO2 28 09/01/2020   TSH 1.060 12/14/2017   PSA 1.00 03/03/2020   INR 1.0 09/21/2018   HGBA1C 5.6 09/01/2020   MICROALBUR <0.7 09/01/2020    Lab Results  Component Value Date   TSH 1.060 12/14/2017   Lab Results  Component Value Date   WBC 5.1 09/01/2020   HGB 14.9 09/01/2020   HCT 44.4 09/01/2020   MCV 92.7 09/01/2020   PLT 178.0 09/01/2020   Lab Results  Component Value Date   NA 140 09/01/2020   K 4.4 09/01/2020   CO2 28 09/01/2020   GLUCOSE 94 09/01/2020   BUN 21 09/01/2020   CREATININE 0.95 09/01/2020   BILITOT 0.6 09/01/2020   ALKPHOS 53 09/01/2020   AST 10 09/01/2020    ALT 12 09/01/2020   PROT 6.1 09/01/2020   ALBUMIN 4.1 09/01/2020   CALCIUM 8.9 09/01/2020   ANIONGAP 4 (L) 09/26/2018   GFR 78.94 09/01/2020   Lab Results  Component Value Date   CHOL 122 09/01/2020   Lab Results  Component Value Date   HDL 50.30 09/01/2020   Lab Results  Component Value Date   LDLCALC 64 09/01/2020   Lab Results  Component Value Date   TRIG 38.0 09/01/2020   Lab Results  Component Value Date   CHOLHDL 2 09/01/2020   Lab Results  Component Value Date   HGBA1C 5.6 09/01/2020       Assessment & Plan:   Problem List Items Addressed This Visit       Unprioritized   Bronchitis - Primary    Finish abx and pred taper  covid and flu neg      Relevant Orders   POC Influenza A&B (Binax test) (Completed)   POC COVID-19 (Completed)   DG Chest 2 View (Completed)   Neb given with albuterol with no relief   Meds ordered this encounter  Medications   methylPREDNISolone acetate (DEPO-MEDROL) injection 80 mg   cefTRIAXone (ROCEPHIN) injection 1 g   albuterol (PROVENTIL) (2.5 MG/3ML) 0.083% nebulizer solution 2.5 mg     I, Dr. Roma Schanz, DO, personally preformed the services described in this documentation.  All medical record entries made by the scribe were at my direction and in my presence.  I have reviewed the chart and discharge instructions (if applicable) and agree that the record reflects my personal performance and is accurate and complete. 02/03/2021   I,Shehryar Baig,acting as a scribe for [name], DO.,have documented all relevant documentation on the behalf of [name], DO,as directed by  [name], DO while in the presence of [name], DO.   [name], DO

## 2021-02-03 NOTE — Patient Instructions (Signed)
Acute Bronchitis, Adult °Acute bronchitis is sudden inflammation of the main airways (bronchi) that come off the windpipe (trachea) in the lungs. The swelling causes the airways to get smaller and make more mucus than normal. This can make it hard to breathe and can cause coughing or noisy breathing (wheezing). °Acute bronchitis may last several weeks. The cough may last longer. Allergies, asthma, and exposure to smoke may make the condition worse. °What are the causes? °This condition can be caused by germs and by substances that irritate the lungs, including: °Cold and flu viruses. The most common cause of this condition is the virus that causes the common cold. °Bacteria. This is less common. °Breathing in substances that irritate the lungs, including: °Smoke from cigarettes and other forms of tobacco. °Dust and pollen. °Fumes from household cleaning products, gases, or burned fuel. °Indoor or outdoor air pollution. °What increases the risk? °The following factors may make you more likely to develop this condition: °A weak body's defense system, also called the immune system. °A condition that affects your lungs and breathing, such as asthma. °What are the signs or symptoms? °Common symptoms of this condition include: °Coughing. This may bring up clear, yellow, or green mucus from your lungs (sputum). °Wheezing. °Runny or stuffy nose. °Having too much mucus in your lungs (chest congestion). °Shortness of breath. °Aches and pains, including sore throat or chest. °How is this diagnosed? °This condition is usually diagnosed based on: °Your symptoms and medical history. °A physical exam. °You may also have other tests, including tests to rule out other conditions, such as pneumonia. These tests include: °A test of lung function. °Test of a mucus sample to look for the presence of bacteria. °Tests to check the oxygen level in your blood. °Blood tests. °Chest X-ray. °How is this treated? °Most cases of acute bronchitis  clear up over time without treatment. Your health care provider may recommend: °Drinking more fluids to help thin your mucus so it is easier to cough up. °Taking inhaled medicine (inhaler) to improve air flow in and out of your lungs. °Using a vaporizer or a humidifier. These are machines that add water to the air to help you breathe better. °Taking a medicine that thins mucus and clears congestion (expectorant). °Taking a medicine that prevents or stops coughing (cough suppressant). °It is notcommon to take an antibiotic medicine for this condition. °Follow these instructions at home: ° °Take over-the-counter and prescription medicines only as told by your health care provider. °Use an inhaler, vaporizer, or humidifier as told by your health care provider. °Take two teaspoons (10 mL) of honey at bedtime to lessen coughing at night. °Drink enough fluid to keep your urine pale yellow. °Do not use any products that contain nicotine or tobacco. These products include cigarettes, chewing tobacco, and vaping devices, such as e-cigarettes. If you need help quitting, ask your health care provider. °Get plenty of rest. °Return to your normal activities as told by your health care provider. Ask your health care provider what activities are safe for you. °Keep all follow-up visits. This is important. °How is this prevented? °To lower your risk of getting this condition again: °Wash your hands often with soap and water for at least 20 seconds. If soap and water are not available, use hand sanitizer. °Avoid contact with people who have cold symptoms. °Try not to touch your mouth, nose, or eyes with your hands. °Avoid breathing in smoke or chemical fumes. Breathing smoke or chemical fumes will make your condition   worse. °Get the flu shot every year. °Contact a health care provider if: °Your symptoms do not improve after 2 weeks. °You have trouble coughing up the mucus. °Your cough keeps you awake at night. °You have a  fever. °Get help right away if you: °Cough up blood. °Feel pain in your chest. °Have severe shortness of breath. °Faint or keep feeling like you are going to faint. °Have a severe headache. °Have a fever or chills that get worse. °These symptoms may represent a serious problem that is an emergency. Do not wait to see if the symptoms will go away. Get medical help right away. Call your local emergency services (911 in the [place] Do not drive yourself to the hospital. °Summary °Acute bronchitis is inflammation of the main airways (bronchi) that come off the windpipe (trachea) in the lungs. The swelling causes the airways to get smaller and make more mucus than normal. °Drinking more fluids can help thin your mucus so it is easier to cough up. °Take over-the-counter and prescription medicines only as told by your health care provider. °Do not use any products that contain nicotine or tobacco. These products include cigarettes, chewing tobacco, and vaping devices, such as e-cigarettes. If you need help quitting, ask your health care provider. °Contact a health care provider if your symptoms do not improve after 2 weeks. °This information is not intended to replace advice given to you by your health care provider. Make sure you discuss any questions you have with your health care provider. °Document Revised: 07/09/2020 Document Reviewed: 07/09/2020 °[employer] Patient Education © 2022 [employer] ° °

## 2021-02-10 ENCOUNTER — Encounter: Payer: Self-pay | Admitting: Family Medicine

## 2021-02-10 ENCOUNTER — Other Ambulatory Visit: Payer: Self-pay

## 2021-02-10 ENCOUNTER — Ambulatory Visit (INDEPENDENT_AMBULATORY_CARE_PROVIDER_SITE_OTHER): Payer: Medicare Other | Admitting: Family Medicine

## 2021-02-10 VITALS — BP 118/70 | HR 53 | Temp 97.8°F | Resp 18 | Ht 68.0 in | Wt 246.8 lb

## 2021-02-10 DIAGNOSIS — J40 Bronchitis, not specified as acute or chronic: Secondary | ICD-10-CM | POA: Diagnosis not present

## 2021-02-10 MED ORDER — FLUTICASONE PROPIONATE HFA 110 MCG/ACT IN AERO: 2.0000 | INHALATION_SPRAY | Freq: Two times a day (BID) | RESPIRATORY_TRACT | 12 refills | Status: DC

## 2021-02-10 NOTE — Progress Notes (Signed)
Subjective:   By signing my name below, I, Shehryar Baig, attest that this documentation has been prepared under the direction and in the presence of Dr. Roma Schanz, DO. 02/10/2021     Patient ID: Eric [name], male    DOB: 11-07-1945, 75 y.o.   MRN: [id number]  Chief Complaint  Patient presents with   Bronchitis   Follow-up    HPI Patient is in today for a follow up for bronchitis.   Breathing- He reports improving in his breathing. He continues having mild wheezing and occasional cough. He denies having any night sweats. His O2 measurement was 98% today. He is interested in trying an inhaler to manage his current symptoms.  CPAP- He reports having an upcomming appointment with a pulmonologist for a CPAP machine evaluation.    Past Medical History:  Diagnosis Date   Alcohol abuse    Allergy    Back pain    Cataract    forming   Colon polyps    Diverticulosis of colon    DJD (degenerative joint disease)    Dysrhythmia    Palpations from caffiene- reports he was having PACs due to slim fast wth caffeine - had full cardiac w/u and was told to cut back on caffeine;  now drink decaf bevrerages and no rpeort of recurrence    Enlarged prostate    GERD (gastroesophageal reflux disease)    Hearing loss    Hip osteoarthritis    Left   History of hay fever    Hx of cardiovascular stress test    Lexiscan Myoview (04/2013):  No ischemia, EF 61%; low risk   Hyperlipemia    Hypertension    Insulin resistance    om metfromin    Internal hemorrhoids without mention of complication    Leg edema    Neuroma of foot    left   Obesity    Osteoarthrosis, unspecified whether generalized or localized, unspecified site    Palpitations    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   Primary localized osteoarthrosis of right shoulder 05/25/2016   Recent weight loss    75lbs   Seasonal allergies    Sleep apnea    CPAP   Thyroid nodule    calcified thyroid nodules-  bx negative    Tremor of both hands     Past Surgical History:  Procedure Laterality Date   COLONOSCOPY     POLYPECTOMY     SHOULDER SURGERY  2009   right   TONSILLECTOMY     TOTAL HIP ARTHROPLASTY Left 03/28/2017   Procedure: TOTAL HIP ARTHROPLASTY ANTERIOR APPROACH;  Surgeon: Frederik Pear, MD;  Location: [place];  Service: Orthopedics;  Laterality: Left;   TOTAL HIP ARTHROPLASTY Right 09/25/2018   Procedure: Right Anterior Hip Arthroplasty;  Surgeon: Frederik Pear, MD;  Location: [facility];  Service: Orthopedics;  Laterality: Right;   TOTAL SHOULDER ARTHROPLASTY Left 10/14/2015   Procedure: LEFT TOTAL SHOULDER ARTHROPLASTY;  Surgeon: Marchia Bond, MD;  Location: [place];  Service: Orthopedics;  Laterality: Left;   TOTAL SHOULDER ARTHROPLASTY Right 05/25/2016   Procedure: TOTAL SHOULDER ARTHROPLASTY;  Surgeon: Marchia Bond, MD;  Location: [place];  Service: Orthopedics;  Laterality: Right;    Family History  Problem Relation Age of Onset   Arthritis Mother    Cancer Mother 72       breast   Hyperlipidemia Mother    Colon polyps Mother 60   Hypertension Mother    Heart disease Mother  Depression Mother    Anxiety disorder Mother    Obesity Mother    Breast cancer Mother    Heart disease Father        CAD--passed away 06-06-2011 age 76   Hyperlipidemia Father    Hypertension Father    Obesity Father    Diabetes Father    Depression Father    Sleep apnea Father    Cancer Sister 36       breast   Breast cancer Sister    Cancer Brother 34       prostate   Prostate cancer Brother    Breast cancer Other    Prostate cancer Other    Colon cancer Neg Hx    Esophageal cancer Neg Hx    Rectal cancer Neg Hx    Stomach cancer Neg Hx     Social History   Socioeconomic History   Marital status: Married    Spouse name: Fermin Yan    Number of children: 3   Years of education: Not on file   Highest education level: Not on file  Occupational History   Occupation: Retired--[employer],  [employer]  Tobacco Use   Smoking status: Never   Smokeless tobacco: Never  Vaping Use   Vaping Use: Never used  Substance and Sexual Activity   Alcohol use: Yes    Alcohol/week: 0.0 standard drinks    Comment: rare----12 a year   Drug use: No   Sexual activity: Yes    Partners: Female  Other Topics Concern   Not on file  Social History Narrative   Lives with wife and daughter and two grandchildren.    Social Determinants of Health   Financial Resource Strain: Not on file  Food Insecurity: Not on file  Transportation Needs: Not on file  Physical Activity: Not on file  Stress: Not on file  Social Connections: Not on file  Intimate Partner Violence: Not on file    Outpatient Medications Prior to Visit  Medication Sig Dispense Refill   albuterol (VENTOLIN HFA) 108 (90 Base) MCG/ACT inhaler Inhale 2 puffs into the lungs every 6 (six) hours as needed for wheezing or shortness of breath. 8 g 0   aspirin EC 81 MG tablet Take 1 tablet (81 mg total) by mouth 2 (two) times daily. (Patient taking differently: Take 81 mg by mouth once.) 60 tablet 0   atorvastatin (LIPITOR) 40 MG tablet TAKE 1 TABLET DAILY 90 tablet 3   benzonatate (TESSALON) 100 MG capsule Take 1 capsule (100 mg total) by mouth 3 (three) times daily as needed. 30 capsule 0   clindamycin (CLEOCIN-T) 1 % lotion Apply topically 2 (two) times daily. 60 mL 2   Coenzyme Q10 (COQ10) 200 MG CAPS Take 200 mg by mouth at bedtime.     fluocinonide ointment (LIDEX) 2.63 % Apply 1 application topically daily as needed (for bites). 30 g 3   fluticasone (FLONASE) 50 MCG/ACT nasal spray Place 2 sprays into both nostrils daily as needed for allergies.      loratadine (CLARITIN) 10 MG tablet Take 10 mg by mouth daily as needed for allergies.      meloxicam (MOBIC) 15 MG tablet TAKE 1 TABLET DAILY 90 tablet 1   metFORMIN (GLUCOPHAGE) 500 MG tablet Take 1 tablet (500 mg total) by mouth daily with breakfast. 30 tablet 0   pantoprazole  (PROTONIX) 40 MG tablet TAKE 1 TABLET DAILY 90 tablet 3   Semaglutide,0.25 or 0.5MG /DOS, (OZEMPIC, 0.25 OR 0.5 MG/DOSE,)  2 MG/1.5ML SOPN Inject 0.5 mg into the skin once a week. 4.5 mL 0   tamsulosin (FLOMAX) 0.4 MG CAPS capsule TAKE 1 CAPSULE DAILY 90 capsule 1   valsartan (DIOVAN) 80 MG tablet TAKE 1 TABLET DAILY 90 tablet 1   Vitamin D, Ergocalciferol, (DRISDOL) 1.25 MG (50000 UNIT) CAPS capsule TAKE ONE CAPSULE BY MOUTH EVERY 7 DAYS 4 capsule 0   Zinc 50 MG CAPS Take by mouth daily.     azithromycin (ZITHROMAX) 250 MG tablet Take 2 tablets PO on day one, and one tablet PO daily thereafter until completed. (Patient not taking: Reported on 02/10/2021) 6 tablet 0   predniSONE (DELTASONE) 20 MG tablet Take 2 tablets (40 mg total) by mouth daily with breakfast. (Patient not taking: Reported on 02/10/2021) 10 tablet 0   No facility-administered medications prior to visit.    Allergies  Allergen Reactions   Lisinopril Cough    Review of Systems  Respiratory:  Positive for cough and wheezing.   Psychiatric/Behavioral:         (-)night sweats      Objective:    Physical Exam Constitutional:      General: He is not in acute distress.    Appearance: Normal appearance. He is not ill-appearing.  HENT:     Head: Normocephalic and atraumatic.     Right Ear: External ear normal.     Left Ear: External ear normal.  Eyes:     Extraocular Movements: Extraocular movements intact.     Pupils: Pupils are equal, round, and reactive to light.  Cardiovascular:     Rate and Rhythm: Normal rate and regular rhythm.     Heart sounds: Normal heart sounds. No murmur heard.   No gallop.  Pulmonary:     Effort: Pulmonary effort is normal.     Breath sounds: Wheezing present.  Skin:    General: Skin is warm and dry.  Neurological:     Mental Status: He is alert and oriented to person, place, and time.  Psychiatric:        Behavior: Behavior normal.        Judgment: Judgment normal.    BP 118/70  (BP Location: Left Arm, Patient Position: Sitting, Cuff Size: Large)   Pulse (!) 53   Temp 97.8 F (36.6 C) (Oral)   Resp 18   Ht 5\' 8"  (1.727 m)   Wt 246 lb 12.8 oz (111.9 kg)   SpO2 98%   BMI 37.53 kg/m  Wt Readings from Last 3 Encounters:  02/10/21 246 lb 12.8 oz (111.9 kg)  02/03/21 246 lb 3.2 oz (111.7 kg)  01/20/21 238 lb (108 kg)    Diabetic Foot Exam - Simple   No data filed    Lab Results  Component Value Date   WBC 5.1 09/01/2020   HGB 14.9 09/01/2020   HCT 44.4 09/01/2020   PLT 178.0 09/01/2020   GLUCOSE 94 09/01/2020   CHOL 122 09/01/2020   TRIG 38.0 09/01/2020   HDL 50.30 09/01/2020   LDLCALC 64 09/01/2020   ALT 12 09/01/2020   AST 10 09/01/2020   NA 140 09/01/2020   K 4.4 09/01/2020   CL 106 09/01/2020   CREATININE 0.95 09/01/2020   BUN 21 09/01/2020   CO2 28 09/01/2020   TSH 1.060 12/14/2017   PSA 1.00 03/03/2020   INR 1.0 09/21/2018   HGBA1C 5.6 09/01/2020   MICROALBUR <0.7 09/01/2020    Lab Results  Component Value Date   TSH  1.060 12/14/2017   Lab Results  Component Value Date   WBC 5.1 09/01/2020   HGB 14.9 09/01/2020   HCT 44.4 09/01/2020   MCV 92.7 09/01/2020   PLT 178.0 09/01/2020   Lab Results  Component Value Date   NA 140 09/01/2020   K 4.4 09/01/2020   CO2 28 09/01/2020   GLUCOSE 94 09/01/2020   BUN 21 09/01/2020   CREATININE 0.95 09/01/2020   BILITOT 0.6 09/01/2020   ALKPHOS 53 09/01/2020   AST 10 09/01/2020   ALT 12 09/01/2020   PROT 6.1 09/01/2020   ALBUMIN 4.1 09/01/2020   CALCIUM 8.9 09/01/2020   ANIONGAP 4 (L) 09/26/2018   GFR 78.94 09/01/2020   Lab Results  Component Value Date   CHOL 122 09/01/2020   Lab Results  Component Value Date   HDL 50.30 09/01/2020   Lab Results  Component Value Date   LDLCALC 64 09/01/2020   Lab Results  Component Value Date   TRIG 38.0 09/01/2020   Lab Results  Component Value Date   CHOLHDL 2 09/01/2020   Lab Results  Component Value Date   HGBA1C 5.6  09/01/2020       Assessment & Plan:   Problem List Items Addressed This Visit       Unprioritized   Bronchitis - Primary    Slowly improving Pt wants to hold off on pulm referral Add inhaler  F/u 1 month or sooner prn       Relevant Medications   fluticasone (FLOVENT HFA) 110 MCG/ACT inhaler     Meds ordered this encounter  Medications   fluticasone (FLOVENT HFA) 110 MCG/ACT inhaler    Sig: Inhale 2 puffs into the lungs 2 (two) times daily.    Dispense:  1 each    Refill:  12    I, Dr. Roma Schanz, DO, personally preformed the services described in this documentation.  All medical record entries made by the scribe were at my direction and in my presence.  I have reviewed the chart and discharge instructions (if applicable) and agree that the record reflects my personal performance and is accurate and complete. 02/10/2021   I,Shehryar Baig,acting as a scribe for [name], DO.,have documented all relevant documentation on the behalf of [name], DO,as directed by  [name], DO while in the presence of [name], DO.   [name], DO

## 2021-02-10 NOTE — Patient Instructions (Signed)
Acute Bronchitis, Adult °Acute bronchitis is sudden inflammation of the main airways (bronchi) that come off the windpipe (trachea) in the lungs. The swelling causes the airways to get smaller and make more mucus than normal. This can make it hard to breathe and can cause coughing or noisy breathing (wheezing). °Acute bronchitis may last several weeks. The cough may last longer. Allergies, asthma, and exposure to smoke may make the condition worse. °What are the causes? °This condition can be caused by germs and by substances that irritate the lungs, including: °Cold and flu viruses. The most common cause of this condition is the virus that causes the common cold. °Bacteria. This is less common. °Breathing in substances that irritate the lungs, including: °Smoke from cigarettes and other forms of tobacco. °Dust and pollen. °Fumes from household cleaning products, gases, or burned fuel. °Indoor or outdoor air pollution. °What increases the risk? °The following factors may make you more likely to develop this condition: °A weak body's defense system, also called the immune system. °A condition that affects your lungs and breathing, such as asthma. °What are the signs or symptoms? °Common symptoms of this condition include: °Coughing. This may bring up clear, yellow, or green mucus from your lungs (sputum). °Wheezing. °Runny or stuffy nose. °Having too much mucus in your lungs (chest congestion). °Shortness of breath. °Aches and pains, including sore throat or chest. °How is this diagnosed? °This condition is usually diagnosed based on: °Your symptoms and medical history. °A physical exam. °You may also have other tests, including tests to rule out other conditions, such as pneumonia. These tests include: °A test of lung function. °Test of a mucus sample to look for the presence of bacteria. °Tests to check the oxygen level in your blood. °Blood tests. °Chest X-ray. °How is this treated? °Most cases of acute bronchitis  clear up over time without treatment. Your health care provider may recommend: °Drinking more fluids to help thin your mucus so it is easier to cough up. °Taking inhaled medicine (inhaler) to improve air flow in and out of your lungs. °Using a vaporizer or a humidifier. These are machines that add water to the air to help you breathe better. °Taking a medicine that thins mucus and clears congestion (expectorant). °Taking a medicine that prevents or stops coughing (cough suppressant). °It is notcommon to take an antibiotic medicine for this condition. °Follow these instructions at home: ° °Take over-the-counter and prescription medicines only as told by your health care provider. °Use an inhaler, vaporizer, or humidifier as told by your health care provider. °Take two teaspoons (10 mL) of honey at bedtime to lessen coughing at night. °Drink enough fluid to keep your urine pale yellow. °Do not use any products that contain nicotine or tobacco. These products include cigarettes, chewing tobacco, and vaping devices, such as e-cigarettes. If you need help quitting, ask your health care provider. °Get plenty of rest. °Return to your normal activities as told by your health care provider. Ask your health care provider what activities are safe for you. °Keep all follow-up visits. This is important. °How is this prevented? °To lower your risk of getting this condition again: °Wash your hands often with soap and water for at least 20 seconds. If soap and water are not available, use hand sanitizer. °Avoid contact with people who have cold symptoms. °Try not to touch your mouth, nose, or eyes with your hands. °Avoid breathing in smoke or chemical fumes. Breathing smoke or chemical fumes will make your condition   worse. °Get the flu shot every year. °Contact a health care provider if: °Your symptoms do not improve after 2 weeks. °You have trouble coughing up the mucus. °Your cough keeps you awake at night. °You have a  fever. °Get help right away if you: °Cough up blood. °Feel pain in your chest. °Have severe shortness of breath. °Faint or keep feeling like you are going to faint. °Have a severe headache. °Have a fever or chills that get worse. °These symptoms may represent a serious problem that is an emergency. Do not wait to see if the symptoms will go away. Get medical help right away. Call your local emergency services (911 in the [place] Do not drive yourself to the hospital. °Summary °Acute bronchitis is inflammation of the main airways (bronchi) that come off the windpipe (trachea) in the lungs. The swelling causes the airways to get smaller and make more mucus than normal. °Drinking more fluids can help thin your mucus so it is easier to cough up. °Take over-the-counter and prescription medicines only as told by your health care provider. °Do not use any products that contain nicotine or tobacco. These products include cigarettes, chewing tobacco, and vaping devices, such as e-cigarettes. If you need help quitting, ask your health care provider. °Contact a health care provider if your symptoms do not improve after 2 weeks. °This information is not intended to replace advice given to you by your health care provider. Make sure you discuss any questions you have with your health care provider. °Document Revised: 07/09/2020 Document Reviewed: 07/09/2020 °[employer] Patient Education © 2022 [employer] ° °

## 2021-02-10 NOTE — Assessment & Plan Note (Signed)
Slowly improving Pt wants to hold off on pulm referral Add inhaler  F/u 1 month or sooner prn

## 2021-02-17 DIAGNOSIS — L814 Other melanin hyperpigmentation: Secondary | ICD-10-CM | POA: Diagnosis not present

## 2021-02-17 DIAGNOSIS — D225 Melanocytic nevi of trunk: Secondary | ICD-10-CM | POA: Diagnosis not present

## 2021-02-17 DIAGNOSIS — D1801 Hemangioma of skin and subcutaneous tissue: Secondary | ICD-10-CM | POA: Diagnosis not present

## 2021-02-17 DIAGNOSIS — X32XXXS Exposure to sunlight, sequela: Secondary | ICD-10-CM | POA: Diagnosis not present

## 2021-02-17 DIAGNOSIS — L738 Other specified follicular disorders: Secondary | ICD-10-CM | POA: Diagnosis not present

## 2021-02-17 DIAGNOSIS — L570 Actinic keratosis: Secondary | ICD-10-CM | POA: Diagnosis not present

## 2021-02-17 DIAGNOSIS — D2272 Melanocytic nevi of left lower limb, including hip: Secondary | ICD-10-CM | POA: Diagnosis not present

## 2021-02-24 ENCOUNTER — Encounter (INDEPENDENT_AMBULATORY_CARE_PROVIDER_SITE_OTHER): Payer: Self-pay | Admitting: Family Medicine

## 2021-02-24 ENCOUNTER — Other Ambulatory Visit: Payer: Self-pay

## 2021-02-24 ENCOUNTER — Ambulatory Visit (INDEPENDENT_AMBULATORY_CARE_PROVIDER_SITE_OTHER): Payer: Medicare Other | Admitting: Family Medicine

## 2021-02-24 VITALS — BP 108/66 | HR 53 | Temp 98.2°F | Ht 68.0 in | Wt 238.0 lb

## 2021-02-24 DIAGNOSIS — E7849 Other hyperlipidemia: Secondary | ICD-10-CM

## 2021-02-24 DIAGNOSIS — E559 Vitamin D deficiency, unspecified: Secondary | ICD-10-CM | POA: Diagnosis not present

## 2021-02-24 DIAGNOSIS — E1165 Type 2 diabetes mellitus with hyperglycemia: Secondary | ICD-10-CM | POA: Diagnosis not present

## 2021-02-24 DIAGNOSIS — Z6841 Body Mass Index (BMI) 40.0 and over, adult: Secondary | ICD-10-CM | POA: Diagnosis not present

## 2021-02-24 MED ORDER — METFORMIN HCL 500 MG PO TABS: 500.0000 mg | ORAL_TABLET | Freq: Every day | ORAL | 0 refills | Status: DC

## 2021-02-24 MED ORDER — VITAMIN D (ERGOCALCIFEROL) 1.25 MG (50000 UNIT) PO CAPS: ORAL_CAPSULE | ORAL | 0 refills | Status: DC

## 2021-02-24 NOTE — Progress Notes (Signed)
Chief Complaint:   OBESITY Eric [name] is here to discuss his progress with his obesity treatment plan along with follow-up of his obesity related diagnoses. Eric [name] is on the Category 3 Plan and states he is following his eating plan approximately 30% of the time. Eric [name] states he is doing 0 minutes 0 times per week.  Today's visit was #: 51 Starting weight: 288 lbs Starting date: 12/14/2017 Today's weight: 238 lbs Today's date: 02/24/2021 Total lbs lost to date: 50 Total lbs lost since last in-office visit: 0  Interim History: Eric [name] has done well maintaining his weight over Thanksgiving. He had bronchitis, and he had been on steroids and antibiotics. He is feeling much better now and he has gotten back on track.  Subjective:   1. Type 2 diabetes mellitus with hyperglycemia, without long-term current use of insulin (HCC) Eric [name] is on Ozempic and he has done well minimizing simple carbohydrates.  2. Vitamin D deficiency Eric [name] is stable on Vit D, and he denies nausea, vomiting, or muscle weakness.  3. Other hyperlipidemia Eric [name] is on Crestor and CoQ10. He is working on diet, and he denies chest pain or myalgias.  Assessment/Plan:   1. Type 2 diabetes mellitus with hyperglycemia, without long-term current use of insulin (HCC) Eric [name] will continue Ozempic at 0.5 mg, and we will refill metformin for 1 month. Good blood sugar control is important to decrease the likelihood of diabetic complications such as nephropathy, neuropathy, limb loss, blindness, coronary artery disease, and death. Intensive lifestyle modification including diet, exercise and weight loss are the first line of treatment for diabetes.   - metFORMIN (GLUCOPHAGE) 500 MG tablet; Take 1 tablet (500 mg total) by mouth daily with breakfast.  Dispense: 30 tablet; Refill: 0  2. Vitamin D deficiency We will refill prescription Vitamin D for 1 month. Eric [name] will follow-up for routine testing of Vitamin D, at least  2-3 times per year to avoid over-replacement.  - Vitamin D, Ergocalciferol, (DRISDOL) 1.25 MG (50000 UNIT) CAPS capsule; TAKE ONE CAPSULE BY MOUTH EVERY 7 DAYS  Dispense: 4 capsule; Refill: 0  3. Other hyperlipidemia Eric [name] will continue Crestor and CoQ10, and he will continue to work on diet, exercise and weight loss efforts. Orders and follow up as documented in patient record.   4. Obesity BMI today is 67 Eric [name] is currently in the action stage of change. As such, his goal is to continue with weight loss efforts. He has agreed to the Category 3 Plan.   Behavioral modification strategies: no skipping meals and holiday eating strategies .  Eric [name] has agreed to follow-up with our clinic in 4 weeks. He was informed of the importance of frequent follow-up visits to maximize his success with intensive lifestyle modifications for his multiple health conditions.   Objective:   Blood pressure 108/66, pulse (!) 53, temperature 98.2 F (36.8 C), height 5\' 8"  (1.727 m), weight 238 lb (108 kg), SpO2 97 %. Body mass index is 36.19 kg/m.  General: Cooperative, alert, well developed, in no acute distress. HEENT: Conjunctivae and lids unremarkable. Cardiovascular: Regular rhythm.  Lungs: Normal work of breathing. Neurologic: No focal deficits.   Lab Results  Component Value Date   CREATININE 0.95 09/01/2020   BUN 21 09/01/2020   NA 140 09/01/2020   K 4.4 09/01/2020   CL 106 09/01/2020   CO2 28 09/01/2020   Lab Results  Component Value Date   ALT 12 09/01/2020   AST 10 09/01/2020   ALKPHOS 53 09/01/2020  BILITOT 0.6 09/01/2020   Lab Results  Component Value Date   HGBA1C 5.6 09/01/2020   HGBA1C 5.4 03/03/2020   HGBA1C 5.6 10/22/2019   HGBA1C 5.3 06/18/2019   HGBA1C 5.5 03/02/2019   Lab Results  Component Value Date   INSULIN 9.0 10/22/2019   INSULIN 21.3 12/14/2017   Lab Results  Component Value Date   TSH 1.060 12/14/2017   Lab Results  Component Value Date   CHOL  122 09/01/2020   HDL 50.30 09/01/2020   LDLCALC 64 09/01/2020   TRIG 38.0 09/01/2020   CHOLHDL 2 09/01/2020   Lab Results  Component Value Date   VD25OH 44.50 09/01/2020   VD25OH 49.51 03/03/2020   VD25OH 55.6 10/22/2019   Lab Results  Component Value Date   WBC 5.1 09/01/2020   HGB 14.9 09/01/2020   HCT 44.4 09/01/2020   MCV 92.7 09/01/2020   PLT 178.0 09/01/2020   No results found for: IRON, TIBC, FERRITIN  Obesity Behavioral Intervention:   Approximately 15 minutes were spent on the discussion below.  ASK: We discussed the diagnosis of obesity with Eric [name] today and Eric [name] agreed to give [place] permission to discuss obesity behavioral modification therapy today.  ASSESS: Eric [name] has the diagnosis of obesity and his BMI today is 36.2. Eric [name] is in the action stage of change.   ADVISE: Eric [name] was educated on the multiple health risks of obesity as well as the benefit of weight loss to improve his health. He was advised of the need for long term treatment and the importance of lifestyle modifications to improve his current health and to decrease his risk of future health problems.  AGREE: Multiple dietary modification options and treatment options were discussed and Eric [name] agreed to follow the recommendations documented in the above note.  ARRANGE: Eric [name] was educated on the importance of frequent visits to treat obesity as outlined per CMS and USPSTF guidelines and agreed to schedule his next follow up appointment today.  Attestation Statements:   Reviewed by clinician on day of visit: allergies, medications, problem list, medical history, surgical history, family history, social history, and previous encounter notes.   I, Trixie Dredge, am acting as transcriptionist for [name], MD.  I have reviewed the above documentation for accuracy and completeness, and I agree with the above. -  [name], MD

## 2021-03-03 ENCOUNTER — Encounter: Payer: Self-pay | Admitting: Neurology

## 2021-03-03 ENCOUNTER — Ambulatory Visit (INDEPENDENT_AMBULATORY_CARE_PROVIDER_SITE_OTHER): Payer: Medicare Other | Admitting: Neurology

## 2021-03-03 VITALS — BP 119/75 | HR 55 | Ht 69.0 in | Wt 250.8 lb

## 2021-03-03 DIAGNOSIS — R634 Abnormal weight loss: Secondary | ICD-10-CM | POA: Diagnosis not present

## 2021-03-03 DIAGNOSIS — E669 Obesity, unspecified: Secondary | ICD-10-CM | POA: Diagnosis not present

## 2021-03-03 DIAGNOSIS — G4733 Obstructive sleep apnea (adult) (pediatric): Secondary | ICD-10-CM

## 2021-03-03 DIAGNOSIS — Z9989 Dependence on other enabling machines and devices: Secondary | ICD-10-CM | POA: Diagnosis not present

## 2021-03-03 DIAGNOSIS — G4719 Other hypersomnia: Secondary | ICD-10-CM | POA: Diagnosis not present

## 2021-03-03 NOTE — Progress Notes (Signed)
Subjective:    Patient ID: Eric [name] is a 75 y.o. male.  HPI    Star Age, MD, PhD [facility] [address], [address] [place], [place] [address]  Dear Dr. Carollee Herter,   I saw your patient, Eric [name], upon your kind request in my sleep clinic today for evaluation of his sleep disorder.,  In particular, evaluation of his prior diagnosis of obstructive sleep apnea.  The patient is unaccompanied today.  I have previously evaluated him for his longstanding history of hand tremors.  As you know, [name] he is a 75 year old right-handed gentleman with an complex medical history of hypertension, hyperlipidemia, insulin resistance, osteoarthritis, palpitations, allergies, and tremors, chronic low back pain, BPH, vitamin D deficiency, edema, and obesity, who was previously diagnosed with obstructive sleep apnea and placed on PAP therapy.  I was able to review a sleep study result from 12/08/2007.  Study was conducted at the [facility] sleep center.  This was a CPAP titration study, he was titrated from a pressure of 4 cm to 15 cm.  I was also able to review a result from a baseline polysomnogram conducted at the same center located in [place], [place], study date of 11/19/2007.  Sleep latency was 7.4 minutes, REM latency 90.9 minutes, sleep efficiency 94.4%.  He achieved very little slow-wave sleep and REM sleep was 9.5%, average oxygen saturation was 96.4%, desaturation nadir was 79%.  Total AHI was 15.8/h.  He has an older CPAP machine.  He is in need for a replacement.  His Epworth sleepiness score is 11 out of 24, fatigue severity score is 29 out of 63.  In fact, his previous PAP stopped working and he has a [employer] since 12/10/2020.  He is fully compliant with treatment and I was able to review compliance data from the last nearly 3 months.  Average AHI 0.9/h, at goal, average pressure for the 95th percentile at 10.5  cm, patient on the default range of AutoPap from 5 to 20 cm.  Leak acceptable, actually on the low side, with the 95th percentile at 3.6 L/min.  He reports that his original CPAP was set to 15 cm and then he received an AutoPap machine after that, this is his third machine essentially but he needs a replacement because this is a [employer].  When he started treatment for sleep apnea he had significant improvement in his snoring, nocturia and daytime energy.  He has been working on weight loss and goes to the weight management clinic for the past 3 years.  As far as his hand tremor, his father had a tremor and paternal family history is positive for tremors.  He feels that his tremor is perhaps a little bit better since he stopped Wellbutrin.  His DME company is [employer].  Previously:   04/24/20: 75 year old right-handed gentleman with an underlying medical history of diverticulosis, hypertension, hyperlipidemia, insulin resistance, vitamin D deficiency, edema, osteoarthritis, seasonal allergies, sleep apnea (uses CPAP), back pain, and obesity, who reports a longstanding history of approximately 10+ years maybe 12+ years of bilateral hand tremors.  His tremor has become worse over time.  Recently, when he started Wellbutrin, he felt that the tremor became worse so he stopped the Wellbutrin but it did not really affect the tremors.  He restarted the Wellbutrin and did not notice a significant change.  He has noticed that caffeine affects his tremor.  His tremor is primarily noticeable when he  holds something or uses his hands, sometimes he can suppress the tremor but not for long.  It is bilateral and he has also started noticing a quivering in his jaw and almost like a teeth chattering.  He recalls that his paternal grandfather had a severe hand tremor.  His father also had a tremor but he did not see his father very often.  Father died of congestive heart failure.  His dad lived to be 70 years old.  I reviewed  your office note from 02/05/2020.  Patient has a sister and 1 brother.  He is the youngest of all 3.  His sister has Lewy body dementia and brother has prostate cancer, neither 1 have a significant or similar tremor.  He has 3 grown children, 2 sons and 1 daughter, as far as he knows, neither 1 has a tremor.  He is somewhat bothered by the tremor but not severely.  He drinks decaf coffee, about 2 cups/day and peach tea several servings per day.  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  He lives with his wife.  His Past Medical History Is Significant For: Past Medical History:  Diagnosis Date   Alcohol abuse    Allergy    Back pain    Cataract    forming   Colon polyps    Diverticulosis of colon    DJD (degenerative joint disease)    Dysrhythmia    Palpations from caffiene- reports he was having PACs due to slim fast wth caffeine - had full cardiac w/u and was told to cut back on caffeine;  now drink decaf bevrerages and no rpeort of recurrence    Enlarged prostate    GERD (gastroesophageal reflux disease)    Hearing loss    Hip osteoarthritis    Left   History of hay fever    Hx of cardiovascular stress test    Lexiscan Myoview (04/2013):  No ischemia, EF 61%; low risk   Hyperlipemia    Hypertension    Insulin resistance    om metfromin    Internal hemorrhoids without mention of complication    Leg edema    Neuroma of foot    left   Obesity    Osteoarthrosis, unspecified whether generalized or localized, unspecified site    Palpitations    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   Primary localized osteoarthrosis of right shoulder 05/25/2016   Recent weight loss    75lbs   Seasonal allergies    Sleep apnea    CPAP   Thyroid nodule    calcified thyroid nodules- bx negative    Tremor of both hands     His Past Surgical History Is Significant For: Past Surgical History:  Procedure Laterality Date   COLONOSCOPY     POLYPECTOMY     SHOULDER SURGERY  2009   right    TONSILLECTOMY     TOTAL HIP ARTHROPLASTY Left 03/28/2017   Procedure: TOTAL HIP ARTHROPLASTY ANTERIOR APPROACH;  Surgeon: Frederik Pear, MD;  Location: [facility];  Service: Orthopedics;  Laterality: Left;   TOTAL HIP ARTHROPLASTY Right 09/25/2018   Procedure: Right Anterior Hip Arthroplasty;  Surgeon: Frederik Pear, MD;  Location: [facility];  Service: Orthopedics;  Laterality: Right;   TOTAL SHOULDER ARTHROPLASTY Left 10/14/2015   Procedure: LEFT TOTAL SHOULDER ARTHROPLASTY;  Surgeon: Marchia Bond, MD;  Location: [facility];  Service: Orthopedics;  Laterality: Left;   TOTAL SHOULDER ARTHROPLASTY Right 05/25/2016   Procedure: TOTAL SHOULDER ARTHROPLASTY;  Surgeon: Marchia Bond, MD;  Location: [facility];  Service: Orthopedics;  Laterality: Right;    His Family History Is Significant For: Family History  Problem Relation Age of Onset   Arthritis Mother    Cancer Mother 69       breast   Hyperlipidemia Mother    Colon polyps Mother 48   Hypertension Mother    Heart disease Mother    Depression Mother    Anxiety disorder Mother    Obesity Mother    Breast cancer Mother    Heart disease Father        CAD--passed away 06/11/2011 age 39   Hyperlipidemia Father    Hypertension Father    Obesity Father    Diabetes Father    Depression Father    Sleep apnea Father    Cancer Sister 79       breast   Breast cancer Sister    Sleep apnea Sister    Cancer Brother 10       prostate   Prostate cancer Brother    Breast cancer Other    Prostate cancer Other    Colon cancer Neg Hx    Esophageal cancer Neg Hx    Rectal cancer Neg Hx    Stomach cancer Neg Hx     His Social History Is Significant For: Social History   Socioeconomic History   Marital status: Married    Spouse name: Lizandro Spellman    Number of children: 3   Years of education: Not on file   Highest education level: Not on file  Occupational History   Occupation: Retired--[employer]  Tobacco Use   Smoking status: Never   Smokeless  tobacco: Never  Vaping Use   Vaping Use: Never used  Substance and Sexual Activity   Alcohol use: Not Currently    Alcohol/week: 1.0 standard drink    Types: 1 Glasses of wine per week    Comment: rare----12 a year   Drug use: No   Sexual activity: Yes    Partners: Female  Other Topics Concern   Not on file  Social History Narrative   Lives with wife and daughter and two grandchildren.    Social Determinants of Health   Financial Resource Strain: Not on file  Food Insecurity: Not on file  Transportation Needs: Not on file  Physical Activity: Not on file  Stress: Not on file  Social Connections: Not on file    His Allergies Are:  Allergies  Allergen Reactions   Lisinopril Cough  :   His Current Medications Are:  Outpatient Encounter Medications as of 03/03/2021  Medication Sig   albuterol (VENTOLIN HFA) 108 (90 Base) MCG/ACT inhaler Inhale 2 puffs into the lungs every 6 (six) hours as needed for wheezing or shortness of breath.   aspirin EC 81 MG tablet Take 1 tablet (81 mg total) by mouth 2 (two) times daily. (Patient taking differently: Take 81 mg by mouth once.)   atorvastatin (LIPITOR) 40 MG tablet TAKE 1 TABLET DAILY   clindamycin (CLEOCIN-T) 1 % lotion Apply topically 2 (two) times daily.   Coenzyme Q10 (COQ10) 200 MG CAPS Take 200 mg by mouth at bedtime.   fluocinonide ointment (LIDEX) 2.42 % Apply 1 application topically daily as needed (for bites).   fluticasone (FLONASE) 50 MCG/ACT nasal spray Place 2 sprays into both nostrils daily as needed for allergies.    fluticasone (FLOVENT HFA) 110 MCG/ACT inhaler Inhale 2 puffs into the lungs 2 (  two) times daily.   loratadine (CLARITIN) 10 MG tablet Take 10 mg by mouth daily as needed for allergies.    meloxicam (MOBIC) 15 MG tablet TAKE 1 TABLET DAILY   metFORMIN (GLUCOPHAGE) 500 MG tablet Take 1 tablet (500 mg total) by mouth daily with breakfast.   pantoprazole (PROTONIX) 40 MG tablet TAKE 1 TABLET DAILY    Semaglutide,0.25 or 0.5MG /DOS, (OZEMPIC, 0.25 OR 0.5 MG/DOSE,) 2 MG/1.5ML SOPN Inject 0.5 mg into the skin once a week.   tamsulosin (FLOMAX) 0.4 MG CAPS capsule TAKE 1 CAPSULE DAILY   valsartan (DIOVAN) 80 MG tablet TAKE 1 TABLET DAILY   Vitamin D, Ergocalciferol, (DRISDOL) 1.25 MG (50000 UNIT) CAPS capsule TAKE ONE CAPSULE BY MOUTH EVERY 7 DAYS   Zinc 50 MG CAPS Take by mouth daily.   benzonatate (TESSALON) 100 MG capsule Take 1 capsule (100 mg total) by mouth 3 (three) times daily as needed.   No facility-administered encounter medications on file as of 03/03/2021.  :   Review of Systems:  Out of a complete 14 point review of systems, all are reviewed and negative with the exception of these symptoms as listed below: Review of Systems  Neurological:        Pt is here  for sleep consult . Pt states his PCP wanted him to see a sleep specialist . Pt states that he needs a new machine because he has a rental right now .  ESS:11 FSS :29    Objective:  Neurological Exam  Physical Exam Physical Examination:   Vitals:   03/03/21 1250  BP: 119/75  Pulse: (!) 55    General Examination: The patient is a very pleasant 75 y.o. male in no acute distress. He appears well-developed and well-nourished and well groomed.   HEENT: Normocephalic, atraumatic, pupils are equal, round and reactive to light, face is well-preserved, hearing is grossly intact, has bilateral hearing aids. Face is symmetric with normal facial animation. Speech is clear with no dysarthria noted. There is no hypophonia. There is an intermittent mild lower jaw tremor. Neck is supple with full range of passive and active motion. There are no carotid bruits on auscultation. Oropharynx exam reveals: mild mouth dryness, good dental hygiene and moderate airway crowding. Tongue protrudes centrally and palate elevates symmetrically.    Chest: Clear to auscultation without wheezing, rhonchi or crackles noted.   Heart: S1+S2+0,  regular and normal without murmurs, rubs or gallops noted.  Mild bradycardia noted.   Abdomen: Soft, non-tender and non-distended.   Extremities: There is no pitting edema in the distal lower extremities bilaterally.    Skin: Warm and dry without trophic changes noted.   Musculoskeletal: exam reveals no obvious joint deformities, status post bilateral hip replacements as well as shoulder surgeries.    Neurologically:  Mental status: The patient is awake, alert and oriented in all 4 spheres. His immediate and remote memory, attention, language skills and fund of knowledge are appropriate. There is no evidence of aphasia, agnosia, apraxia or anomia. Speech is clear with normal prosody and enunciation. Thought process is linear. Mood is normal and affect is normal.  Cranial nerves II - XII are as described above under HEENT exam. Motor exam: Normal bulk, strength and tone is noted. There is no drift, or resting tremor.      (On 04/24/2020: On Archimedes spiral drawing he has insecurity with the left hand, no significant trembling noted with the right or left hand, handwriting is legible, not particularly tremulous and not  micrographic.)     He has a slight bilateral upper extremity postural and action tremor, no significant intention tremor, no resting tremor.   Fine motor skills and coordination: Grossly intact.  Cerebellar testing: No dysmetria or intention tremor. There is no truncal or gait ataxia.  Sensory exam: intact to light touch in the upper and lower extremities.  Gait, station and balance: He stands without difficulty, he has a slightly asymmetrical hip height.  He walks without shuffling, has preserved arm swing.   Assessment and Plan:   In summary, KASHMERE STAFFA is a very pleasant 75 year old male with an underlying medical history of diverticulosis, hypertension, hyperlipidemia, insulin resistance, vitamin D deficiency, edema, osteoarthritis, seasonal allergies, sleep apnea  (uses CPAP), back pain, tremors disorder and obesity, who presents for evaluation of his diagnosis of obstructive sleep apnea.  He has been on CPAP therapy or AutoPap for several years, essentially since 2009.  He has originally used CPAP therapy and then had an AutoPap machine which recently broke.  He is currently on an loaner AutoPap machine and is fully compliant with it.  He has benefited from treatment over the years and is highly motivated to continue with it.  He needs new equipment.  Since testing was over 10 years ago, we will proceed with a home sleep test.  He is agreeable to pursuing this for reassessing his sleep apnea.  He has had some weight fluctuation, including fairly consistent weight loss over the recent past.  We will plan to follow-up after testing.  Once he gets a new AutoPap machine, he will need a follow-up for insurance compliance within 1 to 3 months.  He is encouraged to continue with his current loaner machine.   He does have an appointment pending for follow-up for his tremor next year, he is encouraged to keep this as well.  I answered all his questions today and he was in agreement.  Thank you very much for allowing me to participate in the care of this nice patient. If I can be of any further assistance to you please do not hesitate to call me at [phone].   Sincerely,     Star Age, MD, PhD

## 2021-03-03 NOTE — Patient Instructions (Signed)
It was nice to see you again today.  I am glad you have been able to use a loaner machine for your sleep apnea.  You are fully compliant with treatment and your apnea is under good control.  We will proceed with a home sleep test to reassess your sleep apnea and to update your diagnoses, I will be able to prescribe a new machine hopefully soon after your home sleep test.  Once you start treatment with your new machine, you will need to follow-up for compliance check per insurance requirement within the first 3 months of treatment.  We will call you to schedule your home sleep test soon.

## 2021-03-05 ENCOUNTER — Encounter: Payer: Self-pay | Admitting: Family Medicine

## 2021-03-05 ENCOUNTER — Ambulatory Visit: Payer: Medicare Other | Attending: Internal Medicine

## 2021-03-05 ENCOUNTER — Other Ambulatory Visit (HOSPITAL_BASED_OUTPATIENT_CLINIC_OR_DEPARTMENT_OTHER): Payer: Self-pay

## 2021-03-05 ENCOUNTER — Encounter (INDEPENDENT_AMBULATORY_CARE_PROVIDER_SITE_OTHER): Payer: Self-pay | Admitting: Family Medicine

## 2021-03-05 ENCOUNTER — Ambulatory Visit (INDEPENDENT_AMBULATORY_CARE_PROVIDER_SITE_OTHER): Payer: Medicare Other | Admitting: Family Medicine

## 2021-03-05 VITALS — BP 118/70 | HR 53 | Temp 98.6°F | Resp 18 | Ht 69.0 in | Wt 248.8 lb

## 2021-03-05 DIAGNOSIS — R7303 Prediabetes: Secondary | ICD-10-CM

## 2021-03-05 DIAGNOSIS — R3915 Urgency of urination: Secondary | ICD-10-CM | POA: Diagnosis not present

## 2021-03-05 DIAGNOSIS — E559 Vitamin D deficiency, unspecified: Secondary | ICD-10-CM | POA: Diagnosis not present

## 2021-03-05 DIAGNOSIS — Z23 Encounter for immunization: Secondary | ICD-10-CM

## 2021-03-05 DIAGNOSIS — I10 Essential (primary) hypertension: Secondary | ICD-10-CM

## 2021-03-05 DIAGNOSIS — E785 Hyperlipidemia, unspecified: Secondary | ICD-10-CM | POA: Diagnosis not present

## 2021-03-05 LAB — CBC WITH DIFFERENTIAL/PLATELET
Basophils Absolute: 0 10*3/uL (ref 0.0–0.1)
Basophils Relative: 0.4 % (ref 0.0–3.0)
Eosinophils Absolute: 0.1 10*3/uL (ref 0.0–0.7)
Eosinophils Relative: 1.6 % (ref 0.0–5.0)
HCT: 44.2 % (ref 39.0–52.0)
Hemoglobin: 14.7 g/dL (ref 13.0–17.0)
Lymphocytes Relative: 24.3 % (ref 12.0–46.0)
Lymphs Abs: 1.3 10*3/uL (ref 0.7–4.0)
MCHC: 33.3 g/dL (ref 30.0–36.0)
MCV: 92.3 fl (ref 78.0–100.0)
Monocytes Absolute: 0.7 10*3/uL (ref 0.1–1.0)
Monocytes Relative: 12.2 % — ABNORMAL HIGH (ref 3.0–12.0)
Neutro Abs: 3.4 10*3/uL (ref 1.4–7.7)
Neutrophils Relative %: 61.5 % (ref 43.0–77.0)
Platelets: 163 10*3/uL (ref 150.0–400.0)
RBC: 4.79 Mil/uL (ref 4.22–5.81)
RDW: 14.1 % (ref 11.5–15.5)
WBC: 5.4 10*3/uL (ref 4.0–10.5)

## 2021-03-05 LAB — COMPREHENSIVE METABOLIC PANEL
ALT: 22 U/L (ref 0–53)
AST: 13 U/L (ref 0–37)
Albumin: 3.9 g/dL (ref 3.5–5.2)
Alkaline Phosphatase: 51 U/L (ref 39–117)
BUN: 21 mg/dL (ref 6–23)
CO2: 29 mEq/L (ref 19–32)
Calcium: 9 mg/dL (ref 8.4–10.5)
Chloride: 104 mEq/L (ref 96–112)
Creatinine, Ser: 1.02 mg/dL (ref 0.40–1.50)
GFR: 72.22 mL/min (ref >60.00)
Glucose, Bld: 84 mg/dL (ref 70–99)
Potassium: 4.3 mEq/L (ref 3.5–5.1)
Sodium: 139 mEq/L (ref 135–145)
Total Bilirubin: 0.8 mg/dL (ref 0.2–1.2)
Total Protein: 5.9 g/dL — ABNORMAL LOW (ref 6.0–8.3)

## 2021-03-05 LAB — LIPID PANEL
Cholesterol: 115 mg/dL (ref 0–200)
HDL: 49.3 mg/dL (ref >39.00)
LDL Cholesterol: 55 mg/dL (ref 0–99)
NonHDL: 65.59
Total CHOL/HDL Ratio: 2
Triglycerides: 53 mg/dL (ref 0.0–149.0)
VLDL: 10.6 mg/dL (ref 0.0–40.0)

## 2021-03-05 LAB — PSA: PSA: 1.22 ng/mL (ref 0.10–4.00)

## 2021-03-05 LAB — HEMOGLOBIN A1C: Hgb A1c MFr Bld: 5.7 % (ref 4.6–6.5)

## 2021-03-05 LAB — VITAMIN D 25 HYDROXY (VIT D DEFICIENCY, FRACTURES): VITD: 43.17 ng/mL (ref 30.00–100.00)

## 2021-03-05 MED ORDER — PFIZER COVID-19 VAC BIVALENT 30 MCG/0.3ML IM SUSP
INTRAMUSCULAR | 0 refills | Status: DC
  Filled 2021-03-05: qty 0.3, 1d supply, fill #0

## 2021-03-05 NOTE — Progress Notes (Signed)
Subjective:   By signing my name below, I, Eric [name], attest that this documentation has been prepared under the direction and in the presence of Eric Held, DO. 03/05/2021   Patient ID: Eric [name], male    DOB: [birth year], 75 y.o.   MRN: [id number]  Chief Complaint  Patient presents with   Hypertension   Hyperlipidemia   Follow-up    Hypertension Pertinent negatives include no blurred vision, chest pain, headaches, palpitations or shortness of breath.  Hyperlipidemia Pertinent negatives include no chest pain, myalgias or shortness of breath.  Patient is in today for an office visit.  He reports a decrease in his wheezing and has been feeling better.  He reports still recovering from Covid-19 and has been feeling more tired recently. He has not been engaging in any outdoor activities because of the weather.   He saw a neurologist to evaluate his sleep apnea and will need another sleep study test conducted. He also sees her for his tremors.   He sees his urologist next year.  He is using steroid shots but mentions he gets heart burn after and is using Protonix to manage it. He thinks it is still manageable.   His blood pressure is stable at today's visit. BP Readings from Last 3 Encounters:  03/05/21 118/70  03/03/21 119/75  02/24/21 108/66    He is still seeing [facility] and is doing well with them. He has been maintaining a steady weight but has not able to follow his diet due to a recent illness.  He reports he gets some leg swelling when he goes on trips but they are resolved overnight.    Past Medical History:  Diagnosis Date   Alcohol abuse    Allergy    Back pain    Cataract    forming   Colon polyps    Diverticulosis of colon    DJD (degenerative joint disease)    Dysrhythmia    Palpations from caffiene- reports he was having PACs due to slim fast wth caffeine - had full cardiac w/u and was told to cut back on caffeine;   now drink decaf bevrerages and no rpeort of recurrence    Enlarged prostate    GERD (gastroesophageal reflux disease)    Hearing loss    Hip osteoarthritis    Left   History of hay fever    Hx of cardiovascular stress test    Lexiscan Myoview (04/2013):  No ischemia, EF 61%; low risk   Hyperlipemia    Hypertension    Insulin resistance    om metfromin    Internal hemorrhoids without mention of complication    Leg edema    Neuroma of foot    left   Obesity    Osteoarthrosis, unspecified whether generalized or localized, unspecified site    Palpitations    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   Primary localized osteoarthrosis of right shoulder 05/25/2016   Recent weight loss    75lbs   Seasonal allergies    Sleep apnea    CPAP   Thyroid nodule    calcified thyroid nodules- bx negative    Tremor of both hands     Past Surgical History:  Procedure Laterality Date   COLONOSCOPY     POLYPECTOMY     SHOULDER SURGERY  2009   right   TONSILLECTOMY     TOTAL HIP ARTHROPLASTY Left 03/28/2017   Procedure: TOTAL HIP ARTHROPLASTY ANTERIOR  APPROACH;  Surgeon: Frederik Pear, MD;  Location: [facility];  Service: Orthopedics;  Laterality: Left;   TOTAL HIP ARTHROPLASTY Right 09/25/2018   Procedure: Right Anterior Hip Arthroplasty;  Surgeon: Frederik Pear, MD;  Location: [facility];  Service: Orthopedics;  Laterality: Right;   TOTAL SHOULDER ARTHROPLASTY Left 10/14/2015   Procedure: LEFT TOTAL SHOULDER ARTHROPLASTY;  Surgeon: Marchia Bond, MD;  Location: [facility];  Service: Orthopedics;  Laterality: Left;   TOTAL SHOULDER ARTHROPLASTY Right 05/25/2016   Procedure: TOTAL SHOULDER ARTHROPLASTY;  Surgeon: Marchia Bond, MD;  Location: [facility];  Service: Orthopedics;  Laterality: Right;    Family History  Problem Relation Age of Onset   Arthritis Mother    Cancer Mother 55       breast   Hyperlipidemia Mother    Colon polyps Mother 52   Hypertension Mother    Heart disease Mother     Depression Mother    Anxiety disorder Mother    Obesity Mother    Breast cancer Mother    Heart disease Father        CAD--passed away 2011-06-15 age 31   Hyperlipidemia Father    Hypertension Father    Obesity Father    Diabetes Father    Depression Father    Sleep apnea Father    Cancer Sister 61       breast   Breast cancer Sister    Sleep apnea Sister    Cancer Brother 30       prostate   Prostate cancer Brother    Breast cancer Other    Prostate cancer Other    Colon cancer Neg Hx    Esophageal cancer Neg Hx    Rectal cancer Neg Hx    Stomach cancer Neg Hx     Social History   Socioeconomic History   Marital status: Married    Spouse name: Eric [name]    Number of children: 3   Years of education: Not on file   Highest education level: Not on file  Occupational History   Occupation: Retired--[employer]  Tobacco Use   Smoking status: Never   Smokeless tobacco: Never  Vaping Use   Vaping Use: Never used  Substance and Sexual Activity   Alcohol use: Not Currently    Alcohol/week: 1.0 standard drink    Types: 1 Glasses of wine per week    Comment: rare----12 a year   Drug use: No   Sexual activity: Yes    Partners: Female  Other Topics Concern   Not on file  Social History Narrative   Lives with wife and daughter and two grandchildren.    Social Determinants of Health   Financial Resource Strain: Not on file  Food Insecurity: Not on file  Transportation Needs: Not on file  Physical Activity: Not on file  Stress: Not on file  Social Connections: Not on file  Intimate Partner Violence: Not on file    Outpatient Medications Prior to Visit  Medication Sig Dispense Refill   albuterol (VENTOLIN HFA) 108 (90 Base) MCG/ACT inhaler Inhale 2 puffs into the lungs every 6 (six) hours as needed for wheezing or shortness of breath. 8 g 0   aspirin EC 81 MG tablet Take 1 tablet (81 mg total) by mouth 2 (two) times daily. (Patient taking differently:  Take 81 mg by mouth once.) 60 tablet 0   atorvastatin (LIPITOR) 40 MG tablet TAKE 1 TABLET DAILY 90 tablet 3   clindamycin (CLEOCIN-T) 1 %  lotion Apply topically 2 (two) times daily. 60 mL 2   Coenzyme Q10 (COQ10) 200 MG CAPS Take 200 mg by mouth at bedtime.     fluocinonide ointment (LIDEX) 4.40 % Apply 1 application topically daily as needed (for bites). 30 g 3   fluticasone (FLONASE) 50 MCG/ACT nasal spray Place 2 sprays into both nostrils daily as needed for allergies.      fluticasone (FLOVENT HFA) 110 MCG/ACT inhaler Inhale 2 puffs into the lungs 2 (two) times daily. 1 each 12   loratadine (CLARITIN) 10 MG tablet Take 10 mg by mouth daily as needed for allergies.      meloxicam (MOBIC) 15 MG tablet TAKE 1 TABLET DAILY 90 tablet 1   metFORMIN (GLUCOPHAGE) 500 MG tablet Take 1 tablet (500 mg total) by mouth daily with breakfast. 30 tablet 0   pantoprazole (PROTONIX) 40 MG tablet TAKE 1 TABLET DAILY 90 tablet 3   Semaglutide,0.25 or 0.5MG /DOS, (OZEMPIC, 0.25 OR 0.5 MG/DOSE,) 2 MG/1.5ML SOPN Inject 0.5 mg into the skin once a week. 4.5 mL 0   tamsulosin (FLOMAX) 0.4 MG CAPS capsule TAKE 1 CAPSULE DAILY 90 capsule 1   valsartan (DIOVAN) 80 MG tablet TAKE 1 TABLET DAILY 90 tablet 1   Vitamin D, Ergocalciferol, (DRISDOL) 1.25 MG (50000 UNIT) CAPS capsule TAKE ONE CAPSULE BY MOUTH EVERY 7 DAYS 4 capsule 0   Zinc 50 MG CAPS Take by mouth daily.     benzonatate (TESSALON) 100 MG capsule Take 1 capsule (100 mg total) by mouth 3 (three) times daily as needed. 30 capsule 0   No facility-administered medications prior to visit.    Allergies  Allergen Reactions   Lisinopril Cough    Review of Systems  Constitutional:  Negative for fever.  HENT:  Negative for congestion, ear pain, hearing loss, sinus pain and sore throat.   Eyes:  Negative for blurred vision and pain.  Respiratory:  Negative for cough, sputum production, shortness of breath and wheezing.   Cardiovascular:  Negative for chest  pain and palpitations.  Gastrointestinal:  Positive for heartburn. Negative for blood in stool, constipation, diarrhea, nausea and vomiting.  Genitourinary:  Negative for dysuria, frequency, hematuria and urgency.  Musculoskeletal:  Negative for back pain, falls and myalgias.  Neurological:  Negative for dizziness, sensory change, loss of consciousness, weakness and headaches.  Endo/Heme/Allergies:  Negative for environmental allergies. Does not bruise/bleed easily.  Psychiatric/Behavioral:  Negative for depression and suicidal ideas. The patient is not nervous/anxious and does not have insomnia.       Objective:    Physical Exam Constitutional:      General: He is not in acute distress.    Appearance: Normal appearance. He is not ill-appearing.  HENT:     Head: Normocephalic and atraumatic.     Right Ear: Tympanic membrane, ear canal and external ear normal.     Left Ear: Tympanic membrane, ear canal and external ear normal.  Eyes:     Pupils: Pupils are equal, round, and reactive to light.  Cardiovascular:     Rate and Rhythm: Normal rate and regular rhythm.     Pulses: Normal pulses.     Heart sounds: No murmur heard.   No gallop.  Pulmonary:     Effort: Pulmonary effort is normal. No respiratory distress.     Breath sounds: Normal breath sounds. No wheezing or rhonchi.  Abdominal:     General: Bowel sounds are normal. There is no distension.     Palpations:  Abdomen is soft.     Tenderness: There is no abdominal tenderness. There is no guarding.     Hernia: No hernia is present.  Musculoskeletal:     Cervical back: Neck supple.  Lymphadenopathy:     Cervical: No cervical adenopathy.  Skin:    General: Skin is warm and dry.  Neurological:     Mental Status: He is alert and oriented to person, place, and time.    BP 118/70 (BP Location: Left Arm, Patient Position: Sitting, Cuff Size: Normal)    Pulse (!) 53    Temp 98.6 F (37 C) (Oral)    Resp 18    Ht 5\' 9"  (1.753 m)     Wt 248 lb 12.8 oz (112.9 kg)    SpO2 95%    BMI 36.74 kg/m  Wt Readings from Last 3 Encounters:  03/05/21 248 lb 12.8 oz (112.9 kg)  03/03/21 250 lb 12.8 oz (113.8 kg)  02/24/21 238 lb (108 kg)    Diabetic Foot Exam - Simple   No data filed    Lab Results  Component Value Date   WBC 5.1 09/01/2020   HGB 14.9 09/01/2020   HCT 44.4 09/01/2020   PLT 178.0 09/01/2020   GLUCOSE 94 09/01/2020   CHOL 122 09/01/2020   TRIG 38.0 09/01/2020   HDL 50.30 09/01/2020   LDLCALC 64 09/01/2020   ALT 12 09/01/2020   AST 10 09/01/2020   NA 140 09/01/2020   K 4.4 09/01/2020   CL 106 09/01/2020   CREATININE 0.95 09/01/2020   BUN 21 09/01/2020   CO2 28 09/01/2020   TSH 1.060 12/14/2017   PSA 1.00 03/03/2020   INR 1.0 09/21/2018   HGBA1C 5.6 09/01/2020   MICROALBUR <0.7 09/01/2020    Lab Results  Component Value Date   TSH 1.060 12/14/2017   Lab Results  Component Value Date   WBC 5.1 09/01/2020   HGB 14.9 09/01/2020   HCT 44.4 09/01/2020   MCV 92.7 09/01/2020   PLT 178.0 09/01/2020   Lab Results  Component Value Date   NA 140 09/01/2020   K 4.4 09/01/2020   CO2 28 09/01/2020   GLUCOSE 94 09/01/2020   BUN 21 09/01/2020   CREATININE 0.95 09/01/2020   BILITOT 0.6 09/01/2020   ALKPHOS 53 09/01/2020   AST 10 09/01/2020   ALT 12 09/01/2020   PROT 6.1 09/01/2020   ALBUMIN 4.1 09/01/2020   CALCIUM 8.9 09/01/2020   ANIONGAP 4 (L) 09/26/2018   GFR 78.94 09/01/2020   Lab Results  Component Value Date   CHOL 122 09/01/2020   Lab Results  Component Value Date   HDL 50.30 09/01/2020   Lab Results  Component Value Date   LDLCALC 64 09/01/2020   Lab Results  Component Value Date   TRIG 38.0 09/01/2020   Lab Results  Component Value Date   CHOLHDL 2 09/01/2020   Lab Results  Component Value Date   HGBA1C 5.6 09/01/2020       Assessment & Plan:   Problem List Items Addressed This Visit       Unprioritized   Urgency of urination   Relevant Orders   PSA    Vitamin D deficiency   Relevant Orders   VITAMIN D 25 Hydroxy (Vit-D Deficiency, Fractures)   Prediabetes   Relevant Orders   CBC with Differential/Platelet   Comprehensive metabolic panel   Lipid panel   Hemoglobin A1c   VITAMIN D 25 Hydroxy (Vit-D Deficiency, Fractures)  Insulin, random   Morbid obesity ([place])   Relevant Orders   CBC with Differential/Platelet   Comprehensive metabolic panel   Lipid panel   Hemoglobin A1c   VITAMIN D 25 Hydroxy (Vit-D Deficiency, Fractures)   Insulin, random   Essential hypertension    Well controlled, no changes to meds. Encouraged heart healthy diet such as the DASH diet and exercise as tolerated.       Hyperlipidemia    Tolerating statin, encouraged heart healthy diet, avoid trans fats, minimize simple carbs and saturated fats. Increase exercise as tolerated      Relevant Orders   Comprehensive metabolic panel   Lipid panel   Other Visit Diagnoses     Primary hypertension    -  Primary   Relevant Orders   Comprehensive metabolic panel       No orders of the defined types were placed in this encounter.   I,Eric [name],acting as a Education administrator for [employer], DO.,have documented all relevant documentation on the behalf of Eric Held, DO,as directed by  Eric Held, DO while in the presence of [place], DO. , personally preformed the services described in this documentation.  All medical record entries made by the scribe were at my direction and in my presence.  I have reviewed the chart and discharge instructions (if applicable) and agree that the record reflects my personal performance and is accurate and complete. 03/05/2021

## 2021-03-05 NOTE — Assessment & Plan Note (Signed)
Tolerating statin, encouraged heart healthy diet, avoid trans fats, minimize simple carbs and saturated fats. Increase exercise as tolerated 

## 2021-03-05 NOTE — Assessment & Plan Note (Signed)
Well controlled, no changes to meds. Encouraged heart healthy diet such as the DASH diet and exercise as tolerated.  °

## 2021-03-05 NOTE — Progress Notes (Signed)
° °  Covid-19 Vaccination Clinic  Name:  Eric [name]    MRN: [id number] DOB: [birth year]  03/05/2021  [name] was observed post Covid-19 immunization for 15 minutes without incident. He was provided with Vaccine Information Sheet and instruction to access the V-Safe system.   [name] was instructed to call 911 with any severe reactions post vaccine: Difficulty breathing  Swelling of face and throat  A fast heartbeat  A bad rash all over body  Dizziness and weakness   Immunizations Administered     Name Date Dose VIS Date Route   Pfizer Covid-19 Vaccine Bivalent Booster 03/05/2021  9:23 AM 0.3 mL 11/19/2020 Intramuscular   Manufacturer: [place]   Lot: [id number]   [place]: [phone]

## 2021-03-05 NOTE — Patient Instructions (Signed)

## 2021-03-06 LAB — INSULIN, RANDOM: Insulin: 10.4 u[IU]/mL

## 2021-03-09 NOTE — Telephone Encounter (Signed)
Please see message and advise.  Thank you. ° °

## 2021-03-12 ENCOUNTER — Ambulatory Visit: Payer: TRICARE For Life (TFL) | Admitting: Family Medicine

## 2021-03-17 ENCOUNTER — Ambulatory Visit (INDEPENDENT_AMBULATORY_CARE_PROVIDER_SITE_OTHER): Payer: Medicare Other | Admitting: Neurology

## 2021-03-17 DIAGNOSIS — G4719 Other hypersomnia: Secondary | ICD-10-CM

## 2021-03-17 DIAGNOSIS — E669 Obesity, unspecified: Secondary | ICD-10-CM

## 2021-03-17 DIAGNOSIS — G4733 Obstructive sleep apnea (adult) (pediatric): Secondary | ICD-10-CM | POA: Diagnosis not present

## 2021-03-17 DIAGNOSIS — Z9989 Dependence on other enabling machines and devices: Secondary | ICD-10-CM | POA: Diagnosis not present

## 2021-03-17 DIAGNOSIS — R634 Abnormal weight loss: Secondary | ICD-10-CM

## 2021-03-19 NOTE — Progress Notes (Signed)
° °  [facility]  HOME SLEEP TEST (Watch PAT) REPORT  STUDY DATE: 03/17/2021  DOB: 01/29/1946  MRN: [id number]  ORDERING CLINICIAN: Star Age, MD, PhD   REFERRING CLINICIAN: Carollee Herter, Alferd Apa, DO   CLINICAL INFORMATION/HISTORY: 75 year old right-handed gentleman with an complex medical history of hypertension, hyperlipidemia, insulin resistance, osteoarthritis, palpitations, allergies, and tremors, chronic low back pain, BPH, vitamin D deficiency, edema, and obesity, who was previously diagnosed with obstructive sleep apnea and placed on PAP therapy.  His PAP machine recently broke and he is in need of a replacement.  He has with PAP therapy.  Epworth sleepiness score: 11/24.  BMI: 37.2 kg/m  FINDINGS:   Sleep Summary:   Total Recording Time (hours, min): 8 hours, 37 minutes  Total Sleep Time (hours, min):  7 hours, 52 minutes   Percent REM (%):    12.3%   Respiratory Indices:   Calculated pAHI (per hour):  19.2/hour         REM pAHI:    10.5/hour       NREM pAHI: 20.4/hour  Oxygen Saturation Statistics:    Oxygen Saturation (%) Mean: 93%   Minimum oxygen saturation (%):                 86%   O2 Saturation Range (%): 86-97%    O2 Saturation (minutes) <=88%: 0.4 min  Pulse Rate Statistics:   Pulse Mean (bpm):    53/min    Pulse Range (41-84/min)   IMPRESSION: OSA (obstructive sleep apnea)   RECOMMENDATION:  This home sleep test demonstrates moderate obstructive sleep apnea with a total AHI of 19.2/hour and O2 nadir of 86%.  Mild to moderate snoring was detected.  Ongoing treatment with positive airway pressure is recommended.  I will write for a new AutoPap machine.  A full night titration study may be considered to optimize treatment settings, if needed down the road. Please note that untreated obstructive sleep apnea may carry additional perioperative morbidity. Patients with significant obstructive sleep apnea should receive  perioperative PAP therapy and the surgeons and particularly the anesthesiologist should be informed of the diagnosis and the severity of the sleep disordered breathing.  Concomitant weight loss is recommended. The patient should be cautioned not to drive, work at heights, or operate dangerous or heavy equipment when tired or sleepy. Review and reiteration of good sleep hygiene measures should be pursued with any patient. Other causes of the patient's symptoms, including circadian rhythm disturbances, an underlying mood disorder, medication effect and/or an underlying medical problem cannot be ruled out based on this test. Clinical correlation is recommended. The patient and his referring provider will be notified of the test results. The patient will be seen in follow up in sleep clinic at [facility].  I certify that I have reviewed the raw data recording prior to the issuance of this report in accordance with the standards of the American Academy of Sleep Medicine (AASM).  INTERPRETING PHYSICIAN:   Star Age, MD, PhD  Board Certified in Neurology and Sleep Medicine  [facility] [address], [place],  [address] [phone]

## 2021-03-24 NOTE — Telephone Encounter (Signed)
Please see message . Thank you .

## 2021-03-24 NOTE — Addendum Note (Signed)
Addended by: Star Age on: 03/24/2021 12:58 PM   Modules accepted: Orders

## 2021-03-24 NOTE — Procedures (Signed)
° °  [facility]  HOME SLEEP TEST (Watch PAT) REPORT  STUDY DATE: 03/17/2021  DOB: 03-14-1946  MRN: [id number]  ORDERING CLINICIAN: Star Age, MD, PhD   REFERRING CLINICIAN: Carollee Herter, Alferd Apa, DO   CLINICAL INFORMATION/HISTORY: 76 year old right-handed gentleman with an complex medical history of hypertension, hyperlipidemia, insulin resistance, osteoarthritis, palpitations, allergies, and tremors, chronic low back pain, BPH, vitamin D deficiency, edema, and obesity, who was previously diagnosed with obstructive sleep apnea and placed on PAP therapy.  His PAP machine recently broke and he is in need of a replacement.  He has with PAP therapy.  Epworth sleepiness score: 11/24.  BMI: 37.2 kg/m  FINDINGS:   Sleep Summary:   Total Recording Time (hours, min): 8 hours, 37 minutes  Total Sleep Time (hours, min):  7 hours, 52 minutes   Percent REM (%):    12.3%   Respiratory Indices:   Calculated pAHI (per hour):  19.2/hour         REM pAHI:    10.5/hour       NREM pAHI: 20.4/hour  Oxygen Saturation Statistics:    Oxygen Saturation (%) Mean: 93%   Minimum oxygen saturation (%):                 86%   O2 Saturation Range (%): 86-97%    O2 Saturation (minutes) <=88%: 0.4 min  Pulse Rate Statistics:   Pulse Mean (bpm):    53/min    Pulse Range (41-84/min)   IMPRESSION: OSA (obstructive sleep apnea)   RECOMMENDATION:  This home sleep test demonstrates moderate obstructive sleep apnea with a total AHI of 19.2/hour and O2 nadir of 86%.  Mild to moderate snoring was detected.  Ongoing treatment with positive airway pressure is recommended.  I will write for a new AutoPap machine.  A full night titration study may be considered to optimize treatment settings, if needed down the road. Please note that untreated obstructive sleep apnea may carry additional perioperative morbidity. Patients with significant obstructive sleep apnea should receive  perioperative PAP therapy and the surgeons and particularly the anesthesiologist should be informed of the diagnosis and the severity of the sleep disordered breathing.  Concomitant weight loss is recommended. The patient should be cautioned not to drive, work at heights, or operate dangerous or heavy equipment when tired or sleepy. Review and reiteration of good sleep hygiene measures should be pursued with any patient. Other causes of the patient's symptoms, including circadian rhythm disturbances, an underlying mood disorder, medication effect and/or an underlying medical problem cannot be ruled out based on this test. Clinical correlation is recommended. The patient and his referring provider will be notified of the test results. The patient will be seen in follow up in sleep clinic at [facility].  I certify that I have reviewed the raw data recording prior to the issuance of this report in accordance with the standards of the American Academy of Sleep Medicine (AASM).  INTERPRETING PHYSICIAN:   Star Age, MD, PhD  Board Certified in Neurology and Sleep Medicine  [facility] [address] [place], [place] [address] [phone]

## 2021-03-26 ENCOUNTER — Telehealth: Payer: Self-pay

## 2021-03-26 NOTE — Telephone Encounter (Signed)
-----   Message from Star Age, MD sent at 03/24/2021 12:58 PM EST ----- Patient referred by his primary care physician for reevaluation of his OSA.  He was seen on 03/03/2021 and had a home sleep test on 03/17/2021.    Please call and notify the patient that the recent home sleep test showed obstructive sleep apnea in the moderate range. I would like to write for a new AutoPap machine.  His original machine broke and he has a Designer, multimedia as I understand.  He should have a current DME company to process the order. I have placed an order in the chart. Please send the order, talk to patient, send report to referring MD. We will need a FU in sleep clinic for 10 weeks post-PAP set up, please arrange that with me or one of our NPs. Also reinforce the need for compliance with treatment. Thanks,   Star Age, MD, PhD [facility])

## 2021-03-26 NOTE — Telephone Encounter (Signed)
I called pt. No answer, left a message asking pt to call me back.   

## 2021-03-26 NOTE — Telephone Encounter (Signed)
I called pt. I advised pt that Dr. Rexene Alberts reviewed their sleep study results and found that pt has moderate osa. Dr. Rexene Alberts recommends that pt start on an autopap machine for treatment. I reviewed PAP compliance expectations with the pt. Pt is agreeable to starting an auto-PAP. I advised pt that an order will be sent to a DME, [facility], and [facility] will call the pt within about one week after they file with the pt's insurance. [facility] will show the pt how to use the machine, fit for masks, and troubleshoot the auto-PAP if needed. A follow up appt was made for insurance purposes with Dr. Rexene Alberts on 06/30/21 at 945 am. Pt verbalized understanding to arrive 15 minutes early and bring their auto-PAP. A letter with all of this information in it will be mailed to the pt as a reminder. I verified with the pt that the address we have on file is correct. Pt verbalized understanding of results. Pt had no questions at this time but was encouraged to call back if questions arise. I have sent the order to [facility] and have received confirmation that they have received the order.

## 2021-03-30 ENCOUNTER — Encounter (INDEPENDENT_AMBULATORY_CARE_PROVIDER_SITE_OTHER): Payer: Self-pay | Admitting: Family Medicine

## 2021-03-30 ENCOUNTER — Ambulatory Visit (INDEPENDENT_AMBULATORY_CARE_PROVIDER_SITE_OTHER): Payer: Medicare Other | Admitting: Family Medicine

## 2021-03-30 ENCOUNTER — Other Ambulatory Visit: Payer: Self-pay

## 2021-03-30 VITALS — BP 119/78 | HR 76 | Temp 98.1°F | Ht 69.0 in | Wt 243.0 lb

## 2021-03-30 DIAGNOSIS — R7303 Prediabetes: Secondary | ICD-10-CM | POA: Diagnosis not present

## 2021-03-30 DIAGNOSIS — E559 Vitamin D deficiency, unspecified: Secondary | ICD-10-CM | POA: Diagnosis not present

## 2021-03-30 DIAGNOSIS — Z6836 Body mass index (BMI) 36.0-36.9, adult: Secondary | ICD-10-CM | POA: Diagnosis not present

## 2021-03-30 DIAGNOSIS — E1165 Type 2 diabetes mellitus with hyperglycemia: Secondary | ICD-10-CM

## 2021-03-30 MED ORDER — VITAMIN D (ERGOCALCIFEROL) 1.25 MG (50000 UNIT) PO CAPS: ORAL_CAPSULE | ORAL | 0 refills | Status: DC

## 2021-03-30 MED ORDER — METFORMIN HCL 500 MG PO TABS: 500.0000 mg | ORAL_TABLET | Freq: Every day | ORAL | 0 refills | Status: DC

## 2021-03-31 NOTE — Progress Notes (Signed)
Chief Complaint:   OBESITY Eric [name] is here to discuss his progress with his obesity treatment plan along with follow-up of his obesity related diagnoses. Eric [name] is on the Category 3 Plan and states he is following his eating plan approximately 30% of the time. Eric [name] states he is active while walking and doing yard work.  Today's visit was #: 61 Starting weight: 288 lbs Starting date: 12/14/2017 Today's weight: 243 lbs Today's date: 03/30/2021 Total lbs lost to date: 45 Total lbs lost since last in-office visit: 0  Interim History: Eric [name] did some celebration eating over the holidays, and he is working on getting back on track with his eating plan. He will be doing some traveling which always makes eating healthy more difficult.  Subjective:   1. Pre-diabetes Eric [name] is stable on metformin with no side effects noted. He is working on decreasing simple carbohydrates and weight loss again.  2. Vitamin D deficiency Eric [name] is stable on Vit D, and he denies nausea, vomiting, or muscle weakness. He requests a refill today.  Assessment/Plan:   1. Pre-diabetes Eric [name] will continue to work on weight loss, exercise, and decreasing simple carbohydrates to help decrease the risk of diabetes. We will refill metformin for 1 month.  - metFORMIN (GLUCOPHAGE) 500 MG tablet; Take 1 tablet (500 mg total) by mouth daily with breakfast.  Dispense: 30 tablet; Refill: 0  2. Vitamin D deficiency We will refill prescription Vitamin D for 1 month. Eric [name] will follow-up for routine testing of Vitamin D, at least 2-3 times per year to avoid over-replacement.  - Vitamin D, Ergocalciferol, (DRISDOL) 1.25 MG (50000 UNIT) CAPS capsule; TAKE ONE CAPSULE BY MOUTH EVERY 7 DAYS  Dispense: 4 capsule; Refill: 0  3. Obesity BMI today is 36.9 Eric [name] is currently in the action stage of change. As such, his goal is to continue with weight loss efforts. He has agreed to the Category 3 Plan.   Exercise goals:  As is.  Behavioral modification strategies: increasing lean protein intake, decreasing simple carbohydrates, and travel eating strategies.  Eric [name] has agreed to follow-up with our clinic in 4 weeks. He was informed of the importance of frequent follow-up visits to maximize his success with intensive lifestyle modifications for his multiple health conditions.   Objective:   Blood pressure 119/78, pulse 76, temperature 98.1 F (36.7 C), height 5\' 9"  (1.753 m), weight 243 lb (110.2 kg), SpO2 95 %. Body mass index is 35.88 kg/m.  General: Cooperative, alert, well developed, in no acute distress. HEENT: Conjunctivae and lids unremarkable. Cardiovascular: Regular rhythm.  Lungs: Normal work of breathing. Neurologic: No focal deficits.   Lab Results  Component Value Date   CREATININE 1.02 03/05/2021   BUN 21 03/05/2021   NA 139 03/05/2021   K 4.3 03/05/2021   CL 104 03/05/2021   CO2 29 03/05/2021   Lab Results  Component Value Date   ALT 22 03/05/2021   AST 13 03/05/2021   ALKPHOS 51 03/05/2021   BILITOT 0.8 03/05/2021   Lab Results  Component Value Date   HGBA1C 5.7 03/05/2021   HGBA1C 5.6 09/01/2020   HGBA1C 5.4 03/03/2020   HGBA1C 5.6 10/22/2019   HGBA1C 5.3 06/18/2019   Lab Results  Component Value Date   INSULIN 9.0 10/22/2019   INSULIN 21.3 12/14/2017   Lab Results  Component Value Date   TSH 1.060 12/14/2017   Lab Results  Component Value Date   CHOL 115 03/05/2021   HDL 49.30 03/05/2021  LDLCALC 55 03/05/2021   TRIG 53.0 03/05/2021   CHOLHDL 2 03/05/2021   Lab Results  Component Value Date   VD25OH 43.17 03/05/2021   VD25OH 44.50 09/01/2020   VD25OH 49.51 03/03/2020   Lab Results  Component Value Date   WBC 5.4 03/05/2021   HGB 14.7 03/05/2021   HCT 44.2 03/05/2021   MCV 92.3 03/05/2021   PLT 163.0 03/05/2021   No results found for: IRON, TIBC, FERRITIN  Obesity Behavioral Intervention:   Approximately 15 minutes were spent on the  discussion below.  ASK: We discussed the diagnosis of obesity with Eric [name] today and Eric [name] agreed to give [place] permission to discuss obesity behavioral modification therapy today.  ASSESS: Eric [name] has the diagnosis of obesity and his BMI today is 36.9. Eric [name] is in the action stage of change.   ADVISE: Eric [name] was educated on the multiple health risks of obesity as well as the benefit of weight loss to improve his health. He was advised of the need for long term treatment and the importance of lifestyle modifications to improve his current health and to decrease his risk of future health problems.  AGREE: Multiple dietary modification options and treatment options were discussed and Eric [name] agreed to follow the recommendations documented in the above note.  ARRANGE: Eric [name] was educated on the importance of frequent visits to treat obesity as outlined per CMS and USPSTF guidelines and agreed to schedule his next follow up appointment today.  Attestation Statements:   Reviewed by clinician on day of visit: allergies, medications, problem list, medical history, surgical history, family history, social history, and previous encounter notes.   I, Trixie Dredge, am acting as transcriptionist for [name], MD.  I have reviewed the above documentation for accuracy and completeness, and I agree with the above. -  [name], MD

## 2021-04-07 NOTE — Telephone Encounter (Signed)
Pt sent my chart message today asking for order to be transferred to [place] due to [employer] office closing.  I have done so and sent message to the pt advising on this.

## 2021-04-10 DIAGNOSIS — N401 Enlarged prostate with lower urinary tract symptoms: Secondary | ICD-10-CM | POA: Diagnosis not present

## 2021-04-10 DIAGNOSIS — R350 Frequency of micturition: Secondary | ICD-10-CM | POA: Diagnosis not present

## 2021-04-10 DIAGNOSIS — R3915 Urgency of urination: Secondary | ICD-10-CM | POA: Diagnosis not present

## 2021-04-16 ENCOUNTER — Other Ambulatory Visit (INDEPENDENT_AMBULATORY_CARE_PROVIDER_SITE_OTHER): Payer: Self-pay | Admitting: Family Medicine

## 2021-04-16 DIAGNOSIS — E1165 Type 2 diabetes mellitus with hyperglycemia: Secondary | ICD-10-CM

## 2021-04-16 NOTE — Telephone Encounter (Signed)
LAST APPOINTMENT DATE: 03/30/21 NEXT APPOINTMENT DATE: 04/29/21   [facility] - [place], [place] - [address] [place] [address] [place] [address] Phone: [phone] Fax: [phone]  [facility] [place], [place] [address] Phone: [phone] Fax: [phone]  Patient is requesting a refill of the following medications: Requested Prescriptions   Pending Prescriptions Disp Refills   OZEMPIC, 0.25 OR 0.5 MG/DOSE, 2 MG/1.5ML SOPN [Pharmacy Med Name: OZEMPIC 0.25 OR 0.5 MG/DOSE PEN INJ 2MG /1.5ML] 4.5 mL 3    Sig: INJECT 0.5 MG UNDER THE SKIN ONCE A WEEK    Date last filled: 01/20/21 Previously prescribed by Dr. Leafy Ro  Lab Results  Component Value Date   HGBA1C 5.7 03/05/2021   HGBA1C 5.6 09/01/2020   HGBA1C 5.4 03/03/2020   Lab Results  Component Value Date   MICROALBUR <0.7 09/01/2020   LDLCALC 55 03/05/2021   CREATININE 1.02 03/05/2021   Lab Results  Component Value Date   VD25OH 43.17 03/05/2021   VD25OH 44.50 09/01/2020   VD25OH 49.51 03/03/2020    BP Readings from Last 3 Encounters:  03/30/21 119/78  03/05/21 118/70  03/03/21 119/75

## 2021-04-21 NOTE — Telephone Encounter (Signed)
Ok to send only one month worth as Dr. Leafy Ro may want to change his dose next visit in a week. Thanks

## 2021-04-23 ENCOUNTER — Other Ambulatory Visit: Payer: Self-pay | Admitting: *Deleted

## 2021-04-23 DIAGNOSIS — G4733 Obstructive sleep apnea (adult) (pediatric): Secondary | ICD-10-CM

## 2021-04-27 ENCOUNTER — Ambulatory Visit (INDEPENDENT_AMBULATORY_CARE_PROVIDER_SITE_OTHER): Payer: Medicare Other | Admitting: Neurology

## 2021-04-27 ENCOUNTER — Encounter: Payer: Self-pay | Admitting: Neurology

## 2021-04-27 ENCOUNTER — Telehealth: Payer: Self-pay | Admitting: *Deleted

## 2021-04-27 ENCOUNTER — Other Ambulatory Visit: Payer: Self-pay

## 2021-04-27 VITALS — BP 110/71 | HR 64 | Ht 68.0 in | Wt 257.4 lb

## 2021-04-27 DIAGNOSIS — G250 Essential tremor: Secondary | ICD-10-CM

## 2021-04-27 DIAGNOSIS — G4733 Obstructive sleep apnea (adult) (pediatric): Secondary | ICD-10-CM | POA: Diagnosis not present

## 2021-04-27 NOTE — Telephone Encounter (Signed)
I called [facility].  Spoke to [employer].  She stated they get resmed machines 1-2 months 6-9 machines per box. They have order and it is marked as urgent.  She will try to process this week, and then after that to have pt see RT thereafter when slot available.   She could not tell me when they would received another shipment.   They just received order and all those machines were allocated to others.

## 2021-04-27 NOTE — Progress Notes (Signed)
Subjective:    Patient ID: Eric [name] is a 76 y.o. male.  HPI    Interim history:   Eric [name] he is a 76 year old right-handed gentleman with an complex medical history of hypertension, hyperlipidemia, insulin resistance, osteoarthritis, palpitations, allergies, and tremors, chronic low back pain, BPH, sleep apnea, vitamin D deficiency, edema, and obesity, who presents for consultation of his essential tremor.  He presents for his regular checkup.  I recently saw him for his sleep apnea, related to a home sleep test in the interim on 03/17/2021 which showed evidence of moderate obstructive sleep apnea with an AHI of 19.2/h, O2 nadir 86% with mild to moderate snoring detected.  I wrote for a new Pap machine.  Today, 04/27/21: He reports feeling stable with regards to his tremor, sometimes he has teeth chattering which is annoying.  Hand tremor is fairly stable, noticed primarily when he tries to pour liquids or carry a cup or hold his wife's hand while at church.  He has noticed a change in his balance over time.  He has not fallen recently.  He has had significant low back pain with status post injections, trial of dry needling, status post physical therapy.  Nerve root ablation was discussed but he was not deemed a good candidate for it.  He has seen neurosurgery as well as sports medicine and physical therapy.  He tries to hydrate well.  He tries to limit caffeine. Has not received a new PAP machine yet.  We will contact his DME company.  He still uses his loaner machine.  Previously:   03/03/21: (He) was previously diagnosed with obstructive sleep apnea and placed on PAP therapy.  I was able to review a sleep study result from 12/08/2007.  Study was conducted at the [facility] sleep center.  This was a CPAP titration study, he was titrated from a pressure of 4 cm to 15 cm.  I was also able to review a result from a baseline polysomnogram conducted at the same center located  in [place], [place], study date of 11/19/2007.  Sleep latency was 7.4 minutes, REM latency 90.9 minutes, sleep efficiency 94.4%.  He achieved very little slow-wave sleep and REM sleep was 9.5%, average oxygen saturation was 96.4%, desaturation nadir was 79%.  Total AHI was 15.8/h.  He has an older CPAP machine.  He is in need for a replacement.  His Epworth sleepiness score is 11 out of 24, fatigue severity score is 29 out of 63.  In fact, his previous PAP stopped working and he has a [employer] since 12/10/2020.  He is fully compliant with treatment and I was able to review compliance data from the last nearly 3 months.  Average AHI 0.9/h, at goal, average pressure for the 95th percentile at 10.5 cm, patient on the default range of AutoPap from 5 to 20 cm.  Leak acceptable, actually on the low side, with the 95th percentile at 3.6 L/min.  He reports that his original CPAP was set to 15 cm and then he received an AutoPap machine after that, this is his third machine essentially but he needs a replacement because this is a [employer].  When he started treatment for sleep apnea he had significant improvement in his snoring, nocturia and daytime energy.  He has been working on weight loss and goes to the weight management clinic for the past 3 years.  As far as his hand tremor, his father had a tremor and paternal  family history is positive for tremors.  He feels that his tremor is perhaps a little bit better since he stopped Wellbutrin.  His DME company is [employer].  04/24/20: 76 year old right-handed gentleman with an underlying medical history of diverticulosis, hypertension, hyperlipidemia, insulin resistance, vitamin D deficiency, edema, osteoarthritis, seasonal allergies, sleep apnea (uses CPAP), back pain, and obesity, who reports a longstanding history of approximately 10+ years maybe 12+ years of bilateral hand tremors.  His tremor has become worse over time.  Recently, when he started  Wellbutrin, he felt that the tremor became worse so he stopped the Wellbutrin but it did not really affect the tremors.  He restarted the Wellbutrin and did not notice a significant change.  He has noticed that caffeine affects his tremor.  His tremor is primarily noticeable when he holds something or uses his hands, sometimes he can suppress the tremor but not for long.  It is bilateral and he has also started noticing a quivering in his jaw and almost like a teeth chattering.  He recalls that his paternal grandfather had a severe hand tremor.  His father also had a tremor but he did not see his father very often.  Father died of congestive heart failure.  His dad lived to be 65 years old.  I reviewed your office note from 02/05/2020.  Patient has a sister and 1 brother.  He is the youngest of all 3.  His sister has Lewy body dementia and brother has prostate cancer, neither 1 have a significant or similar tremor.  He has 3 grown children, 2 sons and 1 daughter, as far as he knows, neither 1 has a tremor.  He is somewhat bothered by the tremor but not severely.  He drinks decaf coffee, about 2 cups/day and peach tea several servings per day.  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  He lives with his wife.  His Past Medical History Is Significant For: Past Medical History:  Diagnosis Date   Alcohol abuse    Allergy    Back pain    Cataract    forming   Colon polyps    Diverticulosis of colon    DJD (degenerative joint disease)    Dysrhythmia    Palpations from caffiene- reports he was having PACs due to slim fast wth caffeine - had full cardiac w/u and was told to cut back on caffeine;  now drink decaf bevrerages and no rpeort of recurrence    Enlarged prostate    GERD (gastroesophageal reflux disease)    Hearing loss    Hip osteoarthritis    Left   History of hay fever    Hx of cardiovascular stress test    Lexiscan Myoview (04/2013):  No ischemia, EF 61%; low risk   Hyperlipemia    Hypertension     Insulin resistance    om metfromin    Internal hemorrhoids without mention of complication    Leg edema    Neuroma of foot    left   Obesity    Osteoarthrosis, unspecified whether generalized or localized, unspecified site    Palpitations    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   Primary localized osteoarthrosis of right shoulder 05/25/2016   Recent weight loss    75lbs   Seasonal allergies    Sleep apnea    CPAP   Thyroid nodule    calcified thyroid nodules- bx negative    Tremor of both hands     His Past  Surgical History Is Significant For: Past Surgical History:  Procedure Laterality Date   COLONOSCOPY     POLYPECTOMY     SHOULDER SURGERY  25-Jun-2007   right   TONSILLECTOMY     TOTAL HIP ARTHROPLASTY Left 03/28/2017   Procedure: TOTAL HIP ARTHROPLASTY ANTERIOR APPROACH;  Surgeon: Frederik Pear, MD;  Location: [facility];  Service: Orthopedics;  Laterality: Left;   TOTAL HIP ARTHROPLASTY Right 09/25/2018   Procedure: Right Anterior Hip Arthroplasty;  Surgeon: Frederik Pear, MD;  Location: [facility];  Service: Orthopedics;  Laterality: Right;   TOTAL SHOULDER ARTHROPLASTY Left 10/14/2015   Procedure: LEFT TOTAL SHOULDER ARTHROPLASTY;  Surgeon: Marchia Bond, MD;  Location: [facility];  Service: Orthopedics;  Laterality: Left;   TOTAL SHOULDER ARTHROPLASTY Right 05/25/2016   Procedure: TOTAL SHOULDER ARTHROPLASTY;  Surgeon: Marchia Bond, MD;  Location: [facility];  Service: Orthopedics;  Laterality: Right;    His Family History Is Significant For: Family History  Problem Relation Age of Onset   Arthritis Mother    Cancer Mother 56       breast   Hyperlipidemia Mother    Colon polyps Mother 87   Hypertension Mother    Heart disease Mother    Depression Mother    Anxiety disorder Mother    Obesity Mother    Breast cancer Mother    Heart disease Father        CAD--passed away 06-25-11 age 35   Hyperlipidemia Father    Hypertension Father    Obesity Father    Diabetes Father     Depression Father    Sleep apnea Father    Cancer Sister 92       breast   Breast cancer Sister    Sleep apnea Sister    Cancer Brother 65       prostate   Prostate cancer Brother    Breast cancer Other    Prostate cancer Other    Colon cancer Neg Hx    Esophageal cancer Neg Hx    Rectal cancer Neg Hx    Stomach cancer Neg Hx     His Social History Is Significant For: Social History   Socioeconomic History   Marital status: Married    Spouse name: Zylan Almquist    Number of children: 3   Years of education: Not on file   Highest education level: Not on file  Occupational History   Occupation: Retired--[employer]  Tobacco Use   Smoking status: Never   Smokeless tobacco: Never  Vaping Use   Vaping Use: Never used  Substance and Sexual Activity   Alcohol use: Not Currently    Alcohol/week: 1.0 standard drink    Types: 1 Glasses of wine per week    Comment: rare----12 a year   Drug use: No   Sexual activity: Yes    Partners: Female  Other Topics Concern   Not on file  Social History Narrative   Lives with wife and daughter and two grandchildren.    Social Determinants of Health   Financial Resource Strain: Not on file  Food Insecurity: Not on file  Transportation Needs: Not on file  Physical Activity: Not on file  Stress: Not on file  Social Connections: Not on file    His Allergies Are:  Allergies  Allergen Reactions   Lisinopril Cough  :   His Current Medications Are:  Outpatient Encounter Medications as of 04/27/2021  Medication Sig   aspirin EC 81 MG tablet Take  1 tablet (81 mg total) by mouth 2 (two) times daily. (Patient taking differently: Take 81 mg by mouth once.)   atorvastatin (LIPITOR) 40 MG tablet TAKE 1 TABLET DAILY   clindamycin (CLEOCIN-T) 1 % lotion Apply topically 2 (two) times daily.   Coenzyme Q10 (COQ10) 200 MG CAPS Take 200 mg by mouth at bedtime.   COVID-19 mRNA bivalent vaccine, Pfizer, (PFIZER COVID-19 VAC BIVALENT)  injection Inject into the muscle.   fluocinonide ointment (LIDEX) 6.21 % Apply 1 application topically daily as needed (for bites).   fluticasone (FLONASE) 50 MCG/ACT nasal spray Place 2 sprays into both nostrils daily as needed for allergies.    loratadine (CLARITIN) 10 MG tablet Take 10 mg by mouth daily as needed for allergies.    meloxicam (MOBIC) 15 MG tablet TAKE 1 TABLET DAILY   metFORMIN (GLUCOPHAGE) 500 MG tablet Take 1 tablet (500 mg total) by mouth daily with breakfast.   OZEMPIC, 0.25 OR 0.5 MG/DOSE, 2 MG/1.5ML SOPN INJECT 0.5 MG UNDER THE SKIN ONCE A WEEK   pantoprazole (PROTONIX) 40 MG tablet TAKE 1 TABLET DAILY   tamsulosin (FLOMAX) 0.4 MG CAPS capsule TAKE 1 CAPSULE DAILY   valsartan (DIOVAN) 80 MG tablet TAKE 1 TABLET DAILY   Vitamin D, Ergocalciferol, (DRISDOL) 1.25 MG (50000 UNIT) CAPS capsule TAKE ONE CAPSULE BY MOUTH EVERY 7 DAYS   Zinc 50 MG CAPS Take by mouth daily.   albuterol (VENTOLIN HFA) 108 (90 Base) MCG/ACT inhaler Inhale 2 puffs into the lungs every 6 (six) hours as needed for wheezing or shortness of breath.   fluticasone (FLOVENT HFA) 110 MCG/ACT inhaler Inhale 2 puffs into the lungs 2 (two) times daily.   No facility-administered encounter medications on file as of 04/27/2021.  :  Review of Systems:  Out of a complete 14 point review of systems, all are reviewed and negative with the exception of these symptoms as listed below:  Review of Systems  Neurological:        Pt is here for tremor follow up. Pt states not much change since last year. Pt states he was suppose to get new CPAP machine has not received yet pt asked when does he call to get initial follow up with new CPAP. Told patient 30-60 days after starting new CPAP machine    Objective:  Neurological Exam  Physical Exam Physical Examination:   Vitals:   04/27/21 1051  BP: 110/71  Pulse: 64    General Examination: The patient is a very pleasant 76 y.o. male in no acute distress. He appears  well-developed and well-nourished and well groomed.   HEENT: Normocephalic, atraumatic, pupils are equal, round and reactive to light, extraocular tracking is good without limitation to gaze excursion or nystagmus noted. Normal smooth pursuit is noted. Hearing is grossly intact.  He has hearing loss, right hearing aid in place. Face is symmetric with normal facial animation no facial masking. Speech is clear with no dysarthria noted. There is no hypophonia. There is an intermittent mild lower jaw tremor. Neck is supple with full range of passive and active motion. There are no carotid bruits on auscultation. Oropharynx exam reveals: mild mouth dryness, good dental hygiene and moderate airway crowding. Tongue protrudes centrally and palate elevates symmetrically.    Chest: Clear to auscultation without wheezing, rhonchi or crackles noted.   Heart: S1+S2+0, regular and normal without murmurs, rubs or gallops noted.  Bradycardia noted.   Abdomen: Soft, non-tender and non-distended.   Extremities: There is no obvious  edema.      Skin: Warm and dry without trophic changes noted.   Musculoskeletal: exam reveals no obvious joint deformities, he is status post bilateral hip replacements as well as shoulder surgeries.    Neurologically:  Mental status: The patient is awake, alert and oriented in all 4 spheres. His immediate and remote memory, attention, language skills and fund of knowledge are appropriate. There is no evidence of aphasia, agnosia, apraxia or anomia. Speech is clear with normal prosody and enunciation. Thought process is linear. Mood is normal and affect is normal.  Cranial nerves II - XII are as described above under HEENT exam.  No drift, or rebound.  (On 04/24/2020: On Archimedes spiral drawing he has insecurity with the left hand, no significant trembling noted with the right or left hand, handwriting is legible, not particularly tremulous and not micrographic.)     He has a very mild  bilateral upper extremity postural and action tremor, no significant intention tremor, no resting tremor.   Romberg is negative, with the exception of mild sway. Fine motor skills and coordination: intact grossly in the upper and lower extremities.   Cerebellar testing: No dysmetria or intention tremor on finger to nose testing. There is no truncal or gait ataxia.  Sensory exam: intact to light touch in the upper and lower extremities.  Gait, station and balance: He stands without difficulty, he has a slightly asymmetrical hip height.  He walks without shuffling, has preserved arm swing.  Increase in lumbar kyphosis noted.   Assessment and Plan:   In summary, NESHAWN AIRD is a very pleasant 76 year old male with an underlying medical history of diverticulosis, hypertension, hyperlipidemia, insulin resistance, vitamin D deficiency, edema, osteoarthritis, seasonal allergies, sleep apnea (uses CPAP), back pain, and obesity, who presents for follow-up consultation of his essential tremor.  He has a history of tremors affecting his hands and his jaw for 10 to 12 years now. Findings are overall mild and stable at this point.  We talked about tremor triggers and mutually agreed to monitor his symptoms and examination.  We talked about symptomatic treatment options again today. A beta-blocker would not be a good choice for him because of his history of bradycardia. We could consider Mysoline in low-dose in the future.  He is advised that the tremor medications often address the appendicular tremor such as hand tremors better than the midline tremor such as head and neck and voice tremor or jaw tremor.  He may be able to get his new AutoPap machine soon.  He is advised to schedule a follow-up appointment in sleep clinic for compliance and symptom recheck within 3 months.  He has an appointment pending for April which we will be able to keep hopefully.  If we start him on symptomatic treatment for tremor  control, we will make a follow-up appointment accordingly.  I answered all his questions today and he was in agreement with our plan.  I spent 30 minutes in total face-to-face time and in reviewing records during pre-charting, more than 50% of which was spent in counseling and coordination of care, reviewing test results, reviewing medications and treatment regimen and/or in discussing or reviewing the diagnosis of ET, OSA, the prognosis and treatment options. Pertinent laboratory and imaging test results that were available during this visit with the patient were reviewed by me and considered in my medical decision making (see chart for details).

## 2021-04-27 NOTE — Patient Instructions (Signed)
It was nice to see you again today.  As discussed, we will continue to monitor your tremor, I do believe you have remained fairly stable. We reached out to your DME company, [employer] and they will process your order hopefully this week.  We may be able to keep your follow-up appointment for your compliance and symptom recheck for your sleep apnea as scheduled for April.  If we start you on medication for your tremor, we will make a follow-up appointment for this as well.

## 2021-04-29 ENCOUNTER — Other Ambulatory Visit: Payer: Self-pay

## 2021-04-29 ENCOUNTER — Encounter (INDEPENDENT_AMBULATORY_CARE_PROVIDER_SITE_OTHER): Payer: Self-pay | Admitting: Family Medicine

## 2021-04-29 ENCOUNTER — Ambulatory Visit (INDEPENDENT_AMBULATORY_CARE_PROVIDER_SITE_OTHER): Payer: Medicare Other | Admitting: Family Medicine

## 2021-04-29 VITALS — BP 111/74 | HR 69 | Temp 98.1°F | Ht 68.0 in | Wt 244.0 lb

## 2021-04-29 DIAGNOSIS — E1165 Type 2 diabetes mellitus with hyperglycemia: Secondary | ICD-10-CM | POA: Diagnosis not present

## 2021-04-29 DIAGNOSIS — G4733 Obstructive sleep apnea (adult) (pediatric): Secondary | ICD-10-CM | POA: Diagnosis not present

## 2021-04-29 DIAGNOSIS — E559 Vitamin D deficiency, unspecified: Secondary | ICD-10-CM | POA: Diagnosis not present

## 2021-04-29 DIAGNOSIS — Z6837 Body mass index (BMI) 37.0-37.9, adult: Secondary | ICD-10-CM

## 2021-04-29 DIAGNOSIS — E669 Obesity, unspecified: Secondary | ICD-10-CM | POA: Diagnosis not present

## 2021-04-29 DIAGNOSIS — Z7985 Long-term (current) use of injectable non-insulin antidiabetic drugs: Secondary | ICD-10-CM | POA: Diagnosis not present

## 2021-04-29 MED ORDER — TIRZEPATIDE 5 MG/0.5ML ~~LOC~~ SOAJ: 5.0000 mg | SUBCUTANEOUS | 0 refills | Status: DC

## 2021-04-29 MED ORDER — VITAMIN D (ERGOCALCIFEROL) 1.25 MG (50000 UNIT) PO CAPS: ORAL_CAPSULE | ORAL | 1 refills | Status: DC

## 2021-04-29 MED ORDER — METFORMIN HCL 500 MG PO TABS: 500.0000 mg | ORAL_TABLET | Freq: Every day | ORAL | 1 refills | Status: DC

## 2021-04-30 ENCOUNTER — Encounter (INDEPENDENT_AMBULATORY_CARE_PROVIDER_SITE_OTHER): Payer: Self-pay | Admitting: Family Medicine

## 2021-04-30 NOTE — Telephone Encounter (Signed)
Please see message . Thank you .

## 2021-04-30 NOTE — Telephone Encounter (Signed)
Dr.Beasley 

## 2021-04-30 NOTE — Progress Notes (Signed)
Chief Complaint:   OBESITY Eric [name] is here to discuss his progress with his obesity treatment plan along with follow-up of his obesity related diagnoses. Eric [name] is on the Category 3 Plan and states he is following his eating plan approximately 50% of the time. Eric [name] states he is riding the stationary bike for 20 minutes 5 times per week.  Today's visit was #: 18 Starting weight: 288 lbs Starting date: 12/14/2017 Today's weight: 244 lbs Today's date: 04/29/2021 Total lbs lost to date: 74 Total lbs lost since last in-office visit: 0  Interim History: Eric [name] had some challenges since his last visit which includes traveling, death in his family, and celebrations of his birthday and anniversary.  Subjective:   1. Type 2 diabetes mellitus with hyperglycemia, without long-term current use of insulin (HCC) Eric [name] is taking Ozempic 0.5 mg, and he reports side effects of reflux. He is taking Protonix and metformin 500 mg. Last A1c was 5.7 and fasting insulin looked better at 10.4.  2. Vitamin D deficiency Eric [name] is taking Vitamin D 50,000 IU weekly, and he denies side effects.  3. Obstructive sleep apnea Eric [name] recently had a HST, and he saw Dr. Rexene [name] last on 05/07/2021.  Assessment/Plan:   1. Type 2 diabetes mellitus with hyperglycemia, without long-term current use of insulin (HCC) Eric [name] is to stop Ozempic, and he agreed to start Mounjaro 5 mg weekly with no refills. Side effects were discussed. We will refill metformin for 2 months. He will continue working on dietary changes, exercise, and weight loss. Good blood sugar control is important to decrease the likelihood of diabetic complications such as nephropathy, neuropathy, limb loss, blindness, coronary artery disease, and death. Intensive lifestyle modification including diet, exercise and weight loss are the first line of treatment for diabetes.   - metFORMIN (GLUCOPHAGE) 500 MG tablet; Take 1 tablet (500 mg total) by mouth  daily with breakfast.  Dispense: 30 tablet; Refill: 1 - tirzepatide (MOUNJARO) 5 MG/0.5ML Pen; Inject 5 mg into the skin once a week.  Dispense: 6 mL; Refill: 0  2. Vitamin D deficiency Low Vitamin D level contributes to fatigue and are associated with obesity, breast, and colon cancer. We will refill prescription Vitamin D for 2 months. Eric [name] will follow-up for routine testing of Vitamin D, at least 2-3 times per year to avoid over-replacement.  - Vitamin D, Ergocalciferol, (DRISDOL) 1.25 MG (50000 UNIT) CAPS capsule; TAKE ONE CAPSULE BY MOUTH EVERY 7 DAYS  Dispense: 4 capsule; Refill: 1  3. Obstructive sleep apnea Intensive lifestyle modifications are the first line treatment for this issue. We discussed several lifestyle modifications today Eric [name] will continue his CPAP nightly, and he is waiting on new CPAP supplies. He will continue to work on diet, exercise and weight loss efforts. We will continue to monitor. Orders and follow up as documented in patient record.   4. Obesity BMI today is 37.2 Eric [name] is currently in the action stage of change. As such, his goal is to continue with weight loss efforts. He has agreed to the Category 3 Plan.   Exercise goals: As is.  Behavioral modification strategies: increasing lean protein intake and increasing water intake.  Eric [name] has agreed to follow-up with our clinic in 2 months. He was informed of the importance of frequent follow-up visits to maximize his success with intensive lifestyle modifications for his multiple health conditions.   Objective:   Blood pressure 111/74, pulse 69, temperature 98.1 F (36.7 C), height 5\' 8"  (1.727 m), weight  244 lb (110.7 kg), SpO2 97 %. Body mass index is 37.1 kg/m.  General: Cooperative, alert, well developed, in no acute distress. HEENT: Conjunctivae and lids unremarkable. Cardiovascular: Regular rhythm.  Lungs: Normal work of breathing. Neurologic: No focal deficits.   Lab Results  Component  Value Date   CREATININE 1.02 03/05/2021   BUN 21 03/05/2021   NA 139 03/05/2021   K 4.3 03/05/2021   CL 104 03/05/2021   CO2 29 03/05/2021   Lab Results  Component Value Date   ALT 22 03/05/2021   AST 13 03/05/2021   ALKPHOS 51 03/05/2021   BILITOT 0.8 03/05/2021   Lab Results  Component Value Date   HGBA1C 5.7 03/05/2021   HGBA1C 5.6 09/01/2020   HGBA1C 5.4 03/03/2020   HGBA1C 5.6 10/22/2019   HGBA1C 5.3 06/18/2019   Lab Results  Component Value Date   INSULIN 9.0 10/22/2019   INSULIN 21.3 12/14/2017   Lab Results  Component Value Date   TSH 1.060 12/14/2017   Lab Results  Component Value Date   CHOL 115 03/05/2021   HDL 49.30 03/05/2021   LDLCALC 55 03/05/2021   TRIG 53.0 03/05/2021   CHOLHDL 2 03/05/2021   Lab Results  Component Value Date   VD25OH 43.17 03/05/2021   VD25OH 44.50 09/01/2020   VD25OH 49.51 03/03/2020   Lab Results  Component Value Date   WBC 5.4 03/05/2021   HGB 14.7 03/05/2021   HCT 44.2 03/05/2021   MCV 92.3 03/05/2021   PLT 163.0 03/05/2021   No results found for: IRON, TIBC, FERRITIN  Obesity Behavioral Intervention:   Approximately 15 minutes were spent on the discussion below.  ASK: We discussed the diagnosis of obesity with Eric [name] today and Eric [name] agreed to give [place] permission to discuss obesity behavioral modification therapy today.  ASSESS: Eric [name] has the diagnosis of obesity and his BMI today is 37.2. Eric [name] is in the action stage of change.   ADVISE: Eric [name] was educated on the multiple health risks of obesity as well as the benefit of weight loss to improve his health. He was advised of the need for long term treatment and the importance of lifestyle modifications to improve his current health and to decrease his risk of future health problems.  AGREE: Multiple dietary modification options and treatment options were discussed and Eric [name] agreed to follow the recommendations documented in the above  note.  ARRANGE: Eric [name] was educated on the importance of frequent visits to treat obesity as outlined per CMS and USPSTF guidelines and agreed to schedule his next follow up appointment today.  Attestation Statements:   Reviewed by clinician on day of visit: allergies, medications, problem list, medical history, surgical history, family history, social history, and previous encounter notes.   I, Trixie Dredge, am acting as transcriptionist for [name], MD.  I have reviewed the above documentation for accuracy and completeness, and I agree with the above. -  [name], MD

## 2021-05-05 ENCOUNTER — Telehealth (INDEPENDENT_AMBULATORY_CARE_PROVIDER_SITE_OTHER): Payer: Self-pay | Admitting: Family Medicine

## 2021-05-05 ENCOUNTER — Encounter (INDEPENDENT_AMBULATORY_CARE_PROVIDER_SITE_OTHER): Payer: Self-pay

## 2021-05-05 NOTE — Telephone Encounter (Signed)
Prior authorization approved for [facility]. Patient sent message via mychart. Patient has [employer], pt not able to use copay card.

## 2021-06-03 ENCOUNTER — Other Ambulatory Visit: Payer: Self-pay | Admitting: Family Medicine

## 2021-06-30 ENCOUNTER — Ambulatory Visit (INDEPENDENT_AMBULATORY_CARE_PROVIDER_SITE_OTHER): Payer: Medicare Other | Admitting: Neurology

## 2021-06-30 ENCOUNTER — Encounter: Payer: Self-pay | Admitting: Neurology

## 2021-06-30 VITALS — BP 136/72 | HR 52 | Ht 69.0 in | Wt 263.4 lb

## 2021-06-30 DIAGNOSIS — G250 Essential tremor: Secondary | ICD-10-CM

## 2021-06-30 DIAGNOSIS — G4733 Obstructive sleep apnea (adult) (pediatric): Secondary | ICD-10-CM | POA: Diagnosis not present

## 2021-06-30 DIAGNOSIS — Z9989 Dependence on other enabling machines and devices: Secondary | ICD-10-CM | POA: Diagnosis not present

## 2021-06-30 NOTE — Progress Notes (Signed)
Subjective:  ?  ?Patient ID: Eric [name] is a 76 y.o. male. ? ?HPI ? ? ? ?Interim history:  ? ?Eric [name] he is a 76 year old right-handed gentleman with an complex medical history of hypertension, hyperlipidemia, insulin resistance, osteoarthritis, palpitations, allergies, and tremors, chronic low back pain, BPH, sleep apnea, vitamin D deficiency, edema, and obesity, who presents for consultation of his OSA and essential tremor. I last saw him on 04/27/21, at which time we talked about symptomatic treatment options for tremors.  He was not really a good candidate for beta-blocker due to bradycardia.  We talked about utilizing Mysoline in low-dose down the road but he was advised that it is not always very successful for midline tremors such as jaw or head tremors or voice tremor.  He had struggled with low back pain.  He had received injections.  He was trying to hydrate well and limit his caffeine.  He had not started with a new AutoPap machine at the time, he was using a Designer, multimedia. ? ?He received a new AutoPap machine in the interim on 05/07/2021, he has a ResMed air sense 11 AutoSet machine. ? ?Today, 06/30/2021: I reviewed his AutoPap compliance data from 05/07/2021 through 06/05/2021, which is a total of 30 days, during which time he used his machine every night with percent use days greater than 4 hours at 100%, indicating superb compliance with an average usage of 7 hours and 40 minutes, residual AHI 0.4/h, 95th percentile pressure at 9.5 cm with a range of 7 to 13 cm, leak acceptable with the 95th percentile at 10.1 L/min.  He uses a nasal mask with good success, he is a side sleeper.  He reports that this mask allows him to sleep on his sides.  He is pleased with his new machine and fully compliant, he benefits from treatment and is motivated to continue with it.  Tremors stable, he had recent allergy symptoms, even asthma-like wheezing, he was treated symptomatically for bronchitis by his primary  care physician recently.  He has not noticed much in the way of benefit from his inhaler which he tried.  He is currently not on Ozempic, he stopped it because of flareup and reflux symptoms and he never started [facility]. ? ?The patient's allergies, current medications, family history, past medical history, past social history, past surgical history and problem list were reviewed and updated as appropriate.  ? ?Previously:  ? ? ?He had a home sleep test in the interim on 03/17/2021 which showed evidence of moderate obstructive sleep apnea with an AHI of 19.2/h, O2 nadir 86% with mild to moderate snoring detected.  I wrote for a new Pap machine. ?  ?  ?03/03/21: (He) was previously diagnosed with obstructive sleep apnea and placed on PAP therapy.  I was able to review a sleep study result from 12/08/2007.  Study was conducted at the [facility] sleep center.  This was a CPAP titration study, he was titrated from a pressure of 4 cm to 15 cm.  I was also able to review a result from a baseline polysomnogram conducted at the same center located in [place], [place], study date of 11/19/2007.  Sleep latency was 7.4 minutes, REM latency 90.9 minutes, sleep efficiency 94.4%.  He achieved very little slow-wave sleep and REM sleep was 9.5%, average oxygen saturation was 96.4%, desaturation nadir was 79%.  Total AHI was 15.8/h.  He has an older CPAP machine.  He is in need for a replacement.  His Epworth sleepiness score is 11 out of 24, fatigue severity score is 29 out of 63.  ?In fact, his previous PAP stopped working and he has a Designer, multimedia since 12/10/2020.  He is fully compliant with treatment and I was able to review compliance data from the last nearly 3 months.  Average AHI 0.9/h, at goal, average pressure for the 95th percentile at 10.5 cm, patient on the default range of AutoPap from 5 to 20 cm.  Leak acceptable, actually on the low side, with the 95th percentile at 3.6 L/min.  He reports  that his original CPAP was set to 15 cm and then he received an AutoPap machine after that, this is his third machine essentially but he needs a replacement because this is a Designer, multimedia.  When he started treatment for sleep apnea he had significant improvement in his snoring, nocturia and daytime energy.  He has been working on weight loss and goes to the weight management clinic for the past 3 years.  As far as his hand tremor, his father had a tremor and paternal family history is positive for tremors.  He feels that his tremor is perhaps a little bit better since he stopped Wellbutrin.  His DME company is [employer]. ?  ?04/24/20: 76 year old right-handed gentleman with an underlying medical history of diverticulosis, hypertension, hyperlipidemia, insulin resistance, vitamin D deficiency, edema, osteoarthritis, seasonal allergies, sleep apnea (uses CPAP), back pain, and obesity, who reports a longstanding history of approximately 10+ years maybe 12+ years of bilateral hand tremors.  His tremor has become worse over time.  Recently, when he started Wellbutrin, he felt that the tremor became worse so he stopped the Wellbutrin but it did not really affect the tremors.  He restarted the Wellbutrin and did not notice a significant change.  He has noticed that caffeine affects his tremor.  His tremor is primarily noticeable when he holds something or uses his hands, sometimes he can suppress the tremor but not for long.  It is bilateral and he has also started noticing a quivering in his jaw and almost like a teeth chattering.  He recalls that his paternal grandfather had a severe hand tremor.  His father also had a tremor but he did not see his father very often.  Father died of congestive heart failure.  His dad lived to be 30 years old.  I reviewed your office note from 02/05/2020.  ?Patient has a sister and 1 brother.  He is the youngest of all 3.  His sister has Lewy body dementia and brother has prostate cancer,  neither 1 have a significant or similar tremor.  He has 3 grown children, 2 sons and 1 daughter, as far as he knows, neither 1 has a tremor.  He is somewhat bothered by the tremor but not severely.  He drinks decaf coffee, about 2 cups/day and peach tea several servings per day.  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  He lives with his wife. ? ?His Past Medical History Is Significant For: ?Past Medical History:  ?Diagnosis Date  ? Alcohol abuse   ? Allergy   ? Back pain   ? Cataract   ? forming  ? Colon polyps   ? Diverticulosis of colon   ? DJD (degenerative joint disease)   ? Dysrhythmia   ? Palpations from caffiene- reports he was having PACs due to slim fast wth caffeine - had full cardiac w/u and was told to cut back on  caffeine;  now drink decaf bevrerages and no rpeort of recurrence   ? Enlarged prostate   ? GERD (gastroesophageal reflux disease)   ? Hearing loss   ? Hip osteoarthritis   ? Left  ? History of hay fever   ? Hx of cardiovascular stress test   ? Lexiscan Myoview (04/2013):  No ischemia, EF 61%; low risk  ? Hyperlipemia   ? Hypertension   ? Insulin resistance   ? om metfromin   ? Internal hemorrhoids without mention of complication   ? Leg edema   ? Neuroma of foot   ? left  ? Obesity   ? Osteoarthrosis, unspecified whether generalized or localized, unspecified site   ? Palpitations   ? 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  ? Primary localized osteoarthrosis of right shoulder 05/25/2016  ? Recent weight loss   ? 75lbs  ? Seasonal allergies   ? Sleep apnea   ? CPAP  ? Thyroid nodule   ? calcified thyroid nodules- bx negative   ? Tremor of both hands   ? ? ?His Past Surgical History Is Significant For: ?Past Surgical History:  ?Procedure Laterality Date  ? COLONOSCOPY    ? POLYPECTOMY    ? SHOULDER SURGERY  2009  ? right  ? TONSILLECTOMY    ? TOTAL HIP ARTHROPLASTY Left 03/28/2017  ? Procedure: TOTAL HIP ARTHROPLASTY ANTERIOR APPROACH;  Surgeon: Frederik Pear, MD;  Location: [place];  Service:  Orthopedics;  Laterality: Left;  ? TOTAL HIP ARTHROPLASTY Right 09/25/2018  ? Procedure: Right Anterior Hip Arthroplasty;  Surgeon: Frederik Pear, MD;  Location: [facility];  Service: Orthopedics;  Laterality: [place]

## 2021-07-01 ENCOUNTER — Encounter: Payer: Self-pay | Admitting: Family Medicine

## 2021-07-01 ENCOUNTER — Other Ambulatory Visit: Payer: Self-pay | Admitting: Family Medicine

## 2021-07-01 DIAGNOSIS — N400 Enlarged prostate without lower urinary tract symptoms: Secondary | ICD-10-CM

## 2021-07-01 MED ORDER — VALSARTAN 80 MG PO TABS: 80.0000 mg | ORAL_TABLET | Freq: Every day | ORAL | 1 refills | Status: DC

## 2021-07-01 MED ORDER — MELOXICAM 15 MG PO TABS: 15.0000 mg | ORAL_TABLET | Freq: Every day | ORAL | 1 refills | Status: DC

## 2021-07-02 ENCOUNTER — Encounter (INDEPENDENT_AMBULATORY_CARE_PROVIDER_SITE_OTHER): Payer: Self-pay | Admitting: Family Medicine

## 2021-07-02 ENCOUNTER — Ambulatory Visit (INDEPENDENT_AMBULATORY_CARE_PROVIDER_SITE_OTHER): Payer: Medicare Other | Admitting: Family Medicine

## 2021-07-02 VITALS — BP 121/72 | HR 48 | Temp 98.0°F | Ht 69.0 in | Wt 250.0 lb

## 2021-07-02 DIAGNOSIS — Z7984 Long term (current) use of oral hypoglycemic drugs: Secondary | ICD-10-CM | POA: Diagnosis not present

## 2021-07-02 DIAGNOSIS — Z6838 Body mass index (BMI) 38.0-38.9, adult: Secondary | ICD-10-CM | POA: Diagnosis not present

## 2021-07-02 DIAGNOSIS — E1165 Type 2 diabetes mellitus with hyperglycemia: Secondary | ICD-10-CM

## 2021-07-02 DIAGNOSIS — E559 Vitamin D deficiency, unspecified: Secondary | ICD-10-CM

## 2021-07-02 DIAGNOSIS — E669 Obesity, unspecified: Secondary | ICD-10-CM

## 2021-07-02 MED ORDER — VITAMIN D (ERGOCALCIFEROL) 1.25 MG (50000 UNIT) PO CAPS: ORAL_CAPSULE | ORAL | 1 refills | Status: DC

## 2021-07-02 MED ORDER — METFORMIN HCL 500 MG PO TABS: 500.0000 mg | ORAL_TABLET | Freq: Every day | ORAL | 1 refills | Status: DC

## 2021-07-08 DIAGNOSIS — M5451 Vertebrogenic low back pain: Secondary | ICD-10-CM | POA: Diagnosis not present

## 2021-07-09 NOTE — Progress Notes (Signed)
? ? ? ?Chief Complaint:  ? ?OBESITY ?Eric [name] is here to discuss his progress with his obesity treatment plan along with follow-up of his obesity related diagnoses. Messi is on the Category 3 Plan and states he is following his eating plan approximately 10% of the time. Eliodoro states he is doing 0 minutes 0 times per week. ? ?Today's visit was #: 10 ?Starting weight: 288 lbs ?Starting date: 12/14/2017 ?Today's weight: 250 lbs ?Today's date: 07/02/2021 ?Total lbs lost to date: 57 ?Total lbs lost since last in-office visit: 0 ? ?Interim History: Eric [name] has had more social eating in the last 2 months. He is trying to portion control and make smarter choices, but he has gotten off track. He is working on meal planning again.  ? ?Subjective:  ? ?1. Type 2 diabetes mellitus with hyperglycemia, without long-term current use of insulin ([place]) ?Eric [name] hasn't started [facility] yet as he is uncertain if he wants to start this. He continues to work on his diet and exercise.  ? ?2. Vitamin D deficiency ?Eric [name] is stable on Vitamin D, and he denies nausea or vomiting. ? ?Assessment/Plan:  ? ?1. Type 2 diabetes mellitus with hyperglycemia, without long-term current use of insulin ([place]) ?We will refill metformin for 2 months. Good blood sugar control is important to decrease the likelihood of diabetic complications such as nephropathy, neuropathy, limb loss, blindness, coronary artery disease, and death. Intensive lifestyle modification including diet, exercise and weight loss are the first line of treatment for diabetes.  ? ?- metFORMIN (GLUCOPHAGE) 500 MG tablet; Take 1 tablet (500 mg total) by mouth daily with breakfast.  Dispense: 30 tablet; Refill: 1 ? ?2. Vitamin D deficiency ?We will refill prescription Vitamin D for 2 months. [place] will follow-up for routine testing of Vitamin D, at least 2-3 times per year to avoid over-replacement. ? ?- Vitamin D, Ergocalciferol, (DRISDOL) 1.25 MG (50000 UNIT) CAPS capsule; TAKE ONE  CAPSULE BY MOUTH EVERY 7 DAYS  Dispense: 4 capsule; Refill: 1 ? ?3. Obesity BMI today is 38.1 ?Eric [name] is currently in the action stage of change. As such, his goal is to continue with weight loss efforts. He has agreed to the Category 3 Plan.  ? ?Behavioral modification strategies: increasing lean protein intake and meal planning and cooking strategies. ? ?[name] has agreed to follow-up with our clinic in 4 weeks. He was informed of the importance of frequent follow-up visits to maximize his success with intensive lifestyle modifications for his multiple health conditions.  ? ?Objective:  ? ?Blood pressure 121/72, pulse (!) 48, temperature 98 ?F (36.7 ?C), height '5\' 9"'$  (1.753 m), weight 250 lb (113.4 kg), SpO2 97 %. ?Body mass index is 36.92 kg/m?. ? ?General: Cooperative, alert, well developed, in no acute distress. ?HEENT: Conjunctivae and lids unremarkable. ?Cardiovascular: Regular rhythm.  ?Lungs: Normal work of breathing. ?Neurologic: No focal deficits.  ? ?Lab Results  ?Component Value Date  ? CREATININE 1.02 03/05/2021  ? BUN 21 03/05/2021  ? NA 139 03/05/2021  ? K 4.3 03/05/2021  ? CL 104 03/05/2021  ? CO2 29 03/05/2021  ? ?Lab Results  ?Component Value Date  ? ALT 22 03/05/2021  ? AST 13 03/05/2021  ? ALKPHOS 51 03/05/2021  ? BILITOT 0.8 03/05/2021  ? ?Lab Results  ?Component Value Date  ? HGBA1C 5.7 03/05/2021  ? HGBA1C 5.6 09/01/2020  ? HGBA1C 5.4 03/03/2020  ? HGBA1C 5.6 10/22/2019  ? HGBA1C 5.3 06/18/2019  ? ?Lab Results  ?Component Value Date  ? INSULIN  9.0 10/22/2019  ? INSULIN 21.3 12/14/2017  ? ?Lab Results  ?Component Value Date  ? TSH 1.060 12/14/2017  ? ?Lab Results  ?Component Value Date  ? CHOL 115 03/05/2021  ? HDL 49.30 03/05/2021  ? [place] 55 03/05/2021  ? TRIG 53.0 03/05/2021  ? CHOLHDL 2 03/05/2021  ? ?Lab Results  ?Component Value Date  ? VD25OH 43.17 03/05/2021  ? VD25OH 44.50 09/01/2020  ? VD25OH 49.51 03/03/2020  ? ?Lab Results  ?Component Value Date  ? WBC 5.4 03/05/2021  ? HGB  14.7 03/05/2021  ? HCT 44.2 03/05/2021  ? MCV 92.3 03/05/2021  ? PLT 163.0 03/05/2021  ? ?No results found for: IRON, TIBC, FERRITIN ? ?Obesity Behavioral Intervention:  ? ?Approximately 15 minutes were spent on the discussion below. ? ?ASK: ?We discussed the diagnosis of obesity with [name] today and Chesley agreed to give [place] permission to discuss obesity behavioral modification therapy today. ? ?ASSESS: ?[name] has the diagnosis of obesity and his BMI today is 38.1. [name] is in the action stage of change.  ? ?ADVISE: ?[name] was educated on the multiple health risks of obesity as well as the benefit of weight loss to improve his health. He was advised of the need for long term treatment and the importance of lifestyle modifications to improve his current health and to decrease his risk of future health problems. ? ?AGREE: ?Multiple dietary modification options and treatment options were discussed and [name] agreed to follow the recommendations documented in the above note. ? ?ARRANGE: ?Jcion was educated on the importance of frequent visits to treat obesity as outlined per CMS and USPSTF guidelines and agreed to schedule his next follow up appointment today. ? ?Attestation Statements:  ? ?Reviewed by clinician on day of visit: allergies, medications, problem list, medical history, surgical history, family history, social history, and previous encounter notes. ? ? ?I, Trixie Dredge, am acting as transcriptionist for [name], MD. ? ?I have reviewed the above documentation for accuracy and completeness, and I agree with the above. -  [name], MD ? ? ?

## 2021-07-13 DIAGNOSIS — M5451 Vertebrogenic low back pain: Secondary | ICD-10-CM | POA: Diagnosis not present

## 2021-07-15 DIAGNOSIS — M5451 Vertebrogenic low back pain: Secondary | ICD-10-CM | POA: Diagnosis not present

## 2021-07-20 DIAGNOSIS — M5451 Vertebrogenic low back pain: Secondary | ICD-10-CM | POA: Diagnosis not present

## 2021-07-22 ENCOUNTER — Other Ambulatory Visit: Payer: Self-pay | Admitting: Family Medicine

## 2021-07-22 DIAGNOSIS — M5451 Vertebrogenic low back pain: Secondary | ICD-10-CM | POA: Diagnosis not present

## 2021-07-22 DIAGNOSIS — K219 Gastro-esophageal reflux disease without esophagitis: Secondary | ICD-10-CM

## 2021-07-27 DIAGNOSIS — M5451 Vertebrogenic low back pain: Secondary | ICD-10-CM | POA: Diagnosis not present

## 2021-07-29 DIAGNOSIS — M5451 Vertebrogenic low back pain: Secondary | ICD-10-CM | POA: Diagnosis not present

## 2021-08-03 ENCOUNTER — Ambulatory Visit (INDEPENDENT_AMBULATORY_CARE_PROVIDER_SITE_OTHER): Payer: Medicare Other | Admitting: Family Medicine

## 2021-08-03 ENCOUNTER — Encounter (INDEPENDENT_AMBULATORY_CARE_PROVIDER_SITE_OTHER): Payer: Self-pay | Admitting: Family Medicine

## 2021-08-03 VITALS — BP 116/68 | HR 62 | Temp 97.9°F | Ht 69.0 in | Wt 250.0 lb

## 2021-08-03 DIAGNOSIS — E559 Vitamin D deficiency, unspecified: Secondary | ICD-10-CM

## 2021-08-03 DIAGNOSIS — E669 Obesity, unspecified: Secondary | ICD-10-CM

## 2021-08-03 DIAGNOSIS — Z7985 Long-term (current) use of injectable non-insulin antidiabetic drugs: Secondary | ICD-10-CM | POA: Diagnosis not present

## 2021-08-03 DIAGNOSIS — E1165 Type 2 diabetes mellitus with hyperglycemia: Secondary | ICD-10-CM | POA: Diagnosis not present

## 2021-08-03 DIAGNOSIS — Z6836 Body mass index (BMI) 36.0-36.9, adult: Secondary | ICD-10-CM | POA: Diagnosis not present

## 2021-08-05 ENCOUNTER — Ambulatory Visit: Payer: TRICARE For Life (TFL)

## 2021-08-05 ENCOUNTER — Ambulatory Visit (INDEPENDENT_AMBULATORY_CARE_PROVIDER_SITE_OTHER): Payer: Medicare Other

## 2021-08-05 DIAGNOSIS — M5451 Vertebrogenic low back pain: Secondary | ICD-10-CM | POA: Diagnosis not present

## 2021-08-05 DIAGNOSIS — Z0000 Encounter for general adult medical examination without abnormal findings: Secondary | ICD-10-CM | POA: Diagnosis not present

## 2021-08-05 NOTE — Patient Instructions (Signed)
Eric [name] , ?Thank you for taking time to come for your Medicare Wellness Visit. I appreciate your ongoing commitment to your health goals. Please review the following plan we discussed and let me know if I can assist you in the future.  ? ?Screening recommendations/referrals: ?Colonoscopy: 04/03/18 due 04/04/23 ?Recommended yearly ophthalmology/optometry visit for glaucoma screening and checkup ?Recommended yearly dental visit for hygiene and checkup ? ?Vaccinations: ?Influenza vaccine: up to date ?Pneumococcal vaccine: up to date ?Tdap vaccine: up to date ?Shingles vaccine: up to date   ?Covid-19: completed ? ?Advanced directives: yes, not on file ? ?Conditions/risks identified: see problem list ? ?Next appointment: Follow up in one year for your annual wellness visit.  ? ?Preventive Care 75 Years and Older, Male ?Preventive care refers to lifestyle choices and visits with your health care provider that can promote health and wellness. ?What does preventive care include? ?A yearly physical exam. This is also called an annual well check. ?Dental exams once or twice a year. ?Routine eye exams. Ask your health care provider how often you should have your eyes checked. ?Personal lifestyle choices, including: ?Daily care of your teeth and gums. ?Regular physical activity. ?Eating a healthy diet. ?Avoiding tobacco and drug use. ?Limiting alcohol use. ?Practicing safe sex. ?Taking low doses of aspirin every day. ?Taking vitamin and mineral supplements as recommended by your health care provider. ?What happens during an annual well check? ?The services and screenings done by your health care provider during your annual well check will depend on your age, overall health, lifestyle risk factors, and family history of disease. ?Counseling  ?Your health care provider may ask you questions about your: ?Alcohol use. ?Tobacco use. ?Drug use. ?Emotional well-being. ?Home and relationship well-being. ?Sexual activity. ?Eating  habits. ?History of falls. ?Memory and ability to understand (cognition). ?Work and work Statistician. ?Screening  ?You may have the following tests or measurements: ?Height, weight, and BMI. ?Blood pressure. ?Lipid and cholesterol levels. These may be checked every 5 years, or more frequently if you are over 20 years old. ?Skin check. ?Lung cancer screening. You may have this screening every year starting at age 64 if you have a 30-pack-year history of smoking and currently smoke or have quit within the past 15 years. ?Fecal occult blood test (FOBT) of the stool. You may have this test every year starting at age 70. ?Flexible sigmoidoscopy or colonoscopy. You may have a sigmoidoscopy every 5 years or a colonoscopy every 10 years starting at age 52. ?Prostate cancer screening. Recommendations will vary depending on your family history and other risks. ?Hepatitis C blood test. ?Hepatitis B blood test. ?Sexually transmitted disease (STD) testing. ?Diabetes screening. This is done by checking your blood sugar (glucose) after you have not eaten for a while (fasting). You may have this done every 1-3 years. ?Abdominal aortic aneurysm (AAA) screening. You may need this if you are a current or former smoker. ?Osteoporosis. You may be screened starting at age 23 if you are at high risk. ?Talk with your health care provider about your test results, treatment options, and if necessary, the need for more tests. ?Vaccines  ?Your health care provider may recommend certain vaccines, such as: ?Influenza vaccine. This is recommended every year. ?Tetanus, diphtheria, and acellular pertussis (Tdap, Td) vaccine. You may need a Td booster every 10 years. ?Zoster vaccine. You may need this after age 63. ?Pneumococcal 13-valent conjugate (PCV13) vaccine. One dose is recommended after age 64. ?Pneumococcal polysaccharide (PPSV23) vaccine. One dose is recommended  after age 48. ?Talk to your health care provider about which screenings and  vaccines you need and how often you need them. ?This information is not intended to replace advice given to you by your health care provider. Make sure you discuss any questions you have with your health care provider. ?Document Released: 04/04/2015 Document Revised: 11/26/2015 Document Reviewed: 01/07/2015 ?Elsevier Interactive Patient Education ? 2017 [employer]. ? ?Fall Prevention in the Home ?Falls can cause injuries. They can happen to people of all ages. There are many things you can do to make your home safe and to help prevent falls. ?What can I do on the outside of my home? ?Regularly fix the edges of walkways and driveways and fix any cracks. ?Remove anything that might make you trip as you walk through a door, such as a raised step or threshold. ?Trim any bushes or trees on the path to your home. ?Use bright outdoor lighting. ?Clear any walking paths of anything that might make someone trip, such as rocks or tools. ?Regularly check to see if handrails are loose or broken. Make sure that both sides of any steps have handrails. ?Any raised decks and porches should have guardrails on the edges. ?Have any leaves, snow, or ice cleared regularly. ?Use sand or salt on walking paths during winter. ?Clean up any spills in your garage right away. This includes oil or grease spills. ?What can I do in the bathroom? ?Use night lights. ?Install grab bars by the toilet and in the tub and shower. Do not use towel bars as grab bars. ?Use non-skid mats or decals in the tub or shower. ?If you need to sit down in the shower, use a plastic, non-slip stool. ?Keep the floor dry. Clean up any water that spills on the floor as soon as it happens. ?Remove soap buildup in the tub or shower regularly. ?Attach bath mats securely with double-sided non-slip rug tape. ?Do not have throw rugs and other things on the floor that can make you trip. ?What can I do in the bedroom? ?Use night lights. ?Make sure that you have a light by your  bed that is easy to reach. ?Do not use any sheets or blankets that are too big for your bed. They should not hang down onto the floor. ?Have a firm chair that has side arms. You can use this for support while you get dressed. ?Do not have throw rugs and other things on the floor that can make you trip. ?What can I do in the kitchen? ?Clean up any spills right away. ?Avoid walking on wet floors. ?Keep items that you use a lot in easy-to-reach places. ?If you need to reach something above you, use a strong step stool that has a grab bar. ?Keep electrical cords out of the way. ?Do not use floor polish or wax that makes floors slippery. If you must use wax, use non-skid floor wax. ?Do not have throw rugs and other things on the floor that can make you trip. ?What can I do with my stairs? ?Do not leave any items on the stairs. ?Make sure that there are handrails on both sides of the stairs and use them. Fix handrails that are broken or loose. Make sure that handrails are as long as the stairways. ?Check any carpeting to make sure that it is firmly attached to the stairs. Fix any carpet that is loose or worn. ?Avoid having throw rugs at the top or bottom of the stairs. If you  do have throw rugs, attach them to the floor with carpet tape. ?Make sure that you have a light switch at the top of the stairs and the bottom of the stairs. If you do not have them, ask someone to add them for you. ?What else can I do to help prevent falls? ?Wear shoes that: ?Do not have high heels. ?Have rubber bottoms. ?Are comfortable and fit you well. ?Are closed at the toe. Do not wear sandals. ?If you use a stepladder: ?Make sure that it is fully opened. Do not climb a closed stepladder. ?Make sure that both sides of the stepladder are locked into place. ?Ask someone to hold it for you, if possible. ?Clearly mark and make sure that you can see: ?Any grab bars or handrails. ?First and last steps. ?Where the edge of each step is. ?Use tools that  help you move around (mobility aids) if they are needed. These include: ?Canes. ?Walkers. ?Scooters. ?Crutches. ?Turn on the lights when you go into a dark area. Replace any light bulbs as soon as they bu

## 2021-08-05 NOTE — Progress Notes (Addendum)
? ?Subjective:  ? Eric [name] is a 76 y.o. male who presents for Medicare Annual/Subsequent preventive examination. ? ?I connected with  [name] on 08/05/21 by a audio enabled telemedicine application and verified that I am speaking with the correct person using two identifiers. ? ?Patient Location: Home ? ?Provider Location: Office/Clinic ? ?I discussed the limitations of evaluation and management by telemedicine. The patient expressed understanding and agreed to proceed.  ? ?Review of Systems    ? ?Cardiac Risk Factors include: advanced age (>36mn, >>67women);hypertension;dyslipidemia ? ?   ?Objective:  ?  ?Today's Vitals  ? 08/05/21 1426  ?PainSc: 1   ? ?There is no height or weight on file to calculate BMI. ? ? ?  08/05/2021  ?  2:24 PM 09/25/2018  ?  8:12 AM 09/21/2018  ?  8:29 AM 03/28/2017  ?  2:36 PM 03/16/2017  ?  8:24 AM 05/14/2016  ? 12:29 PM 04/23/2016  ?  3:59 PM  ?Advanced Directives  ?Does Patient Have a Medical Advance Directive? Yes Yes Yes Yes Yes Yes Yes  ?Type of [place]of [place] of facility DNR (pink MOST or yellow form);Living will Living will Living will [place]ag[place]villeLiving will HTitanicLiving will [place]aybrookLiving will [place]Living will  ?Does patient want to make changes to medical advance directive?  No - Patient declined No - Patient declined No - Patient declined No - Patient declined    ?Copy of [place] Chart? No - copy requested   No - copy requested No - copy requested  No - copy requested  ? ? ?Current Medications (verified) ?Outpatient Encounter Medications as of 08/05/2021  ?Medication Sig  ? aspirin EC 81 MG tablet Take 1 tablet (81 mg total) by mouth 2 (two) times daily. (Patient taking differently: Take 81 mg by mouth once.)  ? atorvastatin (LIPITOR) 40 MG tablet TAKE 1 TABLET DAILY  ? clindamycin (CLEOCIN-T) 1 % lotion Apply topically  2 (two) times daily.  ? Coenzyme Q10 (COQ10) 200 MG CAPS Take 200 mg by mouth at bedtime.  ? fluocinonide ointment (LIDEX) 02.69% Apply 1 application topically daily as needed (for bites).  ? fluticasone (FLONASE) 50 MCG/ACT nasal spray Place 2 sprays into both nostrils daily as needed for allergies.   ? loratadine (CLARITIN) 10 MG tablet Take 10 mg by mouth daily as needed for allergies.   ? meloxicam (MOBIC) 15 MG tablet Take 1 tablet (15 mg total) by mouth daily.  ? metFORMIN (GLUCOPHAGE) 500 MG tablet Take 1 tablet (500 mg total) by mouth daily with breakfast.  ? pantoprazole (PROTONIX) 40 MG tablet TAKE 1 TABLET DAILY  ? tamsulosin (FLOMAX) 0.4 MG CAPS capsule TAKE 1 CAPSULE DAILY  ? tirzepatide ([facility] 5 MG/0.5ML Pen Inject 5 mg into the skin once a week.  ? valsartan (DIOVAN) 80 MG tablet Take 1 tablet (80 mg total) by mouth daily.  ? Vitamin D, Ergocalciferol, (DRISDOL) 1.25 MG (50000 UNIT) CAPS capsule TAKE ONE CAPSULE BY MOUTH EVERY 7 DAYS  ? Zinc 50 MG CAPS Take by mouth daily.  ? [DISCONTINUED] COVID-19 mRNA bivalent vaccine, Pfizer, (PFIZER COVID-19 VAC BIVALENT) injection Inject into the muscle.  ? ?No facility-administered encounter medications on file as of 08/05/2021.  ? ? ?Allergies (verified) ?Lisinopril  ? ?History: ?Past Medical History:  ?Diagnosis Date  ? Alcohol abuse   ? Allergy   ? Back pain   ?  Cataract   ? forming  ? Colon polyps   ? Diverticulosis of colon   ? DJD (degenerative joint disease)   ? Dysrhythmia   ? Palpations from caffiene- reports he was having PACs due to slim fast wth caffeine - had full cardiac w/u and was told to cut back on caffeine;  now drink decaf bevrerages and no rpeort of recurrence   ? Enlarged prostate   ? GERD (gastroesophageal reflux disease)   ? Hearing loss   ? Hip osteoarthritis   ? Left  ? History of hay fever   ? Hx of cardiovascular stress test   ? Lexiscan Myoview (04/2013):  No ischemia, EF 61%; low risk  ? Hyperlipemia   ? Hypertension   ? Insulin  resistance   ? om metfromin   ? Internal hemorrhoids without mention of complication   ? Leg edema   ? Neuroma of foot   ? left  ? Obesity   ? Osteoarthrosis, unspecified whether generalized or localized, unspecified site   ? Palpitations   ? 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  ? Primary localized osteoarthrosis of right shoulder 05/25/2016  ? Recent weight loss   ? 75lbs  ? Seasonal allergies   ? Sleep apnea   ? CPAP  ? Thyroid nodule   ? calcified thyroid nodules- bx negative   ? Tremor of both hands   ? ?Past Surgical History:  ?Procedure Laterality Date  ? COLONOSCOPY    ? POLYPECTOMY    ? SHOULDER SURGERY  07/04/2007  ? right  ? TONSILLECTOMY    ? TOTAL HIP ARTHROPLASTY Left 03/28/2017  ? Procedure: TOTAL HIP ARTHROPLASTY ANTERIOR APPROACH;  Surgeon: Frederik Pear, MD;  Location: [place];  Service: Orthopedics;  Laterality: Left;  ? TOTAL HIP ARTHROPLASTY Right 09/25/2018  ? Procedure: Right Anterior Hip Arthroplasty;  Surgeon: Frederik Pear, MD;  Location: [facility];  Service: Orthopedics;  Laterality: Right;  ? TOTAL SHOULDER ARTHROPLASTY Left 10/14/2015  ? Procedure: LEFT TOTAL SHOULDER ARTHROPLASTY;  Surgeon: Marchia Bond, MD;  Location: [place];  Service: Orthopedics;  Laterality: Left;  ? TOTAL SHOULDER ARTHROPLASTY Right 05/25/2016  ? Procedure: TOTAL SHOULDER ARTHROPLASTY;  Surgeon: Marchia Bond, MD;  Location: [place];  Service: Orthopedics;  Laterality: Right;  ? ?Family History  ?Problem Relation Age of Onset  ? Arthritis Mother   ? Cancer Mother 79  ?     breast  ? Hyperlipidemia Mother   ? Colon polyps Mother 3  ? Hypertension Mother   ? Heart disease Mother   ? Depression Mother   ? Anxiety disorder Mother   ? Obesity Mother   ? Breast cancer Mother   ? Heart disease Father   ?     CAD--passed away 2011-07-04 age 48  ? Hyperlipidemia Father   ? Hypertension Father   ? Obesity Father   ? Diabetes Father   ? Depression Father   ? Sleep apnea Father   ? Cancer Sister 43  ?     breast  ? Breast cancer Sister   ?  Sleep apnea Sister   ? Cancer Brother 58  ?     prostate  ? Prostate cancer Brother   ? Tremor Paternal Grandfather   ? Breast cancer Other   ? Prostate cancer Other   ? Colon cancer Neg Hx   ? Esophageal cancer Neg Hx   ? Rectal cancer Neg Hx   ? Stomach cancer Neg Hx   ? ?Social History  ? ?  Socioeconomic History  ? Marital status: Married  ?  Spouse name: Jordani Nunn   ? Number of children: 3  ? Years of education: Not on file  ? Highest education level: Not on file  ?Occupational History  ? Occupation: Retired--[employer]  ?Tobacco Use  ? Smoking status: Never  ? Smokeless tobacco: Never  ?Vaping Use  ? Vaping Use: Never used  ?Substance and Sexual Activity  ? Alcohol use: Not Currently  ?  Alcohol/week: 1.0 standard drink  ?  Types: 1 Glasses of wine per week  ?  Comment: rare----12 a year  ? Drug use: No  ? Sexual activity: Yes  ?  Partners: Female  ?Other Topics Concern  ? Not on file  ?Social History Narrative  ? Lives with wife and daughter and two grandchildren.   ? ?Social Determinants of Health  ? ?Financial Resource Strain: Low Risk   ? Difficulty of Paying Living Expenses: Not hard at all  ?Food Insecurity: No Food Insecurity  ? Worried About Charity fundraiser in the Last Year: Never true  ? Ran Out of Food in the Last Year: Never true  ?Transportation Needs: No Transportation Needs  ? Lack of Transportation (Medical): No  ? Lack of Transportation (Non-Medical): No  ?Physical Activity: Sufficiently Active  ? Days of Exercise per Week: 7 days  ? Minutes of Exercise per Session: 60 min  ?Stress: No Stress Concern Present  ? Feeling of Stress : Not at all  ?Social Connections: Moderately Integrated  ? Frequency of Communication with Friends and Family: Three times a week  ? Frequency of Social Gatherings with Friends and Family: Twice a week  ? Attends Religious Services: More than 4 times per year  ? Active Member of Clubs or Organizations: No  ? Attends Archivist Meetings:  Never  ? Marital Status: Married  ? ? ?Tobacco Counseling ?Counseling given: Not Answered ? ? ?Clinical Intake: ? ?Pre-visit preparation completed: Yes ? ?Pain : 0-10 ?Pain Score: 1  ?Pain Type: Chronic

## 2021-08-06 MED ORDER — VITAMIN D (ERGOCALCIFEROL) 1.25 MG (50000 UNIT) PO CAPS: ORAL_CAPSULE | ORAL | 1 refills | Status: DC

## 2021-08-06 MED ORDER — METFORMIN HCL 500 MG PO TABS: 500.0000 mg | ORAL_TABLET | Freq: Every day | ORAL | 1 refills | Status: DC

## 2021-08-06 MED ORDER — TIRZEPATIDE 5 MG/0.5ML ~~LOC~~ SOAJ: 5.0000 mg | SUBCUTANEOUS | 0 refills | Status: DC

## 2021-08-10 DIAGNOSIS — M5451 Vertebrogenic low back pain: Secondary | ICD-10-CM | POA: Diagnosis not present

## 2021-08-12 DIAGNOSIS — M5451 Vertebrogenic low back pain: Secondary | ICD-10-CM | POA: Diagnosis not present

## 2021-08-18 NOTE — Progress Notes (Unsigned)
Chief Complaint:   OBESITY Eric [name] is here to discuss his progress with his obesity treatment plan along with follow-up of his obesity related diagnoses. Eric [name] is on the Category 3 Plan and states he is following his eating plan approximately 50% of the time. Eric [name] states he is doing physical therapy for 120 minutes 2 times per week.  Today's visit was #: 83 Starting weight: 288 lbs Starting date: 12/14/2017 Today's weight: 250 lbs Today's date: 08/03/2021 Total lbs lost to date: 38 Total lbs lost since last in-office visit: 0  Interim History: Eric [name] has done well with maintaining his weight. He is doing strengthening and some cardio with physical therapy. He has done more activity overall lately.   Subjective:   1. Type 2 diabetes mellitus with hyperglycemia, without long-term current use of insulin (HCC) Eric [name] is stable on his medications, and he is working on his diet and exercise.   2. Vitamin D deficiency Eric [name] is stable on Vitamin D, and he requests a refill today.  Assessment/Plan:   1. Type 2 diabetes mellitus with hyperglycemia, without long-term current use of insulin (Eric [name]) Eric [name] agreed to start [facility] at 5 mg q week with no refills, and we will refill metformin for 2 months.   - metFORMIN (GLUCOPHAGE) 500 MG tablet; Take 1 tablet (500 mg total) by mouth daily with breakfast.  Dispense: 30 tablet; Refill: 1 - tirzepatide (MOUNJARO) 5 MG/0.5ML Pen; Inject 5 mg into the skin once a week.  Dispense: 6 mL; Refill: 0  2. Vitamin D deficiency We will refill prescription Vitamin D for 2 months. Eric [name] will follow-up for routine testing of Vitamin D, at least 2-3 times per year to avoid over-replacement.  - Vitamin D, Ergocalciferol, (DRISDOL) 1.25 MG (50000 UNIT) CAPS capsule; TAKE ONE CAPSULE BY MOUTH EVERY 7 DAYS  Dispense: 4 capsule; Refill: 1  3. Obesity, Current BMI 36.9 Eric [name] is currently in the action stage of change. As such, his goal is to  continue with weight loss efforts. He has agreed to the Category 3 Plan.   Exercise goals: As is.  Behavioral modification strategies: increasing lean protein intake.  Eric [name] has agreed to follow-up with our clinic in 4 weeks. He was informed of the importance of frequent follow-up visits to maximize his success with intensive lifestyle modifications for his multiple health conditions.   Objective:   Blood pressure 116/68, pulse 62, temperature 97.9 F (36.6 C), height '5\' 9"'$  (1.753 m), weight 250 lb (113.4 kg), SpO2 97 %. Body mass index is 36.92 kg/m.  General: Cooperative, alert, well developed, in no acute distress. HEENT: Conjunctivae and lids unremarkable. Cardiovascular: Regular rhythm.  Lungs: Normal work of breathing. Neurologic: No focal deficits.   Lab Results  Component Value Date   CREATININE 1.02 03/05/2021   BUN 21 03/05/2021   NA 139 03/05/2021   K 4.3 03/05/2021   CL 104 03/05/2021   CO2 29 03/05/2021   Lab Results  Component Value Date   ALT 22 03/05/2021   AST 13 03/05/2021   ALKPHOS 51 03/05/2021   BILITOT 0.8 03/05/2021   Lab Results  Component Value Date   HGBA1C 5.7 03/05/2021   HGBA1C 5.6 09/01/2020   HGBA1C 5.4 03/03/2020   HGBA1C 5.6 10/22/2019   HGBA1C 5.3 06/18/2019   Lab Results  Component Value Date   INSULIN 9.0 10/22/2019   INSULIN 21.3 12/14/2017   Lab Results  Component Value Date   TSH 1.060 12/14/2017   Lab Results  Component Value Date   CHOL 115 03/05/2021   HDL 49.30 03/05/2021   LDLCALC 55 03/05/2021   TRIG 53.0 03/05/2021   CHOLHDL 2 03/05/2021   Lab Results  Component Value Date   VD25OH 43.17 03/05/2021   VD25OH 44.50 09/01/2020   VD25OH 49.51 03/03/2020   Lab Results  Component Value Date   WBC 5.4 03/05/2021   HGB 14.7 03/05/2021   HCT 44.2 03/05/2021   MCV 92.3 03/05/2021   PLT 163.0 03/05/2021   No results found for: IRON, TIBC, FERRITIN  Attestation Statements:   Reviewed by clinician on  day of visit: allergies, medications, problem list, medical history, surgical history, family history, social history, and previous encounter notes.  Time spent on visit including pre-visit chart review and post-visit care and charting was 35 minutes.    I, Trixie Dredge, am acting as transcriptionist for [name], MD.  I have reviewed the above documentation for accuracy and completeness, and I agree with the above. -  [name], MD

## 2021-08-21 DIAGNOSIS — M5451 Vertebrogenic low back pain: Secondary | ICD-10-CM | POA: Diagnosis not present

## 2021-08-24 DIAGNOSIS — M5451 Vertebrogenic low back pain: Secondary | ICD-10-CM | POA: Diagnosis not present

## 2021-08-26 DIAGNOSIS — M5451 Vertebrogenic low back pain: Secondary | ICD-10-CM | POA: Diagnosis not present

## 2021-08-31 DIAGNOSIS — M5451 Vertebrogenic low back pain: Secondary | ICD-10-CM | POA: Diagnosis not present

## 2021-09-01 ENCOUNTER — Other Ambulatory Visit (HOSPITAL_COMMUNITY): Payer: Self-pay

## 2021-09-01 ENCOUNTER — Encounter (INDEPENDENT_AMBULATORY_CARE_PROVIDER_SITE_OTHER): Payer: Self-pay | Admitting: Family Medicine

## 2021-09-01 ENCOUNTER — Ambulatory Visit (INDEPENDENT_AMBULATORY_CARE_PROVIDER_SITE_OTHER): Payer: Medicare Other | Admitting: Family Medicine

## 2021-09-01 VITALS — BP 119/70 | HR 64 | Temp 98.3°F | Ht 69.0 in | Wt 251.0 lb

## 2021-09-01 DIAGNOSIS — Z7985 Long-term (current) use of injectable non-insulin antidiabetic drugs: Secondary | ICD-10-CM | POA: Diagnosis not present

## 2021-09-01 DIAGNOSIS — Z7984 Long term (current) use of oral hypoglycemic drugs: Secondary | ICD-10-CM | POA: Diagnosis not present

## 2021-09-01 DIAGNOSIS — E1165 Type 2 diabetes mellitus with hyperglycemia: Secondary | ICD-10-CM | POA: Diagnosis not present

## 2021-09-01 DIAGNOSIS — E669 Obesity, unspecified: Secondary | ICD-10-CM | POA: Diagnosis not present

## 2021-09-01 DIAGNOSIS — Z6837 Body mass index (BMI) 37.0-37.9, adult: Secondary | ICD-10-CM

## 2021-09-01 DIAGNOSIS — E559 Vitamin D deficiency, unspecified: Secondary | ICD-10-CM | POA: Diagnosis not present

## 2021-09-01 MED ORDER — VITAMIN D (ERGOCALCIFEROL) 1.25 MG (50000 UNIT) PO CAPS: ORAL_CAPSULE | ORAL | 0 refills | Status: DC

## 2021-09-01 MED ORDER — TIRZEPATIDE 5 MG/0.5ML ~~LOC~~ SOAJ
5.0000 mg | SUBCUTANEOUS | 0 refills | Status: DC
  Filled 2021-09-01: qty 2, 28d supply, fill #0

## 2021-09-01 MED ORDER — METFORMIN HCL 500 MG PO TABS: 500.0000 mg | ORAL_TABLET | Freq: Every day | ORAL | 0 refills | Status: DC

## 2021-09-02 DIAGNOSIS — M5451 Vertebrogenic low back pain: Secondary | ICD-10-CM | POA: Diagnosis not present

## 2021-09-02 NOTE — Progress Notes (Signed)
Chief Complaint:   OBESITY Eric [name] is here to discuss his progress with his obesity treatment plan along with follow-up of his obesity related diagnoses. Eric [name] is on the Category 3 Plan and states he is following his eating plan approximately 50% of the time. Eric [name] states he is doing physical therapy for 90 minutes 2 times per week.  Today's visit was #: 77 Starting weight: 288 lbs Starting date: 12/14/2017 Today's weight: 251 lbs Today's date: 09/01/2021 Total lbs lost to date: 37 Total lbs lost since last in-office visit: 0  Interim History: Eric [name] has struggled more with eating out. He is doing physical therpay, and has increased his exercise. He continues to work on getting back on track.   Subjective:   1. Type 2 diabetes mellitus with hyperglycemia, without long-term current use of insulin (HCC) Eric [name] is not able to take Ozempic and not controlled enough with metformin.  He was unable to start [facility] due to [employer] shortage.  2. Vitamin D deficiency Eric [name] is on vitamin D, with no side effects noted.  He requests a refill today.  Assessment/Plan:   1. Type 2 diabetes mellitus with hyperglycemia, without long-term current use of insulin (HCC) We will refill metformin 500 mg daily with breakfast for 1 month.  We will send Mounjaro 5 mg to [facility].  - metFORMIN (GLUCOPHAGE) 500 MG tablet; Take 1 tablet (500 mg total) by mouth daily with breakfast.  Dispense: 30 tablet; Refill: 0 - tirzepatide (MOUNJARO) 5 MG/0.5ML Pen; Inject 5 mg into the skin once a week.  Dispense: 2 mL; Refill: 0  2. Vitamin D deficiency We will refill prescription vitamin D 50,000 units every 7 days for 1 month.  - Vitamin D, Ergocalciferol, (DRISDOL) 1.25 MG (50000 UNIT) CAPS capsule; TAKE ONE CAPSULE BY MOUTH EVERY 7 DAYS  Dispense: 4 capsule; Refill: 0  3. Obesity, Current BMI 37.1 Eric [name] is currently in the action stage of change. As such, his goal is to continue  with weight loss efforts. He has agreed to the Category 3 Plan.   Exercise goals: As is.  Behavioral modification strategies: increasing lean protein intake.  Eric [name] has agreed to follow-up with our clinic in 4 weeks. He was informed of the importance of frequent follow-up visits to maximize his success with intensive lifestyle modifications for his multiple health conditions.   Objective:   Blood pressure 119/70, pulse 64, temperature 98.3 F (36.8 C), height '5\' 9"'$  (1.753 m), weight 251 lb (113.9 kg), SpO2 98 %. Body mass index is 37.07 kg/m.  General: Cooperative, alert, well developed, in no acute distress. HEENT: Conjunctivae and lids unremarkable. Cardiovascular: Regular rhythm.  Lungs: Normal work of breathing. Neurologic: No focal deficits.   Lab Results  Component Value Date   CREATININE 1.02 03/05/2021   BUN 21 03/05/2021   NA 139 03/05/2021   K 4.3 03/05/2021   CL 104 03/05/2021   CO2 29 03/05/2021   Lab Results  Component Value Date   ALT 22 03/05/2021   AST 13 03/05/2021   ALKPHOS 51 03/05/2021   BILITOT 0.8 03/05/2021   Lab Results  Component Value Date   HGBA1C 5.7 03/05/2021   HGBA1C 5.6 09/01/2020   HGBA1C 5.4 03/03/2020   HGBA1C 5.6 10/22/2019   HGBA1C 5.3 06/18/2019   Lab Results  Component Value Date   INSULIN 9.0 10/22/2019   INSULIN 21.3 12/14/2017   Lab Results  Component Value Date   TSH 1.060 12/14/2017   Lab  Results  Component Value Date   CHOL 115 03/05/2021   HDL 49.30 03/05/2021   LDLCALC 55 03/05/2021   TRIG 53.0 03/05/2021   CHOLHDL 2 03/05/2021   Lab Results  Component Value Date   VD25OH 43.17 03/05/2021   VD25OH 44.50 09/01/2020   VD25OH 49.51 03/03/2020   Lab Results  Component Value Date   WBC 5.4 03/05/2021   HGB 14.7 03/05/2021   HCT 44.2 03/05/2021   MCV 92.3 03/05/2021   PLT 163.0 03/05/2021   No results found for: "IRON", "TIBC", "FERRITIN"  Attestation Statements:   Reviewed by clinician on day  of visit: allergies, medications, problem list, medical history, surgical history, family history, social history, and previous encounter notes.   I, Trixie Dredge, am acting as transcriptionist for [name], MD.  I have reviewed the above documentation for accuracy and completeness, and I agree with the above. -  [name], MD

## 2021-09-03 ENCOUNTER — Other Ambulatory Visit (INDEPENDENT_AMBULATORY_CARE_PROVIDER_SITE_OTHER): Payer: Self-pay | Admitting: Family Medicine

## 2021-09-03 ENCOUNTER — Encounter (INDEPENDENT_AMBULATORY_CARE_PROVIDER_SITE_OTHER): Payer: Self-pay | Admitting: Family Medicine

## 2021-09-03 ENCOUNTER — Other Ambulatory Visit (HOSPITAL_COMMUNITY): Payer: Self-pay

## 2021-09-03 ENCOUNTER — Ambulatory Visit (INDEPENDENT_AMBULATORY_CARE_PROVIDER_SITE_OTHER): Payer: Medicare Other | Admitting: Family Medicine

## 2021-09-03 ENCOUNTER — Encounter: Payer: Self-pay | Admitting: Family Medicine

## 2021-09-03 VITALS — BP 120/70 | HR 50 | Temp 98.2°F | Resp 18 | Ht 69.0 in | Wt 256.4 lb

## 2021-09-03 DIAGNOSIS — Z6841 Body Mass Index (BMI) 40.0 and over, adult: Secondary | ICD-10-CM

## 2021-09-03 DIAGNOSIS — G252 Other specified forms of tremor: Secondary | ICD-10-CM | POA: Diagnosis not present

## 2021-09-03 DIAGNOSIS — I10 Essential (primary) hypertension: Secondary | ICD-10-CM

## 2021-09-03 DIAGNOSIS — Z0000 Encounter for general adult medical examination without abnormal findings: Secondary | ICD-10-CM | POA: Diagnosis not present

## 2021-09-03 DIAGNOSIS — E785 Hyperlipidemia, unspecified: Secondary | ICD-10-CM

## 2021-09-03 DIAGNOSIS — E1165 Type 2 diabetes mellitus with hyperglycemia: Secondary | ICD-10-CM

## 2021-09-03 DIAGNOSIS — G4733 Obstructive sleep apnea (adult) (pediatric): Secondary | ICD-10-CM | POA: Diagnosis not present

## 2021-09-03 LAB — LIPID PANEL
Cholesterol: 121 mg/dL (ref 0–200)
HDL: 47.4 mg/dL (ref >39.00)
LDL Cholesterol: 61 mg/dL (ref 0–99)
NonHDL: 73.53
Total CHOL/HDL Ratio: 3
Triglycerides: 61 mg/dL (ref 0.0–149.0)
VLDL: 12.2 mg/dL (ref 0.0–40.0)

## 2021-09-03 LAB — COMPREHENSIVE METABOLIC PANEL
ALT: 14 U/L (ref 0–53)
AST: 13 U/L (ref 0–37)
Albumin: 4.3 g/dL (ref 3.5–5.2)
Alkaline Phosphatase: 52 U/L (ref 39–117)
BUN: 18 mg/dL (ref 6–23)
CO2: 29 mEq/L (ref 19–32)
Calcium: 9.3 mg/dL (ref 8.4–10.5)
Chloride: 105 mEq/L (ref 96–112)
Creatinine, Ser: 0.99 mg/dL (ref 0.40–1.50)
GFR: 74.6 mL/min (ref >60.00)
Glucose, Bld: 94 mg/dL (ref 70–99)
Potassium: 4.8 mEq/L (ref 3.5–5.1)
Sodium: 141 mEq/L (ref 135–145)
Total Bilirubin: 0.9 mg/dL (ref 0.2–1.2)
Total Protein: 6.4 g/dL (ref 6.0–8.3)

## 2021-09-03 LAB — CBC WITH DIFFERENTIAL/PLATELET
Basophils Absolute: 0 10*3/uL (ref 0.0–0.1)
Basophils Relative: 0.4 % (ref 0.0–3.0)
Eosinophils Absolute: 0.1 10*3/uL (ref 0.0–0.7)
Eosinophils Relative: 1.4 % (ref 0.0–5.0)
HCT: 45.9 % (ref 39.0–52.0)
Hemoglobin: 15.1 g/dL (ref 13.0–17.0)
Lymphocytes Relative: 24.5 % (ref 12.0–46.0)
Lymphs Abs: 1.4 10*3/uL (ref 0.7–4.0)
MCHC: 32.9 g/dL (ref 30.0–36.0)
MCV: 92.1 fl (ref 78.0–100.0)
Monocytes Absolute: 0.6 10*3/uL (ref 0.1–1.0)
Monocytes Relative: 11.1 % (ref 3.0–12.0)
Neutro Abs: 3.5 10*3/uL (ref 1.4–7.7)
Neutrophils Relative %: 62.6 % (ref 43.0–77.0)
Platelets: 157 10*3/uL (ref 150.0–400.0)
RBC: 4.99 Mil/uL (ref 4.22–5.81)
RDW: 14.3 % (ref 11.5–15.5)
WBC: 5.6 10*3/uL (ref 4.0–10.5)

## 2021-09-03 LAB — HEMOGLOBIN A1C: Hgb A1c MFr Bld: 5.7 % (ref 4.6–6.5)

## 2021-09-03 LAB — VITAMIN D 25 HYDROXY (VIT D DEFICIENCY, FRACTURES): VITD: 66.83 ng/mL (ref 30.00–100.00)

## 2021-09-03 MED ORDER — TIRZEPATIDE 5 MG/0.5ML ~~LOC~~ SOAJ
5.0000 mg | SUBCUTANEOUS | 1 refills | Status: DC
  Filled 2021-09-03: qty 2, 28d supply, fill #0
  Filled 2021-09-26: qty 2, 28d supply, fill #1
  Filled 2021-10-09: qty 2, 28d supply, fill #2

## 2021-09-03 MED ORDER — ATORVASTATIN CALCIUM 40 MG PO TABS: 40.0000 mg | ORAL_TABLET | Freq: Every day | ORAL | 3 refills | Status: DC

## 2021-09-03 NOTE — Assessment & Plan Note (Signed)
Well controlled, no changes to meds. Encouraged heart healthy diet such as the DASH diet and exercise as tolerated.  °

## 2021-09-03 NOTE — Assessment & Plan Note (Signed)
Encourage heart healthy diet such as MIND or DASH diet, increase exercise, avoid trans fats, simple carbohydrates and processed foods, consider a krill or fish or flaxseed oil cap daily.  °

## 2021-09-03 NOTE — Assessment & Plan Note (Signed)
con't with [facility] mounjaro 2.5 weekly sample given and 0.5 sent to pharmacy F/u [facility]

## 2021-09-03 NOTE — Progress Notes (Signed)
Established Patient Office Visit  Subjective   Patient ID: Eric [name], male    DOB: Jul 13, 1945  Age: 76 y.o. MRN: [id number]  Chief Complaint  Patient presents with   Annual Exam    Pt states fasting    HPI  Patient Active Problem List   Diagnosis Date Noted   Bronchitis 02/03/2021   Depression 04/02/2020   Viral upper respiratory tract infection 08/67/6195   Folliculitis 09/32/6712   Preventative health care 03/02/2019   Morbid obesity ([place]) 03/02/2019   Hypotension 10/11/2018   Status post total hip replacement, right 09/25/2018   Osteoarthritis of right hip 09/21/2018   Prediabetes 08/29/2018   Vitamin D deficiency 08/29/2018   Primary osteoarthritis of right hip 08/29/2018   Thyroid nodule 05/12/2017   Primary osteoarthritis of left hip 03/28/2017   Status post total replacement of left hip 03/28/2017   Osteoarthritis of left hip 03/25/2017   Essential hypertension 11/18/2016   Hyperlipidemia LDL goal <100 11/18/2016   Need for prophylactic vaccination and inoculation against influenza 11/18/2016   Primary localized osteoarthrosis of right shoulder 05/25/2016   History of total replacement of right shoulder joint 05/25/2016   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   S/P shoulder replacement 10/14/2015   Low back pain 09/03/2015   Left foot pain 04/04/2014   Left shoulder pain 02/20/2014   Palpitations 12/25/2012   Left hip pain 12/25/2012   Class 3 severe obesity with serious comorbidity and body mass index (BMI) of 40.0 to 44.9 in adult [facility]) 12/25/2012   Acute bronchitis 12/25/2012   Urgency of urination 12/24/2011   Urinary frequency 12/24/2011   Pre-op evaluation 10/08/2010   Hyperlipidemia 10/08/2010   DECREASED HEARING 09/04/2009   INTERNAL HEMORRHOIDS WITHOUT MENTION COMP 09/04/2009   ALLERGIC RHINITIS, SEASONAL 09/04/2009   GERD 09/04/2009   DIVERTICULOSIS, COLON 09/04/2009   OSTEOARTHRITIS 09/04/2009   Sleep apnea 09/04/2009    Past Medical History:  Diagnosis Date   Alcohol abuse    Allergy    Back pain    Cataract    forming   Colon polyps    Diverticulosis of colon    DJD (degenerative joint disease)    Dysrhythmia    Palpations from caffiene- reports he was having PACs due to slim fast wth caffeine - had full cardiac w/u and was told to cut back on caffeine;  now drink decaf bevrerages and no rpeort of recurrence    Enlarged prostate    GERD (gastroesophageal reflux disease)    Hearing loss    Hip osteoarthritis    Left   History of hay fever    Hx of cardiovascular stress test    Lexiscan Myoview (04/2013):  No ischemia, EF 61%; low risk   Hyperlipemia    Hypertension    Insulin resistance    om metfromin    Internal hemorrhoids without mention of complication    Leg edema    Neuroma of foot    left   Obesity    Osteoarthrosis, unspecified whether generalized or localized, unspecified site    Palpitations    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   Primary localized osteoarthrosis of right shoulder 05/25/2016   Recent weight loss    75lbs   Seasonal allergies    Sleep apnea    CPAP   Thyroid nodule    calcified thyroid nodules- bx negative    Tremor of both hands    Past Surgical History:  Procedure Laterality Date  COLONOSCOPY     POLYPECTOMY     SHOULDER SURGERY  2009   right   TONSILLECTOMY     TOTAL HIP ARTHROPLASTY Left 03/28/2017   Procedure: TOTAL HIP ARTHROPLASTY ANTERIOR APPROACH;  Surgeon: Frederik Pear, MD;  Location: [place];  Service: Orthopedics;  Laterality: Left;   TOTAL HIP ARTHROPLASTY Right 09/25/2018   Procedure: Right Anterior Hip Arthroplasty;  Surgeon: Frederik Pear, MD;  Location: [facility];  Service: Orthopedics;  Laterality: Right;   TOTAL SHOULDER ARTHROPLASTY Left 10/14/2015   Procedure: LEFT TOTAL SHOULDER ARTHROPLASTY;  Surgeon: Marchia Bond, MD;  Location: [place];  Service: Orthopedics;  Laterality: Left;   TOTAL SHOULDER ARTHROPLASTY Right  05/25/2016   Procedure: TOTAL SHOULDER ARTHROPLASTY;  Surgeon: Marchia Bond, MD;  Location: [place];  Service: Orthopedics;  Laterality: Right;   Social History   Tobacco Use   Smoking status: Never   Smokeless tobacco: Never  Vaping Use   Vaping Use: Never used  Substance Use Topics   Alcohol use: Not Currently    Alcohol/week: 1.0 standard drink of alcohol    Types: 1 Glasses of wine per week    Comment: rare----12 a year   Drug use: No   Social History   Socioeconomic History   Marital status: Married    Spouse name: Phillipe Clemon    Number of children: 3   Years of education: Not on file   Highest education level: Not on file  Occupational History   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    Social Determinants of Health   Financial Resource Strain: Low Risk  (08/05/2021)   Overall Financial Resource Strain (CARDIA)    Difficulty of Paying Living Expenses: Not hard at all  Food Insecurity: No Food Insecurity (08/05/2021)   Hunger Vital Sign    Worried About Running Out of Food in the Last Year: Never true    Ran Out of Food in the Last Year: Never true  Transportation Needs: No Transportation Needs (08/05/2021)   PRAPARE - Hydrologist (Medical): No    Lack of Transportation (Non-Medical): No  Physical Activity: Sufficiently Active (08/05/2021)   Exercise Vital Sign    Days of Exercise per Week: 7 days    Minutes of Exercise per Session: 60 min  Stress: No Stress Concern Present  (08/05/2021)   [place]    Feeling of Stress : Not at all  Social Connections: Moderately Integrated (08/05/2021)   Social Connection and Isolation Panel [NHANES]    Frequency of Communication with Friends and Family: Three times a week    Frequency of Social Gatherings with Friends and Family: Twice a week    Attends Religious Services: More than 4 times per year    Active Member of [employer] or Organizations: No    Attends Archivist Meetings: Never    Marital Status: Married  Intimate  Partner Violence: Not At Risk (08/05/2021)   Humiliation, Afraid, Rape, and Kick questionnaire    Fear of Current or Ex-Partner: No    Emotionally Abused: No    Physically Abused: No    Sexually Abused: No   Family Status  Relation Name Status   Mother  Deceased   Father  Deceased at age 76       heart   Sister  Alive   Brother  Alive   PGF  (Not Specified)   Other  (Not Specified)   Neg Hx  (Not Specified)   Family History  Problem Relation Age of Onset   Arthritis Mother    Cancer Mother 68       breast   Hyperlipidemia Mother    Colon polyps Mother 64   Hypertension Mother    Heart disease Mother    Depression Mother    Anxiety disorder Mother    Obesity Mother    Breast cancer Mother    Heart disease Father        CAD--passed away 06-25-11 age 68   Hyperlipidemia Father    Hypertension Father    Obesity Father    Diabetes Father    Depression Father    Sleep apnea Father    Cancer Sister 73       breast   Breast cancer Sister    Sleep apnea Sister    Cancer Brother 72       prostate   Prostate cancer Brother    Tremor Paternal Grandfather    Breast cancer Other    Prostate cancer Other    Colon cancer Neg Hx    Esophageal cancer Neg Hx    Rectal cancer Neg Hx    Stomach cancer Neg Hx       ROS    Objective:     BP 120/70 (BP Location: Left Arm, Patient Position: Sitting, Cuff Size: Large)    Pulse (!) 50   Temp 98.2 F (36.8 C) (Oral)   Resp 18   Ht '5\' 9"'$  (1.753 m)   Wt 256 lb 6.4 oz (116.3 kg)   SpO2 96%   BMI 37.86 kg/m  BP Readings from Last 3 Encounters:  09/03/21 120/70  09/01/21 119/70  08/03/21 116/68   Wt Readings from Last 3 Encounters:  09/03/21 256 lb 6.4 oz (116.3 kg)  09/01/21 251 lb (113.9 kg)  08/03/21 250 lb (113.4 kg)   SpO2 Readings from Last 3 Encounters:  09/03/21 96%  09/01/21 98%  08/03/21 97%      Physical Exam   No results found for any visits on 09/03/21.  Last CBC Lab Results  Component Value Date   WBC 5.4 03/05/2021   HGB 14.7 03/05/2021   HCT 44.2 03/05/2021   MCV 92.3 03/05/2021   MCH 30.6 09/26/2018   RDW 14.1 03/05/2021   PLT 163.0 42/35/3614   Last metabolic panel Lab Results  Component Value Date   GLUCOSE 84 03/05/2021   NA 139 03/05/2021   K 4.3 03/05/2021   CL 104 03/05/2021   CO2 29 03/05/2021   BUN 21 03/05/2021   CREATININE 1.02 03/05/2021   GFRNONAA 64 10/22/2019   CALCIUM 9.0 03/05/2021   PROT 5.9 (L) 03/05/2021   ALBUMIN 3.9 03/05/2021   LABGLOB 2.2 10/22/2019   AGRATIO 2.0 10/22/2019   BILITOT 0.8 03/05/2021   ALKPHOS 51 03/05/2021   AST 13 03/05/2021   ALT 22 03/05/2021   ANIONGAP 4 (L) 09/26/2018  Last lipids Lab Results  Component Value Date   CHOL 115 03/05/2021   HDL 49.30 03/05/2021   LDLCALC 55 03/05/2021   TRIG 53.0 03/05/2021   CHOLHDL 2 03/05/2021   Last hemoglobin A1c Lab Results  Component Value Date   HGBA1C 5.7 03/05/2021   Last thyroid functions Lab Results  Component Value Date   TSH 1.060 12/14/2017   T3TOTAL 127 12/14/2017   T4TOTAL 6.9 10/03/2015   Last vitamin D Lab Results  Component Value Date   VD25OH 43.17 03/05/2021   Last vitamin B12 and Folate Lab Results  Component Value Date   [id number] 03/03/2020      The ASCVD Risk score (Arnett DK, et al., 2019) failed to calculate for the following reasons:   The valid total cholesterol range  is 130 to 320 mg/dL    Assessment & Plan:   Problem List Items Addressed This Visit       Unprioritized   Class 3 severe obesity with serious comorbidity and body mass index (BMI) of 40.0 to 44.9 in adult [facility])   Relevant Medications   tirzepatide (MOUNJARO) 5 MG/0.5ML Pen   Other Relevant Orders   CBC with Differential/Platelet   Comprehensive metabolic panel   Hemoglobin A1c   Lipid panel   VITAMIN D 25 Hydroxy (Vit-D Deficiency, Fractures)   Insulin, random   Preventative health care    ghm utd Check labs  See avs       Hyperlipidemia LDL goal <100    Encourage heart healthy diet such as MIND or DASH diet, increase exercise, avoid trans fats, simple carbohydrates and processed foods, consider a krill or fish or flaxseed oil cap daily.       Relevant Medications   atorvastatin (LIPITOR) 40 MG tablet   Hyperlipidemia - Primary    Encourage heart healthy diet such as MIND or DASH diet, increase exercise, avoid trans fats, simple carbohydrates and processed foods, consider a krill or fish or flaxseed oil cap daily.       Relevant Medications   atorvastatin (LIPITOR) 40 MG tablet   Other Relevant Orders   CBC with Differential/Platelet   Comprehensive metabolic panel   Hemoglobin A1c   Lipid panel   VITAMIN D 25 Hydroxy (Vit-D Deficiency, Fractures)   Insulin, random   Essential hypertension    Well controlled, no changes to meds. Encouraged heart healthy diet such as the DASH diet and exercise as tolerated.       Relevant Medications   atorvastatin (LIPITOR) 40 MG tablet   Other Relevant Orders   CBC with Differential/Platelet   Comprehensive metabolic panel   Hemoglobin A1c   Lipid panel   VITAMIN D 25 Hydroxy (Vit-D Deficiency, Fractures)   Insulin, random   Other Visit Diagnoses     Type 2 diabetes mellitus with hyperglycemia, without long-term current use of insulin (HCC)       Relevant Medications   atorvastatin (LIPITOR) 40 MG tablet   tirzepatide  (MOUNJARO) 5 MG/0.5ML Pen   Other Relevant Orders   CBC with Differential/Platelet   Comprehensive metabolic panel   Hemoglobin A1c   Lipid panel   VITAMIN D 25 Hydroxy (Vit-D Deficiency, Fractures)   Insulin, random   Primary hypertension       Relevant Medications   atorvastatin (LIPITOR) 40 MG tablet   Other Relevant Orders   CBC with Differential/Platelet   Comprehensive metabolic panel   Hemoglobin A1c   Lipid panel   VITAMIN D 25 Hydroxy (  Vit-D Deficiency, Fractures)   Insulin, random   Action tremor       Relevant Orders   CBC with Differential/Platelet   Comprehensive metabolic panel   Hemoglobin A1c   Lipid panel   VITAMIN D 25 Hydroxy (Vit-D Deficiency, Fractures)   Insulin, random   Obstructive sleep apnea       Relevant Orders   CBC with Differential/Platelet   Comprehensive metabolic panel   Hemoglobin A1c   Lipid panel   VITAMIN D 25 Hydroxy (Vit-D Deficiency, Fractures)   Insulin, random       Return in about 6 months (around 03/05/2022), or if symptoms worsen or fail to improve, for hypertension, hyperlipidemia, diabetes II.    [name], DO

## 2021-09-03 NOTE — Assessment & Plan Note (Signed)
ghm utd Check labs  See avs  

## 2021-09-03 NOTE — Telephone Encounter (Signed)
Dr.Beasley 

## 2021-09-03 NOTE — Patient Instructions (Signed)

## 2021-09-04 ENCOUNTER — Other Ambulatory Visit (HOSPITAL_COMMUNITY): Payer: Self-pay

## 2021-09-04 LAB — INSULIN, RANDOM: Insulin: 14.4 u[IU]/mL

## 2021-09-08 ENCOUNTER — Other Ambulatory Visit (HOSPITAL_COMMUNITY): Payer: Self-pay

## 2021-09-16 DIAGNOSIS — H52203 Unspecified astigmatism, bilateral: Secondary | ICD-10-CM | POA: Diagnosis not present

## 2021-09-16 DIAGNOSIS — Z7984 Long term (current) use of oral hypoglycemic drugs: Secondary | ICD-10-CM | POA: Diagnosis not present

## 2021-09-16 DIAGNOSIS — H524 Presbyopia: Secondary | ICD-10-CM | POA: Diagnosis not present

## 2021-09-16 DIAGNOSIS — H2513 Age-related nuclear cataract, bilateral: Secondary | ICD-10-CM | POA: Diagnosis not present

## 2021-09-16 DIAGNOSIS — H5203 Hypermetropia, bilateral: Secondary | ICD-10-CM | POA: Diagnosis not present

## 2021-09-16 DIAGNOSIS — E119 Type 2 diabetes mellitus without complications: Secondary | ICD-10-CM | POA: Diagnosis not present

## 2021-09-16 DIAGNOSIS — H25013 Cortical age-related cataract, bilateral: Secondary | ICD-10-CM | POA: Diagnosis not present

## 2021-09-18 ENCOUNTER — Encounter: Payer: Self-pay | Admitting: Family Medicine

## 2021-09-21 MED ORDER — VALSARTAN 80 MG PO TABS: 80.0000 mg | ORAL_TABLET | Freq: Every day | ORAL | 1 refills | Status: DC

## 2021-09-28 ENCOUNTER — Ambulatory Visit (INDEPENDENT_AMBULATORY_CARE_PROVIDER_SITE_OTHER): Payer: Medicare Other | Admitting: Family Medicine

## 2021-09-28 ENCOUNTER — Encounter (INDEPENDENT_AMBULATORY_CARE_PROVIDER_SITE_OTHER): Payer: Self-pay | Admitting: Family Medicine

## 2021-09-28 ENCOUNTER — Other Ambulatory Visit (HOSPITAL_COMMUNITY): Payer: Self-pay

## 2021-09-28 VITALS — BP 135/82 | HR 61 | Temp 98.1°F | Ht 69.0 in | Wt 244.0 lb

## 2021-09-28 DIAGNOSIS — Z794 Long term (current) use of insulin: Secondary | ICD-10-CM | POA: Diagnosis not present

## 2021-09-28 DIAGNOSIS — Z7985 Long-term (current) use of injectable non-insulin antidiabetic drugs: Secondary | ICD-10-CM

## 2021-09-28 DIAGNOSIS — E785 Hyperlipidemia, unspecified: Secondary | ICD-10-CM | POA: Diagnosis not present

## 2021-09-28 DIAGNOSIS — E119 Type 2 diabetes mellitus without complications: Secondary | ICD-10-CM | POA: Insufficient documentation

## 2021-09-28 DIAGNOSIS — E1165 Type 2 diabetes mellitus with hyperglycemia: Secondary | ICD-10-CM | POA: Insufficient documentation

## 2021-09-28 DIAGNOSIS — Z6836 Body mass index (BMI) 36.0-36.9, adult: Secondary | ICD-10-CM

## 2021-09-28 DIAGNOSIS — E669 Obesity, unspecified: Secondary | ICD-10-CM

## 2021-09-28 DIAGNOSIS — E1169 Type 2 diabetes mellitus with other specified complication: Secondary | ICD-10-CM

## 2021-09-28 MED ORDER — MELOXICAM 15 MG PO TABS: 15.0000 mg | ORAL_TABLET | Freq: Every day | ORAL | 1 refills | Status: DC

## 2021-09-29 NOTE — Progress Notes (Signed)
Chief Complaint:   OBESITY Eric [name] is here to discuss his progress with his obesity treatment plan along with follow-up of his obesity related diagnoses. [name] is on the Category 3 Plan and states he is following his eating plan approximately 66% of the time. [name] states he is doing physical therapy for 120 minutes 3 times per week.  Today's visit was #: 20 Starting weight: 288 lbs Starting date: 12/14/2017 Today's weight: 244 lbs Today's date: 09/28/2021 Total lbs lost to date: 44 Total lbs lost since last in-office visit: 7  Interim History: Eric [name] has done well with weight loss since his last visit.  His hunger has improved and he is working on portion control and Psychologist, counselling.  Subjective:   1. Hyperlipidemia, unspecified hyperlipidemia type [name] is working on decreasing cholesterol in his diet.  He denies chest pain or myalgias on Lipitor.  I discussed labs with the patient today.  2. Type 2 diabetes mellitus with other specified complication, with long-term current use of insulin (HCC) [name] has done well with diet, exercise, and weight loss.  He notes decreased polyphagia.  He got a sample of Mounjaro from Dr. Etter Sjogren.  I discussed labs with the patient today.  Assessment/Plan:   1. Hyperlipidemia, unspecified hyperlipidemia type [name] will continue with his diet, exercise, and weight loss.  We will continue to follow.  2. Type 2 diabetes mellitus with other specified complication, with long-term current use of insulin ([place]) [name] will continue Mounjaro 5 mg once weekly, and we will follow-up at his next visit.  3. Obesity, Current BMI 36.2 [name] is currently in the action stage of change. As such, his goal is to continue with weight loss efforts. He has agreed to the Category 3 Plan.   Exercise goals: As is.   Behavioral modification strategies: increasing lean protein intake.  [name] has agreed to follow-up with our clinic in 4 weeks. He  was informed of the importance of frequent follow-up visits to maximize his success with intensive lifestyle modifications for his multiple health conditions.   Objective:   Blood pressure 135/82, pulse 61, temperature 98.1 F (36.7 C), height '5\' 9"'$  (1.753 m), weight 244 lb (110.7 kg), SpO2 99 %. Body mass index is 36.03 kg/m.  General: Cooperative, alert, well developed, in no acute distress. HEENT: Conjunctivae and lids unremarkable. Cardiovascular: Regular rhythm.  Lungs: Normal work of breathing. Neurologic: No focal deficits.   Lab Results  Component Value Date   CREATININE 0.99 09/03/2021   BUN 18 09/03/2021   NA 141 09/03/2021   K 4.8 09/03/2021   CL 105 09/03/2021   CO2 29 09/03/2021   Lab Results  Component Value Date   ALT 14 09/03/2021   AST 13 09/03/2021   ALKPHOS 52 09/03/2021   BILITOT 0.9 09/03/2021   Lab Results  Component Value Date   HGBA1C 5.7 09/03/2021   HGBA1C 5.7 03/05/2021   HGBA1C 5.6 09/01/2020   HGBA1C 5.4 03/03/2020   HGBA1C 5.6 10/22/2019   Lab Results  Component Value Date   INSULIN 9.0 10/22/2019   INSULIN 21.3 12/14/2017   Lab Results  Component Value Date   TSH 1.060 12/14/2017   Lab Results  Component Value Date   CHOL 121 09/03/2021   HDL 47.40 09/03/2021   LDLCALC 61 09/03/2021   TRIG 61.0 09/03/2021   CHOLHDL 3 09/03/2021   Lab Results  Component Value Date   VD25OH 66.83 09/03/2021   VD25OH 43.17 03/05/2021   VD25OH  44.50 09/01/2020   Lab Results  Component Value Date   WBC 5.6 09/03/2021   HGB 15.1 09/03/2021   HCT 45.9 09/03/2021   MCV 92.1 09/03/2021   PLT 157.0 09/03/2021   No results found for: "IRON", "TIBC", "FERRITIN"  Obesity Behavioral Intervention:   Approximately 15 minutes were spent on the discussion below.  ASK: We discussed the diagnosis of obesity with Eric [name] today and Eric [name] agreed to give [place] permission to discuss obesity behavioral modification therapy today.  ASSESS: [name] has  the diagnosis of obesity and his BMI today is 36.2. [name] is in the action stage of change.   ADVISE: Eric [name] was educated on the multiple health risks of obesity as well as the benefit of weight loss to improve his health. He was advised of the need for long term treatment and the importance of lifestyle modifications to improve his current health and to decrease his risk of future health problems.  AGREE: Multiple dietary modification options and treatment options were discussed and Eric [name] agreed to follow the recommendations documented in the above note.  ARRANGE: Eric [name] was educated on the importance of frequent visits to treat obesity as outlined per CMS and USPSTF guidelines and agreed to schedule his next follow up appointment today.  Attestation Statements:   Reviewed by clinician on day of visit: allergies, medications, problem list, medical history, surgical history, family history, social history, and previous encounter notes.   I, Trixie Dredge, am acting as transcriptionist for Eric Nip, MD.  I have reviewed the above documentation for accuracy and completeness, and I agree with the above. -  Eric Nip, MD

## 2021-10-09 ENCOUNTER — Other Ambulatory Visit (HOSPITAL_COMMUNITY): Payer: Self-pay

## 2021-10-15 ENCOUNTER — Other Ambulatory Visit (HOSPITAL_COMMUNITY): Payer: Self-pay

## 2021-10-15 ENCOUNTER — Encounter: Payer: Self-pay | Admitting: Family Medicine

## 2021-10-21 ENCOUNTER — Other Ambulatory Visit: Payer: Self-pay | Admitting: Family Medicine

## 2021-10-21 DIAGNOSIS — E785 Hyperlipidemia, unspecified: Secondary | ICD-10-CM

## 2021-10-21 DIAGNOSIS — K219 Gastro-esophageal reflux disease without esophagitis: Secondary | ICD-10-CM

## 2021-10-26 ENCOUNTER — Encounter (INDEPENDENT_AMBULATORY_CARE_PROVIDER_SITE_OTHER): Payer: Self-pay | Admitting: Family Medicine

## 2021-10-26 ENCOUNTER — Ambulatory Visit (INDEPENDENT_AMBULATORY_CARE_PROVIDER_SITE_OTHER): Payer: Medicare Other | Admitting: Family Medicine

## 2021-10-26 ENCOUNTER — Encounter: Payer: Self-pay | Admitting: Neurology

## 2021-10-26 VITALS — BP 110/59 | HR 56 | Temp 98.0°F | Ht 69.0 in | Wt 243.0 lb

## 2021-10-26 DIAGNOSIS — E1169 Type 2 diabetes mellitus with other specified complication: Secondary | ICD-10-CM | POA: Diagnosis not present

## 2021-10-26 DIAGNOSIS — E559 Vitamin D deficiency, unspecified: Secondary | ICD-10-CM

## 2021-10-26 DIAGNOSIS — Z7985 Long-term (current) use of injectable non-insulin antidiabetic drugs: Secondary | ICD-10-CM | POA: Diagnosis not present

## 2021-10-26 DIAGNOSIS — Z794 Long term (current) use of insulin: Secondary | ICD-10-CM | POA: Diagnosis not present

## 2021-10-26 DIAGNOSIS — Z6835 Body mass index (BMI) 35.0-35.9, adult: Secondary | ICD-10-CM | POA: Diagnosis not present

## 2021-10-26 DIAGNOSIS — E669 Obesity, unspecified: Secondary | ICD-10-CM

## 2021-10-26 MED ORDER — VITAMIN D (ERGOCALCIFEROL) 1.25 MG (50000 UNIT) PO CAPS: ORAL_CAPSULE | ORAL | 0 refills | Status: DC

## 2021-10-27 NOTE — Telephone Encounter (Signed)
Please advise 

## 2021-10-28 ENCOUNTER — Encounter (INDEPENDENT_AMBULATORY_CARE_PROVIDER_SITE_OTHER): Payer: Self-pay

## 2021-11-02 NOTE — Progress Notes (Unsigned)
Chief Complaint:   OBESITY Eric [name] is here to discuss his progress with his obesity treatment plan along with follow-up of his obesity related diagnoses. Eric [name] is on the Category 3 Plan and states he is following his eating plan approximately 50% of the time. Eric [name] states he is doing yard work for 60 minutes 3 times per week.  Today's visit was #: 71 Starting weight: 288 lbs Starting date: 12/14/2017 Today's weight: 243 lbs Today's date: 10/26/2021 Total lbs lost to date: 45 Total lbs lost since last in-office visit: 1  Interim History: Eric [name] continues to work on his diet, but he has a fair amount of social eating and eating out.  He is working on [employer] Financial [employer].  Subjective:   1. Type 2 diabetes mellitus with other specified complication, with long-term current use of insulin ([place]) Rayshaun is on Falun and he is working on his diet and exercise.  He notes some worsening heartburn even while on Protonix.  2. Vitamin D deficiency Eric [name] is stable on vitamin D, with no side effects noted.  Assessment/Plan:   1. Type 2 diabetes mellitus with other specified complication, with long-term current use of insulin (HCC) Eric [name] will continue metformin and Mounjaro, and we will continue to follow.  2. Vitamin D deficiency We will refill prescription Vitamin D 50,000 IU every week for 90 days. Eric [name] will follow-up for routine testing of Vitamin D, at least 2-3 times per year to avoid over-replacement.  - Vitamin D, Ergocalciferol, (DRISDOL) 1.25 MG (50000 UNIT) CAPS capsule; TAKE ONE CAPSULE BY MOUTH EVERY 7 DAYS  Dispense: 12 capsule; Refill: 0  3. Obesity, Current BMI 35.9 Eric [name] is currently in the action stage of change. As such, his goal is to continue with weight loss efforts. He has agreed to the Category 3 Plan.   Exercise goals: As is.   Behavioral modification strategies: increasing lean protein intake and increasing  vegetables.  Eric [name] has agreed to follow-up with our clinic in 4 weeks. He was informed of the importance of frequent follow-up visits to maximize his success with intensive lifestyle modifications for his multiple health conditions.   Objective:   Blood pressure (!) 110/59, pulse (!) 56, temperature 98 F (36.7 C), height '5\' 9"'$  (1.753 m), weight 243 lb (110.2 kg), SpO2 96 %. Body mass index is 35.88 kg/m.  General: Cooperative, alert, well developed, in no acute distress. HEENT: Conjunctivae and lids unremarkable. Cardiovascular: Regular rhythm.  Lungs: Normal work of breathing. Neurologic: No focal deficits.   Lab Results  Component Value Date   CREATININE 0.99 09/03/2021   BUN 18 09/03/2021   NA 141 09/03/2021   K 4.8 09/03/2021   CL 105 09/03/2021   CO2 29 09/03/2021   Lab Results  Component Value Date   ALT 14 09/03/2021   AST 13 09/03/2021   ALKPHOS 52 09/03/2021   BILITOT 0.9 09/03/2021   Lab Results  Component Value Date   HGBA1C 5.7 09/03/2021   HGBA1C 5.7 03/05/2021   HGBA1C 5.6 09/01/2020   HGBA1C 5.4 03/03/2020   HGBA1C 5.6 10/22/2019   Lab Results  Component Value Date   INSULIN 9.0 10/22/2019   INSULIN 21.3 12/14/2017   Lab Results  Component Value Date   TSH 1.060 12/14/2017   Lab Results  Component Value Date   CHOL 121 09/03/2021   HDL 47.40 09/03/2021   LDLCALC 61 09/03/2021   TRIG 61.0 09/03/2021   CHOLHDL 3 09/03/2021  Lab Results  Component Value Date   VD25OH 66.83 09/03/2021   VD25OH 43.17 03/05/2021   VD25OH 44.50 09/01/2020   Lab Results  Component Value Date   WBC 5.6 09/03/2021   HGB 15.1 09/03/2021   HCT 45.9 09/03/2021   MCV 92.1 09/03/2021   PLT 157.0 09/03/2021   No results found for: "IRON", "TIBC", "FERRITIN"  Attestation Statements:   Reviewed by clinician on day of visit: allergies, medications, problem list, medical history, surgical history, family history, social history, and previous encounter  notes.   I, Trixie Dredge, am acting as transcriptionist for [name], MD.  I have reviewed the above documentation for accuracy and completeness, and I agree with the above. -  [name], MD

## 2021-11-16 ENCOUNTER — Other Ambulatory Visit (HOSPITAL_COMMUNITY): Payer: Self-pay

## 2021-11-25 ENCOUNTER — Encounter (INDEPENDENT_AMBULATORY_CARE_PROVIDER_SITE_OTHER): Payer: Self-pay | Admitting: Family Medicine

## 2021-12-01 ENCOUNTER — Ambulatory Visit (INDEPENDENT_AMBULATORY_CARE_PROVIDER_SITE_OTHER): Payer: Medicare Other | Admitting: Family Medicine

## 2021-12-01 ENCOUNTER — Encounter: Payer: Self-pay | Admitting: Family Medicine

## 2021-12-01 ENCOUNTER — Encounter (INDEPENDENT_AMBULATORY_CARE_PROVIDER_SITE_OTHER): Payer: Self-pay | Admitting: Family Medicine

## 2021-12-01 VITALS — BP 101/66 | HR 55 | Temp 97.8°F | Ht 69.0 in | Wt 236.0 lb

## 2021-12-01 DIAGNOSIS — I10 Essential (primary) hypertension: Secondary | ICD-10-CM

## 2021-12-01 DIAGNOSIS — E669 Obesity, unspecified: Secondary | ICD-10-CM

## 2021-12-01 DIAGNOSIS — Z7984 Long term (current) use of oral hypoglycemic drugs: Secondary | ICD-10-CM

## 2021-12-01 DIAGNOSIS — Z7985 Long-term (current) use of injectable non-insulin antidiabetic drugs: Secondary | ICD-10-CM | POA: Diagnosis not present

## 2021-12-01 DIAGNOSIS — Z6834 Body mass index (BMI) 34.0-34.9, adult: Secondary | ICD-10-CM | POA: Diagnosis not present

## 2021-12-01 DIAGNOSIS — E1165 Type 2 diabetes mellitus with hyperglycemia: Secondary | ICD-10-CM | POA: Diagnosis not present

## 2021-12-01 MED ORDER — METFORMIN HCL 500 MG PO TABS: 500.0000 mg | ORAL_TABLET | Freq: Every day | ORAL | 0 refills | Status: DC

## 2021-12-01 MED ORDER — TIRZEPATIDE 5 MG/0.5ML ~~LOC~~ SOAJ: 5.0000 mg | SUBCUTANEOUS | 0 refills | Status: DC

## 2021-12-07 NOTE — Progress Notes (Unsigned)
Chief Complaint:   OBESITY Eric [name] is here to discuss his progress with his obesity treatment plan along with follow-up of his obesity related diagnoses. Eric [name] is on the Category 3 Plan and states he is following his eating plan approximately 65% of the time. Eric [name] states he is active while doing yard work 3 days per week.    Today's visit was #: 51 Starting weight: 288 lbs Starting date: 12/14/2017 Today's weight: 236 lbs Today's date: 12/01/2021 Total lbs lost to date: 52 Total lbs lost since last in-office visit: 7  Interim History: Eric [name] continues to do well with his diet and weight loss.  His hunger is controlled and he is doing well with decreasing snacking.  He has been more active recently.  Subjective:   1. Type 2 diabetes mellitus with hyperglycemia, without long-term current use of insulin (HCC) Eric [name] is on metformin and Mounjaro.  His recent A1c was at 5.7.  He denies nausea, vomiting, or hypoglycemia.  2. Essential hypertension Eric [name] is on valsartan, and his blood pressure is stable but on the low end of normal.  Assessment/Plan:   1. Type 2 diabetes mellitus with hyperglycemia, without long-term current use of insulin (HCC) Eric [name] will continue his medications, and we will refill metformin and Mounjaro for 1 month.  - metFORMIN (GLUCOPHAGE) 500 MG tablet; Take 1 tablet (500 mg total) by mouth daily with breakfast.  Dispense: 30 tablet; Refill: 0 - tirzepatide (MOUNJARO) 5 MG/0.5ML Pen; Inject 5 mg into the skin once a week.  Dispense: 6 mL; Refill: 0  2. Essential hypertension Eric [name] will continue with his diet and weight loss, and will watch for signs of hypotension.  3. Obesity, Current BMI 34.9 Eric [name] is currently in the action stage of change. As such, his goal is to continue with weight loss efforts. He has agreed to the Category 3 Plan.   Exercise goals: As is.   Behavioral modification strategies: increasing lean protein  intake.  Eric [name] has agreed to follow-up with our clinic in 4 weeks. He was informed of the importance of frequent follow-up visits to maximize his success with intensive lifestyle modifications for his multiple health conditions.   Objective:   Blood pressure 101/66, pulse (!) 55, temperature 97.8 F (36.6 C), height '5\' 9"'$  (1.753 m), weight 236 lb (107 kg), SpO2 97 %. Body mass index is 34.85 kg/m.  General: Cooperative, alert, well developed, in no acute distress. HEENT: Conjunctivae and lids unremarkable. Cardiovascular: Regular rhythm.  Lungs: Normal work of breathing. Neurologic: No focal deficits.   Lab Results  Component Value Date   CREATININE 0.99 09/03/2021   BUN 18 09/03/2021   NA 141 09/03/2021   K 4.8 09/03/2021   CL 105 09/03/2021   CO2 29 09/03/2021   Lab Results  Component Value Date   ALT 14 09/03/2021   AST 13 09/03/2021   ALKPHOS 52 09/03/2021   BILITOT 0.9 09/03/2021   Lab Results  Component Value Date   HGBA1C 5.7 09/03/2021   HGBA1C 5.7 03/05/2021   HGBA1C 5.6 09/01/2020   HGBA1C 5.4 03/03/2020   HGBA1C 5.6 10/22/2019   Lab Results  Component Value Date   INSULIN 9.0 10/22/2019   INSULIN 21.3 12/14/2017   Lab Results  Component Value Date   TSH 1.060 12/14/2017   Lab Results  Component Value Date   CHOL 121 09/03/2021   HDL 47.40 09/03/2021   LDLCALC 61 09/03/2021   TRIG 61.0 09/03/2021   CHOLHDL 3 09/03/2021  Lab Results  Component Value Date   VD25OH 66.83 09/03/2021   VD25OH 43.17 03/05/2021   VD25OH 44.50 09/01/2020   Lab Results  Component Value Date   WBC 5.6 09/03/2021   HGB 15.1 09/03/2021   HCT 45.9 09/03/2021   MCV 92.1 09/03/2021   PLT 157.0 09/03/2021   No results found for: "IRON", "TIBC", "FERRITIN"  Attestation Statements:   Reviewed by clinician on day of visit: allergies, medications, problem list, medical history, surgical history, family history, social history, and previous encounter notes.   I,  Trixie Dredge, am acting as transcriptionist for [name], MD.  I have reviewed the above documentation for accuracy and completeness, and I agree with the above. -  [name], MD

## 2021-12-16 ENCOUNTER — Encounter (INDEPENDENT_AMBULATORY_CARE_PROVIDER_SITE_OTHER): Payer: Self-pay | Admitting: Family Medicine

## 2021-12-18 DIAGNOSIS — M19012 Primary osteoarthritis, left shoulder: Secondary | ICD-10-CM | POA: Diagnosis not present

## 2021-12-18 DIAGNOSIS — M19011 Primary osteoarthritis, right shoulder: Secondary | ICD-10-CM | POA: Diagnosis not present

## 2021-12-21 ENCOUNTER — Ambulatory Visit (INDEPENDENT_AMBULATORY_CARE_PROVIDER_SITE_OTHER): Payer: Medicare Other | Admitting: Family Medicine

## 2021-12-21 VITALS — BP 116/69 | HR 51 | Temp 97.6°F | Ht 69.0 in | Wt 234.0 lb

## 2021-12-21 DIAGNOSIS — Z6834 Body mass index (BMI) 34.0-34.9, adult: Secondary | ICD-10-CM | POA: Diagnosis not present

## 2021-12-21 DIAGNOSIS — E669 Obesity, unspecified: Secondary | ICD-10-CM | POA: Diagnosis not present

## 2021-12-21 DIAGNOSIS — Z6841 Body Mass Index (BMI) 40.0 and over, adult: Secondary | ICD-10-CM

## 2021-12-21 DIAGNOSIS — E559 Vitamin D deficiency, unspecified: Secondary | ICD-10-CM | POA: Diagnosis not present

## 2021-12-21 DIAGNOSIS — Z794 Long term (current) use of insulin: Secondary | ICD-10-CM | POA: Diagnosis not present

## 2021-12-21 DIAGNOSIS — E1169 Type 2 diabetes mellitus with other specified complication: Secondary | ICD-10-CM

## 2021-12-21 DIAGNOSIS — Z7985 Long-term (current) use of injectable non-insulin antidiabetic drugs: Secondary | ICD-10-CM | POA: Diagnosis not present

## 2021-12-21 DIAGNOSIS — K5909 Other constipation: Secondary | ICD-10-CM | POA: Diagnosis not present

## 2021-12-21 MED ORDER — VITAMIN D (ERGOCALCIFEROL) 1.25 MG (50000 UNIT) PO CAPS: ORAL_CAPSULE | ORAL | 0 refills | Status: DC

## 2021-12-22 DIAGNOSIS — Z96641 Presence of right artificial hip joint: Secondary | ICD-10-CM | POA: Diagnosis not present

## 2021-12-22 DIAGNOSIS — Z96642 Presence of left artificial hip joint: Secondary | ICD-10-CM | POA: Diagnosis not present

## 2021-12-22 DIAGNOSIS — Z09 Encounter for follow-up examination after completed treatment for conditions other than malignant neoplasm: Secondary | ICD-10-CM | POA: Diagnosis not present

## 2021-12-22 NOTE — Progress Notes (Signed)
Chief Complaint:   OBESITY Eric [name] is here to discuss his progress with his obesity treatment plan along with follow-up of his obesity related diagnoses. Eric [name] is on the Category 3 Plan and states he is following his eating plan approximately 50% of the time. Eric [name] states he is doing yard work for 90 minutes 3 times per week.  Today's visit was #: 28 Starting weight: 288 lbs Starting date: 12/14/2017 Today's weight: 234 lbs Today's date: 12/21/2021 Total lbs lost to date: 54 Total lbs lost since last in-office visit: 2  Interim History: Eric [name] continues to do well with weight loss.  His hunger is controlled.  He is working on meeting his protein goals.  Subjective:   1. Type 2 diabetes mellitus with other specified complication, with long-term current use of insulin (HCC) Eric [name] is doing well on Mounjaro, and he notes decreased polyphagia.  2. Other constipation Eric [name] is on MiraLAX, and he finds it helpful.  3. Vitamin D deficiency Eric [name] is on vitamin D prescription, with no side effects noted.  Assessment/Plan:   1. Type 2 diabetes mellitus with other specified complication, with long-term current use of insulin (HCC) Eric [name] will continue Eric [name] and his diet, and we will continue to follow.  2. Other constipation Eric [name] will continue MiraLAX OTC, and we will follow-up at his next visit.  3. Vitamin D deficiency We will refill prescription Vitamin D for 90 days. Eric [name] will follow-up for routine testing of Vitamin D, at least 2-3 times per year to avoid over-replacement.  - Vitamin D, Ergocalciferol, (DRISDOL) 1.25 MG (50000 UNIT) CAPS capsule; TAKE ONE CAPSULE BY MOUTH EVERY 7 DAYS  Dispense: 12 capsule; Refill: 0  4. Obesity, Current BMI 34.6 Eric [name] is currently in the action stage of change. As such, his goal is to continue with weight loss efforts. He has agreed to the Category 3 Plan.   Exercise goals: As is.   Behavioral modification strategies:  increasing lean protein intake.  Eric [name] has agreed to follow-up with our clinic in 4 weeks. He was informed of the importance of frequent follow-up visits to maximize his success with intensive lifestyle modifications for his multiple health conditions.   Objective:   Blood pressure 116/69, pulse (!) 51, temperature 97.6 F (36.4 C), height '5\' 9"'$  (1.753 m), weight 234 lb (106.1 kg), SpO2 97 %. Body mass index is 34.56 kg/m.  General: Cooperative, alert, well developed, in no acute distress. HEENT: Conjunctivae and lids unremarkable. Cardiovascular: Regular rhythm.  Lungs: Normal work of breathing. Neurologic: No focal deficits.   Lab Results  Component Value Date   CREATININE 0.99 09/03/2021   BUN 18 09/03/2021   NA 141 09/03/2021   K 4.8 09/03/2021   CL 105 09/03/2021   CO2 29 09/03/2021   Lab Results  Component Value Date   ALT 14 09/03/2021   AST 13 09/03/2021   ALKPHOS 52 09/03/2021   BILITOT 0.9 09/03/2021   Lab Results  Component Value Date   HGBA1C 5.7 09/03/2021   HGBA1C 5.7 03/05/2021   HGBA1C 5.6 09/01/2020   HGBA1C 5.4 03/03/2020   HGBA1C 5.6 10/22/2019   Lab Results  Component Value Date   INSULIN 9.0 10/22/2019   INSULIN 21.3 12/14/2017   Lab Results  Component Value Date   TSH 1.060 12/14/2017   Lab Results  Component Value Date   CHOL 121 09/03/2021   HDL 47.40 09/03/2021   LDLCALC 61 09/03/2021   TRIG 61.0 09/03/2021   CHOLHDL 3 09/03/2021  Lab Results  Component Value Date   VD25OH 66.83 09/03/2021   VD25OH 43.17 03/05/2021   VD25OH 44.50 09/01/2020   Lab Results  Component Value Date   WBC 5.6 09/03/2021   HGB 15.1 09/03/2021   HCT 45.9 09/03/2021   MCV 92.1 09/03/2021   PLT 157.0 09/03/2021   No results found for: "IRON", "TIBC", "FERRITIN"  Attestation Statements:   Reviewed by clinician on day of visit: allergies, medications, problem list, medical history, surgical history, family history, social history, and previous  encounter notes.   I, Trixie Dredge, am acting as transcriptionist for [name], MD.  I have reviewed the above documentation for accuracy and completeness, and I agree with the above. -  [name], MD

## 2022-01-04 DIAGNOSIS — Z23 Encounter for immunization: Secondary | ICD-10-CM | POA: Diagnosis not present

## 2022-01-05 ENCOUNTER — Encounter: Payer: Self-pay | Admitting: Family Medicine

## 2022-01-11 ENCOUNTER — Other Ambulatory Visit (INDEPENDENT_AMBULATORY_CARE_PROVIDER_SITE_OTHER): Payer: Self-pay | Admitting: Family Medicine

## 2022-01-11 DIAGNOSIS — E1165 Type 2 diabetes mellitus with hyperglycemia: Secondary | ICD-10-CM

## 2022-01-13 ENCOUNTER — Encounter (INDEPENDENT_AMBULATORY_CARE_PROVIDER_SITE_OTHER): Payer: Self-pay | Admitting: Family Medicine

## 2022-01-13 ENCOUNTER — Other Ambulatory Visit (INDEPENDENT_AMBULATORY_CARE_PROVIDER_SITE_OTHER): Payer: Self-pay

## 2022-01-13 ENCOUNTER — Encounter: Payer: Self-pay | Admitting: Family Medicine

## 2022-01-13 DIAGNOSIS — E1165 Type 2 diabetes mellitus with hyperglycemia: Secondary | ICD-10-CM

## 2022-01-13 MED ORDER — TIRZEPATIDE 5 MG/0.5ML ~~LOC~~ SOAJ: 5.0000 mg | SUBCUTANEOUS | 0 refills | Status: DC

## 2022-01-13 NOTE — Telephone Encounter (Signed)
Last OV 12/21/21 Dr Leafy Ro Last Sent 12/01/21 88m-Not picked up Next OV 01/19/22

## 2022-01-19 ENCOUNTER — Ambulatory Visit (INDEPENDENT_AMBULATORY_CARE_PROVIDER_SITE_OTHER): Payer: Medicare Other | Admitting: Family Medicine

## 2022-01-19 ENCOUNTER — Encounter (INDEPENDENT_AMBULATORY_CARE_PROVIDER_SITE_OTHER): Payer: Self-pay | Admitting: Family Medicine

## 2022-01-19 VITALS — BP 102/69 | HR 54 | Temp 98.1°F | Ht 69.0 in | Wt 233.6 lb

## 2022-01-19 DIAGNOSIS — Z6834 Body mass index (BMI) 34.0-34.9, adult: Secondary | ICD-10-CM

## 2022-01-19 DIAGNOSIS — K5909 Other constipation: Secondary | ICD-10-CM

## 2022-01-19 DIAGNOSIS — Z7985 Long-term (current) use of injectable non-insulin antidiabetic drugs: Secondary | ICD-10-CM | POA: Diagnosis not present

## 2022-01-19 DIAGNOSIS — E66813 Obesity, class 3: Secondary | ICD-10-CM

## 2022-01-19 DIAGNOSIS — E669 Obesity, unspecified: Secondary | ICD-10-CM | POA: Diagnosis not present

## 2022-01-19 DIAGNOSIS — Z7984 Long term (current) use of oral hypoglycemic drugs: Secondary | ICD-10-CM | POA: Diagnosis not present

## 2022-01-19 DIAGNOSIS — E1165 Type 2 diabetes mellitus with hyperglycemia: Secondary | ICD-10-CM | POA: Diagnosis not present

## 2022-01-26 NOTE — Progress Notes (Unsigned)
Chief Complaint:   OBESITY Eric [name] is here to discuss his progress with his obesity treatment plan along with follow-up of his obesity related diagnoses. Eric [name] is on {MWMwtlossportion/plan2:23431} and states he is following his eating plan approximately ***% of the time. Eric [name] states he is *** *** minutes *** times per week.  Today's visit was #: *** Starting weight: *** Starting date: *** Today's weight: *** Today's date: 01/19/2022 Total lbs lost to date: *** Total lbs lost since last in-office visit: ***  Interim History: ***  Subjective:   1. Type 2 diabetes mellitus with hyperglycemia, without long-term current use of insulin (HCC) ***  2. Other constipation ***  Assessment/Plan:   1. Type 2 diabetes mellitus with hyperglycemia, without long-term current use of insulin (HCC) ***  2. Other constipation ***  3. Obesity, Current BMI 34.5 Eric [name] is currently in the action stage of change. As such, his goal is to continue with weight loss efforts. He has agreed to the Category 3 Plan.   Exercise goals: As is.   Behavioral modification strategies: increasing lean protein intake, decreasing simple carbohydrates, meal planning and cooking strategies, better snacking choices, and holiday eating strategies .  Eric [name] has agreed to follow-up with our clinic in 4 weeks. He was informed of the importance of frequent follow-up visits to maximize his success with intensive lifestyle modifications for his multiple health conditions.   Objective:   Blood pressure 102/69, pulse (!) 54, temperature 98.1 F (36.7 C), height '5\' 9"'$  (1.753 m), weight 233 lb 9.6 oz (106 kg), SpO2 97 %. Body mass index is 34.5 kg/m.  General: Cooperative, alert, well developed, in no acute distress. HEENT: Conjunctivae and lids unremarkable. Cardiovascular: Regular rhythm.  Lungs: Normal work of breathing. Neurologic: No focal deficits.   Lab Results  Component Value Date   CREATININE  0.99 09/03/2021   BUN 18 09/03/2021   NA 141 09/03/2021   K 4.8 09/03/2021   CL 105 09/03/2021   CO2 29 09/03/2021   Lab Results  Component Value Date   ALT 14 09/03/2021   AST 13 09/03/2021   ALKPHOS 52 09/03/2021   BILITOT 0.9 09/03/2021   Lab Results  Component Value Date   HGBA1C 5.7 09/03/2021   HGBA1C 5.7 03/05/2021   HGBA1C 5.6 09/01/2020   HGBA1C 5.4 03/03/2020   HGBA1C 5.6 10/22/2019   Lab Results  Component Value Date   INSULIN 9.0 10/22/2019   INSULIN 21.3 12/14/2017   Lab Results  Component Value Date   TSH 1.060 12/14/2017   Lab Results  Component Value Date   CHOL 121 09/03/2021   HDL 47.40 09/03/2021   LDLCALC 61 09/03/2021   TRIG 61.0 09/03/2021   CHOLHDL 3 09/03/2021   Lab Results  Component Value Date   VD25OH 66.83 09/03/2021   VD25OH 43.17 03/05/2021   VD25OH 44.50 09/01/2020   Lab Results  Component Value Date   WBC 5.6 09/03/2021   HGB 15.1 09/03/2021   HCT 45.9 09/03/2021   MCV 92.1 09/03/2021   PLT 157.0 09/03/2021   No results found for: "IRON", "TIBC", "FERRITIN"  Attestation Statements:   Reviewed by clinician on day of visit: allergies, medications, problem list, medical history, surgical history, family history, social history, and previous encounter notes.  Time spent on visit including pre-visit chart review and post-visit care and charting was 20 minutes.   I, Trixie Dredge, am acting as transcriptionist for [name], MD.  I have reviewed the above documentation for  accuracy and completeness, and I agree with the above. -  ***

## 2022-02-09 ENCOUNTER — Encounter: Payer: Self-pay | Admitting: Family Medicine

## 2022-02-09 DIAGNOSIS — N400 Enlarged prostate without lower urinary tract symptoms: Secondary | ICD-10-CM

## 2022-02-09 DIAGNOSIS — M1712 Unilateral primary osteoarthritis, left knee: Secondary | ICD-10-CM | POA: Diagnosis not present

## 2022-02-09 DIAGNOSIS — M1711 Unilateral primary osteoarthritis, right knee: Secondary | ICD-10-CM | POA: Diagnosis not present

## 2022-02-09 DIAGNOSIS — M170 Bilateral primary osteoarthritis of knee: Secondary | ICD-10-CM | POA: Diagnosis not present

## 2022-02-09 MED ORDER — TAMSULOSIN HCL 0.4 MG PO CAPS: 0.4000 mg | ORAL_CAPSULE | Freq: Every day | ORAL | 1 refills | Status: DC

## 2022-02-16 ENCOUNTER — Ambulatory Visit (INDEPENDENT_AMBULATORY_CARE_PROVIDER_SITE_OTHER): Payer: Medicare Other | Admitting: Family Medicine

## 2022-02-16 DIAGNOSIS — D485 Neoplasm of uncertain behavior of skin: Secondary | ICD-10-CM | POA: Diagnosis not present

## 2022-02-16 DIAGNOSIS — C44319 Basal cell carcinoma of skin of other parts of face: Secondary | ICD-10-CM | POA: Diagnosis not present

## 2022-02-16 DIAGNOSIS — L814 Other melanin hyperpigmentation: Secondary | ICD-10-CM | POA: Diagnosis not present

## 2022-02-16 DIAGNOSIS — X32XXXS Exposure to sunlight, sequela: Secondary | ICD-10-CM | POA: Diagnosis not present

## 2022-02-16 DIAGNOSIS — L570 Actinic keratosis: Secondary | ICD-10-CM | POA: Diagnosis not present

## 2022-02-16 DIAGNOSIS — E669 Obesity, unspecified: Secondary | ICD-10-CM | POA: Diagnosis not present

## 2022-02-16 DIAGNOSIS — D1801 Hemangioma of skin and subcutaneous tissue: Secondary | ICD-10-CM | POA: Diagnosis not present

## 2022-02-18 ENCOUNTER — Ambulatory Visit (INDEPENDENT_AMBULATORY_CARE_PROVIDER_SITE_OTHER): Payer: Medicare Other | Admitting: Family Medicine

## 2022-02-18 ENCOUNTER — Encounter (INDEPENDENT_AMBULATORY_CARE_PROVIDER_SITE_OTHER): Payer: Self-pay | Admitting: Family Medicine

## 2022-02-18 VITALS — BP 116/66 | HR 62 | Temp 98.0°F | Ht 69.0 in | Wt 234.0 lb

## 2022-02-18 DIAGNOSIS — Z7984 Long term (current) use of oral hypoglycemic drugs: Secondary | ICD-10-CM | POA: Diagnosis not present

## 2022-02-18 DIAGNOSIS — Z7985 Long-term (current) use of injectable non-insulin antidiabetic drugs: Secondary | ICD-10-CM | POA: Diagnosis not present

## 2022-02-18 DIAGNOSIS — E1165 Type 2 diabetes mellitus with hyperglycemia: Secondary | ICD-10-CM

## 2022-02-18 DIAGNOSIS — E669 Obesity, unspecified: Secondary | ICD-10-CM | POA: Diagnosis not present

## 2022-02-18 DIAGNOSIS — E559 Vitamin D deficiency, unspecified: Secondary | ICD-10-CM | POA: Diagnosis not present

## 2022-02-18 DIAGNOSIS — Z6834 Body mass index (BMI) 34.0-34.9, adult: Secondary | ICD-10-CM | POA: Diagnosis not present

## 2022-02-18 MED ORDER — METFORMIN HCL 500 MG PO TABS: 500.0000 mg | ORAL_TABLET | Freq: Every day | ORAL | 0 refills | Status: DC

## 2022-02-18 MED ORDER — VITAMIN D (ERGOCALCIFEROL) 1.25 MG (50000 UNIT) PO CAPS: ORAL_CAPSULE | ORAL | 0 refills | Status: DC

## 2022-03-01 NOTE — Progress Notes (Unsigned)
Chief Complaint:   OBESITY Eric [name] is here to discuss his progress with his obesity treatment plan along with follow-up of his obesity related diagnoses. Eric [name] is on the Category 3 Plan and states he is following his eating plan approximately 10% of the time. Eric [name] states he is active while doing yard work.  Today's visit was #: 62 Starting weight: 288 lbs Starting date: 12/14/2017 Today's weight: 234 lbs Today's date: 02/18/2022 Total lbs lost to date: 54 Total lbs lost since last in-office visit: 0  Interim History: Eric [name] has done very well with minimizing holiday weight gain over Thanksgiving. He notes some increased carbohydrate cravings recently, but he is working to get back on track.   Subjective:   1. Vitamin D deficiency Eric [name] is on Vitamin D, and his last level was at goal. He denies nausea, vomiting, or muscle weakness.   2. Type 2 diabetes mellitus with hyperglycemia, without long-term current use of insulin (HCC) Eric [name] is doing well on metformin and Mounjaro. He notes decreased polyphagia. He denies nausea, vomiting, or hypoglycemia.   Assessment/Plan:   1. Vitamin D deficiency We will refill prescription Vitamin D for 90 days. Eric [name] will follow-up for routine testing of Vitamin D, at least 2-3 times per year to avoid over-replacement.  - Vitamin D, Ergocalciferol, (DRISDOL) 1.25 MG (50000 UNIT) CAPS capsule; TAKE ONE CAPSULE BY MOUTH EVERY 7 DAYS  Dispense: 12 capsule; Refill: 0  2. Type 2 diabetes mellitus with hyperglycemia, without long-term current use of insulin (HCC) We will refill metformin for 1 month, and Eric [name] will continue [facility] as is.   - metFORMIN (GLUCOPHAGE) 500 MG tablet; Take 1 tablet (500 mg total) by mouth daily with breakfast.  Dispense: 30 tablet; Refill: 0  3. Obesity, Current BMI 34.6 Eric [name] is currently in the action stage of change. As such, his goal is to continue with weight loss efforts. He has agreed to the  Category 3 Plan.   Exercise goals: As is.   Behavioral modification strategies: increasing lean protein intake and holiday eating strategies .  Eric [name] has agreed to follow-up with our clinic in 4 weeks. He was informed of the importance of frequent follow-up visits to maximize his success with intensive lifestyle modifications for his multiple health conditions.   Objective:   Blood pressure 116/66, pulse 62, temperature 98 F (36.7 C), height '5\' 9"'$  (1.753 m), weight 234 lb (106.1 kg), SpO2 99 %. Body mass index is 34.56 kg/m.  General: Cooperative, alert, well developed, in no acute distress. HEENT: Conjunctivae and lids unremarkable. Cardiovascular: Regular rhythm.  Lungs: Normal work of breathing. Neurologic: No focal deficits.   Lab Results  Component Value Date   CREATININE 0.99 09/03/2021   BUN 18 09/03/2021   NA 141 09/03/2021   K 4.8 09/03/2021   CL 105 09/03/2021   CO2 29 09/03/2021   Lab Results  Component Value Date   ALT 14 09/03/2021   AST 13 09/03/2021   ALKPHOS 52 09/03/2021   BILITOT 0.9 09/03/2021   Lab Results  Component Value Date   HGBA1C 5.7 09/03/2021   HGBA1C 5.7 03/05/2021   HGBA1C 5.6 09/01/2020   HGBA1C 5.4 03/03/2020   HGBA1C 5.6 10/22/2019   Lab Results  Component Value Date   INSULIN 9.0 10/22/2019   INSULIN 21.3 12/14/2017   Lab Results  Component Value Date   TSH 1.060 12/14/2017   Lab Results  Component Value Date   CHOL 121 09/03/2021   HDL 47.40 09/03/2021  LDLCALC 61 09/03/2021   TRIG 61.0 09/03/2021   CHOLHDL 3 09/03/2021   Lab Results  Component Value Date   VD25OH 66.83 09/03/2021   VD25OH 43.17 03/05/2021   VD25OH 44.50 09/01/2020   Lab Results  Component Value Date   WBC 5.6 09/03/2021   HGB 15.1 09/03/2021   HCT 45.9 09/03/2021   MCV 92.1 09/03/2021   PLT 157.0 09/03/2021   No results found for: "IRON", "TIBC", "FERRITIN"  Attestation Statements:   Reviewed by clinician on day of visit:  allergies, medications, problem list, medical history, surgical history, family history, social history, and previous encounter notes.  Time spent on visit including pre-visit chart review and post-visit care and charting was 40 minutes.   I, Trixie Dredge, am acting as transcriptionist for [name], MD.  I have reviewed the above documentation for accuracy and completeness, and I agree with the above. -  [name], MD

## 2022-03-05 ENCOUNTER — Ambulatory Visit (INDEPENDENT_AMBULATORY_CARE_PROVIDER_SITE_OTHER): Payer: Medicare Other | Admitting: Family Medicine

## 2022-03-05 ENCOUNTER — Encounter: Payer: Self-pay | Admitting: Family Medicine

## 2022-03-05 VITALS — BP 124/70 | HR 54 | Temp 98.1°F | Resp 18 | Ht 69.0 in | Wt 241.0 lb

## 2022-03-05 DIAGNOSIS — R350 Frequency of micturition: Secondary | ICD-10-CM

## 2022-03-05 DIAGNOSIS — E785 Hyperlipidemia, unspecified: Secondary | ICD-10-CM | POA: Diagnosis not present

## 2022-03-05 DIAGNOSIS — G4733 Obstructive sleep apnea (adult) (pediatric): Secondary | ICD-10-CM

## 2022-03-05 DIAGNOSIS — R001 Bradycardia, unspecified: Secondary | ICD-10-CM

## 2022-03-05 DIAGNOSIS — E1169 Type 2 diabetes mellitus with other specified complication: Secondary | ICD-10-CM

## 2022-03-05 DIAGNOSIS — E1165 Type 2 diabetes mellitus with hyperglycemia: Secondary | ICD-10-CM

## 2022-03-05 DIAGNOSIS — E042 Nontoxic multinodular goiter: Secondary | ICD-10-CM | POA: Diagnosis not present

## 2022-03-05 DIAGNOSIS — I10 Essential (primary) hypertension: Secondary | ICD-10-CM

## 2022-03-05 DIAGNOSIS — E559 Vitamin D deficiency, unspecified: Secondary | ICD-10-CM

## 2022-03-05 DIAGNOSIS — Z6841 Body Mass Index (BMI) 40.0 and over, adult: Secondary | ICD-10-CM | POA: Diagnosis not present

## 2022-03-05 DIAGNOSIS — Z794 Long term (current) use of insulin: Secondary | ICD-10-CM | POA: Diagnosis not present

## 2022-03-05 LAB — COMPREHENSIVE METABOLIC PANEL
ALT: 18 U/L (ref 0–53)
AST: 12 U/L (ref 0–37)
Albumin: 4.2 g/dL (ref 3.5–5.2)
Alkaline Phosphatase: 55 U/L (ref 39–117)
BUN: 20 mg/dL (ref 6–23)
CO2: 30 mEq/L (ref 19–32)
Calcium: 9.3 mg/dL (ref 8.4–10.5)
Chloride: 103 mEq/L (ref 96–112)
Creatinine, Ser: 0.96 mg/dL (ref 0.40–1.50)
GFR: 77.13 mL/min (ref >60.00)
Glucose, Bld: 81 mg/dL (ref 70–99)
Potassium: 4.2 mEq/L (ref 3.5–5.1)
Sodium: 141 mEq/L (ref 135–145)
Total Bilirubin: 0.7 mg/dL (ref 0.2–1.2)
Total Protein: 6.3 g/dL (ref 6.0–8.3)

## 2022-03-05 LAB — CBC WITH DIFFERENTIAL/PLATELET
Basophils Absolute: 0 10*3/uL (ref 0.0–0.1)
Basophils Relative: 0.3 % (ref 0.0–3.0)
Eosinophils Absolute: 0.1 10*3/uL (ref 0.0–0.7)
Eosinophils Relative: 1 % (ref 0.0–5.0)
HCT: 46 % (ref 39.0–52.0)
Hemoglobin: 15.6 g/dL (ref 13.0–17.0)
Lymphocytes Relative: 25.7 % (ref 12.0–46.0)
Lymphs Abs: 1.4 10*3/uL (ref 0.7–4.0)
MCHC: 34 g/dL (ref 30.0–36.0)
MCV: 91.7 fl (ref 78.0–100.0)
Monocytes Absolute: 0.5 10*3/uL (ref 0.1–1.0)
Monocytes Relative: 9.3 % (ref 3.0–12.0)
Neutro Abs: 3.6 10*3/uL (ref 1.4–7.7)
Neutrophils Relative %: 63.7 % (ref 43.0–77.0)
Platelets: 186 10*3/uL (ref 150.0–400.0)
RBC: 5.01 Mil/uL (ref 4.22–5.81)
RDW: 14.1 % (ref 11.5–15.5)
WBC: 5.6 10*3/uL (ref 4.0–10.5)

## 2022-03-05 LAB — LIPID PANEL
Cholesterol: 127 mg/dL (ref 0–200)
HDL: 52.1 mg/dL (ref >39.00)
LDL Cholesterol: 63 mg/dL (ref 0–99)
NonHDL: 75.31
Total CHOL/HDL Ratio: 2
Triglycerides: 64 mg/dL (ref 0.0–149.0)
VLDL: 12.8 mg/dL (ref 0.0–40.0)

## 2022-03-05 LAB — PSA: PSA: 1.31 ng/mL (ref 0.10–4.00)

## 2022-03-05 LAB — TSH: TSH: 0.58 u[IU]/mL (ref 0.35–5.50)

## 2022-03-05 LAB — HEMOGLOBIN A1C: Hgb A1c MFr Bld: 5.3 % (ref 4.6–6.5)

## 2022-03-05 LAB — VITAMIN D 25 HYDROXY (VIT D DEFICIENCY, FRACTURES): VITD: 70.37 ng/mL (ref 30.00–100.00)

## 2022-03-05 NOTE — Assessment & Plan Note (Signed)
Con't diet and exercise

## 2022-03-05 NOTE — Patient Instructions (Signed)

## 2022-03-05 NOTE — Assessment & Plan Note (Signed)
Tolerating statin, encouraged heart healthy diet, avoid trans fats, minimize simple carbs and saturated fats. Increase exercise as tolerated 

## 2022-03-05 NOTE — Assessment & Plan Note (Signed)
Well controlled, no changes to meds. Encouraged heart healthy diet such as the DASH diet and exercise as tolerated.  °

## 2022-03-05 NOTE — Progress Notes (Signed)
Established Patient Office Visit  Subjective   Patient ID: Eric [name], male    DOB: [birth year]  Age: 76 y.o. MRN: [id number]  Chief Complaint  Patient presents with   Hypertension   Hyperlipidemia   Follow-up    HPI Pt here for f/u bp and cholesterol   no complaints  Patient Active Problem List   Diagnosis Date Noted   Other constipation 12/21/2021   Diabetes mellitus ([place]) 09/28/2021   Bronchitis 02/03/2021   Depression 04/02/2020   Viral upper respiratory tract infection 47/11/6281   Folliculitis 66/29/4765   Preventative health care 03/02/2019   Morbid obesity ([place]) 03/02/2019   Hypotension 10/11/2018   Status post total hip replacement, right 09/25/2018   Osteoarthritis of right hip 09/21/2018   Prediabetes 08/29/2018   Vitamin D deficiency 08/29/2018   Primary osteoarthritis of right hip 08/29/2018   Thyroid nodule 05/12/2017   Primary osteoarthritis of left hip 03/28/2017   Status post total replacement of left hip 03/28/2017   Osteoarthritis of left hip 03/25/2017   Essential hypertension 11/18/2016   Hyperlipidemia LDL goal <100 11/18/2016   Need for prophylactic vaccination and inoculation against influenza 11/18/2016   Primary localized osteoarthrosis of right shoulder 05/25/2016   History of total replacement of right shoulder joint 05/25/2016   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   S/P shoulder replacement 10/14/2015   Low back pain 09/03/2015   Left foot pain 04/04/2014   Left shoulder pain 02/20/2014   Palpitations 12/25/2012   Left hip pain 12/25/2012   Class 3 severe obesity with serious comorbidity and body mass index (BMI) of 40.0 to 44.9 in adult [facility]) 12/25/2012   Acute bronchitis 12/25/2012   Urgency of urination 12/24/2011   Urinary frequency 12/24/2011   Pre-op evaluation 10/08/2010   Hyperlipidemia 10/08/2010   DECREASED HEARING 09/04/2009   INTERNAL HEMORRHOIDS WITHOUT MENTION COMP 09/04/2009   ALLERGIC  RHINITIS, SEASONAL 09/04/2009   GERD 09/04/2009   DIVERTICULOSIS, COLON 09/04/2009   OSTEOARTHRITIS 09/04/2009   Sleep apnea 09/04/2009   Past Medical History:  Diagnosis Date   Alcohol abuse    Allergy    Back pain    Cataract    forming   Colon polyps    Diverticulosis of colon    DJD (degenerative joint disease)    Dysrhythmia    Palpations from caffiene- reports he was having PACs due to slim fast wth caffeine - had full cardiac w/u and was told to cut back on caffeine;  now drink decaf bevrerages and no rpeort of recurrence    Enlarged prostate    GERD (gastroesophageal reflux disease)    Hearing loss    Hip osteoarthritis    Left   History of hay fever    Hx of cardiovascular stress test    Lexiscan Myoview (04/2013):  No ischemia, EF 61%; low risk   Hyperlipemia    Hypertension    Insulin resistance    om metfromin    Internal hemorrhoids without mention of complication    Leg edema    Neuroma of foot    left   Obesity    Osteoarthrosis, unspecified whether generalized or localized, unspecified site    Palpitations    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   Primary localized osteoarthrosis of right shoulder 05/25/2016   Recent weight loss    75lbs   Seasonal allergies    Sleep apnea    CPAP   Thyroid nodule    calcified thyroid nodules-  bx negative    Tremor of both hands    Past Surgical History:  Procedure Laterality Date   COLONOSCOPY     POLYPECTOMY     SHOULDER SURGERY  2009   right   TONSILLECTOMY     TOTAL HIP ARTHROPLASTY Left 03/28/2017   Procedure: TOTAL HIP ARTHROPLASTY ANTERIOR APPROACH;  Surgeon: Frederik Pear, MD;  Location: [place];  Service: Orthopedics;  Laterality: Left;   TOTAL HIP ARTHROPLASTY Right 09/25/2018   Procedure: Right Anterior Hip Arthroplasty;  Surgeon: Frederik Pear, MD;  Location: [facility];  Service: Orthopedics;  Laterality: Right;   TOTAL SHOULDER ARTHROPLASTY Left 10/14/2015   Procedure: LEFT TOTAL SHOULDER  ARTHROPLASTY;  Surgeon: Marchia Bond, MD;  Location: [place];  Service: Orthopedics;  Laterality: Left;   TOTAL SHOULDER ARTHROPLASTY Right 05/25/2016   Procedure: TOTAL SHOULDER ARTHROPLASTY;  Surgeon: Marchia Bond, MD;  Location: [place];  Service: Orthopedics;  Laterality: Right;   Social History   Tobacco Use   Smoking status: Never   Smokeless tobacco: Never  Vaping Use   Vaping Use: Never used  Substance Use Topics   Alcohol use: Not Currently    Alcohol/week: 1.0 standard drink of alcohol    Types: 1 Glasses of wine per week    Comment: rare----12 a year   Drug use: No   Social History   Socioeconomic History   Marital status: Married    Spouse name: Brock Larmon    Number of children: 3   Years of education: Not on file   Highest education level: Not on file  Occupational History   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    Social Determinants of Health   Financial Resource Strain: Low Risk  (08/05/2021)   Overall Financial Resource Strain (CARDIA)    Difficulty of Paying Living Expenses: Not hard at all  Food Insecurity: No Food Insecurity (08/05/2021)   Hunger Vital Sign    Worried About Running Out of Food in the Last Year: Never true    Ran Out of Food in the Last Year: Never true  Transportation Needs: No Transportation Needs (08/05/2021)   PRAPARE - Hydrologist (Medical): No    Lack of Transportation (Non-Medical): No  Physical Activity: Sufficiently Active (08/05/2021)    Exercise Vital Sign    Days of Exercise per Week: 7 days    Minutes of Exercise per Session: 60 min  Stress: No Stress Concern Present (08/05/2021)   [place]    Feeling of Stress : Not at all  Social Connections: Moderately Integrated (08/05/2021)   Social Connection and Isolation Panel [NHANES]    Frequency of Communication with Friends and Family: Three times a week    Frequency of Social Gatherings with Friends and Family: Twice a week    Attends Religious Services: More than 4 times per year    Active Member of  Clubs or Organizations: No    Attends Archivist Meetings: Never    Marital Status: Married  Human resources officer Violence: Not At Risk (08/05/2021)   Humiliation, Afraid, Rape, and Kick questionnaire    Fear of Current or Ex-Partner: No    Emotionally Abused: No    Physically Abused: No    Sexually Abused: No   Family Status  Relation Name Status   Mother  Deceased   Father  Deceased at age 53       heart   Sister  Alive   Brother  Alive   PGF  (Not Specified)   Other  (Not Specified)   Neg Hx  (Not Specified)   Family History  Problem Relation Age of Onset   Arthritis Mother    Cancer Mother 19       breast   Hyperlipidemia Mother    Colon polyps Mother 27   Hypertension Mother    Heart disease Mother    Depression Mother    Anxiety disorder Mother    Obesity Mother    Breast cancer Mother    Heart disease Father        CAD--passed away 06-09-2011 age 86   Hyperlipidemia Father    Hypertension Father    Obesity Father    Diabetes Father    Depression Father    Sleep apnea Father    Cancer Sister 36       breast   Breast cancer Sister    Sleep apnea Sister    Cancer Brother 35       prostate   Prostate cancer Brother    Tremor Paternal Grandfather    Breast cancer Other    Prostate cancer Other    Colon cancer Neg Hx    Esophageal cancer Neg Hx    Rectal cancer Neg Hx     Stomach cancer Neg Hx    Allergies  Allergen Reactions   Lisinopril Cough      Review of Systems  Constitutional:  Negative for chills, fever and malaise/fatigue.  HENT:  Negative for congestion and hearing loss.   Eyes:  Negative for discharge.  Respiratory:  Negative for cough, sputum production and shortness of breath.   Cardiovascular:  Negative for chest pain, palpitations and leg swelling.  Gastrointestinal:  Negative for abdominal pain, blood in stool, constipation, diarrhea, heartburn, nausea and vomiting.  Genitourinary:  Negative for dysuria, frequency, hematuria and urgency.  Musculoskeletal:  Negative for back pain, falls and myalgias.  Skin:  Negative for rash.  Neurological:  Negative for dizziness, sensory change, loss of consciousness, weakness and headaches.  Endo/Heme/Allergies:  Negative for environmental allergies. Does not bruise/bleed easily.  Psychiatric/Behavioral:  Negative for depression and suicidal ideas. The patient is not nervous/anxious and does not have insomnia.       Objective:     BP 124/70 (BP Location: Right Arm, Patient Position: Sitting, Cuff Size: Large)   Pulse (!) 54   Temp 98.1 F (36.7 C) (Oral)   Resp 18   Ht '5\' 9"'$  (1.753 m)   Wt 241 lb (109.3 kg)   SpO2 97%   BMI 35.59 kg/m  BP Readings from Last 3 Encounters:  03/05/22 124/70  02/18/22 116/66  01/19/22 102/69   Wt Readings from Last 3 Encounters:  03/05/22 241 lb (109.3 kg)  02/18/22 234 lb (106.1 kg)  01/19/22 233 lb 9.6 oz (106 kg)   SpO2 Readings from Last 3 Encounters:  03/05/22 97%  02/18/22  99%  01/19/22 97%      Physical Exam Vitals and nursing note reviewed.  Constitutional:      Appearance: He is well-developed.  HENT:     Head: Normocephalic and atraumatic.  Eyes:     Pupils: Pupils are equal, round, and reactive to light.  Neck:     Thyroid: No thyromegaly.  Cardiovascular:     Rate and Rhythm: Normal rate and regular rhythm.     Heart sounds:  No murmur heard. Pulmonary:     Effort: Pulmonary effort is normal. No respiratory distress.     Breath sounds: Normal breath sounds. No wheezing or rales.  Chest:     Chest wall: No tenderness.  Musculoskeletal:        General: No tenderness. Normal range of motion.     Cervical back: Normal range of motion and neck supple.  Skin:    General: Skin is warm and dry.  Neurological:     Mental Status: He is alert and oriented to person, place, and time.  Psychiatric:        Behavior: Behavior normal.        Thought Content: Thought content normal.        Judgment: Judgment normal.     No results found for any visits on 03/05/22.  Last CBC Lab Results  Component Value Date   WBC 5.6 09/03/2021   HGB 15.1 09/03/2021   HCT 45.9 09/03/2021   MCV 92.1 09/03/2021   MCH 30.6 09/26/2018   RDW 14.3 09/03/2021   PLT 157.0 94/80/1655   Last metabolic panel Lab Results  Component Value Date   GLUCOSE 94 09/03/2021   NA 141 09/03/2021   K 4.8 09/03/2021   CL 105 09/03/2021   CO2 29 09/03/2021   BUN 18 09/03/2021   CREATININE 0.99 09/03/2021   GFRNONAA 64 10/22/2019   CALCIUM 9.3 09/03/2021   PROT 6.4 09/03/2021   ALBUMIN 4.3 09/03/2021   LABGLOB 2.2 10/22/2019   AGRATIO 2.0 10/22/2019   BILITOT 0.9 09/03/2021   ALKPHOS 52 09/03/2021   AST 13 09/03/2021   ALT 14 09/03/2021   ANIONGAP 4 (L) 09/26/2018   Last lipids Lab Results  Component Value Date   CHOL 121 09/03/2021   HDL 47.40 09/03/2021   LDLCALC 61 09/03/2021   TRIG 61.0 09/03/2021   CHOLHDL 3 09/03/2021   Last hemoglobin A1c Lab Results  Component Value Date   HGBA1C 5.7 09/03/2021   Last thyroid functions Lab Results  Component Value Date   TSH 1.060 12/14/2017   T3TOTAL 127 12/14/2017   T4TOTAL 6.9 10/03/2015   Last vitamin D Lab Results  Component Value Date   VD25OH 66.83 09/03/2021   Last vitamin B12 and Folate Lab Results  Component Value Date   VZSMOLMB86 754 03/03/2020      The  ASCVD Risk score (Arnett DK, et al., 2019) failed to calculate for the following reasons:   The valid total cholesterol range is 130 to 320 mg/dL    Assessment & Plan:   Problem List Items Addressed This Visit       Unprioritized   Vitamin D deficiency - Primary   Relevant Orders   VITAMIN D 25 Hydroxy (Vit-D Deficiency, Fractures)   Urinary frequency   Relevant Orders   PSA   Morbid obesity ([place])    Con't diet and exercise       Hyperlipidemia    Tolerating statin, encouraged heart healthy diet, avoid trans fats, minimize  simple carbs and saturated fats. Increase exercise as tolerated       Relevant Orders   Lipid panel   TSH   Comprehensive metabolic panel   CBC with Differential/Platelet   Essential hypertension    Well controlled, no changes to meds. Encouraged heart healthy diet such as the DASH diet and exercise as tolerated.        Relevant Orders   TSH   Comprehensive metabolic panel   CBC with Differential/Platelet   Diabetes mellitus ([place])   Relevant Orders   Hemoglobin A1c   Class 3 severe obesity with serious comorbidity and body mass index (BMI) of 40.0 to 44.9 in adult [facility])   Relevant Orders   Insulin, random   Other Visit Diagnoses     Obstructive sleep apnea       Bradycardia       Relevant Orders   Thyroid Panel With TSH   Multiple thyroid nodules       Relevant Orders   Thyroid Panel With TSH       Return in about 6 months (around 09/04/2022), or if symptoms worsen or fail to improve, for hypertension, hyperlipidemia, diabetes II.    [name], DO

## 2022-03-06 LAB — THYROID PANEL WITH TSH
Free Thyroxine Index: 3 (ref 1.4–3.8)
T3 Uptake: 33 % (ref 22–35)
T4, Total: 9.2 ug/dL (ref 4.9–10.5)
TSH: 0.64 mIU/L (ref 0.40–4.50)

## 2022-03-06 LAB — INSULIN, RANDOM: Insulin: 9.6 u[IU]/mL

## 2022-03-09 DIAGNOSIS — M1712 Unilateral primary osteoarthritis, left knee: Secondary | ICD-10-CM | POA: Diagnosis not present

## 2022-03-09 DIAGNOSIS — M170 Bilateral primary osteoarthritis of knee: Secondary | ICD-10-CM | POA: Diagnosis not present

## 2022-03-09 DIAGNOSIS — M1711 Unilateral primary osteoarthritis, right knee: Secondary | ICD-10-CM | POA: Diagnosis not present

## 2022-03-12 ENCOUNTER — Other Ambulatory Visit: Payer: Self-pay | Admitting: Family Medicine

## 2022-03-24 ENCOUNTER — Ambulatory Visit (INDEPENDENT_AMBULATORY_CARE_PROVIDER_SITE_OTHER): Payer: Medicare Other | Admitting: Family Medicine

## 2022-03-24 ENCOUNTER — Encounter (INDEPENDENT_AMBULATORY_CARE_PROVIDER_SITE_OTHER): Payer: Self-pay | Admitting: Family Medicine

## 2022-03-24 ENCOUNTER — Encounter: Payer: Self-pay | Admitting: Family Medicine

## 2022-03-24 VITALS — BP 119/61 | HR 54 | Temp 97.9°F | Ht 69.0 in | Wt 235.0 lb

## 2022-03-24 DIAGNOSIS — E669 Obesity, unspecified: Secondary | ICD-10-CM

## 2022-03-24 DIAGNOSIS — E559 Vitamin D deficiency, unspecified: Secondary | ICD-10-CM

## 2022-03-24 DIAGNOSIS — Z6834 Body mass index (BMI) 34.0-34.9, adult: Secondary | ICD-10-CM

## 2022-03-24 DIAGNOSIS — E1169 Type 2 diabetes mellitus with other specified complication: Secondary | ICD-10-CM

## 2022-03-24 DIAGNOSIS — Z7985 Long-term (current) use of injectable non-insulin antidiabetic drugs: Secondary | ICD-10-CM | POA: Diagnosis not present

## 2022-03-24 DIAGNOSIS — Z794 Long term (current) use of insulin: Secondary | ICD-10-CM

## 2022-03-24 DIAGNOSIS — L739 Follicular disorder, unspecified: Secondary | ICD-10-CM

## 2022-03-24 MED ORDER — CLINDAMYCIN PHOSPHATE 1 % EX LOTN: TOPICAL_LOTION | Freq: Two times a day (BID) | CUTANEOUS | 2 refills | Status: AC

## 2022-03-24 MED ORDER — TIRZEPATIDE 7.5 MG/0.5ML ~~LOC~~ SOAJ: 7.5000 mg | SUBCUTANEOUS | 0 refills | Status: DC

## 2022-03-25 ENCOUNTER — Encounter (INDEPENDENT_AMBULATORY_CARE_PROVIDER_SITE_OTHER): Payer: Self-pay | Admitting: Family Medicine

## 2022-03-25 ENCOUNTER — Other Ambulatory Visit: Payer: Self-pay | Admitting: Family Medicine

## 2022-03-25 DIAGNOSIS — E1165 Type 2 diabetes mellitus with hyperglycemia: Secondary | ICD-10-CM

## 2022-03-25 MED ORDER — METFORMIN HCL 500 MG PO TABS: 500.0000 mg | ORAL_TABLET | Freq: Every day | ORAL | 0 refills | Status: DC

## 2022-03-25 NOTE — Telephone Encounter (Signed)
Ok to rf x 1

## 2022-03-31 DIAGNOSIS — L738 Other specified follicular disorders: Secondary | ICD-10-CM | POA: Diagnosis not present

## 2022-03-31 DIAGNOSIS — D485 Neoplasm of uncertain behavior of skin: Secondary | ICD-10-CM | POA: Diagnosis not present

## 2022-03-31 DIAGNOSIS — C44319 Basal cell carcinoma of skin of other parts of face: Secondary | ICD-10-CM | POA: Diagnosis not present

## 2022-04-06 NOTE — Progress Notes (Signed)
Chief Complaint:   OBESITY Eric [name] is here to discuss his progress with his obesity treatment plan along with follow-up of his obesity related diagnoses. Eric [name] is on the Category 3 Plan and states he is following his eating plan approximately 50% of the time. Eric [name] states he is walking various times per week.  Today's visit was #: 10 Starting weight: 288 lbs Starting date: 12/14/2017 Today's weight: 235 lbs Today's date: 03/24/2022 Total lbs lost to date: 53 Total lbs lost since last in-office visit: 0  Interim History: Eric [name] did well with minimizing holiday weight gain.  He missed breakfast at times when he was out duck hunting, but he tried to portion control.  He would like to lose more weight but he is uncertain that he is willing to keep a food journal.  Subjective:   1. Type 2 diabetes mellitus with other specified complication, with long-term current use of insulin (HCC) Eric [name] is working on his diet and exercise.  He is struggling with weight loss and he wonders if an increase in dose could help.  2. Vitamin D deficiency Eric [name] is stable on vitamin D, and he notes he has extra right now.  Assessment/Plan:   1. Type 2 diabetes mellitus with other specified complication, with long-term current use of insulin (HCC) Eric [name] agreed to increase Mounjaro to 7.5 mg once weekly, with a 90-day refill.  He will continue metformin, and he will keep a strict food journal.  - tirzepatide (MOUNJARO) 7.5 MG/0.5ML Pen; Inject 7.5 mg into the skin once a week.  Dispense: 6 mL; Refill: 0  2. Vitamin D deficiency Eric [name] will continue vitamin D prescription, and we will recheck labs in 1 to 2 months.  3. Obesity, Current BMI 34.7 Eric [name] is currently in the action stage of change. As such, his goal is to continue with weight loss efforts. He has agreed to the Category 3 Plan or keeping a food journal and adhering to recommended goals of 1300-1600 calories and 100+ grams of protein  daily.   Exercise goals: As is.   Behavioral modification strategies: keeping a strict food journal.  Eric [name] has agreed to follow-up with our clinic in 4 weeks. He was informed of the importance of frequent follow-up visits to maximize his success with intensive lifestyle modifications for his multiple health conditions.   Objective:   Blood pressure 119/61, pulse (!) 54, temperature 97.9 F (36.6 C), height '5\' 9"'$  (1.753 m), weight 235 lb (106.6 kg), SpO2 94 %. Body mass index is 34.7 kg/m.  General: Cooperative, alert, well developed, in no acute distress. HEENT: Conjunctivae and lids unremarkable. Cardiovascular: Regular rhythm.  Lungs: Normal work of breathing. Neurologic: No focal deficits.   Lab Results  Component Value Date   CREATININE 0.96 03/05/2022   BUN 20 03/05/2022   NA 141 03/05/2022   K 4.2 03/05/2022   CL 103 03/05/2022   CO2 30 03/05/2022   Lab Results  Component Value Date   ALT 18 03/05/2022   AST 12 03/05/2022   ALKPHOS 55 03/05/2022   BILITOT 0.7 03/05/2022   Lab Results  Component Value Date   HGBA1C 5.3 03/05/2022   HGBA1C 5.7 09/03/2021   HGBA1C 5.7 03/05/2021   HGBA1C 5.6 09/01/2020   HGBA1C 5.4 03/03/2020   Lab Results  Component Value Date   INSULIN 9.0 10/22/2019   INSULIN 21.3 12/14/2017   Lab Results  Component Value Date   TSH 0.58 03/05/2022   TSH 0.64 03/05/2022  Lab Results  Component Value Date   CHOL 127 03/05/2022   HDL 52.10 03/05/2022   LDLCALC 63 03/05/2022   TRIG 64.0 03/05/2022   CHOLHDL 2 03/05/2022   Lab Results  Component Value Date   VD25OH 70.37 03/05/2022   VD25OH 66.83 09/03/2021   VD25OH 43.17 03/05/2021   Lab Results  Component Value Date   WBC 5.6 03/05/2022   HGB 15.6 03/05/2022   HCT 46.0 03/05/2022   MCV 91.7 03/05/2022   PLT 186.0 03/05/2022   No results found for: "IRON", "TIBC", "FERRITIN"  Attestation Statements:   Reviewed by clinician on day of visit: allergies, medications,  problem list, medical history, surgical history, family history, social history, and previous encounter notes.   I, Trixie Dredge, am acting as transcriptionist for [name], MD.  I have reviewed the above documentation for accuracy and completeness, and I agree with the above. -  [name], MD

## 2022-04-07 DIAGNOSIS — L905 Scar conditions and fibrosis of skin: Secondary | ICD-10-CM | POA: Diagnosis not present

## 2022-04-07 DIAGNOSIS — M1711 Unilateral primary osteoarthritis, right knee: Secondary | ICD-10-CM | POA: Diagnosis not present

## 2022-04-07 DIAGNOSIS — M170 Bilateral primary osteoarthritis of knee: Secondary | ICD-10-CM | POA: Diagnosis not present

## 2022-04-07 DIAGNOSIS — M1712 Unilateral primary osteoarthritis, left knee: Secondary | ICD-10-CM | POA: Diagnosis not present

## 2022-04-08 ENCOUNTER — Encounter: Payer: Self-pay | Admitting: Family Medicine

## 2022-04-08 DIAGNOSIS — K219 Gastro-esophageal reflux disease without esophagitis: Secondary | ICD-10-CM

## 2022-04-08 MED ORDER — PANTOPRAZOLE SODIUM 40 MG PO TBEC: 40.0000 mg | DELAYED_RELEASE_TABLET | Freq: Every day | ORAL | 1 refills | Status: DC

## 2022-04-14 DIAGNOSIS — M1712 Unilateral primary osteoarthritis, left knee: Secondary | ICD-10-CM | POA: Diagnosis not present

## 2022-04-14 DIAGNOSIS — M170 Bilateral primary osteoarthritis of knee: Secondary | ICD-10-CM | POA: Diagnosis not present

## 2022-04-14 DIAGNOSIS — M1711 Unilateral primary osteoarthritis, right knee: Secondary | ICD-10-CM | POA: Diagnosis not present

## 2022-04-21 ENCOUNTER — Encounter (INDEPENDENT_AMBULATORY_CARE_PROVIDER_SITE_OTHER): Payer: Self-pay | Admitting: Family Medicine

## 2022-04-21 ENCOUNTER — Ambulatory Visit (INDEPENDENT_AMBULATORY_CARE_PROVIDER_SITE_OTHER): Payer: Medicare Other | Admitting: Family Medicine

## 2022-04-21 VITALS — BP 104/62 | HR 63 | Temp 98.2°F | Ht 69.0 in | Wt 229.0 lb

## 2022-04-21 DIAGNOSIS — Z6833 Body mass index (BMI) 33.0-33.9, adult: Secondary | ICD-10-CM | POA: Diagnosis not present

## 2022-04-21 DIAGNOSIS — E7849 Other hyperlipidemia: Secondary | ICD-10-CM

## 2022-04-21 DIAGNOSIS — Z7984 Long term (current) use of oral hypoglycemic drugs: Secondary | ICD-10-CM | POA: Diagnosis not present

## 2022-04-21 DIAGNOSIS — Z794 Long term (current) use of insulin: Secondary | ICD-10-CM

## 2022-04-21 DIAGNOSIS — Z7985 Long-term (current) use of injectable non-insulin antidiabetic drugs: Secondary | ICD-10-CM

## 2022-04-21 DIAGNOSIS — E1169 Type 2 diabetes mellitus with other specified complication: Secondary | ICD-10-CM

## 2022-04-21 DIAGNOSIS — E669 Obesity, unspecified: Secondary | ICD-10-CM

## 2022-04-21 MED ORDER — METFORMIN HCL 500 MG PO TABS: 500.0000 mg | ORAL_TABLET | Freq: Every day | ORAL | 0 refills | Status: DC

## 2022-05-04 NOTE — Progress Notes (Signed)
Chief Complaint:   OBESITY Eric [name] is here to discuss his progress with his obesity treatment plan along with follow-up of his obesity related diagnoses. Eric [name] is on the Category 3 Plan and keeping a food journal and adhering to recommended goals of 1300-1600 calories and 100+ grams of protein and states he is following his eating plan approximately 90% of the time. Eric [name] states he is walking and doing leg exercises for 60 minutes 3 times per week.  Today's visit was #: 42 Starting weight: 288 lbs Starting date: 12/14/2017 Today's weight: 229 lbs Today's date: 04/21/2022 Total lbs lost to date: 59 Total lbs lost since last in-office visit: 6  Interim History: Eric [name] has done very well with his weight loss in the last 4 weeks. He was very diligent in journaling and meeting his calorie and protein goals. His hunger was controlled.   Subjective:   1. Other hyperlipidemia Eric [name]'s lipids are well controlled with no side effects noted with statin. He is doing especially well with diet, exercise, and weight loss. I discussed labs with the patient today.   2. Type 2 diabetes mellitus with other specified complication, with long-term current use of insulin (HCC) Eric [name] is on metformin and Ozempic, and his A1c is very well controlled at 5.3. He denies nausea or vomiting. He will change over to [facility] when his Ozempic runs out. I discussed labs with the patient today.   Assessment/Plan:   1. Other hyperlipidemia Eric [name] will continue Lipitor and his low cholesterol diet.   2. Type 2 diabetes mellitus with other specified complication, with long-term current use of insulin (HCC) We will refill metformin for 1 month. Eric [name] will continue Ozempic and change to [facility] when his is finished.   - metFORMIN (GLUCOPHAGE) 500 MG tablet; Take 1 tablet (500 mg total) by mouth daily with breakfast.  Dispense: 30 tablet; Refill: 0  3. BMI 33.0-33.9,adult  4. Obesity, Beginning BMI  43.79 Eric [name] is currently in the action stage of change. As such, his goal is to continue with weight loss efforts. He has agreed to keeping a food journal and adhering to recommended goals of 1300-1600 calories and 100+ grams of protein daily.   Exercise goals: As is.   Behavioral modification strategies: keeping a strict food journal.  Eric [name] has agreed to follow-up with our clinic in 4 weeks. He was informed of the importance of frequent follow-up visits to maximize his success with intensive lifestyle modifications for his multiple health conditions. We will continue to manage this complex ongoing medical condition.  Objective:   Blood pressure 104/62, pulse 63, temperature 98.2 F (36.8 C), height 5' 9"$  (1.753 m), weight 229 lb (103.9 kg), SpO2 96 %. Body mass index is 33.82 kg/m.  General: Cooperative, alert, well developed, in no acute distress. HEENT: Conjunctivae and lids unremarkable. Cardiovascular: Regular rhythm.  Lungs: Normal work of breathing. Neurologic: No focal deficits.   Lab Results  Component Value Date   CREATININE 0.96 03/05/2022   BUN 20 03/05/2022   NA 141 03/05/2022   K 4.2 03/05/2022   CL 103 03/05/2022   CO2 30 03/05/2022   Lab Results  Component Value Date   ALT 18 03/05/2022   AST 12 03/05/2022   ALKPHOS 55 03/05/2022   BILITOT 0.7 03/05/2022   Lab Results  Component Value Date   HGBA1C 5.3 03/05/2022   HGBA1C 5.7 09/03/2021   HGBA1C 5.7 03/05/2021   HGBA1C 5.6 09/01/2020   HGBA1C 5.4 03/03/2020   Lab  Results  Component Value Date   INSULIN 9.0 10/22/2019   INSULIN 21.3 12/14/2017   Lab Results  Component Value Date   TSH 0.58 03/05/2022   TSH 0.64 03/05/2022   Lab Results  Component Value Date   CHOL 127 03/05/2022   HDL 52.10 03/05/2022   LDLCALC 63 03/05/2022   TRIG 64.0 03/05/2022   CHOLHDL 2 03/05/2022   Lab Results  Component Value Date   VD25OH 70.37 03/05/2022   VD25OH 66.83 09/03/2021   VD25OH 43.17  03/05/2021   Lab Results  Component Value Date   WBC 5.6 03/05/2022   HGB 15.6 03/05/2022   HCT 46.0 03/05/2022   MCV 91.7 03/05/2022   PLT 186.0 03/05/2022   No results found for: "IRON", "TIBC", "FERRITIN"  Attestation Statements:   Reviewed by clinician on day of visit: allergies, medications, problem list, medical history, surgical history, family history, social history, and previous encounter notes.   I, Trixie Dredge, am acting as transcriptionist for [name], MD.  I have reviewed the above documentation for accuracy and completeness, and I agree with the above. -  [name], MD

## 2022-05-18 DIAGNOSIS — M170 Bilateral primary osteoarthritis of knee: Secondary | ICD-10-CM | POA: Diagnosis not present

## 2022-05-19 ENCOUNTER — Encounter (INDEPENDENT_AMBULATORY_CARE_PROVIDER_SITE_OTHER): Payer: Self-pay | Admitting: Family Medicine

## 2022-05-19 ENCOUNTER — Ambulatory Visit (INDEPENDENT_AMBULATORY_CARE_PROVIDER_SITE_OTHER): Payer: Medicare Other | Admitting: Family Medicine

## 2022-05-19 VITALS — BP 110/66 | HR 52 | Temp 98.0°F | Ht 69.0 in | Wt 231.0 lb

## 2022-05-19 DIAGNOSIS — E1169 Type 2 diabetes mellitus with other specified complication: Secondary | ICD-10-CM | POA: Diagnosis not present

## 2022-05-19 DIAGNOSIS — Z7984 Long term (current) use of oral hypoglycemic drugs: Secondary | ICD-10-CM | POA: Diagnosis not present

## 2022-05-19 DIAGNOSIS — Z6834 Body mass index (BMI) 34.0-34.9, adult: Secondary | ICD-10-CM | POA: Insufficient documentation

## 2022-05-19 DIAGNOSIS — M174 Other bilateral secondary osteoarthritis of knee: Secondary | ICD-10-CM

## 2022-05-19 DIAGNOSIS — Z794 Long term (current) use of insulin: Secondary | ICD-10-CM | POA: Diagnosis not present

## 2022-05-19 DIAGNOSIS — M1990 Unspecified osteoarthritis, unspecified site: Secondary | ICD-10-CM | POA: Insufficient documentation

## 2022-05-19 DIAGNOSIS — E669 Obesity, unspecified: Secondary | ICD-10-CM | POA: Diagnosis not present

## 2022-05-19 MED ORDER — METFORMIN HCL 500 MG PO TABS: 500.0000 mg | ORAL_TABLET | Freq: Every day | ORAL | 0 refills | Status: DC

## 2022-06-02 DIAGNOSIS — L905 Scar conditions and fibrosis of skin: Secondary | ICD-10-CM | POA: Diagnosis not present

## 2022-06-07 NOTE — Progress Notes (Signed)
Chief Complaint:   OBESITY Eric [name] is here to discuss his progress with his obesity treatment plan along with follow-up of his obesity related diagnoses. Eric [name] is on keeping a food journal and adhering to recommended goals of 1300-1600 calories and 100+ grams of protein and states he is following his eating plan approximately 80% of the time. Eric [name] states he is doing leg raises and weights for 20 minutes 3-5 times per week.  Today's visit was #: 77 Starting weight: 288 lbs Starting date: 12/14/2017 Today's weight: 231 lbs Today's date: 05/19/2022 Total lbs lost to date: 57 Total lbs lost since last in-office visit: 0  Interim History: Eric [name] continues to work on his weight loss.  He was on a hunting trip and increased eating out.  He is retaining some water weight, likely from some increase in sodium recently.  Subjective:   1. Type 2 diabetes mellitus with other specified complication, with long-term current use of insulin (HCC) Eric [name] is working on his diet and weight loss, and he is doing well with metformin with no nausea, vomiting, or hypoglycemia noted.  2. Other secondary osteoarthritis of both knees Eric [name] is getting ready to have both knee replaced this year.  He has questions about pre and post-op nutrition.  Assessment/Plan:   1. Type 2 diabetes mellitus with other specified complication, with long-term current use of insulin (HCC) Eric [name] will continue metformin, and we will refill for 1 month.  - metFORMIN (GLUCOPHAGE) 500 MG tablet; Take 1 tablet (500 mg total) by mouth daily with breakfast.  Dispense: 30 tablet; Refill: 0  2. Other secondary osteoarthritis of both knees Eric [name] was encouraged to maximize his protein both before and while recovering from surgery to improve recovery time and decrease the risk of infection.  3. BMI 34.0-34.9,adult  4. Obesity, Beginning BMI 43.79 Eric [name] is currently in the action stage of change. As such, his goal is to  continue with weight loss efforts. He has agreed to keeping a food journal and adhering to recommended goals of 1300-1600 calories and 100+ grams of protein daily.   Exercise goals: As is.   Behavioral modification strategies: increasing lean protein intake and meal planning and cooking strategies.  Eric [name] has agreed to follow-up with our clinic in 4 weeks. He was informed of the importance of frequent follow-up visits to maximize his success with intensive lifestyle modifications for his multiple health conditions.   Objective:   Blood pressure 110/66, pulse (!) 52, temperature 98 F (36.7 C), height 5\' 9"  (1.753 m), weight 231 lb (104.8 kg), SpO2 96 %. Body mass index is 34.11 kg/m.  Lab Results  Component Value Date   CREATININE 0.96 03/05/2022   BUN 20 03/05/2022   NA 141 03/05/2022   K 4.2 03/05/2022   CL 103 03/05/2022   CO2 30 03/05/2022   Lab Results  Component Value Date   ALT 18 03/05/2022   AST 12 03/05/2022   ALKPHOS 55 03/05/2022   BILITOT 0.7 03/05/2022   Lab Results  Component Value Date   HGBA1C 5.3 03/05/2022   HGBA1C 5.7 09/03/2021   HGBA1C 5.7 03/05/2021   HGBA1C 5.6 09/01/2020   HGBA1C 5.4 03/03/2020   Lab Results  Component Value Date   INSULIN 9.0 10/22/2019   INSULIN 21.3 12/14/2017   Lab Results  Component Value Date   TSH 0.58 03/05/2022   TSH 0.64 03/05/2022   Lab Results  Component Value Date   CHOL 127 03/05/2022   HDL 52.10  03/05/2022   LDLCALC 63 03/05/2022   TRIG 64.0 03/05/2022   CHOLHDL 2 03/05/2022   Lab Results  Component Value Date   VD25OH 70.37 03/05/2022   VD25OH 66.83 09/03/2021   VD25OH 43.17 03/05/2021   Lab Results  Component Value Date   WBC 5.6 03/05/2022   HGB 15.6 03/05/2022   HCT 46.0 03/05/2022   MCV 91.7 03/05/2022   PLT 186.0 03/05/2022   No results found for: "IRON", "TIBC", "FERRITIN"  Attestation Statements:   Reviewed by clinician on day of visit: allergies, medications, problem list,  medical history, surgical history, family history, social history, and previous encounter notes.  Time spent on visit including pre-visit chart review and post-visit care and charting was 40 minutes.   I, Trixie Dredge, am acting as transcriptionist for [name], MD.  I have reviewed the above documentation for accuracy and completeness, and I agree with the above. -  [name], MD

## 2022-06-14 ENCOUNTER — Other Ambulatory Visit (INDEPENDENT_AMBULATORY_CARE_PROVIDER_SITE_OTHER): Payer: Self-pay | Admitting: Family Medicine

## 2022-06-14 DIAGNOSIS — E1169 Type 2 diabetes mellitus with other specified complication: Secondary | ICD-10-CM

## 2022-06-16 ENCOUNTER — Encounter (INDEPENDENT_AMBULATORY_CARE_PROVIDER_SITE_OTHER): Payer: Self-pay | Admitting: Family Medicine

## 2022-06-16 ENCOUNTER — Ambulatory Visit (INDEPENDENT_AMBULATORY_CARE_PROVIDER_SITE_OTHER): Payer: Medicare Other | Admitting: Family Medicine

## 2022-06-16 VITALS — BP 99/64 | HR 62 | Temp 98.0°F | Ht 69.0 in | Wt 223.0 lb

## 2022-06-16 DIAGNOSIS — E669 Obesity, unspecified: Secondary | ICD-10-CM | POA: Diagnosis not present

## 2022-06-16 DIAGNOSIS — Z7985 Long-term (current) use of injectable non-insulin antidiabetic drugs: Secondary | ICD-10-CM

## 2022-06-16 DIAGNOSIS — Z7984 Long term (current) use of oral hypoglycemic drugs: Secondary | ICD-10-CM | POA: Diagnosis not present

## 2022-06-16 DIAGNOSIS — E1169 Type 2 diabetes mellitus with other specified complication: Secondary | ICD-10-CM

## 2022-06-16 DIAGNOSIS — E559 Vitamin D deficiency, unspecified: Secondary | ICD-10-CM | POA: Diagnosis not present

## 2022-06-16 DIAGNOSIS — Z794 Long term (current) use of insulin: Secondary | ICD-10-CM

## 2022-06-16 DIAGNOSIS — Z6833 Body mass index (BMI) 33.0-33.9, adult: Secondary | ICD-10-CM | POA: Diagnosis not present

## 2022-06-16 MED ORDER — TIRZEPATIDE 7.5 MG/0.5ML ~~LOC~~ SOAJ: 7.5000 mg | SUBCUTANEOUS | 0 refills | Status: DC

## 2022-06-16 MED ORDER — VITAMIN D (ERGOCALCIFEROL) 1.25 MG (50000 UNIT) PO CAPS: ORAL_CAPSULE | ORAL | 0 refills | Status: DC

## 2022-06-16 MED ORDER — METFORMIN HCL 500 MG PO TABS: 500.0000 mg | ORAL_TABLET | Freq: Every day | ORAL | 0 refills | Status: DC

## 2022-06-21 NOTE — Progress Notes (Unsigned)
Chief Complaint:   OBESITY Eric [name] is here to discuss his progress with his obesity treatment plan along with follow-up of his obesity related diagnoses. Eric [name] is on keeping a food journal and adhering to recommended goals of 1300-1600 calories and 100+ grams of protein and states he is following his eating plan approximately 80% of the time. Eric [name] states he is doing arms and legs strengthening for 15 minutes 7 times per week.  Today's visit was #: 57 Starting weight: 288 lbs Starting date: 12/14/2017 Today's weight: 223 lbs Today's date: 06/16/2022 Total lbs lost to date: 65 Total lbs lost since last in-office visit: 8  Interim History: Eric [name] has done much better with journaling his food.  He had extra challenges but he remained mindful of his food choices.  He has been meeting his protein goals regularly.  Subjective:   1. Type 2 diabetes mellitus with other specified complication, with long-term current use of insulin (HCC) Eric [name] continues to work on his diet and he is stable on his medications.  His polyphagia has improved, and he is doing well with meeting his nutrition goals.  2. Vitamin D deficiency Eric [name] is on vitamin D, and his vitamin D level is at goal.  He denies nausea, vomiting, or muscle weakness.  Assessment/Plan:   1. Type 2 diabetes mellitus with other specified complication, with long-term current use of insulin (HCC) Eric [name] will continue his medications, and we will refill metformin for 1 month and refill Mounjaro for 90 days.  - tirzepatide (MOUNJARO) 7.5 MG/0.5ML Pen; Inject 7.5 mg into the skin once a week.  Dispense: 6 mL; Refill: 0 - metFORMIN (GLUCOPHAGE) 500 MG tablet; Take 1 tablet (500 mg total) by mouth daily with breakfast.  Dispense: 30 tablet; Refill: 0  2. Vitamin D deficiency Eric [name] will continue prescription vitamin D, and we will refill for 90 days.  - Vitamin D, Ergocalciferol, (DRISDOL) 1.25 MG (50000 UNIT) CAPS capsule; TAKE  ONE CAPSULE BY MOUTH EVERY 7 DAYS  Dispense: 12 capsule; Refill: 0  3. BMI 33.0-33.9,adult  4. Obesity, Beginning BMI 43.79 Eric [name] is currently in the action stage of change. As such, his goal is to continue with weight loss efforts. He has agreed to keeping a food journal and adhering to recommended goals of 1300-1600 calories and 100+ grams of protein daily.   Exercise goals: As is.   Behavioral modification strategies: increasing lean protein intake.  Eric [name] has agreed to follow-up with our clinic in 4 weeks. He was informed of the importance of frequent follow-up visits to maximize his success with intensive lifestyle modifications for his multiple health conditions.   Objective:   Blood pressure 99/64, pulse 62, temperature 98 F (36.7 C), height 5\' 9"  (1.753 m), weight 223 lb (101.2 kg), SpO2 96 %. Body mass index is 32.93 kg/m.  Lab Results  Component Value Date   CREATININE 0.96 03/05/2022   BUN 20 03/05/2022   NA 141 03/05/2022   K 4.2 03/05/2022   CL 103 03/05/2022   CO2 30 03/05/2022   Lab Results  Component Value Date   ALT 18 03/05/2022   AST 12 03/05/2022   ALKPHOS 55 03/05/2022   BILITOT 0.7 03/05/2022   Lab Results  Component Value Date   HGBA1C 5.3 03/05/2022   HGBA1C 5.7 09/03/2021   HGBA1C 5.7 03/05/2021   HGBA1C 5.6 09/01/2020   HGBA1C 5.4 03/03/2020   Lab Results  Component Value Date   INSULIN 9.0 10/22/2019   INSULIN 21.3  12/14/2017   Lab Results  Component Value Date   TSH 0.58 03/05/2022   TSH 0.64 03/05/2022   Lab Results  Component Value Date   CHOL 127 03/05/2022   HDL 52.10 03/05/2022   LDLCALC 63 03/05/2022   TRIG 64.0 03/05/2022   CHOLHDL 2 03/05/2022   Lab Results  Component Value Date   VD25OH 70.37 03/05/2022   VD25OH 66.83 09/03/2021   VD25OH 43.17 03/05/2021   Lab Results  Component Value Date   WBC 5.6 03/05/2022   HGB 15.6 03/05/2022   HCT 46.0 03/05/2022   MCV 91.7 03/05/2022   PLT 186.0 03/05/2022    No results found for: "IRON", "TIBC", "FERRITIN"  Attestation Statements:   Reviewed by clinician on day of visit: allergies, medications, problem list, medical history, surgical history, family history, social history, and previous encounter notes.  I have personally spent 40 minutes total time today in preparation, patient care, and documentation for this visit, including the following: review of clinical lab tests; review of medical tests/procedures/services.   I, Trixie Dredge, am acting as transcriptionist for [name], MD.  I have reviewed the above documentation for accuracy and completeness, and I agree with the above. -  [name], MD

## 2022-06-24 NOTE — Addendum Note (Signed)
Addended by: [name] D on: 06/24/2022 07:49 AM   Modules accepted: Level of Service

## 2022-07-07 ENCOUNTER — Encounter: Payer: Self-pay | Admitting: Neurology

## 2022-07-07 ENCOUNTER — Ambulatory Visit (INDEPENDENT_AMBULATORY_CARE_PROVIDER_SITE_OTHER): Payer: Medicare Other | Admitting: Neurology

## 2022-07-07 VITALS — BP 125/70 | HR 55 | Ht 69.0 in | Wt 231.6 lb

## 2022-07-07 DIAGNOSIS — G250 Essential tremor: Secondary | ICD-10-CM | POA: Diagnosis not present

## 2022-07-07 DIAGNOSIS — G4733 Obstructive sleep apnea (adult) (pediatric): Secondary | ICD-10-CM

## 2022-07-07 NOTE — Progress Notes (Signed)
Subjective:    Patient ID: Eric [name] is a 77 y.o. male.  HPI    Interim history:   Eric [name] he is a 77 year old right-handed gentleman with an complex medical history of hypertension, hyperlipidemia, insulin resistance, osteoarthritis, palpitations, allergies, and tremors, chronic low back pain, BPH, sleep apnea, vitamin D deficiency, edema, and obesity, who presents for consultation of his OSA and essential tremor. I last saw him on 06/30/2021, at which time he was compliant with his autoPAP. He had received a new machine in February 2023. He was working on weight loss. He had a stable tremor.  Today, 07/07/22: I reviewed his autoPAP compliance data from 06/06/22 through 07/05/2022, which is a total of 30 days, during which time he used his machine every night with percent use days greater than 4 hours at 100%, indicating superb compliance, average usage of 8 hours and 37 minutes, residual AHI at goal at 0.2/h, leak acceptable with the 95th percentile at 16.5 L/min, leak improved in the past 9 days.  Average pressure for the 95th percentile at 8.1 cm on a pressure range of 7 to 13 cm with EPR of 1. He reports doing well, tremor is more or less stable but does get worse when he is cold or nervous or anxious.  He is bothered by the teeth chattering and also when he tries to pour liquids, he will spill at times.  He has been doing well otherwise.  He tries to stay active but is limited secondary to arthritis. He continues to work on weight loss and has lost at least 15 pounds since our last visit.  Interestingly, his average AutoPap pressure has gone down also probably secondary to weight loss, last year around this time his average AutoPap pressure was 9.5 cm.  His Epworth sleepiness score is 13 out of 24 today.  The patient's allergies, current medications, family history, past medical history, past social history, past surgical history and problem list were reviewed and updated as  appropriate.    Previously:   I saw him on 04/27/21, at which time we talked about symptomatic treatment options for tremors.  He was not really a good candidate for beta-blocker due to bradycardia.  We talked about utilizing Mysoline in low-dose down the road but he was advised that it is not always very successful for midline tremors such as jaw or head tremors or voice tremor.  He had struggled with low back pain.  He had received injections.  He was trying to hydrate well and limit his caffeine.  He had not started with a new AutoPap machine at the time, he was using a Industrial/product designer.   He received a new AutoPap machine in the interim on 05/07/2021, he has a ResMed air sense 11 AutoSet machine.   I reviewed his AutoPap compliance data from 05/07/2021 through 06/05/2021, which is a total of 30 days, during which time he used his machine every night with percent use days greater than 4 hours at 100%, indicating superb compliance with an average usage of 7 hours and 40 minutes, residual AHI 0.4/h, 95th percentile pressure at 9.5 cm with a range of 7 to 13 cm, leak acceptable with the 95th percentile at 10.1 L/min.       He had a home sleep test in the interim on 03/17/2021 which showed evidence of moderate obstructive sleep apnea with an AHI of 19.2/h, O2 nadir 86% with mild to moderate snoring detected.  I wrote for a new  Pap machine.     03/03/21: (He) was previously diagnosed with obstructive sleep apnea and placed on PAP therapy.  I was able to review a sleep study result from 12/08/2007.  Study was conducted at the [facility] sleep center.  This was a CPAP titration study, he was titrated from a pressure of 4 cm to 15 cm.  I was also able to review a result from a baseline polysomnogram conducted at the same center located in [place], [place], study date of 11/19/2007.  Sleep latency was 7.4 minutes, REM latency 90.9 minutes, sleep efficiency 94.4%.  He achieved very little  slow-wave sleep and REM sleep was 9.5%, average oxygen saturation was 96.4%, desaturation nadir was 79%.  Total AHI was 15.8/h.  He has an older CPAP machine.  He is in need for a replacement.  His Epworth sleepiness score is 11 out of 24, fatigue severity score is 29 out of 63.  In fact, his previous PAP stopped working and he has a Industrial/product designer since 12/10/2020.  He is fully compliant with treatment and I was able to review compliance data from the last nearly 3 months.  Average AHI 0.9/h, at goal, average pressure for the 95th percentile at 10.5 cm, patient on the default range of AutoPap from 5 to 20 cm.  Leak acceptable, actually on the low side, with the 95th percentile at 3.6 L/min.  He reports that his original CPAP was set to 15 cm and then he received an AutoPap machine after that, this is his third machine essentially but he needs a replacement because this is a Industrial/product designer.  When he started treatment for sleep apnea he had significant improvement in his snoring, nocturia and daytime energy.  He has been working on weight loss and goes to the weight management clinic for the past 3 years.  As far as his hand tremor, his father had a tremor and paternal family history is positive for tremors.  He feels that his tremor is perhaps a little bit better since he stopped Wellbutrin.  His DME company is [employer].   04/24/20: 77 year old right-handed gentleman with an underlying medical history of diverticulosis, hypertension, hyperlipidemia, insulin resistance, vitamin D deficiency, edema, osteoarthritis, seasonal allergies, sleep apnea (uses CPAP), back pain, and obesity, who reports a longstanding history of approximately 10+ years maybe 12+ years of bilateral hand tremors.  His tremor has become worse over time.  Recently, when he started Wellbutrin, he felt that the tremor became worse so he stopped the Wellbutrin but it did not really affect the tremors.  He restarted the Wellbutrin and did not notice a  significant change.  He has noticed that caffeine affects his tremor.  His tremor is primarily noticeable when he holds something or uses his hands, sometimes he can suppress the tremor but not for long.  It is bilateral and he has also started noticing a quivering in his jaw and almost like a teeth chattering.  He recalls that his paternal grandfather had a severe hand tremor.  His father also had a tremor but he did not see his father very often.  Father died of congestive heart failure.  His dad lived to be 29 years old.  I reviewed your office note from 02/05/2020.  Patient has a sister and 1 brother.  He is the youngest of all 3.  His sister has Lewy body dementia and brother has prostate cancer, neither 1 have a significant or similar tremor.  He has  3 grown children, 2 sons and 1 daughter, as far as he knows, neither 1 has a tremor.  He is somewhat bothered by the tremor but not severely.  He drinks decaf coffee, about 2 cups/day and peach tea several servings per day.  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  He lives with his wife.    His Past Medical History Is Significant For: Past Medical History:  Diagnosis Date   Alcohol abuse    Allergy    Back pain    Cataract    forming   Colon polyps    Diverticulosis of colon    DJD (degenerative joint disease)    Dysrhythmia    Palpations from caffiene- reports he was having PACs due to slim fast wth caffeine - had full cardiac w/u and was told to cut back on caffeine;  now drink decaf bevrerages and no rpeort of recurrence    Enlarged prostate    GERD (gastroesophageal reflux disease)    Hearing loss    Hip osteoarthritis    Left   History of hay fever    Hx of cardiovascular stress test    Lexiscan Myoview (04/2013):  No ischemia, EF 61%; low risk   Hyperlipemia    Hypertension    Insulin resistance    om metfromin    Internal hemorrhoids without mention of complication    Leg edema    Neuroma of foot    left   Obesity     Osteoarthrosis, unspecified whether generalized or localized, unspecified site    Palpitations    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   Primary localized osteoarthrosis of right shoulder 05/25/2016   Recent weight loss    75lbs   Seasonal allergies    Sleep apnea    CPAP   Thyroid nodule    calcified thyroid nodules- bx negative    Tremor of both hands     His Past Surgical History Is Significant For: Past Surgical History:  Procedure Laterality Date   COLONOSCOPY     POLYPECTOMY     SHOULDER SURGERY  08/01/2007   right   TONSILLECTOMY     TOTAL HIP ARTHROPLASTY Left 03/28/2017   Procedure: TOTAL HIP ARTHROPLASTY ANTERIOR APPROACH;  Surgeon: Gean Birchwood, MD;  Location: [facility] OR;  Service: Orthopedics;  Laterality: Left;   TOTAL HIP ARTHROPLASTY Right 09/25/2018   Procedure: Right Anterior Hip Arthroplasty;  Surgeon: Gean Birchwood, MD;  Location: [facility];  Service: Orthopedics;  Laterality: Right;   TOTAL SHOULDER ARTHROPLASTY Left 10/14/2015   Procedure: LEFT TOTAL SHOULDER ARTHROPLASTY;  Surgeon: Teryl Lucy, MD;  Location: [facility] OR;  Service: Orthopedics;  Laterality: Left;   TOTAL SHOULDER ARTHROPLASTY Right 05/25/2016   Procedure: TOTAL SHOULDER ARTHROPLASTY;  Surgeon: Teryl Lucy, MD;  Location: [facility] OR;  Service: Orthopedics;  Laterality: Right;    His Family History Is Significant For: Family History  Problem Relation Age of Onset   Arthritis Mother    Cancer Mother 6       breast   Hyperlipidemia Mother    Colon polyps Mother 75   Hypertension Mother    Heart disease Mother    Depression Mother    Anxiety disorder Mother    Obesity Mother    Breast cancer Mother    Heart disease Father        CAD--passed away Aug 01, 2011 age 72   Hyperlipidemia Father    Hypertension Father    Obesity Father    Diabetes  Father    Depression Father    Sleep apnea Father    Cancer Sister 31       breast   Breast cancer Sister    Sleep apnea Sister    Cancer Brother 40        prostate   Prostate cancer Brother    Tremor Paternal Grandfather    Breast cancer Other    Prostate cancer Other    Colon cancer Neg Hx    Esophageal cancer Neg Hx    Rectal cancer Neg Hx    Stomach cancer Neg Hx     His Social History Is Significant For: Social History   Socioeconomic History   Marital status: Married    Spouse name: Selah Zelman    Number of children: 3   Years of education: Not on file   Highest education level: Not on file  Occupational History   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    Social Determinants of Health   Financial Resource Strain: Low Risk  (08/05/2021)   Overall Financial Resource Strain (CARDIA)    Difficulty of Paying Living Expenses: Not hard at all  Food Insecurity: No Food Insecurity (08/05/2021)   Hunger Vital Sign    Worried About Running Out of Food in the Last Year: Never true    Ran Out of Food in the Last Year: Never true  Transportation Needs: No Transportation Needs (08/05/2021)   PRAPARE - Administrator, Civil Service (Medical): No    Lack of Transportation (Non-Medical): No  Physical Activity: Sufficiently Active (08/05/2021)   Exercise Vital Sign    Days of Exercise per Week: 7 days    Minutes of Exercise per Session: 60 min  Stress: No Stress Concern Present (08/05/2021)   [name] of Occupational Health - Occupational Stress Questionnaire    Feeling of Stress : Not at all  Social Connections: Moderately Integrated (08/05/2021)   Social  Connection and Isolation Panel [NHANES]    Frequency of Communication with Friends and Family: Three times a week    Frequency of Social Gatherings with Friends and Family: Twice a week    Attends Religious Services: More than 4 times per year    Active Member of [employer] or Organizations: No    Attends Banker Meetings: Never    Marital Status: Married    His Allergies Are:  Allergies  Allergen Reactions   Lisinopril Cough  :   His Current Medications Are:  Outpatient Encounter Medications as of 07/07/2022  Medication Sig   aspirin EC 81 MG tablet Take 1 tablet (81 mg total) by mouth 2 (two) times daily. (Patient taking differently: Take 81 mg by mouth daily.)   atorvastatin (LIPITOR) 40 MG tablet TAKE 1 TABLET DAILY   clindamycin (CLEOCIN-T) 1 % lotion Apply topically 2 (two) times daily.   Coenzyme Q10 (COQ10) 200 MG CAPS Take 200 mg by mouth at bedtime.  fluocinonide ointment (LIDEX) 0.05 % Apply 1 application topically daily as needed (for bites).   fluticasone (FLONASE) 50 MCG/ACT nasal spray Place 2 sprays into both nostrils daily as needed for allergies.    loratadine (CLARITIN) 10 MG tablet Take 10 mg by mouth daily as needed for allergies.    Magnesium 250 MG TABS    meloxicam (MOBIC) 15 MG tablet TAKE 1 TABLET BY MOUTH ONCE DAILY   metFORMIN (GLUCOPHAGE) 500 MG tablet Take 1 tablet (500 mg total) by mouth daily with breakfast.   pantoprazole (PROTONIX) 40 MG tablet Take 1 tablet (40 mg total) by mouth daily.   tamsulosin (FLOMAX) 0.4 MG CAPS capsule Take 1 capsule (0.4 mg total) by mouth daily.   tirzepatide (MOUNJARO) 7.5 MG/0.5ML Pen Inject 7.5 mg into the skin once a week.   valsartan (DIOVAN) 80 MG tablet Take 1 tablet (80 mg total) by mouth daily.   Vitamin D, Ergocalciferol, (DRISDOL) 1.25 MG (50000 UNIT) CAPS capsule TAKE ONE CAPSULE BY MOUTH EVERY 7 DAYS   Zinc 50 MG CAPS Take by mouth daily.   No facility-administered encounter medications on file as  of 07/07/2022.  :  Review of Systems:  Out of a complete 14 point review of systems, all are reviewed and negative with the exception of these symptoms as listed below:  Review of Systems  Neurological:        Pt here for tremor and CPAP f/u Pt states CPAP going great Pt states tremors same Pt states at times when he has tremors his teeth chatter  Pt states when he picks up an item increased tremors   ESS:13     Objective:  Neurological Exam  Physical Exam Physical Examination:   Vitals:   07/07/22 0945  BP: 125/70  Pulse: (!) 55    General Examination: The patient is a very pleasant 77 y.o. male in no acute distress. He appears well-developed and well-nourished and well groomed.   HEENT: Normocephalic, atraumatic, pupils are equal, round and reactive to light. Tracking well-preserved, corrective eyeglasses in place.  Hearing grossly intact with hearing aid in place.  He has no voice tremor. Face is symmetric with normal facial animation no facial masking. Speech is clear with no dysarthria noted. There is an intermittent mild lower jaw tremor. Neck is supple with full range of passive and active motion. There are no carotid bruits on auscultation. Oropharynx exam reveals: mild mouth dryness, good dental hygiene and moderate airway crowding. Tongue protrudes centrally and palate elevates symmetrically.    Chest: Clear to auscultation without wheezing, rhonchi or crackles noted.   Heart: S1+S2+0, regular and normal without murmurs, rubs or gallops noted.  Bradycardia noted.   Abdomen: Soft, non-tender and non-distended.   Extremities: There is no obvious edema.      Skin: Warm and dry without trophic changes noted.   Musculoskeletal: exam reveals no obvious joint deformities, he is status post bilateral hip replacements as well as shoulder surgeries, some right knee discomfort.    Neurologically:  Mental status: The patient is awake, alert and oriented in all 4 spheres. His  immediate and remote memory, attention, language skills and fund of knowledge are appropriate. There is no evidence of aphasia, agnosia, apraxia or anomia. Speech is clear with normal prosody and enunciation. Thought process is linear. Mood is normal and affect is normal.  Cranial nerves II - XII are as described above under HEENT exam.  No drift.    (On 04/24/2020: On Archimedes spiral  drawing he has insecurity with the left hand, no significant trembling noted with the right or left hand, handwriting is legible, not particularly tremulous and not micrographic.)     He has a slight postural tremor and slight action tremor in the upper extremities, no intention tremor, no resting tremor.     Fine motor skills and coordination: intact grossly in the upper and lower extremities.   Cerebellar testing: No dysmetria or intention tremor on finger to nose testing. There is no truncal or gait ataxia.  Sensory exam: intact to light touch in the upper and lower extremities.  Gait, station and balance: He stands without difficulty, he has a slightly asymmetrical hip height.  He walks without shuffling, has preserved arm swing.  Increase in lumbar kyphosis noted, stable.   Assessment and Plan:    In summary, Eric [name] is a 77 year old male with an underlying medical history of diverticulosis, hypertension, hyperlipidemia, insulin resistance, vitamin D deficiency, edema, osteoarthritis, with status post hip replacement surgeries, seasonal allergies, OSA, chronic back pain, and obesity, who presents for follow-up consultation of his OSA on AutoPap therapy and history of essential tremor affecting his jaw and his hands. Findings are overall mild and fairly stable at this time.  We had previously discussed symptomatic treatment.  Due to his bradycardia, beta-blocker is not a good choice.  Due to balance issues and fear of fall risk, I would like to avoid Mysoline.  He is compliant with his AutoPap machine and  is commended for his treatment adherence.  He is working on weight loss and has lost a considerable amount of weight over the past couple years.  He had some questions about Parkinson's disease and parkinsonism.  He was reassured, we went over typical presentation of Parkinson's disease and he has no evidence of parkinsonism on exam.  He established treatment with a new AutoPap machine in mid February 2023. He is advised at this juncture to follow-up routinely in 1 year in this clinic for checkup of sleep apnea and essential tremor.  We talked about typical tremor triggers.  He is encouraged to continue to maintain a healthy lifestyle.  He has had interim leg cramps and magnesium has helped.  He has used a topical magnesium cream as well.  He can see one of our nurse practitioners next time.  I answered all his questions today and he was in agreement.   I spent 40 minutes in total face-to-face time and in reviewing records during pre-charting, more than 50% of which was spent in counseling and coordination of care, reviewing test results, reviewing medications and treatment regimen and/or in discussing or reviewing the diagnosis of OSA, ET, the prognosis and treatment options. Pertinent laboratory and imaging test results that were available during this visit with the patient were reviewed by me and considered in my medical decision making (see chart for details).

## 2022-07-07 NOTE — Patient Instructions (Addendum)
Please continue using your autoPAP regularly. While your insurance requires that you use PAP at least 4 hours each night on 70% of the nights, I recommend, that you not skip any nights and use it throughout the night if you can. Getting used to PAP and staying with the treatment long term does take time and patience and discipline. Untreated obstructive sleep apnea when it is moderate to severe can have an adverse impact on cardiovascular health and raise her risk for heart disease, arrhythmias, hypertension, congestive heart failure, stroke and diabetes. Untreated obstructive sleep apnea causes sleep disruption, nonrestorative sleep, and sleep deprivation. This can have an impact on your day to day functioning and cause daytime sleepiness and impairment of cognitive function, memory loss, mood disturbance, and problems focussing. Using PAP regularly can improve these symptoms. We can continue to monitor your tremor.  You have no evidence of parkinsonism thankfully.  We can see you in 1 year, you can see one of our nurse practitioners as you are stable.

## 2022-07-15 ENCOUNTER — Other Ambulatory Visit (INDEPENDENT_AMBULATORY_CARE_PROVIDER_SITE_OTHER): Payer: Self-pay | Admitting: Family Medicine

## 2022-07-15 DIAGNOSIS — Z794 Long term (current) use of insulin: Secondary | ICD-10-CM

## 2022-07-17 ENCOUNTER — Encounter (INDEPENDENT_AMBULATORY_CARE_PROVIDER_SITE_OTHER): Payer: Self-pay | Admitting: Family Medicine

## 2022-07-17 DIAGNOSIS — E1169 Type 2 diabetes mellitus with other specified complication: Secondary | ICD-10-CM

## 2022-07-20 DIAGNOSIS — L814 Other melanin hyperpigmentation: Secondary | ICD-10-CM | POA: Diagnosis not present

## 2022-07-20 DIAGNOSIS — L603 Nail dystrophy: Secondary | ICD-10-CM | POA: Diagnosis not present

## 2022-07-20 DIAGNOSIS — D692 Other nonthrombocytopenic purpura: Secondary | ICD-10-CM | POA: Diagnosis not present

## 2022-07-20 DIAGNOSIS — D1801 Hemangioma of skin and subcutaneous tissue: Secondary | ICD-10-CM | POA: Diagnosis not present

## 2022-07-20 DIAGNOSIS — Z85828 Personal history of other malignant neoplasm of skin: Secondary | ICD-10-CM | POA: Diagnosis not present

## 2022-07-20 DIAGNOSIS — L218 Other seborrheic dermatitis: Secondary | ICD-10-CM | POA: Diagnosis not present

## 2022-07-21 ENCOUNTER — Encounter (INDEPENDENT_AMBULATORY_CARE_PROVIDER_SITE_OTHER): Payer: Self-pay | Admitting: Family Medicine

## 2022-07-21 ENCOUNTER — Ambulatory Visit (INDEPENDENT_AMBULATORY_CARE_PROVIDER_SITE_OTHER): Payer: Medicare Other | Admitting: Family Medicine

## 2022-07-21 ENCOUNTER — Other Ambulatory Visit (INDEPENDENT_AMBULATORY_CARE_PROVIDER_SITE_OTHER): Payer: Self-pay | Admitting: Family Medicine

## 2022-07-21 VITALS — BP 106/73 | HR 55 | Temp 98.2°F | Ht 69.0 in | Wt 219.0 lb

## 2022-07-21 DIAGNOSIS — E669 Obesity, unspecified: Secondary | ICD-10-CM | POA: Diagnosis not present

## 2022-07-21 DIAGNOSIS — E1169 Type 2 diabetes mellitus with other specified complication: Secondary | ICD-10-CM

## 2022-07-21 DIAGNOSIS — Z794 Long term (current) use of insulin: Secondary | ICD-10-CM

## 2022-07-21 DIAGNOSIS — Z6832 Body mass index (BMI) 32.0-32.9, adult: Secondary | ICD-10-CM | POA: Diagnosis not present

## 2022-07-21 DIAGNOSIS — Z7984 Long term (current) use of oral hypoglycemic drugs: Secondary | ICD-10-CM

## 2022-07-21 MED ORDER — METFORMIN HCL 500 MG PO TABS: 500.0000 mg | ORAL_TABLET | Freq: Every day | ORAL | 0 refills | Status: DC

## 2022-07-23 ENCOUNTER — Other Ambulatory Visit: Payer: Self-pay | Admitting: Family Medicine

## 2022-07-23 DIAGNOSIS — N400 Enlarged prostate without lower urinary tract symptoms: Secondary | ICD-10-CM

## 2022-07-26 NOTE — Progress Notes (Signed)
Chief Complaint:   OBESITY Eric [name] is here to discuss his progress with his obesity treatment plan along with follow-up of his obesity related diagnoses. Eric [name] is on keeping a food journal and adhering to recommended goals of 1300-1600 calories and 100+ grams of protein and states he is following his eating plan approximately 75% of the time. Eric [name] states he is doing strengthening for 20-30 minutes 7 times per week.  Today's visit was #: 60 Starting weight: 288 lbs Starting date: 12/14/2017 Today's weight: 219 lbs Today's date: 07/21/2022 Total lbs lost to date: 69 Total lbs lost since last in-office visit: 4  Interim History: Eric [name] continues to do well with weight loss. He has been trying to journal more often and this has helped with his weight loss. His hunger is mostly controlled and he is exercising regularly.   Subjective:   1. Type 2 diabetes mellitus with other specified complication, with long-term current use of insulin (HCC) Eric [name] is on metformin and Mounjaro, and he is doing better with decreasing simple carbohydrates overall. His polyphagia is improved, and he denies nausea or vomiting.   Assessment/Plan:   1. Type 2 diabetes mellitus with other specified complication, with long-term current use of insulin (HCC) Gains will continue his medications, and we will refill metformin for 1 month.   - metFORMIN (GLUCOPHAGE) 500 MG tablet; Take 1 tablet (500 mg total) by mouth daily with breakfast.  Dispense: 30 tablet; Refill: 0  2. Obesity, Beginning BMI 43.79  3. Obesity, Current BMI 32.4 Eric [name] is currently in the action stage of change. As such, his goal is to continue with weight loss efforts. He has agreed to the Category 3 Plan or keeping a food journal and adhering to recommended goals of 1300-1600 calories and 100+ grams of protein daily.   Exercise goals: As is.   Behavioral modification strategies: increasing lean protein intake.  Eric [name] has agreed  to follow-up with our clinic in 4 weeks. He was informed of the importance of frequent follow-up visits to maximize his success with intensive lifestyle modifications for his multiple health conditions.   Objective:   Blood pressure 106/73, pulse (!) 55, temperature 98.2 F (36.8 C), height 5\' 9"  (1.753 m), weight 219 lb (99.3 kg), SpO2 96 %. Body mass index is 32.34 kg/m.  Lab Results  Component Value Date   CREATININE 0.96 03/05/2022   BUN 20 03/05/2022   NA 141 03/05/2022   K 4.2 03/05/2022   CL 103 03/05/2022   CO2 30 03/05/2022   Lab Results  Component Value Date   ALT 18 03/05/2022   AST 12 03/05/2022   ALKPHOS 55 03/05/2022   BILITOT 0.7 03/05/2022   Lab Results  Component Value Date   HGBA1C 5.3 03/05/2022   HGBA1C 5.7 09/03/2021   HGBA1C 5.7 03/05/2021   HGBA1C 5.6 09/01/2020   HGBA1C 5.4 03/03/2020   Lab Results  Component Value Date   INSULIN 9.0 10/22/2019   INSULIN 21.3 12/14/2017   Lab Results  Component Value Date   TSH 0.58 03/05/2022   TSH 0.64 03/05/2022   Lab Results  Component Value Date   CHOL 127 03/05/2022   HDL 52.10 03/05/2022   LDLCALC 63 03/05/2022   TRIG 64.0 03/05/2022   CHOLHDL 2 03/05/2022   Lab Results  Component Value Date   VD25OH 70.37 03/05/2022   VD25OH 66.83 09/03/2021   VD25OH 43.17 03/05/2021   Lab Results  Component Value Date   WBC 5.6 03/05/2022  HGB 15.6 03/05/2022   HCT 46.0 03/05/2022   MCV 91.7 03/05/2022   PLT 186.0 03/05/2022   No results found for: "IRON", "TIBC", "FERRITIN"  Attestation Statements:   Reviewed by clinician on day of visit: allergies, medications, problem list, medical history, surgical history, family history, social history, and previous encounter notes.  Time spent on visit including pre-visit chart review and post-visit care and charting was 36 minutes.   I, Burt Knack, am acting as transcriptionist for Quillian Quince, MD.  I have reviewed the above documentation for  accuracy and completeness, and I agree with the above. -  Quillian Quince, MD

## 2022-07-29 ENCOUNTER — Telehealth: Payer: Self-pay | Admitting: *Deleted

## 2022-07-29 NOTE — Telephone Encounter (Signed)
Surgical clearance form as requested by orthopedics was signed, we see him in this clinic for tremors and sleep apnea.

## 2022-07-29 NOTE — Telephone Encounter (Signed)
Fax confirmation received form and last ofv note.  [phone] [facility]. Copy to medical results.

## 2022-07-30 ENCOUNTER — Telehealth: Payer: Self-pay

## 2022-07-30 NOTE — Telephone Encounter (Signed)
Received pre-op assessment from [facility] regarding patient's TKA. Pt needs surgical clearance appointment please.

## 2022-08-02 MED ORDER — TIRZEPATIDE 7.5 MG/0.5ML ~~LOC~~ SOAJ: 7.5000 mg | SUBCUTANEOUS | 0 refills | Status: DC

## 2022-08-02 NOTE — Telephone Encounter (Signed)
Please rf x 90 days

## 2022-08-03 ENCOUNTER — Ambulatory Visit: Payer: TRICARE For Life (TFL) | Admitting: Family Medicine

## 2022-08-03 ENCOUNTER — Encounter: Payer: Self-pay | Admitting: Family Medicine

## 2022-08-03 VITALS — BP 110/70 | HR 54 | Temp 98.9°F | Resp 18 | Ht 69.0 in | Wt 231.0 lb

## 2022-08-03 DIAGNOSIS — Z9660 Presence of unspecified orthopedic joint implant: Secondary | ICD-10-CM | POA: Diagnosis not present

## 2022-08-03 DIAGNOSIS — Z01818 Encounter for other preprocedural examination: Secondary | ICD-10-CM

## 2022-08-03 MED ORDER — AMOXICILLIN 500 MG PO CAPS
ORAL_CAPSULE | ORAL | 3 refills | Status: DC
Start: 2022-08-03 — End: 2023-07-26

## 2022-08-03 NOTE — Progress Notes (Signed)
Subjective:   By signing my name below, I, Eric [name], attest that this documentation has been prepared under the direction and in the presence of Donato Schultz, DO. 08/03/2022   Patient ID: Eric [name], male    DOB: 1945-10-16, 77 y.o.   MRN: [id number]  Chief Complaint  Patient presents with   Pre-op Exam    RTKA    HPI Patient is in today for a pre-op visit.   He is having a right knee replacement procedure in July 2024. He is having constant knee pain at this time. He has tried collagen injections in his knees and found no relief. He's had no issues with anesthesia in the past.  He is requesting a refill for amoxicillin for his upcomming dentist appointment.  He has run out of mounjaro and his pharmacy has run out of stock.    Past Medical History:  Diagnosis Date   Alcohol abuse    Allergy    Back pain    Cataract    forming   Colon polyps    Diverticulosis of colon    DJD (degenerative joint disease)    Dysrhythmia    Palpations from caffiene- reports he was having PACs due to slim fast wth caffeine - had full cardiac w/u and was told to cut back on caffeine;  now drink decaf bevrerages and no rpeort of recurrence    Enlarged prostate    GERD (gastroesophageal reflux disease)    Hearing loss    Hip osteoarthritis    Left   History of hay fever    Hx of cardiovascular stress test    Lexiscan Myoview (04/2013):  No ischemia, EF 61%; low risk   Hyperlipemia    Hypertension    Insulin resistance    om metfromin    Internal hemorrhoids without mention of complication    Leg edema    Neuroma of foot    left   Obesity    Osteoarthrosis, unspecified whether generalized or localized, unspecified site    Palpitations    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   Primary localized osteoarthrosis of right shoulder 05/25/2016   Recent weight loss    75lbs   Seasonal allergies    Sleep apnea    CPAP   Thyroid nodule    calcified thyroid  nodules- bx negative    Tremor of both hands     Past Surgical History:  Procedure Laterality Date   COLONOSCOPY     POLYPECTOMY     SHOULDER SURGERY  2009   right   TONSILLECTOMY     TOTAL HIP ARTHROPLASTY Left 03/28/2017   Procedure: TOTAL HIP ARTHROPLASTY ANTERIOR APPROACH;  Surgeon: Gean Birchwood, MD;  Location: [facility];  Service: Orthopedics;  Laterality: Left;   TOTAL HIP ARTHROPLASTY Right 09/25/2018   Procedure: Right Anterior Hip Arthroplasty;  Surgeon: Gean Birchwood, MD;  Location: [facility];  Service: Orthopedics;  Laterality: Right;   TOTAL SHOULDER ARTHROPLASTY Left 10/14/2015   Procedure: LEFT TOTAL SHOULDER ARTHROPLASTY;  Surgeon: Teryl Lucy, MD;  Location: [facility];  Service: Orthopedics;  Laterality: Left;   TOTAL SHOULDER ARTHROPLASTY Right 05/25/2016   Procedure: TOTAL SHOULDER ARTHROPLASTY;  Surgeon: Teryl Lucy, MD;  Location: [facility];  Service: Orthopedics;  Laterality: Right;    Family History  Problem Relation Age of Onset   Arthritis Mother    Cancer Mother 30       breast   Hyperlipidemia Mother    Colon  polyps Mother 32   Hypertension Mother    Heart disease Mother    Depression Mother    Anxiety disorder Mother    Obesity Mother    Breast cancer Mother    Heart disease Father        CAD--passed away Sep 06, 2011 age 91   Hyperlipidemia Father    Hypertension Father    Obesity Father    Diabetes Father    Depression Father    Sleep apnea Father    Cancer Sister 42       breast   Breast cancer Sister    Sleep apnea Sister    Cancer Brother 14       prostate   Prostate cancer Brother    Atrial fibrillation Brother    Tremor Paternal Grandfather    Breast cancer Other    Prostate cancer Other    Colon cancer Neg Hx    Esophageal cancer Neg Hx    Rectal cancer Neg Hx    Stomach cancer Neg Hx     Social History   Socioeconomic History   Marital status: Married    Spouse name: Bao Brucker    Number of children: 3   Years of education: Not on file    Highest education level: Bachelor's degree (e.g., BA, AB, BS)  Occupational History   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    Social Determinants of Health   Financial Resource Strain: Low Risk  (08/02/2022)   Overall Financial Resource Strain (CARDIA)    Difficulty of Paying Living Expenses: Not hard at all  Food Insecurity: No Food Insecurity (08/02/2022)   Hunger Vital Sign    Worried About Running Out of Food in the Last Year: Never true    Ran Out of Food in the Last Year: Never true  Transportation Needs: No Transportation Needs (08/02/2022)   PRAPARE - Administrator, Civil Service (Medical): No    Lack of Transportation (Non-Medical): No  Physical Activity: Inactive (08/02/2022)   Exercise Vital Sign    Days of Exercise per Week: 0 days    Minutes of Exercise per Session: 60 min  Stress: No Stress Concern Present (08/02/2022)   [name] of Occupational Health - Occupational Stress Questionnaire    Feeling of Stress : Not at all  Social Connections: Moderately Integrated (08/02/2022)   Social Connection and Isolation Panel [NHANES]    Frequency of Communication with Friends and Family: Twice a week    Frequency of Social Gatherings with Friends and Family: Three times a week    Attends Religious Services: 1 to 4 times per year    Active Member of Clubs or Organizations: No    Attends Banker Meetings: Never    Marital Status: Married  Catering manager Violence: Not At Risk (08/05/2021)    Humiliation, Afraid, Rape, and Kick questionnaire    Fear of Current or Ex-Partner: No  Emotionally Abused: No    Physically Abused: No    Sexually Abused: No    Outpatient Medications Prior to Visit  Medication Sig Dispense Refill   aspirin EC 81 MG tablet Take 1 tablet (81 mg total) by mouth 2 (two) times daily. (Patient taking differently: Take 81 mg by mouth daily.) 60 tablet 0   atorvastatin (LIPITOR) 40 MG tablet TAKE 1 TABLET DAILY 90 tablet 1   clindamycin (CLEOCIN-T) 1 % lotion Apply topically 2 (two) times daily. 60 mL 2   Coenzyme Q10 (COQ10) 200 MG CAPS Take 200 mg by mouth at bedtime.     fluocinonide ointment (LIDEX) 0.05 % Apply 1 application topically daily as needed (for bites). 30 g 3   fluticasone (FLONASE) 50 MCG/ACT nasal spray Place 2 sprays into both nostrils daily as needed for allergies.      loratadine (CLARITIN) 10 MG tablet Take 10 mg by mouth daily as needed for allergies.      Magnesium 250 MG TABS      meloxicam (MOBIC) 15 MG tablet TAKE 1 TABLET BY MOUTH ONCE DAILY 90 tablet 1   metFORMIN (GLUCOPHAGE) 500 MG tablet Take 1 tablet (500 mg total) by mouth daily with breakfast. 30 tablet 0   pantoprazole (PROTONIX) 40 MG tablet Take 1 tablet (40 mg total) by mouth daily. 90 tablet 1   tamsulosin (FLOMAX) 0.4 MG CAPS capsule Take 1 capsule (0.4 mg total) by mouth daily after supper. 90 capsule 1   tirzepatide (MOUNJARO) 7.5 MG/0.5ML Pen Inject 7.5 mg into the skin once a week. 6 mL 0   valsartan (DIOVAN) 80 MG tablet Take 1 tablet (80 mg total) by mouth daily. 90 tablet 1   Vitamin D, Ergocalciferol, (DRISDOL) 1.25 MG (50000 UNIT) CAPS capsule TAKE ONE CAPSULE BY MOUTH EVERY 7 DAYS 12 capsule 0   Zinc 50 MG CAPS Take by mouth daily.     No facility-administered medications prior to visit.    Allergies  Allergen Reactions   Lisinopril Cough    Review of Systems  Constitutional:  Negative for fever and malaise/fatigue.  HENT:  Negative for congestion.    Eyes:  Negative for blurred vision.  Respiratory:  Negative for cough and shortness of breath.   Cardiovascular:  Negative for chest pain, palpitations and leg swelling.  Gastrointestinal:  Negative for vomiting.  Musculoskeletal:  Negative for back pain.       (+)right knee pain  Skin:  Negative for rash.  Neurological:  Negative for loss of consciousness and headaches.       Objective:    Physical Exam Vitals and nursing note reviewed.  Constitutional:      General: He is not in acute distress.    Appearance: Normal appearance. He is not ill-appearing.  HENT:     Head: Normocephalic and atraumatic.     Right Ear: External ear normal.     Left Ear: External ear normal.  Eyes:     Extraocular Movements: Extraocular movements intact.     Pupils: Pupils are equal, round, and reactive to light.  Cardiovascular:     Rate and Rhythm: Normal rate and regular rhythm.     Heart sounds: Normal heart sounds. No murmur heard.    No gallop.  Pulmonary:     Effort: Pulmonary effort is normal. No respiratory distress.     Breath sounds: Normal breath sounds. No wheezing or rales.  Musculoskeletal:        General: Tenderness present.  Right knee: Decreased range of motion. Tenderness present.     Left knee: Tenderness present.  Skin:    General: Skin is warm and dry.  Neurological:     Mental Status: He is alert and oriented to person, place, and time.  Psychiatric:        Judgment: Judgment normal.     BP 110/70 (BP Location: Left Arm, Patient Position: Sitting, Cuff Size: Normal)   Pulse (!) 54   Temp 98.9 F (37.2 C) (Oral)   Resp 18   Ht 5\' 9"  (1.753 m)   Wt 231 lb (104.8 kg)   SpO2 96%   BMI 34.11 kg/m  Wt Readings from Last 3 Encounters:  08/03/22 231 lb (104.8 kg)  07/21/22 219 lb (99.3 kg)  07/07/22 231 lb 9.6 oz (105.1 kg)       Assessment & Plan:  Preop examination Assessment & Plan: Pt cleared for surgery  Pre op labs to be done at ortho office  EKG  sinus brady  Orders: -     EKG 12-Lead  S/P joint replacement -     Amoxicillin; 4 po prior to procedure  Dispense: 30 capsule; Refill: 3    I, Donato Schultz, DO, personally preformed the services described in this documentation.  All medical record entries made by the scribe were at my direction and in my presence.  I have reviewed the chart and discharge instructions (if applicable) and agree that the record reflects my personal performance and is accurate and complete. 08/03/2022   I,Eric [name],acting as a scribe for Donato Schultz, DO.,have documented all relevant documentation on the behalf of Donato Schultz, DO,as directed by  Donato Schultz, DO while in the presence of Donato Schultz, DO.   Donato Schultz, DO

## 2022-08-03 NOTE — Assessment & Plan Note (Signed)
Pt cleared for surgery  Pre op labs to be done at ortho office  EKG sinus brady

## 2022-08-04 ENCOUNTER — Encounter: Payer: Self-pay | Admitting: Family Medicine

## 2022-08-05 ENCOUNTER — Other Ambulatory Visit: Payer: Self-pay

## 2022-08-05 DIAGNOSIS — B351 Tinea unguium: Secondary | ICD-10-CM

## 2022-08-11 ENCOUNTER — Other Ambulatory Visit: Payer: Self-pay | Admitting: Orthopedic Surgery

## 2022-08-17 ENCOUNTER — Other Ambulatory Visit (INDEPENDENT_AMBULATORY_CARE_PROVIDER_SITE_OTHER): Payer: Self-pay | Admitting: Family Medicine

## 2022-08-17 DIAGNOSIS — E1169 Type 2 diabetes mellitus with other specified complication: Secondary | ICD-10-CM

## 2022-08-19 ENCOUNTER — Encounter (INDEPENDENT_AMBULATORY_CARE_PROVIDER_SITE_OTHER): Payer: Self-pay | Admitting: Family Medicine

## 2022-08-19 ENCOUNTER — Other Ambulatory Visit (HOSPITAL_COMMUNITY): Payer: Self-pay

## 2022-08-19 ENCOUNTER — Ambulatory Visit (INDEPENDENT_AMBULATORY_CARE_PROVIDER_SITE_OTHER): Payer: Medicare Other | Admitting: Family Medicine

## 2022-08-19 VITALS — BP 119/75 | HR 48 | Temp 98.5°F | Ht 69.0 in | Wt 220.0 lb

## 2022-08-19 DIAGNOSIS — Z6832 Body mass index (BMI) 32.0-32.9, adult: Secondary | ICD-10-CM | POA: Diagnosis not present

## 2022-08-19 DIAGNOSIS — E7849 Other hyperlipidemia: Secondary | ICD-10-CM

## 2022-08-19 DIAGNOSIS — Z6830 Body mass index (BMI) 30.0-30.9, adult: Secondary | ICD-10-CM | POA: Insufficient documentation

## 2022-08-19 DIAGNOSIS — E1169 Type 2 diabetes mellitus with other specified complication: Secondary | ICD-10-CM | POA: Diagnosis not present

## 2022-08-19 DIAGNOSIS — Z794 Long term (current) use of insulin: Secondary | ICD-10-CM

## 2022-08-19 DIAGNOSIS — E669 Obesity, unspecified: Secondary | ICD-10-CM

## 2022-08-19 DIAGNOSIS — Z7985 Long-term (current) use of injectable non-insulin antidiabetic drugs: Secondary | ICD-10-CM | POA: Diagnosis not present

## 2022-08-19 DIAGNOSIS — Z6831 Body mass index (BMI) 31.0-31.9, adult: Secondary | ICD-10-CM | POA: Insufficient documentation

## 2022-08-19 MED ORDER — METFORMIN HCL 500 MG PO TABS
500.0000 mg | ORAL_TABLET | Freq: Every day | ORAL | 0 refills | Status: DC
Start: 2022-08-19 — End: 2022-08-23

## 2022-08-19 MED ORDER — TIRZEPATIDE 7.5 MG/0.5ML ~~LOC~~ SOAJ
7.5000 mg | SUBCUTANEOUS | 0 refills | Status: DC
Start: 2022-08-19 — End: 2022-09-16
  Filled 2022-08-19: qty 2, 28d supply, fill #0

## 2022-08-23 ENCOUNTER — Encounter (INDEPENDENT_AMBULATORY_CARE_PROVIDER_SITE_OTHER): Payer: Self-pay | Admitting: Family Medicine

## 2022-08-23 DIAGNOSIS — Z794 Long term (current) use of insulin: Secondary | ICD-10-CM

## 2022-08-23 MED ORDER — METFORMIN HCL 500 MG PO TABS
500.0000 mg | ORAL_TABLET | Freq: Every day | ORAL | 0 refills | Status: DC
Start: 2022-08-23 — End: 2022-09-16

## 2022-08-23 NOTE — Progress Notes (Unsigned)
Chief Complaint:   OBESITY Eric [name] is here to discuss his progress with his obesity treatment plan along with follow-up of his obesity related diagnoses. Eric [name] is on the Category 3 Plan or keeping a food journal and adhering to recommended goals of 1300-1600 calories and 100+ grams of protein and states he is following his eating plan approximately 80% of the time. Eric [name] states he is walking for 25-30 minutes 2-3 times per week.  Today's visit was #: 61 Starting weight: 288 lbs Starting date: 12/14/2017 Today's weight: 220 lbs Today's date: 08/19/2022 Total lbs lost to date: 68 Total lbs lost since last in-office visit: 0  Interim History: Patient has been busy with increased social situations and traveling.  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  Subjective:   1. Type 2 diabetes mellitus with other specified complication, with long-term current use of insulin (HCC) Patient is on Mounjaro but he has missed a dose due to medication shortages.  2. Other hyperlipidemia Patient is on Lipitor, and he is doing well with his diet and with no side effects noted.  Assessment/Plan:   1. Type 2 diabetes mellitus with other specified complication, with long-term current use of insulin (HCC) Patient will continue his medications, and we will refill Mounjaro 7.5 mg and metformin 500 mg once daily for 1 month.  - tirzepatide (MOUNJARO) 7.5 MG/0.5ML Pen; Inject 7.5 mg into the skin once a week.  Dispense: 2 mL; Refill: 0  2. Other hyperlipidemia Patient will continue Lipitor, and we will recheck labs in 1 to 2 months.  3. BMI 32.0-32.9,adult  4. Obesity,with starting BMI 43.79 Eric [name] is currently in the action stage of change. As such, his goal is to continue with weight loss efforts. He has agreed to the Category 3 Plan.   Exercise goals: As is.   Behavioral  modification strategies: meal planning and cooking strategies.  Eric [name] has agreed to follow-up with our clinic in 4 weeks. He was informed of the importance of frequent follow-up visits to maximize his success with intensive lifestyle modifications for his multiple health conditions.   Objective:   Blood pressure 119/75, pulse (!) 48, temperature 98.5 F (36.9 C), height 5\' 9"  (1.753 m), weight 220 lb (99.8 kg), SpO2 99 %. Body mass index is 32.49 kg/m.  Lab Results  Component Value Date   CREATININE 0.96 03/05/2022   BUN 20 03/05/2022   NA 141 03/05/2022   K 4.2 03/05/2022   CL 103 03/05/2022   CO2 30 03/05/2022   Lab Results  Component Value Date   ALT 18 03/05/2022   AST 12 03/05/2022   ALKPHOS 55 03/05/2022   BILITOT 0.7 03/05/2022   Lab Results  Component Value Date   HGBA1C 5.3 03/05/2022   HGBA1C 5.7 09/03/2021   HGBA1C 5.7 03/05/2021   HGBA1C 5.6 09/01/2020   HGBA1C 5.4 03/03/2020   Lab Results  Component Value Date   INSULIN 9.0 10/22/2019   INSULIN 21.3 12/14/2017   Lab Results  Component Value Date   TSH 0.58 03/05/2022   TSH 0.64 03/05/2022   Lab Results  Component Value Date   CHOL 127 03/05/2022   HDL 52.10 03/05/2022   LDLCALC 63 03/05/2022   TRIG 64.0 03/05/2022   CHOLHDL 2 03/05/2022   Lab Results  Component Value Date   VD25OH 70.37 03/05/2022   VD25OH 66.83 09/03/2021  VD25OH 43.17 03/05/2021   Lab Results  Component Value Date   WBC 5.6 03/05/2022   HGB 15.6 03/05/2022   HCT 46.0 03/05/2022   MCV 91.7 03/05/2022   PLT 186.0 03/05/2022   No results found for: "IRON", "TIBC", "FERRITIN"  Attestation Statements:   Reviewed by clinician on day of visit: allergies, medications, problem list, medical history, surgical history, family history, social history, and previous encounter notes.   I, Burt Knack, am acting as transcriptionist for Quillian Quince, MD.  I have reviewed the above documentation for accuracy and  completeness, and I agree with the above. -  Quillian Quince, MD

## 2022-08-24 ENCOUNTER — Ambulatory Visit (INDEPENDENT_AMBULATORY_CARE_PROVIDER_SITE_OTHER): Payer: Medicare Other | Admitting: Podiatry

## 2022-08-24 DIAGNOSIS — B351 Tinea unguium: Secondary | ICD-10-CM

## 2022-08-24 DIAGNOSIS — E1169 Type 2 diabetes mellitus with other specified complication: Secondary | ICD-10-CM

## 2022-08-24 DIAGNOSIS — L603 Nail dystrophy: Secondary | ICD-10-CM

## 2022-08-24 DIAGNOSIS — Z794 Long term (current) use of insulin: Secondary | ICD-10-CM | POA: Diagnosis not present

## 2022-08-24 NOTE — Progress Notes (Signed)
Subjective:  Patient ID: Eric [name], male    DOB: [birth year],  MRN: [id number]  Chief Complaint  Patient presents with   Nail Problem    toenail fungus;wants to discuss treatment options. Dermatologist obtained a nail sample and biopsy came back positive for nail fungus. Denies any pain to the nails.     77 y.o. male presents with concern for fungal nail infection to the second toe on the left foot.  Previous has had a nail biopsy done by his dermatologist but said it was a fungal infection has not been on any medications.  Past Medical History:  Diagnosis Date   Alcohol abuse    Allergy    Back pain    Cataract    forming   Colon polyps    Diverticulosis of colon    DJD (degenerative joint disease)    Dysrhythmia    Palpations from caffiene- reports he was having PACs due to slim fast wth caffeine - had full cardiac w/u and was told to cut back on caffeine;  now drink decaf bevrerages and no rpeort of recurrence    Enlarged prostate    GERD (gastroesophageal reflux disease)    Hearing loss    Hip osteoarthritis    Left   History of hay fever    Hx of cardiovascular stress test    Lexiscan Myoview (04/2013):  No ischemia, EF 61%; low risk   Hyperlipemia    Hypertension    Insulin resistance    om metfromin    Internal hemorrhoids without mention of complication    Leg edema    Neuroma of foot    left   Obesity    Osteoarthrosis, unspecified whether generalized or localized, unspecified site    Palpitations    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   Primary localized osteoarthrosis of right shoulder 05/25/2016   Recent weight loss    75lbs   Seasonal allergies    Sleep apnea    CPAP   Thyroid nodule    calcified thyroid nodules- bx negative    Tremor of both hands     Allergies  Allergen Reactions   Lisinopril Cough    ROS: Negative except as per HPI above  Objective:  General: AAO x3, NAD  Dermatological: Double nail at the distal  aspect of the left second toenail which is thickened and discolored.  Underlying nail upon debridement of the overlying nail appears healthy.  No pain of the nail  Vascular:  Dorsalis Pedis artery and Posterior Tibial artery pedal pulses are 2/4 bilateral.  Capillary fill time < 3 sec to all digits.   Neruologic: Grossly intact via light touch bilateral. Protective threshold intact to all sites bilateral.   Musculoskeletal: No gross boney pedal deformities bilateral. No pain, crepitus, or limitation noted with foot and ankle range of motion bilateral. Muscular strength 5/5 in all groups tested bilateral.  Gait: Unassisted, Nonantalgic.    Assessment:   1. Nail dystrophy   2. Onychomycosis   3. Type 2 diabetes mellitus with other specified complication, with long-term current use of insulin (HCC)      Plan:  Patient was evaluated and treated and all questions answered.  #Onychomycosis of the left second toenail -Discussed with patient that he does have a double nail growing on the left second toe. -I debrided the single nail with removal of the distal outer layer of nail which was dystrophic and fungal.  Upon debridement the underlying nail appears much healthier  this was smoothed with a Dremel -Do not recommend any medications at this time as the fungal nail infection is very mild and may be resolved with this treatment -Follow-up as needed         Corinna Gab, DPM [facility] / [facility]

## 2022-08-26 ENCOUNTER — Other Ambulatory Visit: Payer: Self-pay | Admitting: Family Medicine

## 2022-08-26 DIAGNOSIS — E785 Hyperlipidemia, unspecified: Secondary | ICD-10-CM

## 2022-09-01 ENCOUNTER — Ambulatory Visit: Payer: Medicare Other | Admitting: *Deleted

## 2022-09-01 DIAGNOSIS — Z0000 Encounter for general adult medical examination without abnormal findings: Secondary | ICD-10-CM | POA: Diagnosis not present

## 2022-09-01 NOTE — Progress Notes (Signed)
Subjective:  Pt completed ADLs, Fall risk, and SDOH during e-check in on 08/25/22.  Answers verified with pt.    Eric [name] is a 77 y.o. male who presents for Medicare Annual/Subsequent preventive examination.  I connected with  Eric [name] on 09/01/22 by a audio enabled telemedicine application and verified that I am speaking with the correct person using two identifiers.  Patient Location: Home  Provider Location: Office/Clinic  I discussed the limitations of evaluation and management by telemedicine. The patient expressed understanding and agreed to proceed.   Review of Systems     Cardiac Risk Factors include: advanced age (>72men, >74 women);male gender;diabetes mellitus;dyslipidemia;hypertension     Objective:    Today's Vitals   There is no height or weight on file to calculate BMI.     09/01/2022   10:21 AM 08/05/2021    2:24 PM 09/25/2018    8:12 AM 09/21/2018    8:29 AM 03/28/2017    2:36 PM 03/16/2017    8:24 AM 05/14/2016   12:29 PM  Advanced Directives  Does Patient Have a Medical Advance Directive? Yes Yes Yes Yes Yes Yes Yes  Type of Estate agent of [place];Living will Healthcare Power of [place];Out of facility DNR (pink MOST or yellow form);Living will Living will Living will Healthcare Power of [place];Living will Healthcare Power of [place];Living will Healthcare Power of [place];Living will  Does patient want to make changes to medical advance directive?   No - Patient declined No - Patient declined No - Patient declined No - Patient declined   Copy of Healthcare Power of Attorney in Chart? No - copy requested No - copy requested   No - copy requested No - copy requested     Current Medications (verified) Outpatient Encounter Medications as of 09/01/2022  Medication Sig   amoxicillin (AMOXIL) 500 MG capsule 4 po prior to procedure   aspirin EC 81 MG tablet Take 1 tablet (81 mg total) by mouth 2 (two) times daily.  (Patient taking differently: Take 81 mg by mouth daily.)   atorvastatin (LIPITOR) 40 MG tablet TAKE ONE TABLET BY MOUTH ONCE DAILY   clindamycin (CLEOCIN-T) 1 % lotion Apply topically 2 (two) times daily.   Coenzyme Q10 (COQ10) 200 MG CAPS Take 200 mg by mouth at bedtime.   fluocinonide ointment (LIDEX) 0.05 % Apply 1 application topically daily as needed (for bites).   fluticasone (FLONASE) 50 MCG/ACT nasal spray Place 2 sprays into both nostrils daily as needed for allergies.    loratadine (CLARITIN) 10 MG tablet Take 10 mg by mouth daily as needed for allergies.    Magnesium 250 MG TABS    meloxicam (MOBIC) 15 MG tablet TAKE 1 TABLET BY MOUTH ONCE DAILY   metFORMIN (GLUCOPHAGE) 500 MG tablet Take 1 tablet (500 mg total) by mouth daily with breakfast.   pantoprazole (PROTONIX) 40 MG tablet Take 1 tablet (40 mg total) by mouth daily.   tamsulosin (FLOMAX) 0.4 MG CAPS capsule Take 1 capsule (0.4 mg total) by mouth daily after supper.   tirzepatide (MOUNJARO) 7.5 MG/0.5ML Pen Inject 7.5 mg into the skin once a week.   tirzepatide (MOUNJARO) 7.5 MG/0.5ML Pen Inject 7.5 mg into the skin once a week.   valsartan (DIOVAN) 80 MG tablet Take 1 tablet (80 mg total) by mouth daily.   Vitamin D, Ergocalciferol, (DRISDOL) 1.25 MG (50000 UNIT) CAPS capsule TAKE ONE CAPSULE BY MOUTH EVERY 7 DAYS   Zinc 50 MG CAPS Take  by mouth daily.   No facility-administered encounter medications on file as of 09/01/2022.    Allergies (verified) Lisinopril   History: Past Medical History:  Diagnosis Date   Alcohol abuse    Allergy    Back pain    Cataract    forming   Colon polyps    Diverticulosis of colon    DJD (degenerative joint disease)    Dysrhythmia    Palpations from caffiene- reports he was having PACs due to slim fast wth caffeine - had full cardiac w/u and was told to cut back on caffeine;  now drink decaf bevrerages and no rpeort of recurrence    Enlarged prostate    GERD (gastroesophageal  reflux disease)    Hearing loss    Hip osteoarthritis    Left   History of hay fever    Hx of cardiovascular stress test    Lexiscan Myoview (04/2013):  No ischemia, EF 61%; low risk   Hyperlipemia    Hypertension    Insulin resistance    om metfromin    Internal hemorrhoids without mention of complication    Leg edema    Neuroma of foot    left   Obesity    Osteoarthrosis, unspecified whether generalized or localized, unspecified site    Palpitations    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   Primary localized osteoarthrosis of right shoulder 05/25/2016   Recent weight loss    75lbs   Seasonal allergies    Sleep apnea    CPAP   Thyroid nodule    calcified thyroid nodules- bx negative    Tremor of both hands    Past Surgical History:  Procedure Laterality Date   COLONOSCOPY     JOINT REPLACEMENT  Sep 17, 2016   POLYPECTOMY     SHOULDER SURGERY  2007/09/18   right   TONSILLECTOMY     TOTAL HIP ARTHROPLASTY Left 03/28/2017   Procedure: TOTAL HIP ARTHROPLASTY ANTERIOR APPROACH;  Surgeon: Gean Birchwood, MD;  Location: [facility] OR;  Service: Orthopedics;  Laterality: Left;   TOTAL HIP ARTHROPLASTY Right 09/25/2018   Procedure: Right Anterior Hip Arthroplasty;  Surgeon: Gean Birchwood, MD;  Location: [facility];  Service: Orthopedics;  Laterality: Right;   TOTAL SHOULDER ARTHROPLASTY Left 10/14/2015   Procedure: LEFT TOTAL SHOULDER ARTHROPLASTY;  Surgeon: Teryl Lucy, MD;  Location: [facility] OR;  Service: Orthopedics;  Laterality: Left;   TOTAL SHOULDER ARTHROPLASTY Right 05/25/2016   Procedure: TOTAL SHOULDER ARTHROPLASTY;  Surgeon: Teryl Lucy, MD;  Location: [facility] OR;  Service: Orthopedics;  Laterality: Right;   Family History  Problem Relation Age of Onset   Arthritis Mother    Cancer Mother 66       breast   Hyperlipidemia Mother    Colon polyps Mother 41   Hypertension Mother    Heart disease Mother    Depression Mother    Anxiety disorder Mother    Obesity Mother    Breast  cancer Mother    Heart disease Father        CAD--passed away 09-18-11 age 72   Hyperlipidemia Father    Hypertension Father    Obesity Father    Diabetes Father    Depression Father    Sleep apnea Father    Cancer Sister 81       breast   Breast cancer Sister    Sleep apnea Sister    Cancer Brother 57       prostate   Prostate cancer Brother  Atrial fibrillation Brother    Tremor Paternal Grandfather    Breast cancer Other    Prostate cancer Other    Colon cancer Neg Hx    Esophageal cancer Neg Hx    Rectal cancer Neg Hx    Stomach cancer Neg Hx    Social History   Socioeconomic History   Marital status: Married    Spouse name: Joseff Luckman    Number of children: 3   Years of education: Not on file   Highest education level: Bachelor's degree (e.g., BA, AB, BS)  Occupational History   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    Social Determinants of Health   Financial Resource Strain: Low Risk  (08/25/2022)   Overall Financial Resource Strain (CARDIA)    Difficulty of Paying Living Expenses: Not hard at all  Food Insecurity: No Food Insecurity (08/25/2022)   Hunger Vital Sign    Worried About Running Out of Food in the Last Year: Never true    Ran Out of Food in the Last Year: Never true  Transportation Needs: No Transportation Needs (08/25/2022)   PRAPARE - Administrator, Civil Service (Medical): No    Lack of Transportation (Non-Medical): No  Physical Activity: Insufficiently Active (08/25/2022)    Exercise Vital Sign    Days of Exercise per Week: 3 days    Minutes of Exercise per Session: 30 min  Stress: No Stress Concern Present (08/25/2022)   [name] of Occupational Health - Occupational Stress Questionnaire    Feeling of Stress : Not at all  Social Connections: Socially Integrated (08/25/2022)   Social Connection and Isolation Panel [NHANES]    Frequency of Communication with Friends and Family: Once a week    Frequency of Social Gatherings with Friends and Family: Twice a week    Attends Religious Services: 1 to 4 times per year    Active Member of [employer] or Organizations: Yes    Attends Engineer, structural: More than 4 times per year    Marital Status: Married    Tobacco Counseling Counseling given: Not Answered   Clinical Intake:  Pre-visit preparation completed: Yes  Pain : No/denies pain  Nutritional Risks: None Diabetes: Yes CBG done?: No Did pt. bring in CBG monitor from home?: No  How often do you need to have someone help you when you read instructions, pamphlets, or other written materials from your doctor or pharmacy?: 1 - Never   Activities of Daily Living    08/25/2022   10:02 AM  In your present state of health, do you have any difficulty performing the following activities:  Hearing? 1  Comment wears hearing aids  Vision? 0  Difficulty concentrating or making decisions? 0  Walking or climbing stairs? 1  Dressing or bathing? 0  Doing errands, shopping? 0  Preparing Food and eating ? N  Using the Toilet? N  In the past six months, have  you accidently leaked urine? Y  Do you have problems with loss of bowel control? N  Managing your Medications? N  Managing your Finances? N  Housekeeping or managing your Housekeeping? N    Patient Care Team: Zola Button, Grayling Congress, DO as PCP - General Pearletha Forge Azucena Fallen, MD as Consulting Physician (Sports Medicine) Teryl Lucy, MD as Consulting Physician (Orthopedic Surgery) Dermatology,  [facility], MD as Attending Physician (Neurology) Crista Elliot, MD as Consulting Physician (Urology) Donalee Citrin, MD as Consulting Physician (Neurosurgery) Begovich, Stacey Drain, DO (Sports Medicine) Isaac Laud, MD as Referring Physician (Physical Medicine and Rehabilitation)  Indicate any recent Medical Services you may have received from other than [facility] providers in the past year (date may be approximate).     Assessment:   This is a routine wellness examination for [place].  Hearing/Vision screen No results found.  Dietary issues and exercise activities discussed: Current Exercise Habits: Home exercise routine, Type of exercise: walking, Time (Minutes): 30, Frequency (Times/Week): 3, Weekly Exercise (Minutes/Week): 90, Intensity: Mild, Exercise limited by: orthopedic condition(s)   Goals Addressed   None    Depression Screen    09/01/2022   10:23 AM 03/05/2022    8:42 AM 08/05/2021    2:25 PM 09/01/2020    9:29 AM 03/03/2020    8:31 AM 03/02/2019    8:25 AM 12/14/2017    8:22 AM  PHQ 2/9 Scores  PHQ - 2 Score 0 0 0 0 0 0 6  PHQ- 9 Score  0     19    Fall Risk    08/25/2022   10:02 AM 03/05/2022    8:42 AM 08/05/2021    2:25 PM 09/01/2020    9:29 AM 03/03/2020    8:31 AM  Fall Risk   Falls in the past year? 0 0 1 0 0  Number falls in past yr: 0 0 0 0 0  Injury with Fall? 0 0 0 0 0  Risk for fall due to : No Fall Risks  History of fall(s)    Follow up Falls evaluation completed  Falls evaluation completed Falls evaluation completed     FALL RISK PREVENTION PERTAINING TO THE HOME:  Any stairs in or around the home? Yes  If so, are there any without handrails? No  Home free of loose throw rugs in walkways, pet beds, electrical cords, etc? Yes  Adequate lighting in your home to reduce risk of falls? Yes   ASSISTIVE DEVICES UTILIZED TO PREVENT FALLS:  Life alert? No  Use of a cane, walker or w/c? No  Grab bars in the bathroom? Yes   Shower chair or bench in shower?  Built in seat Elevated toilet seat or a handicapped toilet? Yes   TIMED UP AND GO:  Was the test performed?  No, audio visit .    Cognitive Function:    04/23/2016    4:00 PM  MMSE - Mini Mental State Exam  Orientation to time 5  Orientation to Place 5  Registration 3  Attention/ Calculation 5  Recall 3  Language- name 2 objects 2  Language- repeat 1  Language- follow 3 step command 3  Language- read & follow direction 1  Write a sentence 1  Copy design 1  Total score 30        09/01/2022   10:26 AM 08/05/2021    2:30 PM  6CIT Screen  What Year? 0 points 0 points  What month?  0 points 0 points  What time? 0 points 0 points  Count back from 20 0 points 0 points  Months in reverse 0 points 0 points  Repeat phrase 4 points 2 points  Total Score 4 points 2 points    Immunizations Immunization History  Administered Date(s) Administered   Influenza Split 12/24/2011, 12/11/2019   Influenza Whole 01/01/2009   Influenza, High Dose Seasonal PF 11/26/2013, 01/01/2015, 11/18/2016, 11/28/2018, 12/09/2021   Influenza,inj,Quad PF,6+ Mos 01/15/2013   Influenza-Unspecified 12/15/2015, 12/16/2017, 11/28/2018, 12/18/2020   PFIZER(Purple Top)SARS-COV-2 Vaccination 04/12/2019, 05/03/2019, 12/21/2019   Pfizer Covid-19 Vaccine Bivalent Booster 1yrs & up 03/05/2021   Pneumococcal Conjugate-13 02/11/2014   Pneumococcal Polysaccharide-23 12/24/2011   Td 05/01/2008   Tdap 01/01/2015   Zoster Recombinat (Shingrix) 11/24/2016, 03/10/2017   Zoster, Live 10/08/2010    TDAP status: Up to date  Flu Vaccine status: Up to date  Pneumococcal vaccine status: Up to date  Covid-19 vaccine status: Information provided on how to obtain vaccines.   Qualifies for Shingles Vaccine? Yes   Zostavax completed Yes   Shingrix Completed?: Yes  Screening Tests Health Maintenance  Topic Date Due   FOOT EXAM  Never done   OPHTHALMOLOGY EXAM  Never done    Diabetic kidney evaluation - Urine ACR  09/01/2021   COVID-19 Vaccine (5 - 2023-24 season) 11/20/2021   HEMOGLOBIN A1C  09/04/2022   INFLUENZA VACCINE  10/21/2022   Diabetic kidney evaluation - eGFR measurement  03/06/2023   Colonoscopy  04/04/2023   Medicare Annual Wellness (AWV)  09/01/2023   DTaP/Tdap/Td (3 - Td or Tdap) 12/31/2024   Pneumonia Vaccine 30+ Years old  Completed   Hepatitis C Screening  Completed   Zoster Vaccines- Shingrix  Completed   HPV VACCINES  Aged Out    Health Maintenance  Health Maintenance Due  Topic Date Due   FOOT EXAM  Never done   OPHTHALMOLOGY EXAM  Never done   Diabetic kidney evaluation - Urine ACR  09/01/2021   COVID-19 Vaccine (5 - 2023-24 season) 11/20/2021    Colorectal cancer screening: Type of screening: Colonoscopy. Completed 04/03/18. Repeat every 5 years  Lung Cancer Screening: (Low Dose CT Chest recommended if Age 66-80 years, 30 pack-year currently smoking OR have quit w/in 15years.) does not qualify.    Additional Screening:  Hepatitis C Screening: does qualify; Completed 01/29/09  Vision Screening: Recommended annual ophthalmology exams for early detection of glaucoma and other disorders of the eye. Is the patient up to date with their annual eye exam?  Yes  Who is the provider or what is the name of the office in which the patient attends annual eye exams? Dr. Jimmy Footman If pt is not established with a provider, would they like to be referred to a provider to establish care? No .   Dental Screening: Recommended annual dental exams for proper oral hygiene  Community Resource Referral / Chronic Care Management: CRR required this visit?  No   CCM required this visit?  No      Plan:     I have personally reviewed and noted the following in the patient's chart:   Medical and social history Use of alcohol, tobacco or illicit drugs  Current medications and supplements including opioid prescriptions. Patient is not currently  taking opioid prescriptions. Functional ability and status Nutritional status Physical activity Advanced directives List of other physicians Hospitalizations, surgeries, and ER visits in previous 12 months Vitals Screenings to include cognitive, depression, and falls Referrals and appointments  In addition, I have reviewed and discussed with patient certain preventive protocols, quality metrics, and best practice recommendations. A written personalized care plan for preventive services as well as general preventive health recommendations were provided to patient.   Due to this being a telephonic visit, the after visit summary with patients personalized plan was offered to patient via mail or my-chart. Patient would like to access on my-chart.  Donne Anon, [place]   09/01/2022   Nurse Notes: None

## 2022-09-01 NOTE — Patient Instructions (Signed)
Eric [name] , Thank you for taking time to come for your Medicare Wellness Visit. I appreciate your ongoing commitment to your health goals. Please review the following plan we discussed and let me know if I can assist you in the future.      This is a list of the screening recommended for you and due dates:  Health Maintenance  Topic Date Due   Complete foot exam   Never done   Eye exam for diabetics  Never done   Yearly kidney health urinalysis for diabetes  09/01/2021   COVID-19 Vaccine (5 - 2023-24 season) 11/20/2021   Hemoglobin A1C  09/04/2022   Flu Shot  10/21/2022   Yearly kidney function blood test for diabetes  03/06/2023   Colon Cancer Screening  04/04/2023   Medicare Annual Wellness Visit  09/01/2023   DTaP/Tdap/Td vaccine (3 - Td or Tdap) 12/31/2024   Pneumonia Vaccine  Completed   Hepatitis C Screening  Completed   Zoster (Shingles) Vaccine  Completed   HPV Vaccine  Aged Out    Next appointment: Follow up in one year for your annual wellness visit.   Preventive Care 42 Years and Older, Male Preventive care refers to lifestyle choices and visits with your health care provider that can promote health and wellness. What does preventive care include? A yearly physical exam. This is also called an annual well check. Dental exams once or twice a year. Routine eye exams. Ask your health care provider how often you should have your eyes checked. Personal lifestyle choices, including: Daily care of your teeth and gums. Regular physical activity. Eating a healthy diet. Avoiding tobacco and drug use. Limiting alcohol use. Practicing safe sex. Taking low doses of aspirin every day. Taking vitamin and mineral supplements as recommended by your health care provider. What happens during an annual well check? The services and screenings done by your health care provider during your annual well check will depend on your age, overall health, lifestyle risk factors, and family  history of disease. Counseling  Your health care provider may ask you questions about your: Alcohol use. Tobacco use. Drug use. Emotional well-being. Home and relationship well-being. Sexual activity. Eating habits. History of falls. Memory and ability to understand (cognition). Work and work Astronomer. Screening  You may have the following tests or measurements: Height, weight, and BMI. Blood pressure. Lipid and cholesterol levels. These may be checked every 5 years, or more frequently if you are over 36 years old. Skin check. Lung cancer screening. You may have this screening every year starting at age 65 if you have a 30-pack-year history of smoking and currently smoke or have quit within the past 15 years. Fecal occult blood test (FOBT) of the stool. You may have this test every year starting at age 32. Flexible sigmoidoscopy or colonoscopy. You may have a sigmoidoscopy every 5 years or a colonoscopy every 10 years starting at age 65. Prostate cancer screening. Recommendations will vary depending on your family history and other risks. Hepatitis C blood test. Hepatitis B blood test. Sexually transmitted disease (STD) testing. Diabetes screening. This is done by checking your blood sugar (glucose) after you have not eaten for a while (fasting). You may have this done every 1-3 years. Abdominal aortic aneurysm (AAA) screening. You may need this if you are a current or former smoker. Osteoporosis. You may be screened starting at age 34 if you are at high risk. Talk with your health care provider about your test results,  treatment options, and if necessary, the need for more tests. Vaccines  Your health care provider may recommend certain vaccines, such as: Influenza vaccine. This is recommended every year. Tetanus, diphtheria, and acellular pertussis (Tdap, Td) vaccine. You may need a Td booster every 10 years. Zoster vaccine. You may need this after age 18. Pneumococcal  13-valent conjugate (PCV13) vaccine. One dose is recommended after age 62. Pneumococcal polysaccharide (PPSV23) vaccine. One dose is recommended after age 76. Talk to your health care provider about which screenings and vaccines you need and how often you need them. This information is not intended to replace advice given to you by your health care provider. Make sure you discuss any questions you have with your health care provider. Document Released: 04/04/2015 Document Revised: 11/26/2015 Document Reviewed: 01/07/2015 Elsevier Interactive Patient Education  2017 [place] Prevention in the Home Falls can cause injuries. They can happen to people of all ages. There are many things you can do to make your home safe and to help prevent falls. What can I do on the outside of my home? Regularly fix the edges of walkways and driveways and fix any cracks. Remove anything that might make you trip as you walk through a door, such as a raised step or threshold. Trim any bushes or trees on the path to your home. Use bright outdoor lighting. Clear any walking paths of anything that might make someone trip, such as rocks or tools. Regularly check to see if handrails are loose or broken. Make sure that both sides of any steps have handrails. Any raised decks and porches should have guardrails on the edges. Have any leaves, snow, or ice cleared regularly. Use sand or salt on walking paths during winter. Clean up any spills in your garage right away. This includes oil or grease spills. What can I do in the bathroom? Use night lights. Install grab bars by the toilet and in the tub and shower. Do not use towel bars as grab bars. Use non-skid mats or decals in the tub or shower. If you need to sit down in the shower, use a plastic, non-slip stool. Keep the floor dry. Clean up any water that spills on the floor as soon as it happens. Remove soap buildup in the tub or shower regularly. Attach  bath mats securely with double-sided non-slip rug tape. Do not have throw rugs and other things on the floor that can make you trip. What can I do in the bedroom? Use night lights. Make sure that you have a light by your bed that is easy to reach. Do not use any sheets or blankets that are too big for your bed. They should not hang down onto the floor. Have a firm chair that has side arms. You can use this for support while you get dressed. Do not have throw rugs and other things on the floor that can make you trip. What can I do in the kitchen? Clean up any spills right away. Avoid walking on wet floors. Keep items that you use a lot in easy-to-reach places. If you need to reach something above you, use a strong step stool that has a grab bar. Keep electrical cords out of the way. Do not use floor polish or wax that makes floors slippery. If you must use wax, use non-skid floor wax. Do not have throw rugs and other things on the floor that can make you trip. What can I do with my stairs? Do  not leave any items on the stairs. Make sure that there are handrails on both sides of the stairs and use them. Fix handrails that are broken or loose. Make sure that handrails are as long as the stairways. Check any carpeting to make sure that it is firmly attached to the stairs. Fix any carpet that is loose or worn. Avoid having throw rugs at the top or bottom of the stairs. If you do have throw rugs, attach them to the floor with carpet tape. Make sure that you have a light switch at the top of the stairs and the bottom of the stairs. If you do not have them, ask someone to add them for you. What else can I do to help prevent falls? Wear shoes that: Do not have high heels. Have rubber bottoms. Are comfortable and fit you well. Are closed at the toe. Do not wear sandals. If you use a stepladder: Make sure that it is fully opened. Do not climb a closed stepladder. Make sure that both sides of the  stepladder are locked into place. Ask someone to hold it for you, if possible. Clearly mark and make sure that you can see: Any grab bars or handrails. First and last steps. Where the edge of each step is. Use tools that help you move around (mobility aids) if they are needed. These include: Canes. Walkers. Scooters. Crutches. Turn on the lights when you go into a dark area. Replace any light bulbs as soon as they burn out. Set up your furniture so you have a clear path. Avoid moving your furniture around. If any of your floors are uneven, fix them. If there are any pets around you, be aware of where they are. Review your medicines with your doctor. Some medicines can make you feel dizzy. This can increase your chance of falling. Ask your doctor what other things that you can do to help prevent falls. This information is not intended to replace advice given to you by your health care provider. Make sure you discuss any questions you have with your health care provider. Document Released: 01/02/2009 Document Revised: 08/14/2015 Document Reviewed: 04/12/2014 Elsevier Interactive Patient Education  2017 [employer].

## 2022-09-06 ENCOUNTER — Ambulatory Visit: Payer: TRICARE For Life (TFL) | Admitting: Family Medicine

## 2022-09-09 ENCOUNTER — Other Ambulatory Visit (HOSPITAL_COMMUNITY): Payer: Self-pay

## 2022-09-09 ENCOUNTER — Ambulatory Visit: Payer: Medicare Other | Admitting: Family Medicine

## 2022-09-09 ENCOUNTER — Encounter: Payer: Self-pay | Admitting: Family Medicine

## 2022-09-09 ENCOUNTER — Other Ambulatory Visit (INDEPENDENT_AMBULATORY_CARE_PROVIDER_SITE_OTHER): Payer: Self-pay | Admitting: Family Medicine

## 2022-09-09 ENCOUNTER — Other Ambulatory Visit (HOSPITAL_BASED_OUTPATIENT_CLINIC_OR_DEPARTMENT_OTHER): Payer: Self-pay

## 2022-09-09 VITALS — BP 110/70 | HR 54 | Temp 98.4°F | Resp 18 | Ht 69.0 in | Wt 223.8 lb

## 2022-09-09 DIAGNOSIS — Z7985 Long-term (current) use of injectable non-insulin antidiabetic drugs: Secondary | ICD-10-CM

## 2022-09-09 DIAGNOSIS — E559 Vitamin D deficiency, unspecified: Secondary | ICD-10-CM

## 2022-09-09 DIAGNOSIS — E785 Hyperlipidemia, unspecified: Secondary | ICD-10-CM | POA: Diagnosis not present

## 2022-09-09 DIAGNOSIS — E7849 Other hyperlipidemia: Secondary | ICD-10-CM | POA: Diagnosis not present

## 2022-09-09 DIAGNOSIS — I10 Essential (primary) hypertension: Secondary | ICD-10-CM

## 2022-09-09 DIAGNOSIS — K219 Gastro-esophageal reflux disease without esophagitis: Secondary | ICD-10-CM

## 2022-09-09 DIAGNOSIS — Z794 Long term (current) use of insulin: Secondary | ICD-10-CM

## 2022-09-09 DIAGNOSIS — E1169 Type 2 diabetes mellitus with other specified complication: Secondary | ICD-10-CM

## 2022-09-09 DIAGNOSIS — G4733 Obstructive sleep apnea (adult) (pediatric): Secondary | ICD-10-CM | POA: Diagnosis not present

## 2022-09-09 DIAGNOSIS — M159 Polyosteoarthritis, unspecified: Secondary | ICD-10-CM

## 2022-09-09 DIAGNOSIS — Z6832 Body mass index (BMI) 32.0-32.9, adult: Secondary | ICD-10-CM | POA: Diagnosis not present

## 2022-09-09 LAB — CBC WITH DIFFERENTIAL/PLATELET
Basophils Absolute: 0 10*3/uL (ref 0.0–0.1)
Basophils Relative: 0.5 % (ref 0.0–3.0)
Eosinophils Absolute: 0.1 10*3/uL (ref 0.0–0.7)
Eosinophils Relative: 0.9 % (ref 0.0–5.0)
HCT: 47.1 % (ref 39.0–52.0)
Hemoglobin: 15.3 g/dL (ref 13.0–17.0)
Lymphocytes Relative: 20.9 % (ref 12.0–46.0)
Lymphs Abs: 1.4 10*3/uL (ref 0.7–4.0)
MCHC: 32.5 g/dL (ref 30.0–36.0)
MCV: 94.3 fl (ref 78.0–100.0)
Monocytes Absolute: 0.7 10*3/uL (ref 0.1–1.0)
Monocytes Relative: 9.9 % (ref 3.0–12.0)
Neutro Abs: 4.5 10*3/uL (ref 1.4–7.7)
Neutrophils Relative %: 67.8 % (ref 43.0–77.0)
Platelets: 199 10*3/uL (ref 150.0–400.0)
RBC: 4.99 Mil/uL (ref 4.22–5.81)
RDW: 14.1 % (ref 11.5–15.5)
WBC: 6.6 10*3/uL (ref 4.0–10.5)

## 2022-09-09 LAB — LIPID PANEL
Cholesterol: 102 mg/dL (ref 0–200)
HDL: 40.4 mg/dL (ref >39.00)
LDL Cholesterol: 48 mg/dL (ref 0–99)
NonHDL: 62.04
Total CHOL/HDL Ratio: 3
Triglycerides: 68 mg/dL (ref 0.0–149.0)
VLDL: 13.6 mg/dL (ref 0.0–40.0)

## 2022-09-09 LAB — VITAMIN D 25 HYDROXY (VIT D DEFICIENCY, FRACTURES): VITD: 77.43 ng/mL (ref 30.00–100.00)

## 2022-09-09 LAB — HEMOGLOBIN A1C: Hgb A1c MFr Bld: 5.4 % (ref 4.6–6.5)

## 2022-09-09 LAB — COMPREHENSIVE METABOLIC PANEL
ALT: 16 U/L (ref 0–53)
AST: 13 U/L (ref 0–37)
Albumin: 4.1 g/dL (ref 3.5–5.2)
Alkaline Phosphatase: 52 U/L (ref 39–117)
BUN: 17 mg/dL (ref 6–23)
CO2: 29 mEq/L (ref 19–32)
Calcium: 8.9 mg/dL (ref 8.4–10.5)
Chloride: 105 mEq/L (ref 96–112)
Creatinine, Ser: 0.94 mg/dL (ref 0.40–1.50)
GFR: 78.82 mL/min (ref >60.00)
Glucose, Bld: 90 mg/dL (ref 70–99)
Potassium: 4.5 mEq/L (ref 3.5–5.1)
Sodium: 141 mEq/L (ref 135–145)
Total Bilirubin: 0.8 mg/dL (ref 0.2–1.2)
Total Protein: 6.3 g/dL (ref 6.0–8.3)

## 2022-09-09 LAB — TSH: TSH: 0.62 u[IU]/mL (ref 0.35–5.50)

## 2022-09-09 MED ORDER — VALSARTAN 80 MG PO TABS
80.0000 mg | ORAL_TABLET | Freq: Every day | ORAL | 1 refills | Status: DC
Start: 2022-09-09 — End: 2022-09-20
  Filled 2022-09-09: qty 90, 90d supply, fill #0

## 2022-09-09 MED ORDER — MELOXICAM 15 MG PO TABS
15.0000 mg | ORAL_TABLET | Freq: Every day | ORAL | 1 refills | Status: DC
Start: 2022-09-09 — End: 2022-09-22
  Filled 2022-09-09: qty 90, 90d supply, fill #0

## 2022-09-09 MED ORDER — PANTOPRAZOLE SODIUM 40 MG PO TBEC
40.0000 mg | DELAYED_RELEASE_TABLET | Freq: Every day | ORAL | 1 refills | Status: DC
Start: 2022-09-09 — End: 2022-09-16
  Filled 2022-09-09: qty 90, 90d supply, fill #0

## 2022-09-09 MED ORDER — TIRZEPATIDE 7.5 MG/0.5ML ~~LOC~~ SOAJ
7.5000 mg | SUBCUTANEOUS | 1 refills | Status: DC
Start: 2022-09-09 — End: 2023-02-01
  Filled 2022-09-09 – 2022-09-16 (×2): qty 2, 28d supply, fill #0
  Filled 2022-10-11: qty 2, 28d supply, fill #1
  Filled 2022-11-18: qty 2, 28d supply, fill #2
  Filled 2022-12-13: qty 2, 28d supply, fill #3
  Filled 2023-01-10 – 2023-01-11 (×2): qty 2, 28d supply, fill #4

## 2022-09-09 NOTE — Assessment & Plan Note (Signed)
CON'T WITH [facility]ww

## 2022-09-09 NOTE — Assessment & Plan Note (Signed)
hgba1c to be done today,  minimize simple carbs. Increase exercise as tolerated. Continue current meds

## 2022-09-09 NOTE — Assessment & Plan Note (Signed)
Tolerating statin, encouraged heart healthy diet, avoid trans fats, minimize simple carbs and saturated fats. Increase exercise as tolerated 

## 2022-09-09 NOTE — Progress Notes (Signed)
Established Patient Office Visit  Subjective   Patient ID: Eric [name], male    DOB: 1946/01/21  Age: 77 y.o. MRN: [id number]  Chief Complaint  Patient presents with   Hypertension   Follow-up    HPI Discussed the use of AI scribe software for clinical note transcription with the patient, who gave verbal consent to proceed.  History of Present Illness   The patient, with a history of diabetes and recent weight loss, presents for a routine check-up. They are scheduled for knee surgery on the 17th and have been advised to stop their Mounjaro medication 10 days prior to the procedure. The patient expresses concern about the availability of their Mounjaro medication, as their usual pharmacy is currently out of stock. They also mention a recent basal cell removal, for which they are now on a six-month follow-up schedule. The patient reports a significant weight loss of seventy one pounds over the past four years, which they attribute to a combination of journaling, walking, and medication. They also mention a history of diabetes, which is being managed with metformin and vitamin D.      Patient Active Problem List   Diagnosis Date Noted   BMI 32.0-32.9,adult 08/19/2022   BMI 34.0-34.9,adult 05/19/2022   DJD (degenerative joint disease) 05/19/2022   BMI 33.0-33.9,adult 04/21/2022   Obesity, Beginning BMI 43.79 04/21/2022   Other constipation 12/21/2021   Diabetes mellitus (HCC) 09/28/2021   Bronchitis 02/03/2021   Depression 04/02/2020   Viral upper respiratory tract infection 08/31/2019   Folliculitis 08/31/2019   Preventative health care 03/02/2019   Morbid obesity (HCC) 03/02/2019   Hypotension 10/11/2018   Status post total hip replacement, right 09/25/2018   Osteoarthritis of right hip 09/21/2018   Prediabetes 08/29/2018   Vitamin D deficiency 08/29/2018   Primary osteoarthritis of right hip 08/29/2018   Thyroid nodule 05/12/2017   Primary osteoarthritis of left hip  03/28/2017   Status post total replacement of left hip 03/28/2017   Osteoarthritis of left hip 03/25/2017   Essential hypertension 11/18/2016   Hyperlipidemia LDL goal <100 11/18/2016   Need for prophylactic vaccination and inoculation against influenza 11/18/2016   Primary localized osteoarthrosis of right shoulder 05/25/2016   History of total replacement of right shoulder joint 05/25/2016   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   S/P shoulder replacement 10/14/2015   Low back pain 09/03/2015   Left foot pain 04/04/2014   Left shoulder pain 02/20/2014   Palpitations 12/25/2012   Left hip pain 12/25/2012   Class 3 severe obesity with serious comorbidity and body mass index (BMI) of 40.0 to 44.9 in adult [facility]) 12/25/2012   Acute bronchitis 12/25/2012   Urgency of urination 12/24/2011   Urinary frequency 12/24/2011   Preop examination 10/08/2010   Hyperlipidemia 10/08/2010   DECREASED HEARING 09/04/2009   INTERNAL HEMORRHOIDS WITHOUT MENTION COMP 09/04/2009   ALLERGIC RHINITIS, SEASONAL 09/04/2009   GERD 09/04/2009   DIVERTICULOSIS, COLON 09/04/2009   OSTEOARTHRITIS 09/04/2009   Sleep apnea 09/04/2009   Past Medical History:  Diagnosis Date   Alcohol abuse    Allergy    Back pain    Cataract    forming   Colon polyps    Diverticulosis of colon    DJD (degenerative joint disease)    Dysrhythmia    Palpations from caffiene- reports he was having PACs due to slim fast wth caffeine - had full cardiac w/u and was told to cut back on caffeine;  now drink decaf bevrerages  and no rpeort of recurrence    Enlarged prostate    GERD (gastroesophageal reflux disease)    Hearing loss    Hip osteoarthritis    Left   History of hay fever    Hx of cardiovascular stress test    Lexiscan Myoview (04/2013):  No ischemia, EF 61%; low risk   Hyperlipemia    Hypertension    Insulin resistance    om metfromin    Internal hemorrhoids without mention of complication    Leg  edema    Neuroma of foot    left   Obesity    Osteoarthrosis, unspecified whether generalized or localized, unspecified site    Palpitations    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   Primary localized osteoarthrosis of right shoulder 05/25/2016   Recent weight loss    75lbs   Seasonal allergies    Sleep apnea    CPAP   Thyroid nodule    calcified thyroid nodules- bx negative    Tremor of both hands    Past Surgical History:  Procedure Laterality Date   COLONOSCOPY     JOINT REPLACEMENT  2018   POLYPECTOMY     SHOULDER SURGERY  2009   right   TONSILLECTOMY     TOTAL HIP ARTHROPLASTY Left 03/28/2017   Procedure: TOTAL HIP ARTHROPLASTY ANTERIOR APPROACH;  Surgeon: Gean Birchwood, MD;  Location: [facility] OR;  Service: Orthopedics;  Laterality: Left;   TOTAL HIP ARTHROPLASTY Right 09/25/2018   Procedure: Right Anterior Hip Arthroplasty;  Surgeon: Gean Birchwood, MD;  Location: [facility];  Service: Orthopedics;  Laterality: Right;   TOTAL SHOULDER ARTHROPLASTY Left 10/14/2015   Procedure: LEFT TOTAL SHOULDER ARTHROPLASTY;  Surgeon: Teryl Lucy, MD;  Location: [facility] OR;  Service: Orthopedics;  Laterality: Left;   TOTAL SHOULDER ARTHROPLASTY Right 05/25/2016   Procedure: TOTAL SHOULDER ARTHROPLASTY;  Surgeon: Teryl Lucy, MD;  Location: [facility] OR;  Service: Orthopedics;  Laterality: Right;   Social History   Tobacco Use   Smoking status: Never   Smokeless tobacco: Never  Vaping Use   Vaping Use: Never used  Substance Use Topics   Alcohol use: Not Currently    Alcohol/week: 1.0 standard drink of alcohol    Types: 1 Glasses of wine per week    Comment: rare----12 a year   Drug use: No   Social History   Socioeconomic History   Marital status: Married    Spouse name: Kamaury Gigliotti    Number of children: 3   Years of education: Not on file   Highest education level: Bachelor's degree (e.g., BA, AB, BS)  Occupational History   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    Social Determinants of Health   Financial Resource Strain: Low Risk  (08/25/2022)   Overall Financial Resource Strain (CARDIA)    Difficulty of Paying Living Expenses: Not hard at all  Food Insecurity: No Food Insecurity (08/25/2022)   Hunger Vital  Sign    Worried About Programme researcher, broadcasting/film/video in the Last Year: Never true    Ran Out of Food in the Last Year: Never true  Transportation Needs: No Transportation Needs (08/25/2022)   PRAPARE - Administrator, Civil Service (Medical): No    Lack of Transportation (Non-Medical): No  Physical Activity: Insufficiently Active (08/25/2022)   Exercise Vital Sign    Days of Exercise per Week: 3 days    Minutes of Exercise per Session: 30 min  Stress: No Stress Concern Present (08/25/2022)   [name] of Occupational Health - Occupational Stress Questionnaire    Feeling of Stress : Not at all  Social Connections: Socially Integrated (08/25/2022)   Social Connection and Isolation Panel [NHANES]    Frequency of Communication with Friends and Family: Once a week    Frequency of Social Gatherings with Friends and Family: Twice a week    Attends Religious Services: 1 to 4 times per year    Active Member of [employer] or Organizations: Yes    Attends Engineer, structural: More than 4 times per year    Marital Status: Married  Catering manager Violence: Not At Risk (08/05/2021)   Humiliation, Afraid, Rape, and Kick questionnaire    Fear of Current or Ex-Partner: No    Emotionally Abused:  No    Physically Abused: No    Sexually Abused: No   Family Status  Relation Name Status   Mother  Deceased   Father  Deceased at age 12       heart   Sister  Alive   Brother  Alive   PGF  (Not Specified)   Other  (Not Specified)   Neg Hx  (Not Specified)   Family History  Problem Relation Age of Onset   Arthritis Mother    Cancer Mother 20       breast   Hyperlipidemia Mother    Colon polyps Mother 46   Hypertension Mother    Heart disease Mother    Depression Mother    Anxiety disorder Mother    Obesity Mother    Breast cancer Mother    Heart disease Father        CAD--passed away October 07, 2011 age 1   Hyperlipidemia Father    Hypertension Father    Obesity Father    Diabetes Father    Depression Father    Sleep apnea Father    Cancer Sister 62       breast   Breast cancer Sister    Sleep apnea Sister    Cancer Brother 52       prostate   Prostate cancer Brother    Atrial fibrillation Brother    Tremor Paternal Grandfather    Breast cancer Other    Prostate cancer Other    Colon cancer Neg Hx    Esophageal cancer Neg Hx    Rectal cancer Neg Hx    Stomach cancer Neg Hx    Allergies  Allergen Reactions   Lisinopril Cough      Review of Systems  Constitutional:  Negative for fever and malaise/fatigue.  HENT:  Negative for congestion.   Eyes:  Negative for blurred vision.  Respiratory:  Negative for shortness of breath.   Cardiovascular:  Negative for chest pain, palpitations and leg swelling.  Gastrointestinal:  Negative for abdominal pain, blood in stool and nausea.  Genitourinary:  Negative for dysuria and frequency.  Musculoskeletal:  Negative for falls.  Skin:  Negative for rash.  Neurological:  Negative for dizziness, loss of consciousness and headaches.  Endo/Heme/Allergies:  Negative for environmental allergies.  Psychiatric/Behavioral:  Negative for depression. The patient is not nervous/anxious.       Objective:     BP 110/70 (BP Location:  Left Arm, Patient Position: Sitting, Cuff Size: Normal)   Pulse (!) 54   Temp 98.4 F (36.9 C) (Oral)   Resp 18   Ht 5\' 9"  (1.753 m)   Wt 223 lb 12.8 oz (101.5 kg)   SpO2 98%   BMI 33.05 kg/m  BP Readings from Last 3 Encounters:  09/09/22 110/70  08/19/22 119/75  08/03/22 110/70   Wt Readings from Last 3 Encounters:  09/09/22 223 lb 12.8 oz (101.5 kg)  08/19/22 220 lb (99.8 kg)  08/03/22 231 lb (104.8 kg)   SpO2 Readings from Last 3 Encounters:  09/09/22 98%  08/19/22 99%  08/03/22 96%      Physical Exam Vitals and nursing note reviewed.  Constitutional:      General: He is not in acute distress.    Appearance: He is well-developed.  Cardiovascular:     Rate and Rhythm: Normal rate and regular rhythm.     Heart sounds: Normal heart sounds.  Pulmonary:     Effort: Pulmonary effort is normal. No respiratory distress.     Breath sounds: Normal breath sounds.  Psychiatric:        Behavior: Behavior normal.        Thought Content: Thought content normal.        Judgment: Judgment normal.      No results found for any visits on 09/09/22.  Last CBC Lab Results  Component Value Date   WBC 5.6 03/05/2022   HGB 15.6 03/05/2022   HCT 46.0 03/05/2022   MCV 91.7 03/05/2022   MCH 30.6 09/26/2018   RDW 14.1 03/05/2022   PLT 186.0 03/05/2022   Last metabolic panel Lab Results  Component Value Date   GLUCOSE 81 03/05/2022   NA 141 03/05/2022   K 4.2 03/05/2022   CL 103 03/05/2022   CO2 30 03/05/2022   BUN 20 03/05/2022   CREATININE 0.96 03/05/2022   GFRNONAA 64 10/22/2019   CALCIUM 9.3 03/05/2022   PROT 6.3 03/05/2022   ALBUMIN 4.2 03/05/2022   LABGLOB 2.2 10/22/2019   AGRATIO 2.0 10/22/2019   BILITOT 0.7 03/05/2022   ALKPHOS 55 03/05/2022   AST 12 03/05/2022   ALT 18 03/05/2022   ANIONGAP 4 (L) 09/26/2018   Last lipids Lab Results  Component Value Date   CHOL 127 03/05/2022   HDL 52.10 03/05/2022   LDLCALC 63 03/05/2022   TRIG 64.0 03/05/2022    CHOLHDL 2 03/05/2022   Last hemoglobin A1c Lab Results  Component Value Date   HGBA1C 5.3 03/05/2022   Last thyroid functions Lab Results  Component Value Date   TSH 0.58 03/05/2022   TSH 0.64 03/05/2022   T3TOTAL 127 12/14/2017   T4TOTAL 9.2 03/05/2022   Last vitamin D Lab Results  Component Value Date   VD25OH 70.37 03/05/2022   Last vitamin B12 and Folate Lab Results  Component Value Date   VITAMINB12 385 03/03/2020      The ASCVD Risk score (Arnett DK, et al., 2019) failed to calculate for the following reasons:   The valid total cholesterol range is 130 to 320 mg/dL    Assessment & Plan:   Problem List Items Addressed This Visit  Unprioritized   Vitamin D deficiency   Relevant Orders   VITAMIN D 25 Hydroxy (Vit-D Deficiency, Fractures)   Morbid obesity (HCC)   Relevant Medications   tirzepatide (MOUNJARO) 7.5 MG/0.5ML Pen   Other Relevant Orders   Insulin, random   VITAMIN D 25 Hydroxy (Vit-D Deficiency, Fractures)   DJD (degenerative joint disease)   Relevant Medications   meloxicam (MOBIC) 15 MG tablet   Sleep apnea    On cpap      Hyperlipidemia - Primary    Tolerating statin, encouraged heart healthy diet, avoid trans fats, minimize simple carbs and saturated fats. Increase exercise as tolerated       Relevant Medications   valsartan (DIOVAN) 80 MG tablet   Essential hypertension    Well controlled, no changes to meds. Encouraged heart healthy diet such as the DASH diet and exercise as tolerated.        Relevant Medications   valsartan (DIOVAN) 80 MG tablet   Other Relevant Orders   Lipid panel   TSH   Comprehensive metabolic panel   CBC with Differential/Platelet   Insulin, random   VITAMIN D 25 Hydroxy (Vit-D Deficiency, Fractures)   Diabetes mellitus (HCC)    hgba1c to be done today,  minimize simple carbs. Increase exercise as tolerated. Continue current meds       Relevant Medications   tirzepatide (MOUNJARO) 7.5  MG/0.5ML Pen   valsartan (DIOVAN) 80 MG tablet   Other Relevant Orders   Hemoglobin A1c   BMI 32.0-32.9,adult    CON'T WITH hww       Other Visit Diagnoses     Obstructive sleep apnea       Gastroesophageal reflux disease       Relevant Medications   pantoprazole (PROTONIX) 40 MG tablet     Assessment and Plan    Diabetes: Well controlled with lifestyle modifications, metformin, and Mounjaro injections. Patient reports difficulty obtaining Mounjaro due to supply issues. -Refill Mounjaro 7.5mg  injection at local pharmacy.  Upcoming Knee Surgery: Scheduled for 17th of the month. Patient is aware to stop Mounjaro 10 days prior to surgery. -Continue current plan.  Hypertension: Controlled on valsartan. -Refill valsartan as it is running low.  Chronic Pain: Managed with meloxicam. -Refill meloxicam as it is running low.  Gastroesophageal Reflux Disease: Managed with pantoprazole. -Refill pantoprazole as it is running low.  General Health Maintenance: -Continue Vitamin D supplementation. -Annual eye exam scheduled for next month. -Continue annual PSA screening, last done in December. -Continue annual A1C screening. -Continue foot care as part of diabetes management.  Follow-up in 6 months.        No follow-ups on file.    Donato Schultz, DO

## 2022-09-09 NOTE — Assessment & Plan Note (Signed)
On cpap 

## 2022-09-09 NOTE — Assessment & Plan Note (Signed)
Well controlled, no changes to meds. Encouraged heart healthy diet such as the DASH diet and exercise as tolerated.  °

## 2022-09-10 LAB — INSULIN, RANDOM: Insulin: 13.7 u[IU]/mL

## 2022-09-16 ENCOUNTER — Other Ambulatory Visit (HOSPITAL_COMMUNITY): Payer: Self-pay

## 2022-09-16 ENCOUNTER — Ambulatory Visit (INDEPENDENT_AMBULATORY_CARE_PROVIDER_SITE_OTHER): Payer: Medicare Other | Admitting: Family Medicine

## 2022-09-16 ENCOUNTER — Other Ambulatory Visit: Payer: Self-pay | Admitting: Family Medicine

## 2022-09-16 ENCOUNTER — Encounter (INDEPENDENT_AMBULATORY_CARE_PROVIDER_SITE_OTHER): Payer: Self-pay | Admitting: Family Medicine

## 2022-09-16 VITALS — BP 92/62 | HR 54 | Temp 98.4°F | Ht 69.0 in | Wt 218.0 lb

## 2022-09-16 DIAGNOSIS — Z6832 Body mass index (BMI) 32.0-32.9, adult: Secondary | ICD-10-CM | POA: Diagnosis not present

## 2022-09-16 DIAGNOSIS — E1169 Type 2 diabetes mellitus with other specified complication: Secondary | ICD-10-CM

## 2022-09-16 DIAGNOSIS — Z7984 Long term (current) use of oral hypoglycemic drugs: Secondary | ICD-10-CM

## 2022-09-16 DIAGNOSIS — Z7985 Long-term (current) use of injectable non-insulin antidiabetic drugs: Secondary | ICD-10-CM

## 2022-09-16 DIAGNOSIS — E669 Obesity, unspecified: Secondary | ICD-10-CM

## 2022-09-16 DIAGNOSIS — E559 Vitamin D deficiency, unspecified: Secondary | ICD-10-CM

## 2022-09-16 DIAGNOSIS — K219 Gastro-esophageal reflux disease without esophagitis: Secondary | ICD-10-CM

## 2022-09-16 MED ORDER — TIRZEPATIDE 7.5 MG/0.5ML ~~LOC~~ SOAJ
7.5000 mg | SUBCUTANEOUS | 0 refills | Status: DC
Start: 2022-09-16 — End: 2022-12-21
  Filled 2022-09-16: qty 2, 28d supply, fill #0

## 2022-09-16 MED ORDER — METFORMIN HCL 500 MG PO TABS
500.0000 mg | ORAL_TABLET | Freq: Every day | ORAL | 0 refills | Status: AC
Start: 2022-09-16 — End: ?

## 2022-09-18 ENCOUNTER — Encounter (INDEPENDENT_AMBULATORY_CARE_PROVIDER_SITE_OTHER): Payer: Self-pay | Admitting: Family Medicine

## 2022-09-20 ENCOUNTER — Encounter: Payer: Self-pay | Admitting: Family Medicine

## 2022-09-20 DIAGNOSIS — I10 Essential (primary) hypertension: Secondary | ICD-10-CM

## 2022-09-20 MED ORDER — VALSARTAN 80 MG PO TABS
80.0000 mg | ORAL_TABLET | Freq: Every day | ORAL | 1 refills | Status: DC
Start: 2022-09-20 — End: 2023-03-11

## 2022-09-20 NOTE — Progress Notes (Signed)
Chief Complaint:   OBESITY Eric [name] is here to discuss his progress with his obesity treatment plan along with follow-up of his obesity related diagnoses. Eric [name] is on the Category 3 Plan and states he is following his eating plan approximately 80% of the time. Eric [name] states he is walking 1 mile, lifting weights and doing leg exercises for 25-30 minutes 3 times per week.  Today's visit was #: 62 Starting weight: 288 lbs Starting date: 12/14/2017 Today's weight: 218 lbs Today's date: 09/16/2022 Total lbs lost to date: 70 Total lbs lost since last in-office visit: 2  Interim History: Patient continues to do well with his weight loss on his category 3 plan.  He is keeping active with walking and fishing.  Subjective:   1. Type 2 diabetes mellitus with other specified complication, with long-term current use of insulin (HCC) Patient is doing well on his medications and with his diet.  His A1c is well-controlled.  I discussed labs with the patient today.  2. Vitamin D deficiency Patient's vitamin D level is at goal.  He has no signs of over replacement.  Assessment/Plan:   1. Type 2 diabetes mellitus with other specified complication, with long-term current use of insulin (HCC) Patient will continue his medications, and we will refill Mounjaro 7.5 mg for 1 month and metformin 500 mg for 90 days.  - tirzepatide (MOUNJARO) 7.5 MG/0.5ML Pen; Inject 7.5 mg into the skin once a week. (Patient not taking: Reported on 09/17/2022)  Dispense: 2 mL; Refill: 0 - metFORMIN (GLUCOPHAGE) 500 MG tablet; Take 1 tablet (500 mg total) by mouth daily with breakfast.  Dispense: 90 tablet; Refill: 0  2. Vitamin D deficiency Patient will continue prescription vitamin D 50,000 IU once weekly (no refill needed).   3. BMI 32.0-32.9,adult  4. Obesity, Beginning BMI 43.79 Eric [name] is currently in the action stage of change. As such, his goal is to continue with weight loss efforts. He has agreed to the  Category 3 Plan.   Exercise goals: As is.   Behavioral modification strategies: increasing lean protein intake and decreasing eating out.  Eric [name] has agreed to follow-up with our clinic in 4 weeks. He was informed of the importance of frequent follow-up visits to maximize his success with intensive lifestyle modifications for his multiple health conditions.   Objective:   Blood pressure 92/62, pulse (!) 54, temperature 98.4 F (36.9 C), height 5\' 9"  (1.753 m), weight 218 lb (98.9 kg), SpO2 99 %. Body mass index is 32.19 kg/m.  Lab Results  Component Value Date   CREATININE 0.94 09/09/2022   BUN 17 09/09/2022   NA 141 09/09/2022   K 4.5 09/09/2022   CL 105 09/09/2022   CO2 29 09/09/2022   Lab Results  Component Value Date   ALT 16 09/09/2022   AST 13 09/09/2022   ALKPHOS 52 09/09/2022   BILITOT 0.8 09/09/2022   Lab Results  Component Value Date   HGBA1C 5.4 09/09/2022   HGBA1C 5.3 03/05/2022   HGBA1C 5.7 09/03/2021   HGBA1C 5.7 03/05/2021   HGBA1C 5.6 09/01/2020   Lab Results  Component Value Date   INSULIN 9.0 10/22/2019   INSULIN 21.3 12/14/2017   Lab Results  Component Value Date   TSH 0.62 09/09/2022   Lab Results  Component Value Date   CHOL 102 09/09/2022   HDL 40.40 09/09/2022   LDLCALC 48 09/09/2022   TRIG 68.0 09/09/2022   CHOLHDL 3 09/09/2022   Lab Results  Component  Value Date   VD25OH 77.43 09/09/2022   VD25OH 70.37 03/05/2022   VD25OH 66.83 09/03/2021   Lab Results  Component Value Date   WBC 6.6 09/09/2022   HGB 15.3 09/09/2022   HCT 47.1 09/09/2022   MCV 94.3 09/09/2022   PLT 199.0 09/09/2022   No results found for: "IRON", "TIBC", "FERRITIN"  Attestation Statements:   Reviewed by clinician on day of visit: allergies, medications, problem list, medical history, surgical history, family history, social history, and previous encounter notes.   I, Burt Knack, am acting as transcriptionist for Quillian Quince, MD.  I have  reviewed the above documentation for accuracy and completeness, and I agree with the above. -  Quillian Quince, MD

## 2022-09-21 NOTE — Patient Instructions (Signed)
SURGICAL WAITING ROOM VISITATION  Patients having surgery or a procedure may have no more than 2 support people in the waiting area - these visitors may rotate.    Children under the age of 63 must have an adult with them who is not the patient.  Due to an increase in RSV and influenza rates and associated hospitalizations, children ages 32 and under may not visit patients in [facility] hospitals.  If the patient needs to stay at the hospital during part of their recovery, the visitor guidelines for inpatient rooms apply. Pre-op nurse will coordinate an appropriate time for 1 support person to accompany patient in pre-op.  This support person may not rotate.    Please refer to the [facility] website for the visitor guidelines for Inpatients (after your surgery is over and you are in a regular room).       Your procedure is scheduled on: 10/04/22   Report to [facility] Main Entrance    Report to admitting at 5:15 AM   Call this number if you have problems the morning of surgery [phone]   Do not eat food :After Midnight.   After Midnight you may have the following liquids until 4:15 AM DAY OF SURGERY  Water Non-Citrus Juices (without pulp, NO RED-Apple, White grape, White cranberry) Black Coffee (NO MILK/CREAM OR CREAMERS, sugar ok)  Clear Tea (NO MILK/CREAM OR CREAMERS, sugar ok) regular and decaf                             Plain Jell-O (NO RED)                                           Fruit ices (not with fruit pulp, NO RED)                                     Popsicles (NO RED)                                                               Sports drinks like Gatorade (NO RED)                  The day of surgery:  Drink ONE (1) Pre-Surgery  G2 at 4:15AM the morning of surgery. Drink in one sitting. Do not sip.  This drink was given to you during your hospital  pre-op appointment visit. Nothing else to drink after completing the  Pre-Surgery  G2.       Oral Hygiene is also important to reduce your risk of infection.                                    Remember - BRUSH YOUR TEETH THE MORNING OF SURGERY WITH YOUR REGULAR TOOTHPASTE  DENTURES WILL BE REMOVED PRIOR TO SURGERY PLEASE DO NOT APPLY "Poly grip" OR ADHESIVES!!!   Do NOT smoke after Midnight   Take these medicines the morning of surgery Tylenol if  needed., Famotidine, Loratidine, Pantoprazole, Flomax, Nasal spray if needed and eye drops if needed.  DO NOT TAKE ANY ORAL DIABETIC MEDICATIONS DAY OF YOUR SURGERY. Do Not Take Metformin the day of surgery.  Bring CPAP mask and tubing day of surgery.                              You may not have any metal on your body including hair pins, jewelry, and body piercing             Do not wear make-up, lotions, powders, perfumes/cologne, or deodorant               Men may shave face and neck.   Do not bring valuables to the hospital. [facility] IS NOT             RESPONSIBLE   FOR VALUABLES.   Contacts, glasses, dentures or bridgework may not be worn into surgery.   Bring small overnight bag day of surgery.   DO NOT BRING YOUR HOME MEDICATIONS TO THE HOSPITAL. PHARMACY WILL DISPENSE MEDICATIONS LISTED ON YOUR MEDICATION LIST TO YOU DURING YOUR ADMISSION IN THE HOSPITAL!    Patients discharged on the day of surgery will not be allowed to drive home.  Someone NEEDS to stay with you for the first 24 hours after anesthesia.   Special Instructions: Bring a copy of your healthcare power of attorney and living will documents the day of surgery if you haven't scanned them before.              Please read over the following fact sheets you were given: IF YOU HAVE QUESTIONS ABOUT YOUR PRE-OP INSTRUCTIONS PLEASE CALL [phone]   If you received a COVID test during your pre-op visit  it is requested that you wear a mask when out in public, stay away from anyone that may not be feeling well and notify your surgeon if you develop symptoms.  If you test positive for Covid or have been in contact with anyone that has tested positive in the last 10 days please notify you surgeon.      Pre-operative 5 CHG Bath Instructions   You can play a key role in reducing the risk of infection after surgery. Your skin needs to be as free of germs as possible. You can reduce the number of germs on your skin by washing with CHG (chlorhexidine gluconate) soap before surgery. CHG is an antiseptic soap that kills germs and continues to kill germs even after washing.   DO NOT use if you have an allergy to chlorhexidine/CHG or antibacterial soaps. If your skin becomes reddened or irritated, stop using the CHG and notify one of our RNs at [phone].   Please shower with the CHG soap starting 4 days before surgery using the following schedule:     Please keep in mind the following:  DO NOT shave, including legs and underarms, starting the day of your first shower.   You may shave your face at any point before/day of surgery.  Place clean sheets on your bed the day you start using CHG soap. Use a clean washcloth (not used since being washed) for each shower. DO NOT sleep with pets once you start using the CHG.   CHG Shower Instructions:  If you choose to wash your hair and private area, wash first with your normal shampoo/soap.  After you use shampoo/soap, rinse your hair and  body thoroughly to remove shampoo/soap residue.  Turn the water OFF and apply about 3 tablespoons (45 ml) of CHG soap to a CLEAN washcloth.  Apply CHG soap ONLY FROM YOUR NECK DOWN TO YOUR TOES (washing for 3-5 minutes)  DO NOT use CHG soap on face, private areas, open wounds, or sores.  Pay special attention to the area where your surgery is being performed.  If you are having back surgery, having someone wash your back for you may be helpful. Wait 2 minutes after CHG soap is applied, then you may rinse off the CHG soap.  Pat dry with a clean towel  Put on clean  clothes/pajamas   If you choose to wear lotion, please use ONLY the CHG-compatible lotions on the back of this paper.     Additional instructions for the day of surgery: DO NOT APPLY any lotions, deodorants, cologne, or perfumes.   Put on clean/comfortable clothes.  Brush your teeth.  Ask your nurse before applying any prescription medications to the skin.      CHG Compatible Lotions   Aveeno Moisturizing lotion  Cetaphil Moisturizing Cream  Cetaphil Moisturizing Lotion  Clairol Herbal Essence Moisturizing Lotion, Dry Skin  Clairol Herbal Essence Moisturizing Lotion, Extra Dry Skin  Clairol Herbal Essence Moisturizing Lotion, Normal Skin  Curel Age Defying Therapeutic Moisturizing Lotion with Alpha Hydroxy  Curel Extreme Care Body Lotion  Curel Soothing Hands Moisturizing Hand Lotion  Curel Therapeutic Moisturizing Cream, Fragrance-Free  Curel Therapeutic Moisturizing Lotion, Fragrance-Free  Curel Therapeutic Moisturizing Lotion, Original Formula  Eucerin Daily Replenishing Lotion  Eucerin Dry Skin Therapy Plus Alpha Hydroxy Crme  Eucerin Dry Skin Therapy Plus Alpha Hydroxy Lotion  Eucerin Original Crme  Eucerin Original Lotion  Eucerin Plus Crme Eucerin Plus Lotion  Eucerin TriLipid Replenishing Lotion  Keri Anti-Bacterial Hand Lotion  Keri Deep Conditioning Original Lotion Dry Skin Formula Softly Scented  Keri Deep Conditioning Original Lotion, Fragrance Free Sensitive Skin Formula  Keri Lotion Fast Absorbing Fragrance Free Sensitive Skin Formula  Keri Lotion Fast Absorbing Softly Scented Dry Skin Formula  Keri Original Lotion  Keri Skin Renewal Lotion Keri Silky Smooth Lotion  Keri Silky Smooth Sensitive Skin Lotion  Nivea Body Creamy Conditioning Oil  Nivea Body Extra Enriched Lotion  Nivea Body Original Lotion  Nivea Body Sheer Moisturizing Lotion Nivea Crme  Nivea Skin Firming Lotion  NutraDerm 30 Skin Lotion  NutraDerm Skin Lotion  NutraDerm Therapeutic  Skin Cream  NutraDerm Therapeutic Skin Lotion  ProShield Protective Hand Cream  Provon moisturizing lotion How to Manage Your Diabetes Before and After Surgery  Why is it important to control my blood sugar before and after surgery? Improving blood sugar levels before and after surgery helps healing and can limit problems. A way of improving blood sugar control is eating a healthy diet by:  Eating less sugar and carbohydrates  Increasing activity/exercise  Talking with your doctor about reaching your blood sugar goals High blood sugars (greater than 180 mg/dL) can raise your risk of infections and slow your recovery, so you will need to focus on controlling your diabetes during the weeks before surgery. Make sure that the doctor who takes care of your diabetes knows about your planned surgery including the date and location.  How do I manage my blood sugar before surgery? Check your blood sugar at least 4 times a day, starting 2 days before surgery, to make sure that the level is not too high or low. Check your blood sugar the morning of  your surgery when you wake up and every 2 hours until you get to the Short Stay unit. If your blood sugar is less than 70 mg/dL, you will need to treat for low blood sugar: Do not take insulin. Treat a low blood sugar (less than 70 mg/dL) with  cup of clear juice (cranberry or apple), 4 glucose tablets, OR glucose gel. Recheck blood sugar in 15 minutes after treatment (to make sure it is greater than 70 mg/dL). If your blood sugar is not greater than 70 mg/dL on recheck, call [phone] for further instructions. Report your blood sugar to the short stay nurse when you get to Short Stay.  If you are admitted to the hospital after surgery: Your blood sugar will be checked by the staff and you will probably be given insulin after surgery (instead of oral diabetes medicines) to make sure you have good blood sugar levels. The goal for blood sugar control  after surgery is 80-180 mg/dL.   WHAT DO I DO ABOUT MY DIABETES MEDICATION?  Do not take oral diabetes medicines (pills) the morning of surgery.  THE NIGHT BEFORE SURGERY, take     units of       insulin.       THE MORNING OF SURGERY, take   units of         insulin.  DO NOT TAKE THE FOLLOWING 7 DAYS PRIOR TO SURGERY: Ozempic, Wegovy, Rybelsus (Semaglutide), Byetta (exenatide), Bydureon (exenatide ER), Victoza, Saxenda (liraglutide), or Trulicity (dulaglutide) Mounjaro (Tirzepatide) Adlyxin (Lixisenatide), Polyethylene Glycol Loxenatide.  Patient Signature:  Date:   Nurse Signature:  Date:  An incentive spirometer is a tool that can help keep your lungs clear and active. This tool measures how well you are filling your lungs with each breath. Taking long deep breaths may help reverse or decrease the chance of developing breathing (pulmonary) problems (especially infection) following: A long period of time when you are unable to move or be active. BEFORE THE PROCEDURE  If the spirometer includes an indicator to show your best effort, your nurse or respiratory therapist will set it to a desired goal. If possible, sit up straight or lean slightly forward. Try not to slouch. Hold the incentive spirometer in an upright position. INSTRUCTIONS FOR USE  Sit on the edge of your bed if possible, or sit up as far as you can in bed or on a chair. Hold the incentive spirometer in an upright position. Breathe out normally. Place the mouthpiece in your mouth and seal your lips tightly around it. Breathe in slowly and as deeply as possible, raising the piston or the ball toward the top of the column. Hold your breath for 3-5 seconds or for as long as possible. Allow the piston or ball to fall to the bottom of the column. Remove the mouthpiece from your mouth and breathe out normally. Rest for a few seconds and repeat Steps 1 through 7 at least 10 times every 1-2 hours when you are awake. Take your  time and take a few normal breaths between deep breaths. The spirometer may include an indicator to show your best effort. Use the indicator as a goal to work toward during each repetition. After each set of 10 deep breaths, practice coughing to be sure your lungs are clear. If you have an incision (the cut made at the time of surgery), support your incision when coughing by placing a pillow or rolled up towels firmly against it. Once you are able to  get out of bed, walk around indoors and cough well. You may stop using the incentive spirometer when instructed by your caregiver.  RISKS AND COMPLICATIONS Take your time so you do not get dizzy or light-headed. If you are in pain, you may need to take or ask for pain medication before doing incentive spirometry. It is harder to take a deep breath if you are having pain. AFTER USE Rest and breathe slowly and easily. It can be helpful to keep track of a log of your progress. Your caregiver can provide you with a simple table to help with this. If you are using the spirometer at home, follow these instructions: SEEK MEDICAL CARE IF:  You are having difficultly using the spirometer. You have trouble using the spirometer as often as instructed. Your pain medication is not giving enough relief while using the spirometer. You develop fever of 100.5 F (38.1 C) or higher. SEEK IMMEDIATE MEDICAL CARE IF:  You cough up bloody sputum that had not been present before. You develop fever of 102 F (38.9 C) or greater. You develop worsening pain at or near the incision site. MAKE SURE YOU:  Understand these instructions. Will watch your condition. Will get help right away if you are not doing well or get worse. Document Released: 07/19/2006 Document Revised: 05/31/2011 Document Reviewed: 09/19/2006 [facility] Patient Information 2014 [place], [place].

## 2022-09-21 NOTE — Progress Notes (Addendum)
COVID Vaccine received:  []  No [x]  Yes Date of any COVID positive Test in last 90 days: No PCP - Loreen Freud [facility]  DO Cardiologist -   Chest x-ray -  EKG -  08/04/22  EPIC Stress Test -  ECHO - 01/01/13  EPIC Cardiac Cath -   Bowel Prep - [x]  No  []   Yes ______  Pacemaker / ICD device [x]  No []  Yes   Spinal Cord Stimulator:[x]  No []  Yes       History of Sleep Apnea? []  No [x]  Yes   CPAP used?- []  No [x]  Yes    Does the patient monitor blood sugar?          [x]  No []  Yes  []  N/A  Patient has: []  NO Hx DM   [x]  Pre-DM                 []  DM1  []   DM2 Does patient have a [employer] or Dexacom? [x]  No []  Yes   Fasting Blood Sugar Ranges-  Checks Blood Sugar ___0__ times a day 0 GLP1 agonist / usual dose - Mounjaro GLP1 instructions: stopped last dose 10/09/22 SGLT-2 inhibitors / usual dose - no SGLT-2 instructions:   Blood Thinner / Instructions:no Aspirin Instructions:81mg  ASA last dose 7/29  Comments:   Activity level: Patient is able  to climb a flight of stairs without difficulty; [x]  No CP  [x]  No SOB, but would have ___   Patient can perform ADLs without assistance.   Anesthesia review: HTN,OSA,PREDM.,   Patient denies shortness of breath, fever, cough and chest pain at PAT appointment.  Patient verbalized understanding and agreement to the Pre-Surgical Instructions that were given to them at this PAT appointment. Patient was also educated of the need to review these PAT instructions again prior to his/her surgery.I reviewed the appropriate phone numbers to call if they have any and questions or concerns.

## 2022-09-21 NOTE — Progress Notes (Signed)
COVID Vaccine received:  []  No [x]  Yes Date of any COVID positive Test in last 90 days:  PCP - Seabron Spates  DO Cardiologist -   Chest x-ray -  EKG -  08/04/22  EPIC Stress Test -  ECHO - 01/01/13  EPIC Cardiac Cath -   Bowel Prep - []  No  []   Yes ______  Pacemaker / ICD device []  No []  Yes   Spinal Cord Stimulator:[]  No []  Yes       History of Sleep Apnea? []  No []  Yes   CPAP used?- []  No []  Yes    Does the patient monitor blood sugar?          []  No []  Yes  []  N/A  Patient has: []  NO Hx DM   []  Pre-DM                 []  DM1  []   DM2 Does patient have a [employer] or Dex[employer]? []  No []  Yes   Fasting Blood Sugar Ranges-  Checks Blood Sugar _____ times a day  GLP1 agonist / usual dose -  GLP1 instructions:  SGLT-2 inhibitors / usual dose -  SGLT-2 instructions:   Blood Thinner / Instructions: Aspirin Instructions:  Comments:   Activity level: Patient is able / unable to climb a flight of stairs without difficulty; []  No CP  []  No SOB, but would have ___   Patient can / can not perform ADLs without assistance.   Anesthesia review:   Patient denies shortness of breath, fever, cough and chest pain at PAT appointment.  Patient verbalized understanding and agreement to the Pre-Surgical Instructions that were given to them at this PAT appointment. Patient was also educated of the need to review these PAT instructions again prior to his/her surgery.I reviewed the appropriate phone numbers to call if they have any and questions or concerns.

## 2022-09-22 ENCOUNTER — Ambulatory Visit (HOSPITAL_COMMUNITY)
Admission: RE | Admit: 2022-09-22 | Discharge: 2022-09-22 | Disposition: A | Discharge status: Home / Self Care | Payer: Medicare Other | Source: Ambulatory Visit | Attending: Orthopedic Surgery | Admitting: Orthopedic Surgery

## 2022-09-22 ENCOUNTER — Encounter (HOSPITAL_COMMUNITY): Payer: Self-pay

## 2022-09-22 ENCOUNTER — Encounter (HOSPITAL_COMMUNITY)
Admission: RE | Admit: 2022-09-22 | Discharge: 2022-09-22 | Disposition: A | Discharge status: Home / Self Care | Payer: Medicare Other | Source: Ambulatory Visit | Attending: Orthopedic Surgery | Admitting: Orthopedic Surgery

## 2022-09-22 ENCOUNTER — Other Ambulatory Visit: Payer: Self-pay

## 2022-09-22 ENCOUNTER — Encounter: Payer: Self-pay | Admitting: Family Medicine

## 2022-09-22 VITALS — BP 125/66 | HR 52 | Temp 98.2°F | Resp 16 | Ht 69.0 in | Wt 221.0 lb

## 2022-09-22 DIAGNOSIS — M159 Polyosteoarthritis, unspecified: Secondary | ICD-10-CM

## 2022-09-22 DIAGNOSIS — Z794 Long term (current) use of insulin: Secondary | ICD-10-CM | POA: Insufficient documentation

## 2022-09-22 DIAGNOSIS — M150 Primary generalized (osteo)arthritis: Secondary | ICD-10-CM

## 2022-09-22 DIAGNOSIS — E1169 Type 2 diabetes mellitus with other specified complication: Secondary | ICD-10-CM | POA: Insufficient documentation

## 2022-09-22 DIAGNOSIS — Z01818 Encounter for other preprocedural examination: Secondary | ICD-10-CM | POA: Insufficient documentation

## 2022-09-22 DIAGNOSIS — Z96611 Presence of right artificial shoulder joint: Secondary | ICD-10-CM | POA: Diagnosis not present

## 2022-09-22 DIAGNOSIS — Z96612 Presence of left artificial shoulder joint: Secondary | ICD-10-CM | POA: Diagnosis not present

## 2022-09-22 HISTORY — DX: Malignant (primary) neoplasm, unspecified: C80.1

## 2022-09-22 LAB — CBC
HCT: 47.9 % (ref 39.0–52.0)
Hemoglobin: 15.5 g/dL (ref 13.0–17.0)
MCH: 31.1 pg (ref 26.0–34.0)
MCHC: 32.4 g/dL (ref 30.0–36.0)
MCV: 96 fL (ref 80.0–100.0)
Platelets: 177 10*3/uL (ref 150–400)
RBC: 4.99 MIL/uL (ref 4.22–5.81)
RDW: 13.2 % (ref 11.5–15.5)
WBC: 6.6 10*3/uL (ref 4.0–10.5)
nRBC: 0 % (ref 0.0–0.2)

## 2022-09-22 LAB — BASIC METABOLIC PANEL
Anion gap: 7 (ref 5–15)
BUN: 23 mg/dL (ref 8–23)
CO2: 28 mmol/L (ref 22–32)
Calcium: 9.4 mg/dL (ref 8.9–10.3)
Chloride: 105 mmol/L (ref 98–111)
Creatinine, Ser: 0.9 mg/dL (ref 0.61–1.24)
GFR, Estimated: >60 mL/min (ref >60)
Glucose, Bld: 97 mg/dL (ref 70–99)
Potassium: 4.1 mmol/L (ref 3.5–5.1)
Sodium: 140 mmol/L (ref 135–145)

## 2022-09-22 LAB — GLUCOSE, CAPILLARY: Glucose-Capillary: 108 mg/dL — ABNORMAL HIGH (ref 70–99)

## 2022-09-22 LAB — SURGICAL PCR SCREEN
MRSA, PCR: NEGATIVE
Staphylococcus aureus: NEGATIVE

## 2022-09-22 MED ORDER — MELOXICAM 15 MG PO TABS
15.0000 mg | ORAL_TABLET | Freq: Every day | ORAL | 1 refills | Status: DC
Start: 2022-09-22 — End: 2022-10-04

## 2022-09-22 NOTE — Care Plan (Signed)
Ortho Bundle Case Management Note  Patient Details  Name: EYON SIGERS MRN: [id number] Date of Birth: [birth year]      spoke with patient on the phone. he will discharge to home with family to assist. has all needed equipment at home. OPPT set up with [employer]. discharge instructions mailed to patient and discussed. questions answered. no other needs. Patient and MD in agreement with plan. Choice offered                DME Arranged:    DME Agency:     HH Arranged:    HH Agency:     Additional Comments: Please contact me with any questions of if this plan should need to change.  Shauna Hugh,  RN,BSN,MHA,CCM  [facility] Orthopaedic Specialist  [phone] 09/22/2022, 12:41 PM

## 2022-09-27 DIAGNOSIS — H524 Presbyopia: Secondary | ICD-10-CM | POA: Diagnosis not present

## 2022-09-27 DIAGNOSIS — H5203 Hypermetropia, bilateral: Secondary | ICD-10-CM | POA: Diagnosis not present

## 2022-09-27 DIAGNOSIS — Z7984 Long term (current) use of oral hypoglycemic drugs: Secondary | ICD-10-CM | POA: Diagnosis not present

## 2022-09-27 DIAGNOSIS — H52201 Unspecified astigmatism, right eye: Secondary | ICD-10-CM | POA: Diagnosis not present

## 2022-09-27 DIAGNOSIS — H25013 Cortical age-related cataract, bilateral: Secondary | ICD-10-CM | POA: Diagnosis not present

## 2022-09-27 DIAGNOSIS — H2513 Age-related nuclear cataract, bilateral: Secondary | ICD-10-CM | POA: Diagnosis not present

## 2022-09-27 DIAGNOSIS — E119 Type 2 diabetes mellitus without complications: Secondary | ICD-10-CM | POA: Diagnosis not present

## 2022-09-27 LAB — HM DIABETES EYE EXAM

## 2022-10-01 DIAGNOSIS — M1711 Unilateral primary osteoarthritis, right knee: Secondary | ICD-10-CM | POA: Diagnosis present

## 2022-10-01 NOTE — H&P (Signed)
TOTAL KNEE ADMISSION H&P  Patient is being admitted for right total knee arthroplasty.  Subjective:  Chief Complaint:right knee pain.  HPI: Eric [name], 77 y.o. male, has a history of pain and functional disability in the right knee due to arthritis and has failed non-surgical conservative treatments for greater than 12 weeks to includeNSAID's and/or analgesics, corticosteriod injections, viscosupplementation injections, flexibility and strengthening excercises, use of assistive devices, weight reduction as appropriate, and activity modification.  Onset of symptoms was gradual, starting 5 years ago with gradually worsening course since that time. The patient noted no past surgery on the right knee(s).  Patient currently rates pain in the right knee(s) at 10 out of 10 with activity. Patient has night pain, worsening of pain with activity and weight bearing, pain that interferes with activities of daily living, pain with passive range of motion, crepitus, and joint swelling.  Patient has evidence of subchondral sclerosis and joint space narrowing by imaging studies.   There is no active infection.  Patient Active Problem List   Diagnosis Date Noted   Osteoarthritis of right knee 10/01/2022   BMI 32.0-32.9,adult 08/19/2022   BMI 34.0-34.9,adult 05/19/2022   DJD (degenerative joint disease) 05/19/2022   BMI 33.0-33.9,adult 04/21/2022   Obesity, Beginning BMI 43.79 04/21/2022   Other constipation 12/21/2021   Diabetes mellitus (HCC) 09/28/2021   Bronchitis 02/03/2021   Depression 04/02/2020   Viral upper respiratory tract infection 08/31/2019   Folliculitis 08/31/2019   Preventative health care 03/02/2019   Morbid obesity (HCC) 03/02/2019   Hypotension 10/11/2018   Status post total hip replacement, right 09/25/2018   Osteoarthritis of right hip 09/21/2018   Prediabetes 08/29/2018   Vitamin D deficiency 08/29/2018   Primary osteoarthritis of right hip 08/29/2018   Thyroid nodule  05/12/2017   Primary osteoarthritis of left hip 03/28/2017   Status post total replacement of left hip 03/28/2017   Osteoarthritis of left hip 03/25/2017   Essential hypertension 11/18/2016   Hyperlipidemia LDL goal <100 11/18/2016   Need for prophylactic vaccination and inoculation against influenza 11/18/2016   Primary localized osteoarthrosis of right shoulder 05/25/2016   History of total replacement of right shoulder joint 05/25/2016   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   S/P shoulder replacement 10/14/2015   Low back pain 09/03/2015   Left foot pain 04/04/2014   Left shoulder pain 02/20/2014   Palpitations 12/25/2012   Left hip pain 12/25/2012   Class 3 severe obesity with serious comorbidity and body mass index (BMI) of 40.0 to 44.9 in adult [facility]) 12/25/2012   Acute bronchitis 12/25/2012   Urgency of urination 12/24/2011   Urinary frequency 12/24/2011   Preop examination 10/08/2010   Hyperlipidemia 10/08/2010   DECREASED HEARING 09/04/2009   INTERNAL HEMORRHOIDS WITHOUT MENTION COMP 09/04/2009   ALLERGIC RHINITIS, SEASONAL 09/04/2009   GERD 09/04/2009   DIVERTICULOSIS, COLON 09/04/2009   OSTEOARTHRITIS 09/04/2009   Sleep apnea 09/04/2009   Past Medical History:  Diagnosis Date   Alcohol abuse    Allergy    Back pain    Cancer (HCC)    Basal cell cancer   Cataract    forming   Colon polyps    Diverticulosis of colon    DJD (degenerative joint disease)    Dysrhythmia    Palpations from caffiene- reports he was having PACs due to slim fast wth caffeine - had full cardiac w/u and was told to cut back on caffeine;  now drink decaf bevrerages and no rpeort of recurrence  Enlarged prostate    GERD (gastroesophageal reflux disease)    Hearing loss    Hip osteoarthritis    Left   History of hay fever    Hx of cardiovascular stress test    Lexiscan Myoview (04/2013):  No ischemia, EF 61%; low risk   Hyperlipemia    Hypertension    Insulin  resistance    om metfromin    Internal hemorrhoids without mention of complication    Leg edema    Neuroma of foot    left   Obesity    Osteoarthrosis, unspecified whether generalized or localized, unspecified site    Palpitations    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   Primary localized osteoarthrosis of right shoulder 05/25/2016   Recent weight loss    75lbs   Seasonal allergies    Sleep apnea    CPAP   Thyroid nodule    calcified thyroid nodules- bx negative    Tremor of both hands     Past Surgical History:  Procedure Laterality Date   COLONOSCOPY     JOINT REPLACEMENT  2018   POLYPECTOMY     SHOULDER SURGERY  2009   right   TONSILLECTOMY     TOTAL HIP ARTHROPLASTY Left 03/28/2017   Procedure: TOTAL HIP ARTHROPLASTY ANTERIOR APPROACH;  Surgeon: Gean Birchwood, MD;  Location: [facility] OR;  Service: Orthopedics;  Laterality: Left;   TOTAL HIP ARTHROPLASTY Right 09/25/2018   Procedure: Right Anterior Hip Arthroplasty;  Surgeon: Gean Birchwood, MD;  Location: [facility];  Service: Orthopedics;  Laterality: Right;   TOTAL SHOULDER ARTHROPLASTY Left 10/14/2015   Procedure: LEFT TOTAL SHOULDER ARTHROPLASTY;  Surgeon: Teryl Lucy, MD;  Location: [facility] OR;  Service: Orthopedics;  Laterality: Left;   TOTAL SHOULDER ARTHROPLASTY Right 05/25/2016   Procedure: TOTAL SHOULDER ARTHROPLASTY;  Surgeon: Teryl Lucy, MD;  Location: [facility] OR;  Service: Orthopedics;  Laterality: Right;    No current facility-administered medications for this encounter.   Current Outpatient Medications  Medication Sig Dispense Refill Last Dose   acetaminophen (TYLENOL) 500 MG tablet Take 1,000 mg by mouth every 6 (six) hours as needed for moderate pain.      amoxicillin (AMOXIL) 500 MG capsule 4 po prior to procedure 30 capsule 3    aspirin EC 81 MG tablet Take 1 tablet (81 mg total) by mouth 2 (two) times daily. (Patient taking differently: Take 81 mg by mouth at bedtime.) 60 tablet 0    atorvastatin  (LIPITOR) 40 MG tablet TAKE ONE TABLET BY MOUTH ONCE DAILY (Patient taking differently: Take 40 mg by mouth at bedtime.) 90 tablet 1    calcium carbonate (TUMS - DOSED IN MG ELEMENTAL CALCIUM) 500 MG chewable tablet Chew 1,000 mg by mouth daily as needed for indigestion or heartburn.      clindamycin (CLEOCIN-T) 1 % lotion Apply topically 2 (two) times daily. (Patient taking differently: Apply 1 Application topically 2 (two) times daily as needed (infected hair follicles).) 60 mL 2    Coenzyme Q10 (COQ10) 200 MG CAPS Take 200 mg by mouth at bedtime.      famotidine (PEPCID) 20 MG tablet Take 20 mg by mouth at bedtime.      fluocinonide ointment (LIDEX) 0.05 % Apply 1 application topically daily as needed (for bites). 30 g 3    fluticasone (FLONASE) 50 MCG/ACT nasal spray Place 2 sprays into both nostrils daily as needed for allergies.       Ketotifen Fumarate (ALLERGY EYE DROPS OP) Place  1 drop into both eyes daily as needed (allergies).      loratadine (CLARITIN) 10 MG tablet Take 10 mg by mouth daily.      Magnesium 250 MG TABS Take 250 mg by mouth daily.      metFORMIN (GLUCOPHAGE) 500 MG tablet Take 1 tablet (500 mg total) by mouth daily with breakfast. 90 tablet 0    pantoprazole (PROTONIX) 40 MG tablet TAKE 1 TABLET BY MOUTH ONCE DAILY 90 tablet 1    tamsulosin (FLOMAX) 0.4 MG CAPS capsule Take 1 capsule (0.4 mg total) by mouth daily after supper. 90 capsule 1    Vitamin D, Ergocalciferol, (DRISDOL) 1.25 MG (50000 UNIT) CAPS capsule TAKE ONE CAPSULE BY MOUTH EVERY 7 DAYS 12 capsule 0    Zinc 50 MG CAPS Take 50 mg by mouth daily.      meloxicam (MOBIC) 15 MG tablet Take 1 tablet (15 mg total) by mouth daily. 90 tablet 1    tirzepatide (MOUNJARO) 7.5 MG/0.5ML Pen Inject 7.5 mg into the skin once a week. 6 mL 1    tirzepatide (MOUNJARO) 7.5 MG/0.5ML Pen Inject 7.5 mg into the skin once a week. (Patient not taking: Reported on 09/17/2022) 2 mL 0 Not Taking   valsartan (DIOVAN) 80 MG tablet Take 1  tablet (80 mg total) by mouth daily. 90 tablet 1    Allergies  Allergen Reactions   Lisinopril Cough    Social History   Tobacco Use   Smoking status: Never   Smokeless tobacco: Never  Substance Use Topics   Alcohol use: Not Currently    Alcohol/week: 3.0 standard drinks of alcohol    Types: 1 Glasses of wine, 2 Cans of beer per week    Comment: rare----12 a year    Family History  Problem Relation Age of Onset   Arthritis Mother    Cancer Mother 17       breast   Hyperlipidemia Mother    Colon polyps Mother 52   Hypertension Mother    Heart disease Mother    Depression Mother    Anxiety disorder Mother    Obesity Mother    Breast cancer Mother    Heart disease Father        CAD--passed away 10/13/2011 age 18   Hyperlipidemia Father    Hypertension Father    Obesity Father    Diabetes Father    Depression Father    Sleep apnea Father    Cancer Sister 74       breast   Breast cancer Sister    Sleep apnea Sister    Cancer Brother 91       prostate   Prostate cancer Brother    Atrial fibrillation Brother    Tremor Paternal Grandfather    Breast cancer Other    Prostate cancer Other    Colon cancer Neg Hx    Esophageal cancer Neg Hx    Rectal cancer Neg Hx    Stomach cancer Neg Hx      Review of Systems  Constitutional: Negative.   HENT:  Positive for hearing loss and tinnitus.   Eyes: Negative.   Respiratory: Negative.    Cardiovascular:        HTN  Gastrointestinal: Negative.   Endocrine: Negative.   Genitourinary:  Positive for frequency.  Musculoskeletal:  Positive for arthralgias, joint swelling and myalgias.  Skin: Negative.   Allergic/Immunologic: Negative.   Neurological:  Positive for tremors.  Hematological: Negative.   Psychiatric/Behavioral:  Negative.      Objective:  Physical Exam Constitutional:      Appearance: Normal appearance. He is normal weight.  HENT:     Head: Normocephalic and atraumatic.     Nose: Nose normal.  Eyes:      Pupils: Pupils are equal, round, and reactive to light.  Cardiovascular:     Pulses: Normal pulses.  Pulmonary:     Effort: Pulmonary effort is normal.  Musculoskeletal:        General: Tenderness present.     Cervical back: Normal range of motion and neck supple.     Comments: the patient has good strength in bilateral knees.  He has a range from 0-115 in bilateral knees.  Pain over the medial joint line of the left knee and lateral joint line of the right knee.  Mild crepitus with range of motion.  Calves are soft and nontender.  Skin:    General: Skin is warm and dry.  Neurological:     General: No focal deficit present.     Mental Status: He is alert and oriented to person, place, and time. Mental status is at baseline.  Psychiatric:        Mood and Affect: Mood normal.        Behavior: Behavior normal.        Thought Content: Thought content normal.        Judgment: Judgment normal.     Vital signs in last 24 hours:    Labs:   Estimated body mass index is 32.64 kg/m as calculated from the following:   Height as of 09/22/22: 5\' 9"  (1.753 m).   Weight as of 09/22/22: 100.2 kg.   Imaging Review Plain radiographs demonstrate bilateral AP weightbearing, bilateral Rosenberg, lateral sunrise views of bilateral knees are taken and reviewed in office today.  This shows bone-on-bone arthritis medial compartment left knee and on the [place] view for the right knee patient has near bone-on-bone arthritis lateral compartment.   Assessment/Plan:  End stage arthritis, right knee   The patient history, physical examination, clinical judgment of the provider and imaging studies are consistent with end stage degenerative joint disease of the right knee(s) and total knee arthroplasty is deemed medically necessary. The treatment options including medical management, injection therapy arthroscopy and arthroplasty were discussed at length. The risks and benefits of total knee arthroplasty  were presented and reviewed. The risks due to aseptic loosening, infection, stiffness, patella tracking problems, thromboembolic complications and other imponderables were discussed. The patient acknowledged the explanation, agreed to proceed with the plan and consent was signed. Patient is being admitted for inpatient treatment for surgery, pain control, PT, OT, prophylactic antibiotics, VTE prophylaxis, progressive ambulation and ADL's and discharge planning. The patient is planning to be discharged home with home health services     Patient's anticipated LOS is less than 2 midnights, meeting these requirements: - Younger than 22 - Lives within 1 hour of care - Has a competent adult at home to recover with post-op recover - NO history of  - Chronic pain requiring opiods  - Diabetes  - Coronary Artery Disease  - Heart failure  - Heart attack  - Stroke  - DVT/VTE  - Cardiac arrhythmia  - Respiratory Failure/COPD  - Renal failure  - Anemia  - Advanced Liver disease

## 2022-10-02 NOTE — Anesthesia Preprocedure Evaluation (Addendum)
Anesthesia Evaluation  Patient identified by MRN, date of birth, ID band Patient awake    Reviewed: Allergy & Precautions, NPO status , Patient's Chart, lab work & pertinent test results  Airway Mallampati: II  TM Distance: >3 FB Neck ROM: Full    Dental no notable dental hx. (+) Teeth Intact, Dental Advisory Given   Pulmonary sleep apnea and Continuous Positive Airway Pressure Ventilation    Pulmonary exam normal breath sounds clear to auscultation       Cardiovascular hypertension, Normal cardiovascular exam Rhythm:Regular Rate:Normal     Neuro/Psych  PSYCHIATRIC DISORDERS  Depression       GI/Hepatic ,GERD  ,,  Endo/Other  diabetes    Renal/GU Lab Results      Component                Value               Date                       K                        4.1                 09/22/2022                    Musculoskeletal  (+) Arthritis , Osteoarthritis,    Abdominal  (+) + obese  Peds  Hematology Lab Results      Component                Value               Date                      WBC                      6.6                 09/22/2022                HGB                      15.5                09/22/2022                HCT                      47.9                09/22/2022                PLT                      177                 09/22/2022              Anesthesia Other Findings   Reproductive/Obstetrics                              Anesthesia Physical Anesthesia Plan  ASA: 3  Anesthesia Plan: Spinal and Regional   Post-op Pain Management: Regional block* and Minimal or no pain anticipated  Induction:   PONV Risk Score and Plan: 2 and Propofol infusion, Treatment may vary due to age or medical condition and Ondansetron  Airway Management Planned: Nasal Cannula and Natural Airway  Additional Equipment: None  Intra-op Plan:   Post-operative Plan:   Informed  Consent: I have reviewed the patients History and Physical, chart, labs and discussed the procedure including the risks, benefits and alternatives for the proposed anesthesia with the patient or authorized representative who has indicated his/her understanding and acceptance.     Dental advisory given  Plan Discussed with: CRNA  Anesthesia Plan Comments: (Spinal w R adductor canal)         Anesthesia Quick Evaluation

## 2022-10-03 MED ORDER — TRANEXAMIC ACID 1000 MG/10ML IV SOLN
2000.0000 mg | INTRAVENOUS | Status: DC
  Filled 2022-10-03: qty 20

## 2022-10-04 ENCOUNTER — Encounter (HOSPITAL_COMMUNITY): Payer: Self-pay | Admitting: Orthopedic Surgery

## 2022-10-04 ENCOUNTER — Ambulatory Visit (HOSPITAL_COMMUNITY): Payer: Medicare Other | Admitting: Anesthesiology

## 2022-10-04 ENCOUNTER — Encounter (HOSPITAL_COMMUNITY)
Admission: RE | Disposition: A | Discharge status: Home / Self Care | Payer: Self-pay | Source: Ambulatory Visit | Attending: Orthopedic Surgery

## 2022-10-04 ENCOUNTER — Other Ambulatory Visit: Payer: Self-pay

## 2022-10-04 ENCOUNTER — Ambulatory Visit (HOSPITAL_COMMUNITY)
Admission: RE | Admit: 2022-10-04 | Discharge: 2022-10-04 | Disposition: A | Discharge status: Home / Self Care | Payer: Medicare Other | Source: Ambulatory Visit | Attending: Orthopedic Surgery | Admitting: Orthopedic Surgery

## 2022-10-04 DIAGNOSIS — G4730 Sleep apnea, unspecified: Secondary | ICD-10-CM | POA: Diagnosis not present

## 2022-10-04 DIAGNOSIS — Z96651 Presence of right artificial knee joint: Secondary | ICD-10-CM | POA: Diagnosis not present

## 2022-10-04 DIAGNOSIS — Z6832 Body mass index (BMI) 32.0-32.9, adult: Secondary | ICD-10-CM

## 2022-10-04 DIAGNOSIS — E1169 Type 2 diabetes mellitus with other specified complication: Secondary | ICD-10-CM

## 2022-10-04 DIAGNOSIS — M1711 Unilateral primary osteoarthritis, right knee: Secondary | ICD-10-CM

## 2022-10-04 DIAGNOSIS — G8918 Other acute postprocedural pain: Secondary | ICD-10-CM | POA: Diagnosis not present

## 2022-10-04 DIAGNOSIS — K219 Gastro-esophageal reflux disease without esophagitis: Secondary | ICD-10-CM | POA: Insufficient documentation

## 2022-10-04 DIAGNOSIS — E785 Hyperlipidemia, unspecified: Secondary | ICD-10-CM | POA: Diagnosis not present

## 2022-10-04 DIAGNOSIS — I10 Essential (primary) hypertension: Secondary | ICD-10-CM | POA: Insufficient documentation

## 2022-10-04 DIAGNOSIS — E119 Type 2 diabetes mellitus without complications: Secondary | ICD-10-CM | POA: Insufficient documentation

## 2022-10-04 DIAGNOSIS — Z7985 Long-term (current) use of injectable non-insulin antidiabetic drugs: Secondary | ICD-10-CM | POA: Diagnosis not present

## 2022-10-04 HISTORY — PX: TOTAL KNEE ARTHROPLASTY: SHX125

## 2022-10-04 SURGERY — ARTHROPLASTY, KNEE, TOTAL
Anesthesia: Regional | Site: Knee | Laterality: Right

## 2022-10-04 MED ORDER — METHOCARBAMOL 500 MG PO TABS
500.0000 mg | ORAL_TABLET | Freq: Four times a day (QID) | ORAL | Status: DC | PRN
  Administered 2022-10-04: 500 mg via ORAL

## 2022-10-04 MED ORDER — CHLORHEXIDINE GLUCONATE 0.12 % MT SOLN
15.0000 mL | Freq: Once | OROMUCOSAL | Status: AC
  Administered 2022-10-04: 15 mL via OROMUCOSAL

## 2022-10-04 MED ORDER — LIDOCAINE HCL (PF) 2 % IJ SOLN
INTRAMUSCULAR | Status: AC
  Filled 2022-10-04: qty 5

## 2022-10-04 MED ORDER — SODIUM CHLORIDE (PF) 0.9 % IJ SOLN
INTRAMUSCULAR | Status: AC
  Filled 2022-10-04: qty 50

## 2022-10-04 MED ORDER — OXYCODONE HCL 5 MG/5ML PO SOLN: 5.0000 mg | Freq: Once | ORAL | Status: DC | PRN

## 2022-10-04 MED ORDER — LACTATED RINGERS IV SOLN: INTRAVENOUS | Status: DC

## 2022-10-04 MED ORDER — OXYCODONE HCL 5 MG PO TABS
5.0000 mg | ORAL_TABLET | ORAL | Status: DC | PRN
  Administered 2022-10-04: 10 mg via ORAL

## 2022-10-04 MED ORDER — 0.9 % SODIUM CHLORIDE (POUR BTL) OPTIME
TOPICAL | Status: DC | PRN
  Administered 2022-10-04: 1000 mL

## 2022-10-04 MED ORDER — DEXAMETHASONE SODIUM PHOSPHATE 10 MG/ML IJ SOLN
INTRAMUSCULAR | Status: AC
  Filled 2022-10-04: qty 1

## 2022-10-04 MED ORDER — HYDROMORPHONE HCL 1 MG/ML IJ SOLN: 0.2500 mg | INTRAMUSCULAR | Status: DC | PRN

## 2022-10-04 MED ORDER — BUPIVACAINE-EPINEPHRINE 0.25% -1:200000 IJ SOLN
INTRAMUSCULAR | Status: AC
  Filled 2022-10-04: qty 1

## 2022-10-04 MED ORDER — SODIUM CHLORIDE (PF) 0.9 % IJ SOLN
INTRAMUSCULAR | Status: DC | PRN
  Administered 2022-10-04: 70 mL

## 2022-10-04 MED ORDER — PROPOFOL 10 MG/ML IV BOLUS
INTRAVENOUS | Status: AC
  Filled 2022-10-04: qty 20

## 2022-10-04 MED ORDER — EPHEDRINE SULFATE-NACL 50-0.9 MG/10ML-% IV SOSY
PREFILLED_SYRINGE | INTRAVENOUS | Status: DC | PRN
  Administered 2022-10-04 (×5): 5 mg via INTRAVENOUS

## 2022-10-04 MED ORDER — OXYCODONE-ACETAMINOPHEN 5-325 MG PO TABS: 1.0000 | ORAL_TABLET | ORAL | 0 refills | Status: DC | PRN

## 2022-10-04 MED ORDER — PHENYLEPHRINE HCL-NACL 20-0.9 MG/250ML-% IV SOLN
INTRAVENOUS | Status: DC | PRN
  Administered 2022-10-04: 20 ug/min via INTRAVENOUS

## 2022-10-04 MED ORDER — ACETAMINOPHEN 10 MG/ML IV SOLN: 1000.0000 mg | Freq: Once | INTRAVENOUS | Status: DC | PRN

## 2022-10-04 MED ORDER — PROPOFOL 1000 MG/100ML IV EMUL
INTRAVENOUS | Status: AC
  Filled 2022-10-04: qty 100

## 2022-10-04 MED ORDER — BUPIVACAINE LIPOSOME 1.3 % IJ SUSP
INTRAMUSCULAR | Status: DC | PRN
  Administered 2022-10-04: 20 mL

## 2022-10-04 MED ORDER — METHOCARBAMOL 500 MG PO TABS
ORAL_TABLET | ORAL | Status: AC
  Filled 2022-10-04: qty 1

## 2022-10-04 MED ORDER — ACETAMINOPHEN 500 MG PO TABS
ORAL_TABLET | ORAL | Status: AC
  Filled 2022-10-04: qty 2

## 2022-10-04 MED ORDER — PROPOFOL 10 MG/ML IV BOLUS
INTRAVENOUS | Status: DC | PRN
Start: 2022-10-04 — End: 2022-10-04
  Administered 2022-10-04: 20 mg via INTRAVENOUS

## 2022-10-04 MED ORDER — POVIDONE-IODINE 10 % EX SWAB: 2.0000 | Freq: Once | CUTANEOUS | Status: DC

## 2022-10-04 MED ORDER — ACETAMINOPHEN 500 MG PO TABS
1000.0000 mg | ORAL_TABLET | Freq: Four times a day (QID) | ORAL | Status: DC
  Administered 2022-10-04: 1000 mg via ORAL

## 2022-10-04 MED ORDER — CEFAZOLIN SODIUM-DEXTROSE 2-4 GM/100ML-% IV SOLN
2.0000 g | INTRAVENOUS | Status: AC
  Administered 2022-10-04: 2 g via INTRAVENOUS
  Filled 2022-10-04: qty 100

## 2022-10-04 MED ORDER — TIZANIDINE HCL 2 MG PO TABS: 2.0000 mg | ORAL_TABLET | Freq: Four times a day (QID) | ORAL | 0 refills | Status: DC | PRN

## 2022-10-04 MED ORDER — OXYCODONE HCL 5 MG PO TABS: 10.0000 mg | ORAL_TABLET | ORAL | Status: DC | PRN

## 2022-10-04 MED ORDER — SODIUM CHLORIDE (PF) 0.9 % IJ SOLN
INTRAMUSCULAR | Status: AC
  Filled 2022-10-04: qty 20

## 2022-10-04 MED ORDER — ROPIVACAINE HCL 5 MG/ML IJ SOLN
INTRAMUSCULAR | Status: DC | PRN
  Administered 2022-10-04: 30 mL via PERINEURAL

## 2022-10-04 MED ORDER — LACTATED RINGERS IV BOLUS
500.0000 mL | Freq: Once | INTRAVENOUS | Status: AC
  Administered 2022-10-04: 500 mL via INTRAVENOUS

## 2022-10-04 MED ORDER — FENTANYL CITRATE PF 50 MCG/ML IJ SOSY: 25.0000 ug | PREFILLED_SYRINGE | INTRAMUSCULAR | Status: DC | PRN

## 2022-10-04 MED ORDER — OXYCODONE HCL 5 MG PO TABS: 5.0000 mg | ORAL_TABLET | Freq: Once | ORAL | Status: DC | PRN

## 2022-10-04 MED ORDER — TRANEXAMIC ACID-NACL 1000-0.7 MG/100ML-% IV SOLN
1000.0000 mg | INTRAVENOUS | Status: AC
  Administered 2022-10-04: 1000 mg via INTRAVENOUS
  Filled 2022-10-04: qty 100

## 2022-10-04 MED ORDER — FENTANYL CITRATE (PF) 100 MCG/2ML IJ SOLN
INTRAMUSCULAR | Status: DC | PRN
  Administered 2022-10-04: 25 ug via INTRAVENOUS
  Administered 2022-10-04: 50 ug via INTRAVENOUS

## 2022-10-04 MED ORDER — ASPIRIN 81 MG PO TBEC: 81.0000 mg | DELAYED_RELEASE_TABLET | Freq: Two times a day (BID) | ORAL | 0 refills | Status: DC

## 2022-10-04 MED ORDER — CLONIDINE HCL (ANALGESIA) 100 MCG/ML EP SOLN
EPIDURAL | Status: DC | PRN
  Administered 2022-10-04: 100 ug

## 2022-10-04 MED ORDER — LACTATED RINGERS IV BOLUS
250.0000 mL | Freq: Once | INTRAVENOUS | Status: AC
  Administered 2022-10-04: 250 mL via INTRAVENOUS

## 2022-10-04 MED ORDER — PROPOFOL 500 MG/50ML IV EMUL
INTRAVENOUS | Status: DC | PRN
  Administered 2022-10-04: 100 ug/kg/min via INTRAVENOUS

## 2022-10-04 MED ORDER — DEXAMETHASONE SODIUM PHOSPHATE 10 MG/ML IJ SOLN
INTRAMUSCULAR | Status: DC | PRN
  Administered 2022-10-04: 4 mg via INTRAVENOUS

## 2022-10-04 MED ORDER — TRANEXAMIC ACID-NACL 1000-0.7 MG/100ML-% IV SOLN: 1000.0000 mg | Freq: Once | INTRAVENOUS | Status: DC

## 2022-10-04 MED ORDER — LIDOCAINE 2% (20 MG/ML) 5 ML SYRINGE
INTRAMUSCULAR | Status: DC | PRN
  Administered 2022-10-04: 40 mg via INTRAVENOUS

## 2022-10-04 MED ORDER — METHOCARBAMOL 500 MG IVPB - SIMPLE MED: 500.0000 mg | Freq: Four times a day (QID) | INTRAVENOUS | Status: DC | PRN

## 2022-10-04 MED ORDER — ONDANSETRON HCL 4 MG/2ML IJ SOLN
INTRAMUSCULAR | Status: AC
  Filled 2022-10-04: qty 2

## 2022-10-04 MED ORDER — BUPIVACAINE LIPOSOME 1.3 % IJ SUSP: 20.0000 mL | Freq: Once | INTRAMUSCULAR | Status: DC

## 2022-10-04 MED ORDER — FENTANYL CITRATE (PF) 100 MCG/2ML IJ SOLN
INTRAMUSCULAR | Status: AC
  Filled 2022-10-04: qty 2

## 2022-10-04 MED ORDER — ORAL CARE MOUTH RINSE: 15.0000 mL | Freq: Once | OROMUCOSAL | Status: AC

## 2022-10-04 MED ORDER — ONDANSETRON HCL 4 MG/2ML IJ SOLN
INTRAMUSCULAR | Status: DC | PRN
  Administered 2022-10-04: 4 mg via INTRAVENOUS

## 2022-10-04 MED ORDER — BUPIVACAINE-EPINEPHRINE (PF) 0.25% -1:200000 IJ SOLN
INTRAMUSCULAR | Status: DC | PRN
  Administered 2022-10-04: 30 mL via PERINEURAL

## 2022-10-04 MED ORDER — EPHEDRINE 5 MG/ML INJ
INTRAVENOUS | Status: AC
  Filled 2022-10-04: qty 5

## 2022-10-04 MED ORDER — MIDAZOLAM HCL 2 MG/2ML IJ SOLN
INTRAMUSCULAR | Status: AC
  Filled 2022-10-04: qty 2

## 2022-10-04 MED ORDER — ONDANSETRON HCL 4 MG/2ML IJ SOLN: 4.0000 mg | Freq: Once | INTRAMUSCULAR | Status: DC | PRN

## 2022-10-04 MED ORDER — OXYCODONE HCL 5 MG PO TABS
ORAL_TABLET | ORAL | Status: AC
  Filled 2022-10-04: qty 2

## 2022-10-04 MED ORDER — TRANEXAMIC ACID 1000 MG/10ML IV SOLN
INTRAVENOUS | Status: DC | PRN
  Administered 2022-10-04: 2000 mg via TOPICAL

## 2022-10-04 MED ORDER — WATER FOR IRRIGATION, STERILE IR SOLN
Status: DC | PRN
  Administered 2022-10-04: 2000 mL

## 2022-10-04 MED ORDER — BUPIVACAINE IN DEXTROSE 0.75-8.25 % IT SOLN
INTRATHECAL | Status: DC | PRN
  Administered 2022-10-04: 1.7 mL via INTRATHECAL

## 2022-10-04 MED ORDER — BUPIVACAINE LIPOSOME 1.3 % IJ SUSP
INTRAMUSCULAR | Status: AC
  Filled 2022-10-04: qty 20

## 2022-10-04 SURGICAL SUPPLY — 50 items
ATTUNE MED DOME PAT 41 KNEE (Knees) IMPLANT
ATTUNE PS FEM RT SZ 6 CEM KNEE (Femur) IMPLANT
ATTUNE PSRP INSR SZ6 5 KNEE (Insert) IMPLANT
BAG COUNTER SPONGE SURGICOUNT (BAG) IMPLANT
BAG DECANTER FOR FLEXI CONT (MISCELLANEOUS) ×1 IMPLANT
BAG SPEC THK2 15X12 ZIP CLS (MISCELLANEOUS) ×1
BAG SPNG CNTER NS LX DISP (BAG)
BAG ZIPLOCK 12X15 (MISCELLANEOUS) ×1 IMPLANT
BASE TIBIAL ROT PLAT SZ 7 KNEE (Knees) IMPLANT
BLADE SAG 18X100X1.27 (BLADE) ×1 IMPLANT
BLADE SAW SGTL 11.0X1.19X90.0M (BLADE) ×1 IMPLANT
BLADE SURG SZ10 CARB STEEL (BLADE) ×2 IMPLANT
BNDG CMPR MED 10X6 ELC LF (GAUZE/BANDAGES/DRESSINGS) ×1
BNDG ELASTIC 6X10 VLCR STRL LF (GAUZE/BANDAGES/DRESSINGS) ×1 IMPLANT
BOWL SMART MIX CTS (DISPOSABLE) ×1 IMPLANT
BSPLAT TIB 7 CMNT ROT PLAT STR (Knees) ×1 IMPLANT
CEMENT HV SMART SET (Cement) ×2 IMPLANT
COVER SURGICAL LIGHT HANDLE (MISCELLANEOUS) ×1 IMPLANT
DRAPE INCISE IOBAN 66X45 STRL (DRAPES) IMPLANT
DRAPE U-SHAPE 47X51 STRL (DRAPES) ×1 IMPLANT
DRSG AQUACEL AG ADV 3.5X10 (GAUZE/BANDAGES/DRESSINGS) ×1 IMPLANT
DURAPREP 26ML APPLICATOR (WOUND CARE) ×1 IMPLANT
ELECT REM PT RETURN 15FT ADLT (MISCELLANEOUS) ×1 IMPLANT
GLOVE BIO SURGEON STRL SZ7.5 (GLOVE) ×1 IMPLANT
GLOVE BIO SURGEON STRL SZ8.5 (GLOVE) ×1 IMPLANT
GLOVE BIOGEL PI IND STRL 8 (GLOVE) ×1 IMPLANT
GLOVE BIOGEL PI IND STRL 9 (GLOVE) ×1 IMPLANT
GOWN STRL REUS W/ TWL XL LVL3 (GOWN DISPOSABLE) ×2 IMPLANT
GOWN STRL REUS W/TWL XL LVL3 (GOWN DISPOSABLE) ×2
HANDPIECE INTERPULSE COAX TIP (DISPOSABLE) ×1
HOOD PEEL AWAY T7 (MISCELLANEOUS) ×3 IMPLANT
KIT TURNOVER KIT A (KITS) IMPLANT
NDL HYPO 21X1.5 SAFETY (NEEDLE) ×2 IMPLANT
NEEDLE HYPO 21X1.5 SAFETY (NEEDLE) ×2 IMPLANT
NS IRRIG 1000ML POUR BTL (IV SOLUTION) ×1 IMPLANT
PACK TOTAL KNEE CUSTOM (KITS) ×1 IMPLANT
PIN STEINMAN FIXATION KNEE (PIN) IMPLANT
PROTECTOR NERVE ULNAR (MISCELLANEOUS) ×1 IMPLANT
SET HNDPC FAN SPRY TIP SCT (DISPOSABLE) ×1 IMPLANT
SPIKE FLUID TRANSFER (MISCELLANEOUS) ×3 IMPLANT
SUT VIC AB 1 CTX 36 (SUTURE) ×1
SUT VIC AB 1 CTX36XBRD ANBCTR (SUTURE) ×1 IMPLANT
SUT VICRYL+ 3-0 36IN CT-1 (SUTURE) ×3 IMPLANT
SYR CONTROL 10ML LL (SYRINGE) ×2 IMPLANT
TIBIAL BASE ROT PLAT SZ 7 KNEE (Knees) ×1 IMPLANT
TRAY CATH INTERMITTENT SS 16FR (CATHETERS) IMPLANT
TRAY FOLEY MTR SLVR 16FR STAT (SET/KITS/TRAYS/PACK) ×1 IMPLANT
TUBE SUCTION HIGH CAP CLEAR NV (SUCTIONS) ×1 IMPLANT
WATER STERILE IRR 1000ML POUR (IV SOLUTION) ×2 IMPLANT
WRAP KNEE MAXI GEL POST OP (GAUZE/BANDAGES/DRESSINGS) ×1 IMPLANT

## 2022-10-04 NOTE — Transfer of Care (Signed)
Immediate Anesthesia Transfer of Care Note  Patient: Eric [name]  Procedure(s) Performed: RIGHT TOTAL KNEE ARTHROPLASTY (Right: Knee)  Patient Location: PACU  Anesthesia Type:Spinal  Level of Consciousness: awake, alert , and patient cooperative  Airway & Oxygen Therapy: Patient Spontanous Breathing and Patient connected to face mask oxygen  Post-op Assessment: Report given to RN and Post -op Vital signs reviewed and stable  Post vital signs: Reviewed and stable  Last Vitals:  Vitals Value Taken Time  BP 101/61 10/04/22 0917  Temp    Pulse 90 10/04/22 0918  Resp 12 10/04/22 0918  SpO2 91 % 10/04/22 0918  Vitals shown include unfiled device data.  Last Pain:  Vitals:   10/04/22 0557  TempSrc: Oral  PainSc:          Complications: No notable events documented.

## 2022-10-04 NOTE — Discharge Instructions (Addendum)

## 2022-10-04 NOTE — Op Note (Signed)
PATIENT ID:      Eric [name]  MRN:     [id number] DOB/AGE:    10-20-1945 / 77 y.o.       OPERATIVE REPORT   DATE OF PROCEDURE:  10/04/2022      PREOPERATIVE DIAGNOSIS:   RIGHT KNEE OSTEOARTHRITIS      Estimated body mass index is 32.64 kg/m as calculated from the following:   Height as of this encounter: 5\' 9"  (1.753 m).   Weight as of this encounter: 100.2 kg.                                                       POSTOPERATIVE DIAGNOSIS:   Same                                                                  PROCEDURE:  Procedure(s): RIGHT TOTAL KNEE ARTHROPLASTY Using DepuyAttune RP implants #6R Femur, #7Tibia, 5 mm Attune RP bearing, 41 Patella    SURGEON: Nestor Lewandowsky  ASSISTANT:   Tomi Likens. [employer]   (Present and scrubbed throughout the case, critical for assistance with exposure, retraction, instrumentation, and closure.)        ANESTHESIA: spinal, 20cc Exparel, 50cc 0.25% Marcaine EBL: 300 cc FLUID REPLACEMENT: 1500 cc crystaloid TOURNIQUET: DRAINS: None TRANEXAMIC ACID: 1gm IV, 2gm topical COMPLICATIONS:  None         INDICATIONS FOR PROCEDURE: The patient has  RIGHT KNEE OSTEOARTHRITIS, Val deformities, XR shows bone on bone arthritis, lateral subluxation of tibia. Patient has failed all conservative measures including anti-inflammatory medicines, narcotics, attempts at exercise and weight loss, cortisone injections and viscosupplementation.  Risks and benefits of surgery have been discussed, questions answered.   DESCRIPTION OF PROCEDURE: The patient identified by armband, received  IV antibiotics, in the holding area at [facility]. Patient taken to the operating room, appropriate anesthetic monitors were attached, and sp9inal anesthesia was  induced. IV Tranexamic acid was given. Lateral post and 2 surefoot positioners applied to the table, the lower extremity was then prepped and draped in usual sterile fashion from the toes to the high thigh. Time-out  procedure was performed. Tomi Likens. [facility] PAC, was present and scrubbed throughout the case, critical for assistance with, positioning, exposure, retraction, instrumentation, and closure.The skin and subcutaneous tissue along the incision was injected with 20 cc of a mixture of 20cc Exparel and 30cc Marcaine 50cc saline solution, using a 21-gauge by 1-1/2 inch needle. We began the operation, with the knee flexed 130 degrees, by making the anterior midline incision starting at handbreadth above the patella going over the patella 1 cm medial to and 4 cm distal to the tibial tubercle. Small bleeders in the skin and the subcutaneous tissue identified and cauterized. Transverse retinaculum was incised and reflected medially and a medial parapatellar arthrotomy was accomplished. the patella was everted and theprepatellar fat pad resected. The superficial medial collateral ligament was then elevated from anterior to posterior along the proximal flare of the tibia and anterior half of the menisci resected. The knee was hyperflexed exposing bone on bone arthritis. Peripheral  and notch osteophytes as well as the cruciate ligaments were then resected. We continued to work our way around posteriorly along the proximal tibia, and externally rotated the tibia subluxing it out from underneath the femur. A McHale PCL retractor was placed through the notch, a lateral Hohmann retractor, and anterolateral small homan retractor placed. We then entered the proximal tibia with the Depuy starter drill in line with the axis of the tibia followed by an intramedullary guide rod and 3-degree posterior slope cutting guide. The tibial cutting guide, was pinned into place allowing resection of 6 mm of bone medially and 1 mm of bone laterally. Satisfied with the tibial resection, we then entered the distal femur 2 mm anterior to the PCL origin with the starter drill, followed by the intramedullary guide rod and applied the distal femoral cutting  guide set at 9 mm, with 5 degrees of valgus. This was pinned along the epicondylar axis. At this point, the distal femoral cut was accomplished without difficulty. We then sized for a #6R femoral component and pinned the chamfer guide in 0 degrees of external rotation. The anterior, posterior, and chamfer cuts were accomplished without difficulty followed by the Attune RP box cutting guide and the box cut. We also removed posterior osteophytes from the posterior femoral condyles. The posterior capsule was injected with Exparel solution. The knee was brought into full extension. We checked our extension gap and fit a 5 mm trial lollipop. Distracting in extension with a lamina spreader,  bleeders in the posterior capsule, Posterior medial and posterior lateral gutter were cauterized.  The transexamic acid-soaked sponge was then placed in the gap of the knee in extension. The knee was flexed 30. The posterior patella cut was accomplished with the 9.5 mm Attune cutting guide, sized for a 41 mm dome, and the fixation pegs drilled.The knee was then once again hyperflexed exposing the proximal tibia. We sized for a # 7 tibial base plate, applied the smokestack and the conical reamer followed by the the Delta fin keel punch. We then hammered into place the Attune RP trial femoral component, drilled the lugs, inserted a  5 mm trial bearing, trial patellar button, and took the knee through range of motion from 0-130 degrees. Medial and lateral ligamentous stability was checked. No thumb pressure was required for patellar Tracking.  All trial components were removed, mating surfaces irrigated with pulse lavage, and dried with suction and sponges. 10 cc of the Exparel solution was applied to the cancellus bone of the patella distal femur and proximal tibia.  After waiting 30 seconds, the bony surfaces were again, dried with sponges. A double batch of DePuy HV cement was mixed and applied to all bony metallic mating surfaces  except for the posterior condyles of the femur itself. In order, we hammered into place the tibial tray and removed excess cement, the femoral component and removed excess cement. The final Attune RP bearing was inserted, and the knee brought to full extension with compression. The patellar button was clamped into place, and excess cement removed. The knee was held at 30 flexion with compression using the second surefoot, while the cement cured. The wound was irrigated out with normal saline solution pulse lavage. The rest of the Exparel was injected into the parapatellar arthrotomy, subcutaneous tissues, and periosteal tissues. The parapatellar arthrotomy was closed with running #1 Vicryl suture. The subcutaneous tissue with 3-0 undyed Vicryl suture, and the skin with running 3-0 SQ vicryl. An Aquacil dressing and Ace wrap  were applied. The patient was taken to recovery room without difficulty.   Nestor Lewandowsky 10/04/2022, 5:58 AM

## 2022-10-04 NOTE — Anesthesia Procedure Notes (Signed)
Procedure Name: MAC Date/Time: 10/04/2022 7:18 AM  Performed by: Sindy Guadeloupe, CRNAPre-anesthesia Checklist: Patient identified, Emergency Drugs available, Suction available, Patient being monitored and Timeout performed Oxygen Delivery Method: Simple face mask Placement Confirmation: positive ETCO2

## 2022-10-04 NOTE — Interval H&P Note (Signed)
History and Physical Interval Note:  10/04/2022 5:57 AM  Eric [name]  has presented today for surgery, with the diagnosis of RIGHT KNEE OSTEOARTHRITIS.  The various methods of treatment have been discussed with the patient and family. After consideration of risks, benefits and other options for treatment, the patient has consented to  Procedure(s): RIGHT TOTAL KNEE ARTHROPLASTY (Right) as a surgical intervention.  The patient's history has been reviewed, patient examined, no change in status, stable for surgery.  I have reviewed the patient's chart and labs.  Questions were answered to the patient's satisfaction.     Nestor Lewandowsky

## 2022-10-04 NOTE — Evaluation (Signed)
Physical Therapy Evaluation Patient Details Name: Eric [name] MRN: [id number] DOB: Feb 23, 1946 Today's Date: 10/04/2022  History of Present Illness  77 yo male presents to therapy s/p R TKA on 10/04/2022 due to failure of conservative measures. Pt PMH includes but is not limited to: DJD, hypotension, B THA, HTN, HDL,B TSA, LBP, OSA on CPAP, HOH, and tremor B UE.  Clinical Impression    Eric [name] is a 77 y.o. male POD 0 s/p R TKA. Patient reports mod I with mobility at baseline. Patient is now limited by functional impairments (see PT problem list below) and requires min guard and cues for transfers and gait with RW. Patient was able to ambulate 50 feet x 2  with RW and min guard and progressing to S and cues for safe walker management. Patient educated on safe sequencing for stair mobility with RW, car transfers, fall risk prevention, use of RW, pain management and CP pt and spouse verbalized understanding of safe guarding position for people assisting with mobility. Patient instructed in exercises to facilitate ROM and circulation reviewed and HO provided. Patient will benefit from continued skilled PT interventions to address impairments and progress towards PLOF. Patient has met mobility goals at adequate level for discharge home with family support and OPPT services starting 7/17; will continue to follow if pt continues acute stay to progress towards Mod I goals.       Assistance Recommended at Discharge Intermittent Supervision/Assistance  If plan is discharge home, recommend the following:  Can travel by private vehicle  A little help with walking and/or transfers;A little help with bathing/dressing/bathroom;Assistance with cooking/housework;Assist for transportation;Help with stairs or ramp for entrance        Equipment Recommendations None recommended by PT (pt reports DME in home setting)  Recommendations for Other Services       Functional Status Assessment Patient  has had a recent decline in their functional status and demonstrates the ability to make significant improvements in function in a reasonable and predictable amount of time.     Precautions / Restrictions Precautions Precautions: Knee;Fall Restrictions Weight Bearing Restrictions: No      Mobility  Bed Mobility Overal bed mobility: Needs Assistance Bed Mobility: Supine to Sit     Supine to sit: Min guard     General bed mobility comments: min cues and HOB elevated    Transfers Overall transfer level: Needs assistance Equipment used: Rolling walker (2 wheels) Transfers: Sit to/from Stand Sit to Stand: Min guard, From elevated surface           General transfer comment: cues for proper UE placement    Ambulation/Gait Ambulation/Gait assistance: Min guard Gait Distance (Feet): 50 Feet Assistive device: Rolling walker (2 wheels) Gait Pattern/deviations: Step-to pattern, Antalgic Gait velocity: decreased     General Gait Details: cues for proper sequencing and body position inside RW pt able to progress to close S for gait  Stairs Stairs: Yes Stairs assistance: Min guard Stair Management: Two rails Number of Stairs: 2 General stair comments: cues for proper sequencing and technique, PT demonstrated use of RW for single step navigation with pt and family  Wheelchair Mobility     Tilt Bed    Modified Rankin (Stroke Patients Only)       Balance Overall balance assessment: Needs assistance Sitting-balance support: Feet supported Sitting balance-Leahy Scale: Good     Standing balance support: Bilateral upper extremity supported, During functional activity, Reliant on assistive device for balance Standing balance-Leahy Scale: Poor  Pertinent Vitals/Pain Pain Assessment Pain Assessment: 0-10 Pain Score: 3  Pain Location: R knee Pain Descriptors / Indicators: Aching, Discomfort, Operative site guarding Pain  Intervention(s): Limited activity within patient's tolerance, Premedicated before session, Monitored during session, Repositioned, Ice applied    Home Living Family/patient expects to be discharged to:: Private residence Living Arrangements: Spouse/significant other Available Help at Discharge: Family Type of Home: House Home Access: Stairs to enter Entrance Stairs-Rails: None Entrance Stairs-Number of Steps: 1   Home Layout: One level Home Equipment: Agricultural consultant (2 wheels);Shower seat - built in;Grab bars - toilet;Grab bars - tub/shower;Toilet riser;Cane - single point      Prior Function Prior Level of Function : Independent/Modified Independent;Driving             Mobility Comments: recent use of SPC due to R knee pain, active and mod I with all ADLs, self care tasks, IADLs.       Hand Dominance   Dominant Hand: Right    Extremity/Trunk Assessment        Lower Extremity Assessment Lower Extremity Assessment: RLE deficits/detail RLE Deficits / Details: ankle DF/PF 5/5; SLR < 10 degree lag RLE Sensation: WNL    Cervical / Trunk Assessment Cervical / Trunk Assessment: Kyphotic (head forward)  Communication   Communication: HOH (B hearing aids)  Cognition Arousal/Alertness: Awake/alert Behavior During Therapy: WFL for tasks assessed/performed Overall Cognitive Status: Within Functional Limits for tasks assessed                                          General Comments      Exercises Total Joint Exercises Ankle Circles/Pumps: AROM, Both, 20 reps Quad Sets: AROM, Right, 5 reps Short Arc Quad: AROM, Right, 5 reps Heel Slides: AROM, Right, 5 reps Hip ABduction/ADduction: AROM, Right, 5 reps Straight Leg Raises: AROM, Right, 5 reps Knee Flexion: AROM, Right, 5 reps, Seated   Assessment/Plan    PT Assessment Patient needs continued PT services  PT Problem List Decreased strength;Decreased range of motion;Decreased balance;Decreased  activity tolerance;Decreased mobility;Decreased coordination;Pain       PT Treatment Interventions DME instruction;Gait training;Stair training;Functional mobility training;Therapeutic activities;Therapeutic exercise;Balance training;Neuromuscular re-education;Patient/family education;Modalities    PT Goals (Current goals can be found in the Care Plan section)  Acute Rehab PT Goals Patient Stated Goal: climb a tree stand, yard work, fishing PT Goal Formulation: With patient Time For Goal Achievement: 10/18/22 Potential to Achieve Goals: Good    Frequency 7X/week     Co-evaluation               AM-PAC PT "6 Clicks" Mobility  Outcome Measure Help needed turning from your back to your side while in a flat bed without using bedrails?: None Help needed moving from lying on your back to sitting on the side of a flat bed without using bedrails?: A Little Help needed moving to and from a bed to a chair (including a wheelchair)?: A Little Help needed standing up from a chair using your arms (e.g., wheelchair or bedside chair)?: A Little Help needed to walk in hospital room?: A Little Help needed climbing 3-5 steps with a railing? : A Little 6 Click Score: 19    End of Session Equipment Utilized During Treatment: Gait belt Activity Tolerance: Patient tolerated treatment well Patient left: in chair;with call bell/phone within reach;with family/visitor present Nurse Communication: Mobility status;Other (comment) (pt readiness for  d/c from PT standpoint) PT Visit Diagnosis: Unsteadiness on feet (R26.81);Muscle weakness (generalized) (M62.81);Other abnormalities of gait and mobility (R26.89);Difficulty in walking, not elsewhere classified (R26.2);Pain Pain - Right/Left: Right Pain - part of body: Knee;Leg    Time: 0102-7253 PT Time Calculation (min) (ACUTE ONLY): 39 min   Charges:   PT Evaluation $PT Eval Low Complexity: 1 Low PT Treatments $Gait Training: 8-22  mins $Therapeutic Exercise: 8-22 mins PT General Charges $$ ACUTE PT VISIT: 1 Visit         Johnny Bridge, PT Acute Rehab   Jacqualyn Posey 10/04/2022, 1:35 PM

## 2022-10-04 NOTE — Anesthesia Procedure Notes (Signed)
Spinal  Patient location during procedure: OR Start time: 10/04/2022 7:27 AM Reason for block: surgical anesthesia Staffing Performed: resident/CRNA  Anesthesiologist: Trevor Iha, MD Resident/CRNA: Sindy Guadeloupe, CRNA Performed by: Sindy Guadeloupe, CRNA Authorized by: Trevor Iha, MD   Preanesthetic Checklist Completed: patient identified, IV checked, site marked, risks and benefits discussed, surgical consent, monitors and equipment checked, pre-op evaluation and timeout performed Spinal Block Patient position: sitting Prep: ChloraPrep, DuraPrep and site prepped and draped Patient monitoring: continuous pulse ox, blood pressure, heart rate and cardiac monitor Approach: midline Location: L3-4 Injection technique: single-shot Needle Needle type: Pencan  Needle gauge: 24 G Needle length: 10 cm Assessment Events: CSF return Additional Notes Pt placed in sitting position, spinal kit expiration date checked and verified, + CSF, - heme, pt tolerated well. Adequate sensory level. Dr Richardson Landry present and supervising throughout.

## 2022-10-04 NOTE — Anesthesia Procedure Notes (Signed)
Anesthesia Regional Block: Adductor canal block   Pre-Anesthetic Checklist: , timeout performed,  Correct Patient, Correct Site, Correct Laterality,  Correct Procedure, Correct Position, site marked,  Risks and benefits discussed,  Surgical consent,  Pre-op evaluation,  At surgeon's request and post-op pain management  Laterality: Lower and Right  Prep: chloraprep       Needles:  Injection technique: Single-shot  Needle Type: Echogenic Needle     Needle Length: 9cm  Needle Gauge: 22     Additional Needles:   Procedures:,,,, ultrasound used (permanent image in chart),,    Narrative:  Start time: 10/04/2022 6:51 AM End time: 10/04/2022 6:57 AM Injection made incrementally with aspirations every 5 mL.  Performed by: Personally  Anesthesiologist: Trevor Iha, MD  Additional Notes: Block assessed prior to surgery. Pt tolerated procedure well.

## 2022-10-04 NOTE — Progress Notes (Signed)
Orthopedic Tech Progress Note Patient Details:  Eric [name] 06/29/1945 [id number] Applied bone foam per order.  Ortho Devices Type of Ortho Device: Bone foam zero knee Ortho Device/Splint Location: RLE Ortho Device/Splint Interventions: Ordered, Application, Adjustment   Post Interventions Patient Tolerated: Well Instructions Provided: Adjustment of device, Care of device  Blase Mess 10/04/2022, 9:34 AM

## 2022-10-04 NOTE — Anesthesia Postprocedure Evaluation (Signed)
Anesthesia Post Note  Patient: Eric [name]  Procedure(s) Performed: RIGHT TOTAL KNEE ARTHROPLASTY (Right: Knee)     Patient location during evaluation: Nursing Unit Anesthesia Type: Regional Level of consciousness: oriented and awake and alert Pain management: pain level controlled Vital Signs Assessment: post-procedure vital signs reviewed and stable Respiratory status: spontaneous breathing and respiratory function stable Cardiovascular status: blood pressure returned to baseline and stable Postop Assessment: no headache, no backache, no apparent nausea or vomiting and patient able to bend at knees Anesthetic complications: no   No notable events documented.  Last Vitals:  Vitals:   10/04/22 0930 10/04/22 0945  BP: 95/61 100/62  Pulse: 66 71  Resp: 16 17  Temp:    SpO2: 92% 94%    Last Pain:  Vitals:   10/04/22 0917  TempSrc:   PainSc: 0-No pain    LLE Motor Response: No movement due to regional block (10/04/22 0945) LLE Sensation: Decreased (dt block) (10/04/22 0945) RLE Motor Response: No movement due to regional block (10/04/22 0945) RLE Sensation: Decreased (dt block) (10/04/22 0945) L Sensory Level: L3-Anterior knee, lower leg (10/04/22 0945) R Sensory Level: L2-Upper inner thigh, upper buttock (10/04/22 0945)  Trevor Iha

## 2022-10-04 NOTE — Addendum Note (Signed)
Addendum  created 10/04/22 1024 by Trevor Iha, MD   Child order released for a procedure order, Clinical Note Signed, Intraprocedure Blocks edited, Intraprocedure Meds edited, SmartForm saved

## 2022-10-05 ENCOUNTER — Encounter (HOSPITAL_COMMUNITY): Payer: Self-pay | Admitting: Orthopedic Surgery

## 2022-10-06 DIAGNOSIS — R262 Difficulty in walking, not elsewhere classified: Secondary | ICD-10-CM | POA: Diagnosis not present

## 2022-10-06 DIAGNOSIS — M25561 Pain in right knee: Secondary | ICD-10-CM | POA: Diagnosis not present

## 2022-10-08 DIAGNOSIS — M25561 Pain in right knee: Secondary | ICD-10-CM | POA: Diagnosis not present

## 2022-10-08 DIAGNOSIS — R262 Difficulty in walking, not elsewhere classified: Secondary | ICD-10-CM | POA: Diagnosis not present

## 2022-10-11 ENCOUNTER — Other Ambulatory Visit (HOSPITAL_COMMUNITY): Payer: Self-pay

## 2022-10-11 DIAGNOSIS — R262 Difficulty in walking, not elsewhere classified: Secondary | ICD-10-CM | POA: Diagnosis not present

## 2022-10-11 DIAGNOSIS — M25561 Pain in right knee: Secondary | ICD-10-CM | POA: Diagnosis not present

## 2022-10-12 ENCOUNTER — Other Ambulatory Visit (HOSPITAL_COMMUNITY): Payer: Self-pay

## 2022-10-13 ENCOUNTER — Other Ambulatory Visit (HOSPITAL_COMMUNITY): Payer: Self-pay

## 2022-10-13 DIAGNOSIS — M25561 Pain in right knee: Secondary | ICD-10-CM | POA: Diagnosis not present

## 2022-10-13 DIAGNOSIS — M1711 Unilateral primary osteoarthritis, right knee: Secondary | ICD-10-CM | POA: Diagnosis not present

## 2022-10-13 DIAGNOSIS — R262 Difficulty in walking, not elsewhere classified: Secondary | ICD-10-CM | POA: Diagnosis not present

## 2022-10-15 DIAGNOSIS — R262 Difficulty in walking, not elsewhere classified: Secondary | ICD-10-CM | POA: Diagnosis not present

## 2022-10-15 DIAGNOSIS — M25561 Pain in right knee: Secondary | ICD-10-CM | POA: Diagnosis not present

## 2022-10-18 DIAGNOSIS — M25561 Pain in right knee: Secondary | ICD-10-CM | POA: Diagnosis not present

## 2022-10-18 DIAGNOSIS — R262 Difficulty in walking, not elsewhere classified: Secondary | ICD-10-CM | POA: Diagnosis not present

## 2022-10-19 ENCOUNTER — Encounter (INDEPENDENT_AMBULATORY_CARE_PROVIDER_SITE_OTHER): Payer: Self-pay | Admitting: Family Medicine

## 2022-10-19 ENCOUNTER — Ambulatory Visit (INDEPENDENT_AMBULATORY_CARE_PROVIDER_SITE_OTHER): Payer: Medicare Other | Admitting: Family Medicine

## 2022-10-19 VITALS — BP 109/64 | HR 63 | Temp 98.4°F | Ht 69.0 in | Wt 217.0 lb

## 2022-10-19 DIAGNOSIS — E1165 Type 2 diabetes mellitus with hyperglycemia: Secondary | ICD-10-CM

## 2022-10-19 DIAGNOSIS — E669 Obesity, unspecified: Secondary | ICD-10-CM | POA: Diagnosis not present

## 2022-10-19 DIAGNOSIS — Z6832 Body mass index (BMI) 32.0-32.9, adult: Secondary | ICD-10-CM | POA: Diagnosis not present

## 2022-10-20 DIAGNOSIS — R262 Difficulty in walking, not elsewhere classified: Secondary | ICD-10-CM | POA: Diagnosis not present

## 2022-10-20 DIAGNOSIS — M25561 Pain in right knee: Secondary | ICD-10-CM | POA: Diagnosis not present

## 2022-10-21 ENCOUNTER — Encounter: Payer: Self-pay | Admitting: Family Medicine

## 2022-10-25 NOTE — Progress Notes (Unsigned)
Chief Complaint:   OBESITY Aung is here to discuss his progress with his obesity treatment plan along with follow-up of his obesity related diagnoses. Marquale is on the Category 3 Plan and states he is following his eating plan approximately 75% of the time. Manual states he is doing physical therapy for 60 minutes 3 times per week.  Today's visit was #: 63 Starting weight: 288 lbs Starting date: 12/14/2017 Today's weight: 217 lbs Today's date: 10/19/2022 Total lbs lost to date: 71 Total lbs lost since last in-office visit: 1  Interim History: Patient is recovering from knee replacement surgery. He is doing physical therapy for exercise and his knee appears to be healing appropriately. He has done well with avoiding weight gain that is often seen while people recover from surgery.   Subjective:   1. Type 2 diabetes mellitus with hyperglycemia, without long-term current use of insulin (HCC) Patient has been off Mounjaro with his knee replacement surgery. He has not restarted yet. His goal is to come off Sandwich eventually. He is doing well with his diet and exercise.   Assessment/Plan:   1. Type 2 diabetes mellitus with hyperglycemia, without long-term current use of insulin (HCC) Patient will restart Mounjaro 7.5 mg once weekly, no refill needed. We will follow-up at his next visit in 1 month.   2. BMI 32.0-32.9,adult  3. Obesity, Beginning BMI 43.79 Styles is currently in the action stage of change. As such, his goal is to continue with weight loss efforts. He has agreed to the Category 3 Plan.   Exercise goals: As is.   Behavioral modification strategies: no skipping meals and meal planning and cooking strategies.  Demarien has agreed to follow-up with our clinic in 4 weeks. He was informed of the importance of frequent follow-up visits to maximize his success with intensive lifestyle modifications for his multiple health conditions.   Objective:   Blood pressure  109/64, pulse 63, temperature 98.4 F (36.9 C), height 5\' 9"  (1.753 m), weight 217 lb (98.4 kg), SpO2 96%. Body mass index is 32.05 kg/m.  Lab Results  Component Value Date   CREATININE 0.90 09/22/2022   BUN 23 09/22/2022   NA 140 09/22/2022   K 4.1 09/22/2022   CL 105 09/22/2022   CO2 28 09/22/2022   Lab Results  Component Value Date   ALT 16 09/09/2022   AST 13 09/09/2022   ALKPHOS 52 09/09/2022   BILITOT 0.8 09/09/2022   Lab Results  Component Value Date   HGBA1C 5.4 09/09/2022   HGBA1C 5.3 03/05/2022   HGBA1C 5.7 09/03/2021   HGBA1C 5.7 03/05/2021   HGBA1C 5.6 09/01/2020   Lab Results  Component Value Date   INSULIN 9.0 10/22/2019   INSULIN 21.3 12/14/2017   Lab Results  Component Value Date   TSH 0.62 09/09/2022   Lab Results  Component Value Date   CHOL 102 09/09/2022   HDL 40.40 09/09/2022   LDLCALC 48 09/09/2022   TRIG 68.0 09/09/2022   CHOLHDL 3 09/09/2022   Lab Results  Component Value Date   VD25OH 77.43 09/09/2022   VD25OH 70.37 03/05/2022   VD25OH 66.83 09/03/2021   Lab Results  Component Value Date   WBC 6.6 09/22/2022   HGB 15.5 09/22/2022   HCT 47.9 09/22/2022   MCV 96.0 09/22/2022   PLT 177 09/22/2022   No results found for: "IRON", "TIBC", "FERRITIN"  Attestation Statements:   Reviewed by clinician on day of visit: allergies, medications, problem list,  medical history, surgical history, family history, social history, and previous encounter notes.  Time spent on visit including pre-visit chart review and post-visit care and charting was 30 minutes.   I, Burt Knack, am acting as transcriptionist for Quillian Quince, MD.  I have reviewed the above documentation for accuracy and completeness, and I agree with the above. -  Quillian Quince, MD

## 2022-10-29 DIAGNOSIS — M25561 Pain in right knee: Secondary | ICD-10-CM | POA: Diagnosis not present

## 2022-10-29 DIAGNOSIS — R262 Difficulty in walking, not elsewhere classified: Secondary | ICD-10-CM | POA: Diagnosis not present

## 2022-11-02 DIAGNOSIS — R262 Difficulty in walking, not elsewhere classified: Secondary | ICD-10-CM | POA: Diagnosis not present

## 2022-11-02 DIAGNOSIS — M25561 Pain in right knee: Secondary | ICD-10-CM | POA: Diagnosis not present

## 2022-11-04 DIAGNOSIS — M25561 Pain in right knee: Secondary | ICD-10-CM | POA: Diagnosis not present

## 2022-11-04 DIAGNOSIS — R262 Difficulty in walking, not elsewhere classified: Secondary | ICD-10-CM | POA: Diagnosis not present

## 2022-11-08 DIAGNOSIS — R262 Difficulty in walking, not elsewhere classified: Secondary | ICD-10-CM | POA: Diagnosis not present

## 2022-11-08 DIAGNOSIS — M25561 Pain in right knee: Secondary | ICD-10-CM | POA: Diagnosis not present

## 2022-11-11 DIAGNOSIS — M1711 Unilateral primary osteoarthritis, right knee: Secondary | ICD-10-CM | POA: Diagnosis not present

## 2022-11-11 DIAGNOSIS — M25561 Pain in right knee: Secondary | ICD-10-CM | POA: Diagnosis not present

## 2022-11-11 DIAGNOSIS — R262 Difficulty in walking, not elsewhere classified: Secondary | ICD-10-CM | POA: Diagnosis not present

## 2022-11-15 DIAGNOSIS — M25561 Pain in right knee: Secondary | ICD-10-CM | POA: Diagnosis not present

## 2022-11-15 DIAGNOSIS — R262 Difficulty in walking, not elsewhere classified: Secondary | ICD-10-CM | POA: Diagnosis not present

## 2022-11-18 ENCOUNTER — Encounter (INDEPENDENT_AMBULATORY_CARE_PROVIDER_SITE_OTHER): Payer: Self-pay | Admitting: Family Medicine

## 2022-11-18 ENCOUNTER — Other Ambulatory Visit (HOSPITAL_COMMUNITY): Payer: Self-pay

## 2022-11-18 ENCOUNTER — Ambulatory Visit (INDEPENDENT_AMBULATORY_CARE_PROVIDER_SITE_OTHER): Payer: Medicare Other | Admitting: Family Medicine

## 2022-11-18 VITALS — BP 107/71 | HR 78 | Temp 98.2°F | Ht 68.0 in | Wt 208.0 lb

## 2022-11-18 DIAGNOSIS — Z6831 Body mass index (BMI) 31.0-31.9, adult: Secondary | ICD-10-CM

## 2022-11-18 DIAGNOSIS — K5909 Other constipation: Secondary | ICD-10-CM

## 2022-11-18 DIAGNOSIS — E669 Obesity, unspecified: Secondary | ICD-10-CM

## 2022-11-18 DIAGNOSIS — R262 Difficulty in walking, not elsewhere classified: Secondary | ICD-10-CM | POA: Diagnosis not present

## 2022-11-18 DIAGNOSIS — E559 Vitamin D deficiency, unspecified: Secondary | ICD-10-CM | POA: Diagnosis not present

## 2022-11-18 DIAGNOSIS — M25561 Pain in right knee: Secondary | ICD-10-CM | POA: Diagnosis not present

## 2022-11-18 MED ORDER — VITAMIN D (ERGOCALCIFEROL) 1.25 MG (50000 UNIT) PO CAPS
ORAL_CAPSULE | ORAL | 0 refills | Status: DC
Start: 2022-11-18 — End: 2023-02-01

## 2022-11-19 ENCOUNTER — Encounter (INDEPENDENT_AMBULATORY_CARE_PROVIDER_SITE_OTHER): Payer: Self-pay | Admitting: Family Medicine

## 2022-11-23 DIAGNOSIS — M25561 Pain in right knee: Secondary | ICD-10-CM | POA: Diagnosis not present

## 2022-11-23 DIAGNOSIS — R262 Difficulty in walking, not elsewhere classified: Secondary | ICD-10-CM | POA: Diagnosis not present

## 2022-11-23 NOTE — Progress Notes (Signed)
Chief Complaint:   OBESITY Eric [name] is here to discuss his progress with his obesity treatment plan along with follow-up of his obesity related diagnoses. Eric [name] is on the Category 3 Plan and states he is following his eating plan approximately 50% of the time. Eric [name] states he is doing physical therapy and walking for 60 minutes 3 times per week.  Today's visit was #: 64 Starting weight: 288 lbs Starting date: 12/14/2017 Today's weight: 208 lbs Today's date: 11/18/2022 Total lbs lost to date: 80 Total lbs lost since last in-office visit: 5  Interim History: Patient is doing well with his weight loss.  His hunger is controlled.  He is doing physical therapy for exercise after his knee replacement.  Subjective:   1. Vitamin D deficiency Patient is stable on vitamin D with no signs of over replacement.  2. Other constipation Patient is having intermittent constipation, and he forgets to take MiraLAX.  Assessment/Plan:   1. Vitamin D deficiency Patient will continue prescription vitamin D once weekly, and we will refill for 90 days.  We will recheck labs in 6 weeks.  - Vitamin D, Ergocalciferol, (DRISDOL) 1.25 MG (50000 UNIT) CAPS capsule; TAKE ONE CAPSULE BY MOUTH EVERY 7 DAYS  Dispense: 12 capsule; Refill: 0  2. Other constipation Patient is to continue to work on taking MiraLAX one half dose daily and increase his water intake.  3. BMI 31.0-31.9,adult  4. Obesity, Beginning BMI 43.79 Eric [name] is currently in the action stage of change. As such, his goal is to continue with weight loss efforts. He has agreed to the Category 3 Plan.   Exercise goals: As is.   Behavioral modification strategies: increasing lean protein intake and no skipping meals.  Eric [name] has agreed to follow-up with our clinic in 4 weeks. He was informed of the importance of frequent follow-up visits to maximize his success with intensive lifestyle modifications for his multiple health conditions.    Objective:   Blood pressure 107/71, pulse 78, temperature 98.2 F (36.8 C), height 5\' 8"  (1.727 m), weight 208 lb (94.3 kg), SpO2 90%. Body mass index is 31.63 kg/m.  Lab Results  Component Value Date   CREATININE 0.90 09/22/2022   BUN 23 09/22/2022   NA 140 09/22/2022   K 4.1 09/22/2022   CL 105 09/22/2022   CO2 28 09/22/2022   Lab Results  Component Value Date   ALT 16 09/09/2022   AST 13 09/09/2022   ALKPHOS 52 09/09/2022   BILITOT 0.8 09/09/2022   Lab Results  Component Value Date   HGBA1C 5.4 09/09/2022   HGBA1C 5.3 03/05/2022   HGBA1C 5.7 09/03/2021   HGBA1C 5.7 03/05/2021   HGBA1C 5.6 09/01/2020   Lab Results  Component Value Date   INSULIN 9.0 10/22/2019   INSULIN 21.3 12/14/2017   Lab Results  Component Value Date   TSH 0.62 09/09/2022   Lab Results  Component Value Date   CHOL 102 09/09/2022   HDL 40.40 09/09/2022   LDLCALC 48 09/09/2022   TRIG 68.0 09/09/2022   CHOLHDL 3 09/09/2022   Lab Results  Component Value Date   VD25OH 77.43 09/09/2022   VD25OH 70.37 03/05/2022   VD25OH 66.83 09/03/2021   Lab Results  Component Value Date   WBC 6.6 09/22/2022   HGB 15.5 09/22/2022   HCT 47.9 09/22/2022   MCV 96.0 09/22/2022   PLT 177 09/22/2022   No results found for: "IRON", "TIBC", "FERRITIN"  Attestation Statements:   Reviewed by  clinician on day of visit: allergies, medications, problem list, medical history, surgical history, family history, social history, and previous encounter notes.   I, Burt Knack, am acting as transcriptionist for Quillian Quince, MD.  I have reviewed the above documentation for accuracy and completeness, and I agree with the above. -  Quillian Quince, MD

## 2022-11-25 DIAGNOSIS — R262 Difficulty in walking, not elsewhere classified: Secondary | ICD-10-CM | POA: Diagnosis not present

## 2022-11-25 DIAGNOSIS — M25561 Pain in right knee: Secondary | ICD-10-CM | POA: Diagnosis not present

## 2022-11-29 DIAGNOSIS — M25561 Pain in right knee: Secondary | ICD-10-CM | POA: Diagnosis not present

## 2022-11-29 DIAGNOSIS — R262 Difficulty in walking, not elsewhere classified: Secondary | ICD-10-CM | POA: Diagnosis not present

## 2022-12-04 ENCOUNTER — Encounter: Payer: Self-pay | Admitting: Family Medicine

## 2022-12-13 ENCOUNTER — Other Ambulatory Visit (HOSPITAL_COMMUNITY): Payer: Self-pay

## 2022-12-21 ENCOUNTER — Ambulatory Visit (INDEPENDENT_AMBULATORY_CARE_PROVIDER_SITE_OTHER): Payer: Medicare Other | Admitting: Family Medicine

## 2022-12-21 ENCOUNTER — Encounter (INDEPENDENT_AMBULATORY_CARE_PROVIDER_SITE_OTHER): Payer: Self-pay | Admitting: Family Medicine

## 2022-12-21 VITALS — BP 106/68 | HR 61 | Temp 97.7°F | Ht 68.0 in | Wt 207.0 lb

## 2022-12-21 DIAGNOSIS — E669 Obesity, unspecified: Secondary | ICD-10-CM | POA: Diagnosis not present

## 2022-12-21 DIAGNOSIS — Z794 Long term (current) use of insulin: Secondary | ICD-10-CM | POA: Diagnosis not present

## 2022-12-21 DIAGNOSIS — E1169 Type 2 diabetes mellitus with other specified complication: Secondary | ICD-10-CM | POA: Diagnosis not present

## 2022-12-21 DIAGNOSIS — I10 Essential (primary) hypertension: Secondary | ICD-10-CM

## 2022-12-21 DIAGNOSIS — Z7984 Long term (current) use of oral hypoglycemic drugs: Secondary | ICD-10-CM | POA: Diagnosis not present

## 2022-12-21 DIAGNOSIS — Z7985 Long-term (current) use of injectable non-insulin antidiabetic drugs: Secondary | ICD-10-CM

## 2022-12-21 DIAGNOSIS — Z6831 Body mass index (BMI) 31.0-31.9, adult: Secondary | ICD-10-CM | POA: Diagnosis not present

## 2022-12-21 MED ORDER — TIRZEPATIDE 7.5 MG/0.5ML ~~LOC~~ SOAJ
7.5000 mg | SUBCUTANEOUS | 0 refills | Status: DC
Start: 2022-12-21 — End: 2023-02-01

## 2022-12-21 MED ORDER — METFORMIN HCL 500 MG PO TABS
500.0000 mg | ORAL_TABLET | Freq: Every day | ORAL | 0 refills | Status: DC
Start: 2022-12-21 — End: 2023-05-30

## 2022-12-21 NOTE — Progress Notes (Signed)
Chief Complaint:   OBESITY Eric [name] is here to discuss his progress with his obesity treatment plan along with follow-up of his obesity related diagnoses. Eric [name] is on the Category 3 Plan and states he is following his eating plan approximately 50% of the time. Eric [name] states he is walking 1/2 mile for 20 minutes 3 times per week.  Today's visit was #: 65 Starting weight: 288 lbs Starting date: 12/14/2017 Today's weight: 207 lbs Today's date: 12/21/2022 Total lbs lost to date: 81 Total lbs lost since last in-office visit: 1  Interim History: Patient continues to work on his diet.  His exercise is more limited due to recovering from knee replacement surgery.  Subjective:   1. Type 2 diabetes mellitus with other specified complication, with long-term current use of insulin (HCC) Patient continues to do well with his weight loss to recovering from knee surgery.  He has no side effects on his medications.  2. Essential hypertension Patient's blood pressure is controlled today.  He denies lightheadedness, and he is working on his diet.  Assessment/Plan:   1. Type 2 diabetes mellitus with other specified complication, with long-term current use of insulin (HCC) Patient will continue his medications, and we will refill Mounjaro 7.5 mg once weekly for 1 month and we will refill metformin 500 mg every morning for 90 days.  2. Essential hypertension Patient will continue with his diet, exercise, and medications and we will follow-up at his next visit in 1 month.  3. BMI 31.0-31.9,adult  4. Obesity, Beginning BMI 43.79 Eric [name] is currently in the action stage of change. As such, his goal is to continue with weight loss efforts. He has agreed to the Category 2 Plan.   Exercise goals: All adults should avoid inactivity. Some physical activity is better than none, and adults who participate in any amount of physical activity gain some health benefits.  Behavioral modification  strategies: increasing lean protein intake.  Eric [name] has agreed to follow-up with our clinic in 4 weeks. He was informed of the importance of frequent follow-up visits to maximize his success with intensive lifestyle modifications for his multiple health conditions.   Objective:   Blood pressure 106/68, pulse 61, temperature 97.7 F (36.5 C), height 5\' 8"  (1.727 m), weight 207 lb (93.9 kg), SpO2 98%. Body mass index is 31.47 kg/m.  Lab Results  Component Value Date   CREATININE 0.90 09/22/2022   BUN 23 09/22/2022   NA 140 09/22/2022   K 4.1 09/22/2022   CL 105 09/22/2022   CO2 28 09/22/2022   Lab Results  Component Value Date   ALT 16 09/09/2022   AST 13 09/09/2022   ALKPHOS 52 09/09/2022   BILITOT 0.8 09/09/2022   Lab Results  Component Value Date   HGBA1C 5.4 09/09/2022   HGBA1C 5.3 03/05/2022   HGBA1C 5.7 09/03/2021   HGBA1C 5.7 03/05/2021   HGBA1C 5.6 09/01/2020   Lab Results  Component Value Date   INSULIN 9.0 10/22/2019   INSULIN 21.3 12/14/2017   Lab Results  Component Value Date   TSH 0.62 09/09/2022   Lab Results  Component Value Date   CHOL 102 09/09/2022   HDL 40.40 09/09/2022   LDLCALC 48 09/09/2022   TRIG 68.0 09/09/2022   CHOLHDL 3 09/09/2022   Lab Results  Component Value Date   VD25OH 77.43 09/09/2022   VD25OH 70.37 03/05/2022   VD25OH 66.83 09/03/2021   Lab Results  Component Value Date   WBC 6.6 09/22/2022  HGB 15.5 09/22/2022   HCT 47.9 09/22/2022   MCV 96.0 09/22/2022   PLT 177 09/22/2022   No results found for: "IRON", "TIBC", "FERRITIN"  Attestation Statements:   Reviewed by clinician on day of visit: allergies, medications, problem list, medical history, surgical history, family history, social history, and previous encounter notes.   I, Burt Knack, am acting as transcriptionist for Quillian Quince, MD.  I have reviewed the above documentation for accuracy and completeness, and I agree with the above. -  Quillian Quince, MD

## 2023-01-10 ENCOUNTER — Other Ambulatory Visit (HOSPITAL_COMMUNITY): Payer: Self-pay

## 2023-01-11 ENCOUNTER — Other Ambulatory Visit: Payer: Self-pay

## 2023-02-01 ENCOUNTER — Encounter (INDEPENDENT_AMBULATORY_CARE_PROVIDER_SITE_OTHER): Payer: Self-pay | Admitting: Family Medicine

## 2023-02-01 ENCOUNTER — Ambulatory Visit (INDEPENDENT_AMBULATORY_CARE_PROVIDER_SITE_OTHER): Payer: Medicare Other | Admitting: Family Medicine

## 2023-02-01 ENCOUNTER — Encounter: Payer: Self-pay | Admitting: Family Medicine

## 2023-02-01 VITALS — BP 112/55 | HR 99 | Temp 97.9°F | Ht 68.0 in | Wt 206.0 lb

## 2023-02-01 DIAGNOSIS — E1169 Type 2 diabetes mellitus with other specified complication: Secondary | ICD-10-CM

## 2023-02-01 DIAGNOSIS — Z794 Long term (current) use of insulin: Secondary | ICD-10-CM

## 2023-02-01 DIAGNOSIS — E669 Obesity, unspecified: Secondary | ICD-10-CM | POA: Diagnosis not present

## 2023-02-01 DIAGNOSIS — Z7985 Long-term (current) use of injectable non-insulin antidiabetic drugs: Secondary | ICD-10-CM

## 2023-02-01 DIAGNOSIS — E559 Vitamin D deficiency, unspecified: Secondary | ICD-10-CM | POA: Diagnosis not present

## 2023-02-01 DIAGNOSIS — N400 Enlarged prostate without lower urinary tract symptoms: Secondary | ICD-10-CM

## 2023-02-01 DIAGNOSIS — Z6831 Body mass index (BMI) 31.0-31.9, adult: Secondary | ICD-10-CM | POA: Diagnosis not present

## 2023-02-01 MED ORDER — TIRZEPATIDE 7.5 MG/0.5ML ~~LOC~~ SOAJ
7.5000 mg | SUBCUTANEOUS | 0 refills | Status: DC
Start: 2023-02-01 — End: 2023-05-23

## 2023-02-01 MED ORDER — VITAMIN D (ERGOCALCIFEROL) 1.25 MG (50000 UNIT) PO CAPS
ORAL_CAPSULE | ORAL | 0 refills | Status: DC
Start: 2023-02-01 — End: 2023-05-23

## 2023-02-01 MED ORDER — TAMSULOSIN HCL 0.4 MG PO CAPS
0.4000 mg | ORAL_CAPSULE | Freq: Every day | ORAL | 1 refills | Status: DC
Start: 2023-02-01 — End: 2023-07-29

## 2023-02-01 NOTE — Progress Notes (Signed)
.smr  Office: [phone]  /  Fax: [phone]  WEIGHT SUMMARY AND BIOMETRICS  Anthropometric Measurements Height: 5\' 8"  (1.727 m) Weight: 206 lb (93.4 kg) BMI (Calculated): 31.33 Weight at Last Visit: 207 lb Weight Lost Since Last Visit: 1 lb Weight Gained Since Last Visit: 0 Starting Weight: 288 lb Total Weight Loss (lbs): 78 lb (35.4 kg)   Body Composition  Body Fat %: 37.4 % Fat Mass (lbs): 77.2 lbs Muscle Mass (lbs): 122.6 lbs Total Body Water (lbs): 94.4 lbs Visceral Fat Rating : 22   Other Clinical Data Fasting: Yes Labs: No Today's Visit #: 25 Starting Date: 12/14/17    Chief Complaint: OBESITY   History of Present Illness   The patient, with a history of type 2 diabetes, vitamin D deficiency, and obesity, presents for a routine follow-up. He reports a weight loss of one pound over the past six weeks and adherence to his category two eating plan approximately 50% of the time. The patient is not currently engaging in any exercise regimen. He is on prescribed vitamin D for his deficiency and Mounjaro for diabetes management.  The patient has been experiencing a lack of motivation and energy, spending most of his time sitting and watching television or playing games on his phone. He reports waking up at 3 AM and having difficulty returning to sleep. He also mentions a recent knee surgery, which is still causing discomfort and has not fully healed after four months. The patient has achieved full extension in the knee this week, but the entire leg remains numb, and the knee area itches. He has been using a cream for relief.  The patient also reports a recent personal loss, which has added to his feelings of sadness and lack of motivation. He expresses concern about his financial situation, particularly in relation to his retirement and recent losses in his investments. Despite these challenges, the patient has managed to maintain his weight and has even lost a pound  since his last visit. He expresses a desire to increase his physical activity, particularly walking, but has been hindered by his knee discomfort and fear of falling on irregular terrain.          PHYSICAL EXAM:  Blood pressure (!) 112/55, pulse 99, temperature 97.9 F (36.6 C), height 5\' 8"  (1.727 m), weight 206 lb (93.4 kg), SpO2 98%. Body mass index is 31.32 kg/m.  DIAGNOSTIC DATA REVIEWED:  BMET    Component Value Date/Time   NA 140 09/22/2022 0902   NA 139 10/22/2019 1243   K 4.1 09/22/2022 0902   CL 105 09/22/2022 0902   CO2 28 09/22/2022 0902   GLUCOSE 97 09/22/2022 0902   BUN 23 09/22/2022 0902   BUN 16 10/22/2019 1243   CREATININE 0.90 09/22/2022 0902   CREATININE 0.93 04/23/2016 1633   CALCIUM 9.4 09/22/2022 0902   GFRNONAA >60 09/22/2022 0902   GFRAA 74 10/22/2019 1243   Lab Results  Component Value Date   HGBA1C 5.4 09/09/2022   HGBA1C 5.5 03/05/2015   Lab Results  Component Value Date   INSULIN 9.0 10/22/2019   INSULIN 21.3 12/14/2017   Lab Results  Component Value Date   TSH 0.62 09/09/2022   CBC    Component Value Date/Time   WBC 6.6 09/22/2022 0902   RBC 4.99 09/22/2022 0902   HGB 15.5 09/22/2022 0902   HGB 15.5 12/14/2017 1030   HCT 47.9 09/22/2022 0902   HCT 45.1 12/14/2017 1030   PLT 177 09/22/2022 0902  MCV 96.0 09/22/2022 0902   MCV 90 12/14/2017 1030   MCH 31.1 09/22/2022 0902   MCHC 32.4 09/22/2022 0902   RDW 13.2 09/22/2022 0902   RDW 13.0 12/14/2017 1030   Iron Studies No results found for: "IRON", "TIBC", "FERRITIN", "IRONPCTSAT" Lipid Panel     Component Value Date/Time   CHOL 102 09/09/2022 0927   CHOL 137 10/22/2019 1243   TRIG 68.0 09/09/2022 0927   HDL 40.40 09/09/2022 0927   HDL 50 10/22/2019 1243   CHOLHDL 3 09/09/2022 0927   VLDL 13.6 09/09/2022 0927   LDLCALC 48 09/09/2022 0927   LDLCALC 73 10/22/2019 1243   Hepatic Function Panel     Component Value Date/Time   PROT 6.3 09/09/2022 0927   PROT 6.5  10/22/2019 1243   ALBUMIN 4.1 09/09/2022 0927   ALBUMIN 4.3 10/22/2019 1243   AST 13 09/09/2022 0927   ALT 16 09/09/2022 0927   ALKPHOS 52 09/09/2022 0927   BILITOT 0.8 09/09/2022 0927   BILITOT 0.7 10/22/2019 1243   BILIDIR 0.2 06/07/2014 0909      Component Value Date/Time   TSH 0.62 09/09/2022 0927   Nutritional Lab Results  Component Value Date   VD25OH 77.43 09/09/2022   VD25OH 70.37 03/05/2022   VD25OH 66.83 09/03/2021     Assessment and Plan    Type 2 Diabetes Mellitus Type 2 diabetes mellitus managed with Mounjaro. Adherence to dietary plan ~50% and no exercise regimen. Weight loss of 1 lb in 6 weeks. No desire to increase medication dosage. Discussed importance of regular exercise and dietary adherence for glycemic control and weight management. - Continue Mounjaro - Encourage dietary adherence - Initiate regular exercise regimen  Obesity Obesity with 82 lb weight loss since initial visit. Current management includes dietary modifications and limited physical activity. Motivation and activity hindered by recent knee surgery and personal stressors. Discussed benefits of regular physical activity, including walking and strengthening exercises, for weight management and overall health. - Continue weight management strategies - Recommend regular physical activity, including walking and strengthening exercises - Discuss core strengthening and balance exercises  Postoperative Knee Pain Postoperative knee pain 4 months post-surgery. Pain severity 2/10, exacerbated by prolonged sitting and certain activities. Swelling with high salt intake and use of compression socks. Full extension achieved, residual numbness and occasional itching managed with topical cream. Discussed gradual increase in physical activity to improve knee function. - Encourage gradual increase in physical activity - Use topical creams for itching as needed  Vitamin D Deficiency Vitamin D deficiency  managed with prescription vitamin D. No new concerns. - Continue vitamin D prescription  Follow-up - Confirm next appointment in 4 weeks - Schedule January appointment if needed.        He was informed of the importance of frequent follow up visits to maximize his success with intensive lifestyle modifications for his multiple health conditions.    Quillian Quince, MD

## 2023-02-14 ENCOUNTER — Encounter: Payer: Self-pay | Admitting: Family Medicine

## 2023-02-15 ENCOUNTER — Ambulatory Visit (INDEPENDENT_AMBULATORY_CARE_PROVIDER_SITE_OTHER): Payer: Medicare Other | Admitting: Family Medicine

## 2023-02-15 ENCOUNTER — Encounter: Payer: Self-pay | Admitting: Family Medicine

## 2023-02-15 VITALS — BP 116/60 | HR 61 | Temp 98.5°F | Resp 12 | Ht 68.0 in | Wt 211.2 lb

## 2023-02-15 DIAGNOSIS — K4090 Unilateral inguinal hernia, without obstruction or gangrene, not specified as recurrent: Secondary | ICD-10-CM | POA: Diagnosis not present

## 2023-02-15 DIAGNOSIS — E785 Hyperlipidemia, unspecified: Secondary | ICD-10-CM

## 2023-02-15 MED ORDER — ATORVASTATIN CALCIUM 40 MG PO TABS
40.0000 mg | ORAL_TABLET | Freq: Every day | ORAL | 1 refills | Status: DC
Start: 2023-02-15 — End: 2023-08-25

## 2023-02-15 NOTE — Progress Notes (Signed)
Established Patient Office Visit  Subjective   Patient ID: Eric [name], male    DOB: 06/06/1945  Age: 77 y.o. MRN: [id number]  Chief Complaint  Patient presents with   HERNIA- GROIN AREA    HPI Discussed the use of AI scribe software for clinical note transcription with the patient, who gave verbal consent to proceed.  History of Present Illness   The patient, with a history of knee surgery, presents with a self-diagnosed hernia. About a month ago, he noticed a soft mass which disappeared upon applying pressure. He experienced no pain at that time. However, after engaging in strenuous activities such as hiking with backpacks, climbing tree stand ladders, and dragging a deer, he started experiencing pain. The pain persisted even at rest but eventually subsided. The patient was able to reduce the hernia, which was not associated with any severe discomfort, just a mild pain. The pain recurred mildly during activities such as blowing leaves. The patient has a history of referring others for hernia surgery and is aware that if the surgeon cannot locate the hernia, he may not operate. He is confident that he has a hernia, but is unsure if it will be detectable upon examination.  In addition to the hernia, the patient mentions that he is four months post-knee surgery. The knee still hurts a little, but he has been able to engage in activities such as climbing trees. He has plans for more hunting trips in the coming months.  The patient also mentions needing refills for his Lipitor and Metformin medications. He is currently getting his prescriptions filled at [facility].      Patient Active Problem List   Diagnosis Date Noted   Osteoarthritis of right knee 10/01/2022   BMI 31.0-31.9,adult 08/19/2022   BMI 34.0-34.9,adult 05/19/2022   DJD (degenerative joint disease) 05/19/2022   BMI 33.0-33.9,adult 04/21/2022   Obesity, Beginning BMI 43.79 04/21/2022   Other constipation 12/21/2021    Diabetes mellitus (HCC) 09/28/2021   Bronchitis 02/03/2021   Depression 04/02/2020   Viral upper respiratory tract infection 08/31/2019   Folliculitis 08/31/2019   Preventative health care 03/02/2019   Morbid obesity (HCC) 03/02/2019   Hypotension 10/11/2018   Status post total hip replacement, right 09/25/2018   Osteoarthritis of right hip 09/21/2018   Prediabetes 08/29/2018   Vitamin D deficiency 08/29/2018   Primary osteoarthritis of right hip 08/29/2018   Thyroid nodule 05/12/2017   Primary osteoarthritis of left hip 03/28/2017   Status post total replacement of left hip 03/28/2017   Osteoarthritis of left hip 03/25/2017   Essential hypertension 11/18/2016   Hyperlipidemia LDL goal <100 11/18/2016   Need for prophylactic vaccination and inoculation against influenza 11/18/2016   Primary localized osteoarthrosis of right shoulder 05/25/2016   History of total replacement of right shoulder joint 05/25/2016   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   S/P shoulder replacement 10/14/2015   Low back pain 09/03/2015   Left foot pain 04/04/2014   Left shoulder pain 02/20/2014   Palpitations 12/25/2012   Left hip pain 12/25/2012   Class 3 severe obesity with serious comorbidity and body mass index (BMI) of 40.0 to 44.9 in adult [facility]) 12/25/2012   Acute bronchitis 12/25/2012   Urgency of urination 12/24/2011   Urinary frequency 12/24/2011   Preop examination 10/08/2010   Hyperlipidemia 10/08/2010   Hearing loss 09/04/2009   Internal hemorrhoids 09/04/2009   ALLERGIC RHINITIS, SEASONAL 09/04/2009   GERD 09/04/2009   Diverticulosis of colon 09/04/2009  Osteoarthritis 09/04/2009   Sleep apnea 09/04/2009   Past Medical History:  Diagnosis Date   Alcohol abuse    Allergy    Back pain    Cancer (HCC)    Basal cell cancer   Cataract    forming   Colon polyps    Diverticulosis of colon    DJD (degenerative joint disease)    Dysrhythmia    Palpations from  caffiene- reports he was having PACs due to slim fast wth caffeine - had full cardiac w/u and was told to cut back on caffeine;  now drink decaf bevrerages and no rpeort of recurrence    Enlarged prostate    GERD (gastroesophageal reflux disease)    Hearing loss    Hip osteoarthritis    Left   History of hay fever    Hx of cardiovascular stress test    Lexiscan Myoview (04/2013):  No ischemia, EF 61%; low risk   Hyperlipemia    Hypertension    Insulin resistance    om metfromin    Internal hemorrhoids without mention of complication    Leg edema    Neuroma of foot    left   Obesity    Osteoarthrosis, unspecified whether generalized or localized, unspecified site    Palpitations    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   Primary localized osteoarthrosis of right shoulder 05/25/2016   Recent weight loss    75lbs   Seasonal allergies    Sleep apnea    CPAP   Thyroid nodule    calcified thyroid nodules- bx negative    Tremor of both hands    Past Surgical History:  Procedure Laterality Date   COLONOSCOPY     JOINT REPLACEMENT  2018   POLYPECTOMY     SHOULDER SURGERY  2009   right   TONSILLECTOMY     TOTAL HIP ARTHROPLASTY Left 03/28/2017   Procedure: TOTAL HIP ARTHROPLASTY ANTERIOR APPROACH;  Surgeon: Gean Birchwood, MD;  Location: [facility] OR;  Service: Orthopedics;  Laterality: Left;   TOTAL HIP ARTHROPLASTY Right 09/25/2018   Procedure: Right Anterior Hip Arthroplasty;  Surgeon: Gean Birchwood, MD;  Location: [facility];  Service: Orthopedics;  Laterality: Right;   TOTAL KNEE ARTHROPLASTY Right 10/04/2022   Procedure: RIGHT TOTAL KNEE ARTHROPLASTY;  Surgeon: Gean Birchwood, MD;  Location: [facility];  Service: Orthopedics;  Laterality: Right;   TOTAL SHOULDER ARTHROPLASTY Left 10/14/2015   Procedure: LEFT TOTAL SHOULDER ARTHROPLASTY;  Surgeon: Teryl Lucy, MD;  Location: [facility] OR;  Service: Orthopedics;  Laterality: Left;   TOTAL SHOULDER ARTHROPLASTY Right 05/25/2016    Procedure: TOTAL SHOULDER ARTHROPLASTY;  Surgeon: Teryl Lucy, MD;  Location: [facility] OR;  Service: Orthopedics;  Laterality: Right;   Social History   Tobacco Use   Smoking status: Never   Smokeless tobacco: Never  Vaping Use   Vaping status: Never Used  Substance Use Topics   Alcohol use: Not Currently    Alcohol/week: 3.0 standard drinks of alcohol    Types: 1 Glasses of wine, 2 Cans of beer per week    Comment: rare----12 a year   Drug use: No   Social History   Socioeconomic History   Marital status: Married    Spouse name: Tramarion Mcpheeters    Number of children: 3   Years of education: Not on file   Highest education level: Bachelor's degree (e.g., BA, AB, BS)  Occupational History   Occupation: Retired--[employer], [employer]  Tobacco Use   Smoking status: Never  Smokeless tobacco: Never  Vaping Use   Vaping status: Never Used  Substance and Sexual Activity   Alcohol use: Not Currently    Alcohol/week: 3.0 standard drinks of alcohol    Types: 1 Glasses of wine, 2 Cans of beer per week    Comment: rare----12 a year   Drug use: No   Sexual activity: Yes    Partners: Female  Other Topics Concern   Not on file  Social History Narrative   Lives with wife and daughter and two grandchildren.    Social Determinants of Health   Financial Resource Strain: Low Risk  (02/14/2023)   Overall Financial Resource Strain (CARDIA)    Difficulty of Paying Living Expenses: Not hard at all  Food Insecurity: No Food Insecurity (02/14/2023)   Hunger Vital Sign    Worried About Running Out of Food in the Last Year: Never true    Ran Out of Food in the Last Year: Never true  Transportation Needs: No Transportation Needs (02/14/2023)   PRAPARE - Administrator, Civil Service (Medical): No    Lack of Transportation (Non-Medical): No  Physical Activity: Insufficiently Active (02/14/2023)   Exercise Vital Sign    Days of Exercise per Week: 2 days    Minutes of Exercise  per Session: 30 min  Stress: No Stress Concern Present (02/14/2023)   [name] of Occupational Health - Occupational Stress Questionnaire    Feeling of Stress : Not at all  Social Connections: Socially Integrated (02/14/2023)   Social Connection and Isolation Panel [NHANES]    Frequency of Communication with Friends and Family: More than three times a week    Frequency of Social Gatherings with Friends and Family: Once a week    Attends Religious Services: More than 4 times per year    Active Member of [employer] or Organizations: No    Attends Engineer, structural: More than 4 times per year    Marital Status: Married  Catering manager Violence: Not At Risk (08/05/2021)   Humiliation, Afraid, Rape, and Kick questionnaire    Fear of Current or Ex-Partner: No    Emotionally Abused: No    Physically Abused: No    Sexually Abused: No   Family Status  Relation Name Status   Mother  Deceased   Father  Deceased at age 53       heart   Sister  Alive   Brother  Alive   PGF  (Not Specified)   Other  (Not Specified)   Neg Hx  (Not Specified)  No partnership data on file   Family History  Problem Relation Age of Onset   Arthritis Mother    Cancer Mother 8       breast   Hyperlipidemia Mother    Colon polyps Mother 78   Hypertension Mother    Heart disease Mother    Depression Mother    Anxiety disorder Mother    Obesity Mother    Breast cancer Mother    Heart disease Father        CAD--passed away Feb 29, 2012 age 38   Hyperlipidemia Father    Hypertension Father    Obesity Father    Diabetes Father    Depression Father    Sleep apnea Father    Cancer Sister 39       breast   Breast cancer Sister    Sleep apnea Sister    Cancer Brother 73  prostate   Prostate cancer Brother    Atrial fibrillation Brother    Tremor Paternal Grandfather    Breast cancer Other    Prostate cancer Other    Colon cancer Neg Hx    Esophageal cancer Neg Hx    Rectal cancer Neg  Hx    Stomach cancer Neg Hx    Allergies  Allergen Reactions   Lisinopril Cough      ROS    Objective:     BP 116/60 (BP Location: Left Arm, Cuff Size: Normal)   Pulse 61   Temp 98.5 F (36.9 C) (Oral)   Resp 12   Ht 5\' 8"  (1.727 m)   Wt 211 lb 3.2 oz (95.8 kg)   SpO2 95%   BMI 32.11 kg/m  BP Readings from Last 3 Encounters:  02/15/23 116/60  02/01/23 (!) 112/55  12/21/22 106/68   Wt Readings from Last 3 Encounters:  02/15/23 211 lb 3.2 oz (95.8 kg)  02/01/23 206 lb (93.4 kg)  12/21/22 207 lb (93.9 kg)   SpO2 Readings from Last 3 Encounters:  02/15/23 95%  02/01/23 98%  12/21/22 98%      Physical Exam   No results found for any visits on 02/15/23.  Last CBC Lab Results  Component Value Date   WBC 6.6 09/22/2022   HGB 15.5 09/22/2022   HCT 47.9 09/22/2022   MCV 96.0 09/22/2022   MCH 31.1 09/22/2022   RDW 13.2 09/22/2022   PLT 177 09/22/2022   Last metabolic panel Lab Results  Component Value Date   GLUCOSE 97 09/22/2022   NA 140 09/22/2022   K 4.1 09/22/2022   CL 105 09/22/2022   CO2 28 09/22/2022   BUN 23 09/22/2022   CREATININE 0.90 09/22/2022   GFRNONAA >60 09/22/2022   CALCIUM 9.4 09/22/2022   PROT 6.3 09/09/2022   ALBUMIN 4.1 09/09/2022   LABGLOB 2.2 10/22/2019   AGRATIO 2.0 10/22/2019   BILITOT 0.8 09/09/2022   ALKPHOS 52 09/09/2022   AST 13 09/09/2022   ALT 16 09/09/2022   ANIONGAP 7 09/22/2022   Last lipids Lab Results  Component Value Date   CHOL 102 09/09/2022   HDL 40.40 09/09/2022   LDLCALC 48 09/09/2022   TRIG 68.0 09/09/2022   CHOLHDL 3 09/09/2022   Last hemoglobin A1c Lab Results  Component Value Date   HGBA1C 5.4 09/09/2022   Last thyroid functions Lab Results  Component Value Date   TSH 0.62 09/09/2022   T3TOTAL 127 12/14/2017   T4TOTAL 9.2 03/05/2022   Last vitamin D Lab Results  Component Value Date   VD25OH 77.43 09/09/2022   Last vitamin B12 and Folate Lab Results  Component Value Date    VITAMINB12 385 03/03/2020      The ASCVD Risk score (Arnett DK, et al., 2019) failed to calculate for the following reasons:   The valid total cholesterol range is 130 to 320 mg/dL    Assessment & Plan:   Problem List Items Addressed This Visit       Unprioritized   Hyperlipidemia   Relevant Medications   atorvastatin (LIPITOR) 40 MG tablet   Other Visit Diagnoses     Non-recurrent unilateral inguinal hernia without obstruction or gangrene    -  Primary   Relevant Orders   Ambulatory referral to General Surgery     Assessment and Plan    Inguinal Hernia   He presents with an intermittent soft mass in the inguinal region, now painful during  physical activities and manually reducible. A physical exam reveals a small bulge upon coughing, consistent with an inguinal hernia. A CT scan may be considered for confirmation if not palpable during surgical evaluation. Surgery, the definitive treatment, carries risks including infection, recurrence, and chronic pain but offers benefits like symptom relief and prevention of complications such as incarceration or strangulation. Given his active lifestyle and symptoms, we recommend a surgical evaluation by referring him to [facility] and advise avoiding heavy lifting and strenuous activities until the evaluation.  Postoperative Knee Pain   He is four months post knee surgery and reports residual pain, possibly exacerbated by his active lifestyle. Continued activity, as tolerated, is beneficial for recovery, but he should monitor for worsening pain or new symptoms. If pain persists or worsens, consider a follow-up with an orthopedic surgeon.  Hyperlipidemia   He requires a refill of atorvastatin for hyperlipidemia management as no current refills are available. We will prescribe atorvastatin.  Type 2 Diabetes Mellitus   He requires metformin for type 2 diabetes mellitus management and will contact his other healthcare provider for  this medication. We advise him to contact his other healthcare provider for the metformin prescription.  Follow-up   Schedule a follow-up appointment for March 11, 2023.        No follow-ups on file.    Donato Schultz, DO

## 2023-02-17 ENCOUNTER — Other Ambulatory Visit (INDEPENDENT_AMBULATORY_CARE_PROVIDER_SITE_OTHER): Payer: Self-pay | Admitting: Family Medicine

## 2023-02-17 DIAGNOSIS — E1169 Type 2 diabetes mellitus with other specified complication: Secondary | ICD-10-CM

## 2023-02-21 ENCOUNTER — Other Ambulatory Visit (HOSPITAL_COMMUNITY): Payer: Self-pay

## 2023-02-21 ENCOUNTER — Encounter (INDEPENDENT_AMBULATORY_CARE_PROVIDER_SITE_OTHER): Payer: Self-pay | Admitting: Family Medicine

## 2023-02-21 MED ORDER — MOUNJARO 7.5 MG/0.5ML ~~LOC~~ SOAJ
7.5000 mg | SUBCUTANEOUS | 0 refills | Status: DC
  Filled 2023-02-21: qty 2, 28d supply, fill #0

## 2023-02-22 ENCOUNTER — Other Ambulatory Visit (HOSPITAL_COMMUNITY): Payer: Self-pay

## 2023-02-22 DIAGNOSIS — L821 Other seborrheic keratosis: Secondary | ICD-10-CM | POA: Diagnosis not present

## 2023-02-22 DIAGNOSIS — L814 Other melanin hyperpigmentation: Secondary | ICD-10-CM | POA: Diagnosis not present

## 2023-02-22 DIAGNOSIS — L570 Actinic keratosis: Secondary | ICD-10-CM | POA: Diagnosis not present

## 2023-02-22 DIAGNOSIS — Z85828 Personal history of other malignant neoplasm of skin: Secondary | ICD-10-CM | POA: Diagnosis not present

## 2023-02-22 DIAGNOSIS — D1801 Hemangioma of skin and subcutaneous tissue: Secondary | ICD-10-CM | POA: Diagnosis not present

## 2023-02-23 ENCOUNTER — Other Ambulatory Visit: Payer: Self-pay

## 2023-02-23 ENCOUNTER — Other Ambulatory Visit (HOSPITAL_COMMUNITY): Payer: Self-pay

## 2023-02-24 ENCOUNTER — Encounter: Payer: Self-pay | Admitting: Family Medicine

## 2023-02-24 DIAGNOSIS — G4730 Sleep apnea, unspecified: Secondary | ICD-10-CM | POA: Diagnosis not present

## 2023-02-24 DIAGNOSIS — R7303 Prediabetes: Secondary | ICD-10-CM | POA: Diagnosis not present

## 2023-02-24 DIAGNOSIS — K4090 Unilateral inguinal hernia, without obstruction or gangrene, not specified as recurrent: Secondary | ICD-10-CM | POA: Diagnosis not present

## 2023-02-28 ENCOUNTER — Ambulatory Visit: Payer: Self-pay | Admitting: General Surgery

## 2023-02-28 NOTE — Progress Notes (Signed)
Sent message, via epic in basket, requesting orders in epic from surgeon.  

## 2023-03-01 ENCOUNTER — Ambulatory Visit (INDEPENDENT_AMBULATORY_CARE_PROVIDER_SITE_OTHER): Payer: Medicare Other | Admitting: Family Medicine

## 2023-03-01 ENCOUNTER — Encounter (INDEPENDENT_AMBULATORY_CARE_PROVIDER_SITE_OTHER): Payer: Self-pay | Admitting: Family Medicine

## 2023-03-01 VITALS — BP 91/56 | HR 55 | Temp 98.0°F | Ht 68.0 in | Wt 206.0 lb

## 2023-03-01 DIAGNOSIS — E669 Obesity, unspecified: Secondary | ICD-10-CM

## 2023-03-01 DIAGNOSIS — Z7984 Long term (current) use of oral hypoglycemic drugs: Secondary | ICD-10-CM

## 2023-03-01 DIAGNOSIS — E1169 Type 2 diabetes mellitus with other specified complication: Secondary | ICD-10-CM

## 2023-03-01 DIAGNOSIS — Z794 Long term (current) use of insulin: Secondary | ICD-10-CM

## 2023-03-01 DIAGNOSIS — Z7985 Long-term (current) use of injectable non-insulin antidiabetic drugs: Secondary | ICD-10-CM | POA: Diagnosis not present

## 2023-03-01 DIAGNOSIS — Z6831 Body mass index (BMI) 31.0-31.9, adult: Secondary | ICD-10-CM

## 2023-03-01 DIAGNOSIS — K4090 Unilateral inguinal hernia, without obstruction or gangrene, not specified as recurrent: Secondary | ICD-10-CM

## 2023-03-01 DIAGNOSIS — E559 Vitamin D deficiency, unspecified: Secondary | ICD-10-CM

## 2023-03-01 DIAGNOSIS — N400 Enlarged prostate without lower urinary tract symptoms: Secondary | ICD-10-CM

## 2023-03-01 NOTE — Progress Notes (Signed)
.smr  Office: [phone]  /  Fax: [phone]  WEIGHT SUMMARY AND BIOMETRICS  Anthropometric Measurements Height: 5\' 8"  (1.727 m) Weight: 206 lb (93.4 kg) BMI (Calculated): 31.33 Weight at Last Visit: 206 lb Weight Lost Since Last Visit: 0 Weight Gained Since Last Visit: 0 Starting Weight: 288 lb Total Weight Loss (lbs): 82 lb (37.2 kg) Peak Weight: 288 lb   Body Composition  Body Fat %: 37.5 % Fat Mass (lbs): 77.4 lbs Muscle Mass (lbs): 122.6 lbs Total Body Water (lbs): 94.6 lbs Visceral Fat Rating : 22   Other Clinical Data Fasting: yes Labs: no Today's Visit #: 4 Starting Date: 12/14/17    Chief Complaint: OBESITY  History of Present Illness   The patient, with a history of obesity and type 2 diabetes, has been managing his conditions with metformin, Mounjaro, diet, and exercise. He reports maintaining his weight since the last visit and adhering to his category 3 eating plan about 50% of the time. He has been active, engaging in yard work and hunting for about 60 minutes three times a week.  About a month ago, the patient noticed a lump in his groin, which he initially dismissed as a non-bothersome hernia. However, after experiencing discomfort while doing yard work, he sought medical attention. A surgeon confirmed the presence of a hernia and scheduled a preoperative appointment.  The patient also reported a minor issue with his metformin prescription, which was resolved without needing a new prescription.          PHYSICAL EXAM:  Blood pressure (!) 91/56, pulse (!) 55, temperature 98 F (36.7 C), height 5\' 8"  (1.727 m), weight 206 lb (93.4 kg), SpO2 96%. Body mass index is 31.32 kg/m.  DIAGNOSTIC DATA REVIEWED:  BMET    Component Value Date/Time   NA 140 09/22/2022 0902   NA 139 10/22/2019 1243   K 4.1 09/22/2022 0902   CL 105 09/22/2022 0902   CO2 28 09/22/2022 0902   GLUCOSE 97 09/22/2022 0902   BUN 23 09/22/2022 0902   BUN 16 10/22/2019  1243   CREATININE 0.90 09/22/2022 0902   CREATININE 0.93 04/23/2016 1633   CALCIUM 9.4 09/22/2022 0902   GFRNONAA >60 09/22/2022 0902   GFRAA 74 10/22/2019 1243   Lab Results  Component Value Date   HGBA1C 5.4 09/09/2022   HGBA1C 5.5 03/05/2015   Lab Results  Component Value Date   INSULIN 9.0 10/22/2019   INSULIN 21.3 12/14/2017   Lab Results  Component Value Date   TSH 0.62 09/09/2022   CBC    Component Value Date/Time   WBC 6.6 09/22/2022 0902   RBC 4.99 09/22/2022 0902   HGB 15.5 09/22/2022 0902   HGB 15.5 12/14/2017 1030   HCT 47.9 09/22/2022 0902   HCT 45.1 12/14/2017 1030   PLT 177 09/22/2022 0902   MCV 96.0 09/22/2022 0902   MCV 90 12/14/2017 1030   MCH 31.1 09/22/2022 0902   MCHC 32.4 09/22/2022 0902   RDW 13.2 09/22/2022 0902   RDW 13.0 12/14/2017 1030   Iron Studies No results found for: "IRON", "TIBC", "FERRITIN", "IRONPCTSAT" Lipid Panel     Component Value Date/Time   CHOL 102 09/09/2022 0927   CHOL 137 10/22/2019 1243   TRIG 68.0 09/09/2022 0927   HDL 40.40 09/09/2022 0927   HDL 50 10/22/2019 1243   CHOLHDL 3 09/09/2022 0927   VLDL 13.6 09/09/2022 0927   LDLCALC 48 09/09/2022 0927   LDLCALC 73 10/22/2019 1243   Hepatic Function  Panel     Component Value Date/Time   PROT 6.3 09/09/2022 0927   PROT 6.5 10/22/2019 1243   ALBUMIN 4.1 09/09/2022 0927   ALBUMIN 4.3 10/22/2019 1243   AST 13 09/09/2022 0927   ALT 16 09/09/2022 0927   ALKPHOS 52 09/09/2022 0927   BILITOT 0.8 09/09/2022 0927   BILITOT 0.7 10/22/2019 1243   BILIDIR 0.2 06/07/2014 0909      Component Value Date/Time   TSH 0.62 09/09/2022 0927   Nutritional Lab Results  Component Value Date   VD25OH 77.43 09/09/2022   VD25OH 70.37 03/05/2022   VD25OH 66.83 09/03/2021     Assessment and Plan    Inguinal Hernia Left-sided inguinal hernia causing intermittent pain, especially during physical activities. Scheduled for surgery on March 29, 2023. Small tear not expected  to strangulate or incarcerate. Surgery recommended to prevent future complications. Benefits include higher likelihood of successful repair and better healing due to current weight loss and nutritional status. - Proceed with scheduled surgery on March 29, 2023 - Ensure preoperative labs are completed, including A1c  Type 2 Diabetes Mellitus On metformin and Mounjaro. Advised to stop Mounjaro one week before surgery due to its effect on gastric emptying, which could increase the risk of aspiration. Adjust Mounjaro administration day by one day each week until surgery to ensure it is mostly in the system while meeting surgical criteria. - Take Mounjaro on Monday, March 21, 2023, before surgery - Resume Greggory Keen on Wednesday, March 30, 2023, post-surgery - Adjust Mounjaro administration day by one day each week until surgery  Obesity Actively working on weight loss through diet and exercise. Maintained weight over the last month. Following category 3 eating plan 50% of the time. Engaging in physical activity, including yard work and hunting, for an average of 60 minutes three times per week. - Continue current diet and exercise regimen  General Health Maintenance Routine health maintenance and preoperative labs required. - Order CMP, cholesterol, vitamin D, B12, A1c, insulin, and PSA labs  Follow-up - Follow up with Dr. Laury Axon on March 11, 2023 - Attend preoperative appointment on March 07, 2023.        He was informed of the importance of frequent follow up visits to maximize his success with intensive lifestyle modifications for his multiple health conditions.    Eric Quince, MD

## 2023-03-02 ENCOUNTER — Encounter: Payer: Self-pay | Admitting: Family Medicine

## 2023-03-02 LAB — CMP14+EGFR
ALT: 15 [IU]/L (ref 0–44)
AST: 15 [IU]/L (ref 0–40)
Albumin: 4.3 g/dL (ref 3.8–4.8)
Alkaline Phosphatase: 63 [IU]/L (ref 44–121)
BUN/Creatinine Ratio: 20 (ref 10–24)
BUN: 21 mg/dL (ref 8–27)
Bilirubin Total: 0.9 mg/dL (ref 0.0–1.2)
CO2: 24 mmol/L (ref 20–29)
Calcium: 9.2 mg/dL (ref 8.6–10.2)
Chloride: 102 mmol/L (ref 96–106)
Creatinine, Ser: 1.06 mg/dL (ref 0.76–1.27)
Globulin, Total: 2.1 g/dL (ref 1.5–4.5)
Glucose: 77 mg/dL (ref 70–99)
Potassium: 4.6 mmol/L (ref 3.5–5.2)
Sodium: 141 mmol/L (ref 134–144)
Total Protein: 6.4 g/dL (ref 6.0–8.5)
eGFR: 73 mL/min/{1.73_m2} (ref >59)

## 2023-03-02 LAB — CBC WITH DIFFERENTIAL/PLATELET
Basophils Absolute: 0 10*3/uL (ref 0.0–0.2)
Basos: 1 %
EOS (ABSOLUTE): 0 10*3/uL (ref 0.0–0.4)
Eos: 0 %
Hematocrit: 46.3 % (ref 37.5–51.0)
Hemoglobin: 15.1 g/dL (ref 13.0–17.7)
Immature Grans (Abs): 0 10*3/uL (ref 0.0–0.1)
Immature Granulocytes: 0 %
Lymphocytes Absolute: 1.3 10*3/uL (ref 0.7–3.1)
Lymphs: 21 %
MCH: 31.1 pg (ref 26.6–33.0)
MCHC: 32.6 g/dL (ref 31.5–35.7)
MCV: 95 fL (ref 79–97)
Monocytes Absolute: 0.6 10*3/uL (ref 0.1–0.9)
Monocytes: 9 %
Neutrophils Absolute: 4.2 10*3/uL (ref 1.4–7.0)
Neutrophils: 69 %
Platelets: 191 10*3/uL (ref 150–450)
RBC: 4.86 x10E6/uL (ref 4.14–5.80)
RDW: 13.2 % (ref 11.6–15.4)
WBC: 6.1 10*3/uL (ref 3.4–10.8)

## 2023-03-02 LAB — HEMOGLOBIN A1C
Est. average glucose Bld gHb Est-mCnc: 105 mg/dL
Hgb A1c MFr Bld: 5.3 % (ref 4.8–5.6)

## 2023-03-02 LAB — PSA: Prostate Specific Ag, Serum: 1.4 ng/mL (ref 0.0–4.0)

## 2023-03-02 LAB — LIPID PANEL WITH LDL/HDL RATIO
Cholesterol, Total: 120 mg/dL (ref 100–199)
HDL: 50 mg/dL (ref >39)
LDL Chol Calc (NIH): 57 mg/dL (ref 0–99)
LDL/HDL Ratio: 1.1 {ratio} (ref 0.0–3.6)
Triglycerides: 61 mg/dL (ref 0–149)
VLDL Cholesterol Cal: 13 mg/dL (ref 5–40)

## 2023-03-02 LAB — VITAMIN B12: Vitamin B-12: 316 pg/mL (ref 232–1245)

## 2023-03-02 LAB — VITAMIN D 25 HYDROXY (VIT D DEFICIENCY, FRACTURES): Vit D, 25-Hydroxy: 67.8 ng/mL (ref 30.0–100.0)

## 2023-03-02 LAB — INSULIN, RANDOM: INSULIN: 4.5 u[IU]/mL (ref 2.6–24.9)

## 2023-03-06 ENCOUNTER — Other Ambulatory Visit: Payer: Self-pay

## 2023-03-06 ENCOUNTER — Encounter (HOSPITAL_COMMUNITY): Payer: Self-pay

## 2023-03-06 NOTE — Progress Notes (Addendum)
The patient was identified using 2 approved identifiers. All issues noted in this document were discussed and addressed, [name] voiced understanding and agreement with all preoperative instructions. The patient was emailed the surgery instructions per his request.      Medication Reconcilation: 03-02-2023     The patient was instructed to call the Admitting Office [phone] or [phone]) to complete their Pre-surgical Interview.    COVID Vaccine received:  []  No [x]  Yes Date of any COVID positive Test in last 90 days: None  PCP - Eric Spates, DO  Health weight [facility] Wellness- Eric Quince, MD  Cardiologist - None  Chest x-ray - 09-22-2022   2v  Epic EKG - 08-04-2022  Epic  Stress Test - Lexiscan  2015  Epic ECHO - 01-01-2013 Epic Cardiac Cath -   PCR screen: []  Ordered & Completed []   No Order but Needs PROFEND     [x]   N/A for this surgery  Surgery Plan:  [x]  Ambulatory   []  Outpatient in bed  []  Admit Anesthesia:    [x]  General  []  Spinal  []   Choice []   MAC Bowel Prep - [x]  No  []   Yes ______  Pacemaker / ICD device [x]  No []  Yes   Spinal Cord Stimulator:[x]  No []  Yes       History of Sleep Apnea? []  No [x]  Yes   CPAP used?- []  No [x]  Yes    Does the patient monitor blood sugar?   [x]  N/A   []  No []  Yes  Patient has: []  NO Hx DM   [x]  Pre-DM   []  DM1  []   DM2 Last A1c was: 5.3  on  03-01-2023     GLP1 agonist / usual dose - Mounjaro-  Last injection 03-21-23   patient is aware   Diabetic medications/ instructions: Metformin   Hold DOS  Blood Thinner / Instructions:  none Aspirin Instructions:  ASA 81 mg  Hold 5-7 days  ERAS Protocol Ordered: []  No  [x]  Yes PRE-SURGERY []  ENSURE  []  G2   [x]  No Drink Ordered Patient is to be NPO after: 0930  Dental hx: []  Dentures:  [x]  N/A      []  Bridge or Partial:                   []  Loose or Damaged teeth:   Comments: Patient is a retired Surveyor, mining. Retired from [employer]  Activity level: Patient is able  to climb a flight of stairs without difficulty; [x]  No CP  [x]  No SOB.  Patient can perform ADLs without assistance.   Anesthesia review: OSA-CPAP, PRE-DM, HTN, HOH- HAs ,GERD   Patient denies shortness of breath, fever, cough and chest pain at PAT appointment.  Patient verbalized understanding and agreement to the Pre-Surgical Instructions that were given to them at this PAT appointment. Patient was also educated of the need to review these PAT instructions again prior to his surgery.I reviewed the appropriate phone numbers to call if they have any and questions or concerns.

## 2023-03-06 NOTE — Patient Instructions (Signed)
SURGICAL WAITING ROOM VISITATION Patients having surgery or a procedure may have no more than 2 support people in the waiting area - these visitors may rotate in the visitor waiting room.   Due to an increase in RSV and influenza rates and associated hospitalizations, children ages 27 and under may not visit patients in [facility] hospitals. If the patient needs to stay at the hospital during part of their recovery, the visitor guidelines for inpatient rooms apply.  PRE-OP VISITATION  Pre-op nurse will coordinate an appropriate time for 1 support person to accompany the patient in pre-op.  This support person may not rotate.  This visitor will be contacted when the time is appropriate for the visitor to come back in the pre-op area.  Please refer to the [facility] website for the visitor guidelines for Inpatients (after your surgery is over and you are in a regular room).  You are not required to quarantine at this time prior to your surgery. However, you must do this: Hand Hygiene often Do NOT share personal items Notify your provider if you are in close contact with someone who has COVID or you develop fever 100.4 or greater, new onset of sneezing, cough, sore throat, shortness of breath or body aches.  If you test positive for Covid or have been in contact with anyone that has tested positive in the last 10 days please notify you surgeon.    Your procedure is scheduled on:  TUESDAY  March 29, 2023  Report to [facility] Main Entrance: [address] entrance where the [employer] is available.   Report to admitting at:   10:15  AM  Call this number if you have any questions or problems the morning of surgery [phone]  Do not eat food after Midnight the night prior to your surgery/procedure.  After Midnight you may have the following liquids until   09:30 AM DAY OF SURGERY  Clear Liquid Diet Water Black Coffee (sugar ok, NO MILK/CREAM OR CREAMERS)  Tea (sugar ok, NO  MILK/CREAM OR CREAMERS) regular and decaf                             Plain Jell-O  with no fruit (NO RED)                                           Fruit ices (not with fruit pulp, NO RED)                                     Popsicles (NO RED)                                                                  Juice: NO CITRUS JUICES: only apple, WHITE grape, WHITE cranberry Sports drinks like Gatorade or Powerade (NO RED)              FOLLOW ANY ADDITIONAL PRE OP INSTRUCTIONS YOU RECEIVED FROM YOUR SURGEON'S OFFICE!!! Have a clear liquid or light diet the day before your surgery  Oral Hygiene is also important to reduce your risk of infection.        Remember - BRUSH YOUR TEETH THE MORNING OF SURGERY WITH YOUR REGULAR TOOTHPASTE  Do NOT smoke after Midnight the night before surgery.  STOP TAKING all Vitamins, Herbs and supplements 1 week before  your surgery.   [facility]-  Last injection will be taken on Monday 03-21-2023.   METFORMIN- You may take Metformin the day BEFORE surgery. DO NOT take on the morning of your surgery.    Take ONLY these medicines the morning of surgery with A SIP OF WATER: loratadine (Claritin), Pantoprazole (Protonix)  You may use your Eye drops and Flonase nasal spray if needed.  If You have been diagnosed with Sleep Apnea - Bring CPAP mask and tubing day of surgery. We will provide you with a CPAP machine on the day of your surgery.                   You may not have any metal on your body including  jewelry, and body piercing  Do not wear  lotions, powders, cologne, or deodorant  Men may shave face and neck.  Contacts, Hearing Aids, dentures or bridgework may not be worn into surgery. DENTURES WILL BE REMOVED PRIOR TO SURGERY PLEASE DO NOT APPLY "Poly grip" OR ADHESIVES!!!  Patients discharged on the day of surgery will not be allowed to drive home.  Someone NEEDS to stay with you for the first 24 hours after anesthesia.  Do not bring your home  medications to the hospital. The Pharmacy will dispense medications listed on your medication list to you during your admission in the Hospital.  Special Instructions: Bring a copy of your healthcare power of attorney and living will documents the day of surgery, if you wish to have them scanned into your [facility] Records- EPIC  Please read over the following fact sheets you were given: IF YOU HAVE QUESTIONS ABOUT YOUR PRE-OP INSTRUCTIONS, PLEASE CALL [phone]   Rudean Haskell, RN    [facility] - Preparing for Surgery Before surgery, you can play an important role.  Because skin is not sterile, your skin needs to be as free of germs as possible.  You can reduce the number of germs on your skin by washing with Antibacterial soap before surgery.  .  Do not shave (including legs and underarms) for at least 48 hours prior to the first shower.  You may shave your face/neck.  Please follow these instructions carefully:  1.  Shower with antibacterial Soap the night before surgery and the  morning of surgery.  2.  If you choose to wash your hair, wash your hair first as usual with your normal  shampoo.  3.  After you shampoo, rinse your hair and body thoroughly to remove the shampoo.                             4.  You can apply soap directly to the skin and wash.  Gently with a scrungie or clean washcloth.  5.  Wash face,  Genitals (private parts) with your normal soap.             6.  Wash thoroughly, paying special attention to the area where your  surgery  will be performed.  7.  Thoroughly rinse your body with warm water from the neck down.  8.   Pat yourself dry with a  clean towel.             9  Wear clean pajamas.            10 Place clean sheets on your bed the night of your first shower and do not  sleep with pets. ON THE DAY OF SURGERY : Do not apply any lotions/deodorants the morning of surgery.  Please wear clean clothes to the hospital/surgery center.   FAILURE TO  FOLLOW THESE INSTRUCTIONS MAY RESULT IN THE CANCELLATION OF YOUR SURGERY  PATIENT SIGNATURE_________________________________  NURSE SIGNATURE__________________________________  ________________________________________________________________________

## 2023-03-07 ENCOUNTER — Encounter (HOSPITAL_COMMUNITY)
Admission: RE | Admit: 2023-03-07 | Discharge: 2023-03-07 | Disposition: A | Discharge status: Home / Self Care | Payer: Medicare Other | Source: Ambulatory Visit | Attending: Family Medicine | Admitting: Family Medicine

## 2023-03-07 HISTORY — DX: Pneumonia, unspecified organism: J18.9

## 2023-03-07 HISTORY — DX: Prediabetes: R73.03

## 2023-03-11 ENCOUNTER — Encounter (INDEPENDENT_AMBULATORY_CARE_PROVIDER_SITE_OTHER): Payer: Self-pay | Admitting: Family Medicine

## 2023-03-11 ENCOUNTER — Ambulatory Visit (INDEPENDENT_AMBULATORY_CARE_PROVIDER_SITE_OTHER): Payer: Medicare Other | Admitting: Family Medicine

## 2023-03-11 ENCOUNTER — Encounter: Payer: Self-pay | Admitting: Family Medicine

## 2023-03-11 VITALS — BP 108/68 | HR 68 | Temp 97.8°F | Resp 18 | Ht 68.0 in | Wt 216.0 lb

## 2023-03-11 DIAGNOSIS — K219 Gastro-esophageal reflux disease without esophagitis: Secondary | ICD-10-CM

## 2023-03-11 DIAGNOSIS — N400 Enlarged prostate without lower urinary tract symptoms: Secondary | ICD-10-CM

## 2023-03-11 DIAGNOSIS — E785 Hyperlipidemia, unspecified: Secondary | ICD-10-CM

## 2023-03-11 DIAGNOSIS — G4733 Obstructive sleep apnea (adult) (pediatric): Secondary | ICD-10-CM | POA: Diagnosis not present

## 2023-03-11 DIAGNOSIS — Z794 Long term (current) use of insulin: Secondary | ICD-10-CM

## 2023-03-11 DIAGNOSIS — I10 Essential (primary) hypertension: Secondary | ICD-10-CM

## 2023-03-11 DIAGNOSIS — E559 Vitamin D deficiency, unspecified: Secondary | ICD-10-CM | POA: Diagnosis not present

## 2023-03-11 DIAGNOSIS — E1169 Type 2 diabetes mellitus with other specified complication: Secondary | ICD-10-CM

## 2023-03-11 LAB — MICROALBUMIN / CREATININE URINE RATIO
Creatinine,U: 195.8 mg/dL
Microalb Creat Ratio: 0.4 mg/g (ref 0.0–30.0)
Microalb, Ur: <0.7 mg/dL (ref 0.0–1.9)

## 2023-03-11 MED ORDER — VALSARTAN 80 MG PO TABS: 80.0000 mg | ORAL_TABLET | Freq: Every day | ORAL | 1 refills | Status: DC

## 2023-03-11 MED ORDER — PANTOPRAZOLE SODIUM 40 MG PO TBEC: 40.0000 mg | DELAYED_RELEASE_TABLET | Freq: Every day | ORAL | 1 refills | Status: DC

## 2023-03-11 NOTE — Assessment & Plan Note (Signed)
Well controlled, no changes to meds. Encouraged heart healthy diet such as the DASH diet and exercise as tolerated.  °

## 2023-03-11 NOTE — Assessment & Plan Note (Addendum)
Tolerating statin, encouraged heart healthy diet, avoid trans fats, minimize simple carbs and saturated fats. Increase exercise as tolerated  Lab Results  Component Value Date   CHOL 120 03/01/2023   HDL 50 03/01/2023   LDLCALC 57 03/01/2023   TRIG 61 03/01/2023   CHOLHDL 3 09/09/2022

## 2023-03-11 NOTE — Assessment & Plan Note (Signed)
Check labs today.

## 2023-03-11 NOTE — Assessment & Plan Note (Signed)
Con't otc vita d3

## 2023-03-11 NOTE — Patient Instructions (Signed)

## 2023-03-11 NOTE — Progress Notes (Signed)
Established Patient Office Visit  Subjective   Patient ID: Eric [name], male    DOB: Jul 01, 1945  Age: 77 y.o. MRN: [id number]  Chief Complaint  Patient presents with   Hypertension   Hyperlipidemia   Follow-up    HPI Discussed the use of AI scribe software for clinical note transcription with the patient, who gave verbal consent to proceed.  History of Present Illness   The patient, with a known history of hernia, is scheduled for surgery on January 7th. The hernia is described as small and not at risk of incarceration or strangulation. The patient expressed a desire to proceed with the surgery despite the non-urgent nature of the condition.  In addition to the hernia, the patient recently experienced a toothache, specifically in a pre-molar. The toothache was severe enough to cause discomfort while chewing. Upon dental consultation, the patient was informed that a crown was necessary, which is scheduled for January 28th.  The patient also mentioned an upcoming colonoscopy due in January, which he is not particularly concerned about. He has had multiple normal colonoscopies in the past and is aware that if this one is also normal, it may be his last one due to his age.  The patient has been managing his medications well, with no new additions. However, he mentioned a potential need for a refill, although he was unsure which medication it was.  The patient also reported no swelling in the ankles, which was a noted improvement since his knee surgery. His blood pressure has been low, but he has not experienced any symptoms such as dizziness or lightheadedness.      Patient Active Problem List   Diagnosis Date Noted   Osteoarthritis of right knee 10/01/2022   BMI 31.0-31.9,adult 08/19/2022   BMI 34.0-34.9,adult 05/19/2022   DJD (degenerative joint disease) 05/19/2022   BMI 33.0-33.9,adult 04/21/2022   Obesity, Beginning BMI 43.79 04/21/2022   Other constipation 12/21/2021    Diabetes mellitus (HCC) 09/28/2021   Bronchitis 02/03/2021   Depression 04/02/2020   Viral upper respiratory tract infection 08/31/2019   Folliculitis 08/31/2019   Preventative health care 03/02/2019   Morbid obesity (HCC) 03/02/2019   Hypotension 10/11/2018   Status post total hip replacement, right 09/25/2018   Osteoarthritis of right hip 09/21/2018   Prediabetes 08/29/2018   Vitamin D deficiency 08/29/2018   Primary osteoarthritis of right hip 08/29/2018   Thyroid nodule 05/12/2017   Primary osteoarthritis of left hip 03/28/2017   Status post total replacement of left hip 03/28/2017   Osteoarthritis of left hip 03/25/2017   Essential hypertension 11/18/2016   Hyperlipidemia LDL goal <100 11/18/2016   Need for prophylactic vaccination and inoculation against influenza 11/18/2016   Primary localized osteoarthrosis of right shoulder 05/25/2016   History of total replacement of right shoulder joint 05/25/2016   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   S/P shoulder replacement 10/14/2015   Low back pain 09/03/2015   Left foot pain 04/04/2014   Left shoulder pain 02/20/2014   Palpitations 12/25/2012   Left hip pain 12/25/2012   Class 3 severe obesity with serious comorbidity and body mass index (BMI) of 40.0 to 44.9 in adult [facility]) 12/25/2012   Acute bronchitis 12/25/2012   Urgency of urination 12/24/2011   Urinary frequency 12/24/2011   Preop examination 10/08/2010   Hyperlipidemia 10/08/2010   Hearing loss 09/04/2009   Internal hemorrhoids 09/04/2009   ALLERGIC RHINITIS, SEASONAL 09/04/2009   GERD 09/04/2009   Diverticulosis of colon 09/04/2009   Osteoarthritis  09/04/2009   Sleep apnea 09/04/2009   Past Medical History:  Diagnosis Date   Alcohol abuse    Allergy    Back pain    Cancer (HCC)    Basal cell cancer  below left eye, had Mohs'   Cataract    forming   Colon polyps    Diverticulosis of colon    DJD (degenerative joint disease)    Dysrhythmia     Palpations from caffiene- reports he was having PACs due to slim fast wth caffeine - had full cardiac w/u and was told to cut back on caffeine;  now drink decaf bevrerages and no rpeort of recurrence    Enlarged prostate    GERD (gastroesophageal reflux disease)    Hearing loss    has HAs,   Hip osteoarthritis    Left   History of hay fever    Hx of cardiovascular stress test    Lexiscan Myoview (04/2013):  No ischemia, EF 61%; low risk   Hyperlipemia    Hypertension    Insulin resistance    om metfromin    Internal hemorrhoids without mention of complication    Leg edema    Neuroma of foot    left   Obesity    Osteoarthrosis, unspecified whether generalized or localized, unspecified site    Palpitations    Pneumonia    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   Pre-diabetes    Primary localized osteoarthrosis of right shoulder 05/25/2016   Recent weight loss    75lbs   Seasonal allergies    Sleep apnea    CPAP   Thyroid nodule    calcified thyroid nodules- bx negative    Tremor of both hands    Past Surgical History:  Procedure Laterality Date   COLONOSCOPY     JOINT REPLACEMENT  2018   POLYPECTOMY     SHOULDER SURGERY  2009   right   TONSILLECTOMY     TOTAL HIP ARTHROPLASTY Left 03/28/2017   Procedure: TOTAL HIP ARTHROPLASTY ANTERIOR APPROACH;  Surgeon: Gean Birchwood, MD;  Location: [facility] OR;  Service: Orthopedics;  Laterality: Left;   TOTAL HIP ARTHROPLASTY Right 09/25/2018   Procedure: Right Anterior Hip Arthroplasty;  Surgeon: Gean Birchwood, MD;  Location: [facility];  Service: Orthopedics;  Laterality: Right;   TOTAL KNEE ARTHROPLASTY Right 10/04/2022   Procedure: RIGHT TOTAL KNEE ARTHROPLASTY;  Surgeon: Gean Birchwood, MD;  Location: [facility];  Service: Orthopedics;  Laterality: Right;   TOTAL SHOULDER ARTHROPLASTY Left 10/14/2015   Procedure: LEFT TOTAL SHOULDER ARTHROPLASTY;  Surgeon: Teryl Lucy, MD;  Location: [facility] OR;  Service: Orthopedics;  Laterality:  Left;   TOTAL SHOULDER ARTHROPLASTY Right 05/25/2016   Procedure: TOTAL SHOULDER ARTHROPLASTY;  Surgeon: Teryl Lucy, MD;  Location: [facility] OR;  Service: Orthopedics;  Laterality: Right;   Social History   Tobacco Use   Smoking status: Never   Smokeless tobacco: Never  Vaping Use   Vaping status: Never Used  Substance Use Topics   Alcohol use: Not Currently    Alcohol/week: 3.0 standard drinks of alcohol    Types: 1 Glasses of wine, 2 Cans of beer per week    Comment: rare----12 a year   Drug use: No   Social History   Socioeconomic History   Marital status: Married    Spouse name: Ahad Argomaniz    Number of children: 3   Years of education: Not on file   Highest education level: Professional school degree (e.g., MD, DDS,  DVM, JD)  Occupational History   Occupation: Retired--[employer], [employer] ass[employer]  Tobacco Use   Smoking status: Never   Smokeless tobacco: Never  Vaping Use   Vaping status: Never Used  Substance and Sexual Activity   Alcohol use: Not Currently    Alcohol/week: 3.0 standard drinks of alcohol    Types: 1 Glasses of wine, 2 Cans of beer per week    Comment: rare----12 a year   Drug use: No   Sexual activity: Yes    Partners: Female  Other Topics Concern   Not on file  Social History Narrative   Lives with wife and daughter and two grandchildren.    Social Drivers of Corporate investment banker Strain: Low Risk  (03/04/2023)   Overall Financial Resource Strain (CARDIA)    Difficulty of Paying Living Expenses: Not hard at all  Food Insecurity: No Food Insecurity (03/04/2023)   Hunger Vital Sign    Worried About Running Out of Food in the Last Year: Never true    Ran Out of Food in the Last Year: Never true  Transportation Needs: No Transportation Needs (03/04/2023)   PRAPARE - Administrator, Civil Service (Medical): No    Lack of Transportation (Non-Medical): No  Physical Activity: Insufficiently Active (03/04/2023)   Exercise Vital  Sign    Days of Exercise per Week: 2 days    Minutes of Exercise per Session: 30 min  Stress: No Stress Concern Present (03/04/2023)   [name] of Occupational Health - Occupational Stress Questionnaire    Feeling of Stress : Not at all  Social Connections: Moderately Integrated (03/04/2023)   Social Connection and Isolation Panel [NHANES]    Frequency of Communication with Friends and Family: Once a week    Frequency of Social Gatherings with Friends and Family: Once a week    Attends Religious Services: More than 4 times per year    Active Member of [employer] or Organizations: Yes    Attends Engineer, structural: More than 4 times per year    Marital Status: Married  Catering manager Violence: Not At Risk (08/05/2021)   Humiliation, Afraid, Rape, and Kick questionnaire    Fear of Current or Ex-Partner: No    Emotionally Abused: No    Physically Abused: No    Sexually Abused: No   Family Status  Relation Name Status   Mother  Deceased   Father  Deceased at age 35       heart   Sister  Alive   Brother  Alive   PGF  (Not Specified)   Other  (Not Specified)   Neg Hx  (Not Specified)  No partnership data on file   Family History  Problem Relation Age of Onset   Arthritis Mother    Cancer Mother 62       breast   Hyperlipidemia Mother    Colon polyps Mother 19   Hypertension Mother    Heart disease Mother    Depression Mother    Anxiety disorder Mother    Obesity Mother    Breast cancer Mother    Heart disease Father        CAD--passed away 17-Apr-2012 age 10   Hyperlipidemia Father    Hypertension Father    Obesity Father    Diabetes Father    Depression Father    Sleep apnea Father    Cancer Sister 36       breast   Breast cancer Sister  Sleep apnea Sister    Cancer Brother 37       prostate   Prostate cancer Brother    Atrial fibrillation Brother    Tremor Paternal Grandfather    Breast cancer Other    Prostate cancer Other    Colon cancer Neg  Hx    Esophageal cancer Neg Hx    Rectal cancer Neg Hx    Stomach cancer Neg Hx    Allergies  Allergen Reactions   Lisinopril Cough      Review of Systems  Constitutional:  Negative for chills, fever and malaise/fatigue.  HENT:  Negative for congestion and hearing loss.   Eyes:  Negative for blurred vision and discharge.  Respiratory:  Negative for cough, sputum production and shortness of breath.   Cardiovascular:  Negative for chest pain, palpitations and leg swelling.  Gastrointestinal:  Negative for abdominal pain, blood in stool, constipation, diarrhea, heartburn, nausea and vomiting.  Genitourinary:  Negative for dysuria, frequency, hematuria and urgency.  Musculoskeletal:  Negative for back pain, falls and myalgias.  Skin:  Negative for rash.  Neurological:  Negative for dizziness, sensory change, loss of consciousness, weakness and headaches.  Endo/Heme/Allergies:  Negative for environmental allergies. Does not bruise/bleed easily.  Psychiatric/Behavioral:  Negative for depression and suicidal ideas. The patient is not nervous/anxious and does not have insomnia.       Objective:     BP 108/68 (BP Location: Left Arm, Patient Position: Sitting, Cuff Size: Large)   Pulse 68   Temp 97.8 F (36.6 C) (Oral)   Resp 18   Ht 5\' 8"  (1.727 m)   Wt 216 lb (98 kg)   SpO2 98%   BMI 32.84 kg/m  BP Readings from Last 3 Encounters:  03/11/23 108/68  03/01/23 (!) 91/56  02/15/23 116/60   Wt Readings from Last 3 Encounters:  03/11/23 216 lb (98 kg)  03/06/23 206 lb (93.4 kg)  03/01/23 206 lb (93.4 kg)   SpO2 Readings from Last 3 Encounters:  03/11/23 98%  03/01/23 96%  02/15/23 95%      Physical Exam Vitals and nursing note reviewed.  Constitutional:      General: He is not in acute distress.    Appearance: Normal appearance. He is well-developed.  HENT:     Head: Normocephalic and atraumatic.  Eyes:     General: No scleral icterus.       Right eye: No  discharge.        Left eye: No discharge.  Cardiovascular:     Rate and Rhythm: Normal rate and regular rhythm.     Heart sounds: No murmur heard. Pulmonary:     Effort: Pulmonary effort is normal. No respiratory distress.     Breath sounds: Normal breath sounds.  Musculoskeletal:        General: Normal range of motion.     Cervical back: Normal range of motion and neck supple.     Right lower leg: No edema.     Left lower leg: No edema.  Skin:    General: Skin is warm and dry.  Neurological:     Mental Status: He is alert and oriented to person, place, and time.  Psychiatric:        Mood and Affect: Mood normal.        Behavior: Behavior normal.        Thought Content: Thought content normal.        Judgment: Judgment normal.  No results found for any visits on 03/11/23.  Last CBC Lab Results  Component Value Date   WBC 6.1 03/01/2023   HGB 15.1 03/01/2023   HCT 46.3 03/01/2023   MCV 95 03/01/2023   MCH 31.1 03/01/2023   RDW 13.2 03/01/2023   PLT 191 03/01/2023   Last metabolic panel Lab Results  Component Value Date   GLUCOSE 77 03/01/2023   NA 141 03/01/2023   K 4.6 03/01/2023   CL 102 03/01/2023   CO2 24 03/01/2023   BUN 21 03/01/2023   CREATININE 1.06 03/01/2023   EGFR 73 03/01/2023   CALCIUM 9.2 03/01/2023   PROT 6.4 03/01/2023   ALBUMIN 4.3 03/01/2023   LABGLOB 2.1 03/01/2023   AGRATIO 2.0 10/22/2019   BILITOT 0.9 03/01/2023   ALKPHOS 63 03/01/2023   AST 15 03/01/2023   ALT 15 03/01/2023   ANIONGAP 7 09/22/2022   Last lipids Lab Results  Component Value Date   CHOL 120 03/01/2023   HDL 50 03/01/2023   LDLCALC 57 03/01/2023   TRIG 61 03/01/2023   CHOLHDL 3 09/09/2022   Last hemoglobin A1c Lab Results  Component Value Date   HGBA1C 5.3 03/01/2023   Last thyroid functions Lab Results  Component Value Date   TSH 0.62 09/09/2022   T3TOTAL 127 12/14/2017   T4TOTAL 9.2 03/05/2022   Last vitamin D Lab Results  Component Value Date    VD25OH 67.8 03/01/2023   Last vitamin B12 and Folate Lab Results  Component Value Date   VITAMINB12 316 03/01/2023      The ASCVD Risk score (Arnett DK, et al., 2019) failed to calculate for the following reasons:   The valid total cholesterol range is 130 to 320 mg/dL    Assessment & Plan:   Problem List Items Addressed This Visit       Unprioritized   GERD   Relevant Medications   pantoprazole (PROTONIX) 40 MG tablet   Vitamin D deficiency   Con't otc vita d3      Hyperlipidemia   Tolerating statin, encouraged heart healthy diet, avoid trans fats, minimize simple carbs and saturated fats. Increase exercise as tolerated  Lab Results  Component Value Date   CHOL 120 03/01/2023   HDL 50 03/01/2023   LDLCALC 57 03/01/2023   TRIG 61 03/01/2023   CHOLHDL 3 09/09/2022         Relevant Medications   valsartan (DIOVAN) 80 MG tablet   Essential hypertension   Well controlled, no changes to meds. Encouraged heart healthy diet such as the DASH diet and exercise as tolerated.        Relevant Medications   valsartan (DIOVAN) 80 MG tablet   Diabetes mellitus (HCC) - Primary   Check labs today       Relevant Medications   valsartan (DIOVAN) 80 MG tablet   Other Relevant Orders   Microalbumin / creatinine urine ratio   Other Visit Diagnoses       Benign prostatic hyperplasia, unspecified whether lower urinary tract symptoms present         Benign prostatic hyperplasia without lower urinary tract symptoms         Obstructive sleep apnea         Gastroesophageal reflux disease       Relevant Medications   pantoprazole (PROTONIX) 40 MG tablet     Assessment and Plan    Inguinal Hernia   The small inguinal hernia, not incarcerated or strangulated, will undergo surgical repair  on January 7th. We discussed the potential complications of leaving it untreated, including extension into the testicle, and he prefers surgery now to avoid future issues.  Toothache   He  experiences pain in a molar, likely requiring a crown, with temporary relief achieved by sanding. The pain occurs when chewing and tapping the tooth. Crown placement is scheduled for January 28th.  Hypertension   His blood pressure is well-controlled with the current medication regimen, showing a reading of 108 mmHg, and he reports feeling well with no symptoms of dizziness or lightheadedness. We will continue the current hypertension medication regimen and monitor blood pressure, advising him to report any symptoms of dizziness or lightheadedness.  General Health Maintenance   He is due for a colonoscopy in January, is aware, and expects notification for scheduling. Previous colonoscopies were normal, and the procedure was tolerable with propofol anesthesia. We will schedule the colonoscopy for January 13th and follow up on the results.  Medication Refill   He is unsure which medication needs a refill but believes he has enough through the holidays. He will check at home and send a message if a refill is needed.  Follow-up   We will schedule a follow-up appointment in six months.        Return in about 6 months (around 09/09/2023), or if symptoms worsen or fail to improve.    Donato Schultz, DO

## 2023-03-14 ENCOUNTER — Ambulatory Visit: Payer: TRICARE For Life (TFL) | Admitting: Family Medicine

## 2023-03-24 DIAGNOSIS — M25561 Pain in right knee: Secondary | ICD-10-CM | POA: Diagnosis not present

## 2023-03-29 ENCOUNTER — Ambulatory Visit (HOSPITAL_COMMUNITY): Payer: Medicare Other | Admitting: Anesthesiology

## 2023-03-29 ENCOUNTER — Other Ambulatory Visit (INDEPENDENT_AMBULATORY_CARE_PROVIDER_SITE_OTHER): Payer: Self-pay | Admitting: Family Medicine

## 2023-03-29 ENCOUNTER — Ambulatory Visit (HOSPITAL_COMMUNITY)
Admission: RE | Admit: 2023-03-29 | Discharge: 2023-03-29 | Disposition: A | Discharge status: Home / Self Care | Payer: Medicare Other | Attending: General Surgery | Admitting: General Surgery

## 2023-03-29 ENCOUNTER — Other Ambulatory Visit (HOSPITAL_COMMUNITY): Payer: Self-pay

## 2023-03-29 ENCOUNTER — Encounter (HOSPITAL_COMMUNITY): Payer: Self-pay | Admitting: General Surgery

## 2023-03-29 ENCOUNTER — Encounter (HOSPITAL_COMMUNITY)
Admission: RE | Disposition: A | Discharge status: Home / Self Care | Payer: Self-pay | Source: Home / Self Care | Attending: General Surgery

## 2023-03-29 ENCOUNTER — Other Ambulatory Visit: Payer: Self-pay

## 2023-03-29 DIAGNOSIS — I10 Essential (primary) hypertension: Secondary | ICD-10-CM | POA: Diagnosis not present

## 2023-03-29 DIAGNOSIS — G4730 Sleep apnea, unspecified: Secondary | ICD-10-CM | POA: Insufficient documentation

## 2023-03-29 DIAGNOSIS — K219 Gastro-esophageal reflux disease without esophagitis: Secondary | ICD-10-CM | POA: Diagnosis not present

## 2023-03-29 DIAGNOSIS — R7303 Prediabetes: Secondary | ICD-10-CM | POA: Insufficient documentation

## 2023-03-29 DIAGNOSIS — K4090 Unilateral inguinal hernia, without obstruction or gangrene, not specified as recurrent: Secondary | ICD-10-CM | POA: Insufficient documentation

## 2023-03-29 DIAGNOSIS — G8918 Other acute postprocedural pain: Secondary | ICD-10-CM | POA: Diagnosis not present

## 2023-03-29 HISTORY — PX: INGUINAL HERNIA REPAIR: SHX194

## 2023-03-29 SURGERY — REPAIR, HERNIA, INGUINAL, ADULT
Anesthesia: Regional | Laterality: Left

## 2023-03-29 MED ORDER — HYDROMORPHONE HCL 1 MG/ML IJ SOLN: 0.2500 mg | INTRAMUSCULAR | Status: DC | PRN

## 2023-03-29 MED ORDER — CHLORHEXIDINE GLUCONATE 0.12 % MT SOLN
15.0000 mL | Freq: Once | OROMUCOSAL | Status: AC
  Administered 2023-03-29: 15 mL via OROMUCOSAL

## 2023-03-29 MED ORDER — CHLORHEXIDINE GLUCONATE CLOTH 2 % EX PADS: 6.0000 | MEDICATED_PAD | Freq: Once | CUTANEOUS | Status: DC

## 2023-03-29 MED ORDER — ORAL CARE MOUTH RINSE: 15.0000 mL | Freq: Once | OROMUCOSAL | Status: AC

## 2023-03-29 MED ORDER — DEXAMETHASONE SODIUM PHOSPHATE 10 MG/ML IJ SOLN
INTRAMUSCULAR | Status: AC
  Filled 2023-03-29: qty 1

## 2023-03-29 MED ORDER — DEXAMETHASONE SODIUM PHOSPHATE 4 MG/ML IJ SOLN
INTRAMUSCULAR | Status: DC | PRN
  Administered 2023-03-29: 8 mg via INTRAVENOUS

## 2023-03-29 MED ORDER — FENTANYL CITRATE (PF) 100 MCG/2ML IJ SOLN
INTRAMUSCULAR | Status: AC
  Filled 2023-03-29: qty 2

## 2023-03-29 MED ORDER — OXYCODONE HCL 5 MG PO TABS
5.0000 mg | ORAL_TABLET | Freq: Once | ORAL | Status: AC
  Administered 2023-03-29: 5 mg via ORAL

## 2023-03-29 MED ORDER — ROPIVACAINE HCL 5 MG/ML IJ SOLN
INTRAMUSCULAR | Status: DC | PRN
  Administered 2023-03-29: 30 mL

## 2023-03-29 MED ORDER — ACETAMINOPHEN 500 MG PO TABS
1000.0000 mg | ORAL_TABLET | ORAL | Status: AC
  Administered 2023-03-29: 1000 mg via ORAL
  Filled 2023-03-29: qty 2

## 2023-03-29 MED ORDER — OXYCODONE HCL 5 MG PO TABS: 5.0000 mg | ORAL_TABLET | Freq: Four times a day (QID) | ORAL | 0 refills | Status: DC | PRN

## 2023-03-29 MED ORDER — ONDANSETRON HCL 4 MG/2ML IJ SOLN
INTRAMUSCULAR | Status: DC | PRN
  Administered 2023-03-29: 4 mg via INTRAVENOUS

## 2023-03-29 MED ORDER — MIDAZOLAM HCL 2 MG/2ML IJ SOLN
INTRAMUSCULAR | Status: AC
Start: 2023-03-29 — End: ?
  Filled 2023-03-29: qty 2

## 2023-03-29 MED ORDER — LIDOCAINE HCL (CARDIAC) PF 100 MG/5ML IV SOSY
PREFILLED_SYRINGE | INTRAVENOUS | Status: DC | PRN
  Administered 2023-03-29: 60 mg via INTRAVENOUS

## 2023-03-29 MED ORDER — CEFAZOLIN SODIUM-DEXTROSE 2-4 GM/100ML-% IV SOLN
2.0000 g | INTRAVENOUS | Status: AC
  Administered 2023-03-29: 2 g via INTRAVENOUS
  Filled 2023-03-29: qty 100

## 2023-03-29 MED ORDER — OXYCODONE HCL 5 MG PO TABS
ORAL_TABLET | ORAL | Status: AC
  Filled 2023-03-29: qty 1

## 2023-03-29 MED ORDER — LIDOCAINE HCL (PF) 2 % IJ SOLN
INTRAMUSCULAR | Status: AC
  Filled 2023-03-29: qty 5

## 2023-03-29 MED ORDER — MIDAZOLAM HCL 2 MG/2ML IJ SOLN
2.0000 mg | Freq: Once | INTRAMUSCULAR | Status: AC
  Administered 2023-03-29: 1 mg via INTRAVENOUS

## 2023-03-29 MED ORDER — LACTATED RINGERS IV SOLN: INTRAVENOUS | Status: DC

## 2023-03-29 MED ORDER — ONDANSETRON HCL 4 MG/2ML IJ SOLN
INTRAMUSCULAR | Status: AC
  Filled 2023-03-29: qty 2

## 2023-03-29 MED ORDER — PROPOFOL 10 MG/ML IV BOLUS
INTRAVENOUS | Status: DC | PRN
  Administered 2023-03-29: 200 mg via INTRAVENOUS

## 2023-03-29 MED ORDER — EPHEDRINE 5 MG/ML INJ
INTRAVENOUS | Status: AC
  Filled 2023-03-29: qty 5

## 2023-03-29 MED ORDER — FENTANYL CITRATE (PF) 100 MCG/2ML IJ SOLN
INTRAMUSCULAR | Status: DC | PRN
  Administered 2023-03-29 (×2): 25 ug via INTRAVENOUS
  Administered 2023-03-29: 50 ug via INTRAVENOUS

## 2023-03-29 MED ORDER — DEXAMETHASONE SODIUM PHOSPHATE 10 MG/ML IJ SOLN
INTRAMUSCULAR | Status: DC | PRN
  Administered 2023-03-29: 10 mg

## 2023-03-29 MED ORDER — BUPIVACAINE HCL 0.5 % IJ SOLN
INTRAMUSCULAR | Status: DC | PRN
  Administered 2023-03-29: 10 mL

## 2023-03-29 MED ORDER — FENTANYL CITRATE PF 50 MCG/ML IJ SOSY
100.0000 ug | PREFILLED_SYRINGE | Freq: Once | INTRAMUSCULAR | Status: AC
  Administered 2023-03-29: 50 ug via INTRAVENOUS
  Filled 2023-03-29: qty 2

## 2023-03-29 MED ORDER — BUPIVACAINE HCL (PF) 0.5 % IJ SOLN
INTRAMUSCULAR | Status: AC
  Filled 2023-03-29: qty 60

## 2023-03-29 SURGICAL SUPPLY — 37 items
BAG COUNTER SPONGE SURGICOUNT (BAG) ×1 IMPLANT
BENZOIN TINCTURE PRP APPL 2/3 (GAUZE/BANDAGES/DRESSINGS) IMPLANT
BLADE SURG 15 STRL LF DISP TIS (BLADE) ×1 IMPLANT
CHLORAPREP W/TINT 26 (MISCELLANEOUS) ×1 IMPLANT
COVER SURGICAL LIGHT HANDLE (MISCELLANEOUS) ×1 IMPLANT
DERMABOND ADVANCED .7 DNX12 (GAUZE/BANDAGES/DRESSINGS) ×1 IMPLANT
DRAIN PENROSE 0.5X18 (DRAIN) IMPLANT
DRAPE LAPAROTOMY TRNSV 102X78 (DRAPES) ×1 IMPLANT
DRAPE UTILITY XL STRL (DRAPES) ×1 IMPLANT
DRSG TELFA PLUS 4X6 ADH ISLAND (GAUZE/BANDAGES/DRESSINGS) IMPLANT
ELECT REM PT RETURN 15FT ADLT (MISCELLANEOUS) ×1 IMPLANT
GAUZE SPONGE 4X4 12PLY STRL (GAUZE/BANDAGES/DRESSINGS) IMPLANT
GLOVE BIOGEL PI IND STRL 7.0 (GLOVE) IMPLANT
GLOVE SURG SS PI 7.0 STRL IVOR (GLOVE) ×1 IMPLANT
GOWN STRL REUS W/ TWL LRG LVL3 (GOWN DISPOSABLE) ×1 IMPLANT
GOWN STRL REUS W/ TWL XL LVL3 (GOWN DISPOSABLE) IMPLANT
KIT BASIN OR (CUSTOM PROCEDURE TRAY) ×1 IMPLANT
KIT TURNOVER KIT A (KITS) IMPLANT
MARKER SKIN DUAL TIP RULER LAB (MISCELLANEOUS) ×1 IMPLANT
MESH HERNIA 3X6 (Mesh General) IMPLANT
NDL HYPO 22X1.5 SAFETY MO (MISCELLANEOUS) ×1 IMPLANT
NEEDLE HYPO 22X1.5 SAFETY MO (MISCELLANEOUS) ×1
PACK BASIC VI WITH GOWN DISP (CUSTOM PROCEDURE TRAY) ×1 IMPLANT
PENCIL SMOKE EVACUATOR (MISCELLANEOUS) ×1 IMPLANT
SPIKE FLUID TRANSFER (MISCELLANEOUS) ×1 IMPLANT
SPONGE T-LAP 4X18 ~~LOC~~+RFID (SPONGE) ×1 IMPLANT
STRIP CLOSURE SKIN 1/2X4 (GAUZE/BANDAGES/DRESSINGS) IMPLANT
SUT MNCRL AB 4-0 PS2 18 (SUTURE) ×1 IMPLANT
SUT PDS AB 2-0 CT2 27 (SUTURE) ×1 IMPLANT
SUT PROLENE 2 0 CT2 30 (SUTURE) ×2 IMPLANT
SUT VIC AB 3-0 SH 18 (SUTURE) ×1 IMPLANT
SUT VIC AB 3-0 SH 27XBRD (SUTURE) IMPLANT
SUT VIC AB 3-0 SH 8-18 (SUTURE) IMPLANT
SYR BULB IRRIG 60ML STRL (SYRINGE) ×1 IMPLANT
SYR CONTROL 10ML LL (SYRINGE) ×1 IMPLANT
TOWEL OR 17X26 10 PK STRL BLUE (TOWEL DISPOSABLE) ×1 IMPLANT
YANKAUER SUCT BULB TIP 10FT TU (MISCELLANEOUS) IMPLANT

## 2023-03-29 NOTE — Discharge Instructions (Signed)

## 2023-03-29 NOTE — Anesthesia Postprocedure Evaluation (Signed)
 Anesthesia Post Note  Patient: Eric [name]  Procedure(s) Performed: OPEN LEFT INGUINAL HERNIA REPAIR WITH MESH (Left)     Patient location during evaluation: PACU Anesthesia Type: Regional and General Level of consciousness: awake and alert Pain management: pain level controlled Vital Signs Assessment: post-procedure vital signs reviewed and stable Respiratory status: spontaneous breathing, nonlabored ventilation, respiratory function stable and patient connected to nasal cannula oxygen Cardiovascular status: blood pressure returned to baseline and stable Postop Assessment: no apparent nausea or vomiting Anesthetic complications: no   No notable events documented.  Last Vitals:  Vitals:   03/29/23 1345 03/29/23 1406  BP: (!) 105/49 (!) 151/89  Pulse: (!) 57 96  Resp: 12 14  Temp: 36.4 C 36.7 C  SpO2: 97% 100%    Last Pain:  Vitals:   03/29/23 1445  TempSrc:   PainSc: 4                  [place] P Lela Gell

## 2023-03-29 NOTE — Anesthesia Procedure Notes (Signed)
 Procedure Name: LMA Insertion Date/Time: 03/29/2023 11:48 AM  Performed by: Erick Fitz, CRNAPre-anesthesia Checklist: Patient identified, Emergency Drugs available, Suction available, Patient being monitored and Timeout performed Patient Re-evaluated:Patient Re-evaluated prior to induction Oxygen Delivery Method: Circle system utilized Preoxygenation: Pre-oxygenation with 100% oxygen Induction Type: IV induction Ventilation: Mask ventilation without difficulty LMA: LMA with gastric port inserted LMA Size: 4.0 Number of attempts: 1 Dental Injury: Teeth and Oropharynx as per pre-operative assessment

## 2023-03-29 NOTE — Transfer of Care (Signed)
 Immediate Anesthesia Transfer of Care Note  Patient: Eric [name]  Procedure(s) Performed: OPEN LEFT INGUINAL HERNIA REPAIR WITH MESH (Left)  Patient Location: PACU  Anesthesia Type:General  Level of Consciousness: awake, alert , oriented, and patient cooperative  Airway & Oxygen Therapy: Patient Spontanous Breathing and Patient connected to face mask oxygen  Post-op Assessment: Report given to RN and Post -op Vital signs reviewed and stable  Post vital signs: Reviewed and stable  Last Vitals:  Vitals Value Taken Time  BP 137/78 03/29/23 1307  Temp    Pulse 68 03/29/23 1309  Resp 18 03/29/23 1309  SpO2 100 % 03/29/23 1309  Vitals shown include unfiled device data.  Last Pain:  Vitals:   03/29/23 1125  TempSrc:   PainSc: 0-No pain         Complications: No notable events documented.

## 2023-03-29 NOTE — H&P (Signed)
 Chief Complaint: New Consultation (HERNIA)   History of Present Illness: Eric [name] is a 78 y.o. male who is seen today as an office consultation at the request of Dr. Antonio for evaluation of New Consultation (HERNIA) .   He first noticed the hernia 4 weeks ago. Symptoms are pain and a bulge in the area. He massages the area with relief. He denies nausea or vomiting or bowel habit change.  He does not smoke He does not have diabetes He has no history of hernias  Review of Systems: A complete review of systems was obtained from the patient. I have reviewed this information and discussed as appropriate with the patient. See HPI as well for other ROS.  Review of Systems  Constitutional: Negative.  HENT: Negative.  Eyes: Negative.  Respiratory: Negative.  Cardiovascular: Negative.  Gastrointestinal: Negative.  Genitourinary: Negative.  Musculoskeletal: Negative.  Skin: Negative.  Neurological: Negative.  Endo/Heme/Allergies: Negative.  Psychiatric/Behavioral: Negative.   Medical History: Past Medical History:  Diagnosis Date  Arthritis  GERD (gastroesophageal reflux disease)  History of cancer  Hyperlipidemia  Hypertension  Sleep apnea  Thyroid  disease   There is no problem list on file for this patient.  Past Surgical History:  Procedure Laterality Date  JOINT REPLACEMENT   Allergies  Allergen Reactions  Lisinopril  Cough   Current Outpatient Medications on File Prior to Visit  Medication Sig Dispense Refill  aspirin  81 MG EC tablet Take 8 mg by mouth 2 (two) times daily  atorvastatin  (LIPITOR) 40 MG tablet Take 40 mg by mouth once daily  co-enzyme Q-10, ubiquinone, 200 mg capsule Take 200 mg by mouth at bedtime  loratadine  (CLARITIN ) 10 mg tablet Take 10 mg by mouth once daily  meloxicam  (MOBIC ) 15 MG tablet Take 15 mg by mouth once daily  MOUNJARO  7.5 mg/0.5 mL pen injector  pantoprazole  (PROTONIX ) 40 MG DR tablet Take 1 tablet by mouth once daily   tamsulosin  (FLOMAX ) 0.4 mg capsule Take 0.4 mg by mouth daily after dinner  valsartan  (DIOVAN ) 80 MG tablet Take 80 mg by mouth once daily   No current facility-administered medications on file prior to visit.   Family History  Problem Relation Age of Onset  Coronary Artery Disease (Blocked arteries around heart) Mother  Breast cancer Mother  High blood pressure (Hypertension) Mother  Hyperlipidemia (Elevated cholesterol) Father  High blood pressure (Hypertension) Father  Coronary Artery Disease (Blocked arteries around heart) Father  Diabetes Father  High blood pressure (Hypertension) Sister  Hyperlipidemia (Elevated cholesterol) Sister  Coronary Artery Disease (Blocked arteries around heart) Sister  Breast cancer Sister  Skin cancer Brother  High blood pressure (Hypertension) Brother  Hyperlipidemia (Elevated cholesterol) Brother  Coronary Artery Disease (Blocked arteries around heart) Brother   Social History   Tobacco Use  Smoking Status Never  Smokeless Tobacco Never   Social History   Socioeconomic History  Marital status: Married  Tobacco Use  Smoking status: Never  Smokeless tobacco: Never  Vaping Use  Vaping status: Never Used  Substance and Sexual Activity  Alcohol use: Yes  Drug use: Never   Social Drivers of Corporate Investment Banker Strain: Low Risk (02/14/2023)  Received from [facility]  Overall Financial Resource Strain (CARDIA)  Difficulty of Paying Living Expenses: Not hard at all  Food Insecurity: No Food Insecurity (02/14/2023)  Received from [facility]  Hunger Vital Sign  Worried About Running Out of Food in the Last Year: Never true  Ran Out of Food in the Last  Year: Never true  Transportation Needs: No Transportation Needs (02/14/2023)  Received from [facility] - Transportation  Lack of Transportation (Medical): No  Lack of Transportation (Non-Medical): No  Physical Activity: Insufficiently Active (02/14/2023)  Received  from [facility]  Exercise Vital Sign  Days of Exercise per Week: 2 days  Minutes of Exercise per Session: 30 min  Stress: No Stress Concern Present (02/14/2023)  Received from [facility] - Occupational Stress Questionnaire  Feeling of Stress : Not at all  Social Connections: Socially Integrated (02/14/2023)  Received from [facility]  Social Connection and Isolation Panel [NHANES]  Frequency of Communication with Friends and Family: More than three times a week  Frequency of Social Gatherings with Friends and Family: Once a week  Attends Religious Services: More than 4 times per year  Active Member of [employer] or Organizations: No  Attends Engineer, Structural: More than 4 times per year  Marital Status: Married   Objective:   Vitals:  02/24/23 1352  BP: 110/75  Pulse: 78  Temp: 36.8 C (98.2 F)  SpO2: 97%  Weight: 96.8 kg (213 lb 6.4 oz)  Height: 172.7 cm (5' 8)  PainSc: 0-No pain   Body mass index is 32.45 kg/m.  Physical Exam Constitutional:  Appearance: Normal appearance.  HENT:  Head: Normocephalic and atraumatic.  Pulmonary:  Effort: Pulmonary effort is normal.  Abdominal:  Comments: Palpable small left inguinal hernia  Musculoskeletal:  General: Normal range of motion.  Cervical back: Normal range of motion.  Neurological:  General: No focal deficit present.  Mental Status: He is alert and oriented to person, place, and time. Mental status is at baseline.  Psychiatric:  Mood and Affect: Mood normal.  Behavior: Behavior normal.  Thought Content: Thought content normal.     Labs, Imaging and Diagnostic Testing: I reviewed notes by Jamee Shanks  Assessment and Plan:  Diagnoses and all orders for this visit:  Non-recurrent unilateral inguinal hernia without obstruction or gangrene  Prediabetes  Observed sleep apnea    We discussed etiology of hernias and how they can cause pain. We discussed  options for inguinal hernia repair vs observation. We discussed details of the surgery of general anesthesia, surgical approach and incisions, dissecting the sack away from vas deference, testicular vessels and nerves and placement of mesh. We discussed risks of bleeding, infection, recurrence, injury to vas deference, testicular vessels, nerve injury, and chronic pain. He showed good understanding and wanted proceed with open left inguinal hernia repair as outpatient.

## 2023-03-29 NOTE — Anesthesia Procedure Notes (Signed)
 Anesthesia Regional Block: TAP block   Pre-Anesthetic Checklist: , timeout performed,  Correct Patient, Correct Site, Correct Laterality,  Correct Procedure, Correct Position, site marked,  Risks and benefits discussed,  Surgical consent,  Pre-op evaluation,  At surgeon's request and post-op pain management  Laterality: Left  Prep: Dura Prep       Needles:  Injection technique: Single-shot  Needle Type: Echogenic Stimulator Needle     Needle Length: 10cm  Needle Gauge: 20     Additional Needles:   Procedures:,,,, ultrasound used (permanent image in chart),,    Narrative:  Start time: 03/29/2023 11:20 AM End time: 03/29/2023 11:24 AM Injection made incrementally with aspirations every 5 mL.  Performed by: Personally  Anesthesiologist: Dorethea Cordella SQUIBB, DO  Additional Notes: Patient identified. Risks/Benefits/Options discussed with patient including but not limited to bleeding, infection, nerve damage, failed block, incomplete pain control. Patient expressed understanding and wished to proceed. All questions were answered. Sterile technique was used throughout the entire procedure. Please see nursing notes for vital signs. Aspirated in 5cc intervals with injection for negative confirmation. Patient was given instructions on fall risk and not to get out of bed. All questions and concerns addressed with instructions to call with any issues or inadequate analgesia.

## 2023-03-29 NOTE — Anesthesia Preprocedure Evaluation (Signed)
 Anesthesia Evaluation  Patient identified by MRN, date of birth, ID band Patient awake    Reviewed: Allergy & Precautions, NPO status , Patient's Chart, lab work & pertinent test results  Airway Mallampati: II  TM Distance: >3 FB Neck ROM: Full    Dental no notable dental hx.    Pulmonary sleep apnea and Continuous Positive Airway Pressure Ventilation    Pulmonary exam normal        Cardiovascular hypertension, Pt. on medications + dysrhythmias  Rhythm:Regular Rate:Normal     Neuro/Psych    Depression       GI/Hepatic Neg liver ROS,GERD  Medicated,,  Endo/Other  diabetes, Oral Hypoglycemic Agents    Renal/GU negative Renal ROS  negative genitourinary   Musculoskeletal  (+) Arthritis , Osteoarthritis,    Abdominal Normal abdominal exam  (+)   Peds  Hematology Lab Results      Component                Value               Date                      WBC                      6.1                 03/01/2023                HGB                      15.1                03/01/2023                HCT                      46.3                03/01/2023                MCV                      95                  03/01/2023                PLT                      191                 03/01/2023              Anesthesia Other Findings   Reproductive/Obstetrics                             Anesthesia Physical Anesthesia Plan  ASA: 2  Anesthesia Plan: General and Regional   Post-op Pain Management: Regional block*   Induction: Intravenous  PONV Risk Score and Plan: 2 and Ondansetron , Dexamethasone  and Treatment may vary due to age or medical condition  Airway Management Planned: Mask and LMA  Additional Equipment: None  Intra-op Plan:   Post-operative Plan: Extubation in OR  Informed Consent: I have reviewed the patients History and Physical, chart, labs and discussed the procedure including the  risks, benefits  and alternatives for the proposed anesthesia with the patient or authorized representative who has indicated his/her understanding and acceptance.     Dental advisory given  Plan Discussed with: CRNA  Anesthesia Plan Comments:        Anesthesia Quick Evaluation

## 2023-03-29 NOTE — Op Note (Signed)
 Preop diagnosis: left inguinal hernia  Postop diagnosis: left inguinal hernia  Procedure: open Left inguinal hernia repair with mesh  Surgeon: Herlene Bureau, M.D.  Asst: none  Anesthesia: Gen.   Indications for procedure: EZEL VALLONE is a 78 y.o. male with symptoms of pain and enlarging Left inguinal hernia(s). After discussing risks, alternatives and benefits he decided on open repair and was brought to day surgery for repair.  Description of procedure: The patient was brought into the operative suite, placed supine. Anesthesia was administered with endotracheal tube. Patient was strapped in place. The patient was prepped and draped in the usual sterile fashion.  The anterior superior iliac spine and pubic tubercle were identified on the Left side. An incision was made 1cm above the connecting line, representative of the location of the inguinal ligament. The subcutaneous tissue was bluntly dissected, scarpa's fascia was dissected away. The external abdominal oblique fascia was identified and sharply opened down to the external inguinal ring. The conjoint tendon and inguinal ligament were identified. The cord structures and sac were dissected free of the surrounding tissue in 360 degrees. A penrose drain was used to encircle the contents. The cremasteric fibers were dissected free of the contents of the cord and hernia sac. The cord structures (vessels and vas deferens) were identified and carefully dissected away from the hernia sac. The hernia sac contained colon and was opened and contents reduced into the peritoneal space.The hernia sac was dissected down to the internal inguinal ring. Preperitoneal fat was identified showing appropriate dissection. The sac was then reduced into the preperitoneal space. The canal floor was large and closed with interrupted 2-0 PDS apposing the conjoint tendon to the inguinal ligament medially. A 3x6 Bard mesh was then used to close the defect and  reinforce the floor. The mesh was sutured to the lacunar ligament and inguinal ligament using a 2-0 prolene in running fashion. Next the superior edge of the mesh was sutured to the conjoined tendon using a 2-0 running Prolene. An additional 2-0 Prolene was used to suture the tail ends of the mesh together re-creating the deep ring. Cord structures are running in a neutral position through the mesh. Next the external abdominal oblique fascia was closed with a 2-0 Vicryl in interrupted fashion to re-create the external inguinal ring. Scarpa's fascia was closed with 3-0 Vicryl in running fashion. Skin was closed with a 4-0 Monocryl subcuticular stitch in running fashion. Dermabond place for dressing. Patient woke from anesthesia and brought to PACU in stable condition. All counts are correct.    Findings: left indirect inguinal hernia inguinal hernia  Specimen: none  Blood loss: 20 ml  Local anesthesia: 10 ml Marcaine   Complications: none  Implant: 3 x 6 in Bard mesh  Herlene Bureau, M.D. General, Bariatric, & Minimally Invasive Surgery [facility], [place] 12:56 PM 03/29/2023

## 2023-03-30 ENCOUNTER — Encounter (HOSPITAL_COMMUNITY): Payer: Self-pay | Admitting: General Surgery

## 2023-03-30 ENCOUNTER — Telehealth (INDEPENDENT_AMBULATORY_CARE_PROVIDER_SITE_OTHER): Payer: Self-pay | Admitting: *Deleted

## 2023-03-30 ENCOUNTER — Encounter: Payer: Self-pay | Admitting: Family Medicine

## 2023-03-30 NOTE — Telephone Encounter (Signed)
 PA SUBMITTED VIA [employer] FOR MOUNJARO   Eric [name] (Key: [id number])  This request has been approved.  Outcome Approved today by [facility] [employer] 2017 RjdzPi:[id number];Dujuld:Jeemnczi;Review Type:Prior Auth;Coverage Start Date:02/28/2023;Coverage End Date:03/21/2098; Authorization Expiration Date: 03/20/2098   PATIENT NOTIFIED VIA MYCHART.

## 2023-04-06 ENCOUNTER — Other Ambulatory Visit: Payer: Self-pay | Admitting: Family Medicine

## 2023-04-07 ENCOUNTER — Other Ambulatory Visit: Payer: Self-pay

## 2023-04-07 ENCOUNTER — Other Ambulatory Visit (INDEPENDENT_AMBULATORY_CARE_PROVIDER_SITE_OTHER): Payer: Self-pay | Admitting: Family Medicine

## 2023-04-07 ENCOUNTER — Other Ambulatory Visit (HOSPITAL_COMMUNITY): Payer: Self-pay

## 2023-04-07 DIAGNOSIS — Z794 Long term (current) use of insulin: Secondary | ICD-10-CM

## 2023-04-07 NOTE — Telephone Encounter (Signed)
LAST APPOINTMENT DATE: 03/01/2023 NEXT APPOINTMENT DATE: 04/13/2023   [facility] - [place], [place] - [address] [place] [address] Phone: [phone] Fax: [phone]  Patient is requesting a refill of the following medications: Requested Prescriptions   Pending Prescriptions Disp Refills   tirzepatide (MOUNJARO) 7.5 MG/0.5ML Pen 2 mL 0    Sig: Inject 7.5 mg into the skin once a week.    Date last filled: 02/01/2023 Previously prescribed by Quillian Quince MD  Lab Results  Component Value Date   HGBA1C 5.3 03/01/2023   HGBA1C 5.4 09/09/2022   HGBA1C 5.3 03/05/2022   Lab Results  Component Value Date   MICROALBUR <0.7 03/11/2023   LDLCALC 57 03/01/2023   CREATININE 1.06 03/01/2023   Lab Results  Component Value Date   VD25OH 67.8 03/01/2023   VD25OH 77.43 09/09/2022   VD25OH 70.37 03/05/2022    BP Readings from Last 3 Encounters:  03/29/23 (!) 151/89  03/11/23 108/68  03/01/23 (!) 91/56

## 2023-04-08 ENCOUNTER — Other Ambulatory Visit (HOSPITAL_COMMUNITY): Payer: Self-pay

## 2023-04-08 ENCOUNTER — Encounter: Payer: Self-pay | Admitting: Family Medicine

## 2023-04-08 ENCOUNTER — Other Ambulatory Visit: Payer: Self-pay | Admitting: Family Medicine

## 2023-04-11 ENCOUNTER — Other Ambulatory Visit: Payer: Self-pay | Admitting: *Deleted

## 2023-04-11 MED ORDER — MELOXICAM 15 MG PO TABS: 15.0000 mg | ORAL_TABLET | Freq: Every day | ORAL | 3 refills | Status: DC

## 2023-04-12 ENCOUNTER — Other Ambulatory Visit (HOSPITAL_COMMUNITY): Payer: Self-pay

## 2023-04-12 NOTE — Telephone Encounter (Signed)
Ok to refill 

## 2023-04-13 ENCOUNTER — Ambulatory Visit (INDEPENDENT_AMBULATORY_CARE_PROVIDER_SITE_OTHER): Payer: Medicare Other | Admitting: Family Medicine

## 2023-04-13 ENCOUNTER — Encounter (INDEPENDENT_AMBULATORY_CARE_PROVIDER_SITE_OTHER): Payer: Self-pay | Admitting: Family Medicine

## 2023-04-13 ENCOUNTER — Other Ambulatory Visit: Payer: Self-pay

## 2023-04-13 ENCOUNTER — Other Ambulatory Visit (HOSPITAL_COMMUNITY): Payer: Self-pay

## 2023-04-13 ENCOUNTER — Telehealth: Payer: Self-pay | Admitting: Internal Medicine

## 2023-04-13 VITALS — BP 117/71 | HR 70 | Temp 98.0°F | Ht 68.0 in | Wt 206.0 lb

## 2023-04-13 DIAGNOSIS — E669 Obesity, unspecified: Secondary | ICD-10-CM

## 2023-04-13 DIAGNOSIS — Z794 Long term (current) use of insulin: Secondary | ICD-10-CM | POA: Diagnosis not present

## 2023-04-13 DIAGNOSIS — Z6831 Body mass index (BMI) 31.0-31.9, adult: Secondary | ICD-10-CM

## 2023-04-13 DIAGNOSIS — E119 Type 2 diabetes mellitus without complications: Secondary | ICD-10-CM | POA: Diagnosis not present

## 2023-04-13 DIAGNOSIS — E1169 Type 2 diabetes mellitus with other specified complication: Secondary | ICD-10-CM

## 2023-04-13 DIAGNOSIS — E538 Deficiency of other specified B group vitamins: Secondary | ICD-10-CM

## 2023-04-13 DIAGNOSIS — Z7985 Long-term (current) use of injectable non-insulin antidiabetic drugs: Secondary | ICD-10-CM

## 2023-04-13 MED ORDER — MOUNJARO 7.5 MG/0.5ML ~~LOC~~ SOAJ
7.5000 mg | SUBCUTANEOUS | 0 refills | Status: DC
  Filled 2023-04-13 (×3): qty 2, 28d supply, fill #0

## 2023-04-13 NOTE — Telephone Encounter (Signed)
Hi Dr. Marina Goodell,   Patient called to schedule recall colonoscopy. Please advise on scheduling.   Thank you

## 2023-04-13 NOTE — Progress Notes (Signed)
.smr  Office: [phone]  /  Fax: [phone]  WEIGHT SUMMARY AND BIOMETRICS  Anthropometric Measurements Height: 5\' 8"  (1.727 m) Weight: 206 lb (93.4 kg) BMI (Calculated): 31.33 Weight at Last Visit: 206 lb Weight Lost Since Last Visit: 0 Weight Gained Since Last Visit: 0 Starting Weight: 288 lb Total Weight Loss (lbs): 82 lb (37.2 kg)   Body Composition  Body Fat %: 38.2 % Fat Mass (lbs): 78.8 lbs Muscle Mass (lbs): 121.2 lbs Total Body Water (lbs): 93.2 lbs Visceral Fat Rating : 22   Other Clinical Data Fasting: Yes Labs: No Today's Visit #: 43 Starting Date: 12/14/17    Chief Complaint: OBESITY    History of Present Illness   The patient, with a history of obesity, has maintained his weight over the holiday season, adhering to his category three diet approximately 20% of the time. He has been incorporating physical activity into his routine, such as shoveling snow, aiming for thirty minutes of exercise three times per week.  The patient underwent an inguinal hernia repair on January 7th. Post-operatively, he experienced minimal pain, requiring only two doses of pain medication and primarily managing discomfort with Tylenol and Mobic. He reports a weight gain of six pounds three days ago, which he attributes to fluid retention post-surgery.  The patient has been experiencing issues with his medication, Mounjaro, due to stock shortages at [employer]. He has been authorized to use [employer] as an alternative. He reports a delay in receiving his medication from [facility], which was supposed to be delivered two days ago. He is concerned about the delay and the potential impact on his treatment regimen.  The patient has been experiencing cravings, which he attributes to recovery from surgery and being housebound.  The patient's recent labs show a slightly low B12 level of 316, despite a diet rich in B12. He has been advised to supplement his B12  intake. The patient's other lab results, including electrolytes, kidney and liver function, microalbumin ratio, cholesterol, and vitamin D, are all within normal ranges.          PHYSICAL EXAM:  Blood pressure 117/71, pulse 70, temperature 98 F (36.7 C), height 5\' 8"  (1.727 m), weight 206 lb (93.4 kg), SpO2 98%. Body mass index is 31.32 kg/m.  DIAGNOSTIC DATA REVIEWED:  BMET    Component Value Date/Time   NA 141 03/01/2023 1049   K 4.6 03/01/2023 1049   CL 102 03/01/2023 1049   CO2 24 03/01/2023 1049   GLUCOSE 77 03/01/2023 1049   GLUCOSE 97 09/22/2022 0902   BUN 21 03/01/2023 1049   CREATININE 1.06 03/01/2023 1049   CREATININE 0.93 04/23/2016 1633   CALCIUM 9.2 03/01/2023 1049   GFRNONAA >60 09/22/2022 0902   GFRAA 74 10/22/2019 1243   Lab Results  Component Value Date   HGBA1C 5.3 03/01/2023   HGBA1C 5.5 03/05/2015   Lab Results  Component Value Date   INSULIN 4.5 03/01/2023   INSULIN 21.3 12/14/2017   Lab Results  Component Value Date   TSH 0.62 09/09/2022   CBC    Component Value Date/Time   WBC 6.1 03/01/2023 1049   WBC 6.6 09/22/2022 0902   RBC 4.86 03/01/2023 1049   RBC 4.99 09/22/2022 0902   HGB 15.1 03/01/2023 1049   HCT 46.3 03/01/2023 1049   PLT 191 03/01/2023 1049   MCV 95 03/01/2023 1049   MCH 31.1 03/01/2023 1049   MCH 31.1 09/22/2022 0902   MCHC 32.6 03/01/2023 1049  MCHC 32.4 09/22/2022 0902   RDW 13.2 03/01/2023 1049   Iron Studies No results found for: "IRON", "TIBC", "FERRITIN", "IRONPCTSAT" Lipid Panel     Component Value Date/Time   CHOL 120 03/01/2023 1049   TRIG 61 03/01/2023 1049   HDL 50 03/01/2023 1049   CHOLHDL 3 09/09/2022 0927   VLDL 13.6 09/09/2022 0927   LDLCALC 57 03/01/2023 1049   Hepatic Function Panel     Component Value Date/Time   PROT 6.4 03/01/2023 1049   ALBUMIN 4.3 03/01/2023 1049   AST 15 03/01/2023 1049   ALT 15 03/01/2023 1049   ALKPHOS 63 03/01/2023 1049   BILITOT 0.9 03/01/2023 1049    BILIDIR 0.2 06/07/2014 0909      Component Value Date/Time   TSH 0.62 09/09/2022 0927   Nutritional Lab Results  Component Value Date   VD25OH 67.8 03/01/2023   VD25OH 77.43 09/09/2022   VD25OH 70.37 03/05/2022     Assessment and Plan    Obesity and DM II Maintained weight over the holidays, following category three diet 20% of the time. Engaging in physical activity, including shoveling snow and aiming for 30 minutes of exercise three times per week. Currently on Mounjaro for weight management, experiencing issues with medication supply. Mounjaro provides optimal blood sugar control and weight loss in the first seven days, with medication lasting four weeks in the system. Discussed the possibility of a higher dose if adherence to the diet does not improve hunger control. - Prescribe one-month supply of Mounjaro 7.5 mg - Advise to explain delay to insurance for an override - Encourage strict adherence to category three diet for a month before considering a higher dose - Schedule follow-up in four weeks  Vitamin B12 Deficiency Recent labs show a B12 level of 316, low end of normal range, likely due to metformin use affecting B12 absorption. Recommended B12 supplement, 1000 mcg daily, preferably from a recognizable brand. - Recommend over-the-counter B12 supplement, 1000 mcg daily - Advise to purchase a recognizable brand  General Health Maintenance Managing health well with regular check-ups and lab work. On metformin and vitamin D, with a recent 90-day supply of metformin and six months' worth of vitamin D remaining. Encouraged regular physical activity, aiming for at least 6000 steps per day. Discussed the use of a step-counting device to monitor daily activity. - Continue current medications as prescribed - Discuss the use of a step-counting device to monitor daily activity  Follow-up - Schedule follow-up appointments for February and March.          He was informed of the  importance of frequent follow up visits to maximize his success with intensive lifestyle modifications for his multiple health conditions.    Quillian Quince, MD

## 2023-04-13 NOTE — Telephone Encounter (Signed)
Reviewed. Okay to schedule. Have him see a previsit nurse. Thanks, Dr.  Marina Goodell

## 2023-04-18 ENCOUNTER — Encounter: Payer: Self-pay | Admitting: Internal Medicine

## 2023-04-18 ENCOUNTER — Other Ambulatory Visit (HOSPITAL_COMMUNITY): Payer: Self-pay

## 2023-04-19 ENCOUNTER — Other Ambulatory Visit: Payer: Self-pay | Admitting: Medical Genetics

## 2023-05-03 ENCOUNTER — Other Ambulatory Visit (HOSPITAL_COMMUNITY): Payer: Self-pay

## 2023-05-12 ENCOUNTER — Other Ambulatory Visit (HOSPITAL_COMMUNITY)
Admission: RE | Admit: 2023-05-12 | Discharge: 2023-05-12 | Disposition: A | Discharge status: Home / Self Care | Payer: Self-pay | Source: Ambulatory Visit | Attending: Medical Genetics | Admitting: Medical Genetics

## 2023-05-19 ENCOUNTER — Ambulatory Visit (AMBULATORY_SURGERY_CENTER): Payer: Medicare Other

## 2023-05-19 VITALS — Ht 68.0 in | Wt 207.0 lb

## 2023-05-19 DIAGNOSIS — Z860100 Personal history of colon polyps, unspecified: Secondary | ICD-10-CM

## 2023-05-19 MED ORDER — SUFLAVE 178.7 G PO SOLR: 1.0000 | Freq: Once | ORAL | 0 refills | Status: AC

## 2023-05-19 NOTE — Progress Notes (Signed)
 No egg or soy allergy known to patient  No issues known to pt with past sedation with any surgeries or procedures Patient denies ever being told they had issues or difficulty with intubation  No FH of Malignant Hyperthermia Pt is not on diet pills Pt is not on  home 02  Pt is not on blood thinners  Pt has issues with constipation d/t Monjoura No A fib or A flutter Have any cardiac testing pending--No Pt can ambulate  Pt denies use of chewing tobacco Discussed diabetic I weight loss medication holds Discussed NSAID holds Checked BMI Pt instructed to use Singlecare.com or GoodRx for a price reduction on prep  Patient's chart reviewed by Cathlyn Parsons CNRA prior to previsit and patient appropriate for the [facility].  Pre visit completed and red dot placed by patient's name on their procedure day (on provider's schedule).

## 2023-05-22 LAB — GENECONNECT MOLECULAR SCREEN: Genetic Analysis Overall Interpretation: NEGATIVE

## 2023-05-23 ENCOUNTER — Ambulatory Visit (INDEPENDENT_AMBULATORY_CARE_PROVIDER_SITE_OTHER): Payer: Medicare Other | Admitting: Family Medicine

## 2023-05-23 ENCOUNTER — Encounter (INDEPENDENT_AMBULATORY_CARE_PROVIDER_SITE_OTHER): Payer: Self-pay | Admitting: Family Medicine

## 2023-05-23 VITALS — BP 117/77 | HR 64 | Temp 98.3°F | Ht 68.0 in | Wt 204.0 lb

## 2023-05-23 DIAGNOSIS — Z7984 Long term (current) use of oral hypoglycemic drugs: Secondary | ICD-10-CM | POA: Diagnosis not present

## 2023-05-23 DIAGNOSIS — E538 Deficiency of other specified B group vitamins: Secondary | ICD-10-CM | POA: Diagnosis not present

## 2023-05-23 DIAGNOSIS — Z6831 Body mass index (BMI) 31.0-31.9, adult: Secondary | ICD-10-CM | POA: Diagnosis not present

## 2023-05-23 DIAGNOSIS — Z7985 Long-term (current) use of injectable non-insulin antidiabetic drugs: Secondary | ICD-10-CM | POA: Diagnosis not present

## 2023-05-23 DIAGNOSIS — E559 Vitamin D deficiency, unspecified: Secondary | ICD-10-CM | POA: Diagnosis not present

## 2023-05-23 DIAGNOSIS — E1169 Type 2 diabetes mellitus with other specified complication: Secondary | ICD-10-CM

## 2023-05-23 DIAGNOSIS — E119 Type 2 diabetes mellitus without complications: Secondary | ICD-10-CM

## 2023-05-23 DIAGNOSIS — E669 Obesity, unspecified: Secondary | ICD-10-CM

## 2023-05-23 MED ORDER — VITAMIN D (ERGOCALCIFEROL) 1.25 MG (50000 UNIT) PO CAPS: ORAL_CAPSULE | ORAL | 0 refills | Status: DC

## 2023-05-23 MED ORDER — MOUNJARO 7.5 MG/0.5ML ~~LOC~~ SOAJ: 7.5000 mg | SUBCUTANEOUS | 0 refills | Status: DC

## 2023-05-23 NOTE — Progress Notes (Signed)
 Office: [phone]  /  Fax: [phone]  WEIGHT SUMMARY AND BIOMETRICS  Anthropometric Measurements Height: 5\' 8"  (1.727 m) Weight: 204 lb (92.5 kg) BMI (Calculated): 31.03 Weight at Last Visit: 207 lb Weight Lost Since Last Visit: 3 lb Weight Gained Since Last Visit: 0 Starting Weight: 288 lb Total Weight Loss (lbs): 84 lb (38.1 kg) Peak Weight: 288 lb   Body Composition  Body Fat %: 37.6 % Fat Mass (lbs): 76.8 lbs Muscle Mass (lbs): 120.8 lbs Total Body Water (lbs): 91.4 lbs Visceral Fat Rating : 22   Other Clinical Data Fasting: yes Labs: no Today's Visit #: 8 Starting Date: 12/14/17    Chief Complaint: OBESITY   Discussed the use of AI scribe software for clinical note transcription with the patient, who gave verbal consent to proceed.  History of Present Illness   Eric [name] "Eric [name]" is a 78 year old male who presents for a follow-up visit regarding weight management and post-surgical recovery. He is accompanied by his wife.  He has recently lost two pounds and is actively working on his diet, focusing on adequate protein intake. Breakfast is his most successful meal, but he struggles with lunch and dinner due to apathy. He uses protein bars to supplement his intake. No issues with hypoglycemia. Since losing weight, he experiences cold intolerance, which he attributes to changes in his body's thermostat. He also notes occasional feelings of being hot, requiring adjustments in clothing during the night.  He underwent knee surgery in July and reports ongoing numbness, itching, and warmth in the knee area. He has not attempted walking long distances due to concerns about the knee's condition. He is unable to kneel without a pillow.  He had hernia surgery on January 7th, which he describes as having gone well, requiring only two pain pills post-operatively. He experienced a reaction to fentanyl during the procedure, causing visual disturbances.  He  mentions a recent fall that has made him cautious about using weights for exercise. He is hesitant to walk far from his house due to fear of falling.  He is currently managing diabetes with Mounjaro and metformin, with a three-month supply of each. He is interested in non-invasive glucose monitoring devices, which are not yet FDA approved.  He requires vitamin D supplementation and has a history of low B12 levels. He is currently taking 1000 mcg of B12 daily and vitamin D supplementation as needed.          PHYSICAL EXAM:  Blood pressure 117/77, pulse 64, temperature 98.3 F (36.8 C), height 5\' 8"  (1.727 m), weight 204 lb (92.5 kg), SpO2 99%. Body mass index is 31.02 kg/m.  DIAGNOSTIC DATA REVIEWED:  BMET    Component Value Date/Time   NA 141 03/01/2023 1049   K 4.6 03/01/2023 1049   CL 102 03/01/2023 1049   CO2 24 03/01/2023 1049   GLUCOSE 77 03/01/2023 1049   GLUCOSE 97 09/22/2022 0902   BUN 21 03/01/2023 1049   CREATININE 1.06 03/01/2023 1049   CREATININE 0.93 04/23/2016 1633   CALCIUM 9.2 03/01/2023 1049   GFRNONAA >60 09/22/2022 0902   GFRAA 74 10/22/2019 1243   Lab Results  Component Value Date   HGBA1C 5.3 03/01/2023   HGBA1C 5.5 03/05/2015   Lab Results  Component Value Date   INSULIN 4.5 03/01/2023   INSULIN 21.3 12/14/2017   Lab Results  Component Value Date   TSH 0.62 09/09/2022   CBC    Component Value Date/Time   WBC  6.1 03/01/2023 1049   WBC 6.6 09/22/2022 0902   RBC 4.86 03/01/2023 1049   RBC 4.99 09/22/2022 0902   HGB 15.1 03/01/2023 1049   HCT 46.3 03/01/2023 1049   PLT 191 03/01/2023 1049   MCV 95 03/01/2023 1049   MCH 31.1 03/01/2023 1049   MCH 31.1 09/22/2022 0902   MCHC 32.6 03/01/2023 1049   MCHC 32.4 09/22/2022 0902   RDW 13.2 03/01/2023 1049   Iron Studies No results found for: "IRON", "TIBC", "FERRITIN", "IRONPCTSAT" Lipid Panel     Component Value Date/Time   CHOL 120 03/01/2023 1049   TRIG 61 03/01/2023 1049   HDL 50  03/01/2023 1049   CHOLHDL 3 09/09/2022 0927   VLDL 13.6 09/09/2022 0927   LDLCALC 57 03/01/2023 1049   Hepatic Function Panel     Component Value Date/Time   PROT 6.4 03/01/2023 1049   ALBUMIN 4.3 03/01/2023 1049   AST 15 03/01/2023 1049   ALT 15 03/01/2023 1049   ALKPHOS 63 03/01/2023 1049   BILITOT 0.9 03/01/2023 1049   BILIDIR 0.2 06/07/2014 0909      Component Value Date/Time   TSH 0.62 09/09/2022 0927   Nutritional Lab Results  Component Value Date   VD25OH 67.8 03/01/2023   VD25OH 77.43 09/09/2022   VD25OH 70.37 03/05/2022     Assessment and Plan    Obesity He has lost two more pounds and follows the category three diet plan about half the time. He struggles to meet protein intake goals and has difficulty with lunch and dinner due to apathy. He experiences cold intolerance, which is common during weight loss. He does yard work for exercise but avoids walking due to knee pain and fear of falling. - Continue category three diet plan - Ensure easy access to high-protein snacks - Monitor weight loss and cold intolerance - Encourage gradual increase in walking around the house   DMII he has been working on improving his DM with diet and exercise, no episodes of hypoglycemia -refill metformin and Monjara  Vitamin D Deficiency He needs a refill for vitamin D. His most recent vitamin D level was 6. - Refill vitamin D for 90 days  Vitamin B12 Deficiency His B12 levels were slightly low. He is currently taking 1000 mcg daily. - Continue B12 supplementation at 1000 mcg daily - Recheck B12 levels in April or May  General Health Maintenance He maintains a BMI of 31 and has set a weight goal of 200 lbs. Emphasized the importance of fat percentage, visceral fat rating, and muscle mass over total weight. Discussed the need for strength training exercises to improve muscle mass and reduce fat percentage. - Continue monthly visits - Schedule next visit in the morning for  fasting labs - Encourage strength training exercises - Monitor fat percentage, visceral fat rating, and muscle mass  Follow-up - Schedule next visit in April - Adjust future appointments to ensure fasting labs.        He was informed of the importance of frequent follow up visits to maximize his success with intensive lifestyle modifications for his multiple health conditions.    Quillian Quince, MD

## 2023-05-26 ENCOUNTER — Other Ambulatory Visit (INDEPENDENT_AMBULATORY_CARE_PROVIDER_SITE_OTHER): Payer: Self-pay | Admitting: Family Medicine

## 2023-05-26 DIAGNOSIS — Z794 Long term (current) use of insulin: Secondary | ICD-10-CM

## 2023-05-30 ENCOUNTER — Encounter (INDEPENDENT_AMBULATORY_CARE_PROVIDER_SITE_OTHER): Payer: Self-pay | Admitting: Family Medicine

## 2023-05-30 ENCOUNTER — Other Ambulatory Visit (INDEPENDENT_AMBULATORY_CARE_PROVIDER_SITE_OTHER): Payer: Self-pay | Admitting: Family Medicine

## 2023-05-30 DIAGNOSIS — E1169 Type 2 diabetes mellitus with other specified complication: Secondary | ICD-10-CM

## 2023-05-30 NOTE — Telephone Encounter (Signed)
 LAST APPOINTMENT DATE: 05/23/2023 NEXT APPOINTMENT DATE: 07/27/2023   [facility] - [place], [place] - [address] [place] [address] Phone: [phone] Fax: [phone]  [facility] - [place], [place] - [address] [place] [address] Phone: [phone] Fax: [phone]  Patient is requesting a refill of the following medications: Requested Prescriptions   Pending Prescriptions Disp Refills   metFORMIN (GLUCOPHAGE) 500 MG tablet 90 tablet 0    Sig: Take 1 tablet (500 mg total) by mouth daily with breakfast.    Date last filled: 12/21/2022 Previously prescribed by Dr Dalbert Garnet  Lab Results  Component Value Date   HGBA1C 5.3 03/01/2023   HGBA1C 5.4 09/09/2022   HGBA1C 5.3 03/05/2022   Lab Results  Component Value Date   MICROALBUR <0.7 03/11/2023   LDLCALC 57 03/01/2023   CREATININE 1.06 03/01/2023   Lab Results  Component Value Date   VD25OH 67.8 03/01/2023   VD25OH 77.43 09/09/2022   VD25OH 70.37 03/05/2022    BP Readings from Last 3 Encounters:  05/23/23 117/77  04/13/23 117/71  03/29/23 (!) 151/89

## 2023-06-08 ENCOUNTER — Encounter: Payer: TRICARE For Life (TFL) | Admitting: Internal Medicine

## 2023-06-21 ENCOUNTER — Encounter: Payer: TRICARE For Life (TFL) | Admitting: Internal Medicine

## 2023-06-21 ENCOUNTER — Encounter (INDEPENDENT_AMBULATORY_CARE_PROVIDER_SITE_OTHER): Payer: Self-pay | Admitting: Family Medicine

## 2023-06-21 DIAGNOSIS — E1169 Type 2 diabetes mellitus with other specified complication: Secondary | ICD-10-CM

## 2023-06-27 MED ORDER — MOUNJARO 7.5 MG/0.5ML ~~LOC~~ SOAJ: 7.5000 mg | SUBCUTANEOUS | 0 refills | Status: DC

## 2023-06-27 NOTE — Telephone Encounter (Signed)
 yes

## 2023-06-28 ENCOUNTER — Encounter (INDEPENDENT_AMBULATORY_CARE_PROVIDER_SITE_OTHER): Payer: Self-pay | Admitting: Family Medicine

## 2023-06-28 ENCOUNTER — Ambulatory Visit (INDEPENDENT_AMBULATORY_CARE_PROVIDER_SITE_OTHER): Payer: Medicare Other | Admitting: Family Medicine

## 2023-06-28 VITALS — BP 123/67 | HR 62 | Temp 97.7°F | Ht 68.0 in | Wt 202.0 lb

## 2023-06-28 DIAGNOSIS — Z6830 Body mass index (BMI) 30.0-30.9, adult: Secondary | ICD-10-CM

## 2023-06-28 DIAGNOSIS — E559 Vitamin D deficiency, unspecified: Secondary | ICD-10-CM | POA: Diagnosis not present

## 2023-06-28 DIAGNOSIS — E669 Obesity, unspecified: Secondary | ICD-10-CM | POA: Diagnosis not present

## 2023-06-28 DIAGNOSIS — E1169 Type 2 diabetes mellitus with other specified complication: Secondary | ICD-10-CM

## 2023-06-28 DIAGNOSIS — Z7984 Long term (current) use of oral hypoglycemic drugs: Secondary | ICD-10-CM | POA: Diagnosis not present

## 2023-06-28 DIAGNOSIS — E538 Deficiency of other specified B group vitamins: Secondary | ICD-10-CM | POA: Insufficient documentation

## 2023-06-28 DIAGNOSIS — E119 Type 2 diabetes mellitus without complications: Secondary | ICD-10-CM | POA: Diagnosis not present

## 2023-06-28 DIAGNOSIS — E66813 Obesity, class 3: Secondary | ICD-10-CM

## 2023-06-28 DIAGNOSIS — Z7985 Long-term (current) use of injectable non-insulin antidiabetic drugs: Secondary | ICD-10-CM

## 2023-06-28 MED ORDER — VITAMIN D (ERGOCALCIFEROL) 1.25 MG (50000 UNIT) PO CAPS
ORAL_CAPSULE | ORAL | 0 refills | Status: AC
Start: 2023-06-28 — End: ?

## 2023-06-28 NOTE — Progress Notes (Signed)
 Office: [phone]  /  Fax: [phone]  WEIGHT SUMMARY AND BIOMETRICS  Anthropometric Measurements Height: 5\' 8"  (1.727 m) Weight: 202 lb (91.6 kg) BMI (Calculated): 30.72 Weight at Last Visit: 204 lb Weight Lost Since Last Visit: 2 lb Weight Gained Since Last Visit: 0 Starting Weight: 288 lb Total Weight Loss (lbs): 86 lb (39 kg) Peak Weight: 288 lb   Body Composition  Body Fat %: 37.2 % Fat Mass (lbs): 75.4 lbs Muscle Mass (lbs): 121 lbs Total Body Water (lbs): 93.2 lbs Visceral Fat Rating : 22   Other Clinical Data Fasting: yes Labs: no Today's Visit #: 70 Starting Date: 12/14/17    Chief Complaint: OBESITY   History of Present Illness The patient, with obesity and type 2 diabetes, presents for obesity treatment plan assessment and progress evaluation.  He is following an obesity treatment plan with a category three eating plan, achieving about 60% adherence. He has increased physical activity through yard work and leg strengthening exercises, resulting in a weight loss of two pounds over the past month.  He has type 2 diabetes managed with diet, exercise, significant weight loss, metformin, and Mounjaro. He temporarily stopped Mounjaro due to a scheduled colonoscopy, which was canceled due to a cold, but has since resumed it, adjusting the timing of his doses to every Monday at 5 PM.  He has been experiencing cold symptoms for almost two weeks, suggestive of bronchitis, but denies fever. He believes he can manage without antibiotics.  He was scheduled for a routine colonoscopy, which was postponed due to his cold. He has a history of adenomas found in previous colonoscopies, leading to a three-year follow-up schedule. Recent colonoscopies have shown no concerning findings.  He reports frequent urination, especially after consuming diet decaffeinated tea with artificial sweeteners, and has reduced his tea intake to manage this symptom.  His diet includes  drinking a protein shake in the morning and occasionally eating peanut butter and jelly or cheese crackers. He notes feeling tired after fishing all day with minimal food intake.  He discusses his sister's health, noting her memory issues and refusal to take medications or eat certain foods. She is a breast cancer survivor with osteoarthritis, but no high blood pressure or diabetes. He expresses concern about her cognitive decline and its impact on her husband. He also mentions his wife's emotional eating and stress related to family and job changes, noting her recent job loss and subsequent offer from a new company.      PHYSICAL EXAM:  Blood pressure 123/67, pulse 62, temperature 97.7 F (36.5 C), height 5\' 8"  (1.727 m), weight 202 lb (91.6 kg), SpO2 99%. Body mass index is 30.71 kg/m.  DIAGNOSTIC DATA REVIEWED:  BMET    Component Value Date/Time   NA 141 03/01/2023 1049   K 4.6 03/01/2023 1049   CL 102 03/01/2023 1049   CO2 24 03/01/2023 1049   GLUCOSE 77 03/01/2023 1049   GLUCOSE 97 09/22/2022 0902   BUN 21 03/01/2023 1049   CREATININE 1.06 03/01/2023 1049   CREATININE 0.93 04/23/2016 1633   CALCIUM 9.2 03/01/2023 1049   GFRNONAA >60 09/22/2022 0902   GFRAA 74 10/22/2019 1243   Lab Results  Component Value Date   HGBA1C 5.3 03/01/2023   HGBA1C 5.5 03/05/2015   Lab Results  Component Value Date   INSULIN 4.5 03/01/2023   INSULIN 21.3 12/14/2017   Lab Results  Component Value Date   TSH 0.62 09/09/2022   CBC  Component Value Date/Time   WBC 6.1 03/01/2023 1049   WBC 6.6 09/22/2022 0902   RBC 4.86 03/01/2023 1049   RBC 4.99 09/22/2022 0902   HGB 15.1 03/01/2023 1049   HCT 46.3 03/01/2023 1049   PLT 191 03/01/2023 1049   MCV 95 03/01/2023 1049   MCH 31.1 03/01/2023 1049   MCH 31.1 09/22/2022 0902   MCHC 32.6 03/01/2023 1049   MCHC 32.4 09/22/2022 0902   RDW 13.2 03/01/2023 1049   Iron Studies No results found for: "IRON", "TIBC", "FERRITIN",  "IRONPCTSAT" Lipid Panel     Component Value Date/Time   CHOL 120 03/01/2023 1049   TRIG 61 03/01/2023 1049   HDL 50 03/01/2023 1049   CHOLHDL 3 09/09/2022 0927   VLDL 13.6 09/09/2022 0927   LDLCALC 57 03/01/2023 1049   Hepatic Function Panel     Component Value Date/Time   PROT 6.4 03/01/2023 1049   ALBUMIN 4.3 03/01/2023 1049   AST 15 03/01/2023 1049   ALT 15 03/01/2023 1049   ALKPHOS 63 03/01/2023 1049   BILITOT 0.9 03/01/2023 1049   BILIDIR 0.2 06/07/2014 0909      Component Value Date/Time   TSH 0.62 09/09/2022 0927   Nutritional Lab Results  Component Value Date   VD25OH 67.8 03/01/2023   VD25OH 77.43 09/09/2022   VD25OH 70.37 03/05/2022     Assessment and Plan Assessment & Plan Type 2 Diabetes Mellitus His type 2 diabetes is well-managed with diet, exercise, significant weight loss, metformin, and Mounjaro. Blood glucose control is stable with no complications such as nephropathy, as indicated by a normal urine microalbumin ratio. - Continue current diabetes management plan including diet, exercise, metformin, and Mounjaro. - Check A1c, kidney function, and liver function.  Obesity He is actively pursuing weight loss, adhering to a category three eating plan approximately 60% of the time, and increasing physical activity through yard work and leg strengthening exercises. He has lost two pounds in the last month, attributed to fat loss. He is on Mounjaro with a weekly dosing schedule, experiencing minimal side effects. He was advised that returning to the original dosing schedule could cause nausea due to stronger effects. - Continue category three eating plan. - Continue exercise regimen. - Continue Mounjaro with weekly dosing schedule.  Vitamin B12 Deficiency His last B12 level was 316, below the desired level of 500. He is currently fasting, and a repeat B12 level will be checked. - Check B12 level.  Vitamin D Deficiency He is taking vitamin D  supplements, with the last level at 67, within the normal range. - Refill vitamin D prescription as needed.  General Health Maintenance He canceled a scheduled colonoscopy due to a prolonged cold and possible bronchitis. With a history of adenomas, he is on a three-year surveillance schedule. Previous colonoscopies have been normal. He is advised to reschedule the colonoscopy. - Reschedule colonoscopy.  Follow-up He is advised to schedule follow-up appointments due to the provider's busy schedule. - Schedule follow-up appointment in May. - Complete lab work including B12, vitamin D, A1c, kidney, and liver function tests.      He was informed of the importance of frequent follow up visits to maximize his success with intensive lifestyle modifications for his multiple health conditions.    Quillian Quince, MD

## 2023-06-29 LAB — CMP14+EGFR
ALT: 10 IU/L (ref 0–44)
AST: 13 IU/L (ref 0–40)
Albumin: 4.5 g/dL (ref 3.8–4.8)
Alkaline Phosphatase: 84 IU/L (ref 44–121)
BUN/Creatinine Ratio: 16 (ref 10–24)
BUN: 16 mg/dL (ref 8–27)
Bilirubin Total: 0.7 mg/dL (ref 0.0–1.2)
CO2: 23 mmol/L (ref 20–29)
Calcium: 9.6 mg/dL (ref 8.6–10.2)
Chloride: 103 mmol/L (ref 96–106)
Creatinine, Ser: 1.01 mg/dL (ref 0.76–1.27)
Globulin, Total: 1.9 g/dL (ref 1.5–4.5)
Glucose: 74 mg/dL (ref 70–99)
Potassium: 4.8 mmol/L (ref 3.5–5.2)
Sodium: 141 mmol/L (ref 134–144)
Total Protein: 6.4 g/dL (ref 6.0–8.5)
eGFR: 77 mL/min/{1.73_m2} (ref >59)

## 2023-06-29 LAB — LIPID PANEL WITH LDL/HDL RATIO
Cholesterol, Total: 118 mg/dL (ref 100–199)
HDL: 45 mg/dL (ref >39)
LDL Chol Calc (NIH): 58 mg/dL (ref 0–99)
LDL/HDL Ratio: 1.3 ratio (ref 0.0–3.6)
Triglycerides: 70 mg/dL (ref 0–149)
VLDL Cholesterol Cal: 15 mg/dL (ref 5–40)

## 2023-06-29 LAB — VITAMIN B12: Vitamin B-12: 1536 pg/mL — ABNORMAL HIGH (ref 232–1245)

## 2023-06-29 LAB — HEMOGLOBIN A1C
Est. average glucose Bld gHb Est-mCnc: 105 mg/dL
Hgb A1c MFr Bld: 5.3 % (ref 4.8–5.6)

## 2023-06-29 LAB — INSULIN, RANDOM: INSULIN: 3.6 u[IU]/mL (ref 2.6–24.9)

## 2023-06-29 LAB — VITAMIN D 25 HYDROXY (VIT D DEFICIENCY, FRACTURES): Vit D, 25-Hydroxy: 85.3 ng/mL (ref 30.0–100.0)

## 2023-07-04 ENCOUNTER — Encounter (INDEPENDENT_AMBULATORY_CARE_PROVIDER_SITE_OTHER): Payer: Self-pay | Admitting: Family Medicine

## 2023-07-12 NOTE — Progress Notes (Unsigned)
 Eric [name]

## 2023-07-13 ENCOUNTER — Ambulatory Visit (INDEPENDENT_AMBULATORY_CARE_PROVIDER_SITE_OTHER): Payer: Medicare Other | Admitting: Family Medicine

## 2023-07-13 ENCOUNTER — Encounter: Payer: Self-pay | Admitting: Family Medicine

## 2023-07-13 VITALS — BP 115/70 | HR 64 | Ht 68.0 in | Wt 216.2 lb

## 2023-07-13 DIAGNOSIS — G250 Essential tremor: Secondary | ICD-10-CM

## 2023-07-13 DIAGNOSIS — G4733 Obstructive sleep apnea (adult) (pediatric): Secondary | ICD-10-CM

## 2023-07-13 NOTE — Progress Notes (Signed)
 PATIENT: Eric [name] DOB: 1945/07/05  REASON FOR VISIT: follow up HISTORY FROM: patient  Chief Complaint  Patient presents with   Follow-up    Pt in room 1.alone. Here for cpap follow up. Pt reports doing well on cpap. Pt reports tremors are the same, tremors have not worsened.      HISTORY OF PRESENT ILLNESS:  07/13/23 ALL:  Eric [name] is a 78 y.o. male here today for follow up for OSA on CPAP.  He continues to do well on therapy. He uses CPAP nightly for about 8-9 hours, on average. He denies concerns with machine or supplies. Tremor is stable. Worse when cold or anxious. He does not feel it is overly bothersome at this time.     HISTORY: (copied from Dr Dail Drought previous note)  Mr. Eric [name] he is a 78 year old right-handed gentleman with an complex medical history of hypertension, hyperlipidemia, insulin  resistance, osteoarthritis, palpitations, allergies, and tremors, chronic low back pain, BPH, sleep apnea, vitamin D  deficiency, edema, and obesity, who presents for consultation of his OSA and essential tremor. I last saw him on 06/30/2021, at which time he was compliant with his autoPAP. He had received a new machine in February 2023. He was working on weight loss. He had a stable tremor.   Today, 07/07/22: I reviewed his autoPAP compliance data from 06/06/22 through 07/05/2022, which is a total of 30 days, during which time he used his machine every night with percent use days greater than 4 hours at 100%, indicating superb compliance, average usage of 8 hours and 37 minutes, residual AHI at goal at 0.2/h, leak acceptable with the 95th percentile at 16.5 L/min, leak improved in the past 9 days.  Average pressure for the 95th percentile at 8.1 cm on a pressure range of 7 to 13 cm with EPR of 1. He reports doing well, tremor is more or less stable but does get worse when he is cold or nervous or anxious.  He is bothered by the teeth chattering and also when he tries to pour  liquids, he will spill at times.  He has been doing well otherwise.  He tries to stay active but is limited secondary to arthritis. He continues to work on weight loss and has lost at least 15 pounds since our last visit.  Interestingly, his average AutoPap pressure has gone down also probably secondary to weight loss, last year around this time his average AutoPap pressure was 9.5 cm.  His Epworth sleepiness score is 13 out of 24 today.   REVIEW OF SYSTEMS: Out of a complete 14 system review of symptoms, the patient complains only of the following symptoms, none and all other reviewed systems are negative.  ESS: 8/24  ALLERGIES: Allergies  Allergen Reactions   Lisinopril  Cough    HOME MEDICATIONS: Outpatient Medications Prior to Visit  Medication Sig Dispense Refill   amoxicillin  (AMOXIL ) 500 MG capsule 4 po prior to procedure 30 capsule 3   aspirin  EC 81 MG tablet Take 1 tablet (81 mg total) by mouth 2 (two) times daily. (Patient taking differently: Take 81 mg by mouth at bedtime.) 60 tablet 0   atorvastatin  (LIPITOR) 40 MG tablet Take 1 tablet (40 mg total) by mouth daily. 90 tablet 1   calcium  carbonate (TUMS - DOSED IN MG ELEMENTAL CALCIUM ) 500 MG chewable tablet Chew 1,000 mg by mouth daily as needed for indigestion or heartburn.     clindamycin  (CLEOCIN -T) 1 % lotion Apply topically 2 (  two) times daily. (Patient taking differently: Apply 1 Application topically 2 (two) times daily as needed (infected hair follicles).) 60 mL 2   Coenzyme Q10 (COQ10) 200 MG CAPS Take 200 mg by mouth at bedtime.     Cyanocobalamin  (B-12) 1000 MCG CAPS Take 1 tablet by mouth daily.     famotidine (PEPCID) 20 MG tablet Take 20 mg by mouth at bedtime.     fluocinonide  ointment (LIDEX ) 0.05 % Apply 1 application topically daily as needed (for bites). 30 g 3   fluticasone  (FLONASE ) 50 MCG/ACT nasal spray Place 2 sprays into both nostrils daily as needed for allergies.      Ketotifen Fumarate (ALLERGY EYE  DROPS OP) Place 1 drop into both eyes daily as needed (allergies).     loratadine  (CLARITIN ) 10 MG tablet Take 10 mg by mouth daily.     Magnesium  250 MG TABS Take 250 mg by mouth daily.     meloxicam  (MOBIC ) 15 MG tablet Take 1 tablet (15 mg total) by mouth daily. 90 tablet 3   metFORMIN  (GLUCOPHAGE ) 500 MG tablet TAKE ONE TABLET BY MOUTH DAILY WITH BREAKFAST 90 tablet 0   pantoprazole  (PROTONIX ) 40 MG tablet Take 1 tablet (40 mg total) by mouth daily. 90 tablet 1   SUFLAVE  178.7 g SOLR Take by mouth.     tamsulosin  (FLOMAX ) 0.4 MG CAPS capsule Take 1 capsule (0.4 mg total) by mouth daily after supper. 90 capsule 1   tirzepatide  (MOUNJARO ) 7.5 MG/0.5ML Pen Inject 7.5 mg into the skin once a week. 6 mL 0   valsartan  (DIOVAN ) 80 MG tablet Take 1 tablet (80 mg total) by mouth daily. 90 tablet 1   Vitamin D , Ergocalciferol , (DRISDOL ) 1.25 MG (50000 UNIT) CAPS capsule TAKE ONE CAPSULE BY MOUTH EVERY 7 DAYS 12 capsule 0   No facility-administered medications prior to visit.    PAST MEDICAL HISTORY: Past Medical History:  Diagnosis Date   Alcohol abuse    Allergy    Back pain    Cancer (HCC)    Basal cell cancer  below left eye, had Mohs'   Cataract    forming   Colon polyps    Diverticulosis of colon    DJD (degenerative joint disease)    Dysrhythmia    Palpations from caffiene- reports he was having PACs due to slim fast wth caffeine - had full cardiac w/u and was told to cut back on caffeine;  now drink decaf bevrerages and no rpeort of recurrence    Enlarged prostate    GERD (gastroesophageal reflux disease)    Hearing loss    has HAs,   Hip osteoarthritis    Left   History of hay fever    Hx of cardiovascular stress test    Lexiscan  Myoview (04/2013):  No ischemia, EF 61%; low risk   Hyperlipemia    Hypertension    Insulin  resistance    om metfromin    Internal hemorrhoids without mention of complication    Leg edema    Neuroma of foot    left   Obesity     Osteoarthrosis, unspecified whether generalized or localized, unspecified site    Palpitations    Pneumonia    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   Pre-diabetes    Primary localized osteoarthrosis of right shoulder 05/25/2016   Recent weight loss    75lbs   Seasonal allergies    Sleep apnea    CPAP   Thyroid  nodule  calcified thyroid  nodules- bx negative    Tremor of both hands     PAST SURGICAL HISTORY: Past Surgical History:  Procedure Laterality Date   COLONOSCOPY     INGUINAL HERNIA REPAIR Left 03/29/2023   Procedure: OPEN LEFT INGUINAL HERNIA REPAIR WITH MESH;  Surgeon: Kinsinger, Alphonso Aschoff, MD;  Location: [facility];  Service: General;  Laterality: Left;   JOINT REPLACEMENT  08-01-2016   POLYPECTOMY     SHOULDER SURGERY  2007/08/02   right   TONSILLECTOMY     TOTAL HIP ARTHROPLASTY Left 03/28/2017   Procedure: TOTAL HIP ARTHROPLASTY ANTERIOR APPROACH;  Surgeon: Wendolyn Hamburger, MD;  Location: [facility];  Service: Orthopedics;  Laterality: Left;   TOTAL HIP ARTHROPLASTY Right 09/25/2018   Procedure: Right Anterior Hip Arthroplasty;  Surgeon: Wendolyn Hamburger, MD;  Location: [facility];  Service: Orthopedics;  Laterality: Right;   TOTAL KNEE ARTHROPLASTY Right 10/04/2022   Procedure: RIGHT TOTAL KNEE ARTHROPLASTY;  Surgeon: Wendolyn Hamburger, MD;  Location: [facility];  Service: Orthopedics;  Laterality: Right;   TOTAL SHOULDER ARTHROPLASTY Left 10/14/2015   Procedure: LEFT TOTAL SHOULDER ARTHROPLASTY;  Surgeon: Osa Blase, MD;  Location: [facility];  Service: Orthopedics;  Laterality: Left;   TOTAL SHOULDER ARTHROPLASTY Right 05/25/2016   Procedure: TOTAL SHOULDER ARTHROPLASTY;  Surgeon: Osa Blase, MD;  Location: [facility];  Service: Orthopedics;  Laterality: Right;    FAMILY HISTORY: Family History  Problem Relation Age of Onset   Arthritis Mother    Cancer Mother 90       breast   Hyperlipidemia Mother    Colon polyps Mother 53   Hypertension Mother    Heart disease Mother     Depression Mother    Anxiety disorder Mother    Obesity Mother    Breast cancer Mother    Heart disease Father        CAD--passed away 02-Aug-2011 age 31   Hyperlipidemia Father    Hypertension Father    Obesity Father    Diabetes Father    Depression Father    Sleep apnea Father    Cancer Sister 35       breast   Breast cancer Sister    Sleep apnea Sister    Cancer Brother 92       prostate   Prostate cancer Brother    Atrial fibrillation Brother    Tremor Paternal Grandfather    Breast cancer Other    Prostate cancer Other    Colon cancer Neg Hx    Esophageal cancer Neg Hx    Rectal cancer Neg Hx    Stomach cancer Neg Hx     SOCIAL HISTORY: Social History   Socioeconomic History   Marital status: Married    Spouse name: Amara Manalang    Number of children: 3   Years of education: Not on file   Highest education level: Professional school degree (e.g., MD, DDS, DVM, JD)  Occupational History   Occupation: Retired--[employer], [employer]  Tobacco Use   Smoking status: Never   Smokeless tobacco: Never  Vaping Use   Vaping status: Never Used  Substance and Sexual Activity   Alcohol use: Yes    Alcohol/week: 3.0 standard drinks of alcohol    Types: 1 Glasses of wine, 2 Cans of beer per week   Drug use: No   Sexual activity: Yes    Partners: Female  Other Topics Concern   Not on file  Social History Narrative   Lives with wife and  daughter and two grandchildren.    Social Drivers of Corporate investment banker Strain: Low Risk  (03/04/2023)   Overall Financial Resource Strain (CARDIA)    Difficulty of Paying Living Expenses: Not hard at all  Food Insecurity: No Food Insecurity (03/04/2023)   Hunger Vital Sign    Worried About Running Out of Food in the Last Year: Never true    Ran Out of Food in the Last Year: Never true  Transportation Needs: No Transportation Needs (03/04/2023)   PRAPARE - Administrator, Civil Service (Medical): No    Lack of  Transportation (Non-Medical): No  Physical Activity: Insufficiently Active (03/04/2023)   Exercise Vital Sign    Days of Exercise per Week: 2 days    Minutes of Exercise per Session: 30 min  Stress: No Stress Concern Present (03/04/2023)   [name] of Occupational Health - Occupational Stress Questionnaire    Feeling of Stress : Not at all  Social Connections: Moderately Integrated (03/04/2023)   Social Connection and Isolation Panel [NHANES]    Frequency of Communication with Friends and Family: Once a week    Frequency of Social Gatherings with Friends and Family: Once a week    Attends Religious Services: More than 4 times per year    Active Member of [employer] or Organizations: Yes    Attends Banker Meetings: More than 4 times per year    Marital Status: Married  Catering manager Violence: Not At Risk (08/05/2021)   Humiliation, Afraid, Rape, and Kick questionnaire    Fear of Current or Ex-Partner: No    Emotionally Abused: No    Physically Abused: No    Sexually Abused: No     PHYSICAL EXAM  Vitals:   07/13/23 0941  BP: 115/70  Pulse: 64  SpO2: 98%  Weight: 216 lb 3.2 oz (98.1 kg)  Height: 5\' 8"  (1.727 m)   Body mass index is 32.87 kg/m.  Generalized: Well developed, in no acute distress  Cardiology: normal rate and rhythm, no murmur noted Respiratory: clear to auscultation bilaterally  Neurological examination  Mentation: Alert oriented to time, place, history taking. Follows all commands speech and language fluent Cranial nerve II-XII: Pupils were equal round reactive to light. Extraocular movements were full, visual field were full  Motor: The motor testing reveals 5 over 5 strength of all 4 extremities. Good symmetric motor tone is noted throughout.  Gait and station: Gait is normal.    DIAGNOSTIC DATA (LABS, IMAGING, TESTING) - I reviewed patient records, labs, notes, testing and imaging myself where available.     04/23/2016    4:00 PM   MMSE - Mini Mental State Exam  Orientation to time 5  Orientation to Place 5  Registration 3  Attention/ Calculation 5  Recall 3  Language- name 2 objects 2  Language- repeat 1  Language- follow 3 step command 3  Language- read & follow direction 1  Write a sentence 1  Copy design 1  Total score 30     Lab Results  Component Value Date   WBC 6.1 03/01/2023   HGB 15.1 03/01/2023   HCT 46.3 03/01/2023   MCV 95 03/01/2023   PLT 191 03/01/2023      Component Value Date/Time   NA 141 06/28/2023 1042   K 4.8 06/28/2023 1042   CL 103 06/28/2023 1042   CO2 23 06/28/2023 1042   GLUCOSE 74 06/28/2023 1042   GLUCOSE 97 09/22/2022 0902  BUN 16 06/28/2023 1042   CREATININE 1.01 06/28/2023 1042   CREATININE 0.93 04/23/2016 1633   CALCIUM  9.6 06/28/2023 1042   PROT 6.4 06/28/2023 1042   ALBUMIN 4.5 06/28/2023 1042   AST 13 06/28/2023 1042   ALT 10 06/28/2023 1042   ALKPHOS 84 06/28/2023 1042   BILITOT 0.7 06/28/2023 1042   GFRNONAA >60 09/22/2022 0902   GFRAA 74 10/22/2019 1243   Lab Results  Component Value Date   CHOL 118 06/28/2023   HDL 45 06/28/2023   LDLCALC 58 06/28/2023   TRIG 70 06/28/2023   CHOLHDL 3 09/09/2022   Lab Results  Component Value Date   HGBA1C 5.3 06/28/2023   Lab Results  Component Value Date   VITAMINB12 1,536 (H) 06/28/2023   Lab Results  Component Value Date   TSH 0.62 09/09/2022     ASSESSMENT AND PLAN 78 y.o. year old male  has a past medical history of Alcohol abuse, Allergy, Back pain, Cancer (HCC), Cataract, Colon polyps, Diverticulosis of colon, DJD (degenerative joint disease), Dysrhythmia, Enlarged prostate, GERD (gastroesophageal reflux disease), Hearing loss, Hip osteoarthritis, History of hay fever, cardiovascular stress test, Hyperlipemia, Hypertension, Insulin  resistance, Internal hemorrhoids without mention of complication, Leg edema, Neuroma of foot, Obesity, Osteoarthrosis, unspecified whether generalized or localized,  unspecified site, Palpitations, Pneumonia, Post-traumatic arthrosis of left shoulder (10/14/2015), Pre-diabetes, Primary localized osteoarthrosis of right shoulder (05/25/2016), Recent weight loss, Seasonal allergies, Sleep apnea, Thyroid  nodule, and Tremor of both hands. here with     ICD-10-CM   1. OSA on CPAP  G47.33 For home use only DME continuous positive airway pressure (CPAP)        Wylie Heard is doing well on CPAP therapy. Compliance report reveals excellent compliance. He was encouraged to continue using CPAP nightly and for greater than 4 hours each night. We will update supply orders as indicated. Risks of untreated sleep apnea review and education materials provided. Tremor stable. Will continue to monitor. Healthy lifestyle habits encouraged. He will follow up in 1 year, sooner if needed. He verbalizes understanding and agreement with this plan.    Orders Placed This Encounter  Procedures   For home use only DME continuous positive airway pressure (CPAP)    Heated Humidity with all supplies as needed    Length of Need:   Lifetime    Patient has OSA or probable OSA:   Yes    Is the patient currently using CPAP in the home:   Yes    Settings:   Other see comments    CPAP supplies needed:   Mask, headgear, cushions, filters, heated tubing and water  chamber     No orders of the defined types were placed in this encounter.     Terrilyn Fick, FNP-C 07/13/2023, 10:22 AM [facility] [address] [place], [place] [address] [phone]

## 2023-07-13 NOTE — Patient Instructions (Addendum)
 Please continue using your CPAP regularly. While your insurance requires that you use CPAP at least 4 hours each night on 70% of the nights, I recommend, that you not skip any nights and use it throughout the night if you can. Getting used to CPAP and staying with the treatment long term does take time and patience and discipline. Untreated obstructive sleep apnea when it is moderate to severe can have an adverse impact on cardiovascular health and raise her risk for heart disease, arrhythmias, hypertension, congestive heart failure, stroke and diabetes. Untreated obstructive sleep apnea causes sleep disruption, nonrestorative sleep, and sleep deprivation. This can have an impact on your day to day functioning and cause daytime sleepiness and impairment of cognitive function, memory loss, mood disturbance, and problems focussing. Using CPAP regularly can improve these symptoms.  We will update supply orders, today. Continue to monitor tremor. Let me know if it worsens.   Follow up in 1 year

## 2023-07-22 ENCOUNTER — Encounter

## 2023-07-26 ENCOUNTER — Encounter: Payer: Self-pay | Admitting: Family Medicine

## 2023-07-26 ENCOUNTER — Ambulatory Visit (INDEPENDENT_AMBULATORY_CARE_PROVIDER_SITE_OTHER): Admitting: Family Medicine

## 2023-07-26 VITALS — BP 102/60 | HR 74 | Temp 98.2°F | Resp 18 | Ht 68.0 in | Wt 213.6 lb

## 2023-07-26 DIAGNOSIS — J019 Acute sinusitis, unspecified: Secondary | ICD-10-CM | POA: Diagnosis not present

## 2023-07-26 MED ORDER — AZELASTINE HCL 0.1 % NA SOLN: 1.0000 | Freq: Two times a day (BID) | NASAL | 12 refills | Status: AC

## 2023-07-26 MED ORDER — PREDNISONE 10 MG PO TABS: ORAL_TABLET | ORAL | 0 refills | Status: DC

## 2023-07-26 MED ORDER — AMOXICILLIN-POT CLAVULANATE 875-125 MG PO TABS: 1.0000 | ORAL_TABLET | Freq: Two times a day (BID) | ORAL | 0 refills | Status: DC

## 2023-07-26 NOTE — Patient Instructions (Signed)

## 2023-07-26 NOTE — Progress Notes (Signed)
 Established Patient Office Visit  Subjective   Patient ID: Eric [name], male    DOB: 18-May-1945  Age: 78 y.o. MRN: [id number]  Chief Complaint  Patient presents with   Recurrent Sinusitis    Onset: 2 months     HPI Discussed the use of AI scribe software for clinical note transcription with the patient, who gave verbal consent to proceed.  History of Present Illness Eric [name] "[name]" is a 78 year old male who presents with persistent right nostril nasal discharge and tooth pain.  Two months ago, he developed a cold that led to hoarseness, causing him to cancel a colonoscopy. While the cold symptoms improved, he continued to experience sinus nasal drainage, specifically 'lines of white snot' from his right nostril.  A week ago, he began experiencing pain in the right upper tooth area. He visited his dentist, who suspected a hairline fracture in the tooth and placed a crown after sanding it down. The initial pain subsided but later returned. He informed the dentist about the nasal discharge, and the dentist prescribed amoxicillin  875 mg for a week, which decreased the discharge. However, the discharge persists, now yellow and malodorous, and he continues to blow his nose about twenty times a day.  He is currently taking over-the-counter cold medicine suitable for high blood pressure, loratadine  daily, and occasionally uses Flonase . He reports no sinus pressure and has never had a sinus infection before, though he has experienced seasonal allergies in the fall. He had a cough two months ago with bronchitis, but now only experiences an occasional cough once or twice a day, which does not disturb his sleep. No wheezing.  He has experienced some eustachian tube dysfunction but can clear it. He has not used Flonase  regularly until recently. He completed the course of amoxicillin  a week ago, and the nasal discharge remains yellow and thick. He has not experienced any significant  pressure or pain in the sinus area.   Patient Active Problem List   Diagnosis Date Noted   B12 deficiency 06/28/2023   Osteoarthritis of right knee 10/01/2022   BMI 30.0-30.9,adult 08/19/2022   BMI 34.0-34.9,adult 05/19/2022   DJD (degenerative joint disease) 05/19/2022   BMI 33.0-33.9,adult 04/21/2022   Obesity, Beginning BMI 43.79 04/21/2022   Other constipation 12/21/2021   Diabetes mellitus (HCC) 09/28/2021   Bronchitis 02/03/2021   Depression 04/02/2020   Viral upper respiratory tract infection 08/31/2019   Folliculitis 08/31/2019   Preventative health care 03/02/2019   Morbid obesity (HCC) 03/02/2019   Hypotension 10/11/2018   Status post total hip replacement, right 09/25/2018   Osteoarthritis of right hip 09/21/2018   Prediabetes 08/29/2018   Vitamin D  deficiency 08/29/2018   Primary osteoarthritis of right hip 08/29/2018   Thyroid  nodule 05/12/2017   Primary osteoarthritis of left hip 03/28/2017   Status post total replacement of left hip 03/28/2017   Osteoarthritis of left hip 03/25/2017   Essential hypertension 11/18/2016   Hyperlipidemia LDL goal <100 11/18/2016   Need for prophylactic vaccination and inoculation against influenza 11/18/2016   Primary localized osteoarthrosis of right shoulder 05/25/2016   History of total replacement of right shoulder joint 05/25/2016   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   S/P shoulder replacement 10/14/2015   Low back pain 09/03/2015   Left foot pain 04/04/2014   Left shoulder pain 02/20/2014   Palpitations 12/25/2012   Left hip pain 12/25/2012   Class 3 severe obesity with serious comorbidity and body mass index (BMI) of  40.0 to 44.9 in adult 12/25/2012   Acute bronchitis 12/25/2012   Urgency of urination 12/24/2011   Urinary frequency 12/24/2011   Preop examination 10/08/2010   Hyperlipidemia 10/08/2010   Hearing loss 09/04/2009   Internal hemorrhoids 09/04/2009   ALLERGIC RHINITIS, SEASONAL  09/04/2009   GERD 09/04/2009   Diverticulosis of colon 09/04/2009   Osteoarthritis 09/04/2009   Sleep apnea 09/04/2009   Past Medical History:  Diagnosis Date   Alcohol abuse    Allergy    Back pain    Cancer (HCC)    Basal cell cancer  below left eye, had Mohs'   Cataract    forming   Colon polyps    Diverticulosis of colon    DJD (degenerative joint disease)    Dysrhythmia    Palpations from caffiene- reports he was having PACs due to slim fast wth caffeine - had full cardiac w/u and was told to cut back on caffeine;  now drink decaf bevrerages and no rpeort of recurrence    Enlarged prostate    GERD (gastroesophageal reflux disease)    Hearing loss    has HAs,   Hip osteoarthritis    Left   History of hay fever    Hx of cardiovascular stress test    Lexiscan  Myoview (04/2013):  No ischemia, EF 61%; low risk   Hyperlipemia    Hypertension    Insulin  resistance    om metfromin    Internal hemorrhoids without mention of complication    Leg edema    Neuroma of foot    left   Obesity    Osteoarthrosis, unspecified whether generalized or localized, unspecified site    Palpitations    Pneumonia    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   Pre-diabetes    Primary localized osteoarthrosis of right shoulder 05/25/2016   Recent weight loss    75lbs   Seasonal allergies    Sleep apnea    CPAP   Thyroid  nodule    calcified thyroid  nodules- bx negative    Tremor of both hands    Past Surgical History:  Procedure Laterality Date   COLONOSCOPY     INGUINAL HERNIA REPAIR Left 03/29/2023   Procedure: OPEN LEFT INGUINAL HERNIA REPAIR WITH MESH;  Surgeon: Kinsinger, Alphonso Aschoff, MD;  Location: [facility];  Service: General;  Laterality: Left;   JOINT REPLACEMENT  2018   POLYPECTOMY     SHOULDER SURGERY  2009   right   TONSILLECTOMY     TOTAL HIP ARTHROPLASTY Left 03/28/2017   Procedure: TOTAL HIP ARTHROPLASTY ANTERIOR APPROACH;  Surgeon: Wendolyn Hamburger, MD;   Location: [facility];  Service: Orthopedics;  Laterality: Left;   TOTAL HIP ARTHROPLASTY Right 09/25/2018   Procedure: Right Anterior Hip Arthroplasty;  Surgeon: Wendolyn Hamburger, MD;  Location: [facility];  Service: Orthopedics;  Laterality: Right;   TOTAL KNEE ARTHROPLASTY Right 10/04/2022   Procedure: RIGHT TOTAL KNEE ARTHROPLASTY;  Surgeon: Wendolyn Hamburger, MD;  Location: [facility];  Service: Orthopedics;  Laterality: Right;   TOTAL SHOULDER ARTHROPLASTY Left 10/14/2015   Procedure: LEFT TOTAL SHOULDER ARTHROPLASTY;  Surgeon: Osa Blase, MD;  Location: [facility];  Service: Orthopedics;  Laterality: Left;   TOTAL SHOULDER ARTHROPLASTY Right 05/25/2016   Procedure: TOTAL SHOULDER ARTHROPLASTY;  Surgeon: Osa Blase, MD;  Location: [facility];  Service: Orthopedics;  Laterality: Right;   Social History   Tobacco Use   Smoking status: Never   Smokeless tobacco: Never  Vaping Use   Vaping status:  Never Used  Substance Use Topics   Alcohol use: Yes    Alcohol/week: 3.0 standard drinks of alcohol    Types: 1 Glasses of wine, 2 Cans of beer per week   Drug use: No   Social History   Socioeconomic History   Marital status: Married    Spouse name: Kenzy Widrick    Number of children: 3   Years of education: Not on file   Highest education level: Professional school degree (e.g., MD, DDS, DVM, JD)  Occupational History   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    Social Drivers of Corporate investment banker Strain: Low Risk  (03/04/2023)   Overall Financial Resource  Strain (CARDIA)    Difficulty of Paying Living Expenses: Not hard at all  Food Insecurity: No Food Insecurity (03/04/2023)   Hunger Vital Sign    Worried About Running Out of Food in the Last Year: Never true    Ran Out of Food in the Last Year: Never true  Transportation Needs: No Transportation Needs (03/04/2023)   PRAPARE - Administrator, Civil Service (Medical): No    Lack of Transportation (Non-Medical): No  Physical Activity: Insufficiently Active (03/04/2023)   Exercise Vital Sign    Days of Exercise per Week: 2 days    Minutes of Exercise per Session: 30 min  Stress: No Stress Concern Present (03/04/2023)   [name] of Occupational Health - Occupational Stress Questionnaire    Feeling of Stress : Not at all  Social Connections: Moderately Integrated (03/04/2023)   Social Connection and Isolation Panel [NHANES]    Frequency of Communication with Friends and Family: Once a week    Frequency of Social Gatherings with Friends and Family: Once a week    Attends Religious Services: More than 4 times per year    Active Member of [employer] or Organizations: Yes    Attends Banker Meetings: More than 4 times per year    Marital Status: Married  Catering manager Violence: Not At Risk (08/05/2021)   Humiliation, Afraid, Rape, and Kick questionnaire    Fear of Current or Ex-Partner: No    Emotionally Abused: No    Physically Abused: No    Sexually Abused: No   Family Status  Relation Name Status   Mother  Deceased   Father  Deceased at age 72       heart   Sister  Alive   Brother  Alive   PGF  (Not Specified)   Other  (Not Specified)   Neg Hx  (Not Specified)  No partnership data on file   Family History  Problem Relation Age of Onset   Arthritis Mother    Cancer Mother 49       breast   Hyperlipidemia Mother    Colon polyps Mother 44   Hypertension Mother    Heart disease Mother    Depression Mother    Anxiety disorder Mother  Obesity  Mother    Breast cancer Mother    Heart disease Father        CAD--passed away 08-21-2011 age 46   Hyperlipidemia Father    Hypertension Father    Obesity Father    Diabetes Father    Depression Father    Sleep apnea Father    Cancer Sister 91       breast   Breast cancer Sister    Sleep apnea Sister    Cancer Brother 30       prostate   Prostate cancer Brother    Atrial fibrillation Brother    Tremor Paternal Grandfather    Breast cancer Other    Prostate cancer Other    Colon cancer Neg Hx    Esophageal cancer Neg Hx    Rectal cancer Neg Hx    Stomach cancer Neg Hx    Allergies  Allergen Reactions   Lisinopril  Cough      ROS    Objective:     BP 102/60   Pulse 74   Temp 98.2 F (36.8 C)   Resp 18   Ht 5\' 8"  (1.727 m)   Wt 213 lb 9.6 oz (96.9 kg)   SpO2 99%   BMI 32.48 kg/m  BP Readings from Last 3 Encounters:  07/26/23 102/60  07/13/23 115/70  06/28/23 123/67   Wt Readings from Last 3 Encounters:  07/26/23 213 lb 9.6 oz (96.9 kg)  07/13/23 216 lb 3.2 oz (98.1 kg)  06/28/23 202 lb (91.6 kg)   SpO2 Readings from Last 3 Encounters:  07/26/23 99%  07/13/23 98%  06/28/23 99%      Physical Exam   No results found for any visits on 07/26/23.  Last CBC Lab Results  Component Value Date   WBC 6.1 03/01/2023   HGB 15.1 03/01/2023   HCT 46.3 03/01/2023   MCV 95 03/01/2023   MCH 31.1 03/01/2023   RDW 13.2 03/01/2023   PLT 191 03/01/2023   Last metabolic panel Lab Results  Component Value Date   GLUCOSE 74 06/28/2023   NA 141 06/28/2023   K 4.8 06/28/2023   CL 103 06/28/2023   CO2 23 06/28/2023   BUN 16 06/28/2023   CREATININE 1.01 06/28/2023   EGFR 77 06/28/2023   CALCIUM  9.6 06/28/2023   PROT 6.4 06/28/2023   ALBUMIN 4.5 06/28/2023   LABGLOB 1.9 06/28/2023   AGRATIO 2.0 10/22/2019   BILITOT 0.7 06/28/2023   ALKPHOS 84 06/28/2023   AST 13 06/28/2023   ALT 10 06/28/2023   ANIONGAP 7 09/22/2022   Last lipids Lab Results  Component  Value Date   CHOL 118 06/28/2023   HDL 45 06/28/2023   LDLCALC 58 06/28/2023   TRIG 70 06/28/2023   CHOLHDL 3 09/09/2022   Last hemoglobin A1c Lab Results  Component Value Date   HGBA1C 5.3 06/28/2023   Last thyroid  functions Lab Results  Component Value Date   TSH 0.62 09/09/2022   T3TOTAL 127 12/14/2017   T4TOTAL 9.2 03/05/2022   Last vitamin D  Lab Results  Component Value Date   VD25OH 85.3 06/28/2023   Last vitamin B12 and Folate Lab Results  Component Value Date   VITAMINB12 1,536 (H) 06/28/2023      The ASCVD Risk score (Arnett DK, et al., 08/20/17) failed to calculate for the following reasons:   The valid total cholesterol range is 130 to 320 mg/dL    Assessment & Plan:   Problem List Items  Addressed This Visit   None Visit Diagnoses       Acute non-recurrent sinusitis, unspecified location    -  Primary   Relevant Medications   amoxicillin -clavulanate (AUGMENTIN) 875-125 MG tablet   azelastine (ASTELIN) 0.1 % nasal spray   predniSONE  (DELTASONE ) 10 MG tablet     Assessment and Plan Assessment & Plan Sinusitis   Chronic sinusitis with right nostril drainage has persisted for over two months, changing from white and thick to yellow and malodorous. Previous amoxicillin  treatment provided some relief. Differential diagnosis includes allergies, viral infection, or bacterial sinusitis. No sinus pressure is reported. Start Augmentin for 10 days due to its broader spectrum. Add Astelin to the current Flonase  regimen to enhance nasal symptom control. Consider a sinus x-ray if symptoms do not improve and a CT scan if he persists after treatment. Prednisone  is an option if symptoms are severe.  Tooth pain due to hairline fracture   Tooth pain in the right upper area recurred after crown placement, initially treated with a crown. Amoxicillin  provided some relief. There may be a connection to sinusitis symptoms.  Eustachian tube dysfunction   Intermittent eustachian  tube dysfunction is present, with the ability to clear ears and no significant impact on current symptoms. Continue Flonase  to assist with symptom management.  Bronchitis   Bronchitis occurred two months ago, leaving a residual occasional cough. There is no current wheezing or significant cough.    No follow-ups on file.    Trysten Bernard R Lowne Chase, DO

## 2023-07-27 ENCOUNTER — Encounter (INDEPENDENT_AMBULATORY_CARE_PROVIDER_SITE_OTHER): Payer: Self-pay | Admitting: Family Medicine

## 2023-07-27 ENCOUNTER — Ambulatory Visit (INDEPENDENT_AMBULATORY_CARE_PROVIDER_SITE_OTHER): Admitting: Family Medicine

## 2023-07-27 VITALS — BP 112/65 | HR 60 | Temp 98.2°F | Ht 68.0 in | Wt 206.0 lb

## 2023-07-27 DIAGNOSIS — J019 Acute sinusitis, unspecified: Secondary | ICD-10-CM

## 2023-07-27 DIAGNOSIS — E119 Type 2 diabetes mellitus without complications: Secondary | ICD-10-CM | POA: Diagnosis not present

## 2023-07-27 DIAGNOSIS — Z6831 Body mass index (BMI) 31.0-31.9, adult: Secondary | ICD-10-CM | POA: Diagnosis not present

## 2023-07-27 DIAGNOSIS — Z794 Long term (current) use of insulin: Secondary | ICD-10-CM | POA: Diagnosis not present

## 2023-07-27 DIAGNOSIS — E669 Obesity, unspecified: Secondary | ICD-10-CM | POA: Diagnosis not present

## 2023-07-27 DIAGNOSIS — Z7984 Long term (current) use of oral hypoglycemic drugs: Secondary | ICD-10-CM

## 2023-07-27 DIAGNOSIS — E538 Deficiency of other specified B group vitamins: Secondary | ICD-10-CM

## 2023-07-27 DIAGNOSIS — B9789 Other viral agents as the cause of diseases classified elsewhere: Secondary | ICD-10-CM | POA: Diagnosis not present

## 2023-07-27 DIAGNOSIS — E1169 Type 2 diabetes mellitus with other specified complication: Secondary | ICD-10-CM

## 2023-07-27 MED ORDER — METFORMIN HCL 500 MG PO TABS: 500.0000 mg | ORAL_TABLET | Freq: Every day | ORAL | 0 refills | Status: DC

## 2023-07-27 NOTE — Progress Notes (Signed)
 Office: [phone]  /  Fax: [phone]  WEIGHT SUMMARY AND BIOMETRICS  Anthropometric Measurements Height: 5\' 8"  (1.727 m) Weight: 206 lb (93.4 kg) BMI (Calculated): 31.33 Weight at Last Visit: 202 lb Weight Lost Since Last Visit: 0 Weight Gained Since Last Visit: 4 lb Starting Weight: 288 lb Total Weight Loss (lbs): 82 lb (37.2 kg) Peak Weight: 288 lb   Body Composition  Body Fat %: 37.4 % Fat Mass (lbs): 77.2 lbs Muscle Mass (lbs): 122.6 lbs Total Body Water  (lbs): 94 lbs Visceral Fat Rating : 22   Other Clinical Data Fasting: Yes Labs: No Today's Visit #: 85 Starting Date: 12/14/17    Chief Complaint: OBESITY    History of Present Illness Eric [name] "Eric [name]" is a 78 year old male with obesity and type two diabetes who presents for obesity treatment and progress assessment.  He has difficulty adhering to his eating plan, following it only about 20% of the time, and has gained four pounds in the last month. His main form of exercise is yard work. He is taking a B12 supplement, initially daily, but has reduced it to once a week. He also takes vitamin D  every other week.  He has a history of type two diabetes and is currently on metformin . He requested a refill of his metformin  during the visit.  He describes experiencing a foul smell in his nose following a sinus infection. He had a cold about two months ago, which led to white nasal drainage and later a toothache. He initially received treatment from a dentist who sanded down a tooth and placed a cap, but the pain returned. He was then treated with amoxicillin , which resolved the white drainage but not the yellow drainage. He later developed a foul smell after blowing his nose. He saw another doctor who prescribed Augmentin and steroids, but he has not yet picked up the medication.  He mentions social activities, including visiting his sister in [place], a brother's fishing trip, and upcoming visits from  [employer] buddies, which have impacted his routine. He also discusses a planned trip to [facility] with family, which he anticipates will present dietary temptations.      PHYSICAL EXAM:  Blood pressure 112/65, pulse 60, temperature 98.2 F (36.8 C), height 5\' 8"  (1.727 m), weight 206 lb (93.4 kg), SpO2 98%. Body mass index is 31.32 kg/m.  DIAGNOSTIC DATA REVIEWED:  BMET    Component Value Date/Time   NA 141 06/28/2023 1042   K 4.8 06/28/2023 1042   CL 103 06/28/2023 1042   CO2 23 06/28/2023 1042   GLUCOSE 74 06/28/2023 1042   GLUCOSE 97 09/22/2022 0902   BUN 16 06/28/2023 1042   CREATININE 1.01 06/28/2023 1042   CREATININE 0.93 04/23/2016 1633   CALCIUM  9.6 06/28/2023 1042   GFRNONAA >60 09/22/2022 0902   GFRAA 74 10/22/2019 1243   Lab Results  Component Value Date   HGBA1C 5.3 06/28/2023   HGBA1C 5.5 03/05/2015   Lab Results  Component Value Date   INSULIN  3.6 06/28/2023   INSULIN  21.3 12/14/2017   Lab Results  Component Value Date   TSH 0.62 09/09/2022   CBC    Component Value Date/Time   WBC 6.1 03/01/2023 1049   WBC 6.6 09/22/2022 0902   RBC 4.86 03/01/2023 1049   RBC 4.99 09/22/2022 0902   HGB 15.1 03/01/2023 1049   HCT 46.3 03/01/2023 1049   PLT 191 03/01/2023 1049   MCV 95 03/01/2023 1049   MCH  31.1 03/01/2023 1049   MCH 31.1 09/22/2022 0902   MCHC 32.6 03/01/2023 1049   MCHC 32.4 09/22/2022 0902   RDW 13.2 03/01/2023 1049   Iron Studies No results found for: "IRON", "TIBC", "FERRITIN", "IRONPCTSAT" Lipid Panel     Component Value Date/Time   CHOL 118 06/28/2023 1042   TRIG 70 06/28/2023 1042   HDL 45 06/28/2023 1042   CHOLHDL 3 09/09/2022 0927   VLDL 13.6 09/09/2022 0927   LDLCALC 58 06/28/2023 1042   Hepatic Function Panel     Component Value Date/Time   PROT 6.4 06/28/2023 1042   ALBUMIN 4.5 06/28/2023 1042   AST 13 06/28/2023 1042   ALT 10 06/28/2023 1042   ALKPHOS 84 06/28/2023 1042   BILITOT 0.7 06/28/2023 1042   BILIDIR 0.2  06/07/2014 0909      Component Value Date/Time   TSH 0.62 09/09/2022 0927   Nutritional Lab Results  Component Value Date   VD25OH 85.3 06/28/2023   VD25OH 67.8 03/01/2023   VD25OH 77.43 09/09/2022     Assessment and Plan Assessment & Plan Acute sinusitis Acute sinusitis with nasal drainage and foul smell. Previously treated with amoxicillin , which was insufficient. Currently prescribed Augmentin and steroids by another provider. Discussed optional steroid use due to potential weight gain concerns. He has not yet started the medication. - Continue Augmentin as prescribed - Avoid steroids if possible due to weight concerns  Type 2 diabetes mellitus Type 2 diabetes mellitus managed with metformin . He requested a refill of metformin . No acute issues related to diabetes management. - Refill metformin  prescription - Encourage adherence to Category 3 eating plan - Promote regular physical activity  Obesity Obesity management with a category three eating plan. He reports 20% adherence and a four-pound weight gain in the last month, partially due to fluid retention and two pounds of fat gain. He is attempting exercise through yard work. Discussed steroid impact on weight gain and advised against his use if possible due to weight concerns. - Encourage adherence to Category 3 eating plan - Promote regular physical activity - Monitor weight and adjust plan as needed      He was informed of the importance of frequent follow up visits to maximize his success with intensive lifestyle modifications for his multiple health conditions.    Jasmine Mesi, MD

## 2023-07-29 ENCOUNTER — Other Ambulatory Visit: Payer: Self-pay | Admitting: Family Medicine

## 2023-07-29 DIAGNOSIS — N400 Enlarged prostate without lower urinary tract symptoms: Secondary | ICD-10-CM

## 2023-08-08 ENCOUNTER — Encounter: Payer: Self-pay | Admitting: Internal Medicine

## 2023-08-08 ENCOUNTER — Ambulatory Visit: Admitting: *Deleted

## 2023-08-08 VITALS — Ht 68.0 in | Wt 207.0 lb

## 2023-08-08 DIAGNOSIS — Z860100 Personal history of colon polyps, unspecified: Secondary | ICD-10-CM

## 2023-08-08 NOTE — Progress Notes (Signed)
 Pre visit completed over telephone. Instructions sent through MyChart. Patient had SUFLAVE  from previously cancelled colonoscopy.   No egg or soy allergy known to patient  No issues known to pt with past sedation with any surgeries or procedures Patient denies ever being told they had issues or difficulty with intubation  No FH of Malignant Hyperthermia Pt is not on diet pills Pt is not on  home 02  Pt is not on blood thinners  Pt denies issues with constipation  No A fib or A flutter Have any cardiac testing pending--NO Pt instructed to use [email or web address] or GoodRx for a price reduction on prep

## 2023-08-16 ENCOUNTER — Encounter: Admitting: Internal Medicine

## 2023-08-22 ENCOUNTER — Telehealth: Payer: Self-pay | Admitting: Internal Medicine

## 2023-08-22 NOTE — Telephone Encounter (Signed)
 Spoke with patient ok to proceed as scheduled. Pt understands if he develops SOB, GI symptoms, or fever he needs to call to be r/s

## 2023-08-22 NOTE — Telephone Encounter (Signed)
 Patient called again stating that he was placed on hold and then got disconnected. Patient is wanting to speak to the nurse regarding the information below. Patient is requesting a call back. Please advise.

## 2023-08-22 NOTE — Telephone Encounter (Signed)
 Inbound call from patient, states he is having nasal discharge and a cough, patient presents no fever, he would like to know if he can continue with procedure.

## 2023-08-24 ENCOUNTER — Encounter: Payer: Self-pay | Admitting: Internal Medicine

## 2023-08-24 ENCOUNTER — Ambulatory Visit: Admitting: Internal Medicine

## 2023-08-24 VITALS — BP 107/63 | HR 51 | Temp 98.2°F | Resp 12 | Ht 68.0 in | Wt 207.0 lb

## 2023-08-24 DIAGNOSIS — I10 Essential (primary) hypertension: Secondary | ICD-10-CM | POA: Diagnosis not present

## 2023-08-24 DIAGNOSIS — E785 Hyperlipidemia, unspecified: Secondary | ICD-10-CM | POA: Diagnosis not present

## 2023-08-24 DIAGNOSIS — Z860101 Personal history of adenomatous and serrated colon polyps: Secondary | ICD-10-CM | POA: Diagnosis not present

## 2023-08-24 DIAGNOSIS — K648 Other hemorrhoids: Secondary | ICD-10-CM

## 2023-08-24 DIAGNOSIS — K5730 Diverticulosis of large intestine without perforation or abscess without bleeding: Secondary | ICD-10-CM

## 2023-08-24 DIAGNOSIS — E669 Obesity, unspecified: Secondary | ICD-10-CM | POA: Diagnosis not present

## 2023-08-24 DIAGNOSIS — D123 Benign neoplasm of transverse colon: Secondary | ICD-10-CM | POA: Diagnosis not present

## 2023-08-24 DIAGNOSIS — Z1211 Encounter for screening for malignant neoplasm of colon: Secondary | ICD-10-CM | POA: Diagnosis not present

## 2023-08-24 DIAGNOSIS — Z860100 Personal history of colon polyps, unspecified: Secondary | ICD-10-CM

## 2023-08-24 MED ORDER — SODIUM CHLORIDE 0.9 % IV SOLN: 500.0000 mL | Freq: Once | INTRAVENOUS | Status: DC

## 2023-08-24 NOTE — Progress Notes (Signed)
 Pt's states no medical or surgical changes since previsit or office visit.

## 2023-08-24 NOTE — Progress Notes (Signed)
 Vss nad trans to pacu

## 2023-08-24 NOTE — Op Note (Signed)
 [facility] Patient Name: Eric [name] Procedure Date: 08/24/2023 10:14 AM MRN: [id number] Endoscopist: Murel Arlington. Elvin Hammer , MD, [id number] Age: 78 Referring MD:  Date of Birth: 16-May-1945 Gender: Male Account #: [id number] Procedure:                Colonoscopy with cold snare polypectomy x 1 Indications:              High risk colon cancer surveillance: Personal                            history of multiple (3 or more) adenomas. Previous                            examinations 2006, 2009, 2014, 2020 Medicines:                Monitored Anesthesia Care Procedure:                Pre-Anesthesia Assessment:                           - Prior to the procedure, a History and Physical                            was performed, and patient medications and                            allergies were reviewed. The patient's tolerance of                            previous anesthesia was also reviewed. The risks                            and benefits of the procedure and the sedation                            options and risks were discussed with the patient.                            All questions were answered, and informed consent                            was obtained. Prior Anticoagulants: The patient has                            taken no anticoagulant or antiplatelet agents. ASA                            Grade Assessment: II - A patient with mild systemic                            disease. After reviewing the risks and benefits,                            the patient was deemed in satisfactory condition to  undergo the procedure.                           After obtaining informed consent, the colonoscope                            was passed under direct vision. Throughout the                            procedure, the patient's blood pressure, pulse, and                            oxygen saturations were monitored continuously. The                             CF [id number] #[id number] was introduced through the anus                            and advanced to the the cecum, identified by                            appendiceal orifice and ileocecal valve. The                            ileocecal valve, appendiceal orifice, and rectum                            were photographed. The quality of the bowel                            preparation was excellent. The colonoscopy was                            performed without difficulty. The patient tolerated                            the procedure well. The bowel preparation used was                            SUPREP via split dose instruction. Scope In: 10:28:38 AM Scope Out: 10:46:00 AM Scope Withdrawal Time: 0 hours 12 minutes 8 seconds  Total Procedure Duration: 0 hours 17 minutes 22 seconds  Findings:                 A 2 mm polyp was found in the transverse colon. The                            polyp was removed with a cold snare. Resection and                            retrieval were complete.                           Many diverticula were found in the left colon.  Internal hemorrhoids were found during retroflexion.                           The exam was otherwise without abnormality on                            direct and retroflexion views. Complications:            No immediate complications. Estimated blood loss:                            None. Estimated Blood Loss:     Estimated blood loss: none. Impression:               - One 2 mm polyp in the transverse colon, removed                            with a cold snare. Resected and retrieved.                           - Diverticulosis in the left colon.                           - Internal hemorrhoids.                           - The examination was otherwise normal on direct                            and retroflexion views. Recommendation:           - Repeat colonoscopy is not recommended for                             surveillance.                           - Patient has a contact number available for                            emergencies. The signs and symptoms of potential                            delayed complications were discussed with the                            patient. Return to normal activities tomorrow.                            Written discharge instructions were provided to the                            patient.                           - Resume previous diet.                           -  Continue present medications.                           - Await pathology results. Murel Arlington. Elvin Hammer, MD 08/24/2023 10:57:27 AM This report has been signed electronically.

## 2023-08-24 NOTE — Progress Notes (Signed)
 HISTORY OF PRESENT ILLNESS:  Eric [name] is a 78 y.o. male with a history of multiple adenomatous colon polyps.  Presents today for surveillance colonoscopy.  No complaints  REVIEW OF SYSTEMS:  All non-GI ROS negative except for  Past Medical History:  Diagnosis Date   Alcohol abuse    Allergy    Back pain    Cancer (HCC)    Basal cell cancer  below left eye, had Mohs'   Cataract    forming   Colon polyps    Diverticulosis of colon    DJD (degenerative joint disease)    Dysrhythmia    Palpations from caffiene- reports he was having PACs due to slim fast wth caffeine - had full cardiac w/u and was told to cut back on caffeine;  now drink decaf bevrerages and no rpeort of recurrence    Enlarged prostate    GERD (gastroesophageal reflux disease)    Hearing loss    has HAs,   Hip osteoarthritis    Left   History of hay fever    Hx of cardiovascular stress test    Lexiscan  Myoview (04/2013):  No ischemia, EF 61%; low risk   Hyperlipemia    Hypertension    Insulin  resistance    om metfromin    Internal hemorrhoids without mention of complication    Leg edema    Neuroma of foot    left   Obesity    Osteoarthrosis, unspecified whether generalized or localized, unspecified site    Palpitations    Pneumonia    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   Pre-diabetes    Primary localized osteoarthrosis of right shoulder 05/25/2016   Recent weight loss    75lbs   Seasonal allergies    Sleep apnea    CPAP   Thyroid  nodule    calcified thyroid  nodules- bx negative    Tremor of both hands     Past Surgical History:  Procedure Laterality Date   COLONOSCOPY     INGUINAL HERNIA REPAIR Left 03/29/2023   Procedure: OPEN LEFT INGUINAL HERNIA REPAIR WITH MESH;  Surgeon: Kinsinger, Alphonso Aschoff, MD;  Location: [facility];  Service: General;  Laterality: Left;   JOINT REPLACEMENT  2018   POLYPECTOMY     SHOULDER SURGERY  2009   right   TONSILLECTOMY     TOTAL HIP  ARTHROPLASTY Left 03/28/2017   Procedure: TOTAL HIP ARTHROPLASTY ANTERIOR APPROACH;  Surgeon: Wendolyn Hamburger, MD;  Location: [facility];  Service: Orthopedics;  Laterality: Left;   TOTAL HIP ARTHROPLASTY Right 09/25/2018   Procedure: Right Anterior Hip Arthroplasty;  Surgeon: Wendolyn Hamburger, MD;  Location: [facility];  Service: Orthopedics;  Laterality: Right;   TOTAL KNEE ARTHROPLASTY Right 10/04/2022   Procedure: RIGHT TOTAL KNEE ARTHROPLASTY;  Surgeon: Wendolyn Hamburger, MD;  Location: [facility];  Service: Orthopedics;  Laterality: Right;   TOTAL SHOULDER ARTHROPLASTY Left 10/14/2015   Procedure: LEFT TOTAL SHOULDER ARTHROPLASTY;  Surgeon: Osa Blase, MD;  Location: [facility];  Service: Orthopedics;  Laterality: Left;   TOTAL SHOULDER ARTHROPLASTY Right 05/25/2016   Procedure: TOTAL SHOULDER ARTHROPLASTY;  Surgeon: Osa Blase, MD;  Location: [facility];  Service: Orthopedics;  Laterality: Right;    Social History Eric [name]  reports that he has never smoked. He has never used smokeless tobacco. He reports current alcohol use of about 3.0 standard drinks of alcohol per week. He reports that he does not use drugs.  family history includes Anxiety disorder in his  mother; Arthritis in his mother; Atrial fibrillation in his brother; Breast cancer in his mother, sister, and another family member; Cancer (age of onset: 26) in his sister; Cancer (age of onset: 86) in his mother; Cancer (age of onset: 73) in his brother; Colon polyps (age of onset: 60) in his mother; Depression in his father and mother; Diabetes in his father; Heart disease in his father and mother; Hyperlipidemia in his father and mother; Hypertension in his father and mother; Obesity in his father and mother; Prostate cancer in his brother and another family member; Sleep apnea in his father and sister; Tremor in his paternal grandfather.  Allergies  Allergen Reactions   Lisinopril  Cough       PHYSICAL EXAMINATION: Vital signs: BP 113/67    Pulse (!) 55   Temp 98.2 F (36.8 C)   Ht 5\' 8"  (1.727 m)   Wt 207 lb (93.9 kg)   SpO2 99%   BMI 31.47 kg/m  General: Well-developed, well-nourished, no acute distress HEENT: Sclerae are anicteric, conjunctiva pink. Oral mucosa intact Lungs: Clear Heart: Regular Abdomen: soft, nontender, nondistended, no obvious ascites, no peritoneal signs, normal bowel sounds. No organomegaly. Extremities: No edema Psychiatric: alert and oriented x3. Cooperative     ASSESSMENT:   Personal history of multiple adenomatous polyps  PLAN:  Surveillance colonoscopy

## 2023-08-24 NOTE — Patient Instructions (Addendum)
 Resume regular diet and medications.  Repeat colonoscopy not recommended at this time.  Handout provided on polyps, diverticulosis, and hemorrhoids.   YOU HAD AN ENDOSCOPIC PROCEDURE TODAY AT THE [facility]:   Refer to the procedure report that was given to you for any specific questions about what was found during the examination.  If the procedure report does not answer your questions, please call your gastroenterologist to clarify.  If you requested that your care partner not be given the details of your procedure findings, then the procedure report has been included in a sealed envelope for you to review at your convenience later.  YOU SHOULD EXPECT: Some feelings of bloating in the abdomen. Passage of more gas than usual.  Walking can help get rid of the air that was put into your GI tract during the procedure and reduce the bloating. If you had a lower endoscopy (such as a colonoscopy or flexible sigmoidoscopy) you may notice spotting of blood in your stool or on the toilet paper. If you underwent a bowel prep for your procedure, you may not have a normal bowel movement for a few days.  Please Note:  You might notice some irritation and congestion in your nose or some drainage.  This is from the oxygen used during your procedure.  There is no need for concern and it should clear up in a day or so.  SYMPTOMS TO REPORT IMMEDIATELY:  Following lower endoscopy (colonoscopy or flexible sigmoidoscopy):  Excessive amounts of blood in the stool  Significant tenderness or worsening of abdominal pains  Swelling of the abdomen that is new, acute  Fever of 100F or higher  For urgent or emergent issues, a gastroenterologist can be reached at any hour by calling [phone]. Do not use MyChart messaging for urgent concerns.    DIET:  We do recommend a small meal at first, but then you may proceed to your regular diet.  Drink plenty of fluids but you should avoid alcoholic beverages for  24 hours.  ACTIVITY:  You should plan to take it easy for the rest of today and you should NOT DRIVE or use heavy machinery until tomorrow (because of the sedation medicines used during the test).    FOLLOW UP: Our staff will call the number listed on your records the next business day following your procedure.  We will call around 7:15- 8:00 am to check on you and address any questions or concerns that you may have regarding the information given to you following your procedure. If we do not reach you, we will leave a message.     If any biopsies were taken you will be contacted by phone or by letter within the next 1-3 weeks.  Please call us  at [phone] if you have not heard about the biopsies in 3 weeks.    SIGNATURES/CONFIDENTIALITY: You and/or your care partner have signed paperwork which will be entered into your electronic medical record.  These signatures attest to the fact that that the information above on your After Visit Summary has been reviewed and is understood.  Full responsibility of the confidentiality of this discharge information lies with you and/or your care-partner.

## 2023-08-25 ENCOUNTER — Other Ambulatory Visit: Payer: Self-pay | Admitting: Family Medicine

## 2023-08-25 ENCOUNTER — Telehealth: Payer: Self-pay

## 2023-08-25 DIAGNOSIS — E785 Hyperlipidemia, unspecified: Secondary | ICD-10-CM

## 2023-08-25 NOTE — Telephone Encounter (Signed)
 Left message on follow up call.

## 2023-08-26 ENCOUNTER — Ambulatory Visit: Payer: Self-pay | Admitting: Internal Medicine

## 2023-08-26 LAB — SURGICAL PATHOLOGY

## 2023-08-29 ENCOUNTER — Other Ambulatory Visit: Payer: Self-pay | Admitting: Family Medicine

## 2023-08-29 ENCOUNTER — Ambulatory Visit (HOSPITAL_BASED_OUTPATIENT_CLINIC_OR_DEPARTMENT_OTHER)
Admission: RE | Admit: 2023-08-29 | Discharge: 2023-08-29 | Disposition: A | Discharge status: Home / Self Care | Source: Ambulatory Visit | Attending: Family Medicine | Admitting: Family Medicine

## 2023-08-29 ENCOUNTER — Ambulatory Visit (INDEPENDENT_AMBULATORY_CARE_PROVIDER_SITE_OTHER): Admitting: Family Medicine

## 2023-08-29 ENCOUNTER — Encounter: Payer: Self-pay | Admitting: Family Medicine

## 2023-08-29 VITALS — BP 108/58 | HR 73 | Temp 98.7°F | Resp 16 | Ht 68.0 in | Wt 216.0 lb

## 2023-08-29 DIAGNOSIS — J342 Deviated nasal septum: Secondary | ICD-10-CM | POA: Diagnosis not present

## 2023-08-29 DIAGNOSIS — I10 Essential (primary) hypertension: Secondary | ICD-10-CM | POA: Diagnosis not present

## 2023-08-29 DIAGNOSIS — J329 Chronic sinusitis, unspecified: Secondary | ICD-10-CM | POA: Diagnosis not present

## 2023-08-29 DIAGNOSIS — Z794 Long term (current) use of insulin: Secondary | ICD-10-CM | POA: Diagnosis not present

## 2023-08-29 DIAGNOSIS — J324 Chronic pansinusitis: Secondary | ICD-10-CM | POA: Insufficient documentation

## 2023-08-29 DIAGNOSIS — R9089 Other abnormal findings on diagnostic imaging of central nervous system: Secondary | ICD-10-CM | POA: Diagnosis not present

## 2023-08-29 DIAGNOSIS — J3489 Other specified disorders of nose and nasal sinuses: Secondary | ICD-10-CM | POA: Diagnosis not present

## 2023-08-29 DIAGNOSIS — E1169 Type 2 diabetes mellitus with other specified complication: Secondary | ICD-10-CM | POA: Diagnosis not present

## 2023-08-29 DIAGNOSIS — K219 Gastro-esophageal reflux disease without esophagitis: Secondary | ICD-10-CM

## 2023-08-29 DIAGNOSIS — E785 Hyperlipidemia, unspecified: Secondary | ICD-10-CM | POA: Diagnosis not present

## 2023-08-29 MED ORDER — MONTELUKAST SODIUM 10 MG PO TABS: 10.0000 mg | ORAL_TABLET | Freq: Every day | ORAL | 3 refills | Status: AC

## 2023-08-29 NOTE — Assessment & Plan Note (Signed)
 Encourage heart healthy diet such as MIND or DASH diet, increase exercise, avoid trans fats, simple carbohydrates and processed foods, consider a krill or fish or flaxseed oil cap daily.

## 2023-08-29 NOTE — Assessment & Plan Note (Signed)
 Well controlled, no changes to meds. Encouraged heart healthy diet such as the DASH diet and exercise as tolerated.

## 2023-08-29 NOTE — Assessment & Plan Note (Signed)
 Lab Results  Component Value Date   HGBA1C 5.3 06/28/2023   hgba1c acceptable, minimize simple carbs. Increase exercise as tolerated. Continue current meds

## 2023-08-29 NOTE — Progress Notes (Signed)
 Established Patient Office Visit  Subjective   Patient ID: Eric [name], male    DOB: 11-26-1945  Age: 78 y.o. MRN: [id number]  Chief Complaint  Patient presents with   Sinus Problem    Still having nasal congestion    HPI Discussed the use of AI scribe software for clinical note transcription with the patient, who gave verbal consent to proceed.  History of Present Illness Eric [name] "Eric [name]" is a 78 year old male who presents with persistent nasal congestion and discharge.  He has been experiencing persistent nasal congestion primarily affecting the right nostril for approximately four months. The congestion is more pronounced on the right side, accompanied by a yellow nasal discharge that has decreased in quantity but remains present. He blows his nose 10 to 15 times a day. He has a history of ragweed and goldenrod allergies and occasionally uses Afrin nasal spray for relief, which provides temporary clearing.  He mentions recent onset of chest congestion and a mild cough, which he attributes to a possible cold. The cough is worse in the morning upon waking, especially after using his CPAP machine overnight. No history of sinus infections. No wheezing.  He has been using Flonase  and Astelin  nasal sprays, although he was not consistently using the Astelin . He has been taking Claritin  for approximately 20 years, but feels it may no longer be effective. He is open to trying different antihistamines.   Patient Active Problem List   Diagnosis Date Noted   B12 deficiency 06/28/2023   Osteoarthritis of right knee 10/01/2022   BMI 31.0-31.9,adult 08/19/2022   BMI 34.0-34.9,adult 05/19/2022   DJD (degenerative joint disease) 05/19/2022   BMI 33.0-33.9,adult 04/21/2022   Obesity, Beginning BMI 43.79 04/21/2022   Other constipation 12/21/2021   Diabetes mellitus (HCC) 09/28/2021   Bronchitis 02/03/2021   Depression 04/02/2020   Viral upper respiratory tract infection  08/31/2019   Folliculitis 08/31/2019   Preventative health care 03/02/2019   Morbid obesity (HCC) 03/02/2019   Hypotension 10/11/2018   Status post total hip replacement, right 09/25/2018   Osteoarthritis of right hip 09/21/2018   Prediabetes 08/29/2018   Vitamin D  deficiency 08/29/2018   Primary osteoarthritis of right hip 08/29/2018   Thyroid  nodule 05/12/2017   Primary osteoarthritis of left hip 03/28/2017   Status post total replacement of left hip 03/28/2017   Osteoarthritis of left hip 03/25/2017   Essential hypertension 11/18/2016   Hyperlipidemia LDL goal <100 11/18/2016   Need for prophylactic vaccination and inoculation against influenza 11/18/2016   Primary localized osteoarthrosis of right shoulder 05/25/2016   History of total replacement of right shoulder joint 05/25/2016   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   S/P shoulder replacement 10/14/2015   Low back pain 09/03/2015   Left foot pain 04/04/2014   Left shoulder pain 02/20/2014   Palpitations 12/25/2012   Left hip pain 12/25/2012   Class 3 severe obesity with serious comorbidity and body mass index (BMI) of 40.0 to 44.9 in adult 12/25/2012   Acute bronchitis 12/25/2012   Urgency of urination 12/24/2011   Urinary frequency 12/24/2011   Preop examination 10/08/2010   Hyperlipidemia 10/08/2010   Hearing loss 09/04/2009   Internal hemorrhoids 09/04/2009   ALLERGIC RHINITIS, SEASONAL 09/04/2009   GERD 09/04/2009   Diverticulosis of colon 09/04/2009   Osteoarthritis 09/04/2009   Sleep apnea 09/04/2009   Past Medical History:  Diagnosis Date   Alcohol abuse    Allergy    Back pain  Cancer (HCC)    Basal cell cancer  below left eye, had Mohs'   Cataract    forming   Colon polyps    Diverticulosis of colon    DJD (degenerative joint disease)    Dysrhythmia    Palpations from caffiene- reports he was having PACs due to slim fast wth caffeine - had full cardiac w/u and was told to cut  back on caffeine;  now drink decaf bevrerages and no rpeort of recurrence    Enlarged prostate    GERD (gastroesophageal reflux disease)    Hearing loss    has HAs,   Hip osteoarthritis    Left   History of hay fever    Hx of cardiovascular stress test    Lexiscan  Myoview (04/2013):  No ischemia, EF 61%; low risk   Hyperlipemia    Hypertension    Insulin  resistance    om metfromin    Internal hemorrhoids without mention of complication    Leg edema    Neuroma of foot    left   Obesity    Osteoarthrosis, unspecified whether generalized or localized, unspecified site    Palpitations    Pneumonia    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   Pre-diabetes    Primary localized osteoarthrosis of right shoulder 05/25/2016   Recent weight loss    75lbs   Seasonal allergies    Sleep apnea    CPAP   Thyroid  nodule    calcified thyroid  nodules- bx negative    Tremor of both hands    Past Surgical History:  Procedure Laterality Date   COLONOSCOPY     INGUINAL HERNIA REPAIR Left 03/29/2023   Procedure: OPEN LEFT INGUINAL HERNIA REPAIR WITH MESH;  Surgeon: Kinsinger, Alphonso Aschoff, MD;  Location: [facility];  Service: General;  Laterality: Left;   JOINT REPLACEMENT  2018   POLYPECTOMY     SHOULDER SURGERY  2009   right   TONSILLECTOMY     TOTAL HIP ARTHROPLASTY Left 03/28/2017   Procedure: TOTAL HIP ARTHROPLASTY ANTERIOR APPROACH;  Surgeon: Wendolyn Hamburger, MD;  Location: [facility];  Service: Orthopedics;  Laterality: Left;   TOTAL HIP ARTHROPLASTY Right 09/25/2018   Procedure: Right Anterior Hip Arthroplasty;  Surgeon: Wendolyn Hamburger, MD;  Location: [facility];  Service: Orthopedics;  Laterality: Right;   TOTAL KNEE ARTHROPLASTY Right 10/04/2022   Procedure: RIGHT TOTAL KNEE ARTHROPLASTY;  Surgeon: Wendolyn Hamburger, MD;  Location: [facility];  Service: Orthopedics;  Laterality: Right;   TOTAL SHOULDER ARTHROPLASTY Left 10/14/2015   Procedure: LEFT TOTAL SHOULDER ARTHROPLASTY;  Surgeon: Osa Blase, MD;  Location: [facility];  Service: Orthopedics;  Laterality: Left;   TOTAL SHOULDER ARTHROPLASTY Right 05/25/2016   Procedure: TOTAL SHOULDER ARTHROPLASTY;  Surgeon: Osa Blase, MD;  Location: [facility];  Service: Orthopedics;  Laterality: Right;   Social History   Tobacco Use   Smoking status: Never   Smokeless tobacco: Never  Vaping Use   Vaping status: Never Used  Substance Use Topics   Alcohol use: Yes    Alcohol/week: 3.0 standard drinks of alcohol    Types: 1 Glasses of wine, 2 Cans of beer per week   Drug use: No   Social History   Socioeconomic History   Marital status: Married    Spouse name: Ashish Rossetti    Number of children: 3   Years of education: Not on file   Highest education level: Professional school degree (e.g., MD, DDS, DVM, JD)  Occupational History  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    Social Drivers of Corporate investment banker Strain: Low Risk  (03/04/2023)   Overall Financial Resource Strain (CARDIA)    Difficulty of Paying Living Expenses: Not hard at all  Food Insecurity: No Food Insecurity (03/04/2023)   Hunger Vital Sign    Worried About Running Out of Food in the Last Year: Never true    Ran Out of Food in the Last Year: Never true  Transportation Needs: No Transportation Needs (03/04/2023)   PRAPARE - Administrator, Civil Service (Medical): No    Lack of Transportation (Non-Medical): No  Physical Activity: Insufficiently Active (03/04/2023)   Exercise Vital Sign    Days of  Exercise per Week: 2 days    Minutes of Exercise per Session: 30 min  Stress: No Stress Concern Present (03/04/2023)   [name] of Occupational Health - Occupational Stress Questionnaire    Feeling of Stress : Not at all  Social Connections: Moderately Integrated (03/04/2023)   Social Connection and Isolation Panel [NHANES]    Frequency of Communication with Friends and Family: Once a week    Frequency of Social Gatherings with Friends and Family: Once a week    Attends Religious Services: More than 4 times per year    Active Member of [employer] or Organizations: Yes    Attends Engineer, structural: More than 4 times per year    Marital Status: Married  Catering manager Violence: Not At Risk (08/05/2021)   Humiliation, Afraid, Rape, and Kick questionnaire    Fear of Current or Ex-Partner: No    Emotionally Abused: No    Physically Abused: No    Sexually Abused: No   Family Status  Relation Name Status   Mother  Deceased   Father  Deceased at age 56       heart   Sister  Alive   Brother  Alive   PGF  (Not Specified)   Other  (Not Specified)   Neg Hx  (Not Specified)  No partnership data on file   Family History  Problem Relation Age of Onset   Arthritis Mother    Cancer Mother 2       breast   Hyperlipidemia Mother    Colon polyps Mother 36   Hypertension Mother    Heart disease Mother    Depression Mother    Anxiety disorder Mother    Obesity Mother    Breast cancer Mother    Heart disease Father        CAD--passed away 2011/09/12 age 83   Hyperlipidemia Father    Hypertension Father    Obesity Father    Diabetes Father    Depression Father    Sleep apnea Father    Cancer Sister 24       breast   Breast cancer Sister    Sleep apnea Sister    Cancer Brother 29  prostate   Prostate cancer Brother    Atrial fibrillation Brother    Tremor Paternal Grandfather    Breast cancer Other    Prostate cancer Other    Colon cancer Neg Hx    Esophageal  cancer Neg Hx    Rectal cancer Neg Hx    Stomach cancer Neg Hx    Allergies  Allergen Reactions   Lisinopril  Cough      Review of Systems  Constitutional:  Negative for fever and malaise/fatigue.  HENT:  Positive for congestion and sinus pain.   Eyes:  Negative for blurred vision.  Respiratory:  Positive for cough and sputum production. Negative for shortness of breath.   Cardiovascular:  Negative for chest pain, palpitations and leg swelling.  Gastrointestinal:  Negative for vomiting.  Musculoskeletal:  Negative for back pain.  Skin:  Negative for rash.  Neurological:  Negative for loss of consciousness and headaches.      Objective:     BP (!) 108/58 (BP Location: Left Arm, Patient Position: Sitting, Cuff Size: Normal)   Pulse 73   Temp 98.7 F (37.1 C) (Oral)   Resp 16   Ht 5\' 8"  (1.727 m)   Wt 216 lb (98 kg)   SpO2 98%   BMI 32.84 kg/m  BP Readings from Last 3 Encounters:  08/29/23 (!) 108/58  08/24/23 107/63  07/27/23 112/65   Wt Readings from Last 3 Encounters:  08/29/23 216 lb (98 kg)  08/24/23 207 lb (93.9 kg)  08/08/23 207 lb (93.9 kg)   SpO2 Readings from Last 3 Encounters:  08/29/23 98%  08/24/23 94%  07/27/23 98%      Physical Exam Vitals and nursing note reviewed.  Constitutional:      General: He is not in acute distress.    Appearance: Normal appearance. He is well-developed.  HENT:     Head: Normocephalic and atraumatic.     Nose: Congestion present.     Right Sinus: Maxillary sinus tenderness present.     Left Sinus: No maxillary sinus tenderness.  Eyes:     General: No scleral icterus.       Right eye: No discharge.        Left eye: No discharge.  Cardiovascular:     Rate and Rhythm: Normal rate and regular rhythm.     Heart sounds: No murmur heard. Pulmonary:     Effort: Pulmonary effort is normal. No respiratory distress.     Breath sounds: Normal breath sounds.  Musculoskeletal:        General: Normal range of motion.      Cervical back: Normal range of motion and neck supple.     Right lower leg: No edema.     Left lower leg: No edema.  Skin:    General: Skin is warm and dry.  Neurological:     Mental Status: He is alert and oriented to person, place, and time.  Psychiatric:        Mood and Affect: Mood normal.        Behavior: Behavior normal.        Thought Content: Thought content normal.        Judgment: Judgment normal.      No results found for any visits on 08/29/23.  eLast CBC Lab Results  Component Value Date   WBC 6.1 03/01/2023   HGB 15.1 03/01/2023   HCT 46.3 03/01/2023   MCV 95 03/01/2023   MCH 31.1 03/01/2023   RDW 13.2  03/01/2023   PLT 191 03/01/2023   Last metabolic panel Lab Results  Component Value Date   GLUCOSE 74 06/28/2023   NA 141 06/28/2023   K 4.8 06/28/2023   CL 103 06/28/2023   CO2 23 06/28/2023   BUN 16 06/28/2023   CREATININE 1.01 06/28/2023   EGFR 77 06/28/2023   CALCIUM  9.6 06/28/2023   PROT 6.4 06/28/2023   ALBUMIN 4.5 06/28/2023   LABGLOB 1.9 06/28/2023   AGRATIO 2.0 10/22/2019   BILITOT 0.7 06/28/2023   ALKPHOS 84 06/28/2023   AST 13 06/28/2023   ALT 10 06/28/2023   ANIONGAP 7 09/22/2022   Last lipids Lab Results  Component Value Date   CHOL 118 06/28/2023   HDL 45 06/28/2023   LDLCALC 58 06/28/2023   TRIG 70 06/28/2023   CHOLHDL 3 09/09/2022   Last hemoglobin A1c Lab Results  Component Value Date   HGBA1C 5.3 06/28/2023   Last thyroid  functions Lab Results  Component Value Date   TSH 0.62 09/09/2022   T3TOTAL 127 12/14/2017   T4TOTAL 9.2 03/05/2022   Last vitamin D  Lab Results  Component Value Date   VD25OH 85.3 06/28/2023   Last vitamin B12 and Folate Lab Results  Component Value Date   VITAMINB12 1,536 (H) 06/28/2023      The ASCVD Risk score (Arnett DK, et al., 2019) failed to calculate for the following reasons:   The valid total cholesterol range is 130 to 320 mg/dL    Assessment & Plan:   Problem List  Items Addressed This Visit       Unprioritized   Hyperlipidemia   Encourage heart healthy diet such as MIND or DASH diet, increase exercise, avoid trans fats, simple carbohydrates and processed foods, consider a krill or fish or flaxseed oil cap daily.        Essential hypertension   Well controlled, no changes to meds. Encouraged heart healthy diet such as the DASH diet and exercise as tolerated.        Diabetes mellitus (HCC)   Lab Results  Component Value Date   HGBA1C 5.3 06/28/2023   hgba1c acceptable, minimize simple carbs. Increase exercise as tolerated. Continue current meds       Other Visit Diagnoses       Chronic pansinusitis    -  Primary   Relevant Orders   CT Maxillofacial WO CM     Assessment and Plan Assessment & Plan Chronic Sinusitis   He has experienced nasal congestion and yellow nasal discharge for four months, mainly affecting the right nostril. Allergies include ragweed, goldenrod, and cats. He occasionally uses Afrin and has been on long-term Claritin , which may have lost its effectiveness. Symptoms persist despite using Flonase  and Astelin . Differential diagnosis includes nasal polyps or chronic sinusitis exacerbated by allergies. He also has chest congestion and cough, possibly due to a cold or post-nasal drip. A CT scan is preferred over a sinus x-ray for better evaluation. Singulair is considered as an alternative treatment for allergies. Plan: Order a CT scan of the sinuses. Prescribe Singulair. Advise continued use of Flonase  and Astelin . Recommend switching to a different antihistamine such as Zyrtec or Xyzal. Consider referral to an allergist or ENT if symptoms persist. Follow-up: Monitor symptoms and report on Singulair effectiveness. CT scan results may be delayed due to radiology backlog. Contact the office if no results are received within a week after the CT scan.    Return in about 6 months (around 02/28/2024), or if symptoms  worsen or fail to  improve, for annual exam, fasting.    Lennart Gladish R Lowne Chase, DO

## 2023-08-29 NOTE — Patient Instructions (Signed)

## 2023-09-05 ENCOUNTER — Encounter (INDEPENDENT_AMBULATORY_CARE_PROVIDER_SITE_OTHER): Payer: Self-pay | Admitting: Family Medicine

## 2023-09-05 ENCOUNTER — Ambulatory Visit (INDEPENDENT_AMBULATORY_CARE_PROVIDER_SITE_OTHER): Admitting: Family Medicine

## 2023-09-05 VITALS — BP 101/67 | HR 53 | Temp 98.1°F | Ht 68.0 in | Wt 207.0 lb

## 2023-09-05 DIAGNOSIS — E1169 Type 2 diabetes mellitus with other specified complication: Secondary | ICD-10-CM

## 2023-09-05 DIAGNOSIS — Z794 Long term (current) use of insulin: Secondary | ICD-10-CM

## 2023-09-05 DIAGNOSIS — Z7985 Long-term (current) use of injectable non-insulin antidiabetic drugs: Secondary | ICD-10-CM

## 2023-09-05 DIAGNOSIS — E119 Type 2 diabetes mellitus without complications: Secondary | ICD-10-CM

## 2023-09-05 DIAGNOSIS — Z6831 Body mass index (BMI) 31.0-31.9, adult: Secondary | ICD-10-CM | POA: Diagnosis not present

## 2023-09-05 DIAGNOSIS — J324 Chronic pansinusitis: Secondary | ICD-10-CM

## 2023-09-05 DIAGNOSIS — Z6841 Body Mass Index (BMI) 40.0 and over, adult: Secondary | ICD-10-CM

## 2023-09-05 DIAGNOSIS — M4800 Spinal stenosis, site unspecified: Secondary | ICD-10-CM

## 2023-09-05 DIAGNOSIS — E559 Vitamin D deficiency, unspecified: Secondary | ICD-10-CM

## 2023-09-05 DIAGNOSIS — E669 Obesity, unspecified: Secondary | ICD-10-CM | POA: Diagnosis not present

## 2023-09-05 DIAGNOSIS — Z7984 Long term (current) use of oral hypoglycemic drugs: Secondary | ICD-10-CM | POA: Diagnosis not present

## 2023-09-05 MED ORDER — MOUNJARO 7.5 MG/0.5ML ~~LOC~~ SOAJ: 7.5000 mg | SUBCUTANEOUS | 0 refills | Status: DC

## 2023-09-05 MED ORDER — METFORMIN HCL 500 MG PO TABS: 500.0000 mg | ORAL_TABLET | Freq: Every day | ORAL | 0 refills | Status: DC

## 2023-09-05 MED ORDER — VITAMIN D (ERGOCALCIFEROL) 1.25 MG (50000 UNIT) PO CAPS
ORAL_CAPSULE | ORAL | 0 refills | Status: DC
Start: 2023-09-05 — End: 2023-10-03

## 2023-09-05 NOTE — Progress Notes (Signed)
 Office: [phone]  /  Fax: [phone]  WEIGHT SUMMARY AND BIOMETRICS  Anthropometric Measurements Height: 5' 8 (1.727 m) Weight: 207 lb (93.9 kg) BMI (Calculated): 31.48 Weight at Last Visit: 202 lb Weight Lost Since Last Visit: 0 Weight Gained Since Last Visit: 1 lb Starting Weight: 288 lb Total Weight Loss (lbs): 81 lb (36.7 kg) Peak Weight: 288 lb   Body Composition  Body Fat %: 37.7 % Fat Mass (lbs): 78.4 lbs Muscle Mass (lbs): 123 lbs Total Body Water  (lbs): 96.4 lbs Visceral Fat Rating : 23   Other Clinical Data Fasting: yes Labs: no Today's Visit #: 72 Starting Date: 12/14/17    Chief Complaint: OBESITY    History of Present Illness Eric [name] is a 78 year old male with obesity and type 2 diabetes who presents for obesity treatment and progress assessment.  He has been prescribed a category three eating plan but adheres to it only about thirty percent of the time. Despite engaging in yard work two to three times a week for over an hour each session, he has gained one pound in the last month.  He has a persistent sinus problem with congestion lasting for four months, characterized by copious nasal discharge and an inability to breathe through the right side. Initially suspected to be a polyp, he has never had a sinus infection before. He experienced tooth pain, which resolved with a crown, but sinus symptoms persisted. Initial treatment with amoxicillin  provided some improvement, followed by Augmentin . He also uses Afrin, Flonase , and an antihistamine nasal spray. He reports a persistent odor and carries paper towels due to nasal discharge. A CT scan was performed, but results are pending.  He has a history of back pain due to stenosis, limiting his physical activity. Pain occurs after ten to fifteen minutes of standing, requiring him to sit on a bucket while working in the yard. He has undergone injections and was considered for nerve  ablation, but his pain level was deemed too low for the procedure.  He uses a CPAP machine for sleep apnea but has removed the humidifier due to cleanliness concerns. He uses tape to manage dry mouth, which is effective. He does not regularly monitor his blood sugar but reports no symptoms of hypoglycemia. He can go all day without eating when not hungry, attributing this to his medication.  His wife has experienced episodes of fainting, with symptoms of yawning and feeling hot preceding the episodes. She has had normal EKG and blood sugar readings during these episodes.      PHYSICAL EXAM:  Blood pressure 101/67, pulse (!) 53, temperature 98.1 F (36.7 C), height 5' 8 (1.727 m), weight 207 lb (93.9 kg), SpO2 98%. Body mass index is 31.47 kg/m.  DIAGNOSTIC DATA REVIEWED:  BMET    Component Value Date/Time   NA 141 06/28/2023 1042   K 4.8 06/28/2023 1042   CL 103 06/28/2023 1042   CO2 23 06/28/2023 1042   GLUCOSE 74 06/28/2023 1042   GLUCOSE 97 09/22/2022 0902   BUN 16 06/28/2023 1042   CREATININE 1.01 06/28/2023 1042   CREATININE 0.93 04/23/2016 1633   CALCIUM  9.6 06/28/2023 1042   GFRNONAA >60 09/22/2022 0902   GFRAA 74 10/22/2019 1243   Lab Results  Component Value Date   HGBA1C 5.3 06/28/2023   HGBA1C 5.5 03/05/2015   Lab Results  Component Value Date   INSULIN  3.6 06/28/2023   INSULIN  21.3 12/14/2017   Lab Results  Component Value  Date   TSH 0.62 09/09/2022   CBC    Component Value Date/Time   WBC 6.1 03/01/2023 1049   WBC 6.6 09/22/2022 0902   RBC 4.86 03/01/2023 1049   RBC 4.99 09/22/2022 0902   HGB 15.1 03/01/2023 1049   HCT 46.3 03/01/2023 1049   PLT 191 03/01/2023 1049   MCV 95 03/01/2023 1049   MCH 31.1 03/01/2023 1049   MCH 31.1 09/22/2022 0902   MCHC 32.6 03/01/2023 1049   MCHC 32.4 09/22/2022 0902   RDW 13.2 03/01/2023 1049   Iron Studies No results found for: IRON, TIBC, FERRITIN, IRONPCTSAT Lipid Panel     Component Value  Date/Time   CHOL 118 06/28/2023 1042   TRIG 70 06/28/2023 1042   HDL 45 06/28/2023 1042   CHOLHDL 3 09/09/2022 0927   VLDL 13.6 09/09/2022 0927   LDLCALC 58 06/28/2023 1042   Hepatic Function Panel     Component Value Date/Time   PROT 6.4 06/28/2023 1042   ALBUMIN 4.5 06/28/2023 1042   AST 13 06/28/2023 1042   ALT 10 06/28/2023 1042   ALKPHOS 84 06/28/2023 1042   BILITOT 0.7 06/28/2023 1042   BILIDIR 0.2 06/07/2014 0909      Component Value Date/Time   TSH 0.62 09/09/2022 0927   Nutritional Lab Results  Component Value Date   VD25OH 85.3 06/28/2023   VD25OH 67.8 03/01/2023   VD25OH 77.43 09/09/2022     Assessment and Plan Assessment & Plan Chronic Sinusitis Experiencing nasal congestion for four months, primarily on the right side, with copious nasal discharge and occasional upper teeth pain. Initially treated with amoxicillin , then Augmentin , both providing some relief. Awaiting CT scan results. Differential diagnosis includes chronic sinusitis with possible nasal polyps. Afrin provided temporary relief, suggesting inflammation rather than a polyp. - Await CT scan results - Continue using Flonase  and antihistamine nasal spray - Use saline nasal irrigation  Obesity Prescribed a category three eating plan but adheres to it about 30% of the time. Engages in physical activity by doing yard work two to three times a week for over an hour each time. Gained one pound in the last month. Reports loss of motivation, possibly influenced by sinus issues and back pain. - Continue category three eating plan - Engage in physical activity as tolerated - Follow up in four weeks  Spinal Stenosis Experiences back pain exacerbated by standing for 10-15 minutes, relieved by sitting. Evaluated by a neurosurgeon who diagnosed spinal stenosis and advised against surgery. Underwent injections and considered for nerve ablation, but pain level deemed too low for the procedure. Manages pain with  Tylenol . - Manage pain with Tylenol  as needed - Avoid prolonged standing, take breaks as needed - Consider further evaluation if symptoms worsen and pain level increases   Type 2 Diabetes Mellitus A1c remains well-controlled. Does not regularly monitor blood sugar but reports no hypoglycemia symptoms. Advised to maintain regular meals to prevent hypoglycemic episodes. - Continue Mounjaro  - Continue metformin  - Maintain regular meals to prevent hypoglycemia  Vitamin D  Deficiency On vitamin D  supplementation. - Continue vitamin D  supplementation    He was informed of the importance of frequent follow up visits to maximize his success with intensive lifestyle modifications for his multiple health conditions.    Jasmine Mesi, MD

## 2023-09-08 ENCOUNTER — Ambulatory Visit

## 2023-09-08 VITALS — Ht 68.0 in | Wt 207.0 lb

## 2023-09-08 DIAGNOSIS — Z0000 Encounter for general adult medical examination without abnormal findings: Secondary | ICD-10-CM | POA: Diagnosis not present

## 2023-09-08 NOTE — Patient Instructions (Addendum)
 Eric [name] , Thank you for taking time out of your busy schedule to complete your Annual Wellness Visit with me. I enjoyed our conversation and look forward to speaking with you again next year. I, as well as your care team,  appreciate your ongoing commitment to your health goals. Please review the following plan we discussed and let me know if I can assist you in the future. Your Game plan/ To Do List    Referrals: If you haven't heard from the office you've been referred to, please reach out to them at the phone provided.   Follow up Visits: Next Medicare AWV with our clinical staff: 09/13/24 @ 9:30a   Have you seen your provider in the last 6 months (3 months if uncontrolled diabetes)?  Next Office Visit with your provider: 02/28/24 @ 9a  Clinician Recommendations:  Aim for 30 minutes of exercise or brisk walking, 6-8 glasses of water , and 5 servings of fruits and vegetables each day.       This is a list of the screening recommended for you and due dates:  Health Maintenance  Topic Date Due   COVID-19 Vaccine (6 - 2024-25 season) 05/30/2023   Complete foot exam   09/09/2023   Eye exam for diabetics  09/27/2023   Flu Shot  10/21/2023   Hemoglobin A1C  12/28/2023   Yearly kidney health urinalysis for diabetes  03/10/2024   Yearly kidney function blood test for diabetes  06/27/2024   Medicare Annual Wellness Visit  09/07/2024   DTaP/Tdap/Td vaccine (3 - Td or Tdap) 12/31/2024   Pneumococcal Vaccine for age over 46  Completed   Hepatitis C Screening  Completed   Zoster (Shingles) Vaccine  Completed   HPV Vaccine  Aged Out   Meningitis B Vaccine  Aged Out   Colon Cancer Screening  Discontinued    Advanced directives: (Copy Requested) Please bring a copy of your health care power of attorney and living will to the office to be added to your chart at your convenience. You can mail to [facility] [address] [place], [place] [address] or email to  [email or web address] Advance Care Planning is important because it:  [x]  Makes sure you receive the medical care that is consistent with your values, goals, and preferences  [x]  It provides guidance to your family and loved ones and reduces their decisional burden about whether or not they are making the right decisions based on your wishes.  Follow the link provided in your after visit summary or read over the paperwork we have mailed to you to help you started getting your Advance Directives in place. If you need assistance in completing these, please reach out to us  so that we can help you!  See attachments for Preventive Care and Fall Prevention Tips.

## 2023-09-08 NOTE — Progress Notes (Signed)
 Subjective:   Eric [name] is a 78 y.o. who presents for a Medicare Wellness preventive visit.  As a reminder, Annual Wellness Visits don't include a physical exam, and some assessments may be limited, especially if this visit is performed virtually. We may recommend an in-person follow-up visit with your provider if needed.  Visit Complete: Virtual I connected with  Eric [name] on 09/08/23 by a audio enabled telemedicine application and verified that I am speaking with the correct person using two identifiers.  Patient Location: Home  Provider Location: Home Office  I discussed the limitations of evaluation and management by telemedicine. The patient expressed understanding and agreed to proceed.  Vital Signs: Because this visit was a virtual/telehealth visit, some criteria may be missing or patient reported. Any vitals not documented were not able to be obtained and vitals that have been documented are patient reported.    Persons Participating in Visit: Patient.  AWV Questionnaire: Yes: Patient Medicare AWV questionnaire was completed by the patient on 09/01/23; I have confirmed that all information answered by patient is correct and no changes since this date.  Cardiac Risk Factors include: advanced age (>10men, >68 women);male gender;hypertension     Objective:    Today's Vitals   09/08/23 0942  Weight: 207 lb (93.9 kg)  Height: 5' 8 (1.727 m)   Body mass index is 31.47 kg/m.     09/08/2023    9:49 AM 03/29/2023   10:19 AM 10/04/2022    5:35 AM 09/22/2022    8:41 AM 09/01/2022   10:21 AM 08/05/2021    2:24 PM 09/25/2018    8:12 AM  Advanced Directives  Does Patient Have a Medical Advance Directive? Yes No Yes Yes Yes Yes Yes  Type of Estate agent of [place];Living will  Healthcare Power of [employer] Power of [employer] Power of [place];Living will Healthcare Power of [place];Out of facility DNR (pink MOST or yellow  form);Living will Living will  Does patient want to make changes to medical advance directive?   No - Guardian declined No - Patient declined   No - Patient declined   Copy of Healthcare Power of Attorney in Chart? No - copy requested  No - copy requested  No - copy requested No - copy requested   Would patient like information on creating a medical advance directive?  No - Patient declined          Data saved with a previous flowsheet row definition    Current Medications (verified) Outpatient Encounter Medications as of 09/08/2023  Medication Sig   aspirin  EC 81 MG tablet Take 1 tablet (81 mg total) by mouth 2 (two) times daily. (Patient taking differently: Take 81 mg by mouth at bedtime.)   atorvastatin  (LIPITOR) 40 MG tablet Take 1 tablet (40 mg total) by mouth daily.   azelastine  (ASTELIN ) 0.1 % nasal spray Place 1 spray into both nostrils 2 (two) times daily. Use in each nostril as directed   calcium  carbonate (TUMS - DOSED IN MG ELEMENTAL CALCIUM ) 500 MG chewable tablet Chew 1,000 mg by mouth daily as needed for indigestion or heartburn.   clindamycin  (CLEOCIN -T) 1 % lotion Apply topically 2 (two) times daily. (Patient taking differently: Apply 1 Application topically 2 (two) times daily as needed (infected hair follicles).)   Coenzyme Q10 (COQ10) 200 MG CAPS Take 200 mg by mouth at bedtime.   Cyanocobalamin  (B-12) 1000 MCG CAPS Take 1 tablet by mouth daily.   famotidine (  PEPCID) 20 MG tablet Take 20 mg by mouth at bedtime.   fluocinonide  ointment (LIDEX ) 0.05 % Apply 1 application topically daily as needed (for bites).   fluticasone  (FLONASE ) 50 MCG/ACT nasal spray Place 2 sprays into both nostrils daily as needed for allergies.    Ketotifen Fumarate (ALLERGY EYE DROPS OP) Place 1 drop into both eyes daily as needed (allergies).   loratadine  (CLARITIN ) 10 MG tablet Take 10 mg by mouth daily.   Magnesium  250 MG TABS Take 250 mg by mouth daily.   meloxicam  (MOBIC ) 15 MG tablet Take 1  tablet (15 mg total) by mouth daily.   metFORMIN  (GLUCOPHAGE ) 500 MG tablet Take 1 tablet (500 mg total) by mouth daily with breakfast.   montelukast  (SINGULAIR ) 10 MG tablet Take 1 tablet (10 mg total) by mouth at bedtime.   pantoprazole  (PROTONIX ) 40 MG tablet Take 1 tablet (40 mg total) by mouth daily.   tamsulosin  (FLOMAX ) 0.4 MG CAPS capsule TAKE ONE CAPSULE BY MOUTH DAILY AFTER DINNER   tirzepatide  (MOUNJARO ) 7.5 MG/0.5ML Pen Inject 7.5 mg into the skin once a week.   valsartan  (DIOVAN ) 80 MG tablet Take 1 tablet (80 mg total) by mouth daily.   Vitamin D , Ergocalciferol , (DRISDOL ) 1.25 MG (50000 UNIT) CAPS capsule TAKE ONE CAPSULE BY MOUTH EVERY 7 DAYS   No facility-administered encounter medications on file as of 09/08/2023.    Allergies (verified) Lisinopril    History: Past Medical History:  Diagnosis Date   Alcohol abuse    Allergy    Back pain    Cancer (HCC)    Basal cell cancer  below left eye, had Mohs'   Cataract    forming   Colon polyps    Diverticulosis of colon    DJD (degenerative joint disease)    Dysrhythmia    Palpations from caffiene- reports he was having PACs due to slim fast wth caffeine - had full cardiac w/u and was told to cut back on caffeine;  now drink decaf bevrerages and no rpeort of recurrence    Enlarged prostate    GERD (gastroesophageal reflux disease)    Hearing loss    has HAs,   Hip osteoarthritis    Left   History of hay fever    Hx of cardiovascular stress test    Lexiscan  Myoview (04/2013):  No ischemia, EF 61%; low risk   Hyperlipemia    Hypertension    Insulin  resistance    om metfromin    Internal hemorrhoids without mention of complication    Leg edema    Neuroma of foot    left   Obesity    Osteoarthrosis, unspecified whether generalized or localized, unspecified site    Palpitations    Pneumonia    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   Pre-diabetes    Primary localized osteoarthrosis of right  shoulder 05/25/2016   Recent weight loss    75lbs   Seasonal allergies    Sleep apnea    CPAP   Thyroid  nodule    calcified thyroid  nodules- bx negative    Tremor of both hands    Past Surgical History:  Procedure Laterality Date   COLONOSCOPY     INGUINAL HERNIA REPAIR Left 03/29/2023   Procedure: OPEN LEFT INGUINAL HERNIA REPAIR WITH MESH;  Surgeon: Kinsinger, Alphonso Aschoff, MD;  Location: [facility];  Service: General;  Laterality: Left;   JOINT REPLACEMENT  2018   POLYPECTOMY     SHOULDER SURGERY  2009  right   TONSILLECTOMY     TOTAL HIP ARTHROPLASTY Left 03/28/2017   Procedure: TOTAL HIP ARTHROPLASTY ANTERIOR APPROACH;  Surgeon: Wendolyn Hamburger, MD;  Location: [facility];  Service: Orthopedics;  Laterality: Left;   TOTAL HIP ARTHROPLASTY Right 09/25/2018   Procedure: Right Anterior Hip Arthroplasty;  Surgeon: Wendolyn Hamburger, MD;  Location: [facility];  Service: Orthopedics;  Laterality: Right;   TOTAL KNEE ARTHROPLASTY Right 10/04/2022   Procedure: RIGHT TOTAL KNEE ARTHROPLASTY;  Surgeon: Wendolyn Hamburger, MD;  Location: [facility];  Service: Orthopedics;  Laterality: Right;   TOTAL SHOULDER ARTHROPLASTY Left 10/14/2015   Procedure: LEFT TOTAL SHOULDER ARTHROPLASTY;  Surgeon: Osa Blase, MD;  Location: [facility];  Service: Orthopedics;  Laterality: Left;   TOTAL SHOULDER ARTHROPLASTY Right 05/25/2016   Procedure: TOTAL SHOULDER ARTHROPLASTY;  Surgeon: Osa Blase, MD;  Location: [facility];  Service: Orthopedics;  Laterality: Right;   Family History  Problem Relation Age of Onset   Arthritis Mother    Cancer Mother 75       breast   Hyperlipidemia Mother    Colon polyps Mother 21   Hypertension Mother    Heart disease Mother    Depression Mother    Anxiety disorder Mother    Obesity Mother    Breast cancer Mother    Heart disease Father        CAD--passed away 09-24-2011 age 29   Hyperlipidemia Father    Hypertension Father    Obesity Father    Diabetes Father    Depression Father    Sleep apnea  Father    Cancer Sister 78       breast   Breast cancer Sister    Sleep apnea Sister    Cancer Brother 55       prostate   Prostate cancer Brother    Atrial fibrillation Brother    Tremor Paternal Grandfather    Breast cancer Other    Prostate cancer Other    Colon cancer Neg Hx    Esophageal cancer Neg Hx    Rectal cancer Neg Hx    Stomach cancer Neg Hx    Social History   Socioeconomic History   Marital status: Married    Spouse name: Tymothy Cass    Number of children: 3   Years of education: Not on file   Highest education level: Professional school degree (e.g., MD, DDS, DVM, JD)  Occupational History   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    Social Drivers of Corporate investment banker Strain: Low Risk  (09/08/2023)   Overall Financial Resource Strain (CARDIA)    Difficulty of Paying Living Expenses: Not hard at all  Food Insecurity: No Food Insecurity (09/08/2023)   Hunger Vital Sign    Worried About Running Out of Food in the Last Year: Never true    Ran Out of Food in the Last Year: Never true  Transportation Needs: No Transportation Needs (09/08/2023)   PRAPARE -  Administrator, Civil Service (Medical): No    Lack of Transportation (Non-Medical): No  Physical Activity: Insufficiently Active (09/08/2023)   Exercise Vital Sign    Days of Exercise per Week: 2 days    Minutes of Exercise per Session: 20 min  Stress: No Stress Concern Present (09/08/2023)   [name] of Occupational Health - Occupational  Stress Questionnaire    Feeling of Stress: Not at all  Social Connections: Socially Integrated (09/08/2023)   Social Connection and Isolation Panel    Frequency of Communication with Friends and Family: More than three times a week    Frequency of Social Gatherings with Friends and Family: Twice a week    Attends Religious Services: More than 4 times per year    Active Member of [employer] or Organizations: Yes    Attends Engineer, structural: More than 4 times per year    Marital Status: Married    Tobacco Counseling Counseling given: Not Answered    Clinical Intake:  Pre-visit preparation completed: Yes  Pain : No/denies pain     BMI - recorded: 31.37 Nutritional Status: BMI > 30  Obese Nutritional Risks: None Diabetes: Yes CBG done?: No Did pt. bring in CBG monitor from home?: No  Lab Results  Component Value Date   HGBA1C 5.3 06/28/2023   HGBA1C 5.3 03/01/2023   HGBA1C 5.4 09/09/2022     How often do you need to have someone help you when you read instructions, pamphlets, or other written materials from your doctor or pharmacy?: 1 - Never  Interpreter Needed?: No  Information entered by :: Lucile Sa LPN   Activities of Daily Living     09/08/2023    9:47 AM 09/01/2023    8:11 PM  In your present state of health, do you have any difficulty performing the following activities:  Hearing? 1 1  Vision? 0 0  Difficulty concentrating or making decisions? 0 0  Walking or climbing stairs? 1 1  Comment Uses a Cane   Dressing or bathing? 0 0  Doing errands, shopping? 0 0  Preparing Food and eating ? N N  Using the Toilet? N N  In the past six months, have you accidently leaked urine? Y Y  Comment Followed by medical attention   Do you have problems with loss of bowel control? N N  Managing your Medications? N N  Managing your Finances? N N  Housekeeping or managing your Housekeeping? N N    Patient Care Team: Crecencio Dodge, Candida Chalk, DO as PCP -  General Peggy Bowens Fabienne Holter, MD as Consulting Physician (Sports Medicine) Osa Blase, MD as Consulting Physician (Orthopedic Surgery) Dermatology, [facility], MD as Attending Physician (Neurology) Samson Croak, MD as Consulting Physician (Urology) Gearl Keens, MD as Consulting Physician (Neurosurgery) Begovich, Celestia Colander, DO (Sports Medicine) Arrie Bienenstock, MD as Referring Physician (Physical Medicine and Rehabilitation) Tepedino, Presley E, MD as Referring Physician (Ophthalmology)  I have updated your Care Teams any recent Medical Services you may have received from other providers in the past year.     Assessment:   This is a routine wellness examination for Eric [name].  Hearing/Vision screen Hearing Screening - Comments:: Wears Hearing Aids Vision Screening - Comments:: Wears rx glasses - up to date with routine eye exams with  Dr Algis Anton   Goals Addressed               This Visit's Progress  Increase physical activity (pt-stated)        Lose weight.       Depression Screen     09/08/2023    9:50 AM 09/09/2022    8:49 AM 09/01/2022   10:23 AM 03/05/2022    8:42 AM 08/05/2021    2:25 PM 09/01/2020    9:29 AM 03/03/2020    8:31 AM  PHQ 2/9 Scores  PHQ - 2 Score 0 0 0 0 0 0 0  PHQ- 9 Score  0  0       Fall Risk     09/08/2023    9:48 AM 09/01/2023    8:11 PM 08/25/2022   10:02 AM 03/05/2022    8:42 AM 08/05/2021    2:25 PM  Fall Risk   Falls in the past year? 0 0 0 0 1  Number falls in past yr: 0 0 0 0 0  Injury with Fall? 0 0 0 0 0  Risk for fall due to : No Fall Risks  No Fall Risks  History of fall(s)  Follow up Falls evaluation completed  Falls evaluation completed  Falls evaluation completed      Data saved with a previous flowsheet row definition    MEDICARE RISK AT HOME:  Medicare Risk at Home Any stairs in or around the home?: Yes If so, are there any without handrails?: No Home free of loose throw rugs in walkways,  pet beds, electrical cords, etc?: Yes Adequate lighting in your home to reduce risk of falls?: Yes Life alert?: No Use of a cane, walker or w/c?: Yes Grab bars in the bathroom?: Yes Shower chair or bench in shower?: Yes Elevated toilet seat or a handicapped toilet?: Yes  TIMED UP AND GO:  Was the test performed?  No  Cognitive Function: 6CIT completed    04/23/2016    4:00 PM  MMSE - Mini Mental State Exam  Orientation to time 5   Orientation to Place 5   Registration 3   Attention/ Calculation 5   Recall 3   Language- name 2 objects 2   Language- repeat 1  Language- follow 3 step command 3   Language- read & follow direction 1   Write a sentence 1   Copy design 1   Total score 30      Data saved with a previous flowsheet row definition        09/08/2023    9:49 AM 09/01/2022   10:26 AM 08/05/2021    2:30 PM  6CIT Screen  What Year? 0 points 0 points 0 points  What month? 0 points 0 points 0 points  What time? 0 points 0 points 0 points  Count back from 20 0 points 0 points 0 points  Months in reverse 0 points 0 points 0 points  Repeat phrase 0 points 4 points 2 points  Total Score 0 points 4 points 2 points    Immunizations Immunization History  Administered Date(s) Administered   Influenza Split 12/24/2011, 12/11/2019   Influenza Whole 01/01/2009   Influenza, High Dose Seasonal PF 11/26/2013, 01/01/2015, 11/18/2016, 11/28/2018, 12/09/2021   Influenza,inj,Quad PF,6+ Mos 01/15/2013   Influenza-Unspecified 12/15/2015, 12/16/2017, 11/28/2018, 12/18/2020, 11/30/2022   PFIZER(Purple Top)SARS-COV-2 Vaccination 04/12/2019, 05/03/2019, 12/21/2019   Pfizer Covid-19 Vaccine Bivalent Booster 39yrs & up 03/05/2021   Pneumococcal Conjugate-13 02/11/2014   Pneumococcal Polysaccharide-23 12/24/2011   Td 05/01/2008   Tdap 01/01/2015   Unspecified SARS-COV-2 Vaccination 11/30/2022   Zoster Recombinant(Shingrix) 11/24/2016, 03/10/2017  Zoster, Live 10/08/2010     Screening Tests Health Maintenance  Topic Date Due   COVID-19 Vaccine (6 - 2024-25 season) 05/30/2023   FOOT EXAM  09/09/2023   OPHTHALMOLOGY EXAM  09/27/2023   INFLUENZA VACCINE  10/21/2023   HEMOGLOBIN A1C  12/28/2023   Diabetic kidney evaluation - Urine ACR  03/10/2024   Diabetic kidney evaluation - eGFR measurement  06/27/2024   Medicare Annual Wellness (AWV)  09/07/2024   DTaP/Tdap/Td (3 - Td or Tdap) 12/31/2024   Pneumococcal Vaccine: 50+ Years  Completed   Hepatitis C Screening  Completed   Zoster Vaccines- Shingrix  Completed   HPV VACCINES  Aged Out   Meningococcal B Vaccine  Aged Out   Colonoscopy  Discontinued    Health Maintenance  Health Maintenance Due  Topic Date Due   COVID-19 Vaccine (6 - 2024-25 season) 05/30/2023   Health Maintenance Items Addressed:   Additional Screening:  Vision Screening: Recommended annual ophthalmology exams for early detection of glaucoma and other disorders of the eye. Would you like a referral to an eye doctor? No    Dental Screening: Recommended annual dental exams for proper oral hygiene  Community Resource Referral / Chronic Care Management: CRR required this visit?  No   CCM required this visit?  No   Plan:    I have personally reviewed and noted the following in the patient's chart:   Medical and social history Use of alcohol, tobacco or illicit drugs  Current medications and supplements including opioid prescriptions. Patient is not currently taking opioid prescriptions. Functional ability and status Nutritional status Physical activity Advanced directives List of other physicians Hospitalizations, surgeries, and ER visits in previous 12 months Vitals Screenings to include cognitive, depression, and falls Referrals and appointments  In addition, I have reviewed and discussed with patient certain preventive protocols, quality metrics, and best practice recommendations. A written personalized care plan  for preventive services as well as general preventive health recommendations were provided to patient.   Dewayne Ford, LPN   06/28/8117   After Visit Summary: (MyChart) Due to this being a telephonic visit, the after visit summary with patients personalized plan was offered to patient via MyChart   Notes: Nothing significant to report at this time.

## 2023-09-09 ENCOUNTER — Ambulatory Visit: Payer: TRICARE For Life (TFL) | Admitting: Family Medicine

## 2023-09-13 ENCOUNTER — Ambulatory Visit

## 2023-09-20 ENCOUNTER — Ambulatory Visit

## 2023-09-20 ENCOUNTER — Ambulatory Visit: Payer: Self-pay | Admitting: Family Medicine

## 2023-09-21 ENCOUNTER — Encounter: Payer: Self-pay | Admitting: Family Medicine

## 2023-09-21 DIAGNOSIS — J324 Chronic pansinusitis: Secondary | ICD-10-CM

## 2023-09-28 DIAGNOSIS — H2513 Age-related nuclear cataract, bilateral: Secondary | ICD-10-CM | POA: Diagnosis not present

## 2023-09-28 DIAGNOSIS — Z794 Long term (current) use of insulin: Secondary | ICD-10-CM | POA: Diagnosis not present

## 2023-09-28 DIAGNOSIS — Z7984 Long term (current) use of oral hypoglycemic drugs: Secondary | ICD-10-CM | POA: Diagnosis not present

## 2023-09-28 DIAGNOSIS — H5203 Hypermetropia, bilateral: Secondary | ICD-10-CM | POA: Diagnosis not present

## 2023-09-28 DIAGNOSIS — H524 Presbyopia: Secondary | ICD-10-CM | POA: Diagnosis not present

## 2023-09-28 DIAGNOSIS — E119 Type 2 diabetes mellitus without complications: Secondary | ICD-10-CM | POA: Diagnosis not present

## 2023-09-28 DIAGNOSIS — H25013 Cortical age-related cataract, bilateral: Secondary | ICD-10-CM | POA: Diagnosis not present

## 2023-09-28 DIAGNOSIS — H25043 Posterior subcapsular polar age-related cataract, bilateral: Secondary | ICD-10-CM | POA: Diagnosis not present

## 2023-09-28 LAB — HM DIABETES EYE EXAM

## 2023-09-30 ENCOUNTER — Encounter (INDEPENDENT_AMBULATORY_CARE_PROVIDER_SITE_OTHER): Payer: Self-pay

## 2023-10-03 ENCOUNTER — Encounter (INDEPENDENT_AMBULATORY_CARE_PROVIDER_SITE_OTHER): Payer: Self-pay | Admitting: Family Medicine

## 2023-10-03 ENCOUNTER — Ambulatory Visit (INDEPENDENT_AMBULATORY_CARE_PROVIDER_SITE_OTHER): Admitting: Family Medicine

## 2023-10-03 VITALS — BP 100/66 | HR 48 | Temp 97.9°F | Ht 68.0 in | Wt 205.0 lb

## 2023-10-03 DIAGNOSIS — E559 Vitamin D deficiency, unspecified: Secondary | ICD-10-CM

## 2023-10-03 DIAGNOSIS — Z7984 Long term (current) use of oral hypoglycemic drugs: Secondary | ICD-10-CM | POA: Diagnosis not present

## 2023-10-03 DIAGNOSIS — E1169 Type 2 diabetes mellitus with other specified complication: Secondary | ICD-10-CM | POA: Diagnosis not present

## 2023-10-03 DIAGNOSIS — Z794 Long term (current) use of insulin: Secondary | ICD-10-CM

## 2023-10-03 DIAGNOSIS — Z7985 Long-term (current) use of injectable non-insulin antidiabetic drugs: Secondary | ICD-10-CM

## 2023-10-03 DIAGNOSIS — Z6831 Body mass index (BMI) 31.0-31.9, adult: Secondary | ICD-10-CM

## 2023-10-03 DIAGNOSIS — E669 Obesity, unspecified: Secondary | ICD-10-CM | POA: Diagnosis not present

## 2023-10-03 MED ORDER — VITAMIN D (ERGOCALCIFEROL) 1.25 MG (50000 UNIT) PO CAPS: ORAL_CAPSULE | ORAL | 0 refills | Status: DC

## 2023-10-04 NOTE — Progress Notes (Signed)
 Office: [phone]  /  Fax: [phone]  WEIGHT SUMMARY AND BIOMETRICS  Anthropometric Measurements Height: 5' 8 (1.727 m) Weight: 205 lb (93 kg) BMI (Calculated): 31.18 Weight at Last Visit: 207 lb Weight Lost Since Last Visit: 2 lb Weight Gained Since Last Visit: 0 Starting Weight: 288 lb Total Weight Loss (lbs): 83 lb (37.6 kg) Peak Weight: 288 lb   Body Composition  Body Fat %: 37.2 % Fat Mass (lbs): 76.4 lbs Muscle Mass (lbs): 122.6 lbs Total Body Water  (lbs): 93.8 lbs Visceral Fat Rating : 22   Other Clinical Data Fasting: yes Today's Visit #: no Starting Date: 12/14/17    Chief Complaint: OBESITY    History of Present Illness Eric [name] is a 78 year old male who presents for a follow-up on his obesity treatment plan.  He has been adhering to the category three eating plan approximately sixty percent of the time and has increased his physical activity by engaging in yard work for about an hour, three times a week. Over the past month, he has lost two pounds. He is modifying his eating plan by increasing protein intake and reducing simple carbohydrates. He notes that his wife sometimes enables him, which poses a challenge to his weight loss efforts.  He has a history of vitamin D  deficiency and is stable on prescription vitamin D , for which he requests a refill.  He has type two diabetes and is currently on metformin  and Mounjaro . He is managing his condition by reducing simple carbohydrates, increasing protein intake, and enhancing his exercise regimen.      PHYSICAL EXAM:  Blood pressure 100/66, pulse (!) 48, temperature 97.9 F (36.6 C), height 5' 8 (1.727 m), weight 205 lb (93 kg), SpO2 98%. Body mass index is 31.17 kg/m.  DIAGNOSTIC DATA REVIEWED:  BMET    Component Value Date/Time   NA 141 06/28/2023 1042   K 4.8 06/28/2023 1042   CL 103 06/28/2023 1042   CO2 23 06/28/2023 1042   GLUCOSE 74 06/28/2023 1042   GLUCOSE 97  09/22/2022 0902   BUN 16 06/28/2023 1042   CREATININE 1.01 06/28/2023 1042   CREATININE 0.93 04/23/2016 1633   CALCIUM  9.6 06/28/2023 1042   GFRNONAA >60 09/22/2022 0902   GFRAA 74 10/22/2019 1243   Lab Results  Component Value Date   HGBA1C 5.3 06/28/2023   HGBA1C 5.5 03/05/2015   Lab Results  Component Value Date   INSULIN  3.6 06/28/2023   INSULIN  21.3 12/14/2017   Lab Results  Component Value Date   TSH 0.62 09/09/2022   CBC    Component Value Date/Time   WBC 6.1 03/01/2023 1049   WBC 6.6 09/22/2022 0902   RBC 4.86 03/01/2023 1049   RBC 4.99 09/22/2022 0902   HGB 15.1 03/01/2023 1049   HCT 46.3 03/01/2023 1049   PLT 191 03/01/2023 1049   MCV 95 03/01/2023 1049   MCH 31.1 03/01/2023 1049   MCH 31.1 09/22/2022 0902   MCHC 32.6 03/01/2023 1049   MCHC 32.4 09/22/2022 0902   RDW 13.2 03/01/2023 1049   Iron Studies No results found for: IRON, TIBC, FERRITIN, IRONPCTSAT Lipid Panel     Component Value Date/Time   CHOL 118 06/28/2023 1042   TRIG 70 06/28/2023 1042   HDL 45 06/28/2023 1042   CHOLHDL 3 09/09/2022 0927   VLDL 13.6 09/09/2022 0927   LDLCALC 58 06/28/2023 1042   Hepatic Function Panel     Component Value Date/Time   PROT  6.4 06/28/2023 1042   ALBUMIN 4.5 06/28/2023 1042   AST 13 06/28/2023 1042   ALT 10 06/28/2023 1042   ALKPHOS 84 06/28/2023 1042   BILITOT 0.7 06/28/2023 1042   BILIDIR 0.2 06/07/2014 0909      Component Value Date/Time   TSH 0.62 09/09/2022 0927   Nutritional Lab Results  Component Value Date   VD25OH 85.3 06/28/2023   VD25OH 67.8 03/01/2023   VD25OH 77.43 09/09/2022     Assessment and Plan Assessment & Plan Type 2 Diabetes Mellitus Managed on metformin  and Mounjaro  with adherence to dietary modifications and exercise, resulting in good control. - Continue metformin  and Mounjaro  - Continue dietary modifications and exercise regimen  Obesity Following category three eating plan 60% of the time,  resulting in a two-pound weight loss over the last month. Engages in physical activity, including yard work for an hour three times a week. Efforts to increase protein intake and decrease simple carbohydrates are ongoing, though his wife sometimes enables him, complicating weight loss efforts. - Continue category three eating plan - Encourage increasing protein intake and decreasing simple carbohydrates - Encourage increasing physical activity, including strengthening exercises  Vitamin D  Deficiency Well-managed on prescription vitamin D , requesting a refill. - Refill prescription vitamin D  for 90 days      He was informed of the importance of frequent follow up visits to maximize his success with intensive lifestyle modifications for his multiple health conditions.    Louann Penton, MD

## 2023-10-13 DIAGNOSIS — M25561 Pain in right knee: Secondary | ICD-10-CM | POA: Diagnosis not present

## 2023-10-13 DIAGNOSIS — M160 Bilateral primary osteoarthritis of hip: Secondary | ICD-10-CM | POA: Diagnosis not present

## 2023-10-31 ENCOUNTER — Encounter (INDEPENDENT_AMBULATORY_CARE_PROVIDER_SITE_OTHER): Payer: Self-pay | Admitting: Family Medicine

## 2023-10-31 ENCOUNTER — Ambulatory Visit (INDEPENDENT_AMBULATORY_CARE_PROVIDER_SITE_OTHER): Admitting: Family Medicine

## 2023-10-31 VITALS — BP 97/64 | HR 53 | Temp 97.6°F | Ht 68.0 in | Wt 205.0 lb

## 2023-10-31 DIAGNOSIS — M6281 Muscle weakness (generalized): Secondary | ICD-10-CM | POA: Diagnosis not present

## 2023-10-31 DIAGNOSIS — T383X5A Adverse effect of insulin and oral hypoglycemic [antidiabetic] drugs, initial encounter: Secondary | ICD-10-CM | POA: Diagnosis not present

## 2023-10-31 DIAGNOSIS — K5903 Drug induced constipation: Secondary | ICD-10-CM

## 2023-10-31 DIAGNOSIS — E119 Type 2 diabetes mellitus without complications: Secondary | ICD-10-CM | POA: Diagnosis not present

## 2023-10-31 DIAGNOSIS — E669 Obesity, unspecified: Secondary | ICD-10-CM

## 2023-10-31 DIAGNOSIS — Z6831 Body mass index (BMI) 31.0-31.9, adult: Secondary | ICD-10-CM | POA: Diagnosis not present

## 2023-10-31 DIAGNOSIS — K5901 Slow transit constipation: Secondary | ICD-10-CM

## 2023-10-31 DIAGNOSIS — Z7984 Long term (current) use of oral hypoglycemic drugs: Secondary | ICD-10-CM

## 2023-10-31 DIAGNOSIS — Z7985 Long-term (current) use of injectable non-insulin antidiabetic drugs: Secondary | ICD-10-CM

## 2023-10-31 DIAGNOSIS — E1169 Type 2 diabetes mellitus with other specified complication: Secondary | ICD-10-CM

## 2023-10-31 MED ORDER — METFORMIN HCL 500 MG PO TABS: 500.0000 mg | ORAL_TABLET | Freq: Every day | ORAL | 0 refills | Status: DC

## 2023-10-31 NOTE — Progress Notes (Signed)
 Office: [phone]  /  Fax: [phone]  WEIGHT SUMMARY AND BIOMETRICS  Anthropometric Measurements Height: 5' 8 (1.727 m) Weight: 205 lb (93 kg) BMI (Calculated): 31.18 Weight at Last Visit: 205 lb Weight Lost Since Last Visit: 0 Weight Gained Since Last Visit: 0 Starting Weight: 288 lb Total Weight Loss (lbs): 83 lb (37.6 kg) Peak Weight: 288 lb   Body Composition  Body Fat %: 37.2 % Fat Mass (lbs): 76.4 lbs Muscle Mass (lbs): 122.4 lbs Total Body Water  (lbs): 95.2 lbs Visceral Fat Rating : 22   Other Clinical Data Fasting: yes Labs: no Today's Visit #: no Starting Date: 12/14/17    Chief Complaint: OBESITY    History of Present Illness Eric [name] is a 78 year old male with obesity and type 2 diabetes who presents for obesity treatment and diabetes management.  He is focused on managing his obesity and has been following a category three eating plan about fifty percent of the time. For exercise, he engages in yard work for sixty to ninety minutes three days a week. Despite these efforts, he has maintained his weight over the last month since his last visit.  In terms of his type 2 diabetes management, he is currently on metformin  and Mounjaro  at 7.5 mg. He requests a refill for metformin . He generally does not feel hungry, which he attributes to Mounjaro , but experiences constipation as a side effect. He attempts to manage this with Miralax  but is unsure about the correct dosage and frequency, sometimes forgetting to take it. His bowel movements are described as hard to pass and 'little bitty round balls like a moose.'  He notes that his legs feel weaker, which he attributes to muscle mass loss potentially due to Mounjaro . He has a history of artificial hips and knee surgery. He admits to a lack of motivation to exercise, although he has considered increasing his physical activity, such as walking stairs and using dumbbells, to prepare for deer  season.  He mentions a slight drop in blood pressure and acknowledges not hydrating well on the day of the visit. He expressed concern about his sons using creatine supplementation and its potential effects on kidney function, but he is not planning to take it himself.      PHYSICAL EXAM:  Blood pressure 97/64, pulse (!) 53, temperature 97.6 F (36.4 C), height 5' 8 (1.727 m), weight 205 lb (93 kg), SpO2 98%. Body mass index is 31.17 kg/m.  DIAGNOSTIC DATA REVIEWED:  BMET    Component Value Date/Time   NA 141 06/28/2023 1042   K 4.8 06/28/2023 1042   CL 103 06/28/2023 1042   CO2 23 06/28/2023 1042   GLUCOSE 74 06/28/2023 1042   GLUCOSE 97 09/22/2022 0902   BUN 16 06/28/2023 1042   CREATININE 1.01 06/28/2023 1042   CREATININE 0.93 04/23/2016 1633   CALCIUM  9.6 06/28/2023 1042   GFRNONAA >60 09/22/2022 0902   GFRAA 74 10/22/2019 1243   Lab Results  Component Value Date   HGBA1C 5.3 06/28/2023   HGBA1C 5.5 03/05/2015   Lab Results  Component Value Date   INSULIN  3.6 06/28/2023   INSULIN  21.3 12/14/2017   Lab Results  Component Value Date   TSH 0.62 09/09/2022   CBC    Component Value Date/Time   WBC 6.1 03/01/2023 1049   WBC 6.6 09/22/2022 0902   RBC 4.86 03/01/2023 1049   RBC 4.99 09/22/2022 0902   HGB 15.1 03/01/2023 1049   HCT 46.3  03/01/2023 1049   PLT 191 03/01/2023 1049   MCV 95 03/01/2023 1049   MCH 31.1 03/01/2023 1049   MCH 31.1 09/22/2022 0902   MCHC 32.6 03/01/2023 1049   MCHC 32.4 09/22/2022 0902   RDW 13.2 03/01/2023 1049   Iron Studies No results found for: IRON, TIBC, FERRITIN, IRONPCTSAT Lipid Panel     Component Value Date/Time   CHOL 118 06/28/2023 1042   TRIG 70 06/28/2023 1042   HDL 45 06/28/2023 1042   CHOLHDL 3 09/09/2022 0927   VLDL 13.6 09/09/2022 0927   LDLCALC 58 06/28/2023 1042   Hepatic Function Panel     Component Value Date/Time   PROT 6.4 06/28/2023 1042   ALBUMIN 4.5 06/28/2023 1042   AST 13  06/28/2023 1042   ALT 10 06/28/2023 1042   ALKPHOS 84 06/28/2023 1042   BILITOT 0.7 06/28/2023 1042   BILIDIR 0.2 06/07/2014 0909      Component Value Date/Time   TSH 0.62 09/09/2022 0927   Nutritional Lab Results  Component Value Date   VD25OH 85.3 06/28/2023   VD25OH 67.8 03/01/2023   VD25OH 77.43 09/09/2022     Assessment and Plan Assessment & Plan Obesity Following the category three eating plan about 50% of the time and engaging in yard work for 60-90 minutes three days a week. Weight has been maintained over the last month. On Mounjaro  7.5 mg, which may have contributed to muscle mass loss. Reports decreased hunger and is working on diet, exercise, and weight loss. - Continue category three eating plan - Continue yard work for exercise - Continue Mounjaro  7.5 mg - Encourage increased adherence to the eating plan  Type 2 diabetes mellitus On metformin  and requests a refill. Working on diet, exercise, and weight loss to manage diabetes. - Refill metformin  for 90 days - Continue Mounjaro  - Continue diet, exercise and weight loss as discussed today as an important part of the treatment plan   Constipation Reports constipation associated with Mounjaro  use, characterized by hard, painful bowel movements. Using Miralax  but unsure of the dosage and frequency. Not achieving desired results and sometimes forgets to take it. Discussed that Miralax  stays in the GI tract and does not absorb into the bloodstream, allowing flexibility in timing of doses. - Encourage taking a full dose of Miralax  at least 6 out of 7 days a week - Advise to take Miralax  at night if needed - Ensure adequate hydration  Muscle weakness Reports weaker legs, possibly due to Mounjaro  and weight loss. Has artificial hips and knee surgery. Concerned about muscle mass loss and preparing for deer season, which involves climbing deer stands. Advised to increase protein intake and challenge muscles more. Not  motivated to exercise regularly but plans to start walking and using stairs to prepare for deer season. Discussed that maintaining muscle mass requires challenging muscles and adequate protein intake. - Encourage walking and using stairs to prepare for deer season - Advise starting with up and down stairs twice a day - Ensure holding onto the rail while using stairs - Encourage increased protein intake     I have personally spent 30 minutes total time today in preparation, patient care, and documentation for this visit, including the following: review of clinical lab tests; review of medical history, and nutritional counseling   He was informed of the importance of frequent follow up visits to maximize his success with intensive lifestyle modifications for his multiple health conditions.    Louann Penton, MD

## 2023-12-02 ENCOUNTER — Encounter: Payer: Self-pay | Admitting: Family Medicine

## 2023-12-06 ENCOUNTER — Encounter: Payer: Self-pay | Admitting: Family Medicine

## 2023-12-06 ENCOUNTER — Telehealth (INDEPENDENT_AMBULATORY_CARE_PROVIDER_SITE_OTHER): Payer: Self-pay | Admitting: Otolaryngology

## 2023-12-06 ENCOUNTER — Ambulatory Visit (INDEPENDENT_AMBULATORY_CARE_PROVIDER_SITE_OTHER): Admitting: Otolaryngology

## 2023-12-06 ENCOUNTER — Encounter (INDEPENDENT_AMBULATORY_CARE_PROVIDER_SITE_OTHER): Payer: Self-pay | Admitting: Otolaryngology

## 2023-12-06 VITALS — BP 106/69 | HR 65 | Ht 68.0 in | Wt 204.0 lb

## 2023-12-06 DIAGNOSIS — J328 Other chronic sinusitis: Secondary | ICD-10-CM

## 2023-12-06 DIAGNOSIS — R0981 Nasal congestion: Secondary | ICD-10-CM | POA: Diagnosis not present

## 2023-12-06 DIAGNOSIS — J342 Deviated nasal septum: Secondary | ICD-10-CM | POA: Diagnosis not present

## 2023-12-06 DIAGNOSIS — J343 Hypertrophy of nasal turbinates: Secondary | ICD-10-CM | POA: Diagnosis not present

## 2023-12-06 MED ORDER — AMOXICILLIN-POT CLAVULANATE 875-125 MG PO TABS
1.0000 | ORAL_TABLET | Freq: Two times a day (BID) | ORAL | 0 refills | Status: AC
Start: 2023-12-06 — End: 2023-12-16

## 2023-12-06 NOTE — Progress Notes (Signed)
 Dear Dr. Antonio Meth, Here is my assessment for our mutual patient, Eric [name]. Thank you for allowing me the opportunity to care for your patient. Please do not hesitate to contact me should you have any other questions. Sincerely, Dr. Eldora Blanch  Otolaryngology Clinic Note  HISTORY: Eric [name] is a 78 y.o. male kindly referred by Dr. Antonio Meth for evaluation of chronic sinusitis  Initial visit (11/2023):  He reports that he had a cold in January 2025, and he recovered from this but then had persistent bilateral discolored drainage and facial pressure bilaterally with foul smelling drainage. He reports that he did have dental pain on the right maxillary tooth for which he had dental procedure but did not resolve his symptoms. He has been on antibiotics (multiple rounds) with Dr. Antonio Meth prescribing him augmentin  and prednisone  as well as claritin , flonase , and astelin . He got some better but his symptoms continue to persist and continues to have foul smelling drainage, nasal congestion and bilateral facial pressure.  He also had a CT done and was referred here. Prior to this, he denies history of frequent sinus infections.   Improvement occurred with antibiotics, and nasal sprays.Allergy testing has not been done.  Denies typical AR symptoms. No previous sinonasal surgery.  Prior has had RAST which showed ragweed, goldenrod, BAHA grass, cats.  He is currently using flonase , astelin , and PO anthistamine and saline spray. Was using rinses prior.  He is a retired PA  GLP-1: Mounjaro  AP/AC: ASA 81  Tobacco: no; EtOH: prior  PMHx: HLD, HTN, DM, Multiple Joint Replacement, OSA on CPAP, Palpitations  RADIOGRAPHIC EVALUATION AND INDEPENDENT REVIEW OF OTHER RECORDS: Dr. Antonio Meth (08/29/2023): nasal congestion and yellow nasal drainage for 4 months primarily on right. Allergy testing done - ragweed, goldenrod, cats; occassional afrin, on claritin , flonase , astelin . Dx:  Sinusitis;Rx: CT and ref to ENT CT Face 08/29/2023 independently interpreted: bilateral [facility] pattern sinusitis with septum deviates right and bilateral inferior turbinate hypertrophy; noted maxillary molars partially into maxillary sinus b/l CBC w/diff: WBC 6.1, Eos 0  Past Medical History:  Diagnosis Date   Alcohol abuse    Allergy    Back pain    Cancer (HCC)    Basal cell cancer  below left eye, had Mohs'   Cataract    forming   Colon polyps    Diverticulosis of colon    DJD (degenerative joint disease)    Dysrhythmia    Palpations from caffiene- reports he was having PACs due to slim fast wth caffeine - had full cardiac w/u and was told to cut back on caffeine;  now drink decaf bevrerages and no rpeort of recurrence    Enlarged prostate    GERD (gastroesophageal reflux disease)    Hearing loss    has HAs,   Hip osteoarthritis    Left   History of hay fever    Hx of cardiovascular stress test    Lexiscan  Myoview (04/2013):  No ischemia, EF 61%; low risk   Hyperlipemia    Hypertension    Insulin  resistance    om metfromin    Internal hemorrhoids without mention of complication    Leg edema    Neuroma of foot    left   Obesity    Osteoarthrosis, unspecified whether generalized or localized, unspecified site    Palpitations    Pneumonia    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   Pre-diabetes    Primary localized osteoarthrosis of right shoulder 05/25/2016  Recent weight loss    75lbs   Seasonal allergies    Sleep apnea    CPAP   Thyroid  nodule    calcified thyroid  nodules- bx negative    Tremor of both hands    Past Surgical History:  Procedure Laterality Date   COLONOSCOPY     INGUINAL HERNIA REPAIR Left 03/29/2023   Procedure: OPEN LEFT INGUINAL HERNIA REPAIR WITH MESH;  Surgeon: Kinsinger, Herlene Righter, MD;  Location: [facility];  Service: General;  Laterality: Left;   JOINT REPLACEMENT  01-10-17   POLYPECTOMY     SHOULDER SURGERY  2008-01-11   right    TONSILLECTOMY     TOTAL HIP ARTHROPLASTY Left 03/28/2017   Procedure: TOTAL HIP ARTHROPLASTY ANTERIOR APPROACH;  Surgeon: Liam Lerner, MD;  Location: [facility];  Service: Orthopedics;  Laterality: Left;   TOTAL HIP ARTHROPLASTY Right 09/25/2018   Procedure: Right Anterior Hip Arthroplasty;  Surgeon: Liam Lerner, MD;  Location: [facility];  Service: Orthopedics;  Laterality: Right;   TOTAL KNEE ARTHROPLASTY Right 10/04/2022   Procedure: RIGHT TOTAL KNEE ARTHROPLASTY;  Surgeon: Liam Lerner, MD;  Location: [facility];  Service: Orthopedics;  Laterality: Right;   TOTAL SHOULDER ARTHROPLASTY Left 10/14/2015   Procedure: LEFT TOTAL SHOULDER ARTHROPLASTY;  Surgeon: Fonda Olmsted, MD;  Location: [facility];  Service: Orthopedics;  Laterality: Left;   TOTAL SHOULDER ARTHROPLASTY Right 05/25/2016   Procedure: TOTAL SHOULDER ARTHROPLASTY;  Surgeon: Fonda Olmsted, MD;  Location: [facility];  Service: Orthopedics;  Laterality: Right;   Family History  Problem Relation Age of Onset   Arthritis Mother    Cancer Mother 41       breast   Hyperlipidemia Mother    Colon polyps Mother 58   Hypertension Mother    Heart disease Mother    Depression Mother    Anxiety disorder Mother    Obesity Mother    Breast cancer Mother    Heart disease Father        CAD--passed away 01/11/12 age 66   Hyperlipidemia Father    Hypertension Father    Obesity Father    Diabetes Father    Depression Father    Sleep apnea Father    Cancer Sister 62       breast   Breast cancer Sister    Sleep apnea Sister    Cancer Brother 32       prostate   Prostate cancer Brother    Atrial fibrillation Brother    Tremor Paternal Grandfather    Breast cancer Other    Prostate cancer Other    Colon cancer Neg Hx    Esophageal cancer Neg Hx    Rectal cancer Neg Hx    Stomach cancer Neg Hx    Social History   Tobacco Use   Smoking status: Never   Smokeless tobacco: Never  Substance Use Topics   Alcohol use: Yes    Alcohol/week: 3.0 standard  drinks of alcohol    Types: 1 Glasses of wine, 2 Cans of beer per week   Allergies  Allergen Reactions   Lisinopril  Cough   Current Outpatient Medications  Medication Sig Dispense Refill   amoxicillin -clavulanate (AUGMENTIN ) 875-125 MG tablet Take 1 tablet by mouth 2 (two) times daily for 10 days. 20 tablet 0   aspirin  EC 81 MG tablet Take 1 tablet (81 mg total) by mouth 2 (two) times daily. (Patient taking differently: Take 81 mg by mouth at bedtime.) 60 tablet 0   atorvastatin  (LIPITOR)  40 MG tablet Take 1 tablet (40 mg total) by mouth daily. 90 tablet 1   azelastine  (ASTELIN ) 0.1 % nasal spray Place 1 spray into both nostrils 2 (two) times daily. Use in each nostril as directed 30 mL 12   calcium  carbonate (TUMS - DOSED IN MG ELEMENTAL CALCIUM ) 500 MG chewable tablet Chew 1,000 mg by mouth daily as needed for indigestion or heartburn.     clindamycin  (CLEOCIN -T) 1 % lotion Apply topically 2 (two) times daily. (Patient taking differently: Apply 1 Application topically 2 (two) times daily as needed (infected hair follicles).) 60 mL 2   Coenzyme Q10 (COQ10) 200 MG CAPS Take 200 mg by mouth at bedtime.     Cyanocobalamin  (B-12) 1000 MCG CAPS Take 1 tablet by mouth daily.     famotidine (PEPCID) 20 MG tablet Take 20 mg by mouth at bedtime.     fluocinonide  ointment (LIDEX ) 0.05 % Apply 1 application topically daily as needed (for bites). 30 g 3   fluticasone  (FLONASE ) 50 MCG/ACT nasal spray Place 2 sprays into both nostrils daily as needed for allergies.      Ketotifen Fumarate (ALLERGY EYE DROPS OP) Place 1 drop into both eyes daily as needed (allergies).     Magnesium  250 MG TABS Take 250 mg by mouth daily.     meloxicam  (MOBIC ) 15 MG tablet Take 1 tablet (15 mg total) by mouth daily. 90 tablet 3   metFORMIN  (GLUCOPHAGE ) 500 MG tablet Take 1 tablet (500 mg total) by mouth daily with breakfast. 90 tablet 0   montelukast  (SINGULAIR ) 10 MG tablet Take 1 tablet (10 mg total) by mouth at bedtime.  30 tablet 3   pantoprazole  (PROTONIX ) 40 MG tablet Take 1 tablet (40 mg total) by mouth daily. 90 tablet 1   tamsulosin  (FLOMAX ) 0.4 MG CAPS capsule TAKE ONE CAPSULE BY MOUTH DAILY AFTER DINNER 90 capsule 1   tirzepatide  (MOUNJARO ) 7.5 MG/0.5ML Pen Inject 7.5 mg into the skin once a week. 6 mL 0   valsartan  (DIOVAN ) 80 MG tablet Take 1 tablet (80 mg total) by mouth daily. 90 tablet 1   Vitamin D , Ergocalciferol , (DRISDOL ) 1.25 MG (50000 UNIT) CAPS capsule TAKE ONE CAPSULE BY MOUTH EVERY 7 DAYS 12 capsule 0   loratadine  (CLARITIN ) 10 MG tablet Take 10 mg by mouth daily. (Patient not taking: Reported on 12/06/2023)     No current facility-administered medications for this visit.   BP 106/69 (BP Location: Left Arm, Patient Position: Sitting, Cuff Size: Large)   Pulse 65   Ht 5' 8 (1.727 m)   Wt 204 lb (92.5 kg)   SpO2 94%   BMI 31.02 kg/m   PHYSICAL EXAM:  BP 106/69 (BP Location: Left Arm, Patient Position: Sitting, Cuff Size: Large)   Pulse 65   Ht 5' 8 (1.727 m)   Wt 204 lb (92.5 kg)   SpO2 94%   BMI 31.02 kg/m    Salient findings:  CN II-XII intact Bilateral EAC clear and TM intact with well pneumatized middle ear spaces Nose: Anterior rhinoscopy reveals septal deviation right, bilateral inferior turbinate hypertrophy.  Nasal endoscopy was indicated to better evaluate the nose and paranasal sinuses, given the patient's history and exam findings, and is detailed below. No lesions of oral cavity/oropharynx; dentition fair, some caries No obviously palpable neck masses/lymphadenopathy/thyromegaly No respiratory distress or stridor   PROCEDURE:  Prior to initiating any procedures, risks/benefits/alternatives were explained to the patient and verbal consent obtained. Diagnostic Nasal Endoscopy Pre-procedure diagnosis:  Concern for chronic sinusitis Post-procedure diagnosis: same Indication: See pre-procedure diagnosis and physical exam above Complications: None apparent EBL: 0  mL Anesthesia: Lidocaine  4% and topical decongestant was topically sprayed in each nasal cavity  Description of Procedure:  Patient was identified. A rigid 30 degree endoscope was utilized to evaluate the sinonasal cavities, mucosa, sinus ostia and turbinates and septum.  Overall, signs of mucosal inflammation are noted.  No polyps, or masses noted.   Right Middle meatus: purulent drainage into NP Right SE Recess: clear Left MM: purulent drainage into NP Left SE Recess: clear  Photodocumentation was obtained.  CPT CODE -- 31231 - Mod 25   ASSESSMENT:  78 y.o. with:  1. Other chronic sinusitis   2. Hypertrophy of both inferior nasal turbinates   3. Nasal congestion   4. Nasal septal deviation    Bilateral odontogenic sinusitis in [facility] pattern. Persistent despite medical management We've discussed issues and options today.  We reviewed the nasal endoscopy images together.  The risks, benefits and alternatives were discussed and questions answered.   We will work on dental work and FESS to clear and improve his current symptoms concurrently.  We discussed the goals of sinus surgery, and expectations for postoperative management. We discussed R/B/A including pain, infection, bleeding (~3% risk of operative visit for control), persistent symptoms, need for revision surgery, and other risks including damage to the eye and loss of vision, and injury to skull base with risk of CSF leak and additional intracranial complications (<1%), anesthetic complications, among others.  Patient understands and is ready to proceed.  Will need CT Brainlab prior for pre-op planning  1) Continue flonase  and astelin  BID 2) Daily sinus rinses 3) Augmentin  BID x10d 4) CT brainlab 5) Contact dentist re: molars - Schedule for b/l FESS, inferior turbinate reduction, possible (unlikely septoplasty) F/u POD 7; needs pre-op phone visit 10 days prior to surgery; hold ASA 81 and Mounjaro  7 days prior to surgery;  hold CPAP 1 week after  See below regarding exact medications prescribed this encounter including dosages and route: Meds ordered this encounter  Medications   amoxicillin -clavulanate (AUGMENTIN ) 875-125 MG tablet    Sig: Take 1 tablet by mouth 2 (two) times daily for 10 days.    Dispense:  20 tablet    Refill:  0     Thank you for allowing me the opportunity to care for your patient. Please do not hesitate to contact me should you have any other questions.  Sincerely, Eldora Blanch, MD Otolaryngologist (ENT), [facility] Phone: [phone] Fax: [phone]  MDM:  Level 4: [phone] Complexity/Problems addressed: mod - chronic problems with exacerbation Data complexity: high - independent interpretation of imaging; review of notes, labs, ordering test - Morbidity: mod - dec for surgery  - Prescription Drug prescribed or managed: y  12/06/2023, 3:02 PM

## 2023-12-06 NOTE — Telephone Encounter (Signed)
 The patient called in with a question.  He understands that he needs to get his wisdom teeth removed He also wants to know if he should have his other tooth removed as well?  This third tooth is a molar on the top right side that he says started his sinus pain.  Please advise.

## 2023-12-06 NOTE — Patient Instructions (Addendum)
 I have ordered an imaging study for you to complete prior to your next visit. Please call [facility] Scheduling at [phone] to schedule your imaging if you have not received a call within 24 hours. If you are unable to complete your imaging study prior to your next scheduled visit please call our office to let us  know.   Continue flonase  and astelin 

## 2023-12-12 ENCOUNTER — Ambulatory Visit (INDEPENDENT_AMBULATORY_CARE_PROVIDER_SITE_OTHER): Admitting: Family Medicine

## 2023-12-14 ENCOUNTER — Ambulatory Visit (HOSPITAL_COMMUNITY)
Admission: RE | Admit: 2023-12-14 | Discharge: 2023-12-14 | Disposition: A | Discharge status: Home / Self Care | Source: Ambulatory Visit | Attending: Otolaryngology | Admitting: Otolaryngology

## 2023-12-14 DIAGNOSIS — J328 Other chronic sinusitis: Secondary | ICD-10-CM | POA: Insufficient documentation

## 2023-12-15 ENCOUNTER — Encounter: Payer: Self-pay | Admitting: Family Medicine

## 2023-12-15 ENCOUNTER — Telehealth (INDEPENDENT_AMBULATORY_CARE_PROVIDER_SITE_OTHER): Payer: Self-pay | Admitting: Otolaryngology

## 2023-12-15 NOTE — Telephone Encounter (Signed)
 Pt wife called had some questions about the referral please advise

## 2023-12-18 ENCOUNTER — Encounter (INDEPENDENT_AMBULATORY_CARE_PROVIDER_SITE_OTHER): Payer: Self-pay | Admitting: Family Medicine

## 2023-12-19 ENCOUNTER — Ambulatory Visit (INDEPENDENT_AMBULATORY_CARE_PROVIDER_SITE_OTHER): Admitting: Family Medicine

## 2023-12-19 ENCOUNTER — Encounter (INDEPENDENT_AMBULATORY_CARE_PROVIDER_SITE_OTHER): Payer: Self-pay

## 2023-12-19 ENCOUNTER — Encounter (INDEPENDENT_AMBULATORY_CARE_PROVIDER_SITE_OTHER): Payer: Self-pay | Admitting: Family Medicine

## 2023-12-27 ENCOUNTER — Telehealth (INDEPENDENT_AMBULATORY_CARE_PROVIDER_SITE_OTHER): Payer: Self-pay | Admitting: Otolaryngology

## 2023-12-27 DIAGNOSIS — J328 Other chronic sinusitis: Secondary | ICD-10-CM

## 2023-12-27 NOTE — Telephone Encounter (Signed)
 Referral made

## 2023-12-29 ENCOUNTER — Other Ambulatory Visit (INDEPENDENT_AMBULATORY_CARE_PROVIDER_SITE_OTHER): Payer: Self-pay | Admitting: Family Medicine

## 2023-12-29 DIAGNOSIS — E1169 Type 2 diabetes mellitus with other specified complication: Secondary | ICD-10-CM

## 2024-01-05 ENCOUNTER — Encounter (INDEPENDENT_AMBULATORY_CARE_PROVIDER_SITE_OTHER): Payer: Self-pay | Admitting: Family Medicine

## 2024-01-06 DIAGNOSIS — S61211A Laceration without foreign body of left index finger without damage to nail, initial encounter: Secondary | ICD-10-CM | POA: Diagnosis not present

## 2024-01-16 ENCOUNTER — Ambulatory Visit (INDEPENDENT_AMBULATORY_CARE_PROVIDER_SITE_OTHER): Admitting: Family Medicine

## 2024-01-16 ENCOUNTER — Encounter (INDEPENDENT_AMBULATORY_CARE_PROVIDER_SITE_OTHER): Payer: Self-pay | Admitting: Family Medicine

## 2024-01-16 VITALS — BP 96/61 | HR 57 | Temp 98.2°F | Ht 68.0 in | Wt 202.0 lb

## 2024-01-16 DIAGNOSIS — E119 Type 2 diabetes mellitus without complications: Secondary | ICD-10-CM

## 2024-01-16 DIAGNOSIS — E559 Vitamin D deficiency, unspecified: Secondary | ICD-10-CM

## 2024-01-16 DIAGNOSIS — Z7984 Long term (current) use of oral hypoglycemic drugs: Secondary | ICD-10-CM

## 2024-01-16 DIAGNOSIS — Z6830 Body mass index (BMI) 30.0-30.9, adult: Secondary | ICD-10-CM | POA: Diagnosis not present

## 2024-01-16 DIAGNOSIS — E66813 Obesity, class 3: Secondary | ICD-10-CM

## 2024-01-16 DIAGNOSIS — Z7985 Long-term (current) use of injectable non-insulin antidiabetic drugs: Secondary | ICD-10-CM | POA: Diagnosis not present

## 2024-01-16 DIAGNOSIS — E669 Obesity, unspecified: Secondary | ICD-10-CM | POA: Diagnosis not present

## 2024-01-16 DIAGNOSIS — J329 Chronic sinusitis, unspecified: Secondary | ICD-10-CM | POA: Diagnosis not present

## 2024-01-16 DIAGNOSIS — E1169 Type 2 diabetes mellitus with other specified complication: Secondary | ICD-10-CM

## 2024-01-16 MED ORDER — METFORMIN HCL 500 MG PO TABS: 500.0000 mg | ORAL_TABLET | Freq: Every day | ORAL | 0 refills | Status: AC

## 2024-01-16 MED ORDER — TIRZEPATIDE 10 MG/0.5ML ~~LOC~~ SOAJ: 10.0000 mg | SUBCUTANEOUS | 0 refills | Status: DC

## 2024-01-16 MED ORDER — VITAMIN D (ERGOCALCIFEROL) 1.25 MG (50000 UNIT) PO CAPS: ORAL_CAPSULE | ORAL | 0 refills | Status: AC

## 2024-01-16 NOTE — Progress Notes (Signed)
 Office: [phone]  /  Fax: [phone]  WEIGHT SUMMARY AND BIOMETRICS  Anthropometric Measurements Height: 5' 8 (1.727 m) Weight: 202 lb (91.6 kg) BMI (Calculated): 30.72 Weight at Last Visit: 205 lb Weight Lost Since Last Visit: 3 lb Weight Gained Since Last Visit: 0 Starting Weight: 288 lb Total Weight Loss (lbs): 86 lb (39 kg) Peak Weight: 288 lb   Body Composition  Body Fat %: 36.9 % Fat Mass (lbs): 74.6 lbs Muscle Mass (lbs): 121 lbs Total Body Water  (lbs): 93.8 lbs Visceral Fat Rating : 22   Other Clinical Data Fasting: yes Labs: no Today's Visit #: 75 Starting Date: 12/14/17    Chief Complaint: OBESITY   Discussed the use of AI scribe software for clinical note transcription with the patient, who gave verbal consent to proceed.  History of Present Illness Eric [name] is a 78 year old male who presents for obesity treatment and progress assessment.  He has been following his category three eating plan about fifty percent of the time, focusing on increasing protein intake and consuming more fruits and vegetables. He ensures not to skip meals and maintains adequate hydration. He generally sleeps seven to nine hours most nights. His exercise routine includes twenty minutes of activity three days a week, with an increase in strengthening exercises such as stair climbing, leg raises, and weights. Over the last two months, he has lost three pounds.  His type 2 diabetes is managed with metformin  500 mg in the morning and Mounjaro  7.5 mg weekly. His most recent hemoglobin A1c was well controlled at 5.4. He is being treated for vitamin D  deficiency with prescription ergocalciferol  50,000 IU weekly and requests a refill.  He has a sinus infection that began in March with a common cold, no fever, and muscle aches. He has been treated three times with amoxicillin , initially prescribed by a dentist for a toothache. He reports that a CT scan was performed  and he was told it showed chronic sinusitis. He uses an allergy spray and takes Allegra and Claritin . He is scheduled for sinus surgery on December 8. He experiences difficulty breathing through the right nostril and a persistent 'horrible smell' in his nose, affecting his smell and taste.  He sustained a finger laceration while cutting bushes, requiring six to seven stitches and a tetanus shot, received ten days ago at a walk-in clinic.  He traveled to [place]  and [place] , visiting his sister who has cognitive and mobility issues. She is cared for at home by her husband and a daytime caregiver. He notes her good appetite but some difficulty with vision and walking.      PHYSICAL EXAM:  Blood pressure 96/61, pulse (!) 57, temperature 98.2 F (36.8 C), height 5' 8 (1.727 m), weight 202 lb (91.6 kg), SpO2 96%. Body mass index is 30.71 kg/m.  DIAGNOSTIC DATA REVIEWED:  BMET    Component Value Date/Time   NA 141 06/28/2023 1042   K 4.8 06/28/2023 1042   CL 103 06/28/2023 1042   CO2 23 06/28/2023 1042   GLUCOSE 74 06/28/2023 1042   GLUCOSE 97 09/22/2022 0902   BUN 16 06/28/2023 1042   CREATININE 1.01 06/28/2023 1042   CREATININE 0.93 04/23/2016 1633   CALCIUM  9.6 06/28/2023 1042   GFRNONAA >60 09/22/2022 0902   GFRAA 74 10/22/2019 1243   Lab Results  Component Value Date   HGBA1C 5.3 06/28/2023   HGBA1C 5.5 03/05/2015   Lab Results  Component Value Date   INSULIN   3.6 06/28/2023   INSULIN  21.3 12/14/2017   Lab Results  Component Value Date   TSH 0.62 09/09/2022   CBC    Component Value Date/Time   WBC 6.1 03/01/2023 1049   WBC 6.6 09/22/2022 0902   RBC 4.86 03/01/2023 1049   RBC 4.99 09/22/2022 0902   HGB 15.1 03/01/2023 1049   HCT 46.3 03/01/2023 1049   PLT 191 03/01/2023 1049   MCV 95 03/01/2023 1049   MCH 31.1 03/01/2023 1049   MCH 31.1 09/22/2022 0902   MCHC 32.6 03/01/2023 1049   MCHC 32.4 09/22/2022 0902   RDW 13.2 03/01/2023 1049   Iron Studies No  results found for: IRON, TIBC, FERRITIN, IRONPCTSAT Lipid Panel     Component Value Date/Time   CHOL 118 06/28/2023 1042   TRIG 70 06/28/2023 1042   HDL 45 06/28/2023 1042   CHOLHDL 3 09/09/2022 0927   VLDL 13.6 09/09/2022 0927   LDLCALC 58 06/28/2023 1042   Hepatic Function Panel     Component Value Date/Time   PROT 6.4 06/28/2023 1042   ALBUMIN 4.5 06/28/2023 1042   AST 13 06/28/2023 1042   ALT 10 06/28/2023 1042   ALKPHOS 84 06/28/2023 1042   BILITOT 0.7 06/28/2023 1042   BILIDIR 0.2 06/07/2014 0909      Component Value Date/Time   TSH 0.62 09/09/2022 0927   Nutritional Lab Results  Component Value Date   VD25OH 85.3 06/28/2023   VD25OH 67.8 03/01/2023   VD25OH 77.43 09/09/2022     Assessment and Plan Assessment & Plan Obesity Obesity management is ongoing with dietary modifications and exercise. He follows his category three eating plan about 50% of the time, increases protein intake, and consumes more fruits and vegetables. He is not skipping meals and hydrates adequately. Sleep is generally adequate at 7-9 hours most nights. Exercise includes 20 minutes three days a week with increased strengthening exercises such as stair climbing, leg raises, and weights. He has lost 3 pounds in the last two months. Discussion on the importance of maintaining muscle mass and the role of protein intake in preventing muscle loss. Encouragement to continue challenging muscles to prevent further loss and promote muscle gain. - Continue category three eating plan - Increase Mounjaro  to 10 mg weekly - Encourage continued exercise and strengthening activities - Monitor weight and muscle mass  Type 2 diabetes mellitus, well controlled Type 2 diabetes is well controlled with a recent hemoglobin A1c of 5.4. Current treatment includes metformin  500 mg in the morning and Mounjaro  7.5 mg weekly, which will be increased to 10 mg weekly. Discussion on the benefits of GLP-1 medications  like Mounjaro , including potential repair of pancreatic beta cells and reduced risk of heart attacks and kidney protection. Emphasis on the importance of maintaining good glycemic control for overall health benefits. - Increase Mounjaro  to 10 mg weekly - Continue metformin  500 mg in the morning - Monitor blood glucose levels - Discuss potential need to pause Mounjaro  pre-surgery  Chronic sinusitis Chronic sinusitis diagnosed via CT scan, with symptoms persisting since March. Previous treatments included amoxicillin , which was not effective. ENT consultation revealed potential polyps and impacted wisdom teeth affecting sinuses. Scheduled for sinus surgery on December 8 to address sinus blockage and potential polyps. Discussion on the impact of sinusitis on smell and taste, and the presence of pus indicating infection. - Proceed with scheduled sinus surgery on December 8 - Follow ENT recommendations for sinus management  Vitamin D  deficiency Vitamin D  deficiency is being  managed with ergocalciferol  50,000 IU weekly. He reports having a sufficient supply of vitamin D  and is taking B12 over the counter. Discussion on the importance of monitoring vitamin D  levels to avoid over-replacement. - Continue ergocalciferol  50,000 IU weekly - Monitor vitamin D  levels - Continue B12 supplementation as needed      Nakai was counseled on the importance of maintaining healthy lifestyle habits, including balanced nutrition, regular physical activity, and behavioral modifications, while taking antiobesity medication.  Patient verbalized understanding that medication is an adjunct to, not a replacement for, lifestyle changes and that the long-term success and weight maintenance depend on continued adherence to these strategies.   Oree was informed of the importance of frequent follow up visits to maximize his success with intensive lifestyle modifications for his obesity and obesity related health conditions  as recommended by USPSTF and CMS guidelines   Louann Penton, MD

## 2024-01-20 ENCOUNTER — Other Ambulatory Visit: Payer: Self-pay

## 2024-01-20 DIAGNOSIS — N400 Enlarged prostate without lower urinary tract symptoms: Secondary | ICD-10-CM

## 2024-01-20 MED ORDER — TAMSULOSIN HCL 0.4 MG PO CAPS: 0.4000 mg | ORAL_CAPSULE | Freq: Every day | ORAL | 1 refills | Status: AC

## 2024-02-15 ENCOUNTER — Ambulatory Visit (INDEPENDENT_AMBULATORY_CARE_PROVIDER_SITE_OTHER): Admitting: Otolaryngology

## 2024-02-15 DIAGNOSIS — R0981 Nasal congestion: Secondary | ICD-10-CM

## 2024-02-15 DIAGNOSIS — J328 Other chronic sinusitis: Secondary | ICD-10-CM

## 2024-02-15 DIAGNOSIS — J342 Deviated nasal septum: Secondary | ICD-10-CM

## 2024-02-15 DIAGNOSIS — J343 Hypertrophy of nasal turbinates: Secondary | ICD-10-CM

## 2024-02-20 ENCOUNTER — Telehealth (INDEPENDENT_AMBULATORY_CARE_PROVIDER_SITE_OTHER): Payer: Self-pay | Admitting: Otolaryngology

## 2024-02-20 ENCOUNTER — Other Ambulatory Visit: Payer: Self-pay

## 2024-02-20 ENCOUNTER — Encounter (HOSPITAL_BASED_OUTPATIENT_CLINIC_OR_DEPARTMENT_OTHER): Payer: Self-pay

## 2024-02-20 ENCOUNTER — Encounter (HOSPITAL_BASED_OUTPATIENT_CLINIC_OR_DEPARTMENT_OTHER)
Admission: RE | Admit: 2024-02-20 | Discharge: 2024-02-20 | Disposition: A | Source: Ambulatory Visit | Attending: Otolaryngology

## 2024-02-20 DIAGNOSIS — Z01818 Encounter for other preprocedural examination: Secondary | ICD-10-CM | POA: Diagnosis not present

## 2024-02-20 LAB — BASIC METABOLIC PANEL WITH GFR
Anion gap: 9 (ref 5–15)
BUN: 28 mg/dL — ABNORMAL HIGH (ref 8–23)
CO2: 28 mmol/L (ref 22–32)
Calcium: 9.9 mg/dL (ref 8.9–10.3)
Chloride: 103 mmol/L (ref 98–111)
Creatinine, Ser: 0.88 mg/dL (ref 0.61–1.24)
GFR, Estimated: >60 mL/min (ref >60)
Glucose, Bld: 111 mg/dL — ABNORMAL HIGH (ref 70–99)
Potassium: 4.2 mmol/L (ref 3.5–5.1)
Sodium: 140 mmol/L (ref 135–145)

## 2024-02-20 NOTE — Telephone Encounter (Signed)
 The patient called requesting a pre-op appointment with Dr Tobie, he did not speak with him on weds 02/15/24.  Please advise on timing.

## 2024-02-21 ENCOUNTER — Encounter (INDEPENDENT_AMBULATORY_CARE_PROVIDER_SITE_OTHER): Payer: Self-pay | Admitting: Otolaryngology

## 2024-02-21 MED ORDER — PREDNISONE 20 MG PO TABS: 20.0000 mg | ORAL_TABLET | Freq: Every day | ORAL | 0 refills | Status: AC

## 2024-02-21 MED ORDER — AMOXICILLIN-POT CLAVULANATE 875-125 MG PO TABS: 1.0000 | ORAL_TABLET | Freq: Two times a day (BID) | ORAL | 0 refills | Status: AC

## 2024-02-22 DIAGNOSIS — Z85828 Personal history of other malignant neoplasm of skin: Secondary | ICD-10-CM | POA: Diagnosis not present

## 2024-02-22 DIAGNOSIS — D485 Neoplasm of uncertain behavior of skin: Secondary | ICD-10-CM | POA: Diagnosis not present

## 2024-02-22 DIAGNOSIS — L570 Actinic keratosis: Secondary | ICD-10-CM | POA: Diagnosis not present

## 2024-02-22 DIAGNOSIS — L814 Other melanin hyperpigmentation: Secondary | ICD-10-CM | POA: Diagnosis not present

## 2024-02-22 DIAGNOSIS — D235 Other benign neoplasm of skin of trunk: Secondary | ICD-10-CM | POA: Diagnosis not present

## 2024-02-22 DIAGNOSIS — Z08 Encounter for follow-up examination after completed treatment for malignant neoplasm: Secondary | ICD-10-CM | POA: Diagnosis not present

## 2024-02-22 DIAGNOSIS — D1801 Hemangioma of skin and subcutaneous tissue: Secondary | ICD-10-CM | POA: Diagnosis not present

## 2024-02-23 ENCOUNTER — Encounter (INDEPENDENT_AMBULATORY_CARE_PROVIDER_SITE_OTHER): Payer: Self-pay | Admitting: Family Medicine

## 2024-02-23 ENCOUNTER — Ambulatory Visit (INDEPENDENT_AMBULATORY_CARE_PROVIDER_SITE_OTHER): Payer: Self-pay | Admitting: Family Medicine

## 2024-02-23 VITALS — BP 93/53 | HR 61 | Temp 97.9°F | Ht 68.0 in | Wt 198.0 lb

## 2024-02-23 DIAGNOSIS — K5900 Constipation, unspecified: Secondary | ICD-10-CM

## 2024-02-23 DIAGNOSIS — E538 Deficiency of other specified B group vitamins: Secondary | ICD-10-CM

## 2024-02-23 DIAGNOSIS — E785 Hyperlipidemia, unspecified: Secondary | ICD-10-CM

## 2024-02-23 DIAGNOSIS — R531 Weakness: Secondary | ICD-10-CM

## 2024-02-23 DIAGNOSIS — M4800 Spinal stenosis, site unspecified: Secondary | ICD-10-CM

## 2024-02-23 DIAGNOSIS — Z6830 Body mass index (BMI) 30.0-30.9, adult: Secondary | ICD-10-CM | POA: Diagnosis not present

## 2024-02-23 DIAGNOSIS — R6889 Other general symptoms and signs: Secondary | ICD-10-CM | POA: Diagnosis not present

## 2024-02-23 DIAGNOSIS — E559 Vitamin D deficiency, unspecified: Secondary | ICD-10-CM | POA: Diagnosis not present

## 2024-02-23 DIAGNOSIS — Z7984 Long term (current) use of oral hypoglycemic drugs: Secondary | ICD-10-CM

## 2024-02-23 DIAGNOSIS — Z794 Long term (current) use of insulin: Secondary | ICD-10-CM | POA: Diagnosis not present

## 2024-02-23 DIAGNOSIS — E119 Type 2 diabetes mellitus without complications: Secondary | ICD-10-CM

## 2024-02-23 DIAGNOSIS — E1169 Type 2 diabetes mellitus with other specified complication: Secondary | ICD-10-CM | POA: Diagnosis not present

## 2024-02-23 DIAGNOSIS — E669 Obesity, unspecified: Secondary | ICD-10-CM

## 2024-02-23 DIAGNOSIS — Z7985 Long-term (current) use of injectable non-insulin antidiabetic drugs: Secondary | ICD-10-CM | POA: Diagnosis not present

## 2024-02-23 MED ORDER — TIRZEPATIDE 10 MG/0.5ML ~~LOC~~ SOAJ: 10.0000 mg | SUBCUTANEOUS | 0 refills | Status: DC

## 2024-02-23 NOTE — Progress Notes (Signed)
 Office: [phone]  /  Fax: [phone]  WEIGHT SUMMARY AND BIOMETRICS  Anthropometric Measurements Height: 5' 8 (1.727 m) Weight: 198 lb (89.8 kg) BMI (Calculated): 30.11 Weight at Last Visit: 202 lb Weight Lost Since Last Visit: 4 lb Weight Gained Since Last Visit: 0 Starting Weight: 288 lb Total Weight Loss (lbs): 90 lb (40.8 kg) Peak Weight: 288 lb   Body Composition  Body Fat %: 36.8 % Fat Mass (lbs): 73 lbs Muscle Mass (lbs): 119.2 lbs Total Body Water  (lbs): 91 lbs Visceral Fat Rating : 21   Other Clinical Data Fasting: yes Labs: no Today's Visit #: 46 Starting Date: 12/14/17    Chief Complaint: OBESITY   Discussed the use of AI scribe software for clinical note transcription with the patient, who gave verbal consent to proceed.  History of Present Illness Eric [name] is a 78 year old male who presents for obesity treatment and progress assessment.  He is following the category three eating plan about thirty percent of the time, focusing on increasing protein intake and consuming more fruits and vegetables. He generally gets seven to nine hours of sleep per night and is working on adequate hydration. However, he sometimes skips meals. Physical activity has increased, particularly during deer hunting season, resulting in a four-pound weight loss over the last five weeks since his last visit.  He is being treated for type 2 diabetes with metformin  500 mg daily and Mounjaro  10 mg subcutaneously weekly. He requests a refill for his Mounjaro  and is due for lab checks for diabetes management.  He is also being treated for vitamin D  deficiency with ergocalciferol  50,000 IU weekly and is due for lab checks to monitor this condition.  Hyperlipidemia is managed with diet, exercise, weight loss, and Lipitor 40 mg. He is due for lab checks to assess his lipid levels.  He has a B12 deficiency and is taking a B12 supplement of 1,000 mcg daily, along with  a B12-rich diet. He is due for lab checks to monitor his B12 levels.  Chronic back pain is present, primarily in the morning upon getting out of bed, requiring frequent breaks when working outside. He previously considered L3 ablation but reports that the procedure was not recommended to him due to his reported pain level.  He notes a decrease in muscle mass and strength, particularly in his legs, which he attributes to less physical activity. He has been preparing for deer hunting by climbing stairs and working in the yard, but reports his legs feel weaker.  He experiences trouble with smell and taste, noting he can taste sweet and salt but has a distorted sense of smell.  He is intolerant to cold, which he attributes to loss of muscle mass and aging.  He mentions constipation, for which he uses Miralax , but struggles to find the right dose.  He has a history of thyroid  nodules, which were biopsied and found to be benign about five years ago.      PHYSICAL EXAM:  Blood pressure (!) 93/53, pulse 61, temperature 97.9 F (36.6 C), height 5' 8 (1.727 m), weight 198 lb (89.8 kg), SpO2 96%. Body mass index is 30.11 kg/m.  DIAGNOSTIC DATA REVIEWED:  BMET    Component Value Date/Time   NA 140 02/20/2024 1200   NA 141 06/28/2023 1042   K 4.2 02/20/2024 1200   CL 103 02/20/2024 1200   CO2 28 02/20/2024 1200   GLUCOSE 111 (H) 02/20/2024 1200   BUN 28 (  H) 02/20/2024 1200   BUN 16 06/28/2023 1042   CREATININE 0.88 02/20/2024 1200   CREATININE 0.93 04/23/2016 1633   CALCIUM  9.9 02/20/2024 1200   GFRNONAA >60 02/20/2024 1200   GFRAA 74 10/22/2019 1243   Lab Results  Component Value Date   HGBA1C 5.3 06/28/2023   HGBA1C 5.5 03/05/2015   Lab Results  Component Value Date   INSULIN  3.6 06/28/2023   INSULIN  21.3 12/14/2017   Lab Results  Component Value Date   TSH 0.62 09/09/2022   CBC    Component Value Date/Time   WBC 6.1 03/01/2023 1049   WBC 6.6 09/22/2022 0902   RBC  4.86 03/01/2023 1049   RBC 4.99 09/22/2022 0902   HGB 15.1 03/01/2023 1049   HCT 46.3 03/01/2023 1049   PLT 191 03/01/2023 1049   MCV 95 03/01/2023 1049   MCH 31.1 03/01/2023 1049   MCH 31.1 09/22/2022 0902   MCHC 32.6 03/01/2023 1049   MCHC 32.4 09/22/2022 0902   RDW 13.2 03/01/2023 1049   Iron Studies No results found for: IRON, TIBC, FERRITIN, IRONPCTSAT Lipid Panel     Component Value Date/Time   CHOL 118 06/28/2023 1042   TRIG 70 06/28/2023 1042   HDL 45 06/28/2023 1042   CHOLHDL 3 09/09/2022 0927   VLDL 13.6 09/09/2022 0927   LDLCALC 58 06/28/2023 1042   Hepatic Function Panel     Component Value Date/Time   PROT 6.4 06/28/2023 1042   ALBUMIN 4.5 06/28/2023 1042   AST 13 06/28/2023 1042   ALT 10 06/28/2023 1042   ALKPHOS 84 06/28/2023 1042   BILITOT 0.7 06/28/2023 1042   BILIDIR 0.2 06/07/2014 0909      Component Value Date/Time   TSH 0.62 09/09/2022 0927   Nutritional Lab Results  Component Value Date   VD25OH 85.3 06/28/2023   VD25OH 67.8 03/01/2023   VD25OH 77.43 09/09/2022     Assessment and Plan Assessment & Plan Obesity Managed with a category three eating plan, followed 30% of the time. He is working on increasing protein intake and consuming more fruits and vegetables. He is generally getting 7-9 hours of sleep per night and trying to hydrate adequately, though he sometimes skips meals. He has increased physical activity by walking more, especially during deer season. He has lost 4 pounds in the last five weeks. - Continue category three eating plan - Encouraged increased protein intake and consumption of fruits and vegetables - Encouraged regular physical activity  Type 2 diabetes mellitus Type 2 diabetes managed with metformin  500 mg daily and Mounjaro  10 mg subcutaneously weekly. He requests a refill of Mounjaro . Labs are due to check diabetes control. - Refilled Mounjaro  10 mg subcutaneously weekly - Ordered labs to check diabetes  control - Continue diet, exercise and weight loss as discussed today as an important part of the treatment plan   Hyperlipidemia Managed with diet, exercise, weight loss, and Lipitor 40 mg. Labs are due to check lipid levels. - Ordered labs to check lipid levels - Continue diet, exercise and weight loss as discussed today as an important part of the treatment plan   Vitamin D  deficiency Managed with prescription ergocalciferol  50,000 IU weekly. Labs are due to check vitamin D  levels. - Ordered labs to check vitamin D  levels  Vitamin B12 deficiency Managed with B12 supplement 1000 mcg daily and a B12-rich diet. Labs are due to check B12 levels. - Ordered labs to check B12 levels  Weakness and muscle loss Reports weakness  and muscle loss, particularly in the legs. He has been less active recently but has been deer hunting. Physical therapy is suggested to address muscle weakness and potential gait issues. - Ordered physical therapy to address muscle weakness and gait issues  Chronic low back pain with lumbar spondylosis and herniated disc and spinal stenosis Chronic low back pain with lumbar spondylosis and herniated disc. Previous consultations with neurosurgeons have not recommended surgery due to pain level. He experiences pain when standing for extended periods but not when sitting or lying down. - Ordered physical therapy to address back pain and potential gait issues  Constipation Managed with Miralax . He reports difficulty in adjusting the dose to manage symptoms effectively. - Continue Miralax  with dose adjustments as needed  Cold intolerance Likely due to weight loss and if so, will be self-resolving - Check TSH and follow      Patients who are on anti-obesity medications are counseled on the importance of maintaining healthy lifestyle habits, including balanced nutrition, regular physical activity, and behavioral modifications,  Medication is an adjunct to, not a  replacement for, lifestyle changes and that the long-term success and weight maintenance depend on continued adherence to these strategies.   Salomon was informed of the importance of frequent follow up visits to maximize his success with intensive lifestyle modifications for his obesity and obesity related health conditions as recommended by USPSTF and CMS guidelines    Louann Penton, MD

## 2024-02-24 ENCOUNTER — Other Ambulatory Visit: Payer: Self-pay

## 2024-02-24 DIAGNOSIS — E785 Hyperlipidemia, unspecified: Secondary | ICD-10-CM

## 2024-02-24 LAB — CMP14+EGFR
ALT: 27 IU/L (ref 0–44)
AST: 17 IU/L (ref 0–40)
Albumin: 4.4 g/dL (ref 3.8–4.8)
Alkaline Phosphatase: 78 IU/L (ref 47–123)
BUN/Creatinine Ratio: 21 (ref 10–24)
BUN: 19 mg/dL (ref 8–27)
Bilirubin Total: 1 mg/dL (ref 0.0–1.2)
CO2: 22 mmol/L (ref 20–29)
Calcium: 9.5 mg/dL (ref 8.6–10.2)
Chloride: 102 mmol/L (ref 96–106)
Creatinine, Ser: 0.92 mg/dL (ref 0.76–1.27)
Globulin, Total: 1.9 g/dL (ref 1.5–4.5)
Glucose: 82 mg/dL (ref 70–99)
Potassium: 4.2 mmol/L (ref 3.5–5.2)
Sodium: 142 mmol/L (ref 134–144)
Total Protein: 6.3 g/dL (ref 6.0–8.5)
eGFR: 86 mL/min/1.73 (ref >59)

## 2024-02-24 LAB — HEMOGLOBIN A1C
Est. average glucose Bld gHb Est-mCnc: 103 mg/dL
Hgb A1c MFr Bld: 5.2 % (ref 4.8–5.6)

## 2024-02-24 LAB — LIPID PANEL WITH LDL/HDL RATIO
Cholesterol, Total: 110 mg/dL (ref 100–199)
HDL: 51 mg/dL (ref >39)
LDL Chol Calc (NIH): 47 mg/dL (ref 0–99)
LDL/HDL Ratio: 0.9 ratio (ref 0.0–3.6)
Triglycerides: 50 mg/dL (ref 0–149)
VLDL Cholesterol Cal: 12 mg/dL (ref 5–40)

## 2024-02-24 LAB — VITAMIN B12: Vitamin B-12: 613 pg/mL (ref 232–1245)

## 2024-02-24 LAB — TSH: TSH: 0.668 u[IU]/mL (ref 0.450–4.500)

## 2024-02-24 LAB — INSULIN, RANDOM: INSULIN: 11.5 u[IU]/mL (ref 2.6–24.9)

## 2024-02-24 LAB — VITAMIN D 25 HYDROXY (VIT D DEFICIENCY, FRACTURES): Vit D, 25-Hydroxy: 53.5 ng/mL (ref 30.0–100.0)

## 2024-02-24 MED ORDER — ATORVASTATIN CALCIUM 40 MG PO TABS: 40.0000 mg | ORAL_TABLET | Freq: Every day | ORAL | 1 refills | Status: AC

## 2024-02-25 NOTE — Progress Notes (Signed)
 Dear Dr. Antonio Meth, Here is my assessment for our mutual patient, Eric [name]. Thank you for allowing me the opportunity to care for your patient. Please do not hesitate to contact me should you have any other questions. Sincerely, Dr. Eldora Blanch  Otolaryngology Clinic Note  HISTORY: Eric [name] is a 78 y.o. male kindly referred by Dr. Antonio Meth for evaluation of chronic sinusitis  Initial visit (11/2023):  He reports that he had a cold in January 2025, and he recovered from this but then had persistent bilateral discolored drainage and facial pressure bilaterally with foul smelling drainage. He reports that he did have dental pain on the right maxillary tooth for which he had dental procedure but did not resolve his symptoms. He has been on antibiotics (multiple rounds) with Dr. Antonio Meth prescribing him augmentin  and prednisone  as well as claritin , flonase , and astelin . He got some better but his symptoms continue to persist and continues to have foul smelling drainage, nasal congestion and bilateral facial pressure.  He also had a CT done and was referred here. Prior to this, he denies history of frequent sinus infections.   Improvement occurred with antibiotics, and nasal sprays.Allergy testing has not been done.  Denies typical AR symptoms. No previous sinonasal surgery.  Prior has had RAST which showed ragweed, goldenrod, BAHA grass, cats.  He is currently using flonase , astelin , and PO anthistamine and saline spray. Was using rinses prior.  He is a retired PA  --------------------------------------------------------- 01/2024 The patient gave consent to have this visit done by telemedicine / virtual visit, two identifiers were used to identify patient. This is also consent for access the chart and treat the patient via this visit. The patient is located in [place]  I, the provider, am at the office.  We spent 10 minutes together for the visit.  Joined by  telephone  Discussed upcoming procedure. I answered his questions and discussed details about procedure. He has stopped his ASA and Mounjaro . Discussed updated CT  GLP-1: Mounjaro  AP/AC: ASA 81  Tobacco: no; EtOH: prior  PMHx: HLD, HTN, DM, Multiple Joint Replacement, OSA on CPAP, Palpitations  RADIOGRAPHIC EVALUATION AND INDEPENDENT REVIEW OF OTHER RECORDS: Dr. Antonio Meth (08/29/2023): nasal congestion and yellow nasal drainage for 4 months primarily on right. Allergy testing done - ragweed, goldenrod, cats; occassional afrin, on claritin , flonase , astelin . Dx: Sinusitis;Rx: CT and ref to ENT CT Face 08/29/2023 independently interpreted: bilateral [facility] pattern sinusitis with septum deviates right and bilateral inferior turbinate hypertrophy; noted maxillary molars partially into maxillary sinus b/l CBC w/diff: WBC 6.1, Eos 0 CT [facility] 12/14/2023: agree with read, although does not appear to be polyps on right. No nasal mass on left. B/l [facility] pattern opacification Past Medical History:  Diagnosis Date   Alcohol abuse    Allergy    Back pain    Cancer (HCC)    Basal cell cancer  below left eye, had Mohs'   Cataract    forming   Colon polyps    Diverticulosis of colon    DJD (degenerative joint disease)    Dysrhythmia    Palpations from caffiene- reports he was having PACs due to slim fast wth caffeine - had full cardiac w/u and was told to cut back on caffeine;  now drink decaf bevrerages and no rpeort of recurrence    Enlarged prostate    GERD (gastroesophageal reflux disease)    Hearing loss    has HAs,   Hip osteoarthritis    Left  History of hay fever    Hx of cardiovascular stress test    Lexiscan  Myoview (04/2013):  No ischemia, EF 61%; low risk   Hyperlipemia    Hypertension    Insulin  resistance    om metfromin    Internal hemorrhoids without mention of complication    Leg edema    Neuroma of foot    left   Obesity    Osteoarthrosis, unspecified whether generalized  or localized, unspecified site    Palpitations    Pneumonia    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   Pre-diabetes    Primary localized osteoarthrosis of right shoulder 05/25/2016   Recent weight loss    75lbs   Seasonal allergies    Sleep apnea    CPAP   Thyroid  nodule    calcified thyroid  nodules- bx negative    Tremor of both hands    Past Surgical History:  Procedure Laterality Date   COLONOSCOPY     INGUINAL HERNIA REPAIR Left 03/29/2023   Procedure: OPEN LEFT INGUINAL HERNIA REPAIR WITH MESH;  Surgeon: Kinsinger, Herlene Righter, MD;  Location: [facility];  Service: General;  Laterality: Left;   JOINT REPLACEMENT  06-Mar-2017   POLYPECTOMY     SHOULDER SURGERY  2008/03/06   right   TONSILLECTOMY     TOTAL HIP ARTHROPLASTY Left 03/28/2017   Procedure: TOTAL HIP ARTHROPLASTY ANTERIOR APPROACH;  Surgeon: Liam Lerner, MD;  Location: [facility];  Service: Orthopedics;  Laterality: Left;   TOTAL HIP ARTHROPLASTY Right 09/25/2018   Procedure: Right Anterior Hip Arthroplasty;  Surgeon: Liam Lerner, MD;  Location: [facility];  Service: Orthopedics;  Laterality: Right;   TOTAL KNEE ARTHROPLASTY Right 10/04/2022   Procedure: RIGHT TOTAL KNEE ARTHROPLASTY;  Surgeon: Liam Lerner, MD;  Location: [facility];  Service: Orthopedics;  Laterality: Right;   TOTAL SHOULDER ARTHROPLASTY Left 10/14/2015   Procedure: LEFT TOTAL SHOULDER ARTHROPLASTY;  Surgeon: Fonda Olmsted, MD;  Location: [facility];  Service: Orthopedics;  Laterality: Left;   TOTAL SHOULDER ARTHROPLASTY Right 05/25/2016   Procedure: TOTAL SHOULDER ARTHROPLASTY;  Surgeon: Fonda Olmsted, MD;  Location: [facility];  Service: Orthopedics;  Laterality: Right;   Family History  Problem Relation Age of Onset   Arthritis Mother    Cancer Mother 48       breast   Hyperlipidemia Mother    Colon polyps Mother 53   Hypertension Mother    Heart disease Mother    Depression Mother    Anxiety disorder Mother    Obesity Mother    Breast cancer Mother    Heart  disease Father        CAD--passed away 2012/03/06 age 63   Hyperlipidemia Father    Hypertension Father    Obesity Father    Diabetes Father    Depression Father    Sleep apnea Father    Cancer Sister 10       breast   Breast cancer Sister    Sleep apnea Sister    Cancer Brother 82       prostate   Prostate cancer Brother    Atrial fibrillation Brother    Tremor Paternal Grandfather    Breast cancer Other    Prostate cancer Other    Colon cancer Neg Hx    Esophageal cancer Neg Hx    Rectal cancer Neg Hx    Stomach cancer Neg Hx    Social History   Tobacco Use   Smoking status: Never   Smokeless  tobacco: Never  Substance Use Topics   Alcohol use: Yes    Alcohol/week: 3.0 standard drinks of alcohol    Types: 1 Glasses of wine, 2 Cans of beer per week   Allergies  Allergen Reactions   Lisinopril  Cough   Current Outpatient Medications  Medication Sig Dispense Refill   amoxicillin -clavulanate (AUGMENTIN ) 875-125 MG tablet Take 1 tablet by mouth 2 (two) times daily for 12 days. 24 tablet 0   predniSONE  (DELTASONE ) 20 MG tablet Take 1 tablet (20 mg total) by mouth daily with breakfast for 12 days. 12 tablet 0   aspirin  EC 81 MG tablet Take 1 tablet (81 mg total) by mouth 2 (two) times daily. (Patient taking differently: Take 81 mg by mouth at bedtime.) 60 tablet 0   atorvastatin  (LIPITOR) 40 MG tablet Take 1 tablet (40 mg total) by mouth daily. 90 tablet 1   azelastine  (ASTELIN ) 0.1 % nasal spray Place 1 spray into both nostrils 2 (two) times daily. Use in each nostril as directed 30 mL 12   calcium  carbonate (TUMS - DOSED IN MG ELEMENTAL CALCIUM ) 500 MG chewable tablet Chew 1,000 mg by mouth daily as needed for indigestion or heartburn.     clindamycin  (CLEOCIN -T) 1 % lotion Apply topically 2 (two) times daily. (Patient taking differently: Apply 1 Application topically 2 (two) times daily as needed (infected hair follicles).) 60 mL 2   Coenzyme Q10 (COQ10) 200 MG CAPS Take 200 mg  by mouth at bedtime.     Cyanocobalamin  (B-12) 1000 MCG CAPS Take 1 tablet by mouth daily.     famotidine (PEPCID) 20 MG tablet Take 20 mg by mouth at bedtime.     fluocinonide  ointment (LIDEX ) 0.05 % Apply 1 application topically daily as needed (for bites). 30 g 3   fluticasone  (FLONASE ) 50 MCG/ACT nasal spray Place 2 sprays into both nostrils daily as needed for allergies.      Ketotifen Fumarate (ALLERGY EYE DROPS OP) Place 1 drop into both eyes daily as needed (allergies).     Magnesium  250 MG TABS Take 250 mg by mouth daily.     meloxicam  (MOBIC ) 15 MG tablet Take 1 tablet (15 mg total) by mouth daily. 90 tablet 3   metFORMIN  (GLUCOPHAGE ) 500 MG tablet Take 1 tablet (500 mg total) by mouth daily with breakfast. 90 tablet 0   montelukast  (SINGULAIR ) 10 MG tablet Take 1 tablet (10 mg total) by mouth at bedtime. 30 tablet 3   pantoprazole  (PROTONIX ) 40 MG tablet Take 1 tablet (40 mg total) by mouth daily. 90 tablet 1   tamsulosin  (FLOMAX ) 0.4 MG CAPS capsule Take 1 capsule (0.4 mg total) by mouth daily after supper. 90 capsule 1   tirzepatide  (MOUNJARO ) 10 MG/0.5ML Pen Inject 10 mg into the skin once a week. 6 mL 0   valsartan  (DIOVAN ) 80 MG tablet Take 1 tablet (80 mg total) by mouth daily. 90 tablet 1   Vitamin D , Ergocalciferol , (DRISDOL ) 1.25 MG (50000 UNIT) CAPS capsule TAKE ONE CAPSULE BY MOUTH EVERY 7 DAYS 12 capsule 0   No current facility-administered medications for this visit.   There were no vitals taken for this visit.  PHYSICAL EXAM:  There were no vitals taken for this visit.   Salient findings (Prior, not today -- this was a telephone visit)  CN II-XII intact Bilateral EAC clear and TM intact with well pneumatized middle ear spaces Nose: Anterior rhinoscopy reveals septal deviation right, bilateral inferior turbinate hypertrophy.  Nasal endoscopy was  indicated to better evaluate the nose and paranasal sinuses, given the patient's history and exam findings, and is detailed  below. No lesions of oral cavity/oropharynx; dentition fair, some caries No obviously palpable neck masses/lymphadenopathy/thyromegaly No respiratory distress or stridor   PROCEDURE (prior, not today)  Prior to initiating any procedures, risks/benefits/alternatives were explained to the patient and verbal consent obtained. Diagnostic Nasal Endoscopy Pre-procedure diagnosis: Concern for chronic sinusitis Post-procedure diagnosis: same Indication: See pre-procedure diagnosis and physical exam above Complications: None apparent EBL: 0 mL Anesthesia: Lidocaine  4% and topical decongestant was topically sprayed in each nasal cavity  Description of Procedure:  Patient was identified. A rigid 30 degree endoscope was utilized to evaluate the sinonasal cavities, mucosa, sinus ostia and turbinates and septum.  Overall, signs of mucosal inflammation are noted.  No polyps, or masses noted.   Right Middle meatus: purulent drainage into NP Right SE Recess: clear Left MM: purulent drainage into NP Left SE Recess: clear  Photodocumentation was obtained.  CPT CODE -- 31231 - Mod 25   ASSESSMENT:  78 y.o. with:  1. Other chronic sinusitis   2. Hypertrophy of both inferior nasal turbinates   3. Nasal congestion   4. Nasal septal deviation    Bilateral odontogenic sinusitis in [facility] pattern. Persistent despite medical management We've discussed issues and options today.  The risks, benefits and alternatives were discussed and questions answered.   We will work on dental work and FESS to clear and improve his current symptoms concurrently --- oral surgeon reported that not advisable to remove teeth despite most likely source. As such, after discussion, he opted for FESS only.  We discussed the goals of sinus surgery, and expectations for postoperative management. We discussed R/B/A including pain, infection, bleeding (~3% risk of operative visit for control), persistent symptoms, need for revision  surgery, and other risks including damage to the eye and loss of vision, and injury to skull base with risk of CSF leak and additional intracranial complications (<1%), anesthetic complications, among others.  Patient understands and is ready to proceed.  - Will do periop abx/steroids starting pre-op day 6 or 5. F/u scheduled post op  See below regarding exact medications prescribed this encounter including dosages and route: Meds ordered this encounter  Medications   predniSONE  (DELTASONE ) 20 MG tablet    Sig: Take 1 tablet (20 mg total) by mouth daily with breakfast for 12 days.    Dispense:  12 tablet    Refill:  0   amoxicillin -clavulanate (AUGMENTIN ) 875-125 MG tablet    Sig: Take 1 tablet by mouth 2 (two) times daily for 12 days.    Dispense:  24 tablet    Refill:  0     Thank you for allowing me the opportunity to care for your patient. Please do not hesitate to contact me should you have any other questions.  Sincerely, Eldora Blanch, MD Otolaryngologist (ENT), [facility] Phone: [phone] Fax: [phone]  MDM: low  02/25/2024, 11:52 AM

## 2024-02-27 ENCOUNTER — Ambulatory Visit (HOSPITAL_BASED_OUTPATIENT_CLINIC_OR_DEPARTMENT_OTHER): Admitting: Anesthesiology

## 2024-02-27 ENCOUNTER — Encounter (HOSPITAL_BASED_OUTPATIENT_CLINIC_OR_DEPARTMENT_OTHER): Admission: RE | Disposition: A | Payer: Self-pay | Source: Home / Self Care | Attending: Otolaryngology

## 2024-02-27 ENCOUNTER — Other Ambulatory Visit: Payer: Self-pay

## 2024-02-27 ENCOUNTER — Ambulatory Visit (HOSPITAL_BASED_OUTPATIENT_CLINIC_OR_DEPARTMENT_OTHER)
Admission: RE | Admit: 2024-02-27 | Discharge: 2024-02-27 | Disposition: A | Attending: Otolaryngology | Admitting: Otolaryngology

## 2024-02-27 ENCOUNTER — Encounter (HOSPITAL_BASED_OUTPATIENT_CLINIC_OR_DEPARTMENT_OTHER): Payer: Self-pay

## 2024-02-27 DIAGNOSIS — E119 Type 2 diabetes mellitus without complications: Secondary | ICD-10-CM | POA: Diagnosis not present

## 2024-02-27 DIAGNOSIS — J343 Hypertrophy of nasal turbinates: Secondary | ICD-10-CM

## 2024-02-27 DIAGNOSIS — J329 Chronic sinusitis, unspecified: Secondary | ICD-10-CM | POA: Diagnosis not present

## 2024-02-27 DIAGNOSIS — E785 Hyperlipidemia, unspecified: Secondary | ICD-10-CM | POA: Diagnosis not present

## 2024-02-27 DIAGNOSIS — J328 Other chronic sinusitis: Secondary | ICD-10-CM | POA: Diagnosis not present

## 2024-02-27 DIAGNOSIS — I10 Essential (primary) hypertension: Secondary | ICD-10-CM | POA: Diagnosis not present

## 2024-02-27 DIAGNOSIS — Z01818 Encounter for other preprocedural examination: Secondary | ICD-10-CM

## 2024-02-27 HISTORY — PX: TURBINATE REDUCTION: SHX6157

## 2024-02-27 HISTORY — PX: SINUS ENDO W/FUSION: SHX777

## 2024-02-27 LAB — GLUCOSE, CAPILLARY
Glucose-Capillary: 104 mg/dL — ABNORMAL HIGH (ref 70–99)
Glucose-Capillary: 151 mg/dL — ABNORMAL HIGH (ref 70–99)

## 2024-02-27 SURGERY — SINUS SURGERY, ENDOSCOPIC, USING COMPUTER-ASSISTED NAVIGATION
Anesthesia: General | Site: Nose

## 2024-02-27 MED ORDER — EPHEDRINE SULFATE (PRESSORS) 25 MG/5ML IV SOSY
PREFILLED_SYRINGE | INTRAVENOUS | Status: DC | PRN
  Administered 2024-02-27 (×2): 10 mg via INTRAVENOUS

## 2024-02-27 MED ORDER — PHENYLEPHRINE HCL-NACL 20-0.9 MG/250ML-% IV SOLN
INTRAVENOUS | Status: DC | PRN
  Administered 2024-02-27: 40 ug/min via INTRAVENOUS

## 2024-02-27 MED ORDER — FLUORESCEIN SODIUM 1 MG OP STRP
ORAL_STRIP | OPHTHALMIC | Status: AC
  Filled 2024-02-27: qty 1

## 2024-02-27 MED ORDER — OXYCODONE HCL 5 MG PO TABS
5.0000 mg | ORAL_TABLET | Freq: Once | ORAL | Status: AC
  Administered 2024-02-27: 5 mg via ORAL

## 2024-02-27 MED ORDER — PHENYLEPHRINE HCL (PRESSORS) 10 MG/ML IV SOLN
INTRAVENOUS | Status: DC | PRN
  Administered 2024-02-27 (×3): 80 ug via INTRAVENOUS

## 2024-02-27 MED ORDER — ACETAMINOPHEN 500 MG PO TABS
1000.0000 mg | ORAL_TABLET | Freq: Once | ORAL | Status: AC
  Administered 2024-02-27: 1000 mg via ORAL

## 2024-02-27 MED ORDER — EPINEPHRINE 0.1 % (1MG/ML) IJ FOR NASAL USE
INTRAMUSCULAR | Status: DC | PRN
  Administered 2024-02-27: 1 [drp] via TOPICAL

## 2024-02-27 MED ORDER — TRANEXAMIC ACID-NACL 1000-0.7 MG/100ML-% IV SOLN
INTRAVENOUS | Status: AC
  Filled 2024-02-27: qty 100

## 2024-02-27 MED ORDER — DEXAMETHASONE SODIUM PHOSPHATE 4 MG/ML IJ SOLN
INTRAMUSCULAR | Status: DC | PRN
  Administered 2024-02-27: 10 mg via INTRAVENOUS

## 2024-02-27 MED ORDER — ONDANSETRON HCL 4 MG/2ML IJ SOLN
INTRAMUSCULAR | Status: DC | PRN
  Administered 2024-02-27: 4 mg via INTRAVENOUS

## 2024-02-27 MED ORDER — CEFAZOLIN SODIUM-DEXTROSE 2-3 GM-%(50ML) IV SOLR
INTRAVENOUS | Status: DC | PRN
  Administered 2024-02-27: 2 g via INTRAVENOUS

## 2024-02-27 MED ORDER — SALINE SPRAY 0.65 % NA SOLN: 2.0000 | NASAL | 2 refills | Status: AC | PRN

## 2024-02-27 MED ORDER — ROCURONIUM BROMIDE 10 MG/ML (PF) SYRINGE
PREFILLED_SYRINGE | INTRAVENOUS | Status: AC
  Filled 2024-02-27: qty 10

## 2024-02-27 MED ORDER — MUPIROCIN 2 % EX OINT
TOPICAL_OINTMENT | CUTANEOUS | Status: AC
  Filled 2024-02-27: qty 22

## 2024-02-27 MED ORDER — FENTANYL CITRATE (PF) 100 MCG/2ML IJ SOLN
INTRAMUSCULAR | Status: AC
  Filled 2024-02-27: qty 2

## 2024-02-27 MED ORDER — LIDOCAINE-EPINEPHRINE 1 %-1:100000 IJ SOLN
INTRAMUSCULAR | Status: AC
  Filled 2024-02-27: qty 1

## 2024-02-27 MED ORDER — FENTANYL CITRATE (PF) 100 MCG/2ML IJ SOLN
INTRAMUSCULAR | Status: DC | PRN
  Administered 2024-02-27: 50 ug via INTRAVENOUS
  Administered 2024-02-27: 100 ug via INTRAVENOUS
  Administered 2024-02-27: 50 ug via INTRAVENOUS

## 2024-02-27 MED ORDER — SODIUM CHLORIDE 0.9 % IV SOLN
INTRAVENOUS | Status: AC | PRN
  Administered 2024-02-27: 500 mL

## 2024-02-27 MED ORDER — CEFAZOLIN SODIUM-DEXTROSE 2-4 GM/100ML-% IV SOLN
INTRAVENOUS | Status: AC
  Filled 2024-02-27: qty 100

## 2024-02-27 MED ORDER — ROCURONIUM BROMIDE 100 MG/10ML IV SOLN
INTRAVENOUS | Status: DC | PRN
  Administered 2024-02-27: 50 mg via INTRAVENOUS
  Administered 2024-02-27: 10 mg via INTRAVENOUS

## 2024-02-27 MED ORDER — AMISULPRIDE (ANTIEMETIC) 5 MG/2ML IV SOLN: 10.0000 mg | Freq: Once | INTRAVENOUS | Status: DC | PRN

## 2024-02-27 MED ORDER — LACTATED RINGERS IV SOLN: INTRAVENOUS | Status: DC

## 2024-02-27 MED ORDER — OXYCODONE HCL 5 MG PO TABS: 5.0000 mg | ORAL_TABLET | Freq: Three times a day (TID) | ORAL | 0 refills | Status: AC | PRN

## 2024-02-27 MED ORDER — FENTANYL CITRATE (PF) 100 MCG/2ML IJ SOLN: 25.0000 ug | INTRAMUSCULAR | Status: DC | PRN

## 2024-02-27 MED ORDER — LIDOCAINE 2% (20 MG/ML) 5 ML SYRINGE
INTRAMUSCULAR | Status: AC
  Filled 2024-02-27: qty 5

## 2024-02-27 MED ORDER — TRANEXAMIC ACID-NACL 1000-0.7 MG/100ML-% IV SOLN
INTRAVENOUS | Status: DC | PRN
  Administered 2024-02-27: 1000 mg via INTRAVENOUS

## 2024-02-27 MED ORDER — PROPOFOL 10 MG/ML IV BOLUS
INTRAVENOUS | Status: DC | PRN
  Administered 2024-02-27: 150 mg via INTRAVENOUS
  Administered 2024-02-27: 50 mg via INTRAVENOUS

## 2024-02-27 MED ORDER — ACETAMINOPHEN 500 MG PO TABS: 1000.0000 mg | ORAL_TABLET | Freq: Four times a day (QID) | ORAL | 2 refills | Status: AC | PRN

## 2024-02-27 MED ORDER — 0.9 % SODIUM CHLORIDE (POUR BTL) OPTIME
TOPICAL | Status: DC | PRN
  Administered 2024-02-27: 600 mL

## 2024-02-27 MED ORDER — SUGAMMADEX SODIUM 200 MG/2ML IV SOLN
INTRAVENOUS | Status: DC | PRN
  Administered 2024-02-27: 200 mg via INTRAVENOUS

## 2024-02-27 MED ORDER — LIDOCAINE-EPINEPHRINE 1 %-1:100000 IJ SOLN
INTRAMUSCULAR | Status: DC | PRN
  Administered 2024-02-27: 10 mL

## 2024-02-27 MED ORDER — ONDANSETRON HCL 4 MG/2ML IJ SOLN
INTRAMUSCULAR | Status: AC
  Filled 2024-02-27: qty 2

## 2024-02-27 MED ORDER — FLUORESCEIN SODIUM 1 MG OP STRP
ORAL_STRIP | OPHTHALMIC | Status: DC | PRN
  Administered 2024-02-27: 1

## 2024-02-27 MED ORDER — LIDOCAINE HCL (CARDIAC) PF 100 MG/5ML IV SOSY
PREFILLED_SYRINGE | INTRAVENOUS | Status: DC | PRN
  Administered 2024-02-27: 60 mg via INTRAVENOUS

## 2024-02-27 MED ORDER — OXYCODONE HCL 5 MG PO TABS
ORAL_TABLET | ORAL | Status: AC
  Filled 2024-02-27: qty 1

## 2024-02-27 MED ORDER — ONDANSETRON HCL 4 MG/2ML IJ SOLN: 4.0000 mg | Freq: Once | INTRAMUSCULAR | Status: DC | PRN

## 2024-02-27 MED ORDER — ACETAMINOPHEN 500 MG PO TABS
ORAL_TABLET | ORAL | Status: AC
  Filled 2024-02-27: qty 2

## 2024-02-27 SURGICAL SUPPLY — 71 items
BALLOON FRONTAL NUVENT 6X17 (BALLOONS) IMPLANT
BALLOON FRONTAL NVNT 6X17 70D (BALLOONS) IMPLANT
BLADE INF TURB ROT M4 2 5PK (BLADE) IMPLANT
BLADE NAVIG QUADCUT 4.3X13 M4 (BLADE) ×2 IMPLANT
BLADE PED QUAD 3.4 EM (BLADE) IMPLANT
BLADE RAD60 ROTATE M4 4 5PK (BLADE) IMPLANT
BLADE ROTATE RAD 40 4 M4 (BLADE) IMPLANT
BLADE ROTATE TRICUT 4X13 M4 (BLADE) IMPLANT
BLADE SHAVER TURBINATE 11X2.9 (BLADE) IMPLANT
BLADE SURG 15 STRL LF DISP TIS (BLADE) IMPLANT
CANISTER SUC SOCK COL 7IN (MISCELLANEOUS) ×2 IMPLANT
CANISTER SUCT 1200ML W/VALVE (MISCELLANEOUS) ×4 IMPLANT
COAGULATOR SUCT 8FR VV (MISCELLANEOUS) IMPLANT
DEFOGGER MIRROR 1QT (MISCELLANEOUS) IMPLANT
DRESSING NASAL KENNEDY 3.5X.9 (MISCELLANEOUS) IMPLANT
DRSG NASOPORE 8CM (GAUZE/BANDAGES/DRESSINGS) IMPLANT
DRSG TELFA 3X8 NADH STRL (GAUZE/BANDAGES/DRESSINGS) IMPLANT
ELECT COATED BLADE 2.86 ST (ELECTRODE) IMPLANT
ELECTRODE REM PT RTRN 9FT ADLT (ELECTROSURGICAL) ×2 IMPLANT
GAUZE SPONGE 2X2 STRL 8-PLY (GAUZE/BANDAGES/DRESSINGS) ×2 IMPLANT
GLOVE BIO SURGEON STRL SZ 6.5 (GLOVE) ×2 IMPLANT
GLOVE BIOGEL PI IND STRL 7.5 (GLOVE) IMPLANT
GLOVE SURG SS PI 7.0 STRL IVOR (GLOVE) IMPLANT
GOWN STRL REUS W/ TWL LRG LVL3 (GOWN DISPOSABLE) ×4 IMPLANT
HEMOSTAT ARISTA ABSORB 3G PWDR (HEMOSTASIS) IMPLANT
HEMOSTAT SURGICEL .5X2 ABSORB (HEMOSTASIS) IMPLANT
HEMOSTAT SURGICEL 2X14 (HEMOSTASIS) IMPLANT
IMPL PROPEL CONTOUR (Prosthesis and Implant ENT) IMPLANT
INFLATOR BALLOON W/TUBE (BALLOONS) IMPLANT
IV 0.9% NACL 250 ML (IV SOLUTION) IMPLANT
IV NS 500ML BAXH (IV SOLUTION) ×2 IMPLANT
NDL PRECISIONGLIDE 27X1.5 (NEEDLE) ×2 IMPLANT
NDL SAFETY ECLIPSE 18X1.5 (NEEDLE) IMPLANT
NDL SPNL 25GX3.5 QUINCKE BL (NEEDLE) ×2 IMPLANT
PACK BASIN DAY SURGERY FS (CUSTOM PROCEDURE TRAY) ×2 IMPLANT
PACK ENT DAY SURGERY (CUSTOM PROCEDURE TRAY) ×2 IMPLANT
PATTIES SURGICAL .5 X3 (DISPOSABLE) ×2 IMPLANT
PENCIL SMOKE EVACUATOR (MISCELLANEOUS) IMPLANT
SHEATH ENDOSCRUB 0 DEG (SHEATH) IMPLANT
SHEATH ENDOSCRUB 30 DEG (SHEATH) IMPLANT
SHEATH ENDOSCRUB 45 DEG (SHEATH) IMPLANT
SHEET MEDIUM DRAPE 40X70 STRL (DRAPES) IMPLANT
SHEET SILICONE 2X3 0.03 REINF (MISCELLANEOUS) IMPLANT
SLEEVE SCD COMPRESS KNEE MED (STOCKING) ×2 IMPLANT
SOLN 0.9% NACL POUR BTL 1000ML (IV SOLUTION) ×2 IMPLANT
SOLUTION ANTFG W/FOAM PAD STRL (MISCELLANEOUS) ×2 IMPLANT
SPIKE FLUID TRANSFER (MISCELLANEOUS) IMPLANT
SPLINT NASAL AIRWAY SILICONE (MISCELLANEOUS) ×2 IMPLANT
SPLINT NASAL POSISEP X .6X2 (GAUZE/BANDAGES/DRESSINGS) IMPLANT
SPONGE NEURO XRAY DETECT 1X3 (DISPOSABLE) IMPLANT
SUT CHROMIC 2 0 SH (SUTURE) IMPLANT
SUT CHROMIC 4 0 RB 1X27 (SUTURE) ×2 IMPLANT
SUT ETHILON 6 0 P 1 (SUTURE) IMPLANT
SUT PLAIN 4 0 ~~LOC~~ 1 (SUTURE) ×2 IMPLANT
SUT PROLENE 2 0 SH DA (SUTURE) IMPLANT
SUT PROLENE 3 0 PS 1 (SUTURE) IMPLANT
SUT VICRYL RAPIDE 4/0 PS 2 (SUTURE) IMPLANT
SWAB COLLECTION DEVICE MRSA (MISCELLANEOUS) IMPLANT
SWAB CULTURE ESWAB REG 1ML (MISCELLANEOUS) IMPLANT
SYR 10ML LL (SYRINGE) ×2 IMPLANT
SYR 3ML 18GX1 1/2 (SYRINGE) ×4 IMPLANT
SYR TB 1ML LL NO SAFETY (SYRINGE) IMPLANT
TOWEL GREEN STERILE FF (TOWEL DISPOSABLE) ×2 IMPLANT
TRACKER ENT INSTRUMENT (MISCELLANEOUS) ×2 IMPLANT
TRACKER ENT PATIENT (MISCELLANEOUS) ×2 IMPLANT
TRAY DSU PREP LF (CUSTOM PROCEDURE TRAY) ×2 IMPLANT
TUBE CONNECTING 20X1/4 (TUBING) ×2 IMPLANT
TUBE SALEM SUMP 12FR 48 (TUBING) IMPLANT
TUBE SALEM SUMP 16F (TUBING) ×2 IMPLANT
TUBING STRAIGHTSHOT EPS 5PK (TUBING) IMPLANT
YANKAUER SUCT BULB TIP NO VENT (SUCTIONS) ×2 IMPLANT

## 2024-02-27 NOTE — H&P (Signed)
 Pre-Operative H&P - Day Of Surgery Patient Name: Eric [name] Date:   02/27/2024  HPI: Eric [name] is a 78 y.o. male who presents today for operative treatment of bilateral chronic sinusitis, bilateral inferior turbinate hypertrophy, nasal septal deviation. Patient denies recent significant changes to health or significant new medications or physiologic change in condition which would immediately impact plans. No new types of therapy has been initiated that would change the plan or the appropriateness of the plan.   ROS:  A complete review of systems was obtained and is otherwise negative.   PMH:  Past Medical History:  Diagnosis Date   Alcohol abuse    Allergy    Back pain    Cancer (HCC)    Basal cell cancer  below left eye, had Mohs'   Cataract    forming   Colon polyps    Diverticulosis of colon    DJD (degenerative joint disease)    Dysrhythmia    Palpations from caffiene- reports he was having PACs due to slim fast wth caffeine - had full cardiac w/u and was told to cut back on caffeine;  now drink decaf bevrerages and no rpeort of recurrence    Enlarged prostate    GERD (gastroesophageal reflux disease)    Hearing loss    has HAs,   Hip osteoarthritis    Left   History of hay fever    Hx of cardiovascular stress test    Lexiscan  Myoview (04/2013):  No ischemia, EF 61%; low risk   Hyperlipemia    Hypertension    Insulin  resistance    om metfromin    Internal hemorrhoids without mention of complication    Leg edema    Neuroma of foot    left   Obesity    Osteoarthrosis, unspecified whether generalized or localized, unspecified site    Palpitations    Pneumonia    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   Pre-diabetes    Primary localized osteoarthrosis of right shoulder 05/25/2016   Recent weight loss    75lbs   Seasonal allergies    Sleep apnea    CPAP   Thyroid  nodule    calcified thyroid  nodules- bx negative    Tremor of both hands      PSH:  Past Surgical History:  Procedure Laterality Date   COLONOSCOPY     INGUINAL HERNIA REPAIR Left 03/29/2023   Procedure: OPEN LEFT INGUINAL HERNIA REPAIR WITH MESH;  Surgeon: Kinsinger, Herlene Righter, MD;  Location: [facility];  Service: General;  Laterality: Left;   JOINT REPLACEMENT  2018   POLYPECTOMY     SHOULDER SURGERY  2009   right   TONSILLECTOMY     TOTAL HIP ARTHROPLASTY Left 03/28/2017   Procedure: TOTAL HIP ARTHROPLASTY ANTERIOR APPROACH;  Surgeon: Liam Lerner, MD;  Location: [facility];  Service: Orthopedics;  Laterality: Left;   TOTAL HIP ARTHROPLASTY Right 09/25/2018   Procedure: Right Anterior Hip Arthroplasty;  Surgeon: Liam Lerner, MD;  Location: [facility];  Service: Orthopedics;  Laterality: Right;   TOTAL KNEE ARTHROPLASTY Right 10/04/2022   Procedure: RIGHT TOTAL KNEE ARTHROPLASTY;  Surgeon: Liam Lerner, MD;  Location: [facility];  Service: Orthopedics;  Laterality: Right;   TOTAL SHOULDER ARTHROPLASTY Left 10/14/2015   Procedure: LEFT TOTAL SHOULDER ARTHROPLASTY;  Surgeon: Fonda Olmsted, MD;  Location: [facility];  Service: Orthopedics;  Laterality: Left;   TOTAL SHOULDER ARTHROPLASTY Right 05/25/2016   Procedure: TOTAL SHOULDER ARTHROPLASTY;  Surgeon: Fonda Olmsted, MD;  Location:  [facility];  Service: Orthopedics;  Laterality: Right;    MEDS:   Current Facility-Administered Medications:    lactated ringers  infusion, , Intravenous, Continuous, Erma Thom SAUNDERS, MD, Last Rate: 10 mL/hr at 02/27/24 1116, Continued from Pre-op at 02/27/24 1116  ALLERGIES: Lisinopril   EXAM: Vitals: BP 131/69   Pulse 65   Temp 98.5 F (36.9 C) (Oral)   Resp 16   Ht 5' 8 (1.727 m)   Wt 93.2 kg   SpO2 100%   BMI 31.24 kg/m   General Awake, at baseline alertness.   HEENT No scleral icterus or conjunctival hemorrhage. Globe position appears normal. External ears  normal. Nose patent without rhinorrhea. No lymphadenopathy. No thyromegaly  Cardiovascular No cyanosis.  Pulmonary No audible  stridor. Breathing easily with no labor.  Neuro Symmetric facial movement.   Psychiatry Appropriate affect and mood.  Skin No scars or lesions on face or neck.  Extermities Moves all extremities with normal range of motion.   Other Findings None.   Assessment & Plan: [name] has diagnoses of ilateral chronic sinusitis, bilateral inferior turbinate hypertrophy, nasal septal deviation and will go to the OR today for bilateral functional endoscopic sinus surgery, bilateral inferior turbinate reduction, possible septoplasty. Informed consent was obtained and available in EMR today. All questions have been answered, and risks/benefits/alternatives of procedure as noted in the consent were discussed in a quiet area. Questions were invited and answered. The patient expressed understanding, provided consent and wished to proceed despite risks.  [name] 02/27/2024 12:12 PM

## 2024-02-27 NOTE — Discharge Instructions (Addendum)
 Surgery Discharge Instructions (Dr. Tobie)  1. Call your doctor or go to the emergency room if you have: - Fever of 101.5 degrees or higher - Severe pain that has increased greatly since your surgery or is uncontrolled by your current pain medications - Increasing or concerning amount of bleeding from your nose. You should expect some bloody drainage from your nose for 1-3 days. Even a trickle nose bleed is expected after surgery - Nausea and vomiting that does not go away - Chest pain/shortness of breath - Any other acute events, problems, or concerns  2. Wound Care/Dressings/Drain Instructions:  - It is OK to shower following your surgery. - You have a small amount of dissolvable packing in your nose. The remainder of this may be removed in clinic, otherwise this does not need to come out. - You should begin using nasal saline irrigations (see below) once you no longer have bleeding from the nose. This is usually 1-2 days after your surgery.   3. Follow Up:  - A follow up appointment will be scheduled for you with Dr. Tobie. If you do not know the date/time, please contact our office  4. Activity/Restrictions: - Resume your regular activities, as tolerated - Avoid heavy lifting, bending over, manipulating your nose, blowing your nose, sneezing with your mouth closed, straining and strenuous activities until instructed otherwise. Do not lift more than 10 lbs for 10 days  5. Diet: - Resume your regular diet, as tolerated  6. Additional Instructions: - You should complete the course of antibiotic/steroid prescribed to you before surgery - Use acetaminophen /Tylenol  and ibuprofen to control your pain. If this does not bring you relief or the pain remains severe, a stronger pain medication (Oxycodone  - 5mg  tablet every 4-6 hours as needed) has been prescribed to you. - DO NOT MIX NARCOTIC PAIN MEDICATIONS OR TAKE NARCOTIC PRESCRIPTIONS AT THE SAME TIME (PERCODET, LORTAB, ROXICODONE ,  ETC.) - DO NOT DRIVE OR OPERATE HEAVY MACHINERY WHILE ON NARCOTICS - DO NOT TAKE MORE THAN 4 GRAMS (4000mg ) OF TYLENOL  (ACETAMINOPHEN ) IN 95 HOURS  ________________________________________________________________  Department of Otolaryngology Contact Info: Otolaryngology Nursing Triage (Monday-Friday daytime working hours or for emergencies after hours) [phone]  [facility] Nasal Saline Rinse  - start nasal saline rinses with NeilMed Bottle available over the counter daily or twice per day once bloody drainage stops    Nasal Saline Irrigation instructions: If you choose to make your own salt water  solution, You will need: Salt (kosher, canning, or pickling salt) Baking soda Nasal irrigation bottle (i.e. [facility] Sinus Rinse) Measuring spoon ( teaspoon) Distilled / boiled water    Mix solution Mix 1 teaspoon of salt, 1/2 teaspoon of baking soda and 1 cup of water  into irrigation bottle ** May use saline packet instead of homemade recipe for this step if you prefer If medicine was prescribed to be mixed with solution, place this into bottle Examples 2 inches of 2% mupirocin  ointment Budesonide solution Position your head: Lean over sink (about 45 degrees) Rotate head (about 45 degrees) so that one nostril is above the other Irrigate Insert tip of irrigation bottle into upper nostril so it forms a comfortable seal Irrigate while breathing through your mouth May remove the straw from the bottle in order to irrigate the entire solution (important if medicine was added) Exhale through nose when finished and blow nose as necessary  Repeat on opposite side with other 1/2 of solution (120 mL) or remake solution if all 240 mL was used  on first side Wash irrigation bottle regularly, replace every 3 months  No Tylenol  until 5:15pm today, if needed

## 2024-02-27 NOTE — Op Note (Signed)
 Otolaryngology Operative note  RAVIS HERNE Date/Time of Admission: 02/27/2024 10:24 AM  CSN: [id number];MRN:[id number]  DOB: 1945-03-23 Age: 78 y.o. Location:  [facility]    Pre-Op Diagnosis: Other Bilateral Chronic Sinusitis Bilateral Inferior Turbinate Hypertrophy  Post-Op Diagnosis: Same  Procedure: Bilateral Nasal Endoscopy with Total Ethmoidectomy with Bilateral Frontal Sinuosotmy with tissue removal from frontal sinus (CPT [phone]) Bilateral Nasal Endoscopy with Bilateral Maxillary Antrostomy with Removal of Tissue from Bilateral Maxillary Sinus (CPT 31267-50, 51) Bilateral Nasal Endoscopy with Bilateral Inferior Turbinate Submucous Reduction and Outfracture (CPT 31040-50, 51) Stereotactic Computer-Assisted Image Guidance [phone])  Surgeon: Eldora Blanch, MD  Anesthesia type:  General  Anesthesiologist: Anesthesiologist: Patrisha Bernardino SQUIBB, MD; Tilford Franky BIRCH, MD CRNA: Julieanne Fairy BROCKS, CRNA   Staff: Circulator: Elaine Avelina PARAS, RN Relief Circulator: Waddell Silvano HERO, RN Scrub Person: Alto Charmaine PARAS Vendor Representative : Sameul Cough  Implants: Implant Name Type Inv. Item Serial No. Manufacturer Lot No. LRB No. Used Action  IMPL PROPEL CONTOUR - [id number] Prosthesis and Implant ENT IMPL PROPEL CONTOUR  [employer] NEUROMOD PAIN MGMT [id number] Right 1 Explanted & Returned to [employer] CONTOUR - [id number] Prosthesis and Implant ENT IMPL PROPEL CONTOUR  [employer] NEUROMOD PAIN MGMT [id number] Right 1 Implanted  Posi Sep X packs - Bilateral  Specimens: ID Type Source Tests Collected by Time Destination  1 : Bilateral sinus contents Tissue PATH Sinus Contents/Nasal Polyps SURGICAL PATHOLOGY Blanch Eldora NOVAK, MD 02/27/2024 1453   A : Sinus cultures Tissue Path Tissue AEROBIC/ANAEROBIC CULTURE W GRAM STAIN (SURGICAL/DEEP WOUND) Blanch Eldora NOVAK, MD 02/27/2024 1320     EBL:  300 mL  Drains: None  Post-op disposition and  condition: PACU, hemodynamically stable   Findings: Bilateral maxillary and ethmoid sinus and right frontal sinus with purulence. Cultured. Sinuses widely patent at end of procedure.  Complications: None apparent  Indications and consent:  Eric [name] is a 78 y.o. male with diagnoses above. The patient's options were discussed, including risks/benefits/alternatives for each option. Patient expressed understanding, and despite these risks, consented and decided to proceed with above procedures. Informed consent was signed before proceeding.  Procedure: Anesthesia and Prep -- After being properly identified in the preoperative holding area, the patient was brought into the operating suite. He was placed supine on the operating table. A pre-procedural time-out was performed. Pre-operative antibiotics and steroids were administered. After induction of general anesthesia, the patient was successfully intubated with confirmation of tube placement via CO2 return. Eyes were taped closed. Pledgets soaked in 1:1000 fluorescein  dyed epinephrine  were placed in each nostril. The face was prepped and draped in usual fashion for sinus surgery.  Image Guidance Registration and Prep -- Image guidance was necessary given pathology abutting the lamina papyracea, and frontal sinus and skull base. The registration sticker for the Stereotactic image guidance system was placed on the forehead. The face was registered with good correlation. Landmarks were checked with the probe which showed satisfactory accuracy.   Bilateral nasal endoscopy with maxillary antrostomy with  bilateral removal of tissue -- 1% Lidocaine  with 1:100,000 epinephrine  was used to infiltrate the axilla of the middle turbinate, head of the middle turbinate and the sphenopalatine artery bilaterally. We began on the left. The middle turbinate was medialized using a freer. The maxillary seeker was inserted and slipped behind the uncinate process. This  was pulled anteriorly exposing the edge. A pediatric back-biter was inserted and the uncinate was cut anteriorly toward but not into the nasolacrimal duct. The microdebrider  was used to remove the uncinate process above and below the cut made by the back-biter. A curved suction was inserted into the maxillary sinus through the antrostomy/natural os and pushed posteriorly and inferiorly to enlarge the opening. The microdebrider was used to clean up the edges of bone and mucosa. A 30 and 45 degree endoscope was then attached and used to view the sinus. The natural os was identified and the maxillary antrostomy was complete.  The same procedure was repeated on the right side to complete the right maxillary antrostomy. Both sinuses had copious purulence which was cultured from the right, and mucous retention cysts. The sinues were copiously irrigated with normal saline until clear and the mucous retention cysts were removed using angled giraffe forceps..  Bilateral nasal endoscopy with total ethmoidectomy -- We again began on the left. The 0 degree endoscope was reattached and the ethmoid bulla was identified visually as well as confirmed via the image guidance system. The microdebrider was used to open the bulla working from medial and inferior to lateral and superior. Removal extended laterally near the lamina papyracea and superiorly toward skull base. Ethmoid cells were opened in succession using image guidance intermittently to verify location.  The basal lamella of the middle turbinate was identified and removed using the microdebrider overlying the expected position of the superior turbinate.  This was removed to visualize the posterior ethmoid cavity.  The posterior ethmoid cells were removed in like fashion using the image guidance system to meticulously locate ethmoid partitions. The microdebrider was used to remove thin bone and clean up mucosal edges. There was some notable polypoid mucosa as well as  small polyps in the anterior ethmoid cavity.  The ethmoid cavity was irrigated copiously with normal saline until clear. The same procedure was repeated on the right side to complete the right total ethmoidectomy with polypoid mucosa in right anterior ethmoid cavity.  Bilateral nasal endoscopy with frontal sinusotomy and frontal sinus exploration -- We began on the left. 30 and 45 degree scopes were used to identify landmarks of the frontal recess. The CT scan was consulted to identify the drainage pattern. A large agger nasi cell was already removed as part of the ethmoidectomy. The sinus was entered through the natural ostium using the navigated frontal seeker.  Any remaining bony partitions of the anterior ethmoid cavity/agger nasi were removed.  A curved mushroom punch and rad 60 microdebrider were used to widen the frontal ostium in the direction of the frontal beak. This significantly opened the frontal sinus outflow tract.  The sinus was copiously irrigated with normal saline. The same procedure was repeated on the right to complete the right frontal sinusotomy. There was purulence in the right frontal sinus which was irrigated until clear. Given more narrow FSOT and more inflammation, a propel contour stent was placed in the right frontal sinus outflow tract  Right nasal endoscopy with maxillary antrostomy -- 1% Lidocaine  with 1:100,000 epinephrine  was used to infiltrate the axilla of the middle turbinate, head of the middle turbinate and the sphenopalatine artery. The middle turbinate was medialized using a freer. The maxillary seeker was inserted and slipped behind the uncinate process. This was pulled anteriorly exposing the edge. A pediatric back-biter was inserted and the uncinate was cut anteriorly toward but not into the nasolacrimal duct. The microdebrider was used to remove the uncinate process above and below the cut made by the back-biter. A curved suction was inserted into the maxillary sinus  through the antrostomy/natural os  and pushed posteriorly and inferiorly to enlarge the opening. The microdebrider was used to clean up the edges of bone and mucosa. A 30 and 45 degree endoscope was then attached and used to view the sinus. The natural os was identified and the maxillary antrostomy was complete.  The sinus was copiously irrigated with normal saline.  Bilateral submucous inferior turbinate reduction with microdebrider and outfracture -- 1% lidocaine  with 1:100,000 units of epinephrine  was injected into the bilateral inferior turbinates. A 15 blade was used to make an incision in the head of the left inferior turbinate. A cottle elevator was then used to elevate a submucosal flap along the medial surface of the turbinate. A microdebrider turbinate blade was inserted into the left turbinate and pieces of bone and soft tissue of the turbinate were resected in a submucosal fashion. Particular attention was paid to the head of the turbinate to improve nasal valve airflow. Once adequate resection was achieved, a Engineering geologist was used to out-fracture the inferior turbinate. This procedure was then repeated on the right side to complete the bilateral submucosal inferior turbinate resection. The nasal airway was then seen to be more patent.  Conclusion -- Both sides were inspected and no orbital fat or evidence of cerebral spinal fluid leak were observed. Both sides were irrigated and clot removed. PosiSepX packs were inserted lateral to the middle turbinates bilaterally.  A flexible suction catheter was used to remove fluid and blood from the oropharynx and hypopharynx.   Tape was removed from the eyelids and the eyes were checked for tension or proptosis, none was observed. The image guidance sticker was removed. The patient's skin was cleaned. He was returned to the care of the anesthesia team. He was then weaned from the anesthetic and transported to the PACU in stable condition.  Charletha Dalpe B  [place]

## 2024-02-27 NOTE — Anesthesia Procedure Notes (Signed)
 Procedure Name: Intubation Date/Time: 02/27/2024 12:49 PM  Performed by: Julieanne Fairy BROCKS, CRNAPre-anesthesia Checklist: Patient identified, Emergency Drugs available, Suction available and Patient being monitored Patient Re-evaluated:Patient Re-evaluated prior to induction Oxygen Delivery Method: Circle system utilized Preoxygenation: Pre-oxygenation with 100% oxygen Induction Type: IV induction Ventilation: Mask ventilation without difficulty Laryngoscope Size: Mac and 4 Grade View: Grade III Tube type: Oral Tube size: 7.5 mm Number of attempts: 1 Airway Equipment and Method: Stylet and Oral airway Placement Confirmation: ETT inserted through vocal cords under direct vision, positive ETCO2 and breath sounds checked- equal and bilateral Secured at: 21 cm Tube secured with: Tape Dental Injury: Teeth and Oropharynx as per pre-operative assessment

## 2024-02-27 NOTE — Transfer of Care (Signed)
 Immediate Anesthesia Transfer of Care Note  Patient: Eric [name]  Procedure(s) Performed: BILATERAL FUNCTIONAL ENDOSCOPIC SINUS SURGERY USING COMPUTER-ASSISTED NAVIGATION (Bilateral: Nose) BILATERAL INFERIOR TURBINATE REDUCTION (Bilateral: Nose)  Patient Location: PACU  Anesthesia Type:General  Level of Consciousness: awake, alert , and oriented  Airway & Oxygen Therapy: Patient Spontanous Breathing and Patient connected to face mask oxygen  Post-op Assessment: Report given to RN and Post -op Vital signs reviewed and stable  Post vital signs: Reviewed and stable  Last Vitals:  Vitals Value Taken Time  BP 119/52 02/27/24 16:17  Temp 37.5 C 02/27/24 16:17  Pulse 78 02/27/24 16:17  Resp 17 02/27/24 16:17  SpO2 97 % 02/27/24 16:17  Vitals shown include unfiled device data.  Last Pain:  Vitals:   02/27/24 1104  TempSrc: Oral  PainSc: 0-No pain      Patients Stated Pain Goal: 4 (02/27/24 1104)  Complications: No notable events documented.

## 2024-02-27 NOTE — Anesthesia Preprocedure Evaluation (Addendum)
 Anesthesia Evaluation  Patient identified by MRN, date of birth, ID band Patient awake    Reviewed: Allergy & Precautions, NPO status , Patient's Chart, lab work & pertinent test results  Airway Mallampati: II  TM Distance: >3 FB Neck ROM: Full    Dental no notable dental hx.    Pulmonary sleep apnea and Continuous Positive Airway Pressure Ventilation    Pulmonary exam normal        Cardiovascular hypertension, Pt. on medications Normal cardiovascular exam+ dysrhythmias      Neuro/Psych  PSYCHIATRIC DISORDERS  Depression    Tremor    GI/Hepatic Neg liver ROS,GERD  Medicated and Controlled,,  Endo/Other  diabetes, Oral Hypoglycemic Agents  Patient on GLP-1 Agonist. Last dose 02/16/24  Renal/GU negative Renal ROS     Musculoskeletal   Abdominal  (+) + obese  Peds  Hematology negative hematology ROS (+)   Anesthesia Other Findings Chronic maxillary sinusitis  Chronic frontal sinusitis  Chronic ethmoidal sinusitis  Hypertrophy of nasal turbinates    Reproductive/Obstetrics                              Anesthesia Physical Anesthesia Plan  ASA: 3  Anesthesia Plan: General   Post-op Pain Management:    Induction: Intravenous  PONV Risk Score and Plan: 2 and Ondansetron , Dexamethasone  and Treatment may vary due to age or medical condition  Airway Management Planned: Oral ETT  Additional Equipment:   Intra-op Plan:   Post-operative Plan: Extubation in OR  Informed Consent: I have reviewed the patients History and Physical, chart, labs and discussed the procedure including the risks, benefits and alternatives for the proposed anesthesia with the patient or authorized representative who has indicated his/her understanding and acceptance.     Dental advisory given  Plan Discussed with: CRNA  Anesthesia Plan Comments:          Anesthesia Quick Evaluation

## 2024-02-28 ENCOUNTER — Ambulatory Visit: Admitting: Family Medicine

## 2024-02-28 ENCOUNTER — Ambulatory Visit

## 2024-02-28 ENCOUNTER — Encounter (HOSPITAL_BASED_OUTPATIENT_CLINIC_OR_DEPARTMENT_OTHER): Payer: Self-pay | Admitting: Otolaryngology

## 2024-02-28 NOTE — Telephone Encounter (Signed)
 Just checking to see if you have done this yet. If not, please do.

## 2024-02-28 NOTE — Anesthesia Postprocedure Evaluation (Signed)
 Anesthesia Post Note  Patient: KAMORI BARBIER  Procedure(s) Performed: BILATERAL FUNCTIONAL ENDOSCOPIC SINUS SURGERY USING COMPUTER-ASSISTED NAVIGATION (Bilateral: Nose) BILATERAL INFERIOR TURBINATE REDUCTION (Bilateral: Nose)     Patient location during evaluation: PACU Anesthesia Type: General Level of consciousness: awake and alert Pain management: pain level controlled Vital Signs Assessment: post-procedure vital signs reviewed and stable Respiratory status: spontaneous breathing, nonlabored ventilation, respiratory function stable and patient connected to nasal cannula oxygen Cardiovascular status: blood pressure returned to baseline and stable Postop Assessment: no apparent nausea or vomiting Anesthetic complications: no   No notable events documented.  Last Vitals:  Vitals:   02/27/24 1652 02/27/24 1741  BP: 108/67 108/67  Pulse:  88  Resp: 15 16  Temp: 36.6 C 36.6 C  SpO2: 96% 94%    Last Pain:  Vitals:   02/27/24 1741  TempSrc: Temporal  PainSc:                  Franky JONETTA Bald

## 2024-02-28 NOTE — Telephone Encounter (Signed)
 Last patient note routed to Physical Therapy.  [phone]

## 2024-03-01 ENCOUNTER — Encounter: Payer: Self-pay | Admitting: Family Medicine

## 2024-03-01 ENCOUNTER — Ambulatory Visit: Admitting: Family Medicine

## 2024-03-01 VITALS — BP 106/78 | HR 50 | Temp 98.0°F | Resp 18 | Ht 68.0 in | Wt 208.4 lb

## 2024-03-01 DIAGNOSIS — Z794 Long term (current) use of insulin: Secondary | ICD-10-CM | POA: Diagnosis not present

## 2024-03-01 DIAGNOSIS — E559 Vitamin D deficiency, unspecified: Secondary | ICD-10-CM | POA: Diagnosis not present

## 2024-03-01 DIAGNOSIS — Z6831 Body mass index (BMI) 31.0-31.9, adult: Secondary | ICD-10-CM | POA: Diagnosis not present

## 2024-03-01 DIAGNOSIS — E1169 Type 2 diabetes mellitus with other specified complication: Secondary | ICD-10-CM

## 2024-03-01 DIAGNOSIS — E785 Hyperlipidemia, unspecified: Secondary | ICD-10-CM | POA: Diagnosis not present

## 2024-03-01 DIAGNOSIS — E538 Deficiency of other specified B group vitamins: Secondary | ICD-10-CM | POA: Diagnosis not present

## 2024-03-01 DIAGNOSIS — I10 Essential (primary) hypertension: Secondary | ICD-10-CM

## 2024-03-01 LAB — SURGICAL PATHOLOGY

## 2024-03-01 MED ORDER — VALSARTAN 40 MG PO TABS: 40.0000 mg | ORAL_TABLET | Freq: Every day | ORAL | 3 refills | Status: DC

## 2024-03-01 NOTE — Assessment & Plan Note (Signed)
Per healthy weight and wellness 

## 2024-03-01 NOTE — Assessment & Plan Note (Signed)
 Check labs  Hgba1c to be checked  minimize simple carbs. Increase exercise as tolerated. Continue current meds

## 2024-03-01 NOTE — Assessment & Plan Note (Signed)
 Running low today Dec diovan  40 mg

## 2024-03-01 NOTE — Patient Instructions (Signed)

## 2024-03-01 NOTE — Assessment & Plan Note (Signed)
 Tolerating statin, encouraged heart healthy diet, avoid trans fats, minimize simple carbs and saturated fats. Increase exercise as tolerated

## 2024-03-01 NOTE — Progress Notes (Signed)
 Subjective:    Patient ID: Eric [name], male    DOB: [birth year], 78 y.o.   MRN: [id number]  Chief Complaint  Patient presents with   Hypertension   Hyperlipidemia   Follow-up    HPI Patient is in today for f/u bp, chol.  Discussed the use of AI scribe software for clinical note transcription with the patient, who gave verbal consent to proceed.  History of Present Illness Eric [name] is a 78 year old male who presents for a follow-up after sinus surgery.  He recently underwent sinus surgery, which involved cracking the bone around the sinuses to prevent resealing. Post-operatively, he has experienced significant epistaxis, which is still ongoing, and he is wearing a mask to manage it. He is unable to blow his nose or use his CPAP machine, resulting in poor sleep quality. He is currently taking prednisone  and Augmentin . His sense of smell has not returned.  His blood pressure has been checked multiple times and was noted to be low on one occasion, below 100 mmHg, at a recent visit to another doctor's office. He is currently on 80 mg of valsartan , which was increased from 40 mg due to previously high readings. He has lost approximately 90 pounds, which he attributes to Byetta use, and does not report symptoms of hypotension such as dizziness. He monitors his blood pressure at home and believes the low reading was an anomaly.  He has experienced significant weight loss and muscle mass reduction, particularly in his legs, which he attributes to Byetta and insufficient protein intake. He reports decreased energy levels and weaker legs. A testosterone level check showed it to be at the low end of normal, consistent with previous results.  He reports a history of bradycardia and has previously seen a urologist for prostate issues. He mentions a past urologist visit where he was advised to return if symptoms worsened.    Past Medical History:  Diagnosis Date   Alcohol abuse     Allergy    Back pain    Cancer (HCC)    Basal cell cancer  below left eye, had Mohs'   Cataract    forming   Colon polyps    Diverticulosis of colon    DJD (degenerative joint disease)    Dysrhythmia    Palpations from caffiene- reports he was having PACs due to slim fast wth caffeine - had full cardiac w/u and was told to cut back on caffeine;  now drink decaf bevrerages and no rpeort of recurrence    Enlarged prostate    GERD (gastroesophageal reflux disease)    Hearing loss    has HAs,   Hip osteoarthritis    Left   History of hay fever    Hx of cardiovascular stress test    Lexiscan  Myoview (04/2013):  No ischemia, EF 61%; low risk   Hyperlipemia    Hypertension    Insulin  resistance    om metfromin    Internal hemorrhoids without mention of complication    Leg edema    Neuroma of foot    left   Obesity    Osteoarthrosis, unspecified whether generalized or localized, unspecified site    Palpitations    Pneumonia    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   Pre-diabetes    Primary localized osteoarthrosis of right shoulder 05/25/2016   Recent weight loss    75lbs   Seasonal allergies    Sleep apnea    CPAP  Thyroid  nodule    calcified thyroid  nodules- bx negative    Tremor of both hands     Past Surgical History:  Procedure Laterality Date   COLONOSCOPY     INGUINAL HERNIA REPAIR Left 03/29/2023   Procedure: OPEN LEFT INGUINAL HERNIA REPAIR WITH MESH;  Surgeon: Kinsinger, Herlene Righter, MD;  Location: [facility];  Service: General;  Laterality: Left;   JOINT REPLACEMENT  03/05/17   POLYPECTOMY     SHOULDER SURGERY  03-05-2008   right   SINUS ENDO W/FUSION Bilateral 02/27/2024   Procedure: BILATERAL FUNCTIONAL ENDOSCOPIC SINUS SURGERY USING COMPUTER-ASSISTED NAVIGATION;  Surgeon: Tobie Eldora NOVAK, MD;  Location: [facility];  Service: ENT;  Laterality: Bilateral;   TONSILLECTOMY     TOTAL HIP ARTHROPLASTY Left 03/28/2017   Procedure: TOTAL HIP  ARTHROPLASTY ANTERIOR APPROACH;  Surgeon: Liam Lerner, MD;  Location: [facility];  Service: Orthopedics;  Laterality: Left;   TOTAL HIP ARTHROPLASTY Right 09/25/2018   Procedure: Right Anterior Hip Arthroplasty;  Surgeon: Liam Lerner, MD;  Location: [facility];  Service: Orthopedics;  Laterality: Right;   TOTAL KNEE ARTHROPLASTY Right 10/04/2022   Procedure: RIGHT TOTAL KNEE ARTHROPLASTY;  Surgeon: Liam Lerner, MD;  Location: [facility];  Service: Orthopedics;  Laterality: Right;   TOTAL SHOULDER ARTHROPLASTY Left 10/14/2015   Procedure: LEFT TOTAL SHOULDER ARTHROPLASTY;  Surgeon: Fonda Olmsted, MD;  Location: [facility];  Service: Orthopedics;  Laterality: Left;   TOTAL SHOULDER ARTHROPLASTY Right 05/25/2016   Procedure: TOTAL SHOULDER ARTHROPLASTY;  Surgeon: Fonda Olmsted, MD;  Location: [facility];  Service: Orthopedics;  Laterality: Right;   TURBINATE REDUCTION Bilateral 02/27/2024   Procedure: BILATERAL INFERIOR TURBINATE REDUCTION;  Surgeon: Tobie Eldora NOVAK, MD;  Location: [facility];  Service: ENT;  Laterality: Bilateral;    Family History  Problem Relation Age of Onset   Arthritis Mother    Cancer Mother 63       breast   Hyperlipidemia Mother    Colon polyps Mother 74   Hypertension Mother    Heart disease Mother    Depression Mother    Anxiety disorder Mother    Obesity Mother    Breast cancer Mother    Heart disease Father        CAD--passed away 05-Mar-2012 age 21   Hyperlipidemia Father    Hypertension Father    Obesity Father    Diabetes Father    Depression Father    Sleep apnea Father    Cancer Sister 26       breast   Breast cancer Sister    Sleep apnea Sister    Cancer Brother 52       prostate   Prostate cancer Brother    Atrial fibrillation Brother    Tremor Paternal Grandfather    Breast cancer Other    Prostate cancer Other    Colon cancer Neg Hx    Esophageal cancer Neg Hx    Rectal cancer Neg Hx    Stomach cancer Neg Hx     Social History    Socioeconomic History   Marital status: Married    Spouse name: Delan Ksiazek    Number of children: 3   Years of education: Not on file   Highest education level: Professional school degree (e.g., MD, DDS, DVM, JD)  Occupational History   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    Social Drivers of Health   Tobacco Use: Low Risk (03/01/2024)   Patient History    Smoking Tobacco Use: Never    Smokeless Tobacco Use: Never    Passive Exposure: Not on file  Financial Resource Strain: Low Risk (02/28/2024)   Overall Financial Resource Strain (CARDIA)    Difficulty of Paying Living Expenses: Not hard at all  Food Insecurity: No Food Insecurity (02/28/2024)   Epic    Worried About Programme Researcher, Broadcasting/film/video in the Last Year: Never true    Ran Out of Food in the Last Year: Never true  Transportation Needs: No Transportation Needs (02/28/2024)   Epic    Lack of Transportation (Medical): No    Lack of Transportation (Non-Medical): No  Physical Activity: Insufficiently Active (02/28/2024)   Exercise Vital Sign    Days of Exercise per Week: 2 days    Minutes of Exercise per Session: 20 min  Stress: No Stress Concern Present (02/28/2024)   [name] of Occupational Health - Occupational Stress Questionnaire    Feeling of Stress: Not at all  Social Connections: Moderately Integrated (02/28/2024)   Social Connection and Isolation Panel    Frequency of Communication with Friends and Family: More than three times a week    Frequency of Social Gatherings  with Friends and Family: Once a week    Attends Religious Services: More than 4 times per year    Active Member of [employer] or Organizations: No    Attends Banker Meetings: Not on file    Marital Status: Married  Intimate Partner Violence: Not At Risk (09/08/2023)   Epic    Fear of Current or Ex-Partner: No    Emotionally Abused: No    Physically Abused: No    Sexually Abused: No  Depression (PHQ2-9): Low Risk (09/08/2023)   Depression (PHQ2-9)    PHQ-2 Score: 0  Alcohol Screen: Low Risk (02/28/2024)   Alcohol Screen    Last Alcohol Screening Score (AUDIT): 1  Housing: Low Risk (02/28/2024)   Epic    Unable to Pay for Housing in the Last Year: No    Number of Times Moved in the Last Year: 0    Homeless in the Last Year: No  Utilities: Not At Risk (09/08/2023)   Epic    Threatened with loss of utilities: No  Health Literacy: Adequate Health Literacy (09/08/2023)   B1300 Health Literacy    Frequency of need for help with medical instructions: Never    Outpatient Medications Prior to Visit  Medication Sig Dispense Refill   acetaminophen  (TYLENOL ) 500 MG tablet Take 2 tablets (1,000 mg total) by mouth every 6 (six) hours as needed. 100 tablet 2   amoxicillin -clavulanate (AUGMENTIN ) 875-125 MG tablet Take 1 tablet by mouth 2 (two) times daily for 12 days. 24 tablet 0   [START ON 03/05/2024] aspirin  EC 81 MG tablet Take 1 tablet (81 mg total) by mouth at bedtime.     atorvastatin  (LIPITOR) 40 MG tablet Take 1 tablet (40 mg total) by mouth daily. 90 tablet 1  azelastine  (ASTELIN ) 0.1 % nasal spray Place 1 spray into both nostrils 2 (two) times daily. Use in each nostril as directed 30 mL 12   calcium  carbonate (TUMS - DOSED IN MG ELEMENTAL CALCIUM ) 500 MG chewable tablet Chew 1,000 mg by mouth daily as needed for indigestion or heartburn.     clindamycin  (CLEOCIN -T) 1 % lotion Apply topically 2 (two) times daily. (Patient taking differently: Apply 1 Application topically 2 (two)  times daily as needed (infected hair follicles).) 60 mL 2   Coenzyme Q10 (COQ10) 200 MG CAPS Take 200 mg by mouth at bedtime.     Cyanocobalamin  (B-12) 1000 MCG CAPS Take 1 tablet by mouth daily.     famotidine (PEPCID) 20 MG tablet Take 20 mg by mouth at bedtime.     fluocinonide  ointment (LIDEX ) 0.05 % Apply 1 application topically daily as needed (for bites). 30 g 3   fluticasone  (FLONASE ) 50 MCG/ACT nasal spray Place 2 sprays into both nostrils daily as needed for allergies.      Ketotifen Fumarate (ALLERGY EYE DROPS OP) Place 1 drop into both eyes daily as needed (allergies).     Magnesium  250 MG TABS Take 250 mg by mouth daily.     metFORMIN  (GLUCOPHAGE ) 500 MG tablet Take 1 tablet (500 mg total) by mouth daily with breakfast. 90 tablet 0   montelukast  (SINGULAIR ) 10 MG tablet Take 1 tablet (10 mg total) by mouth at bedtime. 30 tablet 3   oxyCODONE  (ROXICODONE ) 5 MG immediate release tablet Take 1 tablet (5 mg total) by mouth every 8 (eight) hours as needed for up to 7 days. 10 tablet 0   pantoprazole  (PROTONIX ) 40 MG tablet Take 1 tablet (40 mg total) by mouth daily. 90 tablet 1   predniSONE  (DELTASONE ) 20 MG tablet Take 1 tablet (20 mg total) by mouth daily with breakfast for 12 days. 12 tablet 0   sodium chloride  (OCEAN) 0.65 % SOLN nasal spray Place 2 sprays into both nostrils as needed for up to 7 days for congestion. 30 mL 2   tamsulosin  (FLOMAX ) 0.4 MG CAPS capsule Take 1 capsule (0.4 mg total) by mouth daily after supper. 90 capsule 1   tirzepatide  (MOUNJARO ) 10 MG/0.5ML Pen Inject 10 mg into the skin once a week. 6 mL 0   Vitamin D , Ergocalciferol , (DRISDOL ) 1.25 MG (50000 UNIT) CAPS capsule TAKE ONE CAPSULE BY MOUTH EVERY 7 DAYS 12 capsule 0   valsartan  (DIOVAN ) 80 MG tablet Take 1 tablet (80 mg total) by mouth daily. 90 tablet 1   No facility-administered medications prior to visit.    Allergies[1]  Review of Systems  Constitutional:  Positive for malaise/fatigue. Negative  for fever.  HENT:  Negative for congestion.   Eyes:  Negative for blurred vision.  Respiratory:  Negative for shortness of breath.   Cardiovascular:  Negative for chest pain, palpitations and leg swelling.  Gastrointestinal:  Negative for abdominal pain, blood in stool and nausea.  Genitourinary:  Negative for dysuria and frequency.  Musculoskeletal:  Negative for falls.  Skin:  Negative for rash.  Neurological:  Negative for dizziness, loss of consciousness and headaches.  Endo/Heme/Allergies:  Negative for environmental allergies.  Psychiatric/Behavioral:  Negative for depression. The patient is not nervous/anxious.        Objective:    Physical Exam Vitals and nursing note reviewed.  Constitutional:      General: He is not in acute distress.    Appearance: Normal appearance. He is well-developed.  HENT:  Head: Normocephalic and atraumatic.     Nose:     Comments: Just had sinus surgery Eyes:     General: No scleral icterus.       Right eye: No discharge.        Left eye: No discharge.  Cardiovascular:     Rate and Rhythm: Normal rate and regular rhythm.     Heart sounds: No murmur heard. Pulmonary:     Effort: Pulmonary effort is normal. No respiratory distress.     Breath sounds: Normal breath sounds.  Musculoskeletal:        General: Normal range of motion.     Cervical back: Normal range of motion and neck supple.     Right lower leg: No edema.     Left lower leg: No edema.  Skin:    General: Skin is warm and dry.  Neurological:     Mental Status: He is alert and oriented to person, place, and time.  Psychiatric:        Mood and Affect: Mood normal.        Behavior: Behavior normal.        Thought Content: Thought content normal.        Judgment: Judgment normal.     BP 106/78 (BP Location: Left Arm, Patient Position: Sitting, Cuff Size: Large)   Pulse (!) 50   Temp 98 F (36.7 C) (Oral)   Resp 18   Ht 5' 8 (1.727 m)   Wt 208 lb 6.4 oz (94.5 kg)    SpO2 98%   BMI 31.69 kg/m  Wt Readings from Last 3 Encounters:  03/01/24 208 lb 6.4 oz (94.5 kg)  02/27/24 205 lb 7.5 oz (93.2 kg)  02/23/24 198 lb (89.8 kg)    Diabetic Foot Exam - Simple   No data filed    Lab Results  Component Value Date   WBC 6.1 03/01/2023   HGB 15.1 03/01/2023   HCT 46.3 03/01/2023   PLT 191 03/01/2023   GLUCOSE 82 02/23/2024   CHOL 110 02/23/2024   TRIG 50 02/23/2024   HDL 51 02/23/2024   LDLCALC 47 02/23/2024   ALT 27 02/23/2024   AST 17 02/23/2024   NA 142 02/23/2024   K 4.2 02/23/2024   CL 102 02/23/2024   CREATININE 0.92 02/23/2024   BUN 19 02/23/2024   CO2 22 02/23/2024   TSH 0.668 02/23/2024   PSA 1.31 03/05/2022   INR 1.0 09/21/2018   HGBA1C 5.2 02/23/2024    Lab Results  Component Value Date   TSH 0.668 02/23/2024   Lab Results  Component Value Date   WBC 6.1 03/01/2023   HGB 15.1 03/01/2023   HCT 46.3 03/01/2023   MCV 95 03/01/2023   PLT 191 03/01/2023   Lab Results  Component Value Date   NA 142 02/23/2024   K 4.2 02/23/2024   CO2 22 02/23/2024   GLUCOSE 82 02/23/2024   BUN 19 02/23/2024   CREATININE 0.92 02/23/2024   BILITOT 1.0 02/23/2024   ALKPHOS 78 02/23/2024   AST 17 02/23/2024   ALT 27 02/23/2024   PROT 6.3 02/23/2024   ALBUMIN 4.4 02/23/2024   CALCIUM  9.5 02/23/2024   ANIONGAP 9 02/20/2024   EGFR 86 02/23/2024   GFR 78.82 09/09/2022   Lab Results  Component Value Date   CHOL 110 02/23/2024   Lab Results  Component Value Date   HDL 51 02/23/2024   Lab Results  Component Value Date   LDLCALC  47 02/23/2024   Lab Results  Component Value Date   TRIG 50 02/23/2024   Lab Results  Component Value Date   CHOLHDL 3 09/09/2022   Lab Results  Component Value Date   HGBA1C 5.2 02/23/2024       Assessment & Plan:  Hyperlipidemia, unspecified hyperlipidemia type Assessment & Plan: Tolerating statin, encouraged heart healthy diet, avoid trans fats, minimize simple carbs and saturated fats.  Increase exercise as tolerated    Type 2 diabetes mellitus with other specified complication, with long-term current use of insulin  [facility]) Assessment & Plan: Check labs  Hgba1c to be checked  minimize simple carbs. Increase exercise as tolerated. Continue current meds    Vitamin D  deficiency  B12 deficiency  Morbid obesity (HCC)  Essential hypertension Assessment & Plan: Running low today Dec diovan  40 mg   Orders: -     Valsartan ; Take 1 tablet (40 mg total) by mouth daily.  Dispense: 90 tablet; Refill: 3  BMI 31.0-31.9,adult Assessment & Plan: Per healthy weight and wellness   Assessment and Plan Assessment & Plan Postoperative management of chronic sinusitis   He is postoperative following sinus surgery, experiencing ongoing epistaxis and anosmia. Persistent bleeding is due to the surgical technique involving bone cracking to prevent resealing. Anosmia may be permanent due to prolonged inflammation. Continue prednisone  and Augmentin  as prescribed. Follow up with ENT on Monday for postoperative evaluation.  Essential hypertension   Blood pressure readings have been low, with recent measurements below 100 mmHg. He is currently on 80 mg of valsartan  and reports no symptoms of hypotension. A recent weight loss of 90 pounds may contribute to lower blood pressure. Reduce valsartan  to 40 mg daily.  Type 2 diabetes mellitus   A1c is well-controlled at 5.2%.  Morbid obesity   He has achieved significant weight loss of 90 pounds. Muscle mass loss is noted, particularly in the legs, possibly due to weight loss medication (Byjero) and inadequate protein intake. He reports decreased energy levels. Continue physical therapy to strengthen legs and ensure adequate protein intake to prevent further muscle loss.  Hyperlipidemia   Cholesterol levels are well-controlled.    Charley Miske R Lowne Chase, DO     [1]  Allergies Allergen Reactions   Lisinopril  Cough

## 2024-03-04 LAB — AEROBIC/ANAEROBIC CULTURE W GRAM STAIN (SURGICAL/DEEP WOUND)

## 2024-03-05 ENCOUNTER — Encounter (INDEPENDENT_AMBULATORY_CARE_PROVIDER_SITE_OTHER): Payer: Self-pay | Admitting: Otolaryngology

## 2024-03-05 ENCOUNTER — Ambulatory Visit (INDEPENDENT_AMBULATORY_CARE_PROVIDER_SITE_OTHER): Payer: Self-pay | Admitting: Otolaryngology

## 2024-03-05 ENCOUNTER — Telehealth (INDEPENDENT_AMBULATORY_CARE_PROVIDER_SITE_OTHER): Payer: Self-pay

## 2024-03-05 ENCOUNTER — Ambulatory Visit (INDEPENDENT_AMBULATORY_CARE_PROVIDER_SITE_OTHER): Admitting: Otolaryngology

## 2024-03-05 VITALS — BP 108/69 | HR 71 | Ht 68.0 in | Wt 208.0 lb

## 2024-03-05 DIAGNOSIS — J343 Hypertrophy of nasal turbinates: Secondary | ICD-10-CM

## 2024-03-05 DIAGNOSIS — Z9889 Other specified postprocedural states: Secondary | ICD-10-CM | POA: Diagnosis not present

## 2024-03-05 DIAGNOSIS — J328 Other chronic sinusitis: Secondary | ICD-10-CM | POA: Diagnosis not present

## 2024-03-05 MED ORDER — CIPROFLOXACIN HCL 500 MG PO TABS: 500.0000 mg | ORAL_TABLET | Freq: Two times a day (BID) | ORAL | 0 refills | Status: AC

## 2024-03-05 MED ORDER — PREDNISOLONE ACETATE 1 % OP SUSP: OPHTHALMIC | 5 refills | Status: AC

## 2024-03-05 NOTE — Patient Instructions (Signed)
 Take cipro  500mg  twice daily for 7 days Use pred forte  drops 6 drops in each rinse bottle and rinse twice per day Use flonase  two sprays each nostril twice per day

## 2024-03-05 NOTE — Telephone Encounter (Signed)
 Kegan at pharmacy left message that they were not sure about SIG for patient Rx sent in. I called and spoke with Kegan and gave SIG from chart, which he stated they had figured it out.

## 2024-03-09 NOTE — Progress Notes (Signed)
 Dear Dr. Antonio Meth, Here is my assessment for our mutual patient, Eric [name]. Thank you for allowing me the opportunity to care for your patient. Please do not hesitate to contact me should you have any other questions. Sincerely, Dr. Eldora Blanch  Otolaryngology Clinic Note  HISTORY: Eric [name] is a 78 y.o. male kindly referred by Dr. Antonio Meth for evaluation of chronic sinusitis  Initial visit (11/2023):  He reports that he had a cold in January 2025, and he recovered from this but then had persistent bilateral discolored drainage and facial pressure bilaterally with foul smelling drainage. He reports that he did have dental pain on the right maxillary tooth for which he had dental procedure but did not resolve his symptoms. He has been on antibiotics (multiple rounds) with Dr. Antonio Meth prescribing him augmentin  and prednisone  as well as claritin , flonase , and astelin . He got some better but his symptoms continue to persist and continues to have foul smelling drainage, nasal congestion and bilateral facial pressure.  He also had a CT done and was referred here. Prior to this, he denies history of frequent sinus infections.   Improvement occurred with antibiotics, and nasal sprays.Allergy testing has not been done.  Denies typical AR symptoms. No previous sinonasal surgery.  Prior has had RAST which showed ragweed, goldenrod, BAHA grass, cats.  He is currently using flonase , astelin , and PO anthistamine and saline spray. Was using rinses prior.  He is a retired PA  --------------------------------------------------------- 01/2024 The patient gave consent to have this visit done by telemedicine / virtual visit, two identifiers were used to identify patient. This is also consent for access the chart and treat the patient via this visit. The patient is located in [place]  I, the provider, am at the office.  We spent 10 minutes together for the visit.  Joined by  telephone  Discussed upcoming procedure. I answered his questions and discussed details about procedure. He has stopped his ASA and Mounjaro . Discussed updated CT --------------------------------------------------------- 03/05/2024 Patient reports some expected bleeding. No fevers. Finishing perioperative abx/steroid. Otherwise no significant issues.  GLP-1: Mounjaro  AP/AC: ASA 81  Tobacco: no; EtOH: prior  PMHx: HLD, HTN, DM, Multiple Joint Replacement, OSA on CPAP, Palpitations  RADIOGRAPHIC EVALUATION AND INDEPENDENT REVIEW OF OTHER RECORDS: Dr. Antonio Meth (08/29/2023): nasal congestion and yellow nasal drainage for 4 months primarily on right. Allergy testing done - ragweed, goldenrod, cats; occassional afrin, on claritin , flonase , astelin . Dx: Sinusitis;Rx: CT and ref to ENT CT Face 08/29/2023 independently interpreted: bilateral OMC pattern sinusitis with septum deviates right and bilateral inferior turbinate hypertrophy; noted maxillary molars partially into maxillary sinus b/l CBC w/diff: WBC 6.1, Eos 0 CT [facility] 12/14/2023: agree with read, although does not appear to be polyps on right. No nasal mass on left. B/l OMC pattern opacification Cx 02/27/2024: Pan-sensitive Klebsiella Past Medical History:  Diagnosis Date   Alcohol abuse    Allergy    Back pain    Cancer (HCC)    Basal cell cancer  below left eye, had Mohs'   Cataract    forming   Colon polyps    Diverticulosis of colon    DJD (degenerative joint disease)    Dysrhythmia    Palpations from caffiene- reports he was having PACs due to slim fast wth caffeine - had full cardiac w/u and was told to cut back on caffeine;  now drink decaf bevrerages and no rpeort of recurrence    Enlarged prostate    GERD (gastroesophageal  reflux disease)    Hearing loss    has HAs,   Hip osteoarthritis    Left   History of hay fever    Hx of cardiovascular stress test    Lexiscan  Myoview (04/2013):  No ischemia, EF 61%; low risk    Hyperlipemia    Hypertension    Insulin  resistance    om metfromin    Internal hemorrhoids without mention of complication    Leg edema    Neuroma of foot    left   Obesity    Osteoarthrosis, unspecified whether generalized or localized, unspecified site    Palpitations    Pneumonia    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   Pre-diabetes    Primary localized osteoarthrosis of right shoulder 05/25/2016   Recent weight loss    75lbs   Seasonal allergies    Sleep apnea    CPAP   Thyroid  nodule    calcified thyroid  nodules- bx negative    Tremor of both hands    Past Surgical History:  Procedure Laterality Date   COLONOSCOPY     INGUINAL HERNIA REPAIR Left 03/29/2023   Procedure: OPEN LEFT INGUINAL HERNIA REPAIR WITH MESH;  Surgeon: Kinsinger, Herlene Righter, MD;  Location: [facility];  Service: General;  Laterality: Left;   JOINT REPLACEMENT  2017/03/30   POLYPECTOMY     SHOULDER SURGERY  03-30-2008   right   SINUS ENDO W/FUSION Bilateral 02/27/2024   Procedure: BILATERAL FUNCTIONAL ENDOSCOPIC SINUS SURGERY USING COMPUTER-ASSISTED NAVIGATION;  Surgeon: Tobie Eldora NOVAK, MD;  Location: [facility];  Service: ENT;  Laterality: Bilateral;   TONSILLECTOMY     TOTAL HIP ARTHROPLASTY Left 03/28/2017   Procedure: TOTAL HIP ARTHROPLASTY ANTERIOR APPROACH;  Surgeon: Liam Lerner, MD;  Location: [facility];  Service: Orthopedics;  Laterality: Left;   TOTAL HIP ARTHROPLASTY Right 09/25/2018   Procedure: Right Anterior Hip Arthroplasty;  Surgeon: Liam Lerner, MD;  Location: [facility];  Service: Orthopedics;  Laterality: Right;   TOTAL KNEE ARTHROPLASTY Right 10/04/2022   Procedure: RIGHT TOTAL KNEE ARTHROPLASTY;  Surgeon: Liam Lerner, MD;  Location: [facility];  Service: Orthopedics;  Laterality: Right;   TOTAL SHOULDER ARTHROPLASTY Left 10/14/2015   Procedure: LEFT TOTAL SHOULDER ARTHROPLASTY;  Surgeon: Fonda Olmsted, MD;  Location: [facility];  Service: Orthopedics;  Laterality: Left;   TOTAL  SHOULDER ARTHROPLASTY Right 05/25/2016   Procedure: TOTAL SHOULDER ARTHROPLASTY;  Surgeon: Fonda Olmsted, MD;  Location: [facility];  Service: Orthopedics;  Laterality: Right;   TURBINATE REDUCTION Bilateral 02/27/2024   Procedure: BILATERAL INFERIOR TURBINATE REDUCTION;  Surgeon: Tobie Eldora NOVAK, MD;  Location: [facility];  Service: ENT;  Laterality: Bilateral;   Family History  Problem Relation Age of Onset   Arthritis Mother    Cancer Mother 64       breast   Hyperlipidemia Mother    Colon polyps Mother 15   Hypertension Mother    Heart disease Mother    Depression Mother    Anxiety disorder Mother    Obesity Mother    Breast cancer Mother    Heart disease Father        CAD--passed away 03-30-2012 age 20   Hyperlipidemia Father    Hypertension Father    Obesity Father    Diabetes Father    Depression Father    Sleep apnea Father    Cancer Sister 26       breast   Breast cancer Sister    Sleep  apnea Sister    Cancer Brother 67       prostate   Prostate cancer Brother    Atrial fibrillation Brother    Tremor Paternal Grandfather    Breast cancer Other    Prostate cancer Other    Colon cancer Neg Hx    Esophageal cancer Neg Hx    Rectal cancer Neg Hx    Stomach cancer Neg Hx    Social History   Tobacco Use   Smoking status: Never   Smokeless tobacco: Never  Substance Use Topics   Alcohol use: Yes    Alcohol/week: 3.0 standard drinks of alcohol    Types: 1 Glasses of wine, 2 Cans of beer per week   Allergies  Allergen Reactions   Lisinopril  Cough   Current Outpatient Medications  Medication Sig Dispense Refill   acetaminophen  (TYLENOL ) 500 MG tablet Take 2 tablets (1,000 mg total) by mouth every 6 (six) hours as needed. 100 tablet 2   aspirin  EC 81 MG tablet Take 1 tablet (81 mg total) by mouth at bedtime.     atorvastatin  (LIPITOR) 40 MG tablet Take 1 tablet (40 mg total) by mouth daily. 90 tablet 1   azelastine  (ASTELIN ) 0.1 % nasal spray Place 1 spray  into both nostrils 2 (two) times daily. Use in each nostril as directed 30 mL 12   calcium  carbonate (TUMS - DOSED IN MG ELEMENTAL CALCIUM ) 500 MG chewable tablet Chew 1,000 mg by mouth daily as needed for indigestion or heartburn.     ciprofloxacin  (CIPRO ) 500 MG tablet Take 1 tablet (500 mg total) by mouth 2 (two) times daily for 7 days. 14 tablet 0   clindamycin  (CLEOCIN -T) 1 % lotion Apply topically 2 (two) times daily. (Patient taking differently: Apply 1 Application topically 2 (two) times daily as needed (infected hair follicles).) 60 mL 2   Coenzyme Q10 (COQ10) 200 MG CAPS Take 200 mg by mouth at bedtime.     Cyanocobalamin  (B-12) 1000 MCG CAPS Take 1 tablet by mouth daily.     famotidine (PEPCID) 20 MG tablet Take 20 mg by mouth at bedtime.     fluocinonide  ointment (LIDEX ) 0.05 % Apply 1 application topically daily as needed (for bites). 30 g 3   fluticasone  (FLONASE ) 50 MCG/ACT nasal spray Place 2 sprays into both nostrils daily as needed for allergies.      Ketotifen Fumarate (ALLERGY EYE DROPS OP) Place 1 drop into both eyes daily as needed (allergies).     Magnesium  250 MG TABS Take 250 mg by mouth daily.     metFORMIN  (GLUCOPHAGE ) 500 MG tablet Take 1 tablet (500 mg total) by mouth daily with breakfast. 90 tablet 0   montelukast  (SINGULAIR ) 10 MG tablet Take 1 tablet (10 mg total) by mouth at bedtime. 30 tablet 3   pantoprazole  (PROTONIX ) 40 MG tablet Take 1 tablet (40 mg total) by mouth daily. 90 tablet 1   prednisoLONE  acetate (PRED FORTE ) 1 % ophthalmic suspension Put 6 drops in the rinse bottle and rinse in nose twice daily. 15 mL 5   sodium chloride  (OCEAN) 0.65 % SOLN nasal spray Place 2 sprays into both nostrils as needed for up to 7 days for congestion. 30 mL 2   tamsulosin  (FLOMAX ) 0.4 MG CAPS capsule Take 1 capsule (0.4 mg total) by mouth daily after supper. 90 capsule 1   tirzepatide  (MOUNJARO ) 10 MG/0.5ML Pen Inject 10 mg into the skin once a week. 6 mL 0   valsartan   (  DIOVAN ) 40 MG tablet Take 1 tablet (40 mg total) by mouth daily. 90 tablet 3   Vitamin D , Ergocalciferol , (DRISDOL ) 1.25 MG (50000 UNIT) CAPS capsule TAKE ONE CAPSULE BY MOUTH EVERY 7 DAYS 12 capsule 0   No current facility-administered medications for this visit.   BP 108/69 (BP Location: Left Arm, Patient Position: Sitting, Cuff Size: Large)   Pulse 71   Ht 5' 8 (1.727 m)   Wt 208 lb (94.3 kg)   SpO2 95%   BMI 31.63 kg/m   PHYSICAL EXAM:  BP 108/69 (BP Location: Left Arm, Patient Position: Sitting, Cuff Size: Large)   Pulse 71   Ht 5' 8 (1.727 m)   Wt 208 lb (94.3 kg)   SpO2 95%   BMI 31.63 kg/m    Salient findings CN II-XII intact Anterior rhino with expected diffuse b/l nasal crusting. Turbinates reduced; no epistaxis. No evidence of infection. Nasal endoscopy was indicated to better evaluate the nose and paranasal sinuses, given the patient's history of recent sinus surgery and exam findings, and is detailed below. Prior to proceeding, verbal consent was obtained. No respiratory distress or stridor   Prior to initiating any procedures, risks/benefits/alternatives were explained to the patient and verbal consent obtained. PROCEDURE: Bilateral Diagnostic Rigid Nasal Endoscopy with Bilateral Debridement Pre-procedure diagnosis: Post-operative examination and care after Bilateral Functional Endoscopic Sinus Surgery Post-procedure diagnosis: same Indication: See pre-procedure diagnosis and physical exam above Complications: None apparent EBL: 0 mL Anesthesia: Lidocaine  4% and topical decongestant was topically sprayed in each nasal cavity   Description of Procedure:  Patient was identified. A rigid 30 degree endoscope was utilized to evaluate the sinonasal cavities, mucosa, sinus ostia and turbinates and septum.  Overall, signs of mucosal inflammation are noted. Also noted are post-surgical changes with some crusting and clot within bilateral middle meati bilateral and  sphenoethmoid recess. These were debrided with a 8 Fr suction. After debridement, sinus cavity patency was improved. No adverse synechiae are noted today Right Middle meatus: more patent Right SE Recess: more patent Left MM: more patent Left SE Recess: more patent   ASSESSMENT:  78 y.o. with:  1. Other chronic sinusitis   2. Hypertrophy of both inferior nasal turbinates   3. Nasal congestion   4. S/P FESS (functional endoscopic sinus surgery)    S/p b/l FESS and ITR for b/l likely odontogenic sinusitis with copious purulence during procedure. Recovering.  Sinus cavities debrided. Resume nasal sprays as pre-op Start nasal saline rinses BID with pred-forte drops Given cx, will do 1 week of Ciprofloxacin ; risks including tendon injury discused AP/AC: can resume F/u 1 week  See below regarding exact medications prescribed this encounter including dosages and route: Meds ordered this encounter  Medications   ciprofloxacin  (CIPRO ) 500 MG tablet    Sig: Take 1 tablet (500 mg total) by mouth 2 (two) times daily for 7 days.    Dispense:  14 tablet    Refill:  0   prednisoLONE  acetate (PRED FORTE ) 1 % ophthalmic suspension    Sig: Put 6 drops in the rinse bottle and rinse in nose twice daily.    Dispense:  15 mL    Refill:  5     Thank you for allowing me the opportunity to care for your patient. Please do not hesitate to contact me should you have any other questions.  Sincerely, Eldora Blanch, MD Otolaryngologist (ENT), [facility] Phone: [phone] Fax: [phone]  MDM: low  03/09/2024, 12:52 PM

## 2024-03-13 ENCOUNTER — Ambulatory Visit (INDEPENDENT_AMBULATORY_CARE_PROVIDER_SITE_OTHER): Admitting: Otolaryngology

## 2024-03-13 ENCOUNTER — Encounter (INDEPENDENT_AMBULATORY_CARE_PROVIDER_SITE_OTHER): Payer: Self-pay | Admitting: Otolaryngology

## 2024-03-13 VITALS — BP 133/84 | HR 76 | Ht 68.0 in | Wt 208.0 lb

## 2024-03-13 DIAGNOSIS — Z9889 Other specified postprocedural states: Secondary | ICD-10-CM

## 2024-03-13 DIAGNOSIS — R0981 Nasal congestion: Secondary | ICD-10-CM

## 2024-03-13 DIAGNOSIS — J328 Other chronic sinusitis: Secondary | ICD-10-CM

## 2024-03-13 DIAGNOSIS — J343 Hypertrophy of nasal turbinates: Secondary | ICD-10-CM

## 2024-03-13 DIAGNOSIS — J342 Deviated nasal septum: Secondary | ICD-10-CM

## 2024-03-13 NOTE — Progress Notes (Signed)
 Dear Dr. Antonio Meth, Here is my assessment for our mutual patient, Eric [name]. Thank you for allowing me the opportunity to care for your patient. Please do not hesitate to contact me should you have any other questions. Sincerely, Dr. Eldora Blanch  Otolaryngology Clinic Note  HISTORY: Eric [name] is a 78 y.o. male kindly referred by Dr. Antonio Meth for evaluation of chronic sinusitis  Initial visit (11/2023):  He reports that he had a cold in January 2025, and he recovered from this but then had persistent bilateral discolored drainage and facial pressure bilaterally with foul smelling drainage. He reports that he did have dental pain on the right maxillary tooth for which he had dental procedure but did not resolve his symptoms. He has been on antibiotics (multiple rounds) with Dr. Antonio Meth prescribing him augmentin  and prednisone  as well as claritin , flonase , and astelin . He got some better but his symptoms continue to persist and continues to have foul smelling drainage, nasal congestion and bilateral facial pressure.  He also had a CT done and was referred here. Prior to this, he denies history of frequent sinus infections.   Improvement occurred with antibiotics, and nasal sprays.Allergy testing has not been done.  Denies typical AR symptoms. No previous sinonasal surgery.  Prior has had RAST which showed ragweed, goldenrod, BAHA grass, cats.  He is currently using flonase , astelin , and PO anthistamine and saline spray. Was using rinses prior.  He is a retired PA  --------------------------------------------------------- 01/2024 The patient gave consent to have this visit done by telemedicine / virtual visit, two identifiers were used to identify patient. This is also consent for access the chart and treat the patient via this visit. The patient is located in [place]  I, the provider, am at the office.  We spent 10 minutes together for the visit.  Joined by  telephone  Discussed upcoming procedure. I answered his questions and discussed details about procedure. He has stopped his ASA and Mounjaro . Discussed updated CT --------------------------------------------------------- 03/05/2024 Patient reports some expected bleeding. No fevers. Finishing perioperative abx/steroid. Otherwise no significant issues. --------------------------------------------------------- 03/13/2024 Doing well now, congestion significantly improved; no bleeding, no pressure. Has been rinsing with pred forte  BID and doing flonase . Finished abx.   GLP-1: Mounjaro  AP/AC: ASA 81  Tobacco: no; EtOH: prior  PMHx: HLD, HTN, DM, Multiple Joint Replacement, OSA on CPAP, Palpitations  RADIOGRAPHIC EVALUATION AND INDEPENDENT REVIEW OF OTHER RECORDS: Dr. Antonio Meth (08/29/2023): nasal congestion and yellow nasal drainage for 4 months primarily on right. Allergy testing done - ragweed, goldenrod, cats; occassional afrin, on claritin , flonase , astelin . Dx: Sinusitis;Rx: CT and ref to ENT CT Face 08/29/2023 independently interpreted: bilateral OMC pattern sinusitis with septum deviates right and bilateral inferior turbinate hypertrophy; noted maxillary molars partially into maxillary sinus b/l CBC w/diff: WBC 6.1, Eos 0 CT [facility] 12/14/2023: agree with read, although does not appear to be polyps on right. No nasal mass on left. B/l OMC pattern opacification Cx 02/27/2024: Pan-sensitive Klebsiella Past Medical History:  Diagnosis Date   Alcohol abuse    Allergy    Back pain    Cancer (HCC)    Basal cell cancer  below left eye, had Mohs'   Cataract    forming   Colon polyps    Diverticulosis of colon    DJD (degenerative joint disease)    Dysrhythmia    Palpations from caffiene- reports he was having PACs due to slim fast wth caffeine - had full cardiac w/u and was told  to cut back on caffeine;  now drink decaf bevrerages and no rpeort of recurrence    Enlarged prostate    GERD  (gastroesophageal reflux disease)    Hearing loss    has HAs,   Hip osteoarthritis    Left   History of hay fever    Hx of cardiovascular stress test    Lexiscan  Myoview (04/2013):  No ischemia, EF 61%; low risk   Hyperlipemia    Hypertension    Insulin  resistance    om metfromin    Internal hemorrhoids without mention of complication    Leg edema    Neuroma of foot    left   Obesity    Osteoarthrosis, unspecified whether generalized or localized, unspecified site    Palpitations    Pneumonia    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   Pre-diabetes    Primary localized osteoarthrosis of right shoulder 05/25/2016   Recent weight loss    75lbs   Seasonal allergies    Sleep apnea    CPAP   Thyroid  nodule    calcified thyroid  nodules- bx negative    Tremor of both hands    Past Surgical History:  Procedure Laterality Date   COLONOSCOPY     INGUINAL HERNIA REPAIR Left 03/29/2023   Procedure: OPEN LEFT INGUINAL HERNIA REPAIR WITH MESH;  Surgeon: Kinsinger, Herlene Righter, MD;  Location: [facility];  Service: General;  Laterality: Left;   JOINT REPLACEMENT  2016/04/03   POLYPECTOMY     SHOULDER SURGERY  04-04-07   right   SINUS ENDO W/FUSION Bilateral 02/27/2024   Procedure: BILATERAL FUNCTIONAL ENDOSCOPIC SINUS SURGERY USING COMPUTER-ASSISTED NAVIGATION;  Surgeon: Tobie Eldora NOVAK, MD;  Location: [facility];  Service: ENT;  Laterality: Bilateral;   TONSILLECTOMY     TOTAL HIP ARTHROPLASTY Left 03/28/2017   Procedure: TOTAL HIP ARTHROPLASTY ANTERIOR APPROACH;  Surgeon: Liam Lerner, MD;  Location: [facility];  Service: Orthopedics;  Laterality: Left;   TOTAL HIP ARTHROPLASTY Right 09/25/2018   Procedure: Right Anterior Hip Arthroplasty;  Surgeon: Liam Lerner, MD;  Location: [facility];  Service: Orthopedics;  Laterality: Right;   TOTAL KNEE ARTHROPLASTY Right 10/04/2022   Procedure: RIGHT TOTAL KNEE ARTHROPLASTY;  Surgeon: Liam Lerner, MD;  Location: [facility];  Service:  Orthopedics;  Laterality: Right;   TOTAL SHOULDER ARTHROPLASTY Left 10/14/2015   Procedure: LEFT TOTAL SHOULDER ARTHROPLASTY;  Surgeon: Fonda Olmsted, MD;  Location: [facility];  Service: Orthopedics;  Laterality: Left;   TOTAL SHOULDER ARTHROPLASTY Right 05/25/2016   Procedure: TOTAL SHOULDER ARTHROPLASTY;  Surgeon: Fonda Olmsted, MD;  Location: [facility];  Service: Orthopedics;  Laterality: Right;   TURBINATE REDUCTION Bilateral 02/27/2024   Procedure: BILATERAL INFERIOR TURBINATE REDUCTION;  Surgeon: Tobie Eldora NOVAK, MD;  Location: [facility];  Service: ENT;  Laterality: Bilateral;   Family History  Problem Relation Age of Onset   Arthritis Mother    Cancer Mother 74       breast   Hyperlipidemia Mother    Colon polyps Mother 4   Hypertension Mother    Heart disease Mother    Depression Mother    Anxiety disorder Mother    Obesity Mother    Breast cancer Mother    Heart disease Father        CAD--passed away 04-04-11 age 62   Hyperlipidemia Father    Hypertension Father    Obesity Father    Diabetes Father    Depression Father  Sleep apnea Father    Cancer Sister 44       breast   Breast cancer Sister    Sleep apnea Sister    Cancer Brother 45       prostate   Prostate cancer Brother    Atrial fibrillation Brother    Tremor Paternal Grandfather    Breast cancer Other    Prostate cancer Other    Colon cancer Neg Hx    Esophageal cancer Neg Hx    Rectal cancer Neg Hx    Stomach cancer Neg Hx    Social History   Tobacco Use   Smoking status: Never   Smokeless tobacco: Never  Substance Use Topics   Alcohol use: Yes    Alcohol/week: 3.0 standard drinks of alcohol    Types: 1 Glasses of wine, 2 Cans of beer per week   Allergies  Allergen Reactions   Lisinopril  Cough   Current Outpatient Medications  Medication Sig Dispense Refill   acetaminophen  (TYLENOL ) 500 MG tablet Take 2 tablets (1,000 mg total) by mouth every 6 (six) hours as needed. 100 tablet 2    aspirin  EC 81 MG tablet Take 1 tablet (81 mg total) by mouth at bedtime.     atorvastatin  (LIPITOR) 40 MG tablet Take 1 tablet (40 mg total) by mouth daily. 90 tablet 1   azelastine  (ASTELIN ) 0.1 % nasal spray Place 1 spray into both nostrils 2 (two) times daily. Use in each nostril as directed 30 mL 12   calcium  carbonate (TUMS - DOSED IN MG ELEMENTAL CALCIUM ) 500 MG chewable tablet Chew 1,000 mg by mouth daily as needed for indigestion or heartburn.     clindamycin  (CLEOCIN -T) 1 % lotion Apply topically 2 (two) times daily. (Patient taking differently: Apply 1 Application topically 2 (two) times daily as needed (infected hair follicles).) 60 mL 2   Coenzyme Q10 (COQ10) 200 MG CAPS Take 200 mg by mouth at bedtime.     Cyanocobalamin  (B-12) 1000 MCG CAPS Take 1 tablet by mouth daily.     famotidine (PEPCID) 20 MG tablet Take 20 mg by mouth at bedtime.     fluocinonide  ointment (LIDEX ) 0.05 % Apply 1 application topically daily as needed (for bites). 30 g 3   fluticasone  (FLONASE ) 50 MCG/ACT nasal spray Place 2 sprays into both nostrils daily as needed for allergies.      Ketotifen Fumarate (ALLERGY EYE DROPS OP) Place 1 drop into both eyes daily as needed (allergies).     Magnesium  250 MG TABS Take 250 mg by mouth daily.     metFORMIN  (GLUCOPHAGE ) 500 MG tablet Take 1 tablet (500 mg total) by mouth daily with breakfast. 90 tablet 0   montelukast  (SINGULAIR ) 10 MG tablet Take 1 tablet (10 mg total) by mouth at bedtime. 30 tablet 3   pantoprazole  (PROTONIX ) 40 MG tablet Take 1 tablet (40 mg total) by mouth daily. 90 tablet 1   prednisoLONE  acetate (PRED FORTE ) 1 % ophthalmic suspension Put 6 drops in the rinse bottle and rinse in nose twice daily. 15 mL 5   sodium chloride  (OCEAN) 0.65 % SOLN nasal spray Place 2 sprays into both nostrils as needed for up to 7 days for congestion. 30 mL 2   tamsulosin  (FLOMAX ) 0.4 MG CAPS capsule Take 1 capsule (0.4 mg total) by mouth daily after supper. 90 capsule 1    tirzepatide  (MOUNJARO ) 10 MG/0.5ML Pen Inject 10 mg into the skin once a week. 6 mL 0  valsartan  (DIOVAN ) 40 MG tablet Take 1 tablet (40 mg total) by mouth daily. 90 tablet 3   Vitamin D , Ergocalciferol , (DRISDOL ) 1.25 MG (50000 UNIT) CAPS capsule TAKE ONE CAPSULE BY MOUTH EVERY 7 DAYS 12 capsule 0   No current facility-administered medications for this visit.   BP 133/84 (BP Location: Left Arm, Patient Position: Sitting, Cuff Size: Large)   Pulse 76   Ht 5' 8 (1.727 m)   Wt 208 lb (94.3 kg)   SpO2 98%   BMI 31.63 kg/m   PHYSICAL EXAM:  BP 133/84 (BP Location: Left Arm, Patient Position: Sitting, Cuff Size: Large)   Pulse 76   Ht 5' 8 (1.727 m)   Wt 208 lb (94.3 kg)   SpO2 98%   BMI 31.63 kg/m    Salient findings CN II-XII intact Anterior rhino with expected diffuse nasal crusting; Turbinates reduced; no epistaxis. No evidence of infection. Nasal endoscopy was indicated to better evaluate the nose and paranasal sinuses, given the patient's history of recent sinus surgery and exam findings, and is detailed below. Prior to proceeding, verbal consent was obtained. No respiratory distress or stridor   Prior to initiating any procedures, risks/benefits/alternatives were explained to the patient and verbal consent obtained. PROCEDURE: Bilateral Diagnostic Rigid Nasal Endoscopy with Bilateral Debridement Pre-procedure diagnosis: Post-operative examination and care after Bilateral Functional Endoscopic Sinus Surgery Post-procedure diagnosis: same Indication: See pre-procedure diagnosis and physical exam above Complications: None apparent EBL: 0 mL Anesthesia: Lidocaine  4% and topical decongestant was topically sprayed in each nasal cavity   Description of Procedure:  Patient was identified. A rigid 30 degree endoscope was utilized to evaluate the sinonasal cavities, mucosa, sinus ostia and turbinates and septum.  Overall, signs of mucosal inflammation are noted. Also noted are  post-surgical changes with some crusting and thick mucoid secretions within bilateral middle meati bilateral and sphenoethmoid recess. These were debrided with a 8 Fr suction. After debridement, sinus cavity patency was improved. No adverse synechiae are noted today Right Middle meatus: more patent Right SE Recess: more patent Left MM: more patent Left SE Recess: more patent   ASSESSMENT:  78 y.o. with:  1. Other chronic sinusitis   2. Hypertrophy of both inferior nasal turbinates   3. Nasal congestion   4. S/P FESS (functional endoscopic sinus surgery)   5. Nasal septal deviation    S/p b/l FESS and ITR for b/l likely odontogenic sinusitis with copious purulence during procedure. Recovering, now improving  Sinus cavities debrided. Continue nasal sprays as pre-op Continue nasal saline rinses BID; can stop pred forte  AP/AC: can resume F/u 2-3 weeks  See below regarding exact medications prescribed this encounter including dosages and route: No orders of the defined types were placed in this encounter.    Thank you for allowing me the opportunity to care for your patient. Please do not hesitate to contact me should you have any other questions.  Sincerely, Eldora Blanch, MD Otolaryngologist (ENT), [facility] Phone: [phone] Fax: [phone]  MDM: low, low, low  03/13/2024, 2:12 PM

## 2024-03-30 ENCOUNTER — Encounter: Payer: Self-pay | Admitting: Family Medicine

## 2024-03-30 ENCOUNTER — Other Ambulatory Visit: Payer: Self-pay

## 2024-03-30 DIAGNOSIS — K219 Gastro-esophageal reflux disease without esophagitis: Secondary | ICD-10-CM

## 2024-03-30 MED ORDER — PANTOPRAZOLE SODIUM 40 MG PO TBEC: 40.0000 mg | DELAYED_RELEASE_TABLET | Freq: Every day | ORAL | 1 refills | Status: AC

## 2024-04-03 ENCOUNTER — Ambulatory Visit (INDEPENDENT_AMBULATORY_CARE_PROVIDER_SITE_OTHER): Payer: Self-pay | Admitting: Family Medicine

## 2024-04-03 ENCOUNTER — Encounter (INDEPENDENT_AMBULATORY_CARE_PROVIDER_SITE_OTHER): Payer: Self-pay | Admitting: Family Medicine

## 2024-04-03 VITALS — BP 99/63 | HR 51 | Temp 97.5°F | Ht 67.0 in | Wt 200.0 lb

## 2024-04-03 DIAGNOSIS — Z6831 Body mass index (BMI) 31.0-31.9, adult: Secondary | ICD-10-CM

## 2024-04-03 DIAGNOSIS — I10 Essential (primary) hypertension: Secondary | ICD-10-CM | POA: Diagnosis not present

## 2024-04-03 DIAGNOSIS — R531 Weakness: Secondary | ICD-10-CM

## 2024-04-03 NOTE — Progress Notes (Signed)
 "  Office: [phone]  /  Fax: [phone]  WEIGHT SUMMARY AND BIOMETRICS  Anthropometric Measurements Height: 5' 7 (1.702 m) ((rechecked height 04/03/2024)) Weight: 200 lb (90.7 kg) BMI (Calculated): 31.32 Weight at Last Visit: 198 lb Weight Lost Since Last Visit: 0 Weight Gained Since Last Visit: 2 lb Starting Weight: 288 lb Total Weight Loss (lbs): 88 lb (39.9 kg) Peak Weight: 288 lb   Body Composition  Body Fat %: 37.7 % Fat Mass (lbs): 75.4 lbs Muscle Mass (lbs): 118.4 lbs Total Body Water  (lbs): 94.2 lbs Visceral Fat Rating : 22   Other Clinical Data Fasting: yes Labs: no Today's Visit #: 54 Starting Date: 12/14/17    Chief Complaint: OBESITY   Discussed the use of AI scribe software for clinical note transcription with the patient, who gave verbal consent to proceed.  History of Present Illness Eric [name] is a 79 year old male with obesity and hypertension who presents for obesity treatment and progress assessment.  He is adhering to a category three eating plan about twenty percent of the time and engages in physical therapy for strengthening, performing ninety minutes two days a week. Despite these efforts, he has gained two pounds over the last month, attributing this to the holiday season, and is working on getting back on track. He is also attempting to increase his protein intake to one hundred grams per day, primarily through two protein drinks providing sixty grams total.  He is being treated for hypertension with Diovan  (valsartan ) at a reduced dose of forty milligrams daily, down from eighty milligrams. No symptoms of hypotension such as lightheadedness or dizziness are present. He is focusing on diet, exercise, and weight loss to manage his hypertension.  He recently underwent sinus surgery and has been using a nasal spray post-operatively. He was on steroids for a period but has been off them for about a week. He reports increased  hunger, which he finds unusual, and has not been getting out much, although he continues with physical therapy.  He has concerns about testosterone levels, noting decreased physical activity and increased time spent sitting. He has not had testosterone levels tested but is considering it due to symptoms like feeling cold, which he attributes to weight loss and reduced insulation. He experiences frequent urination, sometimes up to ten times a day, which he associates with hydration and dietary factors like salt intake. He drinks decaffeinated tea to maintain hydration.  His social activities include plans to travel to [place]  and [place]  in the coming months. He is also working on meal preparation strategies to improve his diet, including trying new recipes that are high in protein and easy to prepare.      PHYSICAL EXAM:  Blood pressure 99/63, pulse (!) 51, temperature (!) 97.5 F (36.4 C), height 5' 7 (1.702 m), weight 200 lb (90.7 kg), SpO2 97%. Body mass index is 31.32 kg/m.  DIAGNOSTIC DATA REVIEWED:  BMET    Component Value Date/Time   NA 142 02/23/2024 1035   K 4.2 02/23/2024 1035   CL 102 02/23/2024 1035   CO2 22 02/23/2024 1035   GLUCOSE 82 02/23/2024 1035   GLUCOSE 111 (H) 02/20/2024 1200   BUN 19 02/23/2024 1035   CREATININE 0.92 02/23/2024 1035   CREATININE 0.93 04/23/2016 1633   CALCIUM  9.5 02/23/2024 1035   GFRNONAA >60 02/20/2024 1200   GFRAA 74 10/22/2019 1243   Lab Results  Component Value Date   HGBA1C 5.2 02/23/2024   HGBA1C 5.5  03/05/2015   Lab Results  Component Value Date   INSULIN  11.5 02/23/2024   INSULIN  21.3 12/14/2017   Lab Results  Component Value Date   TSH 0.668 02/23/2024   CBC    Component Value Date/Time   WBC 6.1 03/01/2023 1049   WBC 6.6 09/22/2022 0902   RBC 4.86 03/01/2023 1049   RBC 4.99 09/22/2022 0902   HGB 15.1 03/01/2023 1049   HCT 46.3 03/01/2023 1049   PLT 191 03/01/2023 1049   MCV 95 03/01/2023 1049   MCH 31.1  03/01/2023 1049   MCH 31.1 09/22/2022 0902   MCHC 32.6 03/01/2023 1049   MCHC 32.4 09/22/2022 0902   RDW 13.2 03/01/2023 1049   Iron Studies No results found for: IRON, TIBC, FERRITIN, IRONPCTSAT Lipid Panel     Component Value Date/Time   CHOL 110 02/23/2024 1035   TRIG 50 02/23/2024 1035   HDL 51 02/23/2024 1035   CHOLHDL 3 09/09/2022 0927   VLDL 13.6 09/09/2022 0927   LDLCALC 47 02/23/2024 1035   Hepatic Function Panel     Component Value Date/Time   PROT 6.3 02/23/2024 1035   ALBUMIN 4.4 02/23/2024 1035   AST 17 02/23/2024 1035   ALT 27 02/23/2024 1035   ALKPHOS 78 02/23/2024 1035   BILITOT 1.0 02/23/2024 1035   BILIDIR 0.2 06/07/2014 0909      Component Value Date/Time   TSH 0.668 02/23/2024 1035   Nutritional Lab Results  Component Value Date   VD25OH 53.5 02/23/2024   VD25OH 85.3 06/28/2023   VD25OH 67.8 03/01/2023     Assessment and Plan Assessment & Plan Morbid obesity due to excess calories Morbid obesity with recent weight gain of 2 pounds over the last month, attributed to holiday period. Currently following category three eating plan 20% of the time. Engaged in physical therapy for strengthening, attending 90 minutes twice a week. Struggling with protein intake, currently consuming 60 grams from protein drinks. Discussed challenges of meeting protein goals and benefits of real food over supplements. Provided recipes with high protein content to aid in meeting dietary goals. - Continue physical therapy sessions twice a week. - Implement provided high-protein recipes to increase protein intake. - Continue cat 3 plan overall.  Essential hypertension Well controlled with Diovan  40 mg daily. Blood pressure is 99/63 mmHg. No symptoms of hypotension reported. Heart rate is 51 bpm. - Continue Diovan  40 mg daily. - Monitor blood pressure and symptoms of hypotension. - Consult urologist for testosterone evaluation if symptoms worsen.  Weakness with  Muscle loss Discussed potential for low testosterone affecting muscle building, but no immediate intervention planned.  - Advised to consult urologist for testosterone evaluation if symptoms worsen. - Continue strengthening exercise and protein intake as directed by PT      Patients who are on anti-obesity medications are counseled on the importance of maintaining healthy lifestyle habits, including balanced nutrition, regular physical activity, and behavioral modifications,  Medication is an adjunct to, not a replacement for, lifestyle changes and that the long-term success and weight maintenance depend on continued adherence to these strategies.   Caroline was informed of the importance of frequent follow up visits to maximize his success with intensive lifestyle modifications for his obesity and obesity related health conditions as recommended by USPSTF and CMS guidelines  Louann Penton, MD   "

## 2024-04-09 ENCOUNTER — Other Ambulatory Visit (INDEPENDENT_AMBULATORY_CARE_PROVIDER_SITE_OTHER): Payer: Self-pay | Admitting: Family Medicine

## 2024-04-09 ENCOUNTER — Encounter (INDEPENDENT_AMBULATORY_CARE_PROVIDER_SITE_OTHER): Payer: Self-pay | Admitting: Family Medicine

## 2024-04-12 ENCOUNTER — Encounter (INDEPENDENT_AMBULATORY_CARE_PROVIDER_SITE_OTHER): Payer: Self-pay | Admitting: Otolaryngology

## 2024-04-12 ENCOUNTER — Ambulatory Visit (INDEPENDENT_AMBULATORY_CARE_PROVIDER_SITE_OTHER): Admitting: Otolaryngology

## 2024-04-12 VITALS — BP 107/74 | HR 69 | Ht 67.0 in | Wt 200.0 lb

## 2024-04-12 DIAGNOSIS — J343 Hypertrophy of nasal turbinates: Secondary | ICD-10-CM

## 2024-04-12 DIAGNOSIS — R0981 Nasal congestion: Secondary | ICD-10-CM

## 2024-04-12 DIAGNOSIS — Z9889 Other specified postprocedural states: Secondary | ICD-10-CM | POA: Diagnosis not present

## 2024-04-12 DIAGNOSIS — J328 Other chronic sinusitis: Secondary | ICD-10-CM

## 2024-04-12 NOTE — Progress Notes (Signed)
 Dear Dr. Antonio Meth, Here is my assessment for our mutual patient, Eric [name]. Thank you for allowing me the opportunity to care for your patient. Please do not hesitate to contact me should you have any other questions. Sincerely, Dr. Eldora Blanch  Otolaryngology Clinic Note  HISTORY: Eric [name] is a 79 y.o. male kindly referred by Dr. Antonio Meth for evaluation of chronic sinusitis  Initial visit (11/2023):  He reports that he had a cold in January 2025, and he recovered from this but then had persistent bilateral discolored drainage and facial pressure bilaterally with foul smelling drainage. He reports that he did have dental pain on the right maxillary tooth for which he had dental procedure but did not resolve his symptoms. He has been on antibiotics (multiple rounds) with Dr. Antonio Meth prescribing him augmentin  and prednisone  as well as claritin , flonase , and astelin . He got some better but his symptoms continue to persist and continues to have foul smelling drainage, nasal congestion and bilateral facial pressure.  He also had a CT done and was referred here. Prior to this, he denies history of frequent sinus infections.   Improvement occurred with antibiotics, and nasal sprays.Allergy testing has not been done.  Denies typical AR symptoms. No previous sinonasal surgery.  Prior has had RAST which showed ragweed, goldenrod, BAHA grass, cats.  He is currently using flonase , astelin , and PO anthistamine and saline spray. Was using rinses prior.  He is a retired PA  --------------------------------------------------------- 01/2024 The patient gave consent to have this visit done by telemedicine / virtual visit, two identifiers were used to identify patient. This is also consent for access the chart and treat the patient via this visit. The patient is located in [place]  I, the provider, am at the office.  We spent 10 minutes together for the visit.  Joined by  telephone  Discussed upcoming procedure. I answered his questions and discussed details about procedure. He has stopped his ASA and Mounjaro . Discussed updated CT --------------------------------------------------------- 03/05/2024 Patient reports some expected bleeding. No fevers. Finishing perioperative abx/steroid. Otherwise no significant issues. --------------------------------------------------------- 03/13/2024 Doing well now, congestion significantly improved; no bleeding, no pressure. Has been rinsing with pred forte  BID and doing flonase . Finished abx. --------------------------------------------------------- 04/12/2024 Still doing well, congestion doing well. No bleeding anymore. Using daily rinses and flonase . No exacerbations    GLP-1: Mounjaro  AP/AC: ASA 81  Tobacco: no; EtOH: prior  PMHx: HLD, HTN, DM, Multiple Joint Replacement, OSA on CPAP, Palpitations  RADIOGRAPHIC EVALUATION AND INDEPENDENT REVIEW OF OTHER RECORDS: Dr. Antonio Meth (08/29/2023): nasal congestion and yellow nasal drainage for 4 months primarily on right. Allergy testing done - ragweed, goldenrod, cats; occassional afrin, on claritin , flonase , astelin . Dx: Sinusitis;Rx: CT and ref to ENT CT Face 08/29/2023 independently interpreted: bilateral OMC pattern sinusitis with septum deviates right and bilateral inferior turbinate hypertrophy; noted maxillary molars partially into maxillary sinus b/l CBC w/diff: WBC 6.1, Eos 0 CT [facility] 12/14/2023: agree with read, although does not appear to be polyps on right. No nasal mass on left. B/l OMC pattern opacification Cx 02/27/2024: Pan-sensitive Klebsiella Past Medical History:  Diagnosis Date   Alcohol abuse    Allergy    Back pain    Cancer (HCC)    Basal cell cancer  below left eye, had Mohs'   Cataract    forming   Colon polyps    Diverticulosis of colon    DJD (degenerative joint disease)    Dysrhythmia    Palpations from caffiene-  reports he was having  PACs due to slim fast wth caffeine - had full cardiac w/u and was told to cut back on caffeine;  now drink decaf bevrerages and no rpeort of recurrence    Enlarged prostate    GERD (gastroesophageal reflux disease)    Hearing loss    has HAs,   Hip osteoarthritis    Left   History of hay fever    Hx of cardiovascular stress test    Lexiscan  Myoview May 13, 2013):  No ischemia, EF 61%; low risk   Hyperlipemia    Hypertension    Insulin  resistance    om metfromin    Internal hemorrhoids without mention of complication    Leg edema    Neuroma of foot    left   Obesity    Osteoarthrosis, unspecified whether generalized or localized, unspecified site    Palpitations    Pneumonia    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   Pre-diabetes    Primary localized osteoarthrosis of right shoulder 05/25/2016   Recent weight loss    75lbs   Seasonal allergies    Sleep apnea    CPAP   Thyroid  nodule    calcified thyroid  nodules- bx negative    Tremor of both hands    Past Surgical History:  Procedure Laterality Date   COLONOSCOPY     INGUINAL HERNIA REPAIR Left 03/29/2023   Procedure: OPEN LEFT INGUINAL HERNIA REPAIR WITH MESH;  Surgeon: Kinsinger, Herlene Righter, MD;  Location: [facility];  Service: General;  Laterality: Left;   JOINT REPLACEMENT  May 13, 2016   POLYPECTOMY     SHOULDER SURGERY  May 14, 2007   right   SINUS ENDO W/FUSION Bilateral 02/27/2024   Procedure: BILATERAL FUNCTIONAL ENDOSCOPIC SINUS SURGERY USING COMPUTER-ASSISTED NAVIGATION;  Surgeon: Tobie Eldora NOVAK, MD;  Location: [facility];  Service: ENT;  Laterality: Bilateral;   TONSILLECTOMY     TOTAL HIP ARTHROPLASTY Left 03/28/2017   Procedure: TOTAL HIP ARTHROPLASTY ANTERIOR APPROACH;  Surgeon: Liam Lerner, MD;  Location: [facility];  Service: Orthopedics;  Laterality: Left;   TOTAL HIP ARTHROPLASTY Right 09/25/2018   Procedure: Right Anterior Hip Arthroplasty;  Surgeon: Liam Lerner, MD;  Location: [facility];  Service:  Orthopedics;  Laterality: Right;   TOTAL KNEE ARTHROPLASTY Right 10/04/2022   Procedure: RIGHT TOTAL KNEE ARTHROPLASTY;  Surgeon: Liam Lerner, MD;  Location: [facility];  Service: Orthopedics;  Laterality: Right;   TOTAL SHOULDER ARTHROPLASTY Left 10/14/2015   Procedure: LEFT TOTAL SHOULDER ARTHROPLASTY;  Surgeon: Fonda Olmsted, MD;  Location: [facility];  Service: Orthopedics;  Laterality: Left;   TOTAL SHOULDER ARTHROPLASTY Right 05/25/2016   Procedure: TOTAL SHOULDER ARTHROPLASTY;  Surgeon: Fonda Olmsted, MD;  Location: [facility];  Service: Orthopedics;  Laterality: Right;   TURBINATE REDUCTION Bilateral 02/27/2024   Procedure: BILATERAL INFERIOR TURBINATE REDUCTION;  Surgeon: Tobie Eldora NOVAK, MD;  Location: [facility];  Service: ENT;  Laterality: Bilateral;   Family History  Problem Relation Age of Onset   Arthritis Mother    Cancer Mother 46       breast   Hyperlipidemia Mother    Colon polyps Mother 4   Hypertension Mother    Heart disease Mother    Depression Mother    Anxiety disorder Mother    Obesity Mother    Breast cancer Mother    Heart disease Father        CAD--passed away May 14, 2011 age 16   Hyperlipidemia Father    Hypertension  Father    Obesity Father    Diabetes Father    Depression Father    Sleep apnea Father    Cancer Sister 70       breast   Breast cancer Sister    Sleep apnea Sister    Cancer Brother 48       prostate   Prostate cancer Brother    Atrial fibrillation Brother    Tremor Paternal Grandfather    Breast cancer Other    Prostate cancer Other    Colon cancer Neg Hx    Esophageal cancer Neg Hx    Rectal cancer Neg Hx    Stomach cancer Neg Hx    Social History   Tobacco Use   Smoking status: Never   Smokeless tobacco: Never  Substance Use Topics   Alcohol use: Yes    Alcohol/week: 3.0 standard drinks of alcohol    Types: 1 Glasses of wine, 2 Cans of beer per week   Allergies  Allergen Reactions   Lisinopril  Cough   Current  Outpatient Medications  Medication Sig Dispense Refill   acetaminophen  (TYLENOL ) 500 MG tablet Take 2 tablets (1,000 mg total) by mouth every 6 (six) hours as needed. 100 tablet 2   aspirin  EC 81 MG tablet Take 1 tablet (81 mg total) by mouth at bedtime.     atorvastatin  (LIPITOR) 40 MG tablet Take 1 tablet (40 mg total) by mouth daily. 90 tablet 1   azelastine  (ASTELIN ) 0.1 % nasal spray Place 1 spray into both nostrils 2 (two) times daily. Use in each nostril as directed 30 mL 12   calcium  carbonate (TUMS - DOSED IN MG ELEMENTAL CALCIUM ) 500 MG chewable tablet Chew 1,000 mg by mouth daily as needed for indigestion or heartburn.     clindamycin  (CLEOCIN -T) 1 % lotion Apply topically 2 (two) times daily. (Patient taking differently: Apply 1 Application topically 2 (two) times daily as needed (infected hair follicles).) 60 mL 2   Coenzyme Q10 (COQ10) 200 MG CAPS Take 200 mg by mouth at bedtime.     Cyanocobalamin  (B-12) 1000 MCG CAPS Take 1 tablet by mouth daily.     famotidine (PEPCID) 20 MG tablet Take 20 mg by mouth at bedtime.     fluocinonide  ointment (LIDEX ) 0.05 % Apply 1 application topically daily as needed (for bites). 30 g 3   fluticasone  (FLONASE ) 50 MCG/ACT nasal spray Place 2 sprays into both nostrils daily as needed for allergies.      Ketotifen Fumarate (ALLERGY EYE DROPS OP) Place 1 drop into both eyes daily as needed (allergies).     Magnesium  250 MG TABS Take 250 mg by mouth daily.     metFORMIN  (GLUCOPHAGE ) 500 MG tablet Take 1 tablet (500 mg total) by mouth daily with breakfast. 90 tablet 0   montelukast  (SINGULAIR ) 10 MG tablet Take 1 tablet (10 mg total) by mouth at bedtime. 30 tablet 3   pantoprazole  (PROTONIX ) 40 MG tablet Take 1 tablet (40 mg total) by mouth daily. 90 tablet 1   prednisoLONE  acetate (PRED FORTE ) 1 % ophthalmic suspension Put 6 drops in the rinse bottle and rinse in nose twice daily. 15 mL 5   sodium chloride  (OCEAN) 0.65 % SOLN nasal spray Place 2 sprays  into both nostrils as needed for up to 7 days for congestion. 30 mL 2   tamsulosin  (FLOMAX ) 0.4 MG CAPS capsule Take 1 capsule (0.4 mg total) by mouth daily after supper. 90 capsule 1  tirzepatide  (MOUNJARO ) 10 MG/0.5ML Pen INJECT 10 MG UNDER THE SKIN WEEKLY 6 mL 0   valsartan  (DIOVAN ) 40 MG tablet Take 1 tablet (40 mg total) by mouth daily. 90 tablet 3   Vitamin D , Ergocalciferol , (DRISDOL ) 1.25 MG (50000 UNIT) CAPS capsule TAKE ONE CAPSULE BY MOUTH EVERY 7 DAYS 12 capsule 0   No current facility-administered medications for this visit.   BP 107/74 (BP Location: Left Arm, Patient Position: Sitting, Cuff Size: Large)   Pulse 69   Ht 5' 7 (1.702 m)   Wt 200 lb (90.7 kg)   SpO2 94%   BMI 31.32 kg/m   PHYSICAL EXAM:  BP 107/74 (BP Location: Left Arm, Patient Position: Sitting, Cuff Size: Large)   Pulse 69   Ht 5' 7 (1.702 m)   Wt 200 lb (90.7 kg)   SpO2 94%   BMI 31.32 kg/m    Salient findings CN II-XII intact Anterior rhino with improved crusting, but still persistent; Turbinates reduced; no epistaxis. No evidence of infection. Nasal endoscopy was indicated to better evaluate the nose and paranasal sinuses, given the patient's history of recent sinus surgery and exam findings, and is detailed below. Prior to proceeding, verbal consent was obtained. No respiratory distress or stridor   Prior to initiating any procedures, risks/benefits/alternatives were explained to the patient and verbal consent obtained. PROCEDURE: Bilateral Diagnostic Rigid Nasal Endoscopy with Bilateral Debridement Pre-procedure diagnosis: Post-operative examination and care after Bilateral Functional Endoscopic Sinus Surgery Post-procedure diagnosis: same Indication: See pre-procedure diagnosis and physical exam above Complications: None apparent EBL: 0 mL Anesthesia: Lidocaine  4% and topical decongestant was topically sprayed in each nasal cavity   Description of Procedure:  Patient was identified. A  rigid 30 degree endoscope was utilized to evaluate the sinonasal cavities, mucosa, sinus ostia and turbinates and septum.  Overall, signs of mucosal inflammation are noted. Also noted are post-surgical changes with some crusting and thick mucoid secretions within bilateral middle meati bilateral and sphenoethmoid recess which are improving but persistent. These were debrided with a 8 Fr suction. After debridement, sinus cavity patency was improved. No adverse synechiae are noted today Right Middle meatus: more patent Right SE Recess: more patent Left MM: more patent Left SE Recess: more patent   ASSESSMENT:  79 y.o. with:  1. Other chronic sinusitis   2. Hypertrophy of both inferior nasal turbinates   3. Nasal congestion   4. S/P FESS (functional endoscopic sinus surgery)    S/p b/l FESS and ITR for b/l likely odontogenic sinusitis with copious purulence during procedure. Recovering, now improving and turning a corner  Sinus cavities debrided. Continue nasal sprays as pre-op Continue nasal saline rinses daily AP/AC: can resume F/u 2 months  See below regarding exact medications prescribed this encounter including dosages and route: No orders of the defined types were placed in this encounter.    Thank you for allowing me the opportunity to care for your patient. Please do not hesitate to contact me should you have any other questions.  Sincerely, Eldora Blanch, MD Otolaryngologist (ENT), [facility] Phone: [phone] Fax: [phone]  MDM: low, low, low  04/12/2024, 9:51 AM

## 2024-04-25 ENCOUNTER — Encounter: Payer: Self-pay | Admitting: Family Medicine

## 2024-04-26 NOTE — Telephone Encounter (Signed)
 Pt is wanting to reduce Diovan  from 80mg  to 40mg . Okay to send new prescription?

## 2024-04-27 ENCOUNTER — Other Ambulatory Visit: Payer: Self-pay | Admitting: Family

## 2024-04-27 DIAGNOSIS — I10 Essential (primary) hypertension: Secondary | ICD-10-CM

## 2024-04-27 MED ORDER — VALSARTAN 40 MG PO TABS: 40.0000 mg | ORAL_TABLET | Freq: Every day | ORAL | 3 refills | Status: AC

## 2024-05-10 ENCOUNTER — Ambulatory Visit (INDEPENDENT_AMBULATORY_CARE_PROVIDER_SITE_OTHER): Admitting: Family Medicine

## 2024-05-14 ENCOUNTER — Ambulatory Visit (INDEPENDENT_AMBULATORY_CARE_PROVIDER_SITE_OTHER): Admitting: Family Medicine

## 2024-06-11 ENCOUNTER — Ambulatory Visit (INDEPENDENT_AMBULATORY_CARE_PROVIDER_SITE_OTHER): Admitting: Family Medicine

## 2024-06-13 ENCOUNTER — Ambulatory Visit (INDEPENDENT_AMBULATORY_CARE_PROVIDER_SITE_OTHER): Admitting: Otolaryngology

## 2024-07-11 ENCOUNTER — Ambulatory Visit (INDEPENDENT_AMBULATORY_CARE_PROVIDER_SITE_OTHER): Admitting: Family Medicine

## 2024-07-12 ENCOUNTER — Ambulatory Visit: Admitting: Neurology

## 2024-07-12 ENCOUNTER — Ambulatory Visit: Admitting: Family Medicine

## 2024-09-04 ENCOUNTER — Ambulatory Visit: Admitting: Family Medicine

## 2024-09-13 ENCOUNTER — Ambulatory Visit
